# Patient Record
Sex: Female | Born: 1935
Health system: Southern US, Community
[De-identification: ages and names within clinical notes are randomized; demographics above are authoritative.]

## PROBLEM LIST (undated history)

## (undated) DIAGNOSIS — E785 Hyperlipidemia, unspecified: Secondary | ICD-10-CM

## (undated) DIAGNOSIS — K5790 Diverticulosis of intestine, part unspecified, without perforation or abscess without bleeding: Secondary | ICD-10-CM

## (undated) DIAGNOSIS — F419 Anxiety disorder, unspecified: Secondary | ICD-10-CM

## (undated) DIAGNOSIS — K222 Esophageal obstruction: Secondary | ICD-10-CM

## (undated) DIAGNOSIS — R112 Nausea with vomiting, unspecified: Secondary | ICD-10-CM

## (undated) DIAGNOSIS — F32A Depression, unspecified: Secondary | ICD-10-CM

## (undated) DIAGNOSIS — K7689 Other specified diseases of liver: Secondary | ICD-10-CM

## (undated) DIAGNOSIS — Z86010 Personal history of colonic polyps: Secondary | ICD-10-CM

## (undated) DIAGNOSIS — K279 Peptic ulcer, site unspecified, unspecified as acute or chronic, without hemorrhage or perforation: Secondary | ICD-10-CM

## (undated) DIAGNOSIS — T7840XA Allergy, unspecified, initial encounter: Secondary | ICD-10-CM

## (undated) DIAGNOSIS — R519 Headache, unspecified: Secondary | ICD-10-CM

## (undated) DIAGNOSIS — R51 Headache: Secondary | ICD-10-CM

## (undated) DIAGNOSIS — K589 Irritable bowel syndrome without diarrhea: Secondary | ICD-10-CM

## (undated) DIAGNOSIS — IMO0001 Reserved for inherently not codable concepts without codable children: Secondary | ICD-10-CM

## (undated) DIAGNOSIS — J449 Chronic obstructive pulmonary disease, unspecified: Secondary | ICD-10-CM

## (undated) DIAGNOSIS — E538 Deficiency of other specified B group vitamins: Secondary | ICD-10-CM

## (undated) DIAGNOSIS — Z9889 Other specified postprocedural states: Secondary | ICD-10-CM

## (undated) DIAGNOSIS — I350 Nonrheumatic aortic (valve) stenosis: Secondary | ICD-10-CM

## (undated) DIAGNOSIS — D649 Anemia, unspecified: Secondary | ICD-10-CM

## (undated) DIAGNOSIS — K449 Diaphragmatic hernia without obstruction or gangrene: Secondary | ICD-10-CM

## (undated) DIAGNOSIS — F329 Major depressive disorder, single episode, unspecified: Secondary | ICD-10-CM

## (undated) DIAGNOSIS — R7611 Nonspecific reaction to tuberculin skin test without active tuberculosis: Secondary | ICD-10-CM

## (undated) DIAGNOSIS — M1990 Unspecified osteoarthritis, unspecified site: Secondary | ICD-10-CM

## (undated) DIAGNOSIS — F1020 Alcohol dependence, uncomplicated: Secondary | ICD-10-CM

## (undated) HISTORY — DX: Hyperlipidemia, unspecified: E78.5

## (undated) HISTORY — DX: Nonrheumatic aortic (valve) stenosis: I35.0

## (undated) HISTORY — DX: Nonspecific reaction to tuberculin skin test without active tuberculosis: R76.11

## (undated) HISTORY — DX: Diverticulosis of intestine, part unspecified, without perforation or abscess without bleeding: K57.90

## (undated) HISTORY — PX: BREAST BIOPSY: SHX20

## (undated) HISTORY — PX: VAGOTOMY AND PYLOROPLASTY: SHX831

## (undated) HISTORY — PX: ABDOMINAL HYSTERECTOMY: SHX81

## (undated) HISTORY — PX: UPPER GI ENDOSCOPY: SHX6162

## (undated) HISTORY — PX: APPENDECTOMY: SHX54

## (undated) HISTORY — DX: Diaphragmatic hernia without obstruction or gangrene: K44.9

## (undated) HISTORY — PX: BARTHOLIN GLAND CYST EXCISION: SHX565

## (undated) HISTORY — DX: Anemia, unspecified: D64.9

## (undated) HISTORY — DX: Other specified diseases of liver: K76.89

## (undated) HISTORY — DX: Esophageal obstruction: K22.2

## (undated) HISTORY — PX: COLONOSCOPY W/ POLYPECTOMY: SHX1380

## (undated) HISTORY — PX: CATARACT EXTRACTION W/ INTRAOCULAR LENS  IMPLANT, BILATERAL: SHX1307

## (undated) HISTORY — DX: Deficiency of other specified B group vitamins: E53.8

## (undated) HISTORY — DX: Allergy, unspecified, initial encounter: T78.40XA

## (undated) HISTORY — DX: Personal history of colonic polyps: Z86.010

## (undated) HISTORY — PX: COLONOSCOPY: SHX174

## (undated) HISTORY — DX: Irritable bowel syndrome, unspecified: K58.9

## (undated) HISTORY — DX: Peptic ulcer, site unspecified, unspecified as acute or chronic, without hemorrhage or perforation: K27.9

---

## 1983-06-17 DIAGNOSIS — K449 Diaphragmatic hernia without obstruction or gangrene: Secondary | ICD-10-CM

## 1983-06-17 HISTORY — DX: Diaphragmatic hernia without obstruction or gangrene: K44.9

## 1998-01-02 ENCOUNTER — Other Ambulatory Visit: Admission: RE | Admit: 1998-01-02 | Discharge: 1998-01-02 | Payer: Self-pay | Admitting: Internal Medicine

## 1998-01-12 ENCOUNTER — Other Ambulatory Visit: Admission: RE | Admit: 1998-01-12 | Discharge: 1998-01-12 | Payer: Self-pay | Admitting: Internal Medicine

## 1999-02-28 ENCOUNTER — Other Ambulatory Visit: Admission: RE | Admit: 1999-02-28 | Discharge: 1999-02-28 | Payer: Self-pay | Admitting: Internal Medicine

## 2000-02-24 ENCOUNTER — Other Ambulatory Visit: Admission: RE | Admit: 2000-02-24 | Discharge: 2000-02-24 | Payer: Self-pay | Admitting: Internal Medicine

## 2000-09-24 ENCOUNTER — Encounter: Admission: RE | Admit: 2000-09-24 | Discharge: 2000-12-23 | Payer: Self-pay | Admitting: Internal Medicine

## 2001-03-08 ENCOUNTER — Other Ambulatory Visit: Admission: RE | Admit: 2001-03-08 | Discharge: 2001-03-08 | Payer: Self-pay | Admitting: Internal Medicine

## 2002-03-21 ENCOUNTER — Encounter: Admission: RE | Admit: 2002-03-21 | Discharge: 2002-03-21 | Payer: Self-pay | Admitting: Internal Medicine

## 2002-03-21 ENCOUNTER — Encounter: Payer: Self-pay | Admitting: Internal Medicine

## 2003-04-10 ENCOUNTER — Other Ambulatory Visit: Admission: RE | Admit: 2003-04-10 | Discharge: 2003-04-10 | Payer: Self-pay | Admitting: Internal Medicine

## 2003-07-19 ENCOUNTER — Ambulatory Visit (HOSPITAL_COMMUNITY): Admission: RE | Admit: 2003-07-19 | Discharge: 2003-07-19 | Payer: Self-pay | Admitting: Internal Medicine

## 2003-10-19 ENCOUNTER — Encounter: Admission: RE | Admit: 2003-10-19 | Discharge: 2003-10-19 | Payer: Self-pay | Admitting: Internal Medicine

## 2004-06-16 DIAGNOSIS — Z860101 Personal history of adenomatous and serrated colon polyps: Secondary | ICD-10-CM

## 2004-06-16 DIAGNOSIS — Z86010 Personal history of colonic polyps: Secondary | ICD-10-CM

## 2004-06-16 HISTORY — DX: Personal history of colonic polyps: Z86.010

## 2004-06-16 HISTORY — DX: Personal history of adenomatous and serrated colon polyps: Z86.0101

## 2004-08-06 ENCOUNTER — Ambulatory Visit (HOSPITAL_COMMUNITY): Admission: RE | Admit: 2004-08-06 | Discharge: 2004-08-06 | Payer: Self-pay | Admitting: Internal Medicine

## 2004-09-11 ENCOUNTER — Ambulatory Visit: Payer: Self-pay | Admitting: Internal Medicine

## 2004-09-20 ENCOUNTER — Ambulatory Visit: Payer: Self-pay | Admitting: Internal Medicine

## 2005-08-26 ENCOUNTER — Ambulatory Visit (HOSPITAL_COMMUNITY): Admission: RE | Admit: 2005-08-26 | Discharge: 2005-08-26 | Payer: Self-pay | Admitting: Internal Medicine

## 2005-11-03 ENCOUNTER — Ambulatory Visit: Payer: Self-pay | Admitting: Internal Medicine

## 2005-11-19 ENCOUNTER — Ambulatory Visit: Payer: Self-pay | Admitting: Internal Medicine

## 2006-06-12 ENCOUNTER — Other Ambulatory Visit: Admission: RE | Admit: 2006-06-12 | Discharge: 2006-06-12 | Payer: Self-pay | Admitting: Internal Medicine

## 2006-09-10 ENCOUNTER — Ambulatory Visit (HOSPITAL_COMMUNITY): Admission: RE | Admit: 2006-09-10 | Discharge: 2006-09-10 | Payer: Self-pay | Admitting: Internal Medicine

## 2006-09-22 ENCOUNTER — Encounter (INDEPENDENT_AMBULATORY_CARE_PROVIDER_SITE_OTHER): Payer: Self-pay | Admitting: Specialist

## 2006-09-22 ENCOUNTER — Encounter: Admission: RE | Admit: 2006-09-22 | Discharge: 2006-09-22 | Payer: Self-pay | Admitting: Internal Medicine

## 2006-09-22 IMAGING — MG MM BREAST STEREO BIOPSY*R*
3 series · 3 of 3 positions shown · non-contrast
Comparison: none

MAMMO BREAST SURGICAL SPECIMEN

STEREO BIOPSY *R* BREAST
VACUUM-ASSISTED DIRECTIONAL LARGE CORE NEEDLE BIOPSY OF THE RIGHT BREAST WITH STEREOTACTIC GUIDANCE
AND SPECIMEN RADIOGRAPH:
CLINICAL DATA: Microcalcifications at 6 o'clock posteriorly on the right.

[R CC (1 of 2)]
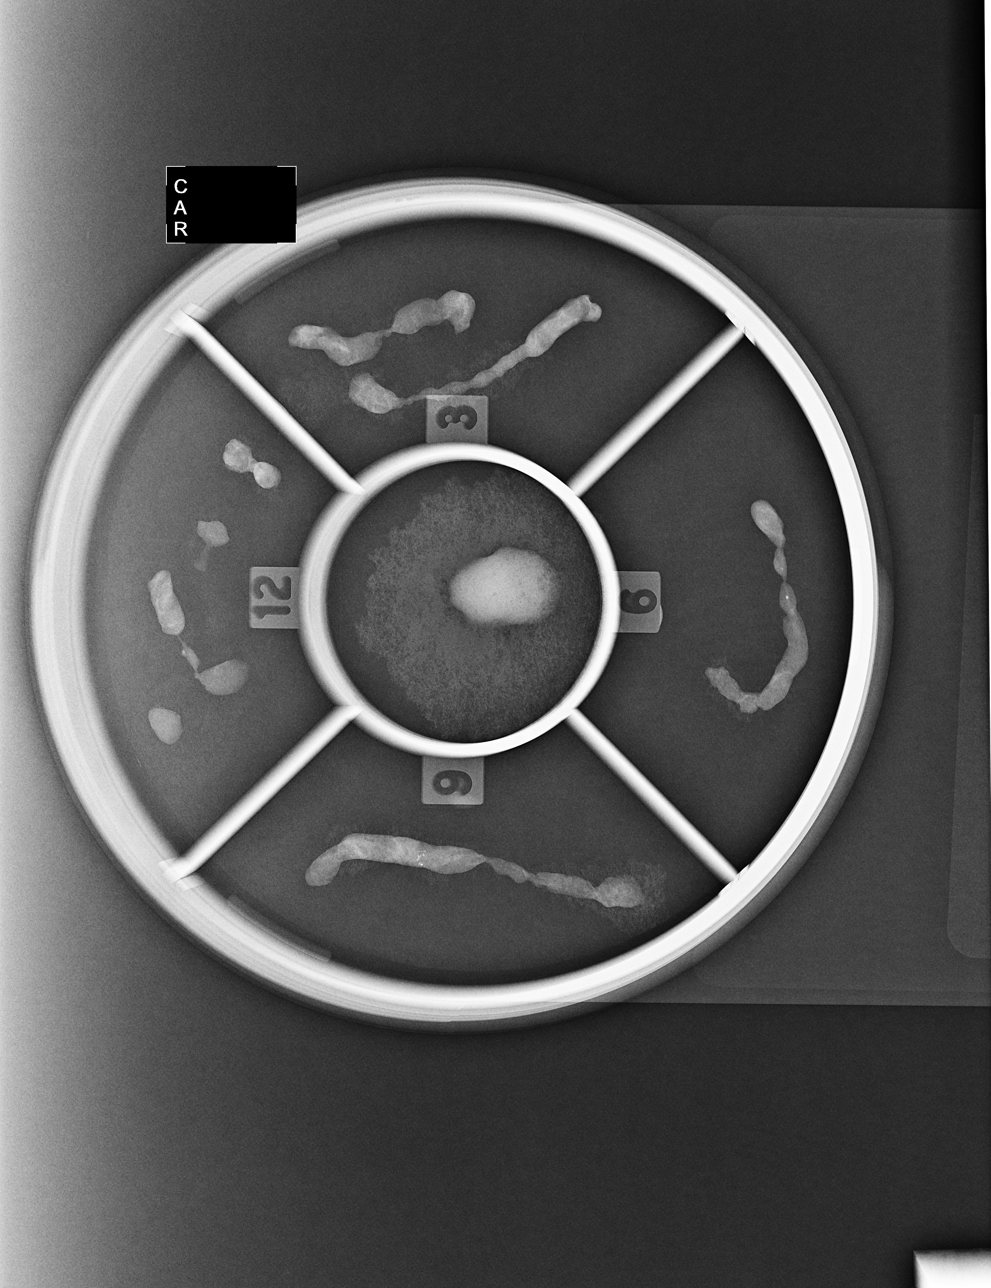

[R CC (2 of 2)]
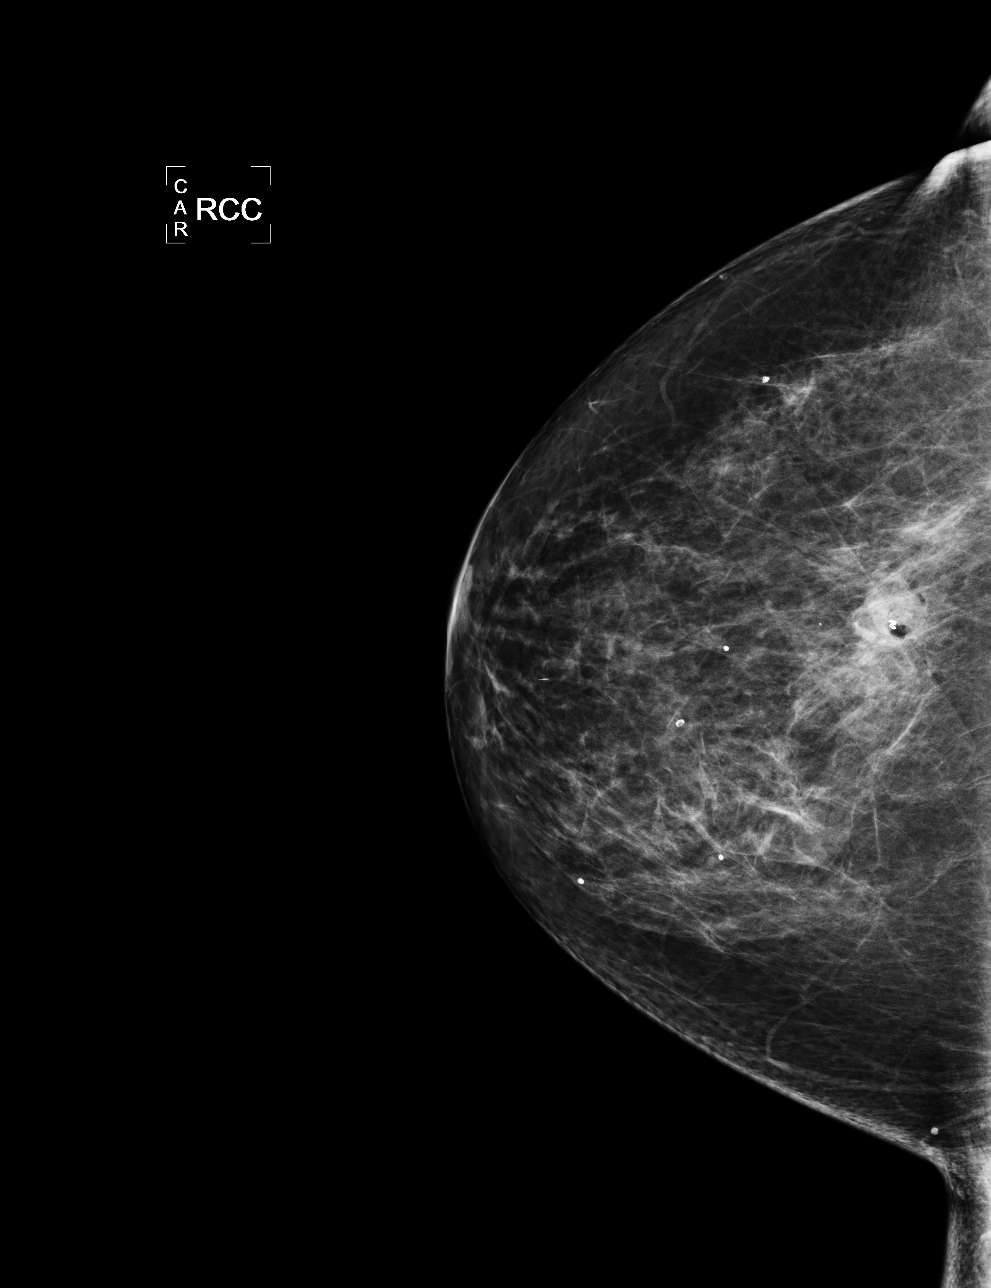

[R ML]
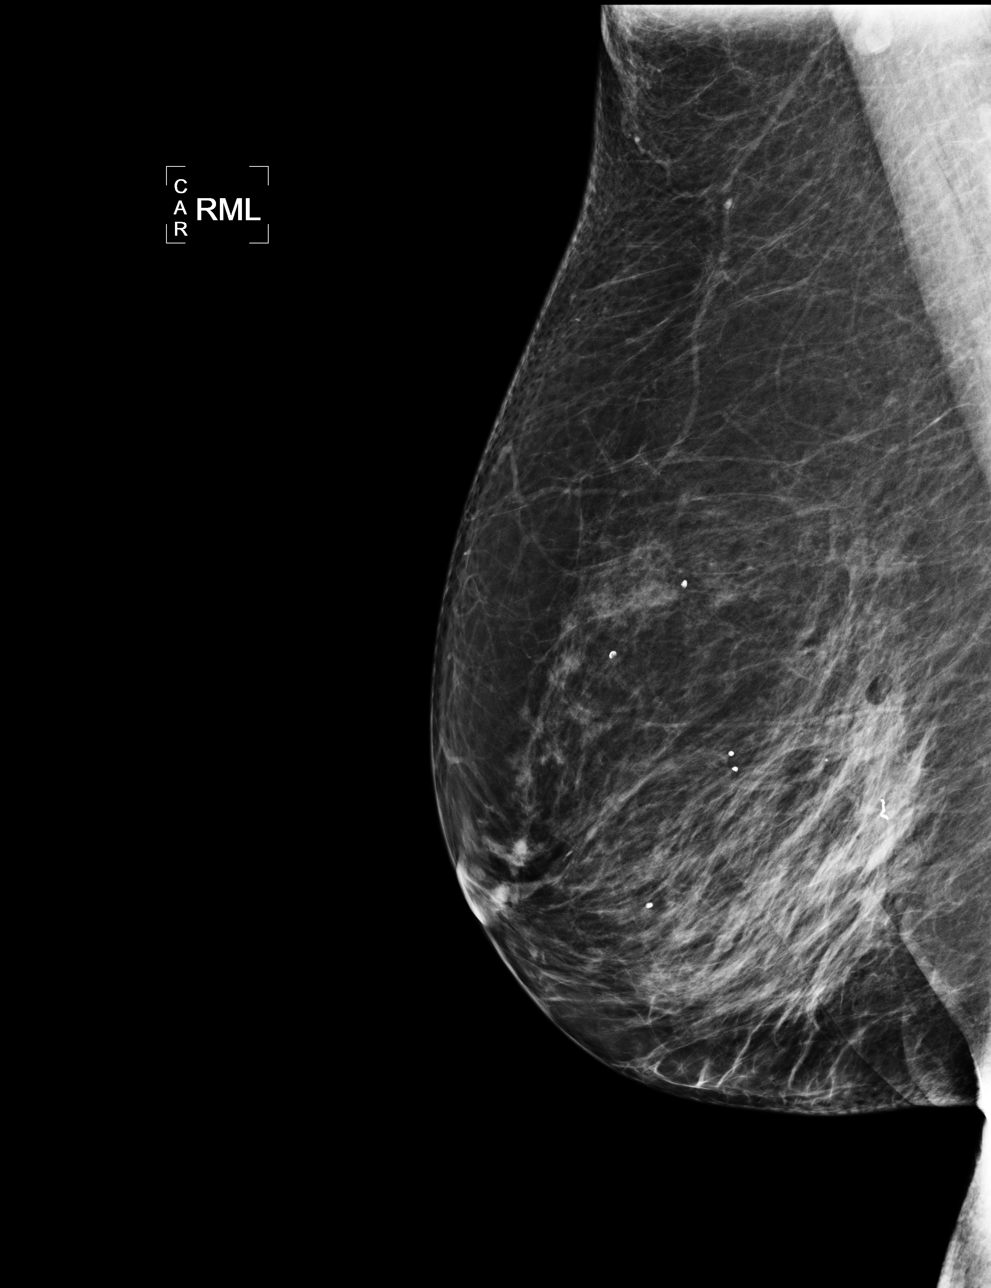

[3 of 3 positions shown; findings below may reference images not displayed]

The procedure for stereotactic core needle biopsy of the breast was discussed with the patient 
along with its risks, benefits, alternatives and possible outcomes.  The need for rebiopsy should 
results prove inconclusive or precancerous was discussed.  The patient gave written informed 
consent for the procedure.

The calcifications were localized with stereotactic images in the caudocranial projection.  Using 
sterile technique and 2% Xylocaine without epinephrine  and 1% Xylocaine with epinephrine for local
anesthesia, the 11-gauge Mammotome probe was positioned adjacent to the calcifications.  Six core 
specimens were obtained and radiographed.  There were calcifications within three of the cores.  A 
MammoMark clip was placed to mark the biopsy site.  Postprocedural CC and ML mammographic views 
demonstrated appropriate positioning of the clip and removal of some of the microcalcifications.

The patient tolerated the procedure well.  She was given my pager number in case there are 
questions or concerns this evening.
IMPRESSION: A cluster of calcifications in the 6 o'clock position of the right breast posteriorly was biopsied 
using stereotactic guidance and the 11-gauge Mammotome probe.  Pathologic results are pending.

PATHOLOGY RESULTS: HIGH RISK
High risk atypical ductal hyperplasia.
Benign columnar alteration w/prominent apical snouts and sclerosing papilloma.

Pathologic diagnosis reveals columnar alteration with prominent apical snouts and secretions 
(CAPSS) with focal cytologic atypia.  A sclerosing ductal papilloma with hyperplasia and 
microcalcifications is also noted.  Given the atypia, excisional biopsy is suggested.  Surgical 
consultation was arranged with Dr. RODAYNA for [DATE] at [DATE] a.m.  Results were discussed 
with the patient and her husband.  She has experienced some pain overnight but upon examination of 
her breast no sign of hematoma or infection is identified.

Surgical consultation of the right breast.
,

## 2006-10-20 ENCOUNTER — Encounter: Admission: RE | Admit: 2006-10-20 | Discharge: 2006-10-20 | Payer: Self-pay | Admitting: Surgery

## 2006-10-23 ENCOUNTER — Encounter: Admission: RE | Admit: 2006-10-23 | Discharge: 2006-10-23 | Payer: Self-pay | Admitting: Surgery

## 2006-10-23 ENCOUNTER — Ambulatory Visit (HOSPITAL_BASED_OUTPATIENT_CLINIC_OR_DEPARTMENT_OTHER): Admission: RE | Admit: 2006-10-23 | Discharge: 2006-10-23 | Payer: Self-pay | Admitting: Surgery

## 2006-10-23 ENCOUNTER — Encounter (INDEPENDENT_AMBULATORY_CARE_PROVIDER_SITE_OTHER): Payer: Self-pay | Admitting: Specialist

## 2007-06-23 ENCOUNTER — Encounter: Admission: RE | Admit: 2007-06-23 | Discharge: 2007-06-23 | Payer: Self-pay | Admitting: Internal Medicine

## 2007-10-06 ENCOUNTER — Encounter: Admission: RE | Admit: 2007-10-06 | Discharge: 2007-10-06 | Payer: Self-pay | Admitting: Internal Medicine

## 2008-03-27 ENCOUNTER — Ambulatory Visit: Payer: Self-pay | Admitting: Internal Medicine

## 2008-06-20 ENCOUNTER — Other Ambulatory Visit: Admission: RE | Admit: 2008-06-20 | Discharge: 2008-06-20 | Payer: Self-pay | Admitting: Internal Medicine

## 2008-06-20 ENCOUNTER — Ambulatory Visit: Payer: Self-pay | Admitting: Internal Medicine

## 2008-10-02 ENCOUNTER — Ambulatory Visit: Payer: Self-pay | Admitting: Internal Medicine

## 2008-10-04 ENCOUNTER — Encounter (INDEPENDENT_AMBULATORY_CARE_PROVIDER_SITE_OTHER): Payer: Self-pay | Admitting: *Deleted

## 2008-10-06 ENCOUNTER — Encounter: Admission: RE | Admit: 2008-10-06 | Discharge: 2008-10-06 | Payer: Self-pay | Admitting: Internal Medicine

## 2008-10-30 ENCOUNTER — Ambulatory Visit: Payer: Self-pay | Admitting: Internal Medicine

## 2008-11-17 ENCOUNTER — Ambulatory Visit: Payer: Self-pay | Admitting: Internal Medicine

## 2008-12-22 ENCOUNTER — Ambulatory Visit: Payer: Self-pay | Admitting: Internal Medicine

## 2009-05-07 ENCOUNTER — Ambulatory Visit: Payer: Self-pay | Admitting: Internal Medicine

## 2009-06-22 ENCOUNTER — Ambulatory Visit: Payer: Self-pay | Admitting: Internal Medicine

## 2009-10-09 ENCOUNTER — Encounter: Admission: RE | Admit: 2009-10-09 | Discharge: 2009-10-09 | Payer: Self-pay | Admitting: Internal Medicine

## 2009-11-19 ENCOUNTER — Ambulatory Visit: Payer: Self-pay | Admitting: Internal Medicine

## 2010-01-29 ENCOUNTER — Ambulatory Visit: Payer: Self-pay | Admitting: Internal Medicine

## 2010-01-30 ENCOUNTER — Encounter: Admission: RE | Admit: 2010-01-30 | Discharge: 2010-01-30 | Payer: Self-pay | Admitting: Internal Medicine

## 2010-01-30 DIAGNOSIS — K7689 Other specified diseases of liver: Secondary | ICD-10-CM

## 2010-01-30 HISTORY — DX: Other specified diseases of liver: K76.89

## 2010-03-13 ENCOUNTER — Encounter
Admission: RE | Admit: 2010-03-13 | Discharge: 2010-03-13 | Payer: Self-pay | Admitting: Physical Medicine and Rehabilitation

## 2010-06-25 ENCOUNTER — Ambulatory Visit
Admission: RE | Admit: 2010-06-25 | Discharge: 2010-06-25 | Payer: Self-pay | Source: Home / Self Care | Attending: Internal Medicine | Admitting: Internal Medicine

## 2010-07-01 ENCOUNTER — Ambulatory Visit
Admission: RE | Admit: 2010-07-01 | Discharge: 2010-07-01 | Payer: Self-pay | Source: Home / Self Care | Attending: Internal Medicine | Admitting: Internal Medicine

## 2010-07-08 ENCOUNTER — Ambulatory Visit
Admission: RE | Admit: 2010-07-08 | Discharge: 2010-07-08 | Payer: Self-pay | Source: Home / Self Care | Attending: Internal Medicine | Admitting: Internal Medicine

## 2010-07-15 ENCOUNTER — Ambulatory Visit
Admission: RE | Admit: 2010-07-15 | Discharge: 2010-07-15 | Payer: Self-pay | Source: Home / Self Care | Attending: Internal Medicine | Admitting: Internal Medicine

## 2010-07-22 ENCOUNTER — Ambulatory Visit (INDEPENDENT_AMBULATORY_CARE_PROVIDER_SITE_OTHER): Payer: Medicare Other | Admitting: Internal Medicine

## 2010-07-22 ENCOUNTER — Other Ambulatory Visit: Payer: Self-pay

## 2010-07-22 DIAGNOSIS — E538 Deficiency of other specified B group vitamins: Secondary | ICD-10-CM

## 2010-08-20 ENCOUNTER — Encounter (INDEPENDENT_AMBULATORY_CARE_PROVIDER_SITE_OTHER): Payer: Medicare Other | Admitting: Internal Medicine

## 2010-08-20 DIAGNOSIS — J449 Chronic obstructive pulmonary disease, unspecified: Secondary | ICD-10-CM

## 2010-08-20 DIAGNOSIS — J209 Acute bronchitis, unspecified: Secondary | ICD-10-CM

## 2010-09-18 ENCOUNTER — Other Ambulatory Visit: Payer: Self-pay | Admitting: Internal Medicine

## 2010-09-18 DIAGNOSIS — Z1231 Encounter for screening mammogram for malignant neoplasm of breast: Secondary | ICD-10-CM

## 2010-10-17 ENCOUNTER — Ambulatory Visit (HOSPITAL_COMMUNITY)
Admission: RE | Admit: 2010-10-17 | Discharge: 2010-10-17 | Disposition: A | Discharge status: Home / Self Care | Payer: Medicare Other | Source: Ambulatory Visit | Attending: Internal Medicine | Admitting: Internal Medicine

## 2010-10-17 DIAGNOSIS — Z1231 Encounter for screening mammogram for malignant neoplasm of breast: Secondary | ICD-10-CM | POA: Insufficient documentation

## 2010-10-17 LAB — HM MAMMOGRAPHY

## 2010-10-29 NOTE — Op Note (Signed)
NAMECLEOTILDE, SPADACCINI             ACCOUNT NO.:  [id number]   MEDICAL RECORD NO.:  [id number]          PATIENT TYPE:  AMB   LOCATION:  DSC                          FACILITY:  [facility]   PHYSICIAN:  Currie Paris, M.D.DATE OF BIRTH:  12-12-1928   DATE OF PROCEDURE:  10/23/2006  DATE OF DISCHARGE:                               OPERATIVE REPORT   OFFICE MEDICAL RECORD NUMBER:  [id number].   PREOPERATIVE DIAGNOSIS:  Right breast calcifications with atypical  ductal hyperplasia on biopsy.   POSTOPERATIVE DIAGNOSIS:  Right breast calcifications with atypical  ductal hyperplasia on biopsy.   OPERATION:  Needle guided excision of some right breast calcifications.   SURGEON:  Currie Paris, M.D.   ANESTHESIA:  MAC.   CLINICAL HISTORY:  Mrs. Kendra is a 78-year lady who recently was found  to have abnormality on mammography and a biopsy showed some ADH.  Excisional biopsy of the area was recommended to clarify the diagnosis  and be sure this was not early DCIS.   DESCRIPTION OF PROCEDURE:  The patient was seen in the holding area and  she had no further questions.  We both initialed the right breast as the  operative side.  Her guidewire had already been placed and I reviewed  the mammogram films.   The patient was then taken to the operating room and after satisfactory  IV sedation, the right breast was prepped and draped.  The time-out  occurred.   A combination of 1% Xylocaine mixed equally with 0.5% plain Marcaine was  used for local anesthesia.  The guidewire entered at just about the  inframammary fold and tracked superiorly.  It entered about the 5  o'clock or 5:30 position.   I estimated where the clip was going to be and made a radial incision  over the guidewire tract.  I did not extend it all the way down to the  guidewire entry site so I could keep the incision out of the  inframammary fold.   I then manipulated the guidewire into the incision.  Then using  cautery,  I took a wide cylinder of tissue going down almost to the chest wall  leaving just a little bit of the fatty tissue along the chest wall and  taking out the entire amount of tissue around the guidewire up to the  tip, which was really subareolar in  location.   I put some sutures on to orient the specimens and sent the tissue for  specimen mammography.   I made sure everything was dry and then closed with some 3-0 Vicryl  followed by 4-0 Monocryl subcuticular plus Dermabond.   Dr. Azucena Kuba reported that the clip was in the specimen mammogram.   This concluded the procedure.  She tolerated it nicely.  There were no  operative complications.  All counts were correct.      Currie Paris, M.D.  Electronically Signed     CJS/MEDQ  D:  10/23/2006  T:  10/23/2006  Job:  [id number]

## 2010-12-20 ENCOUNTER — Encounter: Payer: Self-pay | Admitting: Internal Medicine

## 2010-12-23 ENCOUNTER — Other Ambulatory Visit: Payer: Medicare Other | Admitting: Internal Medicine

## 2010-12-23 DIAGNOSIS — E785 Hyperlipidemia, unspecified: Secondary | ICD-10-CM

## 2010-12-23 LAB — HEPATIC FUNCTION PANEL
AST: 31 U/L (ref 0–37)
Albumin: 3.8 g/dL (ref 3.5–5.2)
Indirect Bilirubin: 0.3 mg/dL (ref 0.0–0.9)
Total Protein: 6.3 g/dL (ref 6.0–8.3)

## 2010-12-23 LAB — LIPID PANEL: Cholesterol: 152 mg/dL (ref 0–200)

## 2010-12-24 ENCOUNTER — Ambulatory Visit (INDEPENDENT_AMBULATORY_CARE_PROVIDER_SITE_OTHER): Payer: Medicare Other | Admitting: Internal Medicine

## 2010-12-24 ENCOUNTER — Encounter: Payer: Self-pay | Admitting: Internal Medicine

## 2010-12-24 VITALS — BP 122/70 | HR 72 | Temp 97.2°F | Ht 61.5 in | Wt 123.0 lb

## 2010-12-24 DIAGNOSIS — F419 Anxiety disorder, unspecified: Secondary | ICD-10-CM

## 2010-12-24 DIAGNOSIS — E785 Hyperlipidemia, unspecified: Secondary | ICD-10-CM

## 2010-12-24 DIAGNOSIS — K219 Gastro-esophageal reflux disease without esophagitis: Secondary | ICD-10-CM

## 2010-12-24 DIAGNOSIS — F32A Depression, unspecified: Secondary | ICD-10-CM

## 2010-12-24 DIAGNOSIS — F341 Dysthymic disorder: Secondary | ICD-10-CM

## 2010-12-24 DIAGNOSIS — M549 Dorsalgia, unspecified: Secondary | ICD-10-CM

## 2010-12-24 DIAGNOSIS — G8929 Other chronic pain: Secondary | ICD-10-CM

## 2010-12-24 NOTE — Progress Notes (Signed)
  Subjective:    Patient ID: Katherine [name], female    DOB: November 22, 1928, 76 y.o.   MRN: [id number]  HPI  pleasant white female long-standing recovering alcoholic in today for followup of hyperlipidemia, back pain, depression, GE reflux. Recent lipid panel liver functions reviewed with her on  Lipitor 10 mg daily which she tolerates well. These lab results are entirely within normal limits. She's been having recurrent back issues. She took an epidural steroid injection per Dr. Nickola Major. The results of that lasted quite as long. Says she gets anxious when her back is hurting. She is considering having some implantable device inserted in her back in [place]. Has tramadol to take for pain which she seldom takes. I think the recurrent back pain this made her a bit more depressed. Anxiety is also been an issue. She continues to be very active in [employer] and does a lot of sponsoring,etc. Former Veterinary surgeon at [employer].    Review of Systems     Objective:   Physical Exam neck supple without carotid bruits. No thyromegaly. Chest is clear to auscultation. Cardiac exam regular rate and rhythm normal S1/S2. Extremities without edema.        Assessment & Plan:  Hyperlipidemia-stable on Lipitor 10 mg daily. New prescription with when necessary 1 year refills written today  Anxiety/depression: Try Lexapro 10 mg daily generic #30 with refills. If not better in 4 weeks is to call back. Previously has tried Celexa, Wellbutrin, Cymbalta.  Chronic back pain: Encouraged to take tramadol if back is hurting. I think she will be sparing with the medication. Continue to see Dr. Nickola Major in [place] for conservative treatment with pain management consisting of epidural steroid injections, etc. Possible implantable device to be put in back in the future.  GE reflux-stable on PPI.  Schedule physical examination in January 2013

## 2010-12-24 NOTE — Patient Instructions (Signed)
Please discontinue pristiq and start Lexapro 10 mg daily. Call if anxiety/depression not better in 4 weeks. Schedule appointment for physical examination January 2013. Continue Lipitor 10 mg daily. New prescription written.

## 2010-12-26 NOTE — Procedures (Signed)
Katherine [name], Katherine [name]             ACCOUNT NO.:  [id number]  MEDICAL RECORD NO.:  [id number]          PATIENT TYPE:  OUT  LOCATION:  SLEEP CENTER                 [facility]  PHYSICIAN:  Sakiyah Shur D. Maple Hudson, MD, FCCP, FACPDATE OF BIRTH:  May 29, 1931  DATE OF STUDY:  12/20/2010                           NOCTURNAL POLYSOMNOGRAM  REFERRING PHYSICIAN:  Zelphia Cairo. Alto Denver, M.D.  INDICATION FOR STUDY:  Hypersomnia with sleep apnea.  EPWORTH SLEEPINESS SCORE:  3/24, BMI 33.7.  Weight 235 pounds, height 70 inches.  Neck 18 inches.  MEDICATIONS:  Home medications are charted and reviewed.  SLEEP ARCHITECTURE:  Total sleep time 307 minutes with sleep efficiency 83.2%.  Stage I was 11.9%, stage II 73.8%, stage III absent, REM 14.3% of total sleep time.  Sleep latency 21.5 minutes, REM latency 87.5 minutes, awake after sleep onset 40.5 minutes, arousal index 4.3. Bedtime medication:  None.  RESPIRATORY DATA:  Apnea/hypopnea index (AHI) 10.4 per hour.  A total of 53 events was scored including 4 obstructive apneas, 6 central apneas, 43 hypopneas.  Events were seen in all sleep positions especially while supine and in REM.  REM AHI 25.9 per hour.  There were insufficient early events to meet protocol requirements for initiation of CPAP titration by split protocol on this study night.  OXYGEN DATA:  Moderately loud snoring with oxygen desaturation to a nadir of 79% and a mean oxygen saturation through the study of 91.4% on room air.  CARDIAC DATA:  Normal sinus rhythm.  MOVEMENT-PARASOMNIA:  Frequent limb jerks but with little recorded effect on sleep pattern.  A total of 137 limb jerks were counted but none were associated with sleep disturbance or awakening.  Bathroom x1.  IMPRESSIONS-RECOMMENDATIONS: 1. Mild central and obstructive sleep apnea/hypopnea syndrome, AHI     10.4 per hour with nonpositional events, moderately loud snoring     and oxygen desaturation to a nadir of 79% with mean  of 91.4% on     room air. 2. There were insufficient early events to permit application of CPAP     titration by split protocol on this study night.  Since a     predominance of events were recorded as central apneas, tending to     have less response to     CPAP, it is not clear what impact CPAP therapy might have.     Consider return for a dedicated CPAP titration study if clinically     appropriate.     Jaxiel Kines D. Maple Hudson, MD, [facility], FACP Diplomate, Biomedical engineer of Sleep Medicine Electronically Signed    CDY/MEDQ  D:  12/21/2010 13:57:27  T:  12/21/2010 22:58:32  Job:  [id number]

## 2011-02-03 ENCOUNTER — Other Ambulatory Visit: Payer: Self-pay | Admitting: *Deleted

## 2011-02-03 MED ORDER — CITALOPRAM HYDROBROMIDE 20 MG PO TABS: 20.0000 mg | ORAL_TABLET | Freq: Every day | ORAL | Status: DC

## 2011-04-15 ENCOUNTER — Encounter: Payer: Self-pay | Admitting: Internal Medicine

## 2011-05-20 ENCOUNTER — Encounter: Payer: Self-pay | Admitting: Internal Medicine

## 2011-05-20 ENCOUNTER — Ambulatory Visit (INDEPENDENT_AMBULATORY_CARE_PROVIDER_SITE_OTHER): Payer: Medicare Other | Admitting: Internal Medicine

## 2011-05-20 VITALS — BP 128/58 | HR 76 | Temp 97.5°F | Wt 122.0 lb

## 2011-05-20 DIAGNOSIS — R5383 Other fatigue: Secondary | ICD-10-CM

## 2011-05-20 DIAGNOSIS — F329 Major depressive disorder, single episode, unspecified: Secondary | ICD-10-CM

## 2011-05-20 DIAGNOSIS — R5381 Other malaise: Secondary | ICD-10-CM

## 2011-05-20 DIAGNOSIS — E785 Hyperlipidemia, unspecified: Secondary | ICD-10-CM

## 2011-05-20 NOTE — Progress Notes (Signed)
  Subjective:    Patient ID: Katherine [name], female    DOB: 08/16/1928, 77 y.o.   MRN: [id number]  HPI patient has been on Celexa for depression. She has been on various antidepressants throughout the years. Most recently I prescribed Lexapro but she said it was much too expensive and we used Celexa instead. She is now complaining that her affect is flat and she really doesn't feel motivated to do much of anything. She think she is depressed. She is a recovering alcoholic for many years. 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 It seems to me that she is suffering more from a flat affect and from depression. She says her husband doesn't think she is really depressed. Just doesn't seem to be motivated. Says she has good intentions about getting things done about the house but just doesn't get around to doing those things. Just not interested in doing much and doesn't have much energy    Review of Systems     Objective:   Physical Exam chest clear, cardiac exam regular rate and rhythm        Assessment & Plan:  Longer cover/and with patient about these issues. She has a physical exam coming up in mid January. She wants to try Zoloft which worked well for her daughter. Change from Lexapro to Zoloft 100 mg daily and followup with her in mid-January. Explained to her that sometimes affect can become flat on SSRI after taking it for an extended period of time and sometimes we would add  something such as Wellbutrin to increase dopamine in the brain which would combat lack of motivation. Written prescription for Zoloft 100 mg generic #30 one by mouth daily but suggest she start with maybe a half tablet for a few days. Can go ahead and discontinue Celexa and start immediately on Zoloft. Followup in January. She does occasionally take tramadol for back pain and some Tylenol. Has had epidural steroid injections per physiatrist  in [place].

## 2011-05-20 NOTE — Patient Instructions (Signed)
Discontinue Celexa generic and start Zoloft 100 mg one half tablet daily for 4 or 5 days and then increase 1 tablet daily. Followup with physical exam in mid January.

## 2011-07-03 ENCOUNTER — Other Ambulatory Visit: Payer: Medicare Other | Admitting: Internal Medicine

## 2011-07-03 DIAGNOSIS — E559 Vitamin D deficiency, unspecified: Secondary | ICD-10-CM

## 2011-07-03 DIAGNOSIS — E785 Hyperlipidemia, unspecified: Secondary | ICD-10-CM

## 2011-07-03 DIAGNOSIS — J449 Chronic obstructive pulmonary disease, unspecified: Secondary | ICD-10-CM

## 2011-07-04 ENCOUNTER — Ambulatory Visit (INDEPENDENT_AMBULATORY_CARE_PROVIDER_SITE_OTHER): Payer: Medicare Other | Admitting: Internal Medicine

## 2011-07-04 ENCOUNTER — Encounter: Payer: Self-pay | Admitting: Internal Medicine

## 2011-07-04 VITALS — BP 140/68 | HR 80 | Temp 97.3°F | Ht 61.5 in | Wt 120.5 lb

## 2011-07-04 DIAGNOSIS — Z8711 Personal history of peptic ulcer disease: Secondary | ICD-10-CM

## 2011-07-04 DIAGNOSIS — F329 Major depressive disorder, single episode, unspecified: Secondary | ICD-10-CM

## 2011-07-04 DIAGNOSIS — E785 Hyperlipidemia, unspecified: Secondary | ICD-10-CM

## 2011-07-04 DIAGNOSIS — Z87898 Personal history of other specified conditions: Secondary | ICD-10-CM

## 2011-07-04 DIAGNOSIS — Z8639 Personal history of other endocrine, nutritional and metabolic disease: Secondary | ICD-10-CM

## 2011-07-04 LAB — COMPREHENSIVE METABOLIC PANEL
ALT: 9 U/L (ref 0–35)
AST: 19 U/L (ref 0–37)
Alkaline Phosphatase: 69 U/L (ref 39–117)
CO2: 26 mEq/L (ref 19–32)
Creat: 1.02 mg/dL (ref 0.50–1.10)
Sodium: 142 mEq/L (ref 135–145)
Total Bilirubin: 0.4 mg/dL (ref 0.3–1.2)
Total Protein: 6.4 g/dL (ref 6.0–8.3)

## 2011-07-04 LAB — LIPID PANEL
HDL: 60 mg/dL (ref >39)
Total CHOL/HDL Ratio: 2.4 Ratio
VLDL: 22 mg/dL (ref 0–40)

## 2011-07-04 LAB — VITAMIN D 25 HYDROXY (VIT D DEFICIENCY, FRACTURES): Vit D, 25-Hydroxy: 32 ng/mL (ref 30–89)

## 2011-07-04 LAB — CBC WITH DIFFERENTIAL/PLATELET
Basophils Absolute: 0 10*3/uL (ref 0.0–0.1)
Basophils Relative: 1 % (ref 0–1)
Eosinophils Absolute: 0.1 10*3/uL (ref 0.0–0.7)
Lymphs Abs: 1.8 10*3/uL (ref 0.7–4.0)
MCH: 28.4 pg (ref 26.0–34.0)
Neutrophils Relative %: 67 % (ref 43–77)
Platelets: 248 10*3/uL (ref 150–400)
RBC: 4.33 MIL/uL (ref 3.87–5.11)
RDW: 14.2 % (ref 11.5–15.5)

## 2011-08-17 ENCOUNTER — Encounter: Payer: Self-pay | Admitting: Internal Medicine

## 2011-08-17 NOTE — Patient Instructions (Signed)
Trial of Vibryd. return in 6 months for fasting lipid panel liver functions and office visit

## 2011-08-17 NOTE — Progress Notes (Signed)
Subjective:    Patient ID: Katherine [name], female    DOB: 01-08-1929, 78 y.o.   MRN: [id number]  HPI very pleasant 78 year old white female recovering alcoholic for many years. 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 History of peptic ulcer disease, hyperlipidemia, vitamin B 12 deficiency and depression. History of positive PPD. Intolerant of aspirin it causes GI symptoms and Advil causes nausea. Takes all [place] when necessary pain. Does have back pain from time to time.  Had appendectomy 1940, hysterectomy with right oophorectomy for endometriosis 1967, vagotomy and pyloroplasty 1975, benign left breast biopsy 1976, Bartholin's cyst surgery 1980 and 19. Gets annual influenza immunization. Colonoscopy done by Dr. Lina Sar 11/19/2005 showing diverticulosis.  She sees Dr. Freida Busman Eduard Clos in [place] for when necessary epidural steroid injections for right lower extremity radicular symptoms.  We have tried a number of antidepressants over the past few years. She has been on Wellbutrin, Cymbalta, Christy and Celexa. Despite this she continues to complain of extreme fatigue and depression.  Family history: Father died at age 54 of uremia. Mother died at age 33 of alcoholism and old age. One sister died at age 76 with alcoholism. Another sister died of cancer with history of alcoholism. 2 adult daughters in good health. Both parents had hypertension.  Patient has been married twice. Current husband is also recovering alcoholic but retired from [employer]. Patient has smoked for over 40 years. Completed 2 years of college.    Review of Systems  Constitutional: Positive for fatigue.  HENT: Negative.   Eyes: Negative.   Respiratory: Negative.   Cardiovascular: Negative.   Gastrointestinal:       History of peptic ulcer disease and irritable bowel symptoms  Genitourinary:       Some stress and urge incontinence  Psychiatric/Behavioral:   Depression       Objective:   Physical Exam  Vitals reviewed. Constitutional: She is oriented to person, place, and time. She appears well-developed and well-nourished.  HENT:  Head: Normocephalic and atraumatic.  Right Ear: External ear normal.  Left Ear: External ear normal.  Mouth/Throat: Oropharynx is clear and moist.  Eyes: Conjunctivae and EOM are normal. Pupils are equal, round, and reactive to light. Right eye exhibits no discharge. Left eye exhibits no discharge.  Neck: Neck supple. No JVD present. No thyromegaly present.  Cardiovascular: Normal rate, regular rhythm and normal heart sounds.   No murmur heard. Pulmonary/Chest: Effort normal and breath sounds normal. She has no wheezes. She has no rales.       Breasts normal female  Abdominal: She exhibits no distension and no mass. There is no tenderness. There is no rebound.  Genitourinary:       Deferred  Musculoskeletal: She exhibits no edema.  Lymphadenopathy:    She has no cervical adenopathy.  Neurological: She is alert and oriented to person, place, and time. She has normal reflexes. She displays normal reflexes. No cranial nerve deficit.  Skin: Skin is warm and dry. No rash noted.  Psychiatric:       Dysphoria          Assessment & Plan:  Depression  History of peptic ulcer disease  Hyperlipidemia  History of B12 deficiency status post vagotomy and pyloroplasty  History of irritable bowel symptoms  History of back pain and lumbar radiculopathy treated by Dr. Nickola Major with epidural steroid injections  Plan: Trial of Vibryd samples provided for depression. Return in 6 months for fasting lipid  panel liver functions and office visit or sooner if necessary for depression

## 2011-09-30 ENCOUNTER — Other Ambulatory Visit: Payer: Self-pay | Admitting: Internal Medicine

## 2011-09-30 DIAGNOSIS — Z1231 Encounter for screening mammogram for malignant neoplasm of breast: Secondary | ICD-10-CM

## 2011-10-17 ENCOUNTER — Other Ambulatory Visit: Payer: Self-pay | Admitting: Internal Medicine

## 2011-10-21 ENCOUNTER — Telehealth: Payer: Self-pay | Admitting: Internal Medicine

## 2011-10-22 NOTE — Telephone Encounter (Signed)
Pt husband came by and we provided samples for her of Viibryd.

## 2011-10-24 ENCOUNTER — Ambulatory Visit (HOSPITAL_COMMUNITY)
Admission: RE | Admit: 2011-10-24 | Discharge: 2011-10-24 | Disposition: A | Discharge status: Home / Self Care | Payer: Medicare Other | Source: Ambulatory Visit | Attending: Internal Medicine | Admitting: Internal Medicine

## 2011-10-24 DIAGNOSIS — Z1231 Encounter for screening mammogram for malignant neoplasm of breast: Secondary | ICD-10-CM | POA: Insufficient documentation

## 2011-11-11 ENCOUNTER — Other Ambulatory Visit: Payer: Self-pay

## 2011-11-11 MED ORDER — ALBUTEROL SULFATE HFA 108 (90 BASE) MCG/ACT IN AERS: 2.0000 | INHALATION_SPRAY | Freq: Four times a day (QID) | RESPIRATORY_TRACT | Status: DC | PRN

## 2012-01-01 ENCOUNTER — Other Ambulatory Visit: Payer: Medicare Other | Admitting: Internal Medicine

## 2012-01-01 DIAGNOSIS — Z79899 Other long term (current) drug therapy: Secondary | ICD-10-CM

## 2012-01-01 DIAGNOSIS — E785 Hyperlipidemia, unspecified: Secondary | ICD-10-CM

## 2012-01-01 LAB — LIPID PANEL
HDL: 60 mg/dL (ref >39)
LDL Cholesterol: 54 mg/dL (ref 0–99)
Total CHOL/HDL Ratio: 2.2 Ratio
VLDL: 16 mg/dL (ref 0–40)

## 2012-01-01 LAB — HEPATIC FUNCTION PANEL
Albumin: 3.6 g/dL (ref 3.5–5.2)
Alkaline Phosphatase: 72 U/L (ref 39–117)
Total Bilirubin: 0.3 mg/dL (ref 0.3–1.2)

## 2012-01-02 ENCOUNTER — Ambulatory Visit (INDEPENDENT_AMBULATORY_CARE_PROVIDER_SITE_OTHER): Payer: Medicare Other | Admitting: Internal Medicine

## 2012-01-02 ENCOUNTER — Encounter: Payer: Self-pay | Admitting: Internal Medicine

## 2012-01-02 VITALS — BP 136/78 | HR 76 | Temp 98.6°F | Wt 121.5 lb

## 2012-01-02 DIAGNOSIS — IMO0001 Reserved for inherently not codable concepts without codable children: Secondary | ICD-10-CM

## 2012-01-02 DIAGNOSIS — M7918 Myalgia, other site: Secondary | ICD-10-CM

## 2012-01-02 DIAGNOSIS — F329 Major depressive disorder, single episode, unspecified: Secondary | ICD-10-CM

## 2012-01-02 DIAGNOSIS — E785 Hyperlipidemia, unspecified: Secondary | ICD-10-CM

## 2012-01-02 DIAGNOSIS — K219 Gastro-esophageal reflux disease without esophagitis: Secondary | ICD-10-CM

## 2012-01-02 MED ORDER — CYCLOBENZAPRINE HCL 10 MG PO TABS: 10.0000 mg | ORAL_TABLET | Freq: Three times a day (TID) | ORAL | Status: DC | PRN

## 2012-01-02 MED ORDER — ATORVASTATIN CALCIUM 10 MG PO TABS: 10.0000 mg | ORAL_TABLET | Freq: Every day | ORAL | Status: DC

## 2012-01-02 NOTE — Patient Instructions (Addendum)
Continue same medications and return in 6 months for physical exam. 

## 2012-01-02 NOTE — Addendum Note (Signed)
Addended by: Judy Pimple on: 01/02/2012 05:09 PM   Modules accepted: Orders

## 2012-01-02 NOTE — Progress Notes (Signed)
  Subjective:    Patient ID: NEKEYA BRISKI, female    DOB: 1928/11/27, 78 y.o.   MRN: [id number]  HPI 78 year old white female in today for six-month recheck on hyperlipidemia, GE reflux, depression, musculoskeletal pain. Occasionally takes tramadol for neck and back pain. Sometimes takes Flexeril. Recent lipid panel and liver functions are apparently within normal limits and are discussed with her today. Feels well for the most part. Doesn't have as much energy as she use to have. Depression well controlled nail with Vybryd    Recently injured left chest wall when reaching for something beside a couch. No bruising. But still hurts after several days.    Review of Systems     Objective:   Physical Exam tender left lateral chest wall. Chest clear to auscultation. Cardiac exam regular rate and rhythm normal S1 and S2. Neck is supple without thyromegaly or carotid bruits. Extremities without edema. Abdomen no hepatosplenomegaly masses or tenderness.        Assessment & Plan:  Musculoskeletal pain left chest wall  History of musculoskeletal pain  GE reflux  Hyperlipidemia  Depression  Plan: Continue same medications. Have refilled Lipitor for one year and Flexeril has been refilled. Vibryd samples recently given to patient. Schedule physical examination in 6 months.

## 2012-02-02 ENCOUNTER — Other Ambulatory Visit: Payer: Self-pay | Admitting: Internal Medicine

## 2012-04-27 ENCOUNTER — Encounter: Payer: Self-pay | Admitting: Internal Medicine

## 2012-04-27 ENCOUNTER — Ambulatory Visit (INDEPENDENT_AMBULATORY_CARE_PROVIDER_SITE_OTHER): Payer: Medicare Other | Admitting: Internal Medicine

## 2012-04-27 VITALS — BP 110/62 | HR 76 | Temp 98.0°F | Wt 124.5 lb

## 2012-04-27 DIAGNOSIS — M545 Low back pain: Secondary | ICD-10-CM

## 2012-04-27 DIAGNOSIS — M546 Pain in thoracic spine: Secondary | ICD-10-CM

## 2012-04-27 NOTE — Patient Instructions (Addendum)
Take prednisone as directed for 6 days for back pain. Take tramadol for pain. Call if not better in one week or sooner if worse.

## 2012-04-27 NOTE — Progress Notes (Signed)
  Subjective:    Patient ID: Katherine [name], female    DOB: 1929/01/25, 78 y.o.   MRN: [id number]  HPI 78 year old white female with history of chronic low back pain. Recently set in the waiting room at the hospital for several hours and later did some housecleaning. This past weekend developed severe low back pain and also pain in her right parathoracic area. She last had an MRI of LS-spine in 2009 showing 3 mm anterior listhesis at L4-L5 with some degenerative disc disease. She also has had some injections per Dr. Nickola Major but not in a year or more. She says the last couple of injections did not work. Most of the time she is able to take tramadol for pain relief. This time tramadol was not helping with the pain. She has pain in both buttocks radiating down both legs. This has awakened her from sleep.  Had similar situation in 2009 which responded to prednisone and tramadol.  She is a recovering alcoholic and does not take narcotic pain medication    Review of Systems     Objective:   Physical Exam straight leg raising is negative at 90 bilaterally. Deep tendon reflexes 2+ and symmetrical in the knees. Muscle strength is 4-5 over 5 in all groups tested in the lower extremities. She has some tenderness over both left and right posterior superior iliac spine. Some mild parathoracic tenderness.        Assessment & Plan:  Low back pain with radiculopathy  Parathoracic back pain  Plan: Sterapred DS 10 mg 6 day dosepak take hysterectomy. Continue tramadol for pain. Call if not better in one week or sooner if worse.

## 2012-07-06 ENCOUNTER — Other Ambulatory Visit: Payer: Medicare Other | Admitting: Internal Medicine

## 2012-07-06 ENCOUNTER — Encounter: Payer: Self-pay | Admitting: Internal Medicine

## 2012-07-06 ENCOUNTER — Other Ambulatory Visit: Payer: Self-pay | Admitting: Internal Medicine

## 2012-07-06 ENCOUNTER — Ambulatory Visit (INDEPENDENT_AMBULATORY_CARE_PROVIDER_SITE_OTHER): Payer: Medicare Other | Admitting: Internal Medicine

## 2012-07-06 VITALS — BP 150/66 | HR 80 | Temp 97.6°F | Wt 125.5 lb

## 2012-07-06 DIAGNOSIS — M810 Age-related osteoporosis without current pathological fracture: Secondary | ICD-10-CM

## 2012-07-06 DIAGNOSIS — J441 Chronic obstructive pulmonary disease with (acute) exacerbation: Secondary | ICD-10-CM | POA: Insufficient documentation

## 2012-07-06 DIAGNOSIS — J449 Chronic obstructive pulmonary disease, unspecified: Secondary | ICD-10-CM | POA: Insufficient documentation

## 2012-07-06 DIAGNOSIS — E785 Hyperlipidemia, unspecified: Secondary | ICD-10-CM

## 2012-07-06 DIAGNOSIS — J209 Acute bronchitis, unspecified: Secondary | ICD-10-CM

## 2012-07-06 DIAGNOSIS — Z79899 Other long term (current) drug therapy: Secondary | ICD-10-CM

## 2012-07-06 LAB — CBC WITH DIFFERENTIAL/PLATELET
HCT: 28.7 % — ABNORMAL LOW (ref 36.0–46.0)
Hemoglobin: 9 g/dL — ABNORMAL LOW (ref 12.0–15.0)
Lymphocytes Relative: 16 % (ref 12–46)
Lymphs Abs: 1.1 10*3/uL (ref 0.7–4.0)
Monocytes Absolute: 0.6 10*3/uL (ref 0.1–1.0)
Monocytes Relative: 10 % (ref 3–12)
Neutro Abs: 4.6 10*3/uL (ref 1.7–7.7)
Neutrophils Relative %: 71 % (ref 43–77)
RBC: 3.72 MIL/uL — ABNORMAL LOW (ref 3.87–5.11)
WBC: 6.5 10*3/uL (ref 4.0–10.5)

## 2012-07-06 LAB — COMPREHENSIVE METABOLIC PANEL
Albumin: 3.7 g/dL (ref 3.5–5.2)
BUN: 15 mg/dL (ref 6–23)
Calcium: 9 mg/dL (ref 8.4–10.5)
Chloride: 107 mEq/L (ref 96–112)
Glucose, Bld: 99 mg/dL (ref 70–99)
Potassium: 4.1 mEq/L (ref 3.5–5.3)
Sodium: 143 mEq/L (ref 135–145)
Total Protein: 6 g/dL (ref 6.0–8.3)

## 2012-07-06 LAB — LIPID PANEL
HDL: 55 mg/dL (ref >39)
LDL Cholesterol: 59 mg/dL (ref 0–99)
Triglycerides: 78 mg/dL (ref <150)

## 2012-07-06 LAB — TSH: TSH: 1.552 u[IU]/mL (ref 0.350–4.500)

## 2012-07-06 NOTE — Progress Notes (Addendum)
  Subjective:    Patient ID: Katherine [name], female    DOB: June 10, 1929, 79 y.o.   MRN: [id number]  HPI Former smoker in  today with URI symptoms.Her husband had an acute URI and was sick before she was.. She was out of town over the weekend January 18 and 19th. Felt short of breath with minimal exertion. Had congested cough with some discolored sputum production. Has had malaise and fatigue. No fever or shaking chills. Did have influenza immunization. Has albuterol inhaler that she's been using some. Pulse oximetry is 98% on room air.    Review of Systems     Objective:   Physical Exam HEENT exam: TMs are clear, pharynx is clear. Neck is supple without significant adenopathy. Chest is clear to auscultation without rales or wheezing. Cough  is congested.        Assessment & Plan:  Acute bronchitis  COPD  Plan: Levaquin 500 milligrams daily for 10 days. Sterapred DS 10 mg 12 day dosepak. Tessalon Perles 100 mg ( #60) 2 by mouth 3 times a day when necessary cough. Use albuterol inhaler 2 sprays by mouth 4 times a day. Call if not better next week or sooner if worse.  CBC diff ; Cmet; TSH drawn  Add: Pt anemic--Hgb 9.0 grams denies melena - hx peptic ulcer disease refer to GI. Add Fe/TIBC

## 2012-07-06 NOTE — Patient Instructions (Addendum)
Take Levaquin 500 milligrams daily for 10 days. Take prednisone as directed for 12 days. Take Tessalon Perles as needed for cough. Call if not better in 7-10 days or sooner if worse

## 2012-07-07 LAB — IRON AND TIBC

## 2012-07-07 LAB — VITAMIN D 25 HYDROXY (VIT D DEFICIENCY, FRACTURES): Vit D, 25-Hydroxy: 63 ng/mL (ref 30–89)

## 2012-07-07 NOTE — Progress Notes (Signed)
Patient informed. 

## 2012-07-08 ENCOUNTER — Encounter: Payer: Medicare Other | Admitting: Internal Medicine

## 2012-07-09 ENCOUNTER — Telehealth: Payer: Self-pay

## 2012-07-09 DIAGNOSIS — D509 Iron deficiency anemia, unspecified: Secondary | ICD-10-CM

## 2012-07-09 NOTE — Telephone Encounter (Signed)
Unable to get patient an appointment with Dr. Juanda Chance until 09/03/2012. Will make Dr. Lenord Fellers aware of this

## 2012-07-12 NOTE — Progress Notes (Signed)
Informed last week. Hemoccult cards given, and FeSo4 325mg  will be started.

## 2012-07-23 ENCOUNTER — Encounter: Payer: Self-pay | Admitting: *Deleted

## 2012-07-27 ENCOUNTER — Encounter: Payer: Self-pay | Admitting: Internal Medicine

## 2012-07-27 ENCOUNTER — Ambulatory Visit (INDEPENDENT_AMBULATORY_CARE_PROVIDER_SITE_OTHER): Payer: Medicare Other | Admitting: Internal Medicine

## 2012-07-27 ENCOUNTER — Telehealth: Payer: Self-pay | Admitting: Internal Medicine

## 2012-07-27 VITALS — BP 120/78 | HR 76 | Temp 98.1°F | Ht 62.0 in | Wt 124.0 lb

## 2012-07-27 DIAGNOSIS — Z9889 Other specified postprocedural states: Secondary | ICD-10-CM

## 2012-07-27 DIAGNOSIS — Z8659 Personal history of other mental and behavioral disorders: Secondary | ICD-10-CM

## 2012-07-27 DIAGNOSIS — Z9289 Personal history of other medical treatment: Secondary | ICD-10-CM

## 2012-07-27 DIAGNOSIS — Z0000 Encounter for general adult medical examination without abnormal findings: Secondary | ICD-10-CM

## 2012-07-27 DIAGNOSIS — R5383 Other fatigue: Secondary | ICD-10-CM

## 2012-07-27 DIAGNOSIS — Z8619 Personal history of other infectious and parasitic diseases: Secondary | ICD-10-CM

## 2012-07-27 DIAGNOSIS — Z87891 Personal history of nicotine dependence: Secondary | ICD-10-CM

## 2012-07-27 DIAGNOSIS — F1021 Alcohol dependence, in remission: Secondary | ICD-10-CM

## 2012-07-27 DIAGNOSIS — IMO0001 Reserved for inherently not codable concepts without codable children: Secondary | ICD-10-CM

## 2012-07-27 DIAGNOSIS — D509 Iron deficiency anemia, unspecified: Secondary | ICD-10-CM

## 2012-07-27 DIAGNOSIS — Z8711 Personal history of peptic ulcer disease: Secondary | ICD-10-CM

## 2012-07-27 DIAGNOSIS — R5381 Other malaise: Secondary | ICD-10-CM

## 2012-07-27 DIAGNOSIS — F329 Major depressive disorder, single episode, unspecified: Secondary | ICD-10-CM

## 2012-07-27 DIAGNOSIS — Z8639 Personal history of other endocrine, nutritional and metabolic disease: Secondary | ICD-10-CM

## 2012-07-27 DIAGNOSIS — M7918 Myalgia, other site: Secondary | ICD-10-CM

## 2012-07-27 DIAGNOSIS — R7611 Nonspecific reaction to tuberculin skin test without active tuberculosis: Secondary | ICD-10-CM

## 2012-07-27 LAB — CBC WITH DIFFERENTIAL/PLATELET
Basophils Relative: 0 % (ref 0–1)
HCT: 31.8 % — ABNORMAL LOW (ref 36.0–46.0)
Hemoglobin: 9.9 g/dL — ABNORMAL LOW (ref 12.0–15.0)
Lymphs Abs: 1.6 10*3/uL (ref 0.7–4.0)
MCH: 25.1 pg — ABNORMAL LOW (ref 26.0–34.0)
MCHC: 31.1 g/dL (ref 30.0–36.0)
Monocytes Absolute: 0.4 10*3/uL (ref 0.1–1.0)
Monocytes Relative: 8 % (ref 3–12)
Neutro Abs: 3.4 10*3/uL (ref 1.7–7.7)
RBC: 3.94 MIL/uL (ref 3.87–5.11)

## 2012-07-27 LAB — POCT URINALYSIS DIPSTICK
Bilirubin, UA: NEGATIVE
Blood, UA: NEGATIVE
Ketones, UA: NEGATIVE
Protein, UA: NEGATIVE
pH, UA: 6

## 2012-07-27 MED ORDER — ATORVASTATIN CALCIUM 10 MG PO TABS: 10.0000 mg | ORAL_TABLET | Freq: Every day | ORAL | Status: DC

## 2012-07-27 MED ORDER — TRAMADOL HCL ER 100 MG PO TB24: 100.0000 mg | ORAL_TABLET | Freq: Every day | ORAL | Status: DC

## 2012-07-27 MED ORDER — CYCLOBENZAPRINE HCL 10 MG PO TABS: 10.0000 mg | ORAL_TABLET | Freq: Three times a day (TID) | ORAL | Status: DC | PRN

## 2012-07-27 MED ORDER — TRAZODONE HCL 50 MG PO TABS: 50.0000 mg | ORAL_TABLET | Freq: Every day | ORAL | Status: DC

## 2012-07-27 NOTE — Telephone Encounter (Signed)
Spoke with Bonita Quin at Dr. Beryle Quant office and scheduled patient on 08/04/12 at 2:30 PM.

## 2012-07-27 NOTE — Patient Instructions (Addendum)
Patient's appointment with Dr. Juanda Chance moved up to 08/04/2012 at 2:15pm. Patient informed. Otherwise return in 6 months

## 2012-07-30 ENCOUNTER — Telehealth: Payer: Self-pay | Admitting: Internal Medicine

## 2012-07-30 NOTE — Telephone Encounter (Signed)
Patient has positive H. pylori antibody. Left message with results on answering machine at patient's home. She will be on 3 drug regimen Biaxin 500 mg twice daily for 2 weeks, amoxicillin 1 g twice daily for 2 weeks, Protonix 40 mg twice daily for 2 weeks. Has appointment to see Dr. Juanda Chance February 19. Also will go ahead and complete anemia workup with B12 and folate level to be drawn next week with reticulocyte count.

## 2012-08-02 ENCOUNTER — Other Ambulatory Visit: Payer: Medicare Other | Admitting: Internal Medicine

## 2012-08-02 ENCOUNTER — Other Ambulatory Visit: Payer: Self-pay | Admitting: Internal Medicine

## 2012-08-03 ENCOUNTER — Other Ambulatory Visit: Payer: Self-pay

## 2012-08-03 LAB — RETICULOCYTES: ABS Retic: 48.6 10*3/uL (ref 19.0–186.0)

## 2012-08-03 LAB — FOLATE: Folate: 10 ng/mL

## 2012-08-03 LAB — VITAMIN B12: Vitamin B-12: 266 pg/mL (ref 211–911)

## 2012-08-03 MED ORDER — TRAMADOL HCL 50 MG PO TABS: 50.0000 mg | ORAL_TABLET | Freq: Three times a day (TID) | ORAL | Status: DC | PRN

## 2012-08-04 ENCOUNTER — Encounter: Payer: Self-pay | Admitting: Internal Medicine

## 2012-08-04 ENCOUNTER — Ambulatory Visit (INDEPENDENT_AMBULATORY_CARE_PROVIDER_SITE_OTHER): Payer: Medicare Other | Admitting: Internal Medicine

## 2012-08-04 VITALS — BP 128/62 | HR 88 | Ht 61.5 in | Wt 125.0 lb

## 2012-08-04 DIAGNOSIS — D509 Iron deficiency anemia, unspecified: Secondary | ICD-10-CM

## 2012-08-04 DIAGNOSIS — R195 Other fecal abnormalities: Secondary | ICD-10-CM

## 2012-08-04 MED ORDER — MOVIPREP 100 G PO SOLR: 1.0000 | Freq: Once | ORAL | Status: DC

## 2012-08-04 NOTE — Patient Instructions (Addendum)
You have been scheduled for an endoscopy and colonoscopy with propofol. Please follow the written instructions given to you at your visit today. Please pick up your prep at the pharmacy within the next 1-3 days. If you use inhalers (even only as needed) or a CPAP machine, please bring them with you on the day of your procedure.  CC: Dr Margaree Mackintosh

## 2012-08-04 NOTE — Progress Notes (Signed)
Katherine [name] 06-22-1928 MRN [id number]   History of Present Illness:  This is an 79 year old white female in very good health who is referred by Dr. Lenord Fellers because of new onset anemia. Her hemoglobin last week was 9.9, hematocrit 31.8 with an MCV of 80. Her B12 level was 266. Her iron saturation was less than 10%. She has had several colonoscopies in the past: in 2006 she had 2 adenomatous polyps, in 2007 there were no polyps. Her last colonoscopy in June 2010 did not show any polyps. She had an upper abdominal ultrasound in August 2011 which showed small hepatic cysts. She denies any dysphagia ,odynophagia, weight loss or night sweats. Her bowel habits have been normal except for recent diarrhea which she attributes to treatment for H. Pylori.  She denies any visible blood per rectum.   Past Medical History  Diagnosis Date  . Hyperlipidemia   . PUD (peptic ulcer disease)   . B12 deficiency   . IBS (irritable bowel syndrome)   . Positive PPD   . Asthma   . Allergy   . Anemia   . Hx of adenomatous colonic polyps 2006  . Hepatic cyst 01/30/10  . Diverticulosis   . Hiatal hernia 1985   Past Surgical History  Procedure Laterality Date  . Appendectomy    . Abdominal hysterectomy    . Vagotomy and pyloroplasty      in [place]  . Breast biopsy    . Bartholin gland cyst excision      x2  . Bladder repair      reports that she quit smoking about 2 years ago. She has never used smokeless tobacco. She reports that she does not drink alcohol or use illicit drugs. family history includes Irritable bowel syndrome in her father; Ovarian cancer in her sister; Stroke in her father; and Ulcers in her father.  There is no history of Colon cancer. Allergies  Allergen Reactions  . Aspirin     REACTION: Hx of ulcers  . Ibuprofen Nausea Only        Review of Systems: Negative for abdominal pain, weight loss, hematochezia,  The remainder of the 10 point ROS is negative except as  outlined in H&P   Physical Exam: General appearance  Well developed, in no distress. Eyes- non icteric. HEENT nontraumatic, normocephalic. Mouth no lesions, tongue papillated, no cheilosis. Neck supple without adenopathy, thyroid not enlarged, no carotid bruits, no JVD. Lungs Clear to auscultation bilaterally. Cor normal S1, normal S2, regular rhythm, no murmur,  quiet precordium. Abdomen: Soft nontender abdomen with normoactive bowel sounds. No distention. Liver edge at costal margin.  Rectal: Manson Passey strongly Hemoccult-positive stool. Extremities no pedal edema. Skin no lesions. Neurological alert and oriented x 3. Psychological normal mood and affect.  Assessment and Plan:  Problem #1 New microcytic anemia. Patient's hemoglobin is 9.9. Patient is strongly Hemoccult-positive. She has no specific GI symptoms to indicate a source of bleeding. Although she had a colonoscopy only 4 years ago, we will have to proceed with a full GI evaluation including upper endoscopy and possible small bowel capsule endoscopy if necessary. I have discussed it with her and she agrees with the plan.   08/04/2012 Lina Sar

## 2012-08-13 ENCOUNTER — Telehealth (INDEPENDENT_AMBULATORY_CARE_PROVIDER_SITE_OTHER): Payer: Medicare Other

## 2012-08-13 DIAGNOSIS — Z1211 Encounter for screening for malignant neoplasm of colon: Secondary | ICD-10-CM

## 2012-08-13 LAB — HEMOCCULT GUIAC POC 1CARD (OFFICE)

## 2012-08-13 NOTE — Telephone Encounter (Signed)
Hemoccults cards all negative / 6 of 6.

## 2012-08-25 ENCOUNTER — Encounter: Payer: Self-pay | Admitting: Internal Medicine

## 2012-08-25 ENCOUNTER — Ambulatory Visit (AMBULATORY_SURGERY_CENTER): Payer: Medicare Other | Admitting: Internal Medicine

## 2012-08-25 ENCOUNTER — Telehealth: Payer: Self-pay | Admitting: *Deleted

## 2012-08-25 ENCOUNTER — Other Ambulatory Visit: Payer: Self-pay | Admitting: *Deleted

## 2012-08-25 VITALS — BP 138/58 | HR 82 | Temp 98.0°F | Resp 27 | Ht 61.0 in | Wt 125.0 lb

## 2012-08-25 DIAGNOSIS — K625 Hemorrhage of anus and rectum: Secondary | ICD-10-CM

## 2012-08-25 DIAGNOSIS — Z860100 Personal history of colon polyps, unspecified: Secondary | ICD-10-CM

## 2012-08-25 DIAGNOSIS — K219 Gastro-esophageal reflux disease without esophagitis: Secondary | ICD-10-CM

## 2012-08-25 DIAGNOSIS — D509 Iron deficiency anemia, unspecified: Secondary | ICD-10-CM

## 2012-08-25 DIAGNOSIS — K222 Esophageal obstruction: Secondary | ICD-10-CM

## 2012-08-25 DIAGNOSIS — R195 Other fecal abnormalities: Secondary | ICD-10-CM

## 2012-08-25 DIAGNOSIS — D500 Iron deficiency anemia secondary to blood loss (chronic): Secondary | ICD-10-CM

## 2012-08-25 MED ORDER — SODIUM CHLORIDE 0.9 % IV SOLN: 500.0000 mL | INTRAVENOUS | Status: DC

## 2012-08-25 MED ORDER — FAMOTIDINE 40 MG PO TABS: 40.0000 mg | ORAL_TABLET | Freq: Every evening | ORAL | Status: DC | PRN

## 2012-08-25 NOTE — Telephone Encounter (Signed)
Per Dr. Juanda Chance, scheduled barium enema without air at [facility] radiology([place]) on 09/01/12 at 9:15/9:30 AM. Patient will need to pick up prep by Monday. Appointment date and instructions given to Erskine Squibb, RN in [facility] recovery to give to patient.

## 2012-08-25 NOTE — Progress Notes (Signed)
Patient did not experience any of the following events: a burn prior to discharge; a fall within the facility; wrong site/side/patient/procedure/implant event; or a hospital transfer or hospital admission upon discharge from the facility. (G8907) Patient did not have preoperative order for IV antibiotic SSI prophylaxis. (G8918)  

## 2012-08-25 NOTE — Op Note (Signed)
[facility] [address] [place], [address]   ENDOSCOPY PROCEDURE REPORT  PATIENT: Katherine, [name]  MR#: [id number] BIRTHDATE: Nov 01, 1928 , 84  yrs. old GENDER: Female ENDOSCOPIST: Hart Carwin, MD REFERRED BY:  Sharlet Salina, M.D. PROCEDURE DATE:  08/25/2012 PROCEDURE:  EGD, diagnostic and Savary dilation of esophagus ASA CLASS:     Class II INDICATIONS:  Hematochezia.   Occult blood positive.   Iron deficiency anemia. MEDICATIONS: MAC sedation, administered by CRNA and Propofol (Diprivan) 120 mg IV TOPICAL ANESTHETIC: none  DESCRIPTION OF PROCEDURE: After the risks benefits and alternatives of the procedure were thoroughly explained, informed consent was obtained.  The [facility] GIF-H180 [id number] endoscope was introduced through the mouth and advanced to the second portion of the duodenum. Without limitations.  The instrument was slowly withdrawn as the mucosa was fully examined.        ESOPHAGUS: A stricture was found at the gastroesophageal junction. 14 mm benign appearing stricture, stricture was dilated with Savary dilators, 14, 15, and 16 mm dilator was passed without difficulty. There was small amount of blood on the last dilator.  The stenosis was traversable with the endoscope.  STOMACH: A 4 cm hiatal hernia was noted.extending from 34-38 cm from the incisors there was no evidence of Cameron erosions  Retroflexed views revealed no abnormalities.     The scope was then withdrawn from the patient and the procedure completed.  COMPLICATIONS: There were no complications. ENDOSCOPIC IMPRESSION: 1.   Stricture was found at the gastroesophageal junction 2.   4 cm hiatal hernia - non reducible 3.  dilation of esophageal stricture with Savary dilators from 14-16 mm no evidence of GI bleeding  RECOMMENDATIONS: 1.  My office will schedule a colonoscopy 2.  Anti-reflux regimen to be follow  REPEAT EXAM: for No recall due to age.Marland Kitchen  eSigned:   Hart Carwin, MD 08/25/2012 2:37 PM   CC:  PATIENT NAME:  Katherine, [name] MR#: [id number]

## 2012-08-25 NOTE — Progress Notes (Signed)
Called to room to assist during endoscopic procedure.  Patient ID and intended procedure confirmed with present staff. Received instructions for my participation in the procedure from the performing physician.  

## 2012-08-25 NOTE — Patient Instructions (Addendum)
YOU HAD AN ENDOSCOPIC PROCEDURE TODAY AT THE [facility]: Refer to the procedure report that was given to you for any specific questions about what was found during the examination.  If the procedure report does not answer your questions, please call your gastroenterologist to clarify.  If you requested that your care partner not be given the details of your procedure findings, then the procedure report has been included in a sealed envelope for you to review at your convenience later.  YOU SHOULD EXPECT: Some feelings of bloating in the abdomen. Passage of more gas than usual.  Walking can help get rid of the air that was put into your GI tract during the procedure and reduce the bloating. If you had a lower endoscopy (such as a colonoscopy or flexible sigmoidoscopy) you may notice spotting of blood in your stool or on the toilet paper. If you underwent a bowel prep for your procedure, then you may not have a normal bowel movement for a few days.  DIET: Your first meal following the procedure should be a light meal and then it is ok to progress to your normal diet.  A half-sandwich or bowl of soup is an example of a good first meal.  Heavy or fried foods are harder to digest and may make you feel nauseous or bloated.  Likewise meals heavy in dairy and vegetables can cause extra gas to form and this can also increase the bloating.  Drink plenty of fluids but you should avoid alcoholic beverages for 24 hours.  ACTIVITY: Your care partner should take you home directly after the procedure.  You should plan to take it easy, moving slowly for the rest of the day.  You can resume normal activity the day after the procedure however you should NOT DRIVE or use heavy machinery for 24 hours (because of the sedation medicines used during the test).    SYMPTOMS TO REPORT IMMEDIATELY: A gastroenterologist can be reached at any hour.  During normal business hours, 8:30 AM to 5:00 PM Monday through Friday,  call [phone].  After hours and on weekends, please call the GI answering service at [phone] who will take a message and have the physician on call contact you.   Following lower endoscopy (colonoscopy or flexible sigmoidoscopy):  Excessive amounts of blood in the stool  Significant tenderness or worsening of abdominal pains  Swelling of the abdomen that is new, acute  Fever of 100F or higher  Following upper endoscopy (EGD)  Vomiting of blood or coffee ground material  New chest pain or pain under the shoulder blades  Painful or persistently difficult swallowing  New shortness of breath  Fever of 100F or higher  Black, tarry-looking stools  FOLLOW UP: If any biopsies were taken you will be contacted by phone or by letter within the next 1-3 weeks.  Call your gastroenterologist if you have not heard about the biopsies in 3 weeks.  Our staff will call the home number listed on your records the next business day following your procedure to check on you and address any questions or concerns that you may have at that time regarding the information given to you following your procedure. This is a courtesy call and so if there is no answer at the home number and we have not heard from you through the emergency physician on call, we will assume that you have returned to your regular daily activities without incident.  SIGNATURES/CONFIDENTIALITY: You and/or your care   partner have signed paperwork which will be entered into your electronic medical record.  These signatures attest to the fact that that the information above on your After Visit Summary has been reviewed and is understood.  Full responsibility of the confidentiality of this discharge information lies with you and/or your care-partner.    After dilation diet given with instructions, stricture information given.  Incomplete colonoscopy.  Barium enema March 19th at 09:15AM at [facility].  Please pick up prep  for this Procedure before Monday.    No liquid until 3:30PM.  Liquids for one hour.  Then follow with soft diet remainder of day.  Pepcid added to regimen per Dr. Juanda Chance, pt. States she is not using protonix.

## 2012-08-25 NOTE — Op Note (Signed)
[facility] [address] [place], [address]   COLONOSCOPY PROCEDURE REPORT  PATIENT: Katherine [name], Katherine [name]  MR#: [id number] BIRTHDATE: October 10, 1928 , 84  yrs. old GENDER: Female ENDOSCOPIST: Hart Carwin, MD REFERRED BY:  Sharlet Salina, M.D. PROCEDURE DATE:  08/25/2012 PROCEDURE:   Colonoscopy, incomplete ASA CLASS:   Class II INDICATIONS:hematochezia, heme-positive stool, Iron Deficiency Anemia, and Hgb9.9, colonoscopy 2006- 2 adenomatous polypd, colon 1914,7829- no polyps. MEDICATIONS: MAC sedation, administered by CRNA and Propofol (Diprivan) 180 mg IV  DESCRIPTION OF PROCEDURE:   After the risks and benefits and of the procedure were explained, informed consent was obtained.  A digital rectal exam revealed no abnormalities of the rectum.    The LB CF-Q180AL [id number] and LB PCF-H180AL [id number]  endoscope was introduced through the anus and advanced to the sigmoid colon . The quality of the prep was excellent, using MoviPrep .  The instrument was then slowly withdrawn as the colon was fully examined.    rectal sphincter tone was decreased. Rectosigmoid colon was torturous and there was a sharp turn at 20 cm at the pelvic rim which did not allow the pediatric endoscope to traverse further up. Lumen was narrow and there were multiple diverticula. I was unable to negotiate through this narrow and tortuous  segment of the sigmoid colon and the procedure was terminated. There was a small superficial tear in the sigmoid mucosa, pressure of the scope Retroflexion was not performed.     The scope was then withdrawn from the patient and the procedure completed.  COMPLICATIONS: There were no complications. ENDOSCOPIC IMPRESSION:  incomplete colonoscopy due to torturous sigmoid colon at the level of 20 cm. Unable to negotiate without risk of perforation  RECOMMENDATIONS:  barium enema, in a few days. Left this small tear in the sigmoid mucosa to heal for  diffusing barium Continue iron supplements  REPEAT EXAM: for No recall due to age..  cc:[[place] [employer]]  _______________________________ eSignedHart Carwin, MD 08/25/2012 2:47 PM

## 2012-08-25 NOTE — Progress Notes (Signed)
Stable to RR 

## 2012-08-26 ENCOUNTER — Telehealth: Payer: Self-pay | Admitting: *Deleted

## 2012-08-26 NOTE — Telephone Encounter (Signed)
  Follow up Call-  Call back number 08/25/2012  Permission to leave phone message Yes     Patient questions:  Do you have a fever, pain , or abdominal swelling? no Pain Score  0 *  Have you tolerated food without any problems? Yes   Have you been able to return to your normal activities? yes  Do you have any questions about your discharge instructions: Diet   no Medications  no Follow up visit  no  Do you have questions or concerns about your Care? no  Actions: * If pain score is 4 or above: No action needed, pain <4.

## 2012-09-01 ENCOUNTER — Ambulatory Visit (HOSPITAL_COMMUNITY)
Admission: RE | Admit: 2012-09-01 | Discharge: 2012-09-01 | Disposition: A | Discharge status: Home / Self Care | Payer: Medicare Other | Source: Ambulatory Visit | Attending: Internal Medicine | Admitting: Internal Medicine

## 2012-09-01 DIAGNOSIS — K625 Hemorrhage of anus and rectum: Secondary | ICD-10-CM | POA: Insufficient documentation

## 2012-09-01 DIAGNOSIS — K5730 Diverticulosis of large intestine without perforation or abscess without bleeding: Secondary | ICD-10-CM | POA: Insufficient documentation

## 2012-09-01 DIAGNOSIS — D500 Iron deficiency anemia secondary to blood loss (chronic): Secondary | ICD-10-CM

## 2012-09-03 ENCOUNTER — Ambulatory Visit: Payer: Medicare Other | Admitting: Internal Medicine

## 2012-10-04 ENCOUNTER — Telehealth: Payer: Self-pay | Admitting: Internal Medicine

## 2012-10-04 NOTE — Telephone Encounter (Signed)
From the company:  [employer] Richardo Priest).  He confirmed that [name] has already purchased the unit and they will be installing it pretty quickly for them.  He confirmed that they indeed just need a Rx that states simply "Stair Lift" and this will entitle them to the exemption.  It is to be faxed to [phone].  Thanks.

## 2012-10-04 NOTE — Telephone Encounter (Signed)
Who needs this and why- need reason of medical necesssity

## 2012-10-05 NOTE — Telephone Encounter (Signed)
Rx faxed to [employer]

## 2012-10-07 ENCOUNTER — Other Ambulatory Visit: Payer: Self-pay | Admitting: Internal Medicine

## 2012-10-07 DIAGNOSIS — Z1231 Encounter for screening mammogram for malignant neoplasm of breast: Secondary | ICD-10-CM

## 2012-10-25 ENCOUNTER — Ambulatory Visit (HOSPITAL_COMMUNITY)
Admission: RE | Admit: 2012-10-25 | Discharge: 2012-10-25 | Disposition: A | Discharge status: Home / Self Care | Payer: Medicare Other | Source: Ambulatory Visit | Attending: Internal Medicine | Admitting: Internal Medicine

## 2012-10-25 DIAGNOSIS — Z1231 Encounter for screening mammogram for malignant neoplasm of breast: Secondary | ICD-10-CM

## 2012-11-15 ENCOUNTER — Encounter: Payer: Self-pay | Admitting: Internal Medicine

## 2012-11-15 ENCOUNTER — Ambulatory Visit (INDEPENDENT_AMBULATORY_CARE_PROVIDER_SITE_OTHER): Payer: Medicare Other | Admitting: Internal Medicine

## 2012-11-15 VITALS — BP 150/84 | HR 84 | Temp 98.1°F | Wt 113.0 lb

## 2012-11-15 DIAGNOSIS — N390 Urinary tract infection, site not specified: Secondary | ICD-10-CM

## 2012-11-15 DIAGNOSIS — R109 Unspecified abdominal pain: Secondary | ICD-10-CM

## 2012-11-15 DIAGNOSIS — N10 Acute tubulo-interstitial nephritis: Secondary | ICD-10-CM

## 2012-11-15 DIAGNOSIS — D509 Iron deficiency anemia, unspecified: Secondary | ICD-10-CM | POA: Insufficient documentation

## 2012-11-15 DIAGNOSIS — R110 Nausea: Secondary | ICD-10-CM

## 2012-11-15 LAB — CBC WITH DIFFERENTIAL/PLATELET
Basophils Absolute: 0 10*3/uL (ref 0.0–0.1)
Eosinophils Relative: 0 % (ref 0–5)
HCT: 40.6 % (ref 36.0–46.0)
Lymphocytes Relative: 13 % (ref 12–46)
Lymphs Abs: 1.9 10*3/uL (ref 0.7–4.0)
MCV: 82 fL (ref 78.0–100.0)
Monocytes Absolute: 1.1 10*3/uL — ABNORMAL HIGH (ref 0.1–1.0)
Neutro Abs: 12.3 10*3/uL — ABNORMAL HIGH (ref 1.7–7.7)
Platelets: 303 10*3/uL (ref 150–400)
RBC: 4.95 MIL/uL (ref 3.87–5.11)
RDW: 15.5 % (ref 11.5–15.5)
WBC: 15.4 10*3/uL — ABNORMAL HIGH (ref 4.0–10.5)

## 2012-11-15 LAB — POCT URINALYSIS DIPSTICK
Bilirubin, UA: NEGATIVE
Ketones, UA: NEGATIVE
Spec Grav, UA: >=1.03
pH, UA: 6

## 2012-11-15 MED ORDER — PROMETHAZINE HCL 25 MG/ML IJ SOLN
25.0000 mg | Freq: Once | INTRAMUSCULAR | Status: AC
  Administered 2012-11-15: 25 mg via INTRAMUSCULAR

## 2012-11-15 MED ORDER — CEFTRIAXONE SODIUM 1 G IJ SOLR
1.0000 g | Freq: Once | INTRAMUSCULAR | Status: AC
  Administered 2012-11-15: 1 g via INTRAMUSCULAR

## 2012-11-15 NOTE — Patient Instructions (Addendum)
Take Cipro 500 mg twice daily for 10 days. Take Phenergan as needed for nausea. Return tomorrow for reevaluation.

## 2012-11-15 NOTE — Progress Notes (Signed)
  Subjective:    Patient ID: Katherine [name], female    DOB: 1929-05-10, 79 y.o.   MRN: [id number]  HPI Onset last week of UTI symptoms. Initially didn't think too much about it but symptoms got worse this past weekend. Became very nauseated on Saturday, May 31. Has had little by mouth intake because of nausea. Significant right lower quadrant abdominal pain and right flank pain. No prior history of kidney stone. History of iron deficiency anemia recently evaluated by gastroenterologist, Dr. Juanda Chance. Patient also had a barium enema.  No recent UTI on file.    Review of Systems     Objective:   Physical Exam Right CVA tenderness. Tender in right lower quadrant without rebound tenderness. Urinalysis is abnormal and consistent with infection. Culture is pending.        Assessment & Plan:  Pyelonephritis  Plan: CBC with differential. 1 g IM Rocephin. Phenergan 25 mg IM given in office.   Prescription for Cipro 500 mg by mouth twice daily for 10 days. Phenergan 25 mg tablets (#30) 1 by mouth every 4 hours when necessary nausea. Return tomorrow for reevaluation.

## 2012-11-16 ENCOUNTER — Encounter: Payer: Self-pay | Admitting: Internal Medicine

## 2012-11-16 ENCOUNTER — Ambulatory Visit (INDEPENDENT_AMBULATORY_CARE_PROVIDER_SITE_OTHER): Payer: Medicare Other | Admitting: Internal Medicine

## 2012-11-16 VITALS — BP 144/82 | HR 92 | Temp 97.9°F | Wt 112.0 lb

## 2012-11-16 DIAGNOSIS — N390 Urinary tract infection, site not specified: Secondary | ICD-10-CM

## 2012-11-16 MED ORDER — CEFTRIAXONE SODIUM 1 G IJ SOLR
1.0000 g | Freq: Once | INTRAMUSCULAR | Status: AC
  Administered 2012-11-16: 1 g via INTRAMUSCULAR

## 2012-11-16 NOTE — Patient Instructions (Signed)
Increase Phenergan 50 mg every 6 hours as needed for nausea. Try to eat crackers and toast or sleep. Return tomorrow for another injection of Rocephin. 1 g IM Rocephin given today.

## 2012-11-16 NOTE — Progress Notes (Signed)
  Subjective:    Patient ID: CHEYEANNE ROADCAP, female    DOB: Oct 27, 1928, 79 y.o.   MRN: [id number]  HPI Seen yesterday for acute pyelonephritis. White blood cell count 15,400 yesterday. She was given 1 g IM Rocephin and started on Cipro 500 mg twice daily. Was given Phenergan tablets for nausea. Still quite nauseated but drinking fluids. Not able to eat saltine crackers because of nausea. Less right CVA tenderness today.    Review of Systems     Objective:   Physical Exam decreased CVA and right lower quadrant tenderness        Assessment & Plan:  Pyelonephritis  Plan: Urine culture pending. Continue Cipro 500 mg twice daily to complete a ten-day course. Return tomorrow for another 1 g IM Rocephin injection. 1 g IM Rocephin given today. Increase Phenergan to 50 mg every 6 hours when necessary nausea. Anticipatory guidance given to patient. She will likely take 7 days to recover from this at a minimum. Still looks weak and washed out.  Samples of Pristiq antidepressant given to patient.

## 2012-11-17 ENCOUNTER — Ambulatory Visit (INDEPENDENT_AMBULATORY_CARE_PROVIDER_SITE_OTHER): Payer: Medicare Other | Admitting: Internal Medicine

## 2012-11-17 DIAGNOSIS — N390 Urinary tract infection, site not specified: Secondary | ICD-10-CM

## 2012-11-17 LAB — URINE CULTURE

## 2012-11-17 MED ORDER — CEFTRIAXONE SODIUM 1 G IJ SOLR
1.0000 g | Freq: Once | INTRAMUSCULAR | Status: AC
  Administered 2012-11-17: 1 g via INTRAMUSCULAR

## 2013-01-02 DIAGNOSIS — Z8639 Personal history of other endocrine, nutritional and metabolic disease: Secondary | ICD-10-CM | POA: Insufficient documentation

## 2013-01-02 DIAGNOSIS — Z8619 Personal history of other infectious and parasitic diseases: Secondary | ICD-10-CM | POA: Insufficient documentation

## 2013-01-02 DIAGNOSIS — Z9289 Personal history of other medical treatment: Secondary | ICD-10-CM | POA: Insufficient documentation

## 2013-01-02 DIAGNOSIS — Z8659 Personal history of other mental and behavioral disorders: Secondary | ICD-10-CM | POA: Insufficient documentation

## 2013-01-02 DIAGNOSIS — Z9889 Other specified postprocedural states: Secondary | ICD-10-CM | POA: Insufficient documentation

## 2013-01-02 DIAGNOSIS — F1021 Alcohol dependence, in remission: Secondary | ICD-10-CM | POA: Insufficient documentation

## 2013-01-02 NOTE — Progress Notes (Signed)
Subjective:    Patient ID: Katherine [name], female    DOB: [birth year], 79 y.o.   MRN: [id number]  HPI 79 year old White female with history of peptic ulcer disease, hyperlipidemia, depression, B12 deficiency today for health maintenance and evaluation of medical problems. She is intolerant of aspirin it causes GI symptoms and Advil causes nausea.  Past medical history: Hospitalized with severe duodenitis in 1995, left radial fracture secondary to a fall in 2006, history of vertigo November 2007, UTI July 2010.  Tetanus immunization 2004, gets annual influenza immunization, Pneumovax immunization 1998.  Appendectomy 1940 hysterectomy with right oophorectomy for endometriosis 1967, vagotomy and pyloroplasty 1975, benign left breast biopsy 1976, Bartholin's cyst excised twice in 1980 and 1982.  Family history: Father died at 37 3D to uremia and mother died at 70 due to age and alcoholism. One sister died at 77 with complications of alcoholism. Another sister died at 80 because of alcoholism and cancer.  Social history: Patient has 2 adult daughters in good health. Patient completed high school and 2 years of college. 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 She is recovering alcoholic herself. Smoked for over 40 years. She and her husband are very active in Alcoholics Anonymous. This is her second marriage. No children from second marriage.  History of epidural steroid injections for L5-S1 back pain. These have been done by Dr. Nickola Major  History of needle guided excision of right breast calcification 2008 by Dr. Jamey Ripa.  History of left long finger injury in 2005 when she caught her hand in the dryer door which resulted in a deep laceration. X-ray showed 2 small bone fragments. Was felt to have Raynaud's phenomenon treated conservatively. Was evaluated by Dr. Mina Marble.    Review of Systems  Constitutional: Positive for fatigue.  HENT: Negative.   Eyes: Negative.    Respiratory:       Some shortness of breath  Cardiovascular: Negative.   Gastrointestinal:       History of GE reflux and peptic ulcer disease  Endocrine: Negative.   Genitourinary: Negative.   Allergic/Immunologic: Negative.   Hematological: Negative.   Psychiatric/Behavioral: Negative.        History of depression       Objective:   Physical Exam  Vitals reviewed. Constitutional: She is oriented to person, place, and time. She appears well-developed and well-nourished. No distress.  HENT:  Head: Normocephalic and atraumatic.  Right Ear: External ear normal.  Left Ear: External ear normal.  Mouth/Throat: Oropharynx is clear and moist. No oropharyngeal exudate.  Eyes: Conjunctivae and EOM are normal. Pupils are equal, round, and reactive to light. Right eye exhibits no discharge. Left eye exhibits no discharge.  Neck: Neck supple. No JVD present.  Cardiovascular: Normal rate, regular rhythm, normal heart sounds and intact distal pulses.   No murmur heard. Pulmonary/Chest: Effort normal and breath sounds normal. No respiratory distress. She has no wheezes. She has no rales. She exhibits no tenderness.  Breasts normal female  Abdominal: Soft. Bowel sounds are normal. She exhibits no distension and no mass. There is no tenderness. There is no rebound and no guarding.  Genitourinary:  Deferred status post hysterectomy  Musculoskeletal: Normal range of motion. She exhibits no edema.  Lymphadenopathy:    She has no cervical adenopathy.  Neurological: She is alert and oriented to person, place, and time. She has normal reflexes. No cranial nerve deficit. Coordination normal.  Skin: Skin is warm and dry. No rash noted. She is  not diaphoretic.  Psychiatric: Her behavior is normal. Judgment and thought content normal.          Assessment & Plan:    Microcytic anemia-history of peptic ulcer disease. Rule out recurrence. Patient will need to see Dr. Lina Sar gastroenterologist  for GI evaluation  She will be given 3 Hemoccult cards. H. pylori antibody will be added to lab work. Addendum: H. pylori is positive. Will be treated for H. pylori. This well could explain her symptoms since she's been fatigued and somewhat short of breath.  History of smoking  Recovering alcoholic  Hyperlipidemia-treated with statin medication  History of B12 deficiency  History of depression treated with SSRI  Plan: GI evaluation as soon as possible. Otherwise return in 6 months for office visit, CBC, lipid panel liver functions in followup of hyperlipidemia and anemia   Subjective:   Patient presents for Medicare Annual/Subsequent preventive examination.   Review Past Medical/Family/Social: See Epic   Risk Factors  Current exercise habits: Housework Dietary issues discussed: Low-fat low-carb  Cardiac risk factors: History of smoking, hyperlipidemia  Depression Screen  (Note: if answer to either of the following is "Yes", a more complete depression screening is indicated)   Over the past two weeks, have you felt down, depressed or hopeless? No just fatigued Over the past two weeks, have you felt little interest or pleasure in doing things? No Have you lost interest or pleasure in daily life? No Do you often feel hopeless? No Do you cry easily over simple problems? No   Activities of Daily Living  In your present state of health, do you have any difficulty performing the following activities?:   Driving? No  Managing money? No  Feeding yourself? No  Getting from bed to chair? No  Climbing a flight of stairs? No  Preparing food and eating?: No  Bathing or showering? No  Getting dressed: No  Getting to the toilet? No  Using the toilet:No  Moving around from place to place: No  In the past year have you fallen or had a near fall?:No  Are you sexually active? No  Do you have more than one partner? No   Hearing Difficulties: No  Do you often ask people to speak  up or repeat themselves? No  Do you experience ringing or noises in your ears? No  Do you have difficulty understanding soft or whispered voices? No  Do you feel that you have a problem with memory? No Do you often misplace items? No    Home Safety:  Do you have a smoke alarm at your residence? Yes Do you have grab bars in the bathroom? No Do you have throw rugs in your house?   Cognitive Testing  Alert? Yes Normal Appearance?Yes  Oriented to person? Yes Place? Yes  Time? Yes  Recall of three objects? Yes  Can perform simple calculations? Yes  Displays appropriate judgment?Yes  Can read the correct time from a watch face?Yes   List the Names of Other Physician/Practitioners you currently use:  See referral list for the physicians patient is currently seeing.  Dr. Lina Sar GI   Review of Systems: See Epic   Objective:     General appearance: Appears younger than stated age Head: Normocephalic, without obvious abnormality, atraumatic  Eyes: conj clear, EOMi PEERLA  Ears: normal TM's and external ear canals both ears  Nose: Nares normal. Septum midline. Mucosa normal. No drainage or sinus tenderness.  Throat: lips, mucosa, and tongue normal; teeth  and gums normal  Neck: no adenopathy, no carotid bruit, no JVD, supple, symmetrical, trachea midline and thyroid not enlarged, symmetric, no tenderness/mass/nodules  No CVA tenderness.  Lungs: clear to auscultation bilaterally  Breasts: normal appearance, no masses or tenderness. Heart: regular rate and rhythm, S1, S2 normal, no murmur, click, rub or gallop  Abdomen: soft, non-tender; bowel sounds normal; no masses, no organomegaly  Musculoskeletal: ROM normal in all joints, no crepitus, no deformity, Normal muscle strengthen. Back  is symmetric, no curvature. Skin: Skin color, texture, turgor normal. No rashes or lesions  Lymph nodes: Cervical, supraclavicular, and axillary nodes normal.  Neurologic: CN 2 -12 Normal, Normal  symmetric reflexes. Normal coordination and gait  Psych: Alert & Oriented x 3, Mood appear stable.    Assessment:    Annual wellness medicare exam   Plan:    During the course of the visit the patient was educated and counseled about appropriate screening and preventive services including:   Annual mammogram Last colonoscopy was 2007     Patient Instructions (the written plan) was given to the patient.  Medicare Attestation  I have personally reviewed:  The patient's medical and social history  Their use of alcohol, tobacco or illicit drugs  Their current medications and supplements  The patient's functional ability including ADLs,fall risks, home safety risks, cognitive, and hearing and visual impairment  Diet and physical activities  Evidence for depression or mood disorders  The patient's weight, height, BMI, and visual acuity have been recorded in the chart. I have made referrals, counseling, and provided education to the patient based on review of the above and I have provided the patient with a written personalized care plan for preventive services.

## 2013-03-04 ENCOUNTER — Other Ambulatory Visit: Payer: Self-pay | Admitting: Internal Medicine

## 2013-03-05 ENCOUNTER — Other Ambulatory Visit: Payer: Self-pay | Admitting: Internal Medicine

## 2013-07-05 ENCOUNTER — Other Ambulatory Visit: Payer: Self-pay | Admitting: Internal Medicine

## 2013-08-01 ENCOUNTER — Other Ambulatory Visit: Payer: Medicare PPO | Admitting: Internal Medicine

## 2013-08-01 DIAGNOSIS — E785 Hyperlipidemia, unspecified: Secondary | ICD-10-CM

## 2013-08-01 DIAGNOSIS — D649 Anemia, unspecified: Secondary | ICD-10-CM

## 2013-08-01 DIAGNOSIS — E559 Vitamin D deficiency, unspecified: Secondary | ICD-10-CM

## 2013-08-01 DIAGNOSIS — Z1329 Encounter for screening for other suspected endocrine disorder: Secondary | ICD-10-CM

## 2013-08-01 DIAGNOSIS — Z79899 Other long term (current) drug therapy: Secondary | ICD-10-CM

## 2013-08-01 LAB — CBC WITH DIFFERENTIAL/PLATELET
BASOS ABS: 0 10*3/uL (ref 0.0–0.1)
BASOS PCT: 1 % (ref 0–1)
EOS ABS: 0.2 10*3/uL (ref 0.0–0.7)
EOS PCT: 3 % (ref 0–5)
HCT: 36.1 % (ref 36.0–46.0)
Hemoglobin: 11.8 g/dL — ABNORMAL LOW (ref 12.0–15.0)
LYMPHS PCT: 30 % (ref 12–46)
Lymphs Abs: 1.7 10*3/uL (ref 0.7–4.0)
MCH: 29.1 pg (ref 26.0–34.0)
MCHC: 32.7 g/dL (ref 30.0–36.0)
MCV: 89.1 fL (ref 78.0–100.0)
Monocytes Absolute: 0.4 10*3/uL (ref 0.1–1.0)
Monocytes Relative: 8 % (ref 3–12)
Neutro Abs: 3.3 10*3/uL (ref 1.7–7.7)
Neutrophils Relative %: 58 % (ref 43–77)
PLATELETS: 236 10*3/uL (ref 150–400)
RBC: 4.05 MIL/uL (ref 3.87–5.11)
RDW: 13.8 % (ref 11.5–15.5)
WBC: 5.6 10*3/uL (ref 4.0–10.5)

## 2013-08-02 ENCOUNTER — Encounter: Payer: Medicare Other | Admitting: Internal Medicine

## 2013-08-02 LAB — LIPID PANEL
CHOLESTEROL: 136 mg/dL (ref 0–200)
HDL: 63 mg/dL (ref >39)
LDL Cholesterol: 51 mg/dL (ref 0–99)
Total CHOL/HDL Ratio: 2.2 Ratio
Triglycerides: 108 mg/dL (ref <150)
VLDL: 22 mg/dL (ref 0–40)

## 2013-08-02 LAB — COMPREHENSIVE METABOLIC PANEL
ALBUMIN: 4 g/dL (ref 3.5–5.2)
ALT: 11 U/L (ref 0–35)
AST: 21 U/L (ref 0–37)
Alkaline Phosphatase: 61 U/L (ref 39–117)
BUN: 13 mg/dL (ref 6–23)
CO2: 27 mEq/L (ref 19–32)
Calcium: 9 mg/dL (ref 8.4–10.5)
Chloride: 106 mEq/L (ref 96–112)
Creat: 1.03 mg/dL (ref 0.50–1.10)
Glucose, Bld: 87 mg/dL (ref 70–99)
POTASSIUM: 3.4 meq/L — AB (ref 3.5–5.3)
Sodium: 140 mEq/L (ref 135–145)
Total Bilirubin: 0.5 mg/dL (ref 0.2–1.2)
Total Protein: 6.3 g/dL (ref 6.0–8.3)

## 2013-08-02 LAB — TSH: TSH: 2.11 u[IU]/mL (ref 0.350–4.500)

## 2013-08-02 LAB — VITAMIN D 25 HYDROXY (VIT D DEFICIENCY, FRACTURES): Vit D, 25-Hydroxy: 56 ng/mL (ref 30–89)

## 2013-08-15 ENCOUNTER — Encounter: Payer: Self-pay | Admitting: Internal Medicine

## 2013-08-15 ENCOUNTER — Ambulatory Visit (INDEPENDENT_AMBULATORY_CARE_PROVIDER_SITE_OTHER): Payer: Medicare PPO | Admitting: Internal Medicine

## 2013-08-15 VITALS — BP 146/76 | HR 92 | Temp 97.9°F | Ht 60.5 in | Wt 119.0 lb

## 2013-08-15 DIAGNOSIS — Z8709 Personal history of other diseases of the respiratory system: Secondary | ICD-10-CM

## 2013-08-15 DIAGNOSIS — N390 Urinary tract infection, site not specified: Secondary | ICD-10-CM

## 2013-08-15 DIAGNOSIS — R7611 Nonspecific reaction to tuberculin skin test without active tuberculosis: Secondary | ICD-10-CM

## 2013-08-15 DIAGNOSIS — Z8659 Personal history of other mental and behavioral disorders: Secondary | ICD-10-CM

## 2013-08-15 DIAGNOSIS — E785 Hyperlipidemia, unspecified: Secondary | ICD-10-CM

## 2013-08-15 DIAGNOSIS — Z8619 Personal history of other infectious and parasitic diseases: Secondary | ICD-10-CM

## 2013-08-15 DIAGNOSIS — Z90721 Acquired absence of ovaries, unilateral: Secondary | ICD-10-CM

## 2013-08-15 DIAGNOSIS — Z862 Personal history of diseases of the blood and blood-forming organs and certain disorders involving the immune mechanism: Secondary | ICD-10-CM

## 2013-08-15 DIAGNOSIS — IMO0001 Reserved for inherently not codable concepts without codable children: Secondary | ICD-10-CM

## 2013-08-15 DIAGNOSIS — Z87891 Personal history of nicotine dependence: Secondary | ICD-10-CM

## 2013-08-15 DIAGNOSIS — Z9889 Other specified postprocedural states: Secondary | ICD-10-CM

## 2013-08-15 DIAGNOSIS — Z9289 Personal history of other medical treatment: Secondary | ICD-10-CM

## 2013-08-15 DIAGNOSIS — Z0000 Encounter for general adult medical examination without abnormal findings: Secondary | ICD-10-CM

## 2013-08-15 DIAGNOSIS — Z8639 Personal history of other endocrine, nutritional and metabolic disease: Secondary | ICD-10-CM

## 2013-08-15 DIAGNOSIS — M7918 Myalgia, other site: Secondary | ICD-10-CM

## 2013-08-15 DIAGNOSIS — K219 Gastro-esophageal reflux disease without esophagitis: Secondary | ICD-10-CM

## 2013-08-15 DIAGNOSIS — Z90710 Acquired absence of both cervix and uterus: Secondary | ICD-10-CM

## 2013-08-15 LAB — POCT URINALYSIS DIPSTICK
BILIRUBIN UA: NEGATIVE
Blood, UA: NEGATIVE
GLUCOSE UA: NEGATIVE
Ketones, UA: NEGATIVE
NITRITE UA: POSITIVE
Protein, UA: NEGATIVE
Spec Grav, UA: 1.02
UROBILINOGEN UA: NEGATIVE
pH, UA: 5.5

## 2013-08-15 MED ORDER — TRAMADOL HCL 50 MG PO TABS: ORAL_TABLET | ORAL | Status: DC

## 2013-08-15 MED ORDER — TRAZODONE HCL 50 MG PO TABS: 50.0000 mg | ORAL_TABLET | Freq: Every day | ORAL | Status: DC

## 2013-08-15 NOTE — Patient Instructions (Signed)
Continue same meds and return in 6 months 

## 2013-08-15 NOTE — Progress Notes (Signed)
Subjective:    Patient ID: Katherine [name], female    DOB: 07/14/1928, 80 y.o.   MRN: [id number]  HPI  80 year old White Female recovering alcoholic, sober for many years for health maintenance and evaluation of medical issues. She has a history of peptic ulcer disease, hyperlipidemia, depression, B12 deficiency.  She is intolerant of aspirin as it causes GI symptoms. Advil causes nausea.  Past medical history: Hospitalized with severe duodenitis 1995, left radial fracture secondary to a fall in 2006, history of vertigo in November 2007. Urinary or, tract infection July 2010.  Appendectomy 1940, hysterectomy with right oophorectomy for endometriosis 1967. Vagotomy and pyloroplasty 1975. Benign left breast biopsy 1976. Bartholin's cyst excised twice in 1980 and 1982.  History of epidural steroid injections L5-S1 for back pain previously done by Dr. Nickola Majoralton Bethea. History of needle guided excision of right breast calcification in 2008 by Dr. Jamey RipaStreck.  Was found to be anemic and iron deficient in 2014. Underwent upper GI endoscopy showing a stricture but no evidence of bleeding. Had strongly Hemoccult positive stool. Colonoscopy was incomplete due to to tortuous colon. Anemia improved with iron therapy.  Family history: Father died at age 78 with uremia. Mother died at age 78 due to age and alcoholism. One sister died at age 78 of complications of alcoholism. Another sister died at age 78 because of alcoholism and cancer.  Social history: Patient has 2 adult daughters in good health. Patient completed high school and 2 years of college. 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 Patient smoked for over 40 years. She and her husband are very active in Alcoholics Anonymous. This is her second marriage. No children from second marriage. She has 3 stepchildren.    Review of Systems  Constitutional: Positive for fatigue.  Respiratory:       Some shortness of breath likely due to  COPD  Gastrointestinal:       GE reflux and peptic ulcer disease  Hematological:       History of iron deficiency anemia  Psychiatric/Behavioral:       History of depression       Objective:   Physical Exam  Vitals reviewed. Constitutional: She is oriented to person, place, and time. She appears well-developed and well-nourished. No distress.  HENT:  Head: Normocephalic and atraumatic.  Right Ear: External ear normal.  Left Ear: External ear normal.  Mouth/Throat: Oropharynx is clear and moist. No oropharyngeal exudate.  Eyes: Conjunctivae and EOM are normal. Pupils are equal, round, and reactive to light. Right eye exhibits no discharge. Left eye exhibits no discharge. No scleral icterus.  Neck: Neck supple. No JVD present. No thyromegaly present.  Cardiovascular: Normal rate, regular rhythm, normal heart sounds and intact distal pulses.   No murmur heard. Pulmonary/Chest: Effort normal and breath sounds normal. No respiratory distress. She has no wheezes. She has no rales.  Breasts normal female  Abdominal: Soft. Bowel sounds are normal. She exhibits no distension and no mass. There is no tenderness. There is no rebound and no guarding.  Genitourinary:  Last Pap smear 2010. Bimanual normal. Do not have to repeat Pap due to age. History of hysterectomy and right oophorectomy.  Musculoskeletal: Normal range of motion. She exhibits no edema.  Lymphadenopathy:    She has no cervical adenopathy.  Neurological: She is alert and oriented to person, place, and time. She has normal reflexes. No cranial nerve deficit.  Skin: Skin is warm and dry. No rash noted.  She is not diaphoretic.  Psychiatric: She has a normal mood and affect. Her behavior is normal. Judgment and thought content normal.          Assessment & Plan:  Hyperlipidemia-stable on statin therapy  Depression-stable on therapy with SSRI  History of GE reflux-status post dilatation of stricture 2014. Is on  PPI  History of iron deficiency anemia status post colonoscopy and endoscopy 2014 continue to monitor  History B12 deficiency  COPD-stable but likely cause of fatigue and shortness of breath  History of vagotomy and pyloroplasty  History of H. pylori infection  Recovering alcoholic in remission-sober for many years  History of smoking  History of hysterectomy and right in the oophorectomy  History of musculoskeletal pain treated with tramadol  Abnormal urinalysis. Culture taken. Addendum: Has Escherichia coli UTI treated with Cipro  Plan: Return in 6 months for office visit lipid panel, liver functions ,CBC. Continue same medications. Samples of antidepressant given    Subjective:   Patient presents for Medicare Annual/Subsequent preventive examination.   Review Past Medical/Family/Social: see above   Risk Factors  Current exercise habits: sedentary Dietary issues discussed: low fat low carb  Cardiac risk factors: hyperlipidemia  Depression Screen  (Note: if answer to either of the following is "Yes", a more complete depression screening is indicated)   Over the past two weeks, have you felt down, depressed or hopeless? No  Over the past two weeks, have you felt little interest or pleasure in doing things? No Have you lost interest or pleasure in daily life? No Do you often feel hopeless? No Do you cry easily over simple problems? No   Activities of Daily Living  In your present state of health, do you have any difficulty performing the following activities?:   Driving? No  Managing money? No  Feeding yourself? No  Getting from bed to chair? No  Climbing a flight of stairs? Yes- fatigue/copd Preparing food and eating?: sometimes-fatigue  Bathing or showering? No  Getting dressed: No  Getting to the toilet? No  Using the toilet:No  Moving around from place to place: No  In the past year have you fallen or had a near fall?:No  Are you sexually active? No   Do you have more than one partner? No   Hearing Difficulties: No  Do you often ask people to speak up or repeat themselves? No  Do you experience ringing or noises in your ears? No  Do you have difficulty understanding soft or whispered voices? No  Do you feel that you have a problem with memory? No Do you often misplace items? No    Home Safety:  Do you have a smoke alarm at your residence? Yes Do you have grab bars in the bathroom? yes Do you have throw rugs in your house? no   Cognitive Testing  Alert? Yes Normal Appearance?Yes  Oriented to person? Yes Place? Yes  Time? Yes  Recall of three objects? Yes  Can perform simple calculations? Yes  Displays appropriate judgment?Yes  Can read the correct time from a watch face?Yes   List the Names of Other Physician/Practitioners you currently use:  See referral list for the physicians patient is currently seeing.  Dr. Dennie Fetters   Review of Systems: see above   Objective:     General appearance: Appears stated age  Head: Normocephalic, without obvious abnormality, atraumatic  Eyes: conj clear, EOMi PEERLA  Ears: normal TM's and external ear canals both ears  Nose: Nares normal.  Septum midline. Mucosa normal. No drainage or sinus tenderness.  Throat: lips, mucosa, and tongue normal; teeth and gums normal  Neck: no adenopathy, no carotid bruit, no JVD, supple, symmetrical, trachea midline and thyroid not enlarged, symmetric, no tenderness/mass/nodules  No CVA tenderness.  Lungs: clear to auscultation bilaterally  Breasts: normal appearance, no masses or tenderness Heart: regular rate and rhythm, S1, S2 normal, no murmur, click, rub or gallop  Abdomen: soft, non-tender; bowel sounds normal; no masses, no organomegaly  Musculoskeletal: ROM normal in all joints, no crepitus, no deformity, Normal muscle strengthen. Back  is symmetric, no curvature. Skin: Skin color, texture, turgor normal. No rashes or lesions  Lymph nodes:  Cervical, supraclavicular, and axillary nodes normal.  Neurologic: CN 2 -12 Normal, Normal symmetric reflexes. Normal coordination and gait  Psych: Alert & Oriented x 3, Mood appear stable.    Assessment:    Annual wellness medicare exam   Plan:    During the course of the visit the patient was educated and counseled about appropriate screening and preventive services including:   Annual mammogram     Patient Instructions (the written plan) was given to the patient.  Medicare Attestation  I have personally reviewed:  The patient's medical and social history  Their use of alcohol, tobacco or illicit drugs  Their current medications and supplements  The patient's functional ability including ADLs,fall risks, home safety risks, cognitive, and hearing and visual impairment  Diet and physical activities  Evidence for depression or mood disorders  The patient's weight, height, BMI, and visual acuity have been recorded in the chart. I have made referrals, counseling, and provided education to the patient based on review of the above and I have provided the patient with a written personalized care plan for preventive services.

## 2013-08-17 LAB — URINE CULTURE

## 2013-08-18 ENCOUNTER — Telehealth: Payer: Self-pay | Admitting: Internal Medicine

## 2013-08-18 ENCOUNTER — Other Ambulatory Visit: Payer: Self-pay

## 2013-08-18 MED ORDER — CIPROFLOXACIN HCL 500 MG PO TABS: 500.0000 mg | ORAL_TABLET | Freq: Two times a day (BID) | ORAL | Status: DC

## 2013-08-18 NOTE — Telephone Encounter (Signed)
Patient informed of Urine culture/ shows UTI. Cipro 500mg  1 po bid sent to [facility]. Will return in 2 weeks for urine recheck only.

## 2013-08-18 NOTE — Telephone Encounter (Signed)
Patient has Escherichia coli UTI sensitive to Cipro.  Call in Cipro 500 mg twice daily for 10 days. Repeat urinalysis in 2 weeks without M.D. visit.

## 2013-08-19 ENCOUNTER — Other Ambulatory Visit: Payer: Self-pay

## 2013-08-19 MED ORDER — TRAZODONE HCL 50 MG PO TABS: 50.0000 mg | ORAL_TABLET | Freq: Every day | ORAL | Status: DC

## 2013-08-22 ENCOUNTER — Ambulatory Visit
Admission: RE | Admit: 2013-08-22 | Discharge: 2013-08-22 | Disposition: A | Discharge status: Home / Self Care | Payer: Medicare PPO | Source: Ambulatory Visit | Attending: Internal Medicine | Admitting: Internal Medicine

## 2013-08-22 DIAGNOSIS — Z0000 Encounter for general adult medical examination without abnormal findings: Secondary | ICD-10-CM

## 2013-08-24 ENCOUNTER — Encounter: Payer: Self-pay | Admitting: Internal Medicine

## 2013-08-30 ENCOUNTER — Other Ambulatory Visit (INDEPENDENT_AMBULATORY_CARE_PROVIDER_SITE_OTHER): Payer: Medicare PPO | Admitting: Internal Medicine

## 2013-08-30 VITALS — Temp 98.0°F

## 2013-08-30 DIAGNOSIS — N390 Urinary tract infection, site not specified: Secondary | ICD-10-CM

## 2013-08-30 DIAGNOSIS — B9689 Other specified bacterial agents as the cause of diseases classified elsewhere: Secondary | ICD-10-CM

## 2013-08-30 DIAGNOSIS — A499 Bacterial infection, unspecified: Secondary | ICD-10-CM

## 2013-08-30 LAB — POCT URINALYSIS DIPSTICK
Bilirubin, UA: NEGATIVE
Blood, UA: NEGATIVE
Glucose, UA: NEGATIVE
Ketones, UA: NEGATIVE
LEUKOCYTES UA: NEGATIVE
NITRITE UA: NEGATIVE
PH UA: 5.5
PROTEIN UA: NEGATIVE
Spec Grav, UA: >=1.03
Urobilinogen, UA: NEGATIVE

## 2013-08-30 NOTE — Progress Notes (Signed)
Patient comes in for recheck of UTI after finishing antibiotics. She is asymptomatic. No fever. Urine dip is within normal limits.

## 2013-10-12 ENCOUNTER — Other Ambulatory Visit: Payer: Self-pay | Admitting: Internal Medicine

## 2013-10-12 DIAGNOSIS — Z1231 Encounter for screening mammogram for malignant neoplasm of breast: Secondary | ICD-10-CM

## 2013-10-31 ENCOUNTER — Ambulatory Visit (HOSPITAL_COMMUNITY)
Admission: RE | Admit: 2013-10-31 | Discharge: 2013-10-31 | Disposition: A | Discharge status: Home / Self Care | Payer: Medicare PPO | Source: Ambulatory Visit | Attending: Internal Medicine | Admitting: Internal Medicine

## 2013-10-31 DIAGNOSIS — Z1231 Encounter for screening mammogram for malignant neoplasm of breast: Secondary | ICD-10-CM | POA: Insufficient documentation

## 2013-11-08 ENCOUNTER — Other Ambulatory Visit: Payer: Self-pay | Admitting: Internal Medicine

## 2013-11-08 NOTE — Telephone Encounter (Signed)
Refill x 3 months 

## 2014-02-21 ENCOUNTER — Other Ambulatory Visit: Payer: Medicare PPO | Admitting: Internal Medicine

## 2014-02-21 DIAGNOSIS — Z862 Personal history of diseases of the blood and blood-forming organs and certain disorders involving the immune mechanism: Secondary | ICD-10-CM

## 2014-02-21 DIAGNOSIS — E785 Hyperlipidemia, unspecified: Secondary | ICD-10-CM

## 2014-02-21 DIAGNOSIS — F1021 Alcohol dependence, in remission: Secondary | ICD-10-CM

## 2014-02-21 LAB — CBC WITH DIFFERENTIAL/PLATELET
Basophils Absolute: 0.1 10*3/uL (ref 0.0–0.1)
Basophils Relative: 1 % (ref 0–1)
EOS ABS: 0.4 10*3/uL (ref 0.0–0.7)
EOS PCT: 6 % — AB (ref 0–5)
HEMATOCRIT: 36.6 % (ref 36.0–46.0)
Hemoglobin: 12.7 g/dL (ref 12.0–15.0)
LYMPHS ABS: 2 10*3/uL (ref 0.7–4.0)
LYMPHS PCT: 32 % (ref 12–46)
MCH: 28.9 pg (ref 26.0–34.0)
MCHC: 34.7 g/dL (ref 30.0–36.0)
MCV: 83.2 fL (ref 78.0–100.0)
MONO ABS: 0.6 10*3/uL (ref 0.1–1.0)
Monocytes Relative: 10 % (ref 3–12)
Neutro Abs: 3.2 10*3/uL (ref 1.7–7.7)
Neutrophils Relative %: 51 % (ref 43–77)
Platelets: 251 10*3/uL (ref 150–400)
RBC: 4.4 MIL/uL (ref 3.87–5.11)
RDW: 13.8 % (ref 11.5–15.5)
WBC: 6.3 10*3/uL (ref 4.0–10.5)

## 2014-02-21 LAB — HEPATIC FUNCTION PANEL
ALT: 15 U/L (ref 0–35)
AST: 25 U/L (ref 0–37)
Albumin: 4 g/dL (ref 3.5–5.2)
Alkaline Phosphatase: 74 U/L (ref 39–117)
BILIRUBIN INDIRECT: 0.2 mg/dL (ref 0.2–1.2)
BILIRUBIN TOTAL: 0.3 mg/dL (ref 0.2–1.2)
Bilirubin, Direct: 0.1 mg/dL (ref 0.0–0.3)
Total Protein: 6.8 g/dL (ref 6.0–8.3)

## 2014-02-21 LAB — LIPID PANEL
CHOL/HDL RATIO: 2.2 ratio
Cholesterol: 152 mg/dL (ref 0–200)
HDL: 70 mg/dL (ref >39)
LDL Cholesterol: 64 mg/dL (ref 0–99)
Triglycerides: 91 mg/dL (ref <150)
VLDL: 18 mg/dL (ref 0–40)

## 2014-02-23 ENCOUNTER — Ambulatory Visit (INDEPENDENT_AMBULATORY_CARE_PROVIDER_SITE_OTHER): Payer: Medicare PPO | Admitting: Internal Medicine

## 2014-02-23 VITALS — BP 138/80 | HR 98 | Ht 60.5 in | Wt 119.0 lb

## 2014-02-23 DIAGNOSIS — K625 Hemorrhage of anus and rectum: Secondary | ICD-10-CM

## 2014-02-23 DIAGNOSIS — R1031 Right lower quadrant pain: Secondary | ICD-10-CM

## 2014-02-23 DIAGNOSIS — G8929 Other chronic pain: Secondary | ICD-10-CM

## 2014-02-23 LAB — CBC WITH DIFFERENTIAL/PLATELET
BASOS PCT: 1 % (ref 0–1)
Basophils Absolute: 0.1 10*3/uL (ref 0.0–0.1)
Eosinophils Absolute: 0.1 10*3/uL (ref 0.0–0.7)
Eosinophils Relative: 2 % (ref 0–5)
HCT: 37.7 % (ref 36.0–46.0)
Hemoglobin: 12.9 g/dL (ref 12.0–15.0)
Lymphocytes Relative: 31 % (ref 12–46)
Lymphs Abs: 2.3 10*3/uL (ref 0.7–4.0)
MCH: 28.9 pg (ref 26.0–34.0)
MCHC: 34.2 g/dL (ref 30.0–36.0)
MCV: 84.3 fL (ref 78.0–100.0)
Monocytes Absolute: 0.4 10*3/uL (ref 0.1–1.0)
Monocytes Relative: 6 % (ref 3–12)
NEUTROS PCT: 60 % (ref 43–77)
Neutro Abs: 4.4 10*3/uL (ref 1.7–7.7)
PLATELETS: 266 10*3/uL (ref 150–400)
RBC: 4.47 MIL/uL (ref 3.87–5.11)
RDW: 14.2 % (ref 11.5–15.5)
WBC: 7.3 10*3/uL (ref 4.0–10.5)

## 2014-02-23 LAB — BASIC METABOLIC PANEL
BUN: 14 mg/dL (ref 6–23)
CALCIUM: 9.2 mg/dL (ref 8.4–10.5)
CHLORIDE: 99 meq/L (ref 96–112)
CO2: 28 meq/L (ref 19–32)
CREATININE: 1.08 mg/dL (ref 0.50–1.10)
Glucose, Bld: 87 mg/dL (ref 70–99)
Potassium: 3.6 mEq/L (ref 3.5–5.3)
Sodium: 137 mEq/L (ref 135–145)

## 2014-02-24 ENCOUNTER — Ambulatory Visit (HOSPITAL_COMMUNITY)
Admission: RE | Admit: 2014-02-24 | Discharge: 2014-02-24 | Disposition: A | Discharge status: Home / Self Care | Payer: Medicare PPO | Source: Ambulatory Visit | Attending: Internal Medicine | Admitting: Internal Medicine

## 2014-02-24 ENCOUNTER — Telehealth: Payer: Self-pay

## 2014-02-24 ENCOUNTER — Encounter (HOSPITAL_COMMUNITY): Payer: Self-pay

## 2014-02-24 DIAGNOSIS — R109 Unspecified abdominal pain: Secondary | ICD-10-CM | POA: Insufficient documentation

## 2014-02-24 DIAGNOSIS — K921 Melena: Secondary | ICD-10-CM | POA: Insufficient documentation

## 2014-02-24 MED ORDER — IOHEXOL 300 MG/ML  SOLN
25.0000 mL | INTRAMUSCULAR | Status: AC
  Administered 2014-02-24: 25 mL via ORAL

## 2014-02-24 MED ORDER — IOHEXOL 300 MG/ML  SOLN
100.0000 mL | Freq: Once | INTRAMUSCULAR | Status: AC | PRN
  Administered 2014-02-24: 100 mL via INTRAVENOUS

## 2014-02-24 NOTE — Telephone Encounter (Signed)
MRI of the abdomin focus on the pancreas has been ordered and scheduled.  Appointment is at [facility] 03/02/2014 @ 845pm.  NPO 4 hours before test.  MRI has been approved through [facility].  Patient aware.

## 2014-02-24 NOTE — Telephone Encounter (Signed)
Imaging called to let us know CT was done.  Impression faxed to office.

## 2014-02-24 NOTE — Telephone Encounter (Signed)
Patient still complaining of dark tarry stools. She is not anemic. Stool here yesterday was guaiac negative. CT scan shows hepatic cyst. Possible mass in the pancreas likely cystic. MRI of the pancreas with pancreatic protocol ordered for Sept 17.

## 2014-02-27 ENCOUNTER — Telehealth: Payer: Self-pay

## 2014-02-27 NOTE — Telephone Encounter (Signed)
Called patient to see if she was still having black tarry stool.  Patient says she is not and is feeling better.  Per Dr Renold Genta if patient was not better a CBC was needed and a referral to Dr Johna Roles.  Dr Renold Genta aware.

## 2014-03-02 ENCOUNTER — Other Ambulatory Visit: Payer: Medicare PPO

## 2014-03-02 ENCOUNTER — Ambulatory Visit
Admission: RE | Admit: 2014-03-02 | Discharge: 2014-03-02 | Disposition: A | Discharge status: Home / Self Care | Payer: Medicare PPO | Source: Ambulatory Visit | Attending: Internal Medicine | Admitting: Internal Medicine

## 2014-03-02 DIAGNOSIS — G8929 Other chronic pain: Secondary | ICD-10-CM

## 2014-03-02 DIAGNOSIS — K625 Hemorrhage of anus and rectum: Secondary | ICD-10-CM

## 2014-03-02 DIAGNOSIS — R1031 Right lower quadrant pain: Principal | ICD-10-CM

## 2014-03-02 MED ORDER — GADOBENATE DIMEGLUMINE 529 MG/ML IV SOLN
11.0000 mL | Freq: Once | INTRAVENOUS | Status: AC | PRN
  Administered 2014-03-02: 11 mL via INTRAVENOUS

## 2014-03-03 ENCOUNTER — Telehealth: Payer: Self-pay

## 2014-03-03 NOTE — Telephone Encounter (Signed)
Message copied by Amado Coe on Fri Mar 03, 2014 11:31 AM ------      Message from: Elby Showers      Created: Fri Mar 03, 2014 11:23 AM       Appears to be a benign lesion. However I would like for Dr. Olevia Perches to see her and go over MRI. Please call pt Thanks. ------

## 2014-03-03 NOTE — Telephone Encounter (Signed)
Patient aware of MRI results, she will call Dr Olevia Perches to schedule appointment.

## 2014-03-09 ENCOUNTER — Encounter: Payer: Self-pay | Admitting: *Deleted

## 2014-04-05 ENCOUNTER — Other Ambulatory Visit: Payer: Self-pay | Admitting: Internal Medicine

## 2014-04-11 ENCOUNTER — Ambulatory Visit (INDEPENDENT_AMBULATORY_CARE_PROVIDER_SITE_OTHER): Payer: Medicare PPO | Admitting: Internal Medicine

## 2014-04-11 ENCOUNTER — Encounter: Payer: Self-pay | Admitting: Internal Medicine

## 2014-04-11 ENCOUNTER — Other Ambulatory Visit (INDEPENDENT_AMBULATORY_CARE_PROVIDER_SITE_OTHER): Payer: Medicare PPO

## 2014-04-11 VITALS — BP 112/60 | HR 68 | Ht 60.5 in | Wt 121.4 lb

## 2014-04-11 DIAGNOSIS — Z8639 Personal history of other endocrine, nutritional and metabolic disease: Secondary | ICD-10-CM

## 2014-04-11 DIAGNOSIS — D379 Neoplasm of uncertain behavior of digestive organ, unspecified: Secondary | ICD-10-CM

## 2014-04-11 DIAGNOSIS — R932 Abnormal findings on diagnostic imaging of liver and biliary tract: Secondary | ICD-10-CM

## 2014-04-11 LAB — HEPATIC FUNCTION PANEL
ALK PHOS: 67 U/L (ref 39–117)
ALT: 11 U/L (ref 0–35)
AST: 23 U/L (ref 0–37)
Albumin: 3.6 g/dL (ref 3.5–5.2)
BILIRUBIN TOTAL: 0.6 mg/dL (ref 0.2–1.2)
Bilirubin, Direct: 0.1 mg/dL (ref 0.0–0.3)
Total Protein: 7.5 g/dL (ref 6.0–8.3)

## 2014-04-11 LAB — LIPASE: Lipase: 35 U/L (ref 11.0–59.0)

## 2014-04-11 LAB — AMYLASE: Amylase: 56 U/L (ref 27–131)

## 2014-04-11 LAB — VITAMIN B12: Vitamin B-12: 337 pg/mL (ref 211–911)

## 2014-04-11 NOTE — Patient Instructions (Signed)
Your physician has requested that you go to the basement for the following lab work before leaving today: Amylase, Lipase, CA 19-9, LFT's, B12  You will be due for an ultrasound of your pancreas around 08/2014 for follow up of a pancreatic lesion. We will contact you to schedule when it gets closer to that time.  CC:Dr Cresenciano Lick [place]

## 2014-04-11 NOTE — Progress Notes (Signed)
Katherine [name] June 14, 1929 [id number]  Note: This dictation was prepared with Dragon digital system. Any transcriptional errors that result from this procedure are unintentional.   History of Present Illness:  This is an 80 year old white female who is here to discuss a CT scan  Imaging of pancreatic lesion. In 2015, she underwent a CT scan of the abdomen which showed several stable cystic lesions in the liver measuring 6-7 mm. There was also a low-density lesion at the head of the pancreas measuring 19 mm. Her common bile duct at her main pancreatic duct was normal. She subsequently had an MRI of the pancreas which confirmed 18 mm x 6 mm lesion at the head of the pancreas. The size was the same as compared to her MRI from January 2009 which was done for low back pain. We have seen the patient previously for colorectal screening. She had 2 adenomatous polyps in 2006, no polyps in 2007 or 2010 and her colonoscopy in March 2014 was incomplete due to a torturous narrow sigmoid colon. A subsequent barium enema showed moderately severe diverticulosis with thickened folds in the sigmoid colon. Her upper endoscopy in March 2014 showed a mild esophageal stricture which was dilated to 16 mm. She had a 4 cm hiatal hernia without Cameron erosions. She has a history of iron deficiency anemia which corrected with iron supplements. Her last hemoglobin was 12.9. She also had a low B12 level of 266 taking B12 supplements. She has irregular bowel habits, predominantly constipation with occasional diarrhea.    Past Medical History  Diagnosis Date  . Hyperlipidemia   . PUD (peptic ulcer disease)   . B12 deficiency   . IBS (irritable bowel syndrome)   . Positive PPD   . Asthma   . Allergy   . Anemia   . Hx of adenomatous colonic polyps 2006  . Hepatic cyst 01/30/10  . Diverticulosis   . Hiatal hernia 1985  . Esophageal stricture     Past Surgical History  Procedure Laterality Date  . Appendectomy    .  Abdominal hysterectomy    . Vagotomy and pyloroplasty      in [place], [place]  . Breast biopsy    . Bartholin gland cyst excision      x2  . Bladder repair      Allergies  Allergen Reactions  . Aspirin     REACTION: Hx of ulcers  . Ibuprofen Nausea Only    Family history and social history have been reviewed.  Review of Systems: Negative for abdominal pain positive for constipation and occasional diarrhea. Denies rectal bleeding, admits to occasional solid food dysphagia if she does not chew well  The remainder of the 10 point ROS is negative except as outlined in the H&P  Physical Exam: General Appearance Well developed, in no distress Eyes  Non icteric  HEENT  Non traumatic, normocephalic  Mouth No lesion, tongue papillated, no cheilosis Neck Supple without adenopathy, thyroid not enlarged, no carotid bruits, no JVD Lungs Clear to auscultation bilaterally COR Normal S1, normal S2, regular rhythm, no murmur, quiet precordium Abdomen mildly protuberant. Soft.tender across lower abdomen and left lower quadrant. Rectal not done Extremities  No pedal edema Skin No lesions Neurological Alert and oriented x 3 Psychological Normal mood and affect  Assessment and Plan:   Problem #34 80 year old white female with an 18-19 mm lesion in the head of the pancreas which has been radiographically stable compared to an MRI 6 years ago. The lesion does  not involve the main pancreatic duct. Her common bile duct is normal as well. This lesion should be observed rather than biopsied. I suggest a repeat upper abdominal ultrasound in 6 months which would be at the end of April 2016. Today, we will check an amylase, lipase and CA-19-9 as well as liver function tests.  Problem #2 History of iron and B12 deficiency. She has recently been very tired. We will check a B12 level today.  Problem #3 History of colon polyps. Her last colonoscopy was incomplete in 2014. There are no plans for recall  colonoscopy due to age.   Problem #4 Mild constipation with overflow diarrhea. I suggested milk of magnesia, 1-2 tablespoons 2-3 times a week. She discontinued Metamucil which makes the stools too bulky.     [name] 04/11/2014

## 2014-04-12 LAB — CANCER ANTIGEN 19-9: CA 19 9: 7.5 U/mL (ref <35.0)

## 2014-05-07 ENCOUNTER — Encounter: Payer: Self-pay | Admitting: Internal Medicine

## 2014-05-07 NOTE — Progress Notes (Signed)
   Subjective:    Patient ID: Katherine [name], female    DOB: [birth year], 80 y.o.   MRN: [id number]  HPI  Patient was here for six-month recheck and to review lipid panel but states that she had been having issues with GI bleeding.  She has no abdominal pain. She was seen by Dr. Olevia Perches in March 2014 with hematochezia, esophageal stricture, and iron deficiency anemia. She underwent endoscopy and colonoscopy. Stricture was dilated. She had a tortuous sigmoid [name] and colonoscopy could not be completed beyond 20 cm. Subsequently single contrast barium enema was ordered which was negative. Anemia improved with oral iron therapy.    Review of Systems     Objective:   Physical Exam Abdomen is soft nondistended with very mild tenderness. No rebound tenderness. Bowel sounds are active. Stool is guaiac negative.       Assessment & Plan:  GI bleeding by history  Abdominal pain-possible diverticulitis  History of deficiency anemia 2014  Hyperlipidemia-lipid panel liver functions are stable  Plan: CBC with differential. Proceed with CT of the abdomen and pelvis for further evaluation since she just had colonoscopy and endoscopy last year  35 minutes spent with patient

## 2014-05-18 ENCOUNTER — Other Ambulatory Visit: Payer: Self-pay | Admitting: Physical Medicine and Rehabilitation

## 2014-05-18 DIAGNOSIS — M5441 Lumbago with sciatica, right side: Secondary | ICD-10-CM

## 2014-05-24 ENCOUNTER — Encounter: Payer: Self-pay | Admitting: Internal Medicine

## 2014-05-24 NOTE — Progress Notes (Signed)
Patient received flu vaccine at [facility] some time in October 2015.

## 2014-05-29 ENCOUNTER — Other Ambulatory Visit: Payer: Medicare PPO

## 2014-06-02 ENCOUNTER — Ambulatory Visit
Admission: RE | Admit: 2014-06-02 | Discharge: 2014-06-02 | Disposition: A | Discharge status: Home / Self Care | Payer: Medicare PPO | Source: Ambulatory Visit | Attending: Physical Medicine and Rehabilitation | Admitting: Physical Medicine and Rehabilitation

## 2014-06-02 DIAGNOSIS — M5441 Lumbago with sciatica, right side: Secondary | ICD-10-CM

## 2014-06-15 ENCOUNTER — Other Ambulatory Visit: Payer: Self-pay | Admitting: Internal Medicine

## 2014-06-30 ENCOUNTER — Other Ambulatory Visit: Payer: Self-pay | Admitting: *Deleted

## 2014-06-30 NOTE — Telephone Encounter (Signed)
Patient to pick up samples of her medication

## 2014-06-30 NOTE — Telephone Encounter (Signed)
Patient called and would like refill on her viibyrd

## 2014-07-03 ENCOUNTER — Ambulatory Visit (INDEPENDENT_AMBULATORY_CARE_PROVIDER_SITE_OTHER): Payer: Medicare PPO | Admitting: Internal Medicine

## 2014-07-03 ENCOUNTER — Encounter: Payer: Self-pay | Admitting: Internal Medicine

## 2014-07-03 VITALS — BP 138/68 | HR 84 | Temp 98.0°F | Wt 123.0 lb

## 2014-07-03 DIAGNOSIS — M48061 Spinal stenosis, lumbar region without neurogenic claudication: Secondary | ICD-10-CM

## 2014-07-03 DIAGNOSIS — M5441 Lumbago with sciatica, right side: Secondary | ICD-10-CM

## 2014-07-03 DIAGNOSIS — M4806 Spinal stenosis, lumbar region: Secondary | ICD-10-CM

## 2014-07-03 DIAGNOSIS — F329 Major depressive disorder, single episode, unspecified: Secondary | ICD-10-CM

## 2014-07-03 DIAGNOSIS — F32A Depression, unspecified: Secondary | ICD-10-CM

## 2014-07-03 MED ORDER — ATORVASTATIN CALCIUM 10 MG PO TABS: 10.0000 mg | ORAL_TABLET | Freq: Every day | ORAL | Status: DC

## 2014-07-03 MED ORDER — TRAZODONE HCL 50 MG PO TABS: 100.0000 mg | ORAL_TABLET | Freq: Every day | ORAL | Status: DC

## 2014-07-03 NOTE — Patient Instructions (Signed)
Samples of Viibryd. Has refill on tramadol. Is to see neurosurgeon next week.

## 2014-07-03 NOTE — Progress Notes (Addendum)
   Subjective:    Patient ID: Katherine [name], female    DOB: 29-Apr-1929, 81 y.o.   MRN: [id number]  HPI  81 year-old Female in today to discuss back pain. His been having for some time low back pain with right radiculopathy. Pain is running down the right anterior thigh down right anterior leg. His gotten so bad she really cannot go shopping without having to sit down. Has been seeing Dr. Niel Hummer for years. Has had epidural steroids before. This time she is being referred to Dr. Saintclair Halsted and has an appointment on January 28.   She had recent MRI of the LS-spine ordered by Dr. Niel Hummer. Has new left L3 area of stenosis and same area stenosis right L5 area. This is where she is symptomatic. Says she is unwilling to have stimulator placed for pain control. Says she is willing to consider surgery. Takes Tramadol.       Review of Systems     Objective:   Physical Exam  Not examined. 15 minutes speaking with patient about these issues. Talked briefly about a prednisone trial but I think she wants to wait and see Dr. Saintclair Halsted. Wants 90 days of all medications. This was done today with the exception of Viibryd for which samples were given.      Assessment & Plan:  Spinal stenosis-has tramadol for pain control. Has Flexeril if needed  Hyperlipidemia-treated with statin medication. 90 days prescribed with refills.  Plan: Keep appointment with Dr. Saintclair Halsted for January 28  Over 50% of time spent counseling, reviewing MRI and making recommendations

## 2014-07-21 ENCOUNTER — Other Ambulatory Visit: Payer: Self-pay | Admitting: Neurosurgery

## 2014-07-27 ENCOUNTER — Encounter (HOSPITAL_COMMUNITY)
Admission: RE | Admit: 2014-07-27 | Discharge: 2014-07-27 | Disposition: A | Discharge status: Home / Self Care | Payer: Medicare PPO | Source: Ambulatory Visit | Attending: Neurosurgery | Admitting: Neurosurgery

## 2014-07-27 ENCOUNTER — Encounter (HOSPITAL_COMMUNITY): Payer: Self-pay

## 2014-07-27 DIAGNOSIS — Z01810 Encounter for preprocedural cardiovascular examination: Secondary | ICD-10-CM | POA: Diagnosis not present

## 2014-07-27 DIAGNOSIS — R9431 Abnormal electrocardiogram [ECG] [EKG]: Secondary | ICD-10-CM | POA: Insufficient documentation

## 2014-07-27 DIAGNOSIS — M4310 Spondylolisthesis, site unspecified: Secondary | ICD-10-CM | POA: Diagnosis not present

## 2014-07-27 DIAGNOSIS — J449 Chronic obstructive pulmonary disease, unspecified: Secondary | ICD-10-CM | POA: Insufficient documentation

## 2014-07-27 DIAGNOSIS — Z0183 Encounter for blood typing: Secondary | ICD-10-CM | POA: Insufficient documentation

## 2014-07-27 DIAGNOSIS — Z87891 Personal history of nicotine dependence: Secondary | ICD-10-CM | POA: Diagnosis not present

## 2014-07-27 DIAGNOSIS — E785 Hyperlipidemia, unspecified: Secondary | ICD-10-CM | POA: Insufficient documentation

## 2014-07-27 DIAGNOSIS — Z01812 Encounter for preprocedural laboratory examination: Secondary | ICD-10-CM | POA: Insufficient documentation

## 2014-07-27 HISTORY — DX: Reserved for inherently not codable concepts without codable children: IMO0001

## 2014-07-27 HISTORY — DX: Depression, unspecified: F32.A

## 2014-07-27 HISTORY — DX: Other specified postprocedural states: Z98.890

## 2014-07-27 HISTORY — DX: Major depressive disorder, single episode, unspecified: F32.9

## 2014-07-27 HISTORY — DX: Unspecified osteoarthritis, unspecified site: M19.90

## 2014-07-27 HISTORY — DX: Other specified postprocedural states: R11.2

## 2014-07-27 HISTORY — DX: Headache, unspecified: R51.9

## 2014-07-27 HISTORY — DX: Chronic obstructive pulmonary disease, unspecified: J44.9

## 2014-07-27 HISTORY — DX: Headache: R51

## 2014-07-27 LAB — BASIC METABOLIC PANEL
Anion gap: 9 (ref 5–15)
BUN: 17 mg/dL (ref 6–23)
CALCIUM: 9.3 mg/dL (ref 8.4–10.5)
CO2: 27 mmol/L (ref 19–32)
CREATININE: 1.09 mg/dL (ref 0.50–1.10)
Chloride: 104 mmol/L (ref 96–112)
GFR calc Af Amer: 52 mL/min — ABNORMAL LOW (ref >90)
GFR, EST NON AFRICAN AMERICAN: 45 mL/min — AB (ref >90)
GLUCOSE: 95 mg/dL (ref 70–99)
Potassium: 4.1 mmol/L (ref 3.5–5.1)
SODIUM: 140 mmol/L (ref 135–145)

## 2014-07-27 LAB — CBC
HCT: 36.8 % (ref 36.0–46.0)
Hemoglobin: 12 g/dL (ref 12.0–15.0)
MCH: 28.1 pg (ref 26.0–34.0)
MCHC: 32.6 g/dL (ref 30.0–36.0)
MCV: 86.2 fL (ref 78.0–100.0)
PLATELETS: 224 10*3/uL (ref 150–400)
RBC: 4.27 MIL/uL (ref 3.87–5.11)
RDW: 13.1 % (ref 11.5–15.5)
WBC: 5.8 10*3/uL (ref 4.0–10.5)

## 2014-07-27 LAB — TYPE AND SCREEN
ABO/RH(D): O POS
Antibody Screen: NEGATIVE

## 2014-07-27 LAB — ABO/RH: ABO/RH(D): O POS

## 2014-07-27 LAB — SURGICAL PCR SCREEN
MRSA, PCR: NEGATIVE
Staphylococcus aureus: NEGATIVE

## 2014-07-27 NOTE — Pre-Procedure Instructions (Signed)
Nakaila Freeze Sardo  07/27/2014   Your procedure is scheduled on:    Wednesday  08/02/14  Report to [facility] Admitting at 920 AM.  Call this number if you have problems the morning of surgery: [phone]   Remember:   Do not eat food or drink liquids after midnight.   Take these medicines the morning of surgery with A SIP OF WATER:  Albuterol+ proair , famotidine (pepcid), eye drops if needed, tramadol if needed (stop  Vitamin E)   Do not wear jewelry, make-up or nail polish.  Do not wear lotions, powders, or perfumes. You may wear deodorant.  Do not shave 48 hours prior to surgery. Men may shave face and neck.  Do not bring valuables to the hospital.  [facility] is not responsible                  for any belongings or valuables.               Contacts, dentures or bridgework may not be worn into surgery.  Leave suitcase in the car. After surgery it may be brought to your room.  For patients admitted to the hospital, discharge time is determined by your                treatment team.               Patients discharged the day of surgery will not be allowed to drive  home.  Name and phone number of your driver:   Special Instructions: see preparing for surgery instructions   Please read over the following fact sheets that you were given: Pain Booklet, Coughing and Deep Breathing, Blood Transfusion Information, MRSA Information and Surgical Site Infection Prevention

## 2014-07-28 NOTE — Progress Notes (Signed)
Anesthesia Chart Review:  Pt is 81 year old female scheduled for 1 level posterior lumbar fusion on 08/02/2014 with Dr. Saintclair Halsted.   PCP is Dr. Tedra Senegal.   PMH includes: hyperlipidemia, anemia, B12 deficiency, COPD. Former smoker. BMI 23.   Preoperative labs reviewed.    Chest x-ray 08/22/2013 reviewed.  1. Chronic changes suggestive of COPD redemonstrated, as above, without radiographic evidence of acute cardiopulmonary disease. 2. Extensive atherosclerosis.  EKG: Sinus rhythm with Premature atrial complexes. Cannot rule out Anterior infarct, age undetermined  If no changes, I anticipate pt can proceed with surgery as scheduled.   Willeen Cass, FNP-BC [facility]/Anesthesiology Phone: [phone] 07/28/2014 1:39 PM

## 2014-08-01 MED ORDER — DEXAMETHASONE SODIUM PHOSPHATE 10 MG/ML IJ SOLN
10.0000 mg | INTRAMUSCULAR | Status: AC
  Administered 2014-08-02: 8 mg via INTRAVENOUS
  Filled 2014-08-01: qty 1

## 2014-08-01 MED ORDER — CEFAZOLIN SODIUM-DEXTROSE 2-3 GM-% IV SOLR
2.0000 g | INTRAVENOUS | Status: AC
  Administered 2014-08-02: 2 g via INTRAVENOUS
  Filled 2014-08-01: qty 50

## 2014-08-02 ENCOUNTER — Encounter (HOSPITAL_COMMUNITY): Payer: Self-pay | Admitting: Anesthesiology

## 2014-08-02 ENCOUNTER — Inpatient Hospital Stay (HOSPITAL_COMMUNITY): Payer: Medicare PPO | Admitting: Emergency Medicine

## 2014-08-02 ENCOUNTER — Encounter (HOSPITAL_COMMUNITY)
Admission: RE | Disposition: A | Discharge status: Home / Self Care | Payer: Self-pay | Source: Ambulatory Visit | Attending: Neurosurgery

## 2014-08-02 ENCOUNTER — Inpatient Hospital Stay (HOSPITAL_COMMUNITY): Payer: Medicare PPO | Admitting: Anesthesiology

## 2014-08-02 ENCOUNTER — Inpatient Hospital Stay (HOSPITAL_COMMUNITY)
Admission: RE | Admit: 2014-08-02 | Discharge: 2014-08-04 | DRG: 460 | Disposition: A | Discharge status: Home / Self Care | Payer: Medicare PPO | Source: Ambulatory Visit | Attending: Neurosurgery | Admitting: Neurosurgery

## 2014-08-02 ENCOUNTER — Inpatient Hospital Stay (HOSPITAL_COMMUNITY): Payer: Medicare PPO

## 2014-08-02 DIAGNOSIS — J45909 Unspecified asthma, uncomplicated: Secondary | ICD-10-CM | POA: Diagnosis present

## 2014-08-02 DIAGNOSIS — Z86010 Personal history of colonic polyps: Secondary | ICD-10-CM | POA: Diagnosis not present

## 2014-08-02 DIAGNOSIS — K5790 Diverticulosis of intestine, part unspecified, without perforation or abscess without bleeding: Secondary | ICD-10-CM | POA: Diagnosis present

## 2014-08-02 DIAGNOSIS — K7689 Other specified diseases of liver: Secondary | ICD-10-CM | POA: Diagnosis present

## 2014-08-02 DIAGNOSIS — Z90710 Acquired absence of both cervix and uterus: Secondary | ICD-10-CM

## 2014-08-02 DIAGNOSIS — R7611 Nonspecific reaction to tuberculin skin test without active tuberculosis: Secondary | ICD-10-CM | POA: Diagnosis present

## 2014-08-02 DIAGNOSIS — Z8711 Personal history of peptic ulcer disease: Secondary | ICD-10-CM

## 2014-08-02 DIAGNOSIS — E538 Deficiency of other specified B group vitamins: Secondary | ICD-10-CM | POA: Diagnosis present

## 2014-08-02 DIAGNOSIS — M5416 Radiculopathy, lumbar region: Secondary | ICD-10-CM | POA: Diagnosis present

## 2014-08-02 DIAGNOSIS — M4806 Spinal stenosis, lumbar region: Secondary | ICD-10-CM | POA: Diagnosis not present

## 2014-08-02 DIAGNOSIS — K589 Irritable bowel syndrome without diarrhea: Secondary | ICD-10-CM | POA: Diagnosis not present

## 2014-08-02 DIAGNOSIS — M1990 Unspecified osteoarthritis, unspecified site: Secondary | ICD-10-CM | POA: Diagnosis present

## 2014-08-02 DIAGNOSIS — M4316 Spondylolisthesis, lumbar region: Secondary | ICD-10-CM | POA: Diagnosis not present

## 2014-08-02 DIAGNOSIS — Z79899 Other long term (current) drug therapy: Secondary | ICD-10-CM | POA: Diagnosis not present

## 2014-08-02 DIAGNOSIS — M4726 Other spondylosis with radiculopathy, lumbar region: Secondary | ICD-10-CM | POA: Diagnosis not present

## 2014-08-02 DIAGNOSIS — E785 Hyperlipidemia, unspecified: Secondary | ICD-10-CM | POA: Diagnosis not present

## 2014-08-02 DIAGNOSIS — R51 Headache: Secondary | ICD-10-CM | POA: Diagnosis present

## 2014-08-02 DIAGNOSIS — K222 Esophageal obstruction: Secondary | ICD-10-CM | POA: Diagnosis present

## 2014-08-02 DIAGNOSIS — Z419 Encounter for procedure for purposes other than remedying health state, unspecified: Secondary | ICD-10-CM

## 2014-08-02 DIAGNOSIS — J449 Chronic obstructive pulmonary disease, unspecified: Secondary | ICD-10-CM | POA: Diagnosis present

## 2014-08-02 DIAGNOSIS — K449 Diaphragmatic hernia without obstruction or gangrene: Secondary | ICD-10-CM | POA: Diagnosis present

## 2014-08-02 DIAGNOSIS — Z87891 Personal history of nicotine dependence: Secondary | ICD-10-CM | POA: Diagnosis not present

## 2014-08-02 DIAGNOSIS — Z888 Allergy status to other drugs, medicaments and biological substances status: Secondary | ICD-10-CM

## 2014-08-02 DIAGNOSIS — F329 Major depressive disorder, single episode, unspecified: Secondary | ICD-10-CM | POA: Diagnosis present

## 2014-08-02 DIAGNOSIS — D649 Anemia, unspecified: Secondary | ICD-10-CM | POA: Diagnosis present

## 2014-08-02 SURGERY — POSTERIOR LUMBAR FUSION 1 LEVEL
Anesthesia: General | Site: Back

## 2014-08-02 MED ORDER — CEFAZOLIN SODIUM 1-5 GM-% IV SOLN
1.0000 g | Freq: Three times a day (TID) | INTRAVENOUS | Status: AC
  Administered 2014-08-02 – 2014-08-03 (×2): 1 g via INTRAVENOUS
  Filled 2014-08-02 (×2): qty 50

## 2014-08-02 MED ORDER — MIDAZOLAM HCL 5 MG/5ML IJ SOLN
INTRAMUSCULAR | Status: DC | PRN
  Administered 2014-08-02: 1 mg via INTRAVENOUS

## 2014-08-02 MED ORDER — LACTATED RINGERS IV SOLN
INTRAVENOUS | Status: DC | PRN
  Administered 2014-08-02 (×2): via INTRAVENOUS

## 2014-08-02 MED ORDER — BUPIVACAINE HCL (PF) 0.25 % IJ SOLN
INTRAMUSCULAR | Status: DC | PRN
  Administered 2014-08-02: 8 mL

## 2014-08-02 MED ORDER — PHENYLEPHRINE HCL 10 MG/ML IJ SOLN
INTRAMUSCULAR | Status: DC | PRN
  Administered 2014-08-02 (×2): 120 ug via INTRAVENOUS
  Administered 2014-08-02: 80 ug via INTRAVENOUS
  Administered 2014-08-02 (×2): 120 ug via INTRAVENOUS

## 2014-08-02 MED ORDER — ROCURONIUM BROMIDE 100 MG/10ML IV SOLN
INTRAVENOUS | Status: DC | PRN
  Administered 2014-08-02: 40 mg via INTRAVENOUS

## 2014-08-02 MED ORDER — ROCURONIUM BROMIDE 50 MG/5ML IV SOLN
INTRAVENOUS | Status: AC
  Filled 2014-08-02: qty 1

## 2014-08-02 MED ORDER — HYDROMORPHONE HCL 1 MG/ML IJ SOLN
0.5000 mg | INTRAMUSCULAR | Status: DC | PRN
  Administered 2014-08-02 – 2014-08-03 (×2): 1 mg via INTRAVENOUS
  Filled 2014-08-02 (×3): qty 1

## 2014-08-02 MED ORDER — PROPOFOL 10 MG/ML IV BOLUS
INTRAVENOUS | Status: DC | PRN
  Administered 2014-08-02: 150 mg via INTRAVENOUS

## 2014-08-02 MED ORDER — DOCUSATE SODIUM 100 MG PO CAPS
100.0000 mg | ORAL_CAPSULE | Freq: Two times a day (BID) | ORAL | Status: DC
  Administered 2014-08-02 – 2014-08-04 (×4): 100 mg via ORAL
  Filled 2014-08-02 (×4): qty 1

## 2014-08-02 MED ORDER — PROPOFOL 10 MG/ML IV BOLUS
INTRAVENOUS | Status: AC
  Filled 2014-08-02: qty 20

## 2014-08-02 MED ORDER — ACETAMINOPHEN 650 MG RE SUPP: 650.0000 mg | RECTAL | Status: DC | PRN

## 2014-08-02 MED ORDER — ACETAMINOPHEN 325 MG PO TABS: 650.0000 mg | ORAL_TABLET | ORAL | Status: DC | PRN

## 2014-08-02 MED ORDER — PHENOL 1.4 % MT LIQD: 1.0000 | OROMUCOSAL | Status: DC | PRN

## 2014-08-02 MED ORDER — MIDAZOLAM HCL 2 MG/2ML IJ SOLN
INTRAMUSCULAR | Status: AC
  Filled 2014-08-02: qty 2

## 2014-08-02 MED ORDER — VITAMIN D 1000 UNITS PO TABS
1000.0000 [IU] | ORAL_TABLET | Freq: Every day | ORAL | Status: DC
  Administered 2014-08-03 – 2014-08-04 (×2): 1000 [IU] via ORAL
  Filled 2014-08-02 (×2): qty 1

## 2014-08-02 MED ORDER — LIDOCAINE HCL (CARDIAC) 20 MG/ML IV SOLN
INTRAVENOUS | Status: DC | PRN
  Administered 2014-08-02: 60 mg via INTRAVENOUS

## 2014-08-02 MED ORDER — ALUM & MAG HYDROXIDE-SIMETH 200-200-20 MG/5ML PO SUSP: 30.0000 mL | Freq: Four times a day (QID) | ORAL | Status: DC | PRN

## 2014-08-02 MED ORDER — THROMBIN 20000 UNITS EX SOLR
CUTANEOUS | Status: DC | PRN
  Administered 2014-08-02: 11:00:00 via TOPICAL

## 2014-08-02 MED ORDER — ONDANSETRON HCL 4 MG/2ML IJ SOLN
INTRAMUSCULAR | Status: AC
  Filled 2014-08-02: qty 2

## 2014-08-02 MED ORDER — TRAMADOL HCL 50 MG PO TABS
50.0000 mg | ORAL_TABLET | Freq: Four times a day (QID) | ORAL | Status: DC
  Administered 2014-08-02 – 2014-08-04 (×9): 50 mg via ORAL
  Filled 2014-08-02 (×9): qty 1

## 2014-08-02 MED ORDER — BACITRACIN 50000 UNITS IM SOLR
INTRAMUSCULAR | Status: DC | PRN
  Administered 2014-08-02: 11:00:00

## 2014-08-02 MED ORDER — ALBUTEROL SULFATE (2.5 MG/3ML) 0.083% IN NEBU: 2.5000 mg | INHALATION_SOLUTION | Freq: Four times a day (QID) | RESPIRATORY_TRACT | Status: DC | PRN

## 2014-08-02 MED ORDER — GLYCOPYRROLATE 0.2 MG/ML IJ SOLN
INTRAMUSCULAR | Status: DC | PRN
  Administered 2014-08-02: 0.4 mg via INTRAVENOUS

## 2014-08-02 MED ORDER — CYCLOBENZAPRINE HCL 10 MG PO TABS
10.0000 mg | ORAL_TABLET | Freq: Three times a day (TID) | ORAL | Status: DC | PRN
  Administered 2014-08-02 – 2014-08-04 (×3): 10 mg via ORAL
  Filled 2014-08-02 (×3): qty 1

## 2014-08-02 MED ORDER — HYDROMORPHONE HCL 1 MG/ML IJ SOLN
0.2500 mg | INTRAMUSCULAR | Status: DC | PRN
  Administered 2014-08-02 (×4): 0.5 mg via INTRAVENOUS

## 2014-08-02 MED ORDER — HYDROMORPHONE HCL 1 MG/ML IJ SOLN
INTRAMUSCULAR | Status: AC
  Administered 2014-08-03: 1 mg
  Filled 2014-08-02: qty 1

## 2014-08-02 MED ORDER — LIDOCAINE-EPINEPHRINE 1 %-1:100000 IJ SOLN
INTRAMUSCULAR | Status: DC | PRN
  Administered 2014-08-02: 8 mL

## 2014-08-02 MED ORDER — MENTHOL 3 MG MT LOZG: 1.0000 | LOZENGE | OROMUCOSAL | Status: DC | PRN

## 2014-08-02 MED ORDER — PROMETHAZINE HCL 25 MG/ML IJ SOLN: 6.2500 mg | INTRAMUSCULAR | Status: DC | PRN

## 2014-08-02 MED ORDER — ALBUTEROL SULFATE HFA 108 (90 BASE) MCG/ACT IN AERS: 2.0000 | INHALATION_SPRAY | RESPIRATORY_TRACT | Status: DC | PRN

## 2014-08-02 MED ORDER — ONDANSETRON HCL 4 MG/2ML IJ SOLN
INTRAMUSCULAR | Status: DC | PRN
Start: 2014-08-02 — End: 2014-08-02
  Administered 2014-08-02: 4 mg via INTRAVENOUS

## 2014-08-02 MED ORDER — SODIUM CHLORIDE 0.9 % IJ SOLN
3.0000 mL | Freq: Two times a day (BID) | INTRAMUSCULAR | Status: DC
  Administered 2014-08-02 – 2014-08-04 (×3): 3 mL via INTRAVENOUS

## 2014-08-02 MED ORDER — FENTANYL CITRATE 0.05 MG/ML IJ SOLN
INTRAMUSCULAR | Status: AC
  Filled 2014-08-02: qty 5

## 2014-08-02 MED ORDER — ATORVASTATIN CALCIUM 10 MG PO TABS
10.0000 mg | ORAL_TABLET | Freq: Every day | ORAL | Status: DC
  Administered 2014-08-03 – 2014-08-04 (×2): 10 mg via ORAL
  Filled 2014-08-02 (×2): qty 1

## 2014-08-02 MED ORDER — FAMOTIDINE 20 MG PO TABS: 40.0000 mg | ORAL_TABLET | Freq: Every evening | ORAL | Status: DC | PRN

## 2014-08-02 MED ORDER — TRAZODONE HCL 100 MG PO TABS
100.0000 mg | ORAL_TABLET | Freq: Every day | ORAL | Status: DC
  Administered 2014-08-02 – 2014-08-03 (×2): 100 mg via ORAL
  Filled 2014-08-02 (×2): qty 1

## 2014-08-02 MED ORDER — VILAZODONE HCL 40 MG PO TABS
40.0000 mg | ORAL_TABLET | Freq: Every day | ORAL | Status: DC
  Administered 2014-08-02 – 2014-08-04 (×3): 40 mg via ORAL
  Filled 2014-08-02 (×3): qty 1

## 2014-08-02 MED ORDER — NEOSTIGMINE METHYLSULFATE 10 MG/10ML IV SOLN
INTRAVENOUS | Status: DC | PRN
  Administered 2014-08-02: 3 mg via INTRAVENOUS

## 2014-08-02 MED ORDER — LIDOCAINE HCL (CARDIAC) 20 MG/ML IV SOLN
INTRAVENOUS | Status: AC
  Filled 2014-08-02: qty 5

## 2014-08-02 MED ORDER — PHENYLEPHRINE HCL 10 MG/ML IJ SOLN
10.0000 mg | INTRAVENOUS | Status: DC | PRN
  Administered 2014-08-02: 25 ug/min via INTRAVENOUS

## 2014-08-02 MED ORDER — ACETAMINOPHEN 10 MG/ML IV SOLN
INTRAVENOUS | Status: AC
  Administered 2014-08-02: 1000 mg via INTRAVENOUS
  Filled 2014-08-02: qty 100

## 2014-08-02 MED ORDER — 0.9 % SODIUM CHLORIDE (POUR BTL) OPTIME
TOPICAL | Status: DC | PRN
  Administered 2014-08-02: 1000 mL

## 2014-08-02 MED ORDER — SODIUM CHLORIDE 0.9 % IJ SOLN: 3.0000 mL | INTRAMUSCULAR | Status: DC | PRN

## 2014-08-02 MED ORDER — SODIUM CHLORIDE 0.9 % IV SOLN: 250.0000 mL | INTRAVENOUS | Status: DC

## 2014-08-02 MED ORDER — LACTATED RINGERS IV SOLN
INTRAVENOUS | Status: DC
  Administered 2014-08-02: 10:00:00 via INTRAVENOUS

## 2014-08-02 MED ORDER — ONDANSETRON HCL 4 MG/2ML IJ SOLN: 4.0000 mg | INTRAMUSCULAR | Status: DC | PRN

## 2014-08-02 MED ORDER — CYCLOBENZAPRINE HCL 10 MG PO TABS
ORAL_TABLET | ORAL | Status: AC
  Administered 2014-08-02: 10 mg via ORAL
  Filled 2014-08-02: qty 1

## 2014-08-02 MED ORDER — HYDROMORPHONE HCL 1 MG/ML IJ SOLN
INTRAMUSCULAR | Status: AC
  Administered 2014-08-02: 0.5 mg via INTRAVENOUS
  Filled 2014-08-02: qty 1

## 2014-08-02 MED ORDER — FENTANYL CITRATE 0.05 MG/ML IJ SOLN
INTRAMUSCULAR | Status: DC | PRN
  Administered 2014-08-02 (×5): 50 ug via INTRAVENOUS

## 2014-08-02 SURGICAL SUPPLY — 76 items
ALLOSTEM STRIP 20MMX50MM (Tissue) ×2 IMPLANT
APL SKNCLS STERI-STRIP NONHPOA (GAUZE/BANDAGES/DRESSINGS) ×1
BAG DECANTER FOR FLEXI CONT (MISCELLANEOUS) ×3 IMPLANT
BENZOIN TINCTURE PRP APPL 2/3 (GAUZE/BANDAGES/DRESSINGS) ×3 IMPLANT
BLADE CLIPPER SURG (BLADE) IMPLANT
BLADE SURG 11 STRL SS (BLADE) ×3 IMPLANT
BONE ALLOSTEM MORSELIZED 5CC (Bone Implant) ×2 IMPLANT
BRUSH SCRUB EZ PLAIN DRY (MISCELLANEOUS) ×3 IMPLANT
BUR MATCHSTICK NEURO 3.0 LAGG (BURR) ×5 IMPLANT
BUR PRECISION FLUTE 6.0 (BURR) ×3 IMPLANT
CAGE RISE 11-17-15 10X22 (Cage) ×2 IMPLANT
CAGE RISE 11-17-15 10X26 (Cage) ×2 IMPLANT
CANISTER SUCT 3000ML PPV (MISCELLANEOUS) ×3 IMPLANT
CAP LOCKING (Cap) ×8 IMPLANT
CAP LOCKING 5.5 CREO (Cap) IMPLANT
CLOSURE WOUND 1/2 X4 (GAUZE/BANDAGES/DRESSINGS) ×1
CONT SPEC 4OZ CLIKSEAL STRL BL (MISCELLANEOUS) ×6 IMPLANT
COVER BACK TABLE 60X90IN (DRAPES) ×3 IMPLANT
DECANTER SPIKE VIAL GLASS SM (MISCELLANEOUS) ×3 IMPLANT
DRAPE C-ARM 42X72 X-RAY (DRAPES) ×6 IMPLANT
DRAPE C-ARMOR (DRAPES) ×2 IMPLANT
DRAPE LAPAROTOMY 100X72X124 (DRAPES) ×3 IMPLANT
DRAPE POUCH INSTRU U-SHP 10X18 (DRAPES) ×3 IMPLANT
DRAPE PROXIMA HALF (DRAPES) IMPLANT
DRAPE SURG 17X23 STRL (DRAPES) ×3 IMPLANT
DRSG OPSITE 4X5.5 SM (GAUZE/BANDAGES/DRESSINGS) ×4 IMPLANT
DRSG OPSITE POSTOP 4X6 (GAUZE/BANDAGES/DRESSINGS) ×2 IMPLANT
DURAPREP 26ML APPLICATOR (WOUND CARE) ×3 IMPLANT
ELECT REM PT RETURN 9FT ADLT (ELECTROSURGICAL) ×3
ELECTRODE REM PT RTRN 9FT ADLT (ELECTROSURGICAL) ×1 IMPLANT
EVACUATOR 3/16  PVC DRAIN (DRAIN) ×2
EVACUATOR 3/16 PVC DRAIN (DRAIN) ×1 IMPLANT
GAUZE SPONGE 4X4 12PLY STRL (GAUZE/BANDAGES/DRESSINGS) ×3 IMPLANT
GAUZE SPONGE 4X4 16PLY XRAY LF (GAUZE/BANDAGES/DRESSINGS) IMPLANT
GLOVE BIO SURGEON STRL SZ8 (GLOVE) ×6 IMPLANT
GLOVE BIO SURGEON STRL SZ8.5 (GLOVE) ×2 IMPLANT
GLOVE ECLIPSE 6.5 STRL STRAW (GLOVE) ×2 IMPLANT
GLOVE ECLIPSE 7.5 STRL STRAW (GLOVE) IMPLANT
GLOVE EXAM NITRILE LRG STRL (GLOVE) IMPLANT
GLOVE EXAM NITRILE MD LF STRL (GLOVE) IMPLANT
GLOVE EXAM NITRILE XL STR (GLOVE) IMPLANT
GLOVE EXAM NITRILE XS STR PU (GLOVE) IMPLANT
GLOVE INDICATOR 7.0 STRL GRN (GLOVE) ×2 IMPLANT
GLOVE INDICATOR 7.5 STRL GRN (GLOVE) ×2 IMPLANT
GLOVE INDICATOR 8.5 STRL (GLOVE) ×6 IMPLANT
GLOVE OPTIFIT SS 8.0 STRL (GLOVE) ×4 IMPLANT
GOWN STRL REUS W/ TWL LRG LVL3 (GOWN DISPOSABLE) IMPLANT
GOWN STRL REUS W/ TWL XL LVL3 (GOWN DISPOSABLE) ×2 IMPLANT
GOWN STRL REUS W/TWL 2XL LVL3 (GOWN DISPOSABLE) IMPLANT
GOWN STRL REUS W/TWL LRG LVL3 (GOWN DISPOSABLE) ×9
GOWN STRL REUS W/TWL XL LVL3 (GOWN DISPOSABLE) ×12
KIT BASIN OR (CUSTOM PROCEDURE TRAY) ×3 IMPLANT
KIT ROOM TURNOVER OR (KITS) ×3 IMPLANT
LIQUID BAND (GAUZE/BANDAGES/DRESSINGS) ×3 IMPLANT
MILL MEDIUM DISP (BLADE) ×2 IMPLANT
NDL HYPO 25X1 1.5 SAFETY (NEEDLE) ×1 IMPLANT
NEEDLE HYPO 25X1 1.5 SAFETY (NEEDLE) ×3 IMPLANT
NS IRRIG 1000ML POUR BTL (IV SOLUTION) ×3 IMPLANT
PACK LAMINECTOMY NEURO (CUSTOM PROCEDURE TRAY) ×3 IMPLANT
PAD ARMBOARD 7.5X6 YLW CONV (MISCELLANEOUS) ×9 IMPLANT
ROD SPINAL 35MM (Rod) ×4 IMPLANT
SCREW CORT CREO 6.0-5.0X30MM (Screw) ×4 IMPLANT
SCREW MOD 6.0-5.0X35MM (Screw) ×4 IMPLANT
SPONGE LAP 4X18 X RAY DECT (DISPOSABLE) IMPLANT
SPONGE SURGIFOAM ABS GEL 100 (HEMOSTASIS) ×3 IMPLANT
STRIP CLOSURE SKIN 1/2X4 (GAUZE/BANDAGES/DRESSINGS) ×3 IMPLANT
SUT VIC AB 0 CT1 18XCR BRD8 (SUTURE) ×2 IMPLANT
SUT VIC AB 0 CT1 8-18 (SUTURE) ×6
SUT VIC AB 2-0 CT1 18 (SUTURE) ×3 IMPLANT
SUT VICRYL 4-0 PS2 18IN ABS (SUTURE) ×3 IMPLANT
SYR 20ML ECCENTRIC (SYRINGE) ×3 IMPLANT
TOWEL OR 17X24 6PK STRL BLUE (TOWEL DISPOSABLE) ×3 IMPLANT
TOWEL OR 17X26 10 PK STRL BLUE (TOWEL DISPOSABLE) ×3 IMPLANT
TRAY FOLEY CATH 14FRSI W/METER (CATHETERS) ×3 IMPLANT
TULIP CREP AMP 5.5MM (Orthopedic Implant) ×4 IMPLANT
WATER STERILE IRR 1000ML POUR (IV SOLUTION) ×3 IMPLANT

## 2014-08-02 NOTE — Transfer of Care (Signed)
Immediate Anesthesia Transfer of Care Note  Patient: Katherine [name]  Procedure(s) Performed: Procedure(s): POSTERIOR LUMBAR INTERBODY FUSION LUMBAR FOUR-FIVE (N/A)  Patient Location: PACU  Anesthesia Type:General  Level of Consciousness: awake, alert , oriented and patient cooperative  Airway & Oxygen Therapy: Patient Spontanous Breathing and Patient connected to nasal cannula oxygen  Post-op Assessment: Report given to RN, Post -op Vital signs reviewed and stable and Patient moving all extremities  Post vital signs: Reviewed and stable  Last Vitals:  Filed Vitals:   08/02/14 1427  BP: 120/51  Pulse: 83  Temp: 36.5 C  Resp: 13    Complications: No apparent anesthesia complications

## 2014-08-02 NOTE — Op Note (Signed)
Preoperative diagnosis: Grade 1 spondylolisthesis L4-5 with severe lumbar spinal stenosis and lateral recess stenosis with bilateral L4 and L5 radiculopathies right greater than left  Postoperative diagnosis: Same  Procedure: Decompressive lumbar laminectomy L4-5 in excess and requiring more work to would be needed with a standard interbody fusion with complete medial facetectomies and radical foraminotomies of the L4 and L5 nerve roots  #2 posterior lumbar interbody fusion L4-5 using the globus rise expandable cage system packed with locally harvested autograft mixed with allostem  #3 cortical screw fixation L4-5 using the Creo 5.5 modular cortical screw set with 60/50 and 35 mm screws at L4 and 60/50 by 30 mm screws at L5  Surgeon: Dominica Severin Mecca Barga  Asst.: Newman Pies  Anesthesia: Gen.  EBL: Minimal  History of present illness: Patient is a very pleasant 81 year old female is a progress worsening back and bilateral leg pain worse on the right radiating down at L4 and L5 nerve root pattern. Patient was refractory to all forms of conservative treatment imaging findings showed a dynamic grade 1 spondylolisthesis with severe spinal stenosis and by foraminal stenosis the L4 and L5 nerve roots. Due to patient's failed conservative treatment imaging findings progression of clinical syndrome I recommended decompression stabilization procedure at L4-5. I extensively reviewed the risks and benefits of the operation with the patient as well as perioperative course expectations of outcome and alternatives of surgery and she understands and agrees to proceed forward.  Operative procedure: Patient was induced under general anesthesia positioned prone on the Wilson frame her back was prepped and draped in routine sterile fashion preoperative x-ray localize the appropriate level so after infiltration of 10 mL lidocaine with epi a midline incision was made and Bovie light cautery was used to calcification  subperiosteal dissection carried on the lamina of L4 and L5 confirmed by interoperative x-ray bilaterally. Then using condition of AP and lateral fluoroscopy the L4 screws were placed using power holes drilled at the 5:00 and 7:00 position respectively at the L4 pedicle these holes were drilled and a superior lateral trajectory to proximally 30 mm the were probed With a 50 tap probed again and 60/50 by 35 mm screws were inserted L4. Then the inferior aspect of the lamina pars was drilled away proximal me 2 mm inferior to the screw placement and decompression was begun with removal of spinous processes at L4 complete central decompression complete medial facetectomies were performed radical foraminotomies performed the L4 and L5 nerve root there was marked stenosis and compression of the L4 nerve root for marked facet arthropathy and the slip. The 5 in her nerve root was also under severe amount of compression from arthropathy as well. After adequate and aggressive foraminotomies been achieved, the space were cleaned out bilaterally with pituitary rongeurs Epstein curettes a rotating cutters with an 11 distractor in place a 10 mm x 26 mm expandable peek cage was inserted on the right after being packed with local autograft mix. This cage was then expanded approximate 5 turns up to about 12 mm. Then the contralateral side was prepped and an endplates were then a similar fashion central disc was removed and a very large central herniation was removed. Then local autograft was packed centrally the contralateral cage was packed and then the allostem sponges were packed laterally to the cages. Then after all interbody work been done the L5 holes were drilled probed and tapped probed again and 60/50 by 30 monitor screws inserted L5 all screws excellent purchase . Postoperative AP and  lateral fluoroscopy confirmed good position of all the implants the head with a with a assembled the wounds and copious irrigated meticulous  in space was maintained the foraminal reinspected confirm patency Gelfoam was overlaid top of the dura then the rods were placed top tightness tightened down the rods were was then closed after placement of a large Hemovac drain in layers with after Vicryl and a running 4 subcuticular benzoin Steri-Strips applied patient recovered in stable condition. At the end the case all needle counts sponge counts were correct.

## 2014-08-02 NOTE — Progress Notes (Signed)
Utilization review completed.  

## 2014-08-02 NOTE — Anesthesia Postprocedure Evaluation (Signed)
  Anesthesia Post-op Note  Patient: Katherine [name]  Procedure(s) Performed: Procedure(s): POSTERIOR LUMBAR INTERBODY FUSION LUMBAR FOUR-FIVE (N/A)  Patient Location: PACU  Anesthesia Type:General  Level of Consciousness: awake  Airway and Oxygen Therapy: Patient Spontanous Breathing  Post-op Pain: mild  Post-op Assessment: Post-op Vital signs reviewed  Post-op Vital Signs: Reviewed  Last Vitals:  Filed Vitals:   08/02/14 1530  BP: 110/50  Pulse: 75  Temp:   Resp: 18    Complications: No apparent anesthesia complications

## 2014-08-02 NOTE — Anesthesia Preprocedure Evaluation (Addendum)
Anesthesia Evaluation  Patient identified by MRN, date of birth, ID band  Airway Mallampati: II  TM Distance: >3 FB Neck ROM: Full    Dental   Pulmonary shortness of breath, asthma , COPDformer smoker,  breath sounds clear to auscultation        Cardiovascular negative cardio ROS  Rhythm:Regular     Neuro/Psych    GI/Hepatic Neg liver ROS, hiatal hernia, PUD, GERD-  ,  Endo/Other    Renal/GU negative Renal ROS     Musculoskeletal   Abdominal   Peds  Hematology   Anesthesia Other Findings   Reproductive/Obstetrics                            Anesthesia Physical Anesthesia Plan  ASA: III  Anesthesia Plan: General   Post-op Pain Management:    Induction: Intravenous  Airway Management Planned: Oral ETT  Additional Equipment:   Intra-op Plan:   Post-operative Plan: Possible Post-op intubation/ventilation  Informed Consent: I have reviewed the patients History and Physical, chart, labs and discussed the procedure including the risks, benefits and alternatives for the proposed anesthesia with the patient or authorized representative who has indicated his/her understanding and acceptance.   Dental advisory given  Plan Discussed with: CRNA, Anesthesiologist and Surgeon  Anesthesia Plan Comments:         Anesthesia Quick Evaluation

## 2014-08-02 NOTE — H&P (Signed)
Katherine [name] is an 81 y.o. female.   Chief Complaint: Back and right leg pain HPI: Patient is a very pleasant 81 year old female is a progress worsening back and right leg pain rating down L4 and L5 disposition the right leg. This is been refractory to all forms of conservative treatment workup revealed an MRI scan which showed severe spinal stenosis both centrally and foraminally with lateral recess stenosis at L4-5. Dynamic x-rays showed instability and motion at L4-5. With a grade 1 spondylolisthesis. Due to patient's failure conservative treatment imaging findings and progression of clinical syndrome I recommended a decompression stabilization procedure at L4-5. I extensively went over the risks and benefits of the operation with the patient as well as perioperative course expectations of outcome and alternatives of surgery and she understands and agrees to proceed forward.  Past Medical History  Diagnosis Date  . Hyperlipidemia   . PUD (peptic ulcer disease)   . B12 deficiency   . IBS (irritable bowel syndrome)   . Positive PPD   . Asthma   . Allergy   . Anemia   . Hx of adenomatous colonic polyps 2006  . Hepatic cyst 01/30/10  . Diverticulosis   . Esophageal stricture   . PONV (postoperative nausea and vomiting)   . COPD (chronic obstructive pulmonary disease)   . Shortness of breath dyspnea     SOB WITH EXERTION   (NOT NEW)  . Depression   . Headache   . Hiatal hernia 1985  . Arthritis     Past Surgical History  Procedure Laterality Date  . Appendectomy    . Abdominal hysterectomy    . Vagotomy and pyloroplasty      in [place], [place]  . Breast biopsy    . Bartholin gland cyst excision      x2  . Bladder repair    . Cataract extraction w/ intraocular lens  implant, bilateral      Family History  Problem Relation Age of Onset  . Ulcers Father   . Stroke Father   . Irritable bowel syndrome Father   . Colon cancer Neg Hx   . Ovarian cancer Sister    Social  History:  reports that she quit smoking about 4 years ago. She has never used smokeless tobacco. She reports that she does not drink alcohol or use illicit drugs.  Allergies:  Allergies  Allergen Reactions  . Aspirin     REACTION: Hx of ulcers  . Ibuprofen Nausea Only    Medications Prior to Admission  Medication Sig Dispense Refill  . albuterol (PROVENTIL) (2.5 MG/3ML) 0.083% nebulizer solution Take 2.5 mg by nebulization every 6 (six) hours as needed.      Marland Kitchen atorvastatin (LIPITOR) 10 MG tablet Take 1 tablet (10 mg total) by mouth daily. 90 tablet 3  . B Complex Vitamins (B-COMPLEX/B-12 TR PO) Take 1 tablet by mouth daily.    . cholecalciferol (VITAMIN D) 1000 UNITS tablet Take 1,000 Units by mouth daily.    . cyclobenzaprine (FLEXERIL) 10 MG tablet Take 1 tablet (10 mg total) by mouth 3 (three) times daily as needed. 30 tablet 5  . famotidine (PEPCID) 40 MG tablet Take 1 tablet (40 mg total) by mouth at bedtime as needed for heartburn. 30 tablet 3  . IRON PO Take 1 tablet by mouth daily.    [name] (SYSTANE OP) Apply 1 drop to eye as needed (dry eyes).    Marland Kitchen PROAIR HFA 108 (90 BASE) MCG/ACT  inhaler INHALE 2 PUFFS BY MOUTH EVERY 4 HOURS AS NEEDED 8.5 each 3  . traMADol (ULTRAM) 50 MG tablet TAKE 1 TABLET BY MOUTH EVERY 8 HOURS AS NEEDED FOR PAIN 60 tablet 0  . traZODone (DESYREL) 50 MG tablet Take 2 tablets (100 mg total) by mouth at bedtime. 180 tablet 3  . Vilazodone HCl (VIIBRYD) 40 MG TABS Take 40 mg by mouth daily.     Marland Kitchen VITAMIN E PO Take 1 tablet by mouth daily.      No results found for this or any previous visit (from the past 48 hour(s)). No results found.  Review of Systems  Constitutional: Negative.   HENT: Negative.   Eyes: Negative.   Respiratory: Negative.   Cardiovascular: Negative.   Gastrointestinal: Negative.   Genitourinary: Negative.   Musculoskeletal: Positive for myalgias, back pain and joint pain.  Skin: Negative.   Neurological:  Positive for tingling and sensory change.  Psychiatric/Behavioral: Negative.     Blood pressure 132/47, pulse 112, temperature 97.5 F (36.4 C), temperature source Oral, resp. rate 20, height 5\' 1"  (1.549 m), weight 54.885 kg (121 lb), SpO2 98 %. Physical Exam  Constitutional: She is oriented to person, place, and time. She appears well-developed and well-nourished.  HENT:  Head: Normocephalic.  Eyes: Pupils are equal, round, and reactive to light.  Neck: Normal range of motion.  Respiratory: Effort normal.  GI: Soft.  Neurological: She is alert and oriented to person, place, and time. She has normal strength. GCS eye subscore is 4. GCS verbal subscore is 5. GCS motor subscore is 6.  Strength is 5 out of 5 in her iliopsoas, quads, and she's, gastric, and tibialis, and EHL.  Skin: Skin is warm and dry.     Assessment/Plan 81 year old female presents for decompression stabilization procedure at L4-5.  Katherine [name] P 08/02/2014, 10:44 AM

## 2014-08-03 NOTE — Progress Notes (Signed)
Pt foley removed. Removed 100cc from hemovac. Discussed and educated pt on back precautions.

## 2014-08-03 NOTE — Evaluation (Signed)
Physical Therapy Evaluation Patient Details Name: Katherine [name] MRN: [id number] DOB: 1929/01/08 Today's Date: 08/03/2014   History of Present Illness  Patient is a very pleasant 81 year old female is a progress worsening back and right leg pain rating down L4 and L5 disposition the right leg. Pt s/p L4-5 PLIF  Clinical Impression  Katherine [name] is moving very well for her first time up and ambulating. She is concerned with using stairmaster at home but cautioned against this until her 2 week follow up. Pt educated for all precautions, transfers, restrictions, brace wear, DME use and stairs. Will follow acutely to maximize independence and safety with all mobility adhering to precautions.      Follow Up Recommendations No PT follow up    Equipment Recommendations  Rolling walker with 5" wheels    Recommendations for Other Services       Precautions / Restrictions Precautions Precautions: Back Precaution Booklet Issued: Yes (comment) Required Braces or Orthoses: Spinal Brace Spinal Brace: Lumbar corset;Applied in sitting position      Mobility  Bed Mobility Overal bed mobility: Needs Assistance Bed Mobility: Rolling;Sidelying to Sit Rolling: Supervision Sidelying to sit: Supervision       General bed mobility comments: cues for sequence and precautions  Transfers Overall transfer level: Needs assistance   Transfers: Sit to/from Stand Sit to Stand: Supervision         General transfer comment: cues for hand placement and posture  Ambulation/Gait Ambulation/Gait assistance: Supervision Ambulation Distance (Feet): 800 Feet Assistive device: Rolling walker (2 wheeled) Gait Pattern/deviations: Step-through pattern;Decreased stride length   Gait velocity interpretation: at or above normal speed for age/gender General Gait Details: cues for use of RW, pt preferred at this time to use due to dizziness  Stairs Stairs: Yes Stairs assistance: Modified independent  (Device/Increase time) Stair Management: Two rails;Alternating pattern;Forwards Number of Stairs: 5    Wheelchair Mobility    Modified Rankin (Stroke Patients Only)       Balance Overall balance assessment: Modified Independent                                           Pertinent Vitals/Pain Pain Assessment: 0-10 Pain Score: 4  Pain Location: incisional Pain Descriptors / Indicators: Aching Pain Intervention(s): Premedicated before session;Repositioned    Home Living Family/patient expects to be discharged to:: Private residence Living Arrangements: Spouse/significant other Available Help at Discharge: Family;Available 24 hours/day Type of Home: House Home Access: Stairs to enter Entrance Stairs-Rails: Can reach both;Left;Right Entrance Stairs-Number of Steps: 7 but split landings so 3 at a time Home Layout: Two level Home Equipment: None      Prior Function Level of Independence: Independent               Hand Dominance        Extremity/Trunk Assessment   Upper Extremity Assessment: Overall WFL for tasks assessed           Lower Extremity Assessment: Overall WFL for tasks assessed      Cervical / Trunk Assessment: Normal  Communication   Communication: No difficulties  Cognition Arousal/Alertness: Awake/alert Behavior During Therapy: WFL for tasks assessed/performed Overall Cognitive Status: Within Functional Limits for tasks assessed                      General Comments      Exercises  Assessment/Plan    PT Assessment Patient needs continued PT services  PT Diagnosis Difficulty walking;Acute pain   PT Problem List Decreased activity tolerance;Decreased knowledge of use of DME;Decreased knowledge of precautions;Pain  PT Treatment Interventions Gait training;Functional mobility training;DME instruction;Therapeutic activities;Patient/family education   PT Goals (Current goals can be found in the Care  Plan section) Acute Rehab PT Goals Patient Stated Goal: return home and to [facility] PT Goal Formulation: With patient Time For Goal Achievement: 08/10/14 Potential to Achieve Goals: Good    Frequency Min 5X/week   Barriers to discharge        Co-evaluation               End of Session Equipment Utilized During Treatment: Back brace Activity Tolerance: Patient tolerated treatment well Patient left: in chair;with family/visitor present;with call bell/phone within reach           Time: 0900-0926 PT Time Calculation (min) (ACUTE ONLY): 26 min   Charges:   PT Evaluation $Initial PT Evaluation Tier I: 1 Procedure PT Treatments $Gait Training: 8-22 mins   PT G CodesMelford Aase 08/03/2014, 9:35 AM Elwyn Reach, [place]

## 2014-08-03 NOTE — Evaluation (Signed)
Occupational Therapy Evaluation and Discharge Summary Patient Details Name: Katherine [name] MRN: [id number] DOB: [birth year] Today's Date: 08/03/2014    History of Present Illness Patient is a very pleasant 81 year old female is a progress worsening back and right leg pain rating down L4 and L5 disposition the right leg. Pt s/p L4-5 PLIF   Clinical Impression   Pt admitted with the above diagnosis and overall is doing very will with her basic adls.  All adl education complete.  Pt has all necessary equipment from OT standpoint.  Waiting on walker per PT.  No further acute OT needs.    Follow Up Recommendations  No OT follow up;Supervision/Assistance - 24 hour    Equipment Recommendations  None recommended by OT    Recommendations for Other Services       Precautions / Restrictions Precautions Precautions: Back Precaution Booklet Issued: Yes (comment) Precaution Comments: reviewed all back precautions at length. Required Braces or Orthoses: Spinal Brace Spinal Brace: Lumbar corset;Applied in sitting position Restrictions Weight Bearing Restrictions: No      Mobility Bed Mobility Overal bed mobility: Needs Assistance Bed Mobility: Rolling;Sidelying to Sit Rolling: Supervision Sidelying to sit: Supervision       General bed mobility comments: pt in chair on arrival.  Transfers Overall transfer level: Needs assistance Equipment used: Rolling walker (2 wheeled) Transfers: Sit to/from Stand Sit to Stand: Supervision         General transfer comment: cues for hand placement and posture    Balance Overall balance assessment: No apparent balance deficits (not formally assessed)                                          ADL Overall ADL's : Needs assistance/impaired Eating/Feeding: Independent   Grooming: Oral care;Wash/dry face;Wash/dry hands;Supervision/safety;Standing   Upper Body Bathing: Set up;Sitting   Lower Body Bathing: Supervison/  safety;Sit to/from stand;Adhering to back precautions Lower Body Bathing Details (indicate cue type and reason): cues to cross legs to wash feet. Upper Body Dressing : Set up;Sitting   Lower Body Dressing: Supervision/safety;Cueing for back precautions;Sit to/from stand Lower Body Dressing Details (indicate cue type and reason): cues to cross legs to donn socks and shoes. Toilet Transfer: Min guard;Ambulation;Comfort height toilet;Grab bars Toilet Transfer Details (indicate cue type and reason): cues to avoid plopping back onto commode.   Toileting- Clothing Manipulation and Hygiene: Supervision/safety;Sit to/from stand;Cueing for back precautions   Tub/ Shower Transfer: Walk-in shower;Supervision/safety;Adhering to back precautions;Ambulation   Functional mobility during ADLs: Supervision/safety;Rolling walker General ADL Comments: Pt did well with all adls. Husband will be there to assist pt as well 24/7.     Vision     Perception     Praxis      Pertinent Vitals/Pain Pain Assessment: 0-10 Pain Score: 5  Pain Location: back Pain Descriptors / Indicators: Aching Pain Intervention(s): Monitored during session;Limited activity within patient's tolerance;Premedicated before session;Repositioned     Hand Dominance Right   Extremity/Trunk Assessment Upper Extremity Assessment Upper Extremity Assessment: Overall WFL for tasks assessed   Lower Extremity Assessment Lower Extremity Assessment: Defer to PT evaluation   Cervical / Trunk Assessment Cervical / Trunk Assessment: Normal   Communication Communication Communication: No difficulties   Cognition Arousal/Alertness: Awake/alert Behavior During Therapy: WFL for tasks assessed/performed Overall Cognitive Status: Within Functional Limits for tasks assessed       Memory: Decreased short-term memory (pt reports  mild STM deficits.)             General Comments       Exercises       Shoulder Instructions       Home Living Family/patient expects to be discharged to:: Private residence Living Arrangements: Spouse/significant other Available Help at Discharge: Family;Available 24 hours/day Type of Home: House Home Access: Stairs to enter [employer] of Steps: 7 but split landings so 3 at a time Entrance Stairs-Rails: Can reach both;Left;Right Home Layout: Two level Alternate Level Stairs-Number of Steps: stair lift that pt uses due to COPD   Bathroom Shower/Tub: Tub/shower unit;Walk-in shower Shower/tub characteristics: Architectural technologist: Handicapped height Bathroom Accessibility: Yes How Accessible: Accessible via walker Home Equipment: Shower seat - built in          Prior Functioning/Environment Level of Independence: Independent             OT Diagnosis: Generalized weakness;Acute pain   OT Problem List:     OT Treatment/Interventions:      OT Goals(Current goals can be found in the care plan section) Acute Rehab OT Goals Patient Stated Goal: return home and to [facility] OT Goal Formulation: All assessment and education complete, DC therapy  OT Frequency:     Barriers to D/C:            Co-evaluation              End of Session Equipment Utilized During Treatment: Rolling walker;Back brace Nurse Communication: Mobility status  Activity Tolerance: Patient tolerated treatment well Patient left: in chair;with call bell/phone within reach   Time: 0927-0947 OT Time Calculation (min): 20 min Charges:  OT General Charges $OT Visit: 1 Procedure OT Evaluation $Initial OT Evaluation Tier I: 1 Procedure G-Codes:    Glenford Peers Aug 16, 2014, 10:07 AM  [phone]

## 2014-08-03 NOTE — Progress Notes (Signed)
Subjective: Patient reports Doing well no leg pain just back pain no new numbness or tingling  Objective: Vital signs in last 24 hours: Temp:  [97.2 F (36.2 C)-97.8 F (36.6 C)] 97.7 F (36.5 C) (02/18 0605) Pulse Rate:  [61-112] 75 (02/18 0605) Resp:  [9-20] 14 (02/18 0605) BP: (89-132)/(26-69) 98/41 mmHg (02/18 0605) SpO2:  [90 %-100 %] 94 % (02/18 0605) Weight:  [54.885 kg (121 lb)] 54.885 kg (121 lb) (02/17 0938)  Intake/Output from previous day: 02/17 0701 - 02/18 0700 In: 2250 [P.O.:150; I.V.:2100] Out: 1595 [Urine:1170; Drains:150; Blood:275] Intake/Output this shift:    Strength 5 out of 5 wound clean dry and intact  Lab Results: No results for input(s): WBC, HGB, HCT, PLT in the last 72 hours. BMET No results for input(s): NA, K, CL, CO2, GLUCOSE, BUN, CREATININE, CALCIUM in the last 72 hours.  Studies/Results: Dg Lumbar Spine 2-3 Views  08/02/2014   CLINICAL DATA:  Lumbar fusion  EXAM: LUMBAR SPINE - 2-3 VIEW  COMPARISON:  07/11/2014  FINDINGS: There are pedicle screws at L4 and L5 with interbody fusion device. Good position and alignment without complicating features.  IMPRESSION: Pedicle screws and interbody fusion device in good position at O1-3 without complicating features.   Electronically Signed   By: Marijo Sanes M.D.   On: 08/02/2014 15:14   Dg C-arm 1-60 Min  08/02/2014   CLINICAL DATA:  C-arm fluoroscopy  EXAM: DG C-ARM 1- 60 MIN  COMPARISON:  Lumbar spine films of 07/11/2014  FINDINGS: C-arm spot films show placement of transpedicular screws at L4 and L5 level. Also an interbody fusion device was placed at L4-5 in good position.  IMPRESSION: C-arm fluoroscopy provided during posterior fusion of L4-5.   Electronically Signed   By: Ivar Drape M.D.   On: 08/02/2014 15:18    Assessment/Plan: Progressive mobilization today with physical and occupational therapy  LOS: 1 day     Katherine [name] P 08/03/2014, 7:42 AM

## 2014-08-04 ENCOUNTER — Encounter: Payer: Self-pay | Admitting: Internal Medicine

## 2014-08-04 ENCOUNTER — Telehealth: Payer: Self-pay | Admitting: Internal Medicine

## 2014-08-04 MED ORDER — TRAMADOL HCL 50 MG PO TABS: 50.0000 mg | ORAL_TABLET | Freq: Four times a day (QID) | ORAL | Status: DC

## 2014-08-04 MED ORDER — CYCLOBENZAPRINE HCL 10 MG PO TABS: 10.0000 mg | ORAL_TABLET | Freq: Three times a day (TID) | ORAL | Status: DC | PRN

## 2014-08-04 NOTE — Discharge Instructions (Signed)
No lifting bending and twisting, no driving, wrap bandage in saran wrap for showers only

## 2014-08-04 NOTE — Telephone Encounter (Signed)
Called yesterday to check in on patient status post lumbar surgery. Has some pain but doing well and is pleased with care at [facility] being discharged today.

## 2014-08-04 NOTE — Progress Notes (Signed)
[name] arrived to pt's bedside. Husband took walker and pt's personal belongings to car. Pt transported out of unit per wheelchair by nurse tech, Estill Bamberg. No acute distress noted. Pt to be transported home per private car by family member.   Katherine [name] I 08/04/2014 3:12 PM

## 2014-08-04 NOTE — Progress Notes (Signed)
Discharge instructions and prescriptions given to pt and pt's husband. Pt verbally acknowledged understanding. Pt to be transported home. Md to sign off DME [email or web address] for insurance purposes. Nurse called MD, Dr. Saintclair Halsted in surgery and left message. Will monitor   Angeline Slim I 08/04/2014 2:09 PM

## 2014-08-04 NOTE — Care Management Note (Addendum)
    Page 1 of 1   08/04/2014     2:28:42 PM CARE MANAGEMENT NOTE 08/04/2014  Patient:  Katherine [name], Katherine [name]   Account Number:  [id number]  Date Initiated:  08/04/2014  Documentation initiated by:  Lorne Skeens  Subjective/Objective Assessment:   Patient was admitted for a PLIF. Lives at home with spouse.     Action/Plan:   Will follow for discharge needs pending PT/OT evals and physician orders   Anticipated DC Date:  08/04/2014   Anticipated DC Plan:  HOME/SELF CARE         Choice offered to / List presented to:     DME arranged  Vassie Moselle      DME agency  [place].        Status of service:  Completed, signed off Medicare Important Message given?  NA - LOS <3 / Initial given by admissions (If response is "NO", the following Medicare IM given date fields will be blank) Date Medicare IM given:   Medicare IM given by:   Date Additional Medicare IM given:   Additional Medicare IM given by:    Discharge Disposition:  HOME/SELF CARE  Per UR Regulation:  Reviewed for med. necessity/level of care/duration of stay  If discussed at [place] of Stay Meetings, dates discussed:    Comments:  08/04/14 [place], MSN, CM- Patient is requesting a rolling walker for discharge home, but is not interested in waiting for a walker to be delivered from an outside company with a [employer].  Patient has chosen to pay out of pocket through [facility], so as not to delay discharge or discharge home and be without it until delivery.  [facility] was notified of need for equipment for discharge home today.

## 2014-08-04 NOTE — Progress Notes (Signed)
Patient ID: Katherine [name], female   DOB: 08-Nov-1928, 81 y.o.   MRN: [id number] Doing well no leg pain back pain seems to be coming under better control. Urinating well  Strength out of 5 wound clean dry and intact  Continue physical therapy today possible discharged later this afternoon.

## 2014-08-04 NOTE — Progress Notes (Signed)
Physical Therapy Treatment Patient Details Name: Katherine [name] MRN: [id number] DOB: [birth year] Today's Date: 08/04/2014    History of Present Illness Patient is a very pleasant 81 year old female is a progress worsening back and right leg pain rating down L4 and L5 disposition the right leg. Pt s/p L4-5 PLIF    PT Comments    Patient progressing well this session. Didn't walk as much due to fatigue but continues to move well. Patient safe to D/C from a mobility standpoint based on progression towards goals set on PT eval.    Follow Up Recommendations  No PT follow up     Equipment Recommendations  [name] with 5" wheels    Recommendations for Other Services       Precautions / Restrictions Precautions Precautions: Back Precaution Comments: Reviewed precautions Required Braces or Orthoses: Spinal Brace Spinal Brace: Lumbar corset;Applied in sitting position Restrictions Weight Bearing Restrictions: No    Mobility  Bed Mobility Overal bed mobility: Needs Assistance   Rolling: Supervision Sidelying to sit: Supervision          Transfers   Equipment used: [name] (2 wheeled)   Sit to Stand: Supervision         General transfer comment: cues for hand placement and safety with positioning  Ambulation/Gait Ambulation/Gait assistance: Min guard Ambulation Distance (Feet): 300 Feet Assistive device: [name] (2 wheeled) Gait Pattern/deviations: Step-through pattern;Decreased stride length   Gait velocity interpretation: at or above normal speed for age/gender General Gait Details: cues for use of RW, pt preferred at this time tofatigue. Attempted short distance without   Stairs   Stairs assistance: Supervision Stair Management: Two rails;Alternating pattern Number of Stairs: 5 General stair comments: Supervision for safety  Wheelchair Mobility    Modified Rankin (Stroke Patients Only)       Balance                                     Cognition Arousal/Alertness: Awake/alert Behavior During Therapy: WFL for tasks assessed/performed Overall Cognitive Status: Within Functional Limits for tasks assessed                      Exercises      General Comments        Pertinent Vitals/Pain Pain Score: 5  Pain Location: back Pain Descriptors / Indicators: Aching;Sore Pain Intervention(s): Monitored during session    Home Living                      Prior Function            PT Goals (current goals can now be found in the care plan section) Progress towards PT goals: Progressing toward goals    Frequency  Min 5X/week    PT Plan Other (comment)    Co-evaluation             End of Session Equipment Utilized During Treatment: Back brace Activity Tolerance: Patient tolerated treatment well Patient left: in bed;with call bell/phone within reach     Time: 1022-1040 PT Time Calculation (min) (ACUTE ONLY): 18 min  Charges:  $Gait Training: 8-22 mins                    G Codes:      Jacqualyn Posey 08/04/2014, 10:44 AM 08/04/2014 Jacqualyn Posey PTA [phone] pager [phone] office

## 2014-08-17 ENCOUNTER — Telehealth: Payer: Self-pay | Admitting: *Deleted

## 2014-08-17 NOTE — Telephone Encounter (Signed)
-----   Message from Larina Bras, [place] sent at 04/11/2014  1:58 PM EDT ----- Needs u/s pancreas only for f/u head of pancreas lesion around 08/2014. See office note from 04/11/14. Call and schedule

## 2014-08-17 NOTE — Telephone Encounter (Signed)
We will contact patient when it gets closer to April.

## 2014-08-23 NOTE — Discharge Summary (Signed)
Physician Discharge Summary  Patient ID: [name] MRN: [id number] DOB/AGE: 1928-12-19 81 y.o.  Admit date: 08/02/2014 Discharge date: 08/04/2014  Admission Diagnoses: L4-5 lumbar spinal stenosis and grade 1 spondylolisthesis  Discharge Diagnoses: Same Active Problems:   Spondylolisthesis at L4-L5 level   Discharged Condition: good  Hospital Course: Patient admitted hospital underwent decompression stabilization procedure postoperative patient did fairly well with recovered in the floor on the floor patient was ambulating and voiding spontaneously and stable for discharge home postop day 2. She'll be scheduled follow-up with neurosurgery and medical medicine.  Consults: Significant Diagnostic Studies: Treatments: L4-5 decompression and fusion Discharge Exam: Blood pressure 112/42, pulse 90, temperature 98.8 F (37.1 C), temperature source Oral, resp. rate 20, height 5\' 1"  (1.549 m), weight 54.885 kg (121 lb), SpO2 93 %. Strength 5 out of 5 wound clean dry and intact  Disposition: Home  Discharge Instructions    For home use only DME 4 wheeled rolling walker with seat    Complete by:  As directed             Medication List    TAKE these medications        albuterol (2.5 MG/3ML) 0.083% nebulizer solution  Commonly known as:  PROVENTIL  Take 2.5 mg by nebulization every 6 (six) hours as needed.     PROAIR HFA 108 (90 BASE) MCG/ACT inhaler  Generic drug:  albuterol  INHALE 2 PUFFS BY MOUTH EVERY 4 HOURS AS NEEDED     atorvastatin 10 MG tablet  Commonly known as:  LIPITOR  Take 1 tablet (10 mg total) by mouth daily.     B-COMPLEX/B-12 TR PO  Take 1 tablet by mouth daily.     cholecalciferol 1000 UNITS tablet  Commonly known as:  VITAMIN D  Take 1,000 Units by mouth daily.     cyclobenzaprine 10 MG tablet  Commonly known as:  FLEXERIL  Take 1 tablet (10 mg total) by mouth 3 (three) times daily as needed.     cyclobenzaprine 10 MG tablet  Commonly known  as:  FLEXERIL  Take 1 tablet (10 mg total) by mouth 3 (three) times daily as needed for muscle spasms.     famotidine 40 MG tablet  Commonly known as:  PEPCID  Take 1 tablet (40 mg total) by mouth at bedtime as needed for heartburn.     IRON PO  Take 1 tablet by mouth daily.     SYSTANE OP  Apply 1 drop to eye as needed (dry eyes).     traMADol 50 MG tablet  Commonly known as:  ULTRAM  TAKE 1 TABLET BY MOUTH EVERY 8 HOURS AS NEEDED FOR PAIN     traMADol 50 MG tablet  Commonly known as:  ULTRAM  Take 1 tablet (50 mg total) by mouth every 6 (six) hours.     traZODone 50 MG tablet  Commonly known as:  DESYREL  Take 2 tablets (100 mg total) by mouth at bedtime.     VIIBRYD 40 MG Tabs  Generic drug:  Vilazodone HCl  Take 40 mg by mouth daily.     VITAMIN E PO  Take 1 tablet by mouth daily.           Follow-up Information    Follow up with [facility], MD.   Specialty:  Neurosurgery   Contact information:   [address] [place] [address] [place] [address] [phone]       Signed: Elaina Hoops 08/23/2014, 8:25  AM

## 2014-09-05 ENCOUNTER — Other Ambulatory Visit: Payer: Self-pay | Admitting: Internal Medicine

## 2014-09-18 ENCOUNTER — Telehealth: Payer: Self-pay | Admitting: *Deleted

## 2014-09-18 DIAGNOSIS — K869 Disease of pancreas, unspecified: Secondary | ICD-10-CM

## 2014-09-18 NOTE — Telephone Encounter (Signed)
Patient has been scheduled for limited abdominal ultrasound (attn pancreas) on 09/20/14 @ 2 pm [facility] radiology. She is to be NPO 6-8 hours prior to test. I have left message for patient to call back.

## 2014-09-18 NOTE — Telephone Encounter (Signed)
-----   Message from Larina Bras, [place] sent at 08/17/2014  8:31 AM EST ----- See 3/3/ phone note   ----- Message -----    From: Larina Bras, CMA    Sent: 08/17/2014      To: Larina Bras, CMA  Needs u/s pancreas only for f/u head of pancreas lesion around 08/2014. See office note from 04/11/14. Call and schedule

## 2014-09-19 NOTE — Telephone Encounter (Signed)
Patient advised of information below. She verbalizes understanding. 

## 2014-09-20 ENCOUNTER — Ambulatory Visit (HOSPITAL_COMMUNITY)
Admission: RE | Admit: 2014-09-20 | Discharge: 2014-09-20 | Disposition: A | Discharge status: Home / Self Care | Payer: Medicare PPO | Source: Ambulatory Visit | Attending: Internal Medicine | Admitting: Internal Medicine

## 2014-09-20 ENCOUNTER — Other Ambulatory Visit: Payer: Self-pay | Admitting: *Deleted

## 2014-09-20 ENCOUNTER — Other Ambulatory Visit: Payer: Self-pay | Admitting: Internal Medicine

## 2014-09-20 DIAGNOSIS — K869 Disease of pancreas, unspecified: Secondary | ICD-10-CM

## 2014-09-20 DIAGNOSIS — N261 Atrophy of kidney (terminal): Secondary | ICD-10-CM | POA: Diagnosis not present

## 2014-10-06 DIAGNOSIS — H35033 Hypertensive retinopathy, bilateral: Secondary | ICD-10-CM | POA: Diagnosis not present

## 2014-10-06 DIAGNOSIS — H524 Presbyopia: Secondary | ICD-10-CM | POA: Diagnosis not present

## 2014-10-06 DIAGNOSIS — H40013 Open angle with borderline findings, low risk, bilateral: Secondary | ICD-10-CM | POA: Diagnosis not present

## 2014-10-06 DIAGNOSIS — H04123 Dry eye syndrome of bilateral lacrimal glands: Secondary | ICD-10-CM | POA: Diagnosis not present

## 2014-10-10 ENCOUNTER — Other Ambulatory Visit: Payer: Self-pay | Admitting: Internal Medicine

## 2014-10-10 DIAGNOSIS — Z1231 Encounter for screening mammogram for malignant neoplasm of breast: Secondary | ICD-10-CM

## 2014-11-02 ENCOUNTER — Encounter: Payer: Self-pay | Admitting: Internal Medicine

## 2014-11-06 ENCOUNTER — Ambulatory Visit (HOSPITAL_COMMUNITY)
Admission: RE | Admit: 2014-11-06 | Discharge: 2014-11-06 | Disposition: A | Discharge status: Home / Self Care | Payer: Medicare PPO | Source: Ambulatory Visit | Attending: Internal Medicine | Admitting: Internal Medicine

## 2014-11-06 DIAGNOSIS — Z1231 Encounter for screening mammogram for malignant neoplasm of breast: Secondary | ICD-10-CM | POA: Insufficient documentation

## 2015-01-02 DIAGNOSIS — M47817 Spondylosis without myelopathy or radiculopathy, lumbosacral region: Secondary | ICD-10-CM | POA: Diagnosis not present

## 2015-01-02 DIAGNOSIS — M461 Sacroiliitis, not elsewhere classified: Secondary | ICD-10-CM | POA: Diagnosis not present

## 2015-01-08 ENCOUNTER — Ambulatory Visit
Admission: RE | Admit: 2015-01-08 | Discharge: 2015-01-08 | Disposition: A | Discharge status: Home / Self Care | Payer: Medicare PPO | Source: Ambulatory Visit | Attending: Internal Medicine | Admitting: Internal Medicine

## 2015-01-08 ENCOUNTER — Encounter: Payer: Self-pay | Admitting: Internal Medicine

## 2015-01-08 ENCOUNTER — Other Ambulatory Visit: Payer: Self-pay | Admitting: Internal Medicine

## 2015-01-08 ENCOUNTER — Ambulatory Visit (INDEPENDENT_AMBULATORY_CARE_PROVIDER_SITE_OTHER): Payer: Medicare PPO | Admitting: Internal Medicine

## 2015-01-08 VITALS — BP 128/70 | HR 96 | Temp 97.7°F | Wt 115.0 lb

## 2015-01-08 DIAGNOSIS — M25551 Pain in right hip: Secondary | ICD-10-CM

## 2015-01-08 MED ORDER — VILAZODONE HCL 40 MG PO TABS
40.0000 mg | ORAL_TABLET | Freq: Every day | ORAL | Status: DC
Start: 2015-01-08 — End: 2015-03-09

## 2015-01-08 MED ORDER — TRAMADOL HCL 50 MG PO TABS: 50.0000 mg | ORAL_TABLET | Freq: Four times a day (QID) | ORAL | Status: DC

## 2015-01-08 NOTE — Progress Notes (Addendum)
   Subjective:    Patient ID: Katherine [name], female    DOB: [birth year], 81 y.o.   MRN: [id number]  HPI  Had neurosurgery with Dr. Saintclair Halsted in February and never really became pain free. Long-standing history of lumbar spinal stenosis for which she has seen Dr. Niel Hummer in the past. Dr. Saintclair Halsted performed decompression and fusion at L4-L5 for spinal stenosis with grade 1 anteriolisthesis. She also had a new area on MRI done by Dr. Niel Hummer in December 2015 at L3 of spinal stenosis. When I saw her in January she was having considerable issues with right radiculopathy. She takes tramadol for pain as she is recovering alcoholic for many years. She is reluctant to take narcotics. She will take Flexeril. She is getting depressed over this. Feels she cannot do what she needs to do. Hurts to stand up in her kitchen. Apparently did well for about 3 weeks after surgery but by March was having considerable pain and saw Dr. Saintclair Halsted on March 22 who indicated she had worsening back pain and pain down both legs with numbness and tingling in her feet.  Subsequently she was treated with a Medrol Dosepak and had another MRI by Dr. Saintclair Halsted and he indicated that her back showed normal alignment and hardware was in proper place. In April she had SI joint injections by Dr. Brien Few. Sedimentation rate and CRP were negative. Around July 19 she had bilateral L4 median branch and L5 dorsal ramus block to the L5-S1 facets bilaterally. This did not help either. Dr. Brien Few suggested she might have an hip issue since she's been having some pain in her right groin    Review of Systems     Objective:   Physical Exam She has pain with internal rotation of her right hip. That pain seems to be in her right groin. She also has some pain in her hamstring muscles when she extends her right leg. She has normal muscle strength in the right lower extremity. There is no significant pain to right external rotation of the right hip. We did an x-ray  of her right hip which surprisingly showed a normal hip joint.       Assessment & Plan:  Pain in right groin-? Osteoarthritis of right hip versus musculoskeletal pain  History of lumbar spinal stenosis status post L4-L5 decompression and fusion by Dr. Saintclair Halsted February 2016  Recovering alcoholic for many years. Reluctant to take much pain medication  History of depression-takes Viibryd  Plan: Get orthopedic consultation regarding possible hip pain. Depending on findings, she may need to return to Dr. Saintclair Halsted for reevaluation. If this is radicular pain, she may be a candidate for Neurontin or Lyrica.

## 2015-01-09 ENCOUNTER — Telehealth: Payer: Self-pay | Admitting: Internal Medicine

## 2015-01-09 NOTE — Telephone Encounter (Signed)
Spoke with patient; advised of appointment with Dr. Zollie Beckers, Wed 8/3 @ 3:45.   [address] [phone] Info faxed to their office @ [phone] Patient instructed to take her insurance card and list of meds with her to her appointment.  Patient's insurance card doesn't indicate that she requires a referral through [facility].  She has State retirement through [place].

## 2015-01-10 NOTE — Patient Instructions (Addendum)
Viibryd has been refilled today. Appointment to be made with orthopedist regarding hip pain.

## 2015-01-17 DIAGNOSIS — M545 Low back pain: Secondary | ICD-10-CM | POA: Diagnosis not present

## 2015-01-25 ENCOUNTER — Telehealth: Payer: Self-pay | Admitting: Internal Medicine

## 2015-01-25 NOTE — Telephone Encounter (Signed)
Patient called; states that Dr. Rush Farmer advised her hip is normal.  Made Dr. Renold Genta aware.    Dr. Renold Genta spoke with Dr. Kary Kos @ [facility] [phone]); Nicoloe at his office will work on scheduling a repeat CT scan and office visit in the next week for patient.  Patient was last seen in his office April, 2016.  His concern was regarding some loose hardware.  Someone should contact the patient from Dr. Windy Carina office within the next week with an appointment.    Spoke with patient to notify her of this information.  If patient doesn't hear from Dr. Windy Carina office in a week to 10 days, patient should contact Dr. Windy Carina office and ask why?  Patient verbalizes understanding of these instructions.

## 2015-01-29 ENCOUNTER — Encounter: Payer: Self-pay | Admitting: Internal Medicine

## 2015-01-29 ENCOUNTER — Telehealth: Payer: Self-pay | Admitting: *Deleted

## 2015-01-29 ENCOUNTER — Ambulatory Visit (INDEPENDENT_AMBULATORY_CARE_PROVIDER_SITE_OTHER): Payer: Medicare PPO | Admitting: Internal Medicine

## 2015-01-29 VITALS — BP 126/74 | HR 74 | Temp 98.0°F | Wt 116.0 lb

## 2015-01-29 DIAGNOSIS — M5441 Lumbago with sciatica, right side: Secondary | ICD-10-CM

## 2015-01-29 MED ORDER — HYDROCODONE-ACETAMINOPHEN 5-325 MG PO TABS: 1.0000 | ORAL_TABLET | Freq: Four times a day (QID) | ORAL | Status: DC | PRN

## 2015-01-29 MED ORDER — PREDNISONE 10 MG PO TABS: ORAL_TABLET | ORAL | Status: DC

## 2015-01-29 NOTE — Telephone Encounter (Signed)
Advise OV?

## 2015-01-29 NOTE — Progress Notes (Addendum)
   Subjective:    Patient ID: Katherine [name], female    DOB: [birth year], 81 y.o.   MRN: [id number]  HPI Continues to have considerable back pain. Went shopping for 40 minutes and sat down to iron for close for 40 minutes and had excruciating back pain. She generally does not take narcotic pain medication because of being a recovering alcoholic for many years. Tramadol was not working. She is miserable and in a lot of pain.  Recently I had her go see Dr. Katy Fitch to make sure she was not having hip issues because she had some hip pain as well. Her hip x-ray was negative and he felt she did not have a hip problem.  I have known this lady for many years and it's not like her to complain.  Review of Systems     Objective:   Physical Exam  Straight leg raising is negative at 90 bilaterally. She is ambulating very slowly due to pain      Assessment & Plan:  Back pain status post lumbar fusion L4-L5  Plan: Appointment with Dr. Saintclair Halsted tomorrow. I started her on Sterapred DS 10 mg 6 day dosepak and gave her hydrocodone/APAP 5/325 one by mouth every 8 hours when necessary pain.

## 2015-01-29 NOTE — Patient Instructions (Signed)
Take prednisone in tapering course as directed. Take hydrocodone/APAP every 8 hours as needed for pain. Appointment with Dr. Saintclair Halsted tomorrow.

## 2015-01-29 NOTE — Telephone Encounter (Signed)
Patient states her Tramadol is not helping her pain at all. She states you told her you could give her something else for pain until she can get into see Dr Saintclair Halsted. She wants to know if she needs to come in and see you today or can we give her a script for something else? Please advise.

## 2015-01-30 DIAGNOSIS — M5126 Other intervertebral disc displacement, lumbar region: Secondary | ICD-10-CM | POA: Diagnosis not present

## 2015-01-30 DIAGNOSIS — M5137 Other intervertebral disc degeneration, lumbosacral region: Secondary | ICD-10-CM | POA: Diagnosis not present

## 2015-01-30 DIAGNOSIS — M47817 Spondylosis without myelopathy or radiculopathy, lumbosacral region: Secondary | ICD-10-CM | POA: Diagnosis not present

## 2015-02-14 ENCOUNTER — Other Ambulatory Visit: Payer: Self-pay | Admitting: Neurosurgery

## 2015-02-14 DIAGNOSIS — M5126 Other intervertebral disc displacement, lumbar region: Secondary | ICD-10-CM

## 2015-02-16 ENCOUNTER — Telehealth: Payer: Self-pay | Admitting: Internal Medicine

## 2015-02-16 MED ORDER — HYDROCODONE-ACETAMINOPHEN 5-325 MG PO TABS: 1.0000 | ORAL_TABLET | Freq: Four times a day (QID) | ORAL | Status: DC | PRN

## 2015-02-16 NOTE — Telephone Encounter (Signed)
Patient requesting refill on Hydrocodone 5-325mg .  She only has 2 pills left.    Please call when ready to pick up.  Advised that we will be closed on Monday.    Thanks.

## 2015-02-16 NOTE — Telephone Encounter (Signed)
Spoke with patient she states Dr Saintclair Halsted scheduled her for MRI for Tuesday 02/20/15 and she also has appt with him next week for review of results. Informed patient after this refill on her Hydrocodone we would not be refilling it again she will have to follow up with Dr Saintclair Halsted for pain management. Patient verbalized understanding.

## 2015-02-16 NOTE — Telephone Encounter (Signed)
Refill once. What did Dr. Saintclair Halsted say?

## 2015-02-21 ENCOUNTER — Other Ambulatory Visit: Payer: Medicare PPO

## 2015-02-21 ENCOUNTER — Ambulatory Visit
Admission: RE | Admit: 2015-02-21 | Discharge: 2015-02-21 | Disposition: A | Discharge status: Home / Self Care | Payer: Medicare PPO | Source: Ambulatory Visit | Attending: Neurosurgery | Admitting: Neurosurgery

## 2015-02-21 ENCOUNTER — Other Ambulatory Visit: Payer: Self-pay | Admitting: Neurosurgery

## 2015-02-21 DIAGNOSIS — M5126 Other intervertebral disc displacement, lumbar region: Secondary | ICD-10-CM | POA: Diagnosis not present

## 2015-02-27 ENCOUNTER — Other Ambulatory Visit: Payer: Self-pay | Admitting: Neurosurgery

## 2015-02-27 DIAGNOSIS — M5137 Other intervertebral disc degeneration, lumbosacral region: Secondary | ICD-10-CM | POA: Diagnosis not present

## 2015-02-27 DIAGNOSIS — M5126 Other intervertebral disc displacement, lumbar region: Secondary | ICD-10-CM | POA: Diagnosis not present

## 2015-03-01 DIAGNOSIS — M5137 Other intervertebral disc degeneration, lumbosacral region: Secondary | ICD-10-CM | POA: Diagnosis not present

## 2015-03-01 DIAGNOSIS — M4806 Spinal stenosis, lumbar region: Secondary | ICD-10-CM | POA: Diagnosis not present

## 2015-03-05 DIAGNOSIS — N3090 Cystitis, unspecified without hematuria: Secondary | ICD-10-CM | POA: Diagnosis not present

## 2015-03-09 ENCOUNTER — Ambulatory Visit (INDEPENDENT_AMBULATORY_CARE_PROVIDER_SITE_OTHER): Payer: Medicare PPO | Admitting: Internal Medicine

## 2015-03-09 ENCOUNTER — Encounter: Payer: Self-pay | Admitting: Internal Medicine

## 2015-03-09 VITALS — BP 122/72 | HR 100 | Temp 97.4°F | Wt 116.0 lb

## 2015-03-09 DIAGNOSIS — R350 Frequency of micturition: Secondary | ICD-10-CM | POA: Diagnosis not present

## 2015-03-09 MED ORDER — CIPROFLOXACIN HCL 250 MG PO TABS: 250.0000 mg | ORAL_TABLET | Freq: Two times a day (BID) | ORAL | Status: DC

## 2015-03-09 MED ORDER — VILAZODONE HCL 40 MG PO TABS: 40.0000 mg | ORAL_TABLET | Freq: Every day | ORAL | Status: DC

## 2015-03-09 NOTE — Patient Instructions (Signed)
Cipro 250 mg twice daily for 7 days. Take Azo-Standard over-the-counter. Another culture is pending.

## 2015-03-09 NOTE — Progress Notes (Signed)
   Subjective:    Patient ID: Katherine [name], female    DOB: 12-30-1928, 81 y.o.   MRN: [id number]  HPI  Patient has had UTI symptoms with urgency, frequency, and dysuria for 2 weeks. She went to [facility] on September 19 and saw Dr. Ronnald Ramp. Dipstick urinalysis showed trace protein and small LE. Culture was sent revealing 9000 colonies per milliliter insignificant growth at  [facility]. She was placed on Bactrim for 3 days and symptoms have not improved.  Also, she is to have  Neurosurgery by Dr. Saintclair Halsted October 5 for persistent intractable back pain. She says he feels there may be a hardware problem.  Review of Systems     Objective:   Physical Exam  No CVA tenderness. Dipstick urinalysis shows small LE. Another culture was sent.      Assessment & Plan:  Urinary tract infection  Plan: Another culture was sent and is pending. Try Cipro 250 mg twice daily for 7 days. Take Azo-Standard over-the-counter. If symptoms persist, she will need to see urologist.

## 2015-03-11 LAB — URINE CULTURE: Colony Count: 5000

## 2015-03-13 ENCOUNTER — Encounter (HOSPITAL_COMMUNITY)
Admission: RE | Admit: 2015-03-13 | Discharge: 2015-03-13 | Disposition: A | Discharge status: Home / Self Care | Payer: Medicare PPO | Source: Ambulatory Visit | Attending: Neurosurgery | Admitting: Neurosurgery

## 2015-03-13 ENCOUNTER — Encounter (HOSPITAL_COMMUNITY): Payer: Self-pay

## 2015-03-13 DIAGNOSIS — Z0183 Encounter for blood typing: Secondary | ICD-10-CM | POA: Diagnosis not present

## 2015-03-13 DIAGNOSIS — Z01812 Encounter for preprocedural laboratory examination: Secondary | ICD-10-CM | POA: Insufficient documentation

## 2015-03-13 LAB — CBC
HEMATOCRIT: 37.3 % (ref 36.0–46.0)
HEMOGLOBIN: 11.6 g/dL — AB (ref 12.0–15.0)
MCH: 27.1 pg (ref 26.0–34.0)
MCHC: 31.1 g/dL (ref 30.0–36.0)
MCV: 87.1 fL (ref 78.0–100.0)
PLATELETS: 239 10*3/uL (ref 150–400)
RBC: 4.28 MIL/uL (ref 3.87–5.11)
RDW: 14.4 % (ref 11.5–15.5)
WBC: 6.3 10*3/uL (ref 4.0–10.5)

## 2015-03-13 LAB — SURGICAL PCR SCREEN
MRSA, PCR: NEGATIVE
Staphylococcus aureus: NEGATIVE

## 2015-03-13 LAB — BASIC METABOLIC PANEL
Anion gap: 10 (ref 5–15)
BUN: 16 mg/dL (ref 6–20)
CHLORIDE: 105 mmol/L (ref 101–111)
CO2: 25 mmol/L (ref 22–32)
CREATININE: 1.09 mg/dL — AB (ref 0.44–1.00)
Calcium: 9.4 mg/dL (ref 8.9–10.3)
GFR calc non Af Amer: 45 mL/min — ABNORMAL LOW (ref >60)
GFR, EST AFRICAN AMERICAN: 52 mL/min — AB (ref >60)
Glucose, Bld: 91 mg/dL (ref 65–99)
POTASSIUM: 3.6 mmol/L (ref 3.5–5.1)
SODIUM: 140 mmol/L (ref 135–145)

## 2015-03-13 LAB — TYPE AND SCREEN
ABO/RH(D): O POS
Antibody Screen: NEGATIVE

## 2015-03-13 NOTE — Pre-Procedure Instructions (Addendum)
    Adaline Trejos Prosch  03/13/2015       Your procedure is scheduled on Wednesday, September 5.  Report to [facility] Admitting at 10:45A.M.                Your surgery is scheduled for 12:45 P.M.   Call this number if you have problems the morning of surgery:[phone]                For any other questions, please call [phone], Monday - Friday 8 AM - 4 PM.   Remember:  Do not eat food or drink liquids after midnight Tuesday.October 4.  Take these medicines the morning of surgery with A SIP OF WATER :None               Take if needed:HYDROcodone-acetaminophen (NORCO) or traMADol (ULTRAM).                May use Albuterol Inhaler if needed and bring it to the hospital with you.                  Stop taking VItamins, do not take Aspirin or Aspirin Products or Herbal Medications.             Do not wear jewelry, make-up or nail polish.   Do not wear lotions, powders, or perfumes.      Do not shave 48 hours prior to surgery.     Do not bring valuables to the hospital.   [facility] is not responsible for any belongings or valuables.  Contacts, dentures or bridgework may not be worn into surgery.  Leave your suitcase in the car.  After surgery it may be brought to your room.  For patients admitted to the hospital, discharge time will be determined by your treatment team.  Special instructions: For any other questions, please call [phone], Monday - Friday 8 AM - 4 PM.  Please read over the following fact sheets that you were given. Pain Booklet, Coughing and Deep Breathing, Blood Transfusion Information and Surgical Site Infection Prevention

## 2015-03-20 MED ORDER — CEFAZOLIN SODIUM-DEXTROSE 2-3 GM-% IV SOLR
2.0000 g | INTRAVENOUS | Status: AC
  Administered 2015-03-21: 2 g via INTRAVENOUS
  Filled 2015-03-20: qty 50

## 2015-03-20 MED ORDER — DEXAMETHASONE SODIUM PHOSPHATE 10 MG/ML IJ SOLN
10.0000 mg | INTRAMUSCULAR | Status: AC
Start: 2015-03-21 — End: 2015-03-21
  Administered 2015-03-21: 10 mg via INTRAVENOUS
  Filled 2015-03-20: qty 1

## 2015-03-21 ENCOUNTER — Inpatient Hospital Stay (HOSPITAL_COMMUNITY): Payer: Medicare PPO | Admitting: Anesthesiology

## 2015-03-21 ENCOUNTER — Inpatient Hospital Stay (HOSPITAL_COMMUNITY)
Admission: AD | Admit: 2015-03-21 | Discharge: 2015-03-24 | DRG: 460 | Disposition: A | Discharge status: Home / Self Care | Payer: Medicare PPO | Source: Ambulatory Visit | Attending: Neurosurgery | Admitting: Neurosurgery

## 2015-03-21 ENCOUNTER — Encounter (HOSPITAL_COMMUNITY)
Admission: AD | Disposition: A | Discharge status: Home / Self Care | Payer: Medicare PPO | Source: Ambulatory Visit | Attending: Neurosurgery

## 2015-03-21 ENCOUNTER — Encounter (HOSPITAL_COMMUNITY): Payer: Self-pay | Admitting: Anesthesiology

## 2015-03-21 ENCOUNTER — Inpatient Hospital Stay (HOSPITAL_COMMUNITY): Payer: Medicare PPO

## 2015-03-21 DIAGNOSIS — M549 Dorsalgia, unspecified: Secondary | ICD-10-CM | POA: Diagnosis present

## 2015-03-21 DIAGNOSIS — M5116 Intervertebral disc disorders with radiculopathy, lumbar region: Secondary | ICD-10-CM | POA: Diagnosis not present

## 2015-03-21 DIAGNOSIS — R7611 Nonspecific reaction to tuberculin skin test without active tuberculosis: Secondary | ICD-10-CM | POA: Diagnosis present

## 2015-03-21 DIAGNOSIS — Z87891 Personal history of nicotine dependence: Secondary | ICD-10-CM | POA: Diagnosis not present

## 2015-03-21 DIAGNOSIS — E785 Hyperlipidemia, unspecified: Secondary | ICD-10-CM | POA: Diagnosis not present

## 2015-03-21 DIAGNOSIS — Z8711 Personal history of peptic ulcer disease: Secondary | ICD-10-CM | POA: Diagnosis not present

## 2015-03-21 DIAGNOSIS — Z79899 Other long term (current) drug therapy: Secondary | ICD-10-CM | POA: Diagnosis not present

## 2015-03-21 DIAGNOSIS — M4326 Fusion of spine, lumbar region: Secondary | ICD-10-CM | POA: Diagnosis not present

## 2015-03-21 DIAGNOSIS — Z886 Allergy status to analgesic agent status: Secondary | ICD-10-CM | POA: Diagnosis not present

## 2015-03-21 DIAGNOSIS — Z981 Arthrodesis status: Secondary | ICD-10-CM

## 2015-03-21 DIAGNOSIS — Z9109 Other allergy status, other than to drugs and biological substances: Secondary | ICD-10-CM | POA: Diagnosis not present

## 2015-03-21 DIAGNOSIS — M1990 Unspecified osteoarthritis, unspecified site: Secondary | ICD-10-CM | POA: Diagnosis not present

## 2015-03-21 DIAGNOSIS — J449 Chronic obstructive pulmonary disease, unspecified: Secondary | ICD-10-CM | POA: Diagnosis not present

## 2015-03-21 DIAGNOSIS — Z7982 Long term (current) use of aspirin: Secondary | ICD-10-CM | POA: Diagnosis not present

## 2015-03-21 DIAGNOSIS — Z8041 Family history of malignant neoplasm of ovary: Secondary | ICD-10-CM | POA: Diagnosis not present

## 2015-03-21 DIAGNOSIS — K589 Irritable bowel syndrome without diarrhea: Secondary | ICD-10-CM | POA: Diagnosis present

## 2015-03-21 DIAGNOSIS — Z419 Encounter for procedure for purposes other than remedying health state, unspecified: Secondary | ICD-10-CM

## 2015-03-21 DIAGNOSIS — M4806 Spinal stenosis, lumbar region: Secondary | ICD-10-CM | POA: Diagnosis not present

## 2015-03-21 DIAGNOSIS — M5136 Other intervertebral disc degeneration, lumbar region: Secondary | ICD-10-CM | POA: Diagnosis not present

## 2015-03-21 DIAGNOSIS — M5126 Other intervertebral disc displacement, lumbar region: Secondary | ICD-10-CM | POA: Diagnosis not present

## 2015-03-21 DIAGNOSIS — M4802 Spinal stenosis, cervical region: Secondary | ICD-10-CM | POA: Diagnosis not present

## 2015-03-21 DIAGNOSIS — M48061 Spinal stenosis, lumbar region without neurogenic claudication: Secondary | ICD-10-CM | POA: Diagnosis present

## 2015-03-21 DIAGNOSIS — E538 Deficiency of other specified B group vitamins: Secondary | ICD-10-CM | POA: Diagnosis not present

## 2015-03-21 SURGERY — POSTERIOR LUMBAR FUSION 2 WITH HARDWARE REMOVAL
Anesthesia: General | Site: Back

## 2015-03-21 MED ORDER — 0.9 % SODIUM CHLORIDE (POUR BTL) OPTIME
TOPICAL | Status: DC | PRN
  Administered 2015-03-21: 1000 mL

## 2015-03-21 MED ORDER — HYDROMORPHONE HCL 1 MG/ML IJ SOLN
0.5000 mg | INTRAMUSCULAR | Status: DC | PRN
  Administered 2015-03-21: 1 mg via INTRAVENOUS
  Filled 2015-03-21: qty 1

## 2015-03-21 MED ORDER — ALUM & MAG HYDROXIDE-SIMETH 200-200-20 MG/5ML PO SUSP: 30.0000 mL | Freq: Four times a day (QID) | ORAL | Status: DC | PRN

## 2015-03-21 MED ORDER — B-COMPLEX/B-12 TR PO TBCR: EXTENDED_RELEASE_TABLET | Freq: Every day | ORAL | Status: DC

## 2015-03-21 MED ORDER — GLYCOPYRROLATE 0.2 MG/ML IJ SOLN
INTRAMUSCULAR | Status: DC | PRN
  Administered 2015-03-21: 0.6 mg via INTRAVENOUS

## 2015-03-21 MED ORDER — BUPIVACAINE LIPOSOME 1.3 % IJ SUSP
20.0000 mL | INTRAMUSCULAR | Status: AC
  Filled 2015-03-21: qty 20

## 2015-03-21 MED ORDER — VANCOMYCIN HCL 1000 MG IV SOLR
INTRAVENOUS | Status: AC
  Filled 2015-03-21: qty 1000

## 2015-03-21 MED ORDER — OXYCODONE-ACETAMINOPHEN 5-325 MG PO TABS
1.0000 | ORAL_TABLET | ORAL | Status: DC | PRN
  Administered 2015-03-22 (×3): 2 via ORAL
  Administered 2015-03-23 – 2015-03-24 (×3): 1 via ORAL
  Filled 2015-03-21: qty 1
  Filled 2015-03-21: qty 2
  Filled 2015-03-21 (×2): qty 1
  Filled 2015-03-21 (×2): qty 2

## 2015-03-21 MED ORDER — VILAZODONE HCL 40 MG PO TABS
40.0000 mg | ORAL_TABLET | Freq: Every day | ORAL | Status: DC
  Administered 2015-03-21 – 2015-03-24 (×4): 40 mg via ORAL
  Filled 2015-03-21 (×4): qty 1

## 2015-03-21 MED ORDER — MENTHOL 3 MG MT LOZG
1.0000 | LOZENGE | OROMUCOSAL | Status: DC | PRN
  Administered 2015-03-23: 3 mg via ORAL
  Filled 2015-03-21: qty 9

## 2015-03-21 MED ORDER — ATORVASTATIN CALCIUM 10 MG PO TABS
10.0000 mg | ORAL_TABLET | Freq: Every day | ORAL | Status: DC
  Administered 2015-03-21 – 2015-03-24 (×4): 10 mg via ORAL
  Filled 2015-03-21 (×4): qty 1

## 2015-03-21 MED ORDER — TRAMADOL HCL 50 MG PO TABS: 50.0000 mg | ORAL_TABLET | Freq: Four times a day (QID) | ORAL | Status: DC | PRN

## 2015-03-21 MED ORDER — ROCURONIUM BROMIDE 100 MG/10ML IV SOLN
INTRAVENOUS | Status: DC | PRN
  Administered 2015-03-21: 50 mg via INTRAVENOUS

## 2015-03-21 MED ORDER — FENTANYL CITRATE (PF) 250 MCG/5ML IJ SOLN
INTRAMUSCULAR | Status: AC
  Filled 2015-03-21: qty 5

## 2015-03-21 MED ORDER — FENTANYL CITRATE (PF) 100 MCG/2ML IJ SOLN
INTRAMUSCULAR | Status: AC
  Filled 2015-03-21: qty 2

## 2015-03-21 MED ORDER — ONDANSETRON HCL 4 MG/2ML IJ SOLN: 4.0000 mg | Freq: Once | INTRAMUSCULAR | Status: DC | PRN

## 2015-03-21 MED ORDER — ACETAMINOPHEN 325 MG PO TABS: 650.0000 mg | ORAL_TABLET | ORAL | Status: DC | PRN

## 2015-03-21 MED ORDER — NEOSTIGMINE METHYLSULFATE 10 MG/10ML IV SOLN
INTRAVENOUS | Status: DC | PRN
  Administered 2015-03-21: 3 mg via INTRAVENOUS

## 2015-03-21 MED ORDER — B COMPLEX-C PO TABS
1.0000 | ORAL_TABLET | Freq: Every day | ORAL | Status: DC
  Administered 2015-03-21 – 2015-03-24 (×4): 1 via ORAL
  Filled 2015-03-21 (×4): qty 1

## 2015-03-21 MED ORDER — ONDANSETRON HCL 4 MG/2ML IJ SOLN
INTRAMUSCULAR | Status: DC | PRN
  Administered 2015-03-21: 4 mg via INTRAVENOUS

## 2015-03-21 MED ORDER — LIDOCAINE HCL (CARDIAC) 20 MG/ML IV SOLN
INTRAVENOUS | Status: DC | PRN
  Administered 2015-03-21: 50 mg via INTRAVENOUS

## 2015-03-21 MED ORDER — HYDROCODONE-ACETAMINOPHEN 5-325 MG PO TABS
1.0000 | ORAL_TABLET | Freq: Four times a day (QID) | ORAL | Status: DC | PRN
  Administered 2015-03-22: 1 via ORAL
  Filled 2015-03-21 (×2): qty 1

## 2015-03-21 MED ORDER — ALBUMIN HUMAN 5 % IV SOLN
INTRAVENOUS | Status: DC | PRN
  Administered 2015-03-21: 15:00:00 via INTRAVENOUS

## 2015-03-21 MED ORDER — HYPROMELLOSE (GONIOSCOPIC) 2.5 % OP SOLN: 2.0000 [drp] | OPHTHALMIC | Status: DC | PRN

## 2015-03-21 MED ORDER — VITAMIN D 1000 UNITS PO TABS
1000.0000 [IU] | ORAL_TABLET | Freq: Every day | ORAL | Status: DC
  Administered 2015-03-21 – 2015-03-24 (×4): 1000 [IU] via ORAL
  Filled 2015-03-21 (×4): qty 1

## 2015-03-21 MED ORDER — FENTANYL CITRATE (PF) 100 MCG/2ML IJ SOLN
25.0000 ug | INTRAMUSCULAR | Status: DC | PRN
  Administered 2015-03-21 (×2): 50 ug via INTRAVENOUS

## 2015-03-21 MED ORDER — ONDANSETRON HCL 4 MG/2ML IJ SOLN: 4.0000 mg | INTRAMUSCULAR | Status: DC | PRN

## 2015-03-21 MED ORDER — PHENYLEPHRINE HCL 10 MG/ML IJ SOLN
INTRAMUSCULAR | Status: DC | PRN
  Administered 2015-03-21 (×3): 80 ug via INTRAVENOUS

## 2015-03-21 MED ORDER — SODIUM CHLORIDE 0.9 % IV SOLN
250.0000 mL | INTRAVENOUS | Status: DC
  Administered 2015-03-21: 250 mL via INTRAVENOUS

## 2015-03-21 MED ORDER — FERROUS SULFATE 325 (65 FE) MG PO TABS
325.0000 mg | ORAL_TABLET | Freq: Every day | ORAL | Status: DC
  Administered 2015-03-22 – 2015-03-24 (×3): 325 mg via ORAL
  Filled 2015-03-21 (×3): qty 1

## 2015-03-21 MED ORDER — OXYCODONE HCL 5 MG/5ML PO SOLN: 5.0000 mg | Freq: Once | ORAL | Status: DC | PRN

## 2015-03-21 MED ORDER — EPHEDRINE SULFATE 50 MG/ML IJ SOLN
INTRAMUSCULAR | Status: DC | PRN
  Administered 2015-03-21: 10 mg via INTRAVENOUS

## 2015-03-21 MED ORDER — BACITRACIN 50000 UNITS IM SOLR
INTRAMUSCULAR | Status: DC | PRN
  Administered 2015-03-21: 14:00:00

## 2015-03-21 MED ORDER — FENTANYL CITRATE (PF) 100 MCG/2ML IJ SOLN
INTRAMUSCULAR | Status: DC | PRN
  Administered 2015-03-21: 50 ug via INTRAVENOUS

## 2015-03-21 MED ORDER — CYCLOBENZAPRINE HCL 10 MG PO TABS
10.0000 mg | ORAL_TABLET | Freq: Three times a day (TID) | ORAL | Status: DC | PRN
  Administered 2015-03-23: 10 mg via ORAL
  Filled 2015-03-21 (×2): qty 1

## 2015-03-21 MED ORDER — FAMOTIDINE 20 MG PO TABS: 40.0000 mg | ORAL_TABLET | Freq: Every evening | ORAL | Status: DC | PRN

## 2015-03-21 MED ORDER — OXYCODONE HCL 5 MG PO TABS: 5.0000 mg | ORAL_TABLET | Freq: Once | ORAL | Status: DC | PRN

## 2015-03-21 MED ORDER — THROMBIN 20000 UNITS EX SOLR
CUTANEOUS | Status: DC | PRN
  Administered 2015-03-21: 14:00:00 via TOPICAL

## 2015-03-21 MED ORDER — ASPIRIN EC 81 MG PO TBEC
81.0000 mg | DELAYED_RELEASE_TABLET | Freq: Every day | ORAL | Status: DC
  Administered 2015-03-22 – 2015-03-24 (×3): 81 mg via ORAL
  Filled 2015-03-21 (×4): qty 1

## 2015-03-21 MED ORDER — LIDOCAINE-EPINEPHRINE 1 %-1:100000 IJ SOLN
INTRAMUSCULAR | Status: DC | PRN
  Administered 2015-03-21: 9 mL

## 2015-03-21 MED ORDER — ACETAMINOPHEN 650 MG RE SUPP: 650.0000 mg | RECTAL | Status: DC | PRN

## 2015-03-21 MED ORDER — POLYETHYL GLYCOL-PROPYL GLYCOL 0.4-0.3 % OP GEL: Freq: Once | OPHTHALMIC | Status: DC

## 2015-03-21 MED ORDER — ASPIRIN 81 MG PO TABS: 81.0000 mg | ORAL_TABLET | Freq: Every day | ORAL | Status: DC

## 2015-03-21 MED ORDER — CEFAZOLIN SODIUM 1-5 GM-% IV SOLN
1.0000 g | Freq: Three times a day (TID) | INTRAVENOUS | Status: AC
  Administered 2015-03-21 – 2015-03-23 (×5): 1 g via INTRAVENOUS
  Filled 2015-03-21 (×6): qty 50

## 2015-03-21 MED ORDER — PROPOFOL 10 MG/ML IV BOLUS
INTRAVENOUS | Status: AC
  Filled 2015-03-21: qty 20

## 2015-03-21 MED ORDER — LACTATED RINGERS IV SOLN
INTRAVENOUS | Status: DC | PRN
  Administered 2015-03-21 (×2): via INTRAVENOUS

## 2015-03-21 MED ORDER — IRON 18 MG PO TBCR: EXTENDED_RELEASE_TABLET | Freq: Every day | ORAL | Status: DC

## 2015-03-21 MED ORDER — DOCUSATE SODIUM 100 MG PO CAPS
100.0000 mg | ORAL_CAPSULE | Freq: Two times a day (BID) | ORAL | Status: DC
  Administered 2015-03-21 – 2015-03-24 (×6): 100 mg via ORAL
  Filled 2015-03-21 (×6): qty 1

## 2015-03-21 MED ORDER — PHENOL 1.4 % MT LIQD
1.0000 | OROMUCOSAL | Status: DC | PRN
Start: 2015-03-21 — End: 2015-03-24

## 2015-03-21 MED ORDER — SODIUM CHLORIDE 0.9 % IJ SOLN
3.0000 mL | Freq: Two times a day (BID) | INTRAMUSCULAR | Status: DC
  Administered 2015-03-21 – 2015-03-24 (×5): 3 mL via INTRAVENOUS

## 2015-03-21 MED ORDER — SODIUM CHLORIDE 0.9 % IJ SOLN: 3.0000 mL | INTRAMUSCULAR | Status: DC | PRN

## 2015-03-21 MED ORDER — PROPOFOL 10 MG/ML IV BOLUS
INTRAVENOUS | Status: DC | PRN
  Administered 2015-03-21: 100 mg via INTRAVENOUS

## 2015-03-21 MED ORDER — ALBUTEROL SULFATE (2.5 MG/3ML) 0.083% IN NEBU: 2.5000 mg | INHALATION_SOLUTION | Freq: Four times a day (QID) | RESPIRATORY_TRACT | Status: DC | PRN

## 2015-03-21 MED ORDER — LACTATED RINGERS IV SOLN
INTRAVENOUS | Status: DC
  Administered 2015-03-21: 12:00:00 via INTRAVENOUS

## 2015-03-21 SURGICAL SUPPLY — 80 items
ALLOSTEM STRIP 20MMX50MM (Tissue) ×2 IMPLANT
APL SKNCLS STERI-STRIP NONHPOA (GAUZE/BANDAGES/DRESSINGS) ×1
BAG DECANTER FOR FLEXI CONT (MISCELLANEOUS) ×3 IMPLANT
BENZOIN TINCTURE PRP APPL 2/3 (GAUZE/BANDAGES/DRESSINGS) ×3 IMPLANT
BLADE CLIPPER SURG (BLADE) IMPLANT
BLADE SURG 11 STRL SS (BLADE) ×3 IMPLANT
BONE ALLOSTEM MORSELIZED 5CC (Bone Implant) ×4 IMPLANT
BRUSH SCRUB EZ PLAIN DRY (MISCELLANEOUS) ×3 IMPLANT
BUR MATCHSTICK NEURO 3.0 LAGG (BURR) ×3 IMPLANT
BUR PRECISION FLUTE 6.0 (BURR) ×3 IMPLANT
CANISTER SUCT 3000ML PPV (MISCELLANEOUS) ×3 IMPLANT
CAP LOCKING (Cap) ×16 IMPLANT
CAP LOCKING 5.5 CREO (Cap) IMPLANT
CLOSURE WOUND 1/2 X4 (GAUZE/BANDAGES/DRESSINGS) ×1
CONT SPEC 4OZ CLIKSEAL STRL BL (MISCELLANEOUS) ×5 IMPLANT
COVER BACK TABLE 24X17X13 BIG (DRAPES) IMPLANT
COVER BACK TABLE 60X90IN (DRAPES) ×3 IMPLANT
DECANTER SPIKE VIAL GLASS SM (MISCELLANEOUS) ×3 IMPLANT
DRAPE C-ARM 42X72 X-RAY (DRAPES) ×3 IMPLANT
DRAPE C-ARMOR (DRAPES) ×3 IMPLANT
DRAPE LAPAROTOMY 100X72X124 (DRAPES) ×5 IMPLANT
DRAPE POUCH INSTRU U-SHP 10X18 (DRAPES) ×3 IMPLANT
DRAPE PROXIMA HALF (DRAPES) IMPLANT
DRAPE SURG 17X23 STRL (DRAPES) ×3 IMPLANT
DRSG OPSITE 4X5.5 SM (GAUZE/BANDAGES/DRESSINGS) ×2 IMPLANT
DRSG OPSITE POSTOP 4X8 (GAUZE/BANDAGES/DRESSINGS) ×2 IMPLANT
DURAPREP 26ML APPLICATOR (WOUND CARE) ×3 IMPLANT
ELECT REM PT RETURN 9FT ADLT (ELECTROSURGICAL) ×3
ELECTRODE REM PT RTRN 9FT ADLT (ELECTROSURGICAL) ×1 IMPLANT
EVACUATOR 3/16  PVC DRAIN (DRAIN)
EVACUATOR 3/16 PVC DRAIN (DRAIN) ×1 IMPLANT
GAUZE SPONGE 4X4 12PLY STRL (GAUZE/BANDAGES/DRESSINGS) ×3 IMPLANT
GAUZE SPONGE 4X4 16PLY XRAY LF (GAUZE/BANDAGES/DRESSINGS) IMPLANT
GLOVE BIO SURGEON STRL SZ8 (GLOVE) ×6 IMPLANT
GLOVE ECLIPSE 7.0 STRL STRAW (GLOVE) ×6 IMPLANT
GLOVE ECLIPSE 7.5 STRL STRAW (GLOVE) ×8 IMPLANT
GLOVE EXAM NITRILE LRG STRL (GLOVE) IMPLANT
GLOVE EXAM NITRILE MD LF STRL (GLOVE) IMPLANT
GLOVE EXAM NITRILE XL STR (GLOVE) IMPLANT
GLOVE EXAM NITRILE XS STR PU (GLOVE) IMPLANT
GLOVE INDICATOR 6.5 STRL GRN (GLOVE) ×4 IMPLANT
GLOVE INDICATOR 7.5 STRL GRN (GLOVE) ×4 IMPLANT
GLOVE INDICATOR 8.0 STRL GRN (GLOVE) ×4 IMPLANT
GLOVE INDICATOR 8.5 STRL (GLOVE) ×6 IMPLANT
GOWN STRL REUS W/ TWL LRG LVL3 (GOWN DISPOSABLE) IMPLANT
GOWN STRL REUS W/ TWL XL LVL3 (GOWN DISPOSABLE) ×2 IMPLANT
GOWN STRL REUS W/TWL 2XL LVL3 (GOWN DISPOSABLE) ×2 IMPLANT
GOWN STRL REUS W/TWL LRG LVL3 (GOWN DISPOSABLE)
GOWN STRL REUS W/TWL XL LVL3 (GOWN DISPOSABLE) ×6
IMPL SPINE MCS 6.5 5.5X35 (Neuro Prosthesis/Implant) IMPLANT
IMPLANT SPINE MCS 6.5 5.5X35 (Neuro Prosthesis/Implant) ×6 IMPLANT
KIT BASIN OR (CUSTOM PROCEDURE TRAY) ×3 IMPLANT
KIT INFUSE X SMALL 1.4CC (Orthopedic Implant) ×2 IMPLANT
KIT ROOM TURNOVER OR (KITS) ×3 IMPLANT
LIQUID BAND (GAUZE/BANDAGES/DRESSINGS) ×3 IMPLANT
MILL MEDIUM DISP (BLADE) ×2 IMPLANT
MIX DBX 10CC 35% BONE (Bone Implant) ×2 IMPLANT
NDL HYPO 21X1.5 SAFETY (NEEDLE) ×1 IMPLANT
NDL HYPO 25X1 1.5 SAFETY (NEEDLE) ×1 IMPLANT
NEEDLE HYPO 21X1.5 SAFETY (NEEDLE) ×3 IMPLANT
NEEDLE HYPO 25X1 1.5 SAFETY (NEEDLE) ×3 IMPLANT
NS IRRIG 1000ML POUR BTL (IV SOLUTION) ×3 IMPLANT
PACK LAMINECTOMY NEURO (CUSTOM PROCEDURE TRAY) ×3 IMPLANT
PAD ARMBOARD 7.5X6 YLW CONV (MISCELLANEOUS) ×9 IMPLANT
ROD 75MM SPINAL (Rod) ×4 IMPLANT
SCREW MOD 6.0-5.0X35MM (Screw) ×4 IMPLANT
SPACER ALTERA 10X26 9-13MM (Spacer) ×2 IMPLANT
SPONGE LAP 4X18 X RAY DECT (DISPOSABLE) IMPLANT
SPONGE SURGIFOAM ABS GEL 100 (HEMOSTASIS) ×3 IMPLANT
STRIP CLOSURE SKIN 1/2X4 (GAUZE/BANDAGES/DRESSINGS) ×3 IMPLANT
SUT VIC AB 0 CT1 18XCR BRD8 (SUTURE) ×1 IMPLANT
SUT VIC AB 0 CT1 8-18 (SUTURE) ×6
SUT VIC AB 2-0 CT1 18 (SUTURE) ×3 IMPLANT
SUT VICRYL 4-0 PS2 18IN ABS (SUTURE) ×3 IMPLANT
SYR 20CC LL (SYRINGE) ×3 IMPLANT
TOWEL OR 17X24 6PK STRL BLUE (TOWEL DISPOSABLE) ×3 IMPLANT
TOWEL OR 17X26 10 PK STRL BLUE (TOWEL DISPOSABLE) ×3 IMPLANT
TRAY FOLEY W/METER SILVER 14FR (SET/KITS/TRAYS/PACK) ×3 IMPLANT
TULIP CREP AMP 5.5MM (Orthopedic Implant) ×8 IMPLANT
WATER STERILE IRR 1000ML POUR (IV SOLUTION) ×3 IMPLANT

## 2015-03-21 NOTE — Anesthesia Postprocedure Evaluation (Signed)
  Anesthesia Post-op Note  Patient: Katherine [name]  Procedure(s) Performed: Procedure(s): Transforaminal Lumbar Interbody Fusion  - Lumbar three-four, Posterior Lumbar Fusion Lumbar three-Sacral one, instrumentation Lumbar three-Sacral one, Exploration of Fusion and removal of hardware Lumbar four-five  (N/A)  Patient Location: PACU  Anesthesia Type: General   Level of Consciousness: awake, alert  and oriented  Airway and Oxygen Therapy: Patient Spontanous Breathing  Post-op Pain: mild  Post-op Assessment: Post-op Vital signs reviewed  Post-op Vital Signs: Reviewed  Last Vitals:  Filed Vitals:   03/21/15 1700  BP:   Pulse:   Temp: 36.7 C  Resp:     Complications: No apparent anesthesia complications

## 2015-03-21 NOTE — Transfer of Care (Signed)
Immediate Anesthesia Transfer of Care Note  Patient: Katherine [name]  Procedure(s) Performed: Procedure(s): Transforaminal Lumbar Interbody Fusion  - Lumbar three-four, Posterior Lumbar Fusion Lumbar three-Sacral one, instrumentation Lumbar three-Sacral one, Exploration of Fusion and removal of hardware Lumbar four-five  (N/A)  Patient Location: PACU  Anesthesia Type:General  Level of Consciousness: awake and alert   Airway & Oxygen Therapy: Patient Spontanous Breathing and Patient connected to nasal cannula oxygen  Post-op Assessment: Report given to RN and Post -op Vital signs reviewed and stable  Post vital signs: Reviewed and stable  Last Vitals:  Filed Vitals:   03/21/15 1700  BP:   Pulse:   Temp: 36.7 C  Resp:     Complications: No apparent anesthesia complications

## 2015-03-21 NOTE — H&P (Signed)
Katherine [name] is an 81 y.o. female.   Chief Complaint: Back pain HPI: 81 year old female is several months out from a decompressive laminectomy and fusion L4-5 who initially was doing fairly well but then start him progressive worsening back pain without radiation. This been refractory to conservative treatment, it was narcotic pain medication and injections. Workup with MRI CT scan and plain films of shown progressive instability and breakdown L3-4 as well as severe degenerative disc disease L5-S1. So due to her progression of pain following surgery at L4-5 and recommended a reexploration of her fusion with an interbody fusion at L3-4 and L5-S1. CT scan also showed possibility of loosening of her left L5 screw so we will remove this and either replace it and leave it out. I've extensively gone over the risks and benefits of the operation with her as well as perioperative course expectations of outcome and alternatives surgery and she understands and agrees to proceed forward.  Past Medical History  Diagnosis Date  . Hyperlipidemia   . PUD (peptic ulcer disease)   . B12 deficiency   . IBS (irritable bowel syndrome)   . Positive PPD   . Allergy   . Anemia   . Hx of adenomatous colonic polyps 2006  . Hepatic cyst 01/30/10  . Diverticulosis   . Esophageal stricture   . COPD (chronic obstructive pulmonary disease) ([place])   . Shortness of breath dyspnea     SOB WITH EXERTION   (NOT NEW)  . Depression   . Headache   . Arthritis   . PONV (postoperative nausea and vomiting)     no nausea or vomitting after surgery 07/2014  . Asthma     patient denies  . Hiatal hernia 1985    patient unaware    Past Surgical History  Procedure Laterality Date  . Appendectomy    . Abdominal hysterectomy    . Vagotomy and pyloroplasty      in [place], [place]  . Breast biopsy Bilateral     Milk glnd  . Bartholin gland cyst excision      x2  . Cataract extraction w/ intraocular lens  implant, bilateral  Bilateral   . Upper gi endoscopy    . Colonoscopy w/ polypectomy    . Colonoscopy      Family History  Problem Relation Age of Onset  . Ulcers Father   . Stroke Father   . Irritable bowel syndrome Father   . Colon cancer Neg Hx   . Ovarian cancer Sister    Social History:  reports that she quit smoking about 5 years ago. She has never used smokeless tobacco. She reports that she does not drink alcohol or use illicit drugs.  Allergies:  Allergies  Allergen Reactions  . Aspirin     REACTION: Hx of ulcers  . Ibuprofen Nausea Only    Medications Prior to Admission  Medication Sig Dispense Refill  . albuterol (PROVENTIL) (2.5 MG/3ML) 0.083% nebulizer solution Take 2.5 mg by nebulization every 6 (six) hours as needed.      Marland Kitchen aspirin 81 MG tablet Take 81 mg by mouth daily.    Marland Kitchen atorvastatin (LIPITOR) 10 MG tablet Take 1 tablet (10 mg total) by mouth daily. 90 tablet 3  . B Complex Vitamins (B-COMPLEX/B-12 TR PO) Take 1 tablet by mouth daily.    . cholecalciferol (VITAMIN D) 1000 UNITS tablet Take 1,000 Units by mouth daily.    . ciprofloxacin (CIPRO) 250 MG tablet Take 1 tablet (250  mg total) by mouth 2 (two) times daily. 14 tablet 0  . HYDROcodone-acetaminophen (NORCO) 5-325 MG per tablet Take 1 tablet by mouth every 6 (six) hours as needed for moderate pain. 40 tablet 0  . IRON PO Take 1 tablet by mouth daily.    [name] (SYSTANE OP) Apply 1 drop to eye as needed (dry eyes).    . traZODone (DESYREL) 50 MG tablet Take 2 tablets (100 mg total) by mouth at bedtime. (Patient taking differently: Take 50-100 mg by mouth at bedtime. ) 180 tablet 3  . Vilazodone HCl (VIIBRYD) 40 MG TABS Take 1 tablet (40 mg total) by mouth daily. 30 tablet 2  . VITAMIN E PO Take 1 tablet by mouth daily.    . cyclobenzaprine (FLEXERIL) 10 MG tablet Take 1 tablet (10 mg total) by mouth 3 (three) times daily as needed for muscle spasms. 60 tablet 0  . famotidine (PEPCID) 40 MG tablet  Take 1 tablet (40 mg total) by mouth at bedtime as needed for heartburn. 30 tablet 3  . traMADol (ULTRAM) 50 MG tablet Take 1 tablet (50 mg total) by mouth every 6 (six) hours. 80 tablet 1    No results found for this or any previous visit (from the past 48 hour(s)). No results found.  Review of Systems  Constitutional: Negative.   HENT: Negative.   Eyes: Negative.   Respiratory: Negative.   Cardiovascular: Negative.   Gastrointestinal: Negative.   Genitourinary: Negative.   Musculoskeletal: Positive for myalgias and back pain.  Skin: Negative.   Neurological: Negative.   Psychiatric/Behavioral: Negative.     Blood pressure 142/69, pulse 80, temperature 97 F (36.1 C), temperature source Oral, resp. rate 18, height 5\' 2"  (1.575 m), weight 52.192 kg (115 lb 1 oz), SpO2 100 %. Physical Exam  Constitutional: She is oriented to person, place, and time. She appears well-developed and well-nourished.  Eyes: Pupils are equal, round, and reactive to light.  Neck: Normal range of motion.  Respiratory: Effort normal.  GI: Soft.  Neurological: She is alert and oriented to person, place, and time. She has normal strength. GCS eye subscore is 4. GCS verbal subscore is 5. GCS motor subscore is 6.  Strength is 5 out of 5 in her iliopsoas, quads, and she's, gastrocs, and tibialis, EHL.  Skin: Skin is warm and dry.     Assessment/Plan A 81 year female presents for interbody fusions at L3-4 and L5-S1.  Katherine [name] P 03/21/2015, 12:44 PM

## 2015-03-21 NOTE — Anesthesia Preprocedure Evaluation (Addendum)
Anesthesia Evaluation  Patient identified by MRN, date of birth, ID band Patient awake    Reviewed: Allergy & Precautions, NPO status , Patient's Chart, lab work & pertinent test results  History of Anesthesia Complications (+) PONV and history of anesthetic complications  Airway Mallampati: II  TM Distance: >3 FB Neck ROM: Full    Dental   Pulmonary shortness of breath, asthma , COPD, former smoker,    breath sounds clear to auscultation       Cardiovascular negative cardio ROS   Rhythm:Regular     Neuro/Psych  Headaches, Depression    GI/Hepatic Neg liver ROS, hiatal hernia, PUD, GERD  ,  Endo/Other    Renal/GU negative Renal ROS     Musculoskeletal  (+) Arthritis ,   Abdominal   Peds  Hematology  (+) anemia ,   Anesthesia Other Findings Will consider 1 time dose of ketamine (0.5mg /kg) with induction to help with pain control, GA with oral ETT  Reproductive/Obstetrics                           Anesthesia Physical  Anesthesia Plan  ASA: III  Anesthesia Plan: General   Post-op Pain Management:    Induction: Intravenous  Airway Management Planned: Oral ETT  Additional Equipment:   Intra-op Plan:   Post-operative Plan: Possible Post-op intubation/ventilation  Informed Consent: I have reviewed the patients History and Physical, chart, labs and discussed the procedure including the risks, benefits and alternatives for the proposed anesthesia with the patient or authorized representative who has indicated his/her understanding and acceptance.   Dental advisory given  Plan Discussed with: CRNA, Anesthesiologist and Surgeon  Anesthesia Plan Comments:         Anesthesia Quick Evaluation

## 2015-03-21 NOTE — Progress Notes (Signed)
Patient arrived to 5C18 from PACU at [place]. Patient alert and oriented X4 . Vital signs taken and charted. Oriented to room with bed alarm on . Family at bedside.

## 2015-03-21 NOTE — Op Note (Signed)
Preoperative diagnosis: Lumbar spinal stenosis and degenerative disc disease lumbar instability L3-4, possible pseudoarthrosis L4-5, degenerative disease L5-S1.  Postoperative diagnosis: Lumbar spinal stenosis degenerative disc disease lumbar instability L3-4 degenerative disc disease L5-S1.  Procedure #1 decompressive lumbar laminectomy L3-4 and transforaminal lumbar interbody fusion L3-4 from the right using the globus Alterra expandable cage packed with locally harvested autograft mixed with  alostem morsels and BMP.  #2  exploration of fusion removal of hardware L4-5  #3 cortical screw fixation L3-S1 with placement of new screws at L3 bilaterally and S1 bilaterally  #4 posterior lateral arthrodesis L3-S1 using the locally harvested autograft mixed with BMP and allostem morsels and strips  Surgeon: Katherine [name]  Asst.: Marland Kitchen daily  Anesthesia: Gen.  EBL: Minimal  History of present illness: Patient is a very pleasant 81 year old female who had undergone L4-5 fusion but started developing progressive worsening back pain workup revealed progressive stenosis instability at L3-4 as well as severe joint disease and vacuum disc phenomena at L5-S1. There also was a question of loosening of her left L5 screw possible pseudoarthrosis at L4-5 with subsidence of her cages. Due to patient's progression of clinical syndrome imaging findings and failure conservative treatment I recommended decompression fusion L3 -4 fusion L5-S1 and exploration of fusion possible removal of hardware L4-5. I extensively went over the risks and benefits of the operation with the patient as well as perioperative course expectations of outcome and alternatives of surgery she understood and agreed to proceed forward.   operative procedure: Patient brought in the or was induced on general anesthesia positioned prone on the Wilson frame her back was prepped and draped in routine sterile fashion her old incision was opened up and  extended cephalad caudally subperiosteal dissections care lamina of L3 exposing the facet at L2-3 and then exposed the hardware at L4-5 exposing the L5-S1 facet complex and lamina. The fusion L4-5 was inspected it was felt actually be solid I disconnected the nuts remove the rods all the screws felt to have good purchase and the L4-5 segment moved as a unit with manipulation. So I left all screws in place. At this point using AP fluoroscopy I placed bilateral L3 cortical screws with pilot holes any of 5 and 7:00 position bilaterally all screws excellent purchase. Then I began the decompression and Allen decompress the right side remove the spinous process decompress part of the central canal and a March [place] laterally did a complete facetectomy radical foraminotomies the L4 and L5 nerve root there was marked stenosis the undersurface of the 4 root and the facet was diastased and incised unstable. I gained access lateral margins of the disc space. Then a coag the epidural veins clean the disc space out with paddle shavers pituitaries Epstein's and Scoville holes and then selected a 9 expandable 26 Mildred cage and inserted this with attempts to get as much across the midline as I could. The pelvis under fluoroscopy and then opened up the cage proximate 5 turns and prior to placement of the cage packed the local autograft mix and BMP both laterally and anteriorly. The replacement cage on posterior fluoroscopy confirmed good position of everything I was able get cage just across midline not only over to the other side however. I elected to leave it in place and it opened up the disc space opened up the neural foramen. Then packed this with Gelfoam under AP and lateral fluoroscopy I placed to S1 screws that were 6555 slightly bigger than the previous screws placed  then to 75 mm rods were contoured and placed all nuts were tightened and anchored in place recheck the foramen to confirm patency aggressive decortication  was carried out along facet complexes bilaterally down to S1 TO L3. I also overlaid bone on top of the residual lamina at L3 on the left. Then placed a drain sprayed vancomycin powder and the wound closed the muscle fascia with interrupted Vicryls injected X [place]rella Bank powder irrigated and closed the remainder the wound with interrupted Vicryls and running 4 subcuticular. Occasional needle counts sponge counts were correct.

## 2015-03-21 NOTE — Progress Notes (Signed)
Utilization review completed.  

## 2015-03-22 MED ORDER — SODIUM CHLORIDE 0.9 % IV BOLUS (SEPSIS)
500.0000 mL | Freq: Once | INTRAVENOUS | Status: AC
  Administered 2015-03-22: 500 mL via INTRAVENOUS

## 2015-03-22 MED FILL — Heparin Sodium (Porcine) Inj 1000 Unit/ML: INTRAMUSCULAR | Qty: 30 | Status: AC

## 2015-03-22 MED FILL — Sodium Chloride IV Soln 0.9%: INTRAVENOUS | Qty: 1000 | Status: AC

## 2015-03-22 NOTE — Progress Notes (Signed)
Subjective: Patient reports No leg pain severe back pain  Objective: Vital signs in last 24 hours: Temp:  [97 F (36.1 C)-98.5 F (36.9 C)] 98.1 F (36.7 C) (10/06 0615) Pulse Rate:  [75-91] 75 (10/06 0615) Resp:  [10-21] 20 (10/06 0615) BP: (101-142)/(40-69) 117/44 mmHg (10/06 0615) SpO2:  [93 %-100 %] 98 % (10/06 0615) Weight:  [52.192 kg (115 lb 1 oz)] 52.192 kg (115 lb 1 oz) (10/05 1209)  Intake/Output from previous day: 10/05 0701 - 10/06 0700 In: 1950 [I.V.:1950] Out: 920 [Urine:585; Drains:140; Blood:195] Intake/Output this shift:    Strength out of 5 wound clean dry and intact  Lab Results: No results for input(s): WBC, HGB, HCT, PLT in the last 72 hours. BMET No results for input(s): NA, K, CL, CO2, GLUCOSE, BUN, CREATININE, CALCIUM in the last 72 hours.  Studies/Results: Dg Lumbar Spine 2-3 Views  03/21/2015   CLINICAL DATA:  Operative imaging during lumbar spine posterior fusion surgery.  EXAM: DG C-ARM 61-120 MIN; LUMBAR SPINE - 2-3 VIEW  COMPARISON:  CT, 02/21/2015  FINDINGS: Operative images show placement of additional pedicle screws at L3 and S1. The new intervertebral cage maintains disc height at L3-L4. Intervertebral cages at L4-L5 are stable.  There is no acute fracture or evidence of an operative complication.  IMPRESSION: Intraoperative imaging during lumbar spine fusion surgery as described.   Electronically Signed   By: Lajean Manes M.D.   On: 03/21/2015 16:12   Dg C-arm 61-120 Min  03/21/2015   CLINICAL DATA:  Operative imaging during lumbar spine posterior fusion surgery.  EXAM: DG C-ARM 61-120 MIN; LUMBAR SPINE - 2-3 VIEW  COMPARISON:  CT, 02/21/2015  FINDINGS: Operative images show placement of additional pedicle screws at L3 and S1. The new intervertebral cage maintains disc height at L3-L4. Intervertebral cages at L4-L5 are stable.  There is no acute fracture or evidence of an operative complication.  IMPRESSION: Intraoperative imaging during lumbar  spine fusion surgery as described.   Electronically Signed   By: Lajean Manes M.D.   On: 03/21/2015 16:12    Assessment/Plan: Progressive mobilization today with physical and occupational therapy.  LOS: 1 day     Khalil Szczepanik P 03/22/2015, 7:36 AM

## 2015-03-22 NOTE — Evaluation (Signed)
Physical Therapy Evaluation Patient Details Name: Katherine [name] MRN: [id number] DOB: Dec 04, 1928 Today's Date: 03/22/2015   History of Present Illness  Transforaminal Lumbar Interbody Fusion - Lumbar three-four, Posterior Lumbar Fusion Lumbar three-Sacral one, instrumentation Lumbar three-Sacral one, Exploration of Fusion and removal of hardware Lumbar four-five   Clinical Impression  Patient is s/p above surgery presenting with functional limitations due to the deficits listed below (see PT Problem List). Required min assist for bed mobility but ambulates with a rolling walker at a min guard level for safety, using a rolling walker for light support. Able to navigate several steps today without physical assist and cues for safe technique.Patient will benefit from skilled PT to increase their independence and safety with mobility to allow discharge to the venue listed below.       Follow Up Recommendations Supervision for mobility/OOB    Equipment Recommendations  None recommended by PT    Recommendations for Other Services OT consult     Precautions / Restrictions Precautions Precautions: Back Precaution Booklet Issued: Yes (comment) Precaution Comments: reviewed back precautions Required Braces or Orthoses: Spinal Brace Spinal Brace: Lumbar corset;Applied in sitting position Restrictions Weight Bearing Restrictions: No      Mobility  Bed Mobility Overal bed mobility: Needs Assistance Bed Mobility: Rolling;Sidelying to Sit Rolling: Min guard Sidelying to sit: Min guard;Min assist       General bed mobility comments: educated on log roll technique using rail as needed. Min assist for truncal support to rise to seated position. VC for technique throughout  Transfers Overall transfer level: Needs assistance Equipment used: Rolling walker (2 wheeled) Transfers: Sit to/from Stand Sit to Stand: Min guard         General transfer comment: Min guard for safety. VC for  hand placement  Ambulation/Gait Ambulation/Gait assistance: Min guard Ambulation Distance (Feet): 125 Feet Assistive device: Rolling walker (2 wheeled) Gait Pattern/deviations: Step-through pattern;Decreased stride length;Trunk flexed Gait velocity: decreased   General Gait Details: Educated on safe DME use with a rolling walker. VC for upright posture and walker placement for proximity. Min guard for safety throughout but no buckling or overt loss of balance noted.  Stairs Stairs: Yes Stairs assistance: Min guard Stair Management: One rail Left;Two rails;Step to pattern;Sideways;Forwards Number of Stairs: 4 (x2) General stair comments: Practiced forward and sideways approach with min guard for safety. Some instability but able to self correct with moderate use of rail. No buckling however. Cues for sequencing and safety.  Wheelchair Mobility    Modified Rankin (Stroke Patients Only)       Balance Overall balance assessment: Needs assistance Sitting-balance support: No upper extremity supported;Feet supported Sitting balance-Leahy Scale: Fair     Standing balance support: No upper extremity supported Standing balance-Leahy Scale: Fair                               Pertinent Vitals/Pain Pain Assessment: 0-10 Pain Score:  ("pretty good") Pain Location: back Pain Descriptors / Indicators: Aching Pain Intervention(s): Monitored during session;Repositioned    Home Living Family/patient expects to be discharged to:: Private residence Living Arrangements: Spouse/significant other Available Help at Discharge: Family;Available 24 hours/day Type of Home: House Home Access: Stairs to enter Entrance Stairs-Rails: Can reach both;Left;Right Entrance Stairs-Number of Steps: 7 but split landings so 3 at a time Home Layout: Two level Home Equipment: Clinical cytogeneticist - 2 wheels      Prior Function Level of Independence: Independent  Hand  Dominance   Dominant Hand: Right    Extremity/Trunk Assessment   Upper Extremity Assessment: Defer to OT evaluation           Lower Extremity Assessment: Generalized weakness         Communication   Communication: No difficulties  Cognition Arousal/Alertness: Awake/alert Behavior During Therapy: WFL for tasks assessed/performed Overall Cognitive Status: Within Functional Limits for tasks assessed                      General Comments General comments (skin integrity, edema, etc.): Reviewed application of back brace    Exercises        Assessment/Plan    PT Assessment Patient needs continued PT services  PT Diagnosis Difficulty walking;Abnormality of gait;Generalized weakness;Acute pain   PT Problem List Decreased strength;Decreased activity tolerance;Decreased balance;Decreased mobility;Decreased knowledge of use of DME;Decreased knowledge of precautions  PT Treatment Interventions DME instruction;Gait training;Stair training;Functional mobility training;Therapeutic activities;Therapeutic exercise;Balance training;Neuromuscular re-education;Patient/family education   PT Goals (Current goals can be found in the Care Plan section) Acute Rehab PT Goals Patient Stated Goal: Get well PT Goal Formulation: With patient Time For Goal Achievement: 04/05/15 Potential to Achieve Goals: Good    Frequency Min 5X/week   Barriers to discharge        Co-evaluation               End of Session Equipment Utilized During Treatment: Back brace Activity Tolerance: Patient tolerated treatment well Patient left: in chair;with call bell/phone within reach;with nursing/sitter in room Nurse Communication: Mobility status (NT)         Time: 0539-7673 PT Time Calculation (min) (ACUTE ONLY): 22 min   Charges:   PT Evaluation $Initial PT Evaluation Tier I: 1 Procedure     PT G CodesEllouise Newer 03/22/2015, 12:05 PM  [name],  [place]

## 2015-03-22 NOTE — Progress Notes (Signed)
Pt BP has been running low, last BP checked was 90/40 manually. Pt is asymptomatic.  MD notified and no new orders given.  Will continue to closely monitor.   Fredrich Romans, RN

## 2015-03-22 NOTE — Progress Notes (Signed)
Occupational Therapy Evaluation Patient Details Name: Katherine [name] MRN: [id number] DOB: 1929/01/11 Today's Date: 03/22/2015    History of Present Illness Transforaminal Lumbar Interbody Fusion - Lumbar three-four, Posterior Lumbar Fusion Lumbar three-Sacral one, instrumentation Lumbar three-Sacral one, Exploration of Fusion and removal of hardware Lumbar four-five    Clinical Impression   PAtient presents to OT with decreased ADL independence. Will benefit from OT to maximize function post-op and to facilitate a safe discharge plan.    Follow Up Recommendations  No OT follow up;Supervision - Intermittent    Equipment Recommendations  None recommended by OT (has all necessary equipment)    Recommendations for Other Services       Precautions / Restrictions Precautions Precautions: Back Precaution Booklet Issued: Yes (comment) Precaution Comments: reviewed back precautions Required Braces or Orthoses: Spinal Brace Spinal Brace: Lumbar corset;Applied in sitting position Restrictions Weight Bearing Restrictions: No      Mobility   Transfers Overall transfer level: Needs assistance Equipment used: Rolling walker (2 wheeled) Transfers: Sit to/from Stand Sit to Stand: Supervision         General transfer comment: Min guard for safety. VC for hand placement    Balance                               ADL Overall ADL's : Needs assistance/impaired Eating/Feeding: Independent   Grooming: Wash/dry hands;Wash/dry face;Oral care;Supervision/safety;Standing   Upper Body Bathing: Set up;Sitting   Lower Body Bathing: Minimal assistance;Sit to/from stand   Upper Body Dressing : Set up;Sitting   Lower Body Dressing: Minimal assistance;Sit to/from stand   Toilet Transfer: Supervision/safety;Ambulation;Regular Toilet;RW   Toileting- Clothing Manipulation and Hygiene: Supervision/safety;Sit to/from stand       Functional mobility during ADLs:  Supervision/safety;Rolling walker General ADL Comments: Reviewed all back precautions with patient. She was able to recall all of them. Educated her on ADL techniques within back precautions for grooming activities and LB self-care. Patient is familiar with these techniques from prior back surgery. Patient tolerated session well.     Vision     Perception     Praxis      Pertinent Vitals/Pain Pain Assessment: 0-10 Pain Score: 6  Pain Location: back Pain Descriptors / Indicators: Aching Pain Intervention(s): Monitored during session;Limited activity within patient's tolerance     Hand Dominance Right   Extremity/Trunk Assessment Upper Extremity Assessment Upper Extremity Assessment: Overall WFL for tasks assessed   Lower Extremity Assessment Lower Extremity Assessment: Defer to PT evaluation       Communication Communication Communication: No difficulties   Cognition Arousal/Alertness: Awake/alert Behavior During Therapy: WFL for tasks assessed/performed Overall Cognitive Status: Within Functional Limits for tasks assessed                     General Comments       Exercises       Shoulder Instructions      Home Living Family/patient expects to be discharged to:: Private residence Living Arrangements: Spouse/significant other Available Help at Discharge: Family;Available 24 hours/day Type of Home: House Home Access: Stairs to enter [employer] of Steps: 7 but split landings so 3 at a time Entrance Stairs-Rails: Can reach both;Left;Right Home Layout: Two level Alternate Level Stairs-Number of Steps: stair lift that pt uses due to COPD   Bathroom Shower/Tub: Occupational psychologist: Handicapped height Bathroom Accessibility: Yes How Accessible: Accessible via walker Home Equipment: Clinical cytogeneticist -  2 wheels          Prior Functioning/Environment Level of Independence: Independent             OT Diagnosis: Acute  pain   OT Problem List: Pain;Decreased knowledge of precautions   OT Treatment/Interventions: Self-care/ADL training;DME and/or AE instruction;Therapeutic activities;Patient/family education    OT Goals(Current goals can be found in the care plan section) Acute Rehab OT Goals Patient Stated Goal: go home Saturday OT Goal Formulation: With patient Time For Goal Achievement: 04/05/15 Potential to Achieve Goals: Good  OT Frequency: Min 2X/week   Barriers to D/C:            Co-evaluation              End of Session Equipment Utilized During Treatment: Rolling walker;Back brace Nurse Communication: Mobility status  Activity Tolerance: Patient tolerated treatment well Patient left: in chair;with call bell/phone within reach;with family/visitor present   Time: 1142-1205 OT Time Calculation (min): 23 min Charges:  OT General Charges $OT Visit: 1 Procedure OT Evaluation $Initial OT Evaluation Tier I: 1 Procedure OT Treatments $Self Care/Home Management : 8-22 mins G-Codes:    Katherine [name] A Apr 04, 2015, 12:25 PM

## 2015-03-23 NOTE — Progress Notes (Signed)
Patient ID: Katherine [name], female   DOB: July 29, 1928, 81 y.o.   MRN: [id number] Doing well today no leg pain little more back soreness but because of her blood pressure being low she was not getting her pain medication.  Awake alert strength 5 out of 5  Continue mobilizes PT probable discharge tomorrow or Sunday.

## 2015-03-23 NOTE — Progress Notes (Signed)
OT Cancellation Note  Patient Details Name: Katherine [name] MRN: [id number] DOB: [birth year]   Cancelled Treatment:    Reason Eval/Treat Not Completed: Other (comment) met with pt. And spouse this am for skilled OT. Pt. Standing at sink with spouse performing grooming tasks.  Pt. States she had recent surgery so she had no questions or needs on how to complete ADLS including donning brace, toileting, and bathing.  Husband nodding "yes" and also agreeing that they are fine from an OT standpoint and had no questions or need for review of any of the stated goals.  Pt. States d/c home likely tom. And "ill walk with the other guys (PT) if they come by but i think im fine with everything else".   Will alert OTR/L for sign off from OT. Pt. Agees.   Janice Coffin, COTA/L 03/23/2015, 8:54 AM

## 2015-03-23 NOTE — Progress Notes (Signed)
Physical Therapy Treatment Patient Details Name: Katherine [name] MRN: [id number] DOB: 03/03/1929 Today's Date: 03/23/2015    History of Present Illness Transforaminal Lumbar Interbody Fusion - Lumbar three-four, Posterior Lumbar Fusion Lumbar three-Sacral one, instrumentation Lumbar three-Sacral one, Exploration of Fusion and removal of hardware Lumbar four-five     PT Comments    Patient is progressing well towards physical therapy goals, ambulating up to 225 feet with a rolling walker for support. Moderately guarded but stable with this device. Safely navigates stairs without physical assistance. Will follow and progress until d/c.   Follow Up Recommendations  Supervision for mobility/OOB     Equipment Recommendations  None recommended by PT    Recommendations for Other Services OT consult     Precautions / Restrictions Precautions Precautions: Back Precaution Booklet Issued: Yes (comment) Precaution Comments: reviewed back precautions Required Braces or Orthoses: Spinal Brace Spinal Brace: Lumbar corset;Applied in sitting position Restrictions Weight Bearing Restrictions: No    Mobility  Bed Mobility Overal bed mobility: Needs Assistance Bed Mobility: Rolling;Sidelying to Sit;Sit to Sidelying Rolling: Supervision Sidelying to sit: Supervision     Sit to sidelying: Supervision General bed mobility comments: supervision for safety cues for technique to maintain back precautions  Transfers Overall transfer level: Needs assistance Equipment used: Rolling walker (2 wheeled) Transfers: Sit to/from Stand Sit to Stand: Supervision         General transfer comment: Supervision for safety. VC for hand placement. Stable upon rising with RW for support.  Ambulation/Gait Ambulation/Gait assistance: Supervision Ambulation Distance (Feet): 225 Feet Assistive device: Rolling walker (2 wheeled) Gait Pattern/deviations: Step-through pattern;Decreased stride length;Trunk  flexed Gait velocity: decreased   General Gait Details: VC for upright posture and walker placement for proximity. No buckling or loss of balance noted during bout. Some difficulty maneuvering RW in narrow spaces, requiring cues   Stairs Stairs: Yes Stairs assistance: Supervision Stair Management: One rail Right;Step to pattern;Forwards Number of Stairs: 2 (x2) General stair comments: VC for sequencing, use of single rail on Rt which she reports mimics her home entrance. No loss of balance, states she feels confident with this task  Wheelchair Mobility    Modified Rankin (Stroke Patients Only)       Balance                                    Cognition Arousal/Alertness: Awake/alert Behavior During Therapy: WFL for tasks assessed/performed Overall Cognitive Status: Within Functional Limits for tasks assessed                      Exercises      General Comments General comments (skin integrity, edema, etc.): Lt IV site which was recently pulled out by nursing was bleeding when PT entered room. RN notified and changed/reinforced dressing. Able to doffe brace appropriately      Pertinent Vitals/Pain Pain Assessment: 0-10 Pain Score: 5  Pain Location: back Pain Descriptors / Indicators: Aching Pain Intervention(s): Monitored during session;Repositioned    Home Living                      Prior Function            PT Goals (current goals can now be found in the care plan section) Acute Rehab PT Goals Patient Stated Goal: Get well PT Goal Formulation: With patient Time For Goal Achievement: 04/05/15 Potential to Achieve Goals:  Good Progress towards PT goals: Progressing toward goals    Frequency  Min 5X/week    PT Plan Current plan remains appropriate    Co-evaluation             End of Session Equipment Utilized During Treatment: Back brace Activity Tolerance: Patient tolerated treatment well Patient left: with call  bell/phone within reach;in bed     Time: 1275-1700 PT Time Calculation (min) (ACUTE ONLY): 19 min  Charges:  $Gait Training: 8-22 mins                    G Codes:      [name] 2015-04-02, 2:29 PM  Camille Bal [place], [place]

## 2015-03-23 NOTE — Care Management Important Message (Signed)
Important Message  Patient Details  Name: Katherine [name] MRN: [id number] Date of Birth: 29-Dec-1928   Medicare Important Message Given:  Yes-second notification given    [name] 03/23/2015, 12:18 PM

## 2015-03-24 MED ORDER — OXYCODONE-ACETAMINOPHEN 5-325 MG PO TABS: 1.0000 | ORAL_TABLET | Freq: Four times a day (QID) | ORAL | Status: DC | PRN

## 2015-03-24 MED ORDER — CYCLOBENZAPRINE HCL 10 MG PO TABS: 10.0000 mg | ORAL_TABLET | Freq: Three times a day (TID) | ORAL | Status: DC | PRN

## 2015-03-24 NOTE — Discharge Summary (Signed)
Physician Discharge Summary  Patient ID: Katherine [name] MRN: [id number] DOB/AGE: December 24, 1928 81 y.o.  Admit date: 03/21/2015 Discharge date: 03/24/2015  Admission Diagnoses:spinal stenosis lumbar spine, L3/4,  Osteoarthritis with radiculopathy lumbar spine  Discharge Diagnoses:  Active Problems:   Spinal stenosis of lumbar region   Discharged Condition: good  Hospital Course: Katherine [name] was admitted and taken to the operating room for a lumbar fusion from L3-S1. Post op she has done well. She is ambulating, voiding, and tolerating a regular diet. Her wound dressing is clean, dry, and without signs of infection. She is moving all extremities well. Strength is full in lower extremities   Treatments: surgery: #1 decompressive lumbar laminectomy L3-4 and transforaminal lumbar interbody fusion L3-4 from the right using the globus [employer] expandable cage packed with locally harvested autograft mixed with  alostem morsels and BMP.  #2  exploration of fusion removal of hardware L4-5  #3 cortical screw fixation L3-S1 with placement of new screws at L3 bilaterally and S1 bilaterally  #4 posterior lateral arthrodesis L3-S1 using the locally harvested autograft mixed with BMP and allostem morsels and strips    Discharge Exam: Blood pressure 124/37, pulse 99, temperature 98.6 F (37 C), temperature source Oral, resp. rate 20, height 5\' 2"  (1.575 m), weight 52.192 kg (115 lb 1 oz), SpO2 90 %. General appearance: alert, cooperative, appears stated age and no distress Neurologic: Alert and oriented X 3, normal strength and tone. Normal symmetric reflexes. Normal coordination and gait  Disposition: 01-Home or Self Care Herniated Nucleus Pulposus    Medication List    TAKE these medications        albuterol (2.5 MG/3ML) 0.083% nebulizer solution  Commonly known as:  PROVENTIL  Take 2.5 mg by nebulization every 6 (six) hours as needed.     aspirin 81 MG tablet  Take 81 mg by mouth  daily.     atorvastatin 10 MG tablet  Commonly known as:  LIPITOR  Take 1 tablet (10 mg total) by mouth daily.     B-COMPLEX/B-12 TR PO  Take 1 tablet by mouth daily.     cholecalciferol 1000 UNITS tablet  Commonly known as:  VITAMIN D  Take 1,000 Units by mouth daily.     ciprofloxacin 250 MG tablet  Commonly known as:  CIPRO  Take 1 tablet (250 mg total) by mouth 2 (two) times daily.     cyclobenzaprine 10 MG tablet  Commonly known as:  FLEXERIL  Take 1 tablet (10 mg total) by mouth 3 (three) times daily as needed for muscle spasms.     cyclobenzaprine 10 MG tablet  Commonly known as:  FLEXERIL  Take 1 tablet (10 mg total) by mouth 3 (three) times daily as needed for muscle spasms.     famotidine 40 MG tablet  Commonly known as:  PEPCID  Take 1 tablet (40 mg total) by mouth at bedtime as needed for heartburn.     HYDROcodone-acetaminophen 5-325 MG tablet  Commonly known as:  NORCO  Take 1 tablet by mouth every 6 (six) hours as needed for moderate pain.     IRON PO  Take 1 tablet by mouth daily.     oxyCODONE-acetaminophen 5-325 MG tablet  Commonly known as:  ROXICET  Take 1 tablet by mouth every 6 (six) hours as needed for severe pain.     SYSTANE OP  Apply 1 drop to eye as needed (dry eyes).     traMADol 50 MG tablet  Commonly known  as:  ULTRAM  Take 1 tablet (50 mg total) by mouth every 6 (six) hours.     traZODone 50 MG tablet  Commonly known as:  DESYREL  Take 2 tablets (100 mg total) by mouth at bedtime.     Vilazodone HCl 40 MG Tabs  Commonly known as:  VIIBRYD  Take 1 tablet (40 mg total) by mouth daily.     VITAMIN E PO  Take 1 tablet by mouth daily.           Follow-up Information    Follow up with [facility], MD.   Specialty:  Neurosurgery   Why:  as scheduled   Contact information:   [address] [place] [address] [phone]       Signed: Winfield Cunas 03/24/2015, 10:18 AM

## 2015-03-24 NOTE — Plan of Care (Signed)
Problem: Consults Goal: Diagnosis - Spinal Surgery Outcome: Completed/Met Date Met:  03/24/15 Cervical Spine Fusion

## 2015-03-24 NOTE — Progress Notes (Signed)
Patient ID: Katherine [name], female   DOB: [birth year], 81 y.o.   MRN: [id number] BP 124/37 mmHg  Pulse 99  Temp(Src) 98.6 F (37 C) (Oral)  Resp 20  Ht 5\' 2"  (1.575 m)  Wt 52.192 kg (115 lb 1 oz)  BMI 21.04 kg/m2  SpO2 90% Alert and oriented x 4, will discharge today.

## 2015-03-24 NOTE — Discharge Planning (Signed)
Patient and husband recieved discharge instructions and prescriptions. Patient and husband have no questions or concerns at this time. Patient taken out via wheelchair by staff and taken home via husband. All belongings sent with patient.

## 2015-04-26 DIAGNOSIS — M5137 Other intervertebral disc degeneration, lumbosacral region: Secondary | ICD-10-CM | POA: Diagnosis not present

## 2015-04-26 DIAGNOSIS — M4806 Spinal stenosis, lumbar region: Secondary | ICD-10-CM | POA: Diagnosis not present

## 2015-05-04 ENCOUNTER — Encounter: Payer: Self-pay | Admitting: Internal Medicine

## 2015-05-04 ENCOUNTER — Ambulatory Visit (INDEPENDENT_AMBULATORY_CARE_PROVIDER_SITE_OTHER): Payer: Medicare PPO | Admitting: Internal Medicine

## 2015-05-04 VITALS — BP 100/60 | HR 140 | Temp 97.1°F | Resp 20 | Wt 106.0 lb

## 2015-05-04 DIAGNOSIS — E869 Volume depletion, unspecified: Secondary | ICD-10-CM

## 2015-05-04 DIAGNOSIS — A084 Viral intestinal infection, unspecified: Secondary | ICD-10-CM

## 2015-05-04 DIAGNOSIS — B009 Herpesviral infection, unspecified: Secondary | ICD-10-CM | POA: Diagnosis not present

## 2015-05-04 DIAGNOSIS — R000 Tachycardia, unspecified: Secondary | ICD-10-CM | POA: Diagnosis not present

## 2015-05-04 DIAGNOSIS — R112 Nausea with vomiting, unspecified: Secondary | ICD-10-CM

## 2015-05-04 LAB — POCT URINALYSIS DIPSTICK
Bilirubin, UA: NEGATIVE
Glucose, UA: NEGATIVE
LEUKOCYTES UA: NEGATIVE
NITRITE UA: NEGATIVE
PH UA: 6.5
Spec Grav, UA: 1.02
UROBILINOGEN UA: 0.2

## 2015-05-04 LAB — CBC WITH DIFFERENTIAL/PLATELET
Basophils Absolute: 0 10*3/uL (ref 0.0–0.1)
Basophils Relative: 0 % (ref 0–1)
Eosinophils Absolute: 0 10*3/uL (ref 0.0–0.7)
Eosinophils Relative: 0 % (ref 0–5)
HCT: 39 % (ref 36.0–46.0)
HEMOGLOBIN: 11.7 g/dL — AB (ref 12.0–15.0)
LYMPHS ABS: 2.5 10*3/uL (ref 0.7–4.0)
Lymphocytes Relative: 20 % (ref 12–46)
MCH: 24.8 pg — AB (ref 26.0–34.0)
MCHC: 30 g/dL (ref 30.0–36.0)
MCV: 82.6 fL (ref 78.0–100.0)
MONOS PCT: 10 % (ref 3–12)
MPV: 10.5 fL (ref 8.6–12.4)
Monocytes Absolute: 1.3 10*3/uL — ABNORMAL HIGH (ref 0.1–1.0)
NEUTROS ABS: 8.8 10*3/uL — AB (ref 1.7–7.7)
NEUTROS PCT: 70 % (ref 43–77)
Platelets: 526 10*3/uL — ABNORMAL HIGH (ref 150–400)
RBC: 4.72 MIL/uL (ref 3.87–5.11)
RDW: 14.5 % (ref 11.5–15.5)
WBC: 12.6 10*3/uL — ABNORMAL HIGH (ref 4.0–10.5)

## 2015-05-04 MED ORDER — VALACYCLOVIR HCL 500 MG PO TABS: ORAL_TABLET | ORAL | Status: DC

## 2015-05-04 MED ORDER — ONDANSETRON HCL 4 MG/2ML IJ SOLN
4.0000 mg | Freq: Once | INTRAMUSCULAR | Status: AC
  Administered 2015-05-04: 4 mg via INTRAMUSCULAR

## 2015-05-04 MED ORDER — ONDANSETRON HCL 4 MG PO TABS: 4.0000 mg | ORAL_TABLET | Freq: Three times a day (TID) | ORAL | Status: DC | PRN

## 2015-05-04 NOTE — Patient Instructions (Signed)
CBC with differential is pending. Zofran IM given. Prescription for Zofran tablets called in. Need to force clear liquids every 2 hours. If not better in 24 hours need to go to ER for IV fluids. Take Valtrex 500 mg twice daily for 5 days for rash on buttock.

## 2015-05-04 NOTE — Progress Notes (Signed)
   Subjective:    Patient ID: Katherine [name], female    DOB: 25-Feb-1929, 81 y.o.   MRN: [id number]  HPI Onset Tuesday night of vomiting and epigastric discomfort. No hematemesis. Felt a bit constipated but then had diarrhea. Has had persistent vomiting, diarrhea, vague epigastric discomfort. No fever or shaking chills. No blood in bowel movement. No melena. Feels dizzy with standing.   Review of Systems     Objective:   Physical Exam  Blood pressure standing is 90 over palpable. Pulse is 140 standing. Dipstick UA shows no evidence of infection. CBC shows white blood cell count 12,600. Skin warm and dry. TMs and pharynx are clear. Neck is supple without JVD thyromegaly or carotid bruits. Chest clear. Cardiac exam: tachycardia regular rate and rhythm abdomen no hepatosplenomegaly masses or significant tenderness. She has herpetic rash right buttock      Assessment & Plan:  Volume  depletion  Viral gastroenteritis  Sinus tachycardia secondary to volume depletion  Herpes simplex type II right buttock  Plan: Patient is to force fluids every 2 hours. Zofran 4 mg IM given in office and prescription for 4 mg tablets given to take every 8 hours when necessary nausea. If not better tomorrow she is to go to emergency department for IV fluids and evaluation. Take Valtrex 500 mg twice daily for 5 days.

## 2015-05-07 ENCOUNTER — Emergency Department (HOSPITAL_COMMUNITY)
Admission: EM | Admit: 2015-05-07 | Discharge: 2015-05-07 | Disposition: A | Discharge status: Home / Self Care | Payer: Medicare PPO | Attending: Emergency Medicine | Admitting: Emergency Medicine

## 2015-05-07 ENCOUNTER — Telehealth: Payer: Self-pay | Admitting: Internal Medicine

## 2015-05-07 ENCOUNTER — Encounter (HOSPITAL_COMMUNITY): Payer: Self-pay | Admitting: Emergency Medicine

## 2015-05-07 ENCOUNTER — Emergency Department (HOSPITAL_COMMUNITY): Payer: Medicare PPO

## 2015-05-07 DIAGNOSIS — D649 Anemia, unspecified: Secondary | ICD-10-CM | POA: Insufficient documentation

## 2015-05-07 DIAGNOSIS — M1990 Unspecified osteoarthritis, unspecified site: Secondary | ICD-10-CM | POA: Insufficient documentation

## 2015-05-07 DIAGNOSIS — R1032 Left lower quadrant pain: Secondary | ICD-10-CM | POA: Insufficient documentation

## 2015-05-07 DIAGNOSIS — F329 Major depressive disorder, single episode, unspecified: Secondary | ICD-10-CM | POA: Diagnosis not present

## 2015-05-07 DIAGNOSIS — J449 Chronic obstructive pulmonary disease, unspecified: Secondary | ICD-10-CM | POA: Insufficient documentation

## 2015-05-07 DIAGNOSIS — Z87891 Personal history of nicotine dependence: Secondary | ICD-10-CM | POA: Diagnosis not present

## 2015-05-07 DIAGNOSIS — Z9049 Acquired absence of other specified parts of digestive tract: Secondary | ICD-10-CM | POA: Diagnosis not present

## 2015-05-07 DIAGNOSIS — Z90710 Acquired absence of both cervix and uterus: Secondary | ICD-10-CM | POA: Diagnosis not present

## 2015-05-07 DIAGNOSIS — K5900 Constipation, unspecified: Secondary | ICD-10-CM | POA: Diagnosis not present

## 2015-05-07 DIAGNOSIS — E785 Hyperlipidemia, unspecified: Secondary | ICD-10-CM | POA: Diagnosis not present

## 2015-05-07 DIAGNOSIS — R112 Nausea with vomiting, unspecified: Secondary | ICD-10-CM | POA: Diagnosis not present

## 2015-05-07 DIAGNOSIS — Z8711 Personal history of peptic ulcer disease: Secondary | ICD-10-CM | POA: Diagnosis not present

## 2015-05-07 DIAGNOSIS — E538 Deficiency of other specified B group vitamins: Secondary | ICD-10-CM | POA: Diagnosis not present

## 2015-05-07 DIAGNOSIS — Z86018 Personal history of other benign neoplasm: Secondary | ICD-10-CM | POA: Diagnosis not present

## 2015-05-07 DIAGNOSIS — R1031 Right lower quadrant pain: Secondary | ICD-10-CM | POA: Diagnosis not present

## 2015-05-07 DIAGNOSIS — Z79899 Other long term (current) drug therapy: Secondary | ICD-10-CM | POA: Insufficient documentation

## 2015-05-07 DIAGNOSIS — R109 Unspecified abdominal pain: Secondary | ICD-10-CM | POA: Diagnosis not present

## 2015-05-07 LAB — I-STAT CHEM 8, ED
BUN: 32 mg/dL — ABNORMAL HIGH (ref 6–20)
CALCIUM ION: 1.2 mmol/L (ref 1.13–1.30)
CREATININE: 1.4 mg/dL — AB (ref 0.44–1.00)
Chloride: 99 mmol/L — ABNORMAL LOW (ref 101–111)
GLUCOSE: 118 mg/dL — AB (ref 65–99)
HCT: 38 % (ref 36.0–46.0)
Hemoglobin: 12.9 g/dL (ref 12.0–15.0)
POTASSIUM: 3.2 mmol/L — AB (ref 3.5–5.1)
Sodium: 138 mmol/L (ref 135–145)
TCO2: 25 mmol/L (ref 0–100)

## 2015-05-07 LAB — URINALYSIS, ROUTINE W REFLEX MICROSCOPIC
Bilirubin Urine: NEGATIVE
Glucose, UA: NEGATIVE mg/dL
HGB URINE DIPSTICK: NEGATIVE
Ketones, ur: NEGATIVE mg/dL
LEUKOCYTES UA: NEGATIVE
NITRITE: NEGATIVE
PROTEIN: NEGATIVE mg/dL
pH: 5 (ref 5.0–8.0)

## 2015-05-07 LAB — COMPREHENSIVE METABOLIC PANEL
ALK PHOS: 81 U/L (ref 38–126)
ALT: 12 U/L — AB (ref 14–54)
ANION GAP: 9 (ref 5–15)
AST: 21 U/L (ref 15–41)
Albumin: 3.5 g/dL (ref 3.5–5.0)
BUN: 31 mg/dL — ABNORMAL HIGH (ref 6–20)
CALCIUM: 9.3 mg/dL (ref 8.9–10.3)
CO2: 27 mmol/L (ref 22–32)
CREATININE: 1.47 mg/dL — AB (ref 0.44–1.00)
Chloride: 101 mmol/L (ref 101–111)
GFR, EST AFRICAN AMERICAN: 37 mL/min — AB (ref >60)
GFR, EST NON AFRICAN AMERICAN: 32 mL/min — AB (ref >60)
Glucose, Bld: 120 mg/dL — ABNORMAL HIGH (ref 65–99)
Potassium: 3.3 mmol/L — ABNORMAL LOW (ref 3.5–5.1)
SODIUM: 137 mmol/L (ref 135–145)
TOTAL PROTEIN: 6.8 g/dL (ref 6.5–8.1)
Total Bilirubin: 0.4 mg/dL (ref 0.3–1.2)

## 2015-05-07 LAB — CBC
HCT: 35.7 % — ABNORMAL LOW (ref 36.0–46.0)
HEMOGLOBIN: 10.9 g/dL — AB (ref 12.0–15.0)
MCH: 24.4 pg — ABNORMAL LOW (ref 26.0–34.0)
MCHC: 30.5 g/dL (ref 30.0–36.0)
MCV: 80 fL (ref 78.0–100.0)
PLATELETS: 408 10*3/uL — AB (ref 150–400)
RBC: 4.46 MIL/uL (ref 3.87–5.11)
RDW: 14.4 % (ref 11.5–15.5)
WBC: 10.2 10*3/uL (ref 4.0–10.5)

## 2015-05-07 LAB — LIPASE, BLOOD: LIPASE: 46 U/L (ref 11–51)

## 2015-05-07 LAB — I-STAT CG4 LACTIC ACID, ED
LACTIC ACID, VENOUS: 1.58 mmol/L (ref 0.5–2.0)
LACTIC ACID, VENOUS: 0.55 mmol/L (ref 0.5–2.0)

## 2015-05-07 MED ORDER — ONDANSETRON 4 MG PO TBDP: 4.0000 mg | ORAL_TABLET | Freq: Three times a day (TID) | ORAL | Status: DC | PRN

## 2015-05-07 MED ORDER — BARIUM SULFATE 2.1 % PO SUSP
ORAL | Status: AC
  Filled 2015-05-07: qty 1

## 2015-05-07 MED ORDER — MORPHINE SULFATE (PF) 4 MG/ML IV SOLN: 4.0000 mg | Freq: Once | INTRAVENOUS | Status: DC

## 2015-05-07 MED ORDER — SODIUM CHLORIDE 0.9 % IV BOLUS (SEPSIS)
1000.0000 mL | Freq: Once | INTRAVENOUS | Status: AC
  Administered 2015-05-07: 1000 mL via INTRAVENOUS

## 2015-05-07 MED ORDER — ONDANSETRON HCL 4 MG/2ML IJ SOLN
4.0000 mg | Freq: Once | INTRAMUSCULAR | Status: AC | PRN
  Administered 2015-05-07: 4 mg via INTRAVENOUS
  Filled 2015-05-07: qty 2

## 2015-05-07 MED ORDER — IOHEXOL 300 MG/ML  SOLN
75.0000 mL | Freq: Once | INTRAMUSCULAR | Status: AC | PRN
  Administered 2015-05-07: 75 mL via INTRAVENOUS

## 2015-05-07 MED ORDER — SODIUM CHLORIDE 0.9 % IV SOLN
1000.0000 mL | Freq: Once | INTRAVENOUS | Status: AC
Start: 2015-05-07 — End: 2015-05-07
  Administered 2015-05-07: 1000 mL via INTRAVENOUS

## 2015-05-07 MED ORDER — ONDANSETRON HCL 4 MG/2ML IJ SOLN
4.0000 mg | Freq: Once | INTRAMUSCULAR | Status: AC
  Administered 2015-05-07: 4 mg via INTRAVENOUS
  Filled 2015-05-07: qty 2

## 2015-05-07 NOTE — Discharge Instructions (Signed)

## 2015-05-07 NOTE — ED Notes (Signed)
PT given warm packs for hands feet. States nausea improved.

## 2015-05-07 NOTE — ED Notes (Signed)
Pt tolerated po coke and graham crackers without emesis or nausea.

## 2015-05-07 NOTE — ED Provider Notes (Signed)
CSN: [id number]     Arrival date & time 05/07/15  1021 History   First MD Initiated Contact with Patient 05/07/15 1116     Chief Complaint  Patient presents with  . Nausea  . Emesis     (Consider location/radiation/quality/duration/timing/severity/associated sxs/prior Treatment) Patient is a 81 y.o. female presenting with vomiting. The history is provided by the patient.  Emesis Severity:  Moderate Duration:  2 weeks Timing:  Constant Able to tolerate:  Liquids and solids Progression:  Worsening Chronicity:  New Recent urination:  Normal Relieved by:  Nothing Worsened by:  Nothing tried Ineffective treatments:  None tried Associated symptoms: abdominal pain (lower, crampy)   Associated symptoms: no arthralgias, no chills, no headaches and no myalgias     81 yo F with a chief complaint of nausea and vomiting. This been going on for couple weeks. Patient saw her PCP about 4 days ago and was started on some antinausea medicines. Patient has continued to have difficulty tolerating by mouth at home. States that she is than feel well and she tries to eat. Had some crampy lower abdominal pain at the onset of this. Currently not complaining of any abdominal pain. Denies fevers or chills. Denies cough congestion. Denies chest pain or shortness breath. Patient has had multiple abdominal surgeries in the past. Continues to have flatus.   Past Medical History  Diagnosis Date  . Hyperlipidemia   . PUD (peptic ulcer disease)   . B12 deficiency   . IBS (irritable bowel syndrome)   . Positive PPD   . Allergy   . Anemia   . Hx of adenomatous colonic polyps 2006  . Hepatic cyst 01/30/10  . Diverticulosis   . Esophageal stricture   . COPD (chronic obstructive pulmonary disease) ([place])   . Shortness of breath dyspnea     SOB WITH EXERTION   (NOT NEW)  . Depression   . Headache   . Arthritis   . PONV (postoperative nausea and vomiting)     no nausea or vomitting after surgery 07/2014  .  Asthma     patient denies  . Hiatal hernia 1985    patient unaware   Past Surgical History  Procedure Laterality Date  . Appendectomy    . Abdominal hysterectomy    . Vagotomy and pyloroplasty      in [place], [place]  . Breast biopsy Bilateral     [facility] glnd  . Bartholin gland cyst excision      x2  . Cataract extraction w/ intraocular lens  implant, bilateral Bilateral   . Upper gi endoscopy    . Colonoscopy w/ polypectomy    . Colonoscopy     Family History  Problem Relation Age of Onset  . Ulcers Father   . Stroke Father   . Irritable bowel syndrome Father   . Colon cancer Neg Hx   . Ovarian cancer Sister    Social History  Substance Use Topics  . Smoking status: Former Smoker -- 81 years    Quit date: 02/02/2010  . Smokeless tobacco: Never Used  . Alcohol Use: No     Comment: recovering alcoholic     37 YRS     OB History    No data available     Review of Systems  Constitutional: Negative for fever and chills.  HENT: Negative for congestion and rhinorrhea.   Eyes: Negative for redness and visual disturbance.  Respiratory: Negative for shortness of breath and wheezing.   Cardiovascular:  Negative for chest pain and palpitations.  Gastrointestinal: Positive for vomiting, abdominal pain (lower, crampy) and constipation (x2 weeks). Negative for nausea.  Genitourinary: Negative for dysuria and urgency.  Musculoskeletal: Negative for myalgias and arthralgias.  Skin: Negative for pallor and wound.  Neurological: Negative for dizziness and headaches.      Allergies  Aspirin and Ibuprofen  Home Medications   Prior to Admission medications   Medication Sig Start Date End Date Taking? Authorizing Provider  albuterol (PROVENTIL HFA;VENTOLIN HFA) 108 (90 BASE) MCG/ACT inhaler Inhale 1-2 puffs into the lungs every 6 (six) hours as needed for wheezing or shortness of breath.   Yes Historical Provider, MD  HYDROcodone-acetaminophen (NORCO) 5-325 MG per tablet Take 1  tablet by mouth every 6 (six) hours as needed for moderate pain. 02/16/15  Yes Elby Showers, MD  ondansetron (ZOFRAN) 4 MG tablet Take 1 tablet (4 mg total) by mouth every 8 (eight) hours as needed for nausea or vomiting. 05/04/15  Yes Elby Showers, MD  oxyCODONE-acetaminophen (ROXICET) 5-325 MG tablet Take 1 tablet by mouth every 6 (six) hours as needed for severe pain. 03/24/15  Yes Ashok Pall, MD  valACYclovir (VALTREX) 500 MG tablet 1 by mouth twice a day for 5 days for rash 05/04/15  Yes Elby Showers, MD  atorvastatin (LIPITOR) 10 MG tablet Take 1 tablet (10 mg total) by mouth daily. 07/03/14   Elby Showers, MD  B Complex Vitamins (B-COMPLEX/B-12 TR PO) Take 1 tablet by mouth daily.    Historical Provider, MD  cholecalciferol (VITAMIN D) 1000 UNITS tablet Take 1,000 Units by mouth daily.    Historical Provider, MD  cyclobenzaprine (FLEXERIL) 10 MG tablet Take 1 tablet (10 mg total) by mouth 3 (three) times daily as needed for muscle spasms. Patient not taking: Reported on 05/07/2015 08/04/14   Kary Kos, MD  cyclobenzaprine (FLEXERIL) 10 MG tablet Take 1 tablet (10 mg total) by mouth 3 (three) times daily as needed for muscle spasms. 03/24/15   Ashok Pall, MD  famotidine (PEPCID) 40 MG tablet Take 1 tablet (40 mg total) by mouth at bedtime as needed for heartburn. 08/25/12   Lafayette Dragon, MD  ondansetron (ZOFRAN ODT) 4 MG disintegrating tablet Take 1 tablet (4 mg total) by mouth every 8 (eight) hours as needed for nausea or vomiting. 05/07/15   Deno Etienne, DO  traMADol (ULTRAM) 50 MG tablet Take 1 tablet (50 mg total) by mouth every 6 (six) hours. 01/08/15   Elby Showers, MD  traZODone (DESYREL) 50 MG tablet Take 2 tablets (100 mg total) by mouth at bedtime. Patient taking differently: Take 50-100 mg by mouth at bedtime.  07/03/14   Elby Showers, MD  VITAMIN E PO Take 1 tablet by mouth daily.    Historical Provider, MD   BP 108/52 mmHg  Pulse 74  Temp(Src) 97.5 F (36.4 C) (Rectal)  Resp  29  SpO2 100% Physical Exam  Constitutional: She is oriented to person, place, and time. She appears well-developed and well-nourished. No distress.  HENT:  Head: Normocephalic and atraumatic.  Eyes: EOM are normal. Pupils are equal, round, and reactive to light.  Neck: Normal range of motion. Neck supple.  Cardiovascular: Normal rate and regular rhythm.  Exam reveals no gallop and no friction rub.   No murmur heard. Pulmonary/Chest: Effort normal. She has no wheezes. She has no rales.  Abdominal: Soft. She exhibits no distension. There is tenderness (bilateral lower quadrants). There is no rebound  and no guarding.  Musculoskeletal: She exhibits no edema or tenderness.  Neurological: She is alert and oriented to person, place, and time.  Skin: Skin is warm and dry. She is not diaphoretic.  Psychiatric: She has a normal mood and affect. Her behavior is normal.  Nursing note and vitals reviewed.   ED Course  Procedures (including critical care time) Labs Review Labs Reviewed  COMPREHENSIVE METABOLIC PANEL - Abnormal; Notable for the following:    Potassium 3.3 (*)    Glucose, Bld 120 (*)    BUN 31 (*)    Creatinine, Ser 1.47 (*)    ALT 12 (*)    GFR calc non Af Amer 32 (*)    GFR calc Af Amer 37 (*)    All other components within normal limits  CBC - Abnormal; Notable for the following:    Hemoglobin 10.9 (*)    HCT 35.7 (*)    MCH 24.4 (*)    Platelets 408 (*)    All other components within normal limits  URINALYSIS, ROUTINE W REFLEX MICROSCOPIC (NOT AT [facility]) - Abnormal; Notable for the following:    Specific Gravity, Urine >1.046 (*)    All other components within normal limits  I-STAT CHEM 8, ED - Abnormal; Notable for the following:    Potassium 3.2 (*)    Chloride 99 (*)    BUN 32 (*)    Creatinine, Ser 1.40 (*)    Glucose, Bld 118 (*)    All other components within normal limits  CULTURE, BLOOD (ROUTINE X 2)  URINE CULTURE  CULTURE, BLOOD (ROUTINE X 2)  LIPASE,  BLOOD  I-STAT CG4 LACTIC ACID, ED  I-STAT CG4 LACTIC ACID, ED    Imaging Review Ct Abdomen Pelvis W Contrast  05/07/2015  CLINICAL DATA:  Nausea, vomiting, diffuse abdominal pain last week EXAM: CT ABDOMEN AND PELVIS WITH CONTRAST TECHNIQUE: Multidetector CT imaging of the abdomen and pelvis was performed using the standard protocol following bolus administration of intravenous contrast. CONTRAST:  71mL OMNIPAQUE IOHEXOL 300 MG/ML  SOLN COMPARISON:  02/24/2014 FINDINGS: The lung bases are unremarkable. Post hiatal hernia repair changes noted GE junction region. Sagittal images of the spine shows posterior metallic fusion L3, [id number] and S1 level. Extensive atherosclerotic calcifications of abdominal aorta and iliac arteries. No aortic aneurysm. Enhanced liver shows no biliary ductal dilatation. Few scattered hepatic cysts are noted. The pancreas, spleen and adrenal glands are unremarkable. Kidneys are symmetrical in size and enhancement. No hydronephrosis or hydroureter. Delayed renal images shows bilateral renal symmetrical excretion. Bilateral visualized proximal ureter is unremarkable. There is no small bowel obstruction. Some liquid stool and gas noted in right colon. No pericecal inflammation. The terminal ileum is unremarkable. Moderate gas noted within cecum. The patient is status post appendectomy. Status post hysterectomy. There is no colitis or diverticulitis. The urinary bladder is unremarkable. No pelvic ascites or adenopathy No ascites or free air. No adenopathy. There is no inguinal adenopathy. IMPRESSION: 1. No acute inflammatory process within abdomen or pelvis. 2. No hydronephrosis or hydroureter. Scattered hepatic cysts are noted. 3. Atherosclerotic calcifications of abdominal aorta and iliac arteries. 4. Posterior metallic fusion 0000000 level. Postsurgical changes post hernia repair GE junction region. 5. Status post appendectomy.  Status post hysterectomy. 6. No small bowel  obstruction. Electronically Signed   By: Lahoma Crocker M.D.   On: 05/07/2015 13:51   I have personally reviewed and evaluated these images and lab results as part of my medical decision-making.  EKG Interpretation None      MDM   Final diagnoses:  Non-intractable vomiting with nausea, vomiting of unspecified type    81 yo F with a chief complaints of nausea. This been going on for couple weeks. Concern for possible bowel obstruction will obtain a CT scan with contrast. Initially with low blood pressure. Improved with 1 L of IV fluids. Mild aki.   Able to tolerate PO.  CT scan negative for acute pathology.  Patient feeling better, will d/c home.  PCP follow up in 48 hours.   I have discussed the diagnosis/risks/treatment options with the patient and believe the pt to be eligible for discharge home to follow-up with PCP. We also discussed returning to the ED immediately if new or worsening sx occur. We discussed the sx which are most concerning (e.g., sudden worsening pain, fever, inability to tolerate by mouth) that necessitate immediate return. Medications administered to the patient during their visit and any new prescriptions provided to the patient are listed below.  Medications given during this visit Medications  ondansetron (ZOFRAN) injection 4 mg (4 mg Intravenous Given 05/07/15 1150)  0.9 %  sodium chloride infusion (0 mLs Intravenous Stopped 05/07/15 1246)  sodium chloride 0.9 % bolus 1,000 mL (0 mLs Intravenous Stopped 05/07/15 1624)  iohexol (OMNIPAQUE) 300 MG/ML solution 75 mL (75 mLs Intravenous Contrast Given 05/07/15 1330)  ondansetron (ZOFRAN) injection 4 mg (4 mg Intravenous Given 05/07/15 1511)    Discharge Medication List as of 05/07/2015  3:01 PM    START taking these medications   Details  ondansetron (ZOFRAN ODT) 4 MG disintegrating tablet Take 1 tablet (4 mg total) by mouth every 8 (eight) hours as needed for nausea or vomiting., Starting 05/07/2015, Until  Discontinued, Print        The patient appears reasonably screen and/or stabilized for discharge and I doubt any other medical condition or other [facility] requiring further screening, evaluation, or treatment in the ED at this time prior to discharge.    Deno Etienne, DO 05/07/15 2016

## 2015-05-07 NOTE — Telephone Encounter (Signed)
Pt spouse called, pt not doing better, Dr. Renold Genta recommended [facility] ER to check pt.  [phone]

## 2015-05-07 NOTE — ED Notes (Signed)
Pt from home with c/o nausea with mild emesis x 1 week.  Pt reports seeing PCP who prescribed Zofran and told her to drink lots of fluids.  Pt reports the nausea has continued to prevent her from eating much or drinking.  States some "bowel pain" during initial onset but no abdominal pain.  PCP advised pt to come to ED today since it has continued.  Pt in NAD, A&O.

## 2015-05-07 NOTE — Telephone Encounter (Signed)
ED notes reviewed. Pt appears to have been volume depleted. CT scan of abdomen unremarkable. If continues to improve, we can see her next week.If symptoms recur, she must return to ED for further evaluation and not wait so long to do so.

## 2015-05-07 NOTE — Telephone Encounter (Signed)
Pt's spouse called wanting appointment for late Wednesday afternoon 05/09/15.  Explained office would be closed.  Katherine [name] would like Dr. Renold Genta to evaluate ED visit and let them know when pt should come in for follow up visit.  Best number to call is [phone]

## 2015-05-08 ENCOUNTER — Telehealth: Payer: Self-pay | Admitting: Emergency Medicine

## 2015-05-08 ENCOUNTER — Ambulatory Visit (INDEPENDENT_AMBULATORY_CARE_PROVIDER_SITE_OTHER): Payer: Medicare PPO | Admitting: Internal Medicine

## 2015-05-08 ENCOUNTER — Encounter: Payer: Self-pay | Admitting: Internal Medicine

## 2015-05-08 VITALS — BP 120/60 | Temp 97.0°F | Resp 20 | Wt 109.0 lb

## 2015-05-08 DIAGNOSIS — R7881 Bacteremia: Secondary | ICD-10-CM | POA: Diagnosis not present

## 2015-05-08 DIAGNOSIS — E860 Dehydration: Secondary | ICD-10-CM

## 2015-05-08 DIAGNOSIS — K529 Noninfective gastroenteritis and colitis, unspecified: Secondary | ICD-10-CM

## 2015-05-08 LAB — POCT URINALYSIS DIPSTICK
BILIRUBIN UA: NEGATIVE
Glucose, UA: NEGATIVE
KETONES UA: NEGATIVE
Leukocytes, UA: NEGATIVE
NITRITE UA: NEGATIVE
PH UA: 7
SPEC GRAV UA: 1.02
Urobilinogen, UA: 0.2

## 2015-05-08 LAB — URINE CULTURE: SPECIAL REQUESTS: NORMAL

## 2015-05-08 LAB — BASIC METABOLIC PANEL
BUN: 18 mg/dL (ref 7–25)
CALCIUM: 8.8 mg/dL (ref 8.6–10.4)
CO2: 24 mmol/L (ref 20–31)
CREATININE: 1.14 mg/dL — AB (ref 0.60–0.88)
Chloride: 108 mmol/L (ref 98–110)
Glucose, Bld: 78 mg/dL (ref 65–99)
Potassium: 4.1 mmol/L (ref 3.5–5.3)
Sodium: 142 mmol/L (ref 135–146)

## 2015-05-08 NOTE — Telephone Encounter (Signed)
Patient is being re-evaluated today due to need for repeat blood cultures

## 2015-05-09 LAB — CULTURE, BLOOD (ROUTINE X 2)

## 2015-05-10 ENCOUNTER — Telehealth (HOSPITAL_COMMUNITY): Payer: Self-pay

## 2015-05-10 NOTE — Telephone Encounter (Signed)
Post ED Visit - Positive Culture Follow-up  Culture report reviewed by antimicrobial stewardship pharmacist:  []  Elenor Quinones, Pharm.D. []  Heide Guile, Pharm.D., BCPS []  Parks Neptune, Pharm.D. []  Alycia Rossetti, Pharm.D., BCPS []  Sanders, Pharm.D., BCPS, AAHIVP []  Legrand Como, Pharm.D., BCPS, AAHIVP []  Milus Glazier, Pharm.D. []  Stephens November, Pharm.D. Alden Benjamin, Pharm.D.  Positive blood culture, Viridans Streptococcus 05/08/2015 Pt was advised to return to ED with symptoms worsening.   Dortha Kern 05/10/2015, 2:06 PM

## 2015-05-12 LAB — CULTURE, BLOOD (ROUTINE X 2): CULTURE: NO GROWTH

## 2015-06-05 DIAGNOSIS — H04123 Dry eye syndrome of bilateral lacrimal glands: Secondary | ICD-10-CM | POA: Diagnosis not present

## 2015-06-05 DIAGNOSIS — H16141 Punctate keratitis, right eye: Secondary | ICD-10-CM | POA: Diagnosis not present

## 2015-06-05 DIAGNOSIS — H40013 Open angle with borderline findings, low risk, bilateral: Secondary | ICD-10-CM | POA: Diagnosis not present

## 2015-06-13 ENCOUNTER — Encounter (HOSPITAL_COMMUNITY): Payer: Self-pay | Admitting: Emergency Medicine

## 2015-06-13 DIAGNOSIS — Z86010 Personal history of colonic polyps: Secondary | ICD-10-CM | POA: Insufficient documentation

## 2015-06-13 DIAGNOSIS — S9032XA Contusion of left foot, initial encounter: Secondary | ICD-10-CM | POA: Diagnosis not present

## 2015-06-13 DIAGNOSIS — Z8719 Personal history of other diseases of the digestive system: Secondary | ICD-10-CM | POA: Insufficient documentation

## 2015-06-13 DIAGNOSIS — Z8711 Personal history of peptic ulcer disease: Secondary | ICD-10-CM | POA: Insufficient documentation

## 2015-06-13 DIAGNOSIS — Z87891 Personal history of nicotine dependence: Secondary | ICD-10-CM | POA: Diagnosis not present

## 2015-06-13 DIAGNOSIS — F329 Major depressive disorder, single episode, unspecified: Secondary | ICD-10-CM | POA: Insufficient documentation

## 2015-06-13 DIAGNOSIS — W010XXA Fall on same level from slipping, tripping and stumbling without subsequent striking against object, initial encounter: Secondary | ICD-10-CM | POA: Insufficient documentation

## 2015-06-13 DIAGNOSIS — J449 Chronic obstructive pulmonary disease, unspecified: Secondary | ICD-10-CM | POA: Diagnosis not present

## 2015-06-13 DIAGNOSIS — Y92002 Bathroom of unspecified non-institutional (private) residence single-family (private) house as the place of occurrence of the external cause: Secondary | ICD-10-CM | POA: Diagnosis not present

## 2015-06-13 DIAGNOSIS — M1990 Unspecified osteoarthritis, unspecified site: Secondary | ICD-10-CM | POA: Diagnosis not present

## 2015-06-13 DIAGNOSIS — Y998 Other external cause status: Secondary | ICD-10-CM | POA: Insufficient documentation

## 2015-06-13 DIAGNOSIS — S62142A Displaced fracture of body of hamate [unciform] bone, left wrist, initial encounter for closed fracture: Secondary | ICD-10-CM | POA: Insufficient documentation

## 2015-06-13 DIAGNOSIS — S6992XA Unspecified injury of left wrist, hand and finger(s), initial encounter: Secondary | ICD-10-CM | POA: Diagnosis present

## 2015-06-13 DIAGNOSIS — D649 Anemia, unspecified: Secondary | ICD-10-CM | POA: Diagnosis not present

## 2015-06-13 DIAGNOSIS — S93602A Unspecified sprain of left foot, initial encounter: Secondary | ICD-10-CM | POA: Diagnosis not present

## 2015-06-13 DIAGNOSIS — Z79899 Other long term (current) drug therapy: Secondary | ICD-10-CM | POA: Diagnosis not present

## 2015-06-13 DIAGNOSIS — E785 Hyperlipidemia, unspecified: Secondary | ICD-10-CM | POA: Diagnosis not present

## 2015-06-13 DIAGNOSIS — Y9389 Activity, other specified: Secondary | ICD-10-CM | POA: Diagnosis not present

## 2015-06-13 DIAGNOSIS — S52622A Torus fracture of lower end of left ulna, initial encounter for closed fracture: Secondary | ICD-10-CM | POA: Diagnosis not present

## 2015-06-13 DIAGNOSIS — S52302A Unspecified fracture of shaft of left radius, initial encounter for closed fracture: Secondary | ICD-10-CM | POA: Diagnosis not present

## 2015-06-13 DIAGNOSIS — S52502A Unspecified fracture of the lower end of left radius, initial encounter for closed fracture: Secondary | ICD-10-CM | POA: Diagnosis not present

## 2015-06-13 DIAGNOSIS — S52612A Displaced fracture of left ulna styloid process, initial encounter for closed fracture: Secondary | ICD-10-CM | POA: Diagnosis not present

## 2015-06-13 NOTE — ED Notes (Signed)
Pt.sSlipped and fell in the bathroom at home this evening , denies LOC /ambulatory , presents with left wrist pain/swelling/deformity and left foot swelling /btuise. Splint applied to wrist at triage for comfort/immobilized.

## 2015-06-14 ENCOUNTER — Emergency Department (HOSPITAL_COMMUNITY)
Admission: EM | Admit: 2015-06-14 | Discharge: 2015-06-14 | Disposition: A | Discharge status: Home / Self Care | Payer: Medicare PPO | Attending: Emergency Medicine | Admitting: Emergency Medicine

## 2015-06-14 ENCOUNTER — Emergency Department (HOSPITAL_COMMUNITY): Payer: Medicare PPO

## 2015-06-14 DIAGNOSIS — S52622A Torus fracture of lower end of left ulna, initial encounter for closed fracture: Secondary | ICD-10-CM | POA: Diagnosis not present

## 2015-06-14 DIAGNOSIS — S52532A Colles' fracture of left radius, initial encounter for closed fracture: Secondary | ICD-10-CM | POA: Insufficient documentation

## 2015-06-14 DIAGNOSIS — S93602A Unspecified sprain of left foot, initial encounter: Secondary | ICD-10-CM

## 2015-06-14 DIAGNOSIS — S52302A Unspecified fracture of shaft of left radius, initial encounter for closed fracture: Secondary | ICD-10-CM | POA: Diagnosis not present

## 2015-06-14 DIAGNOSIS — S62102A Fracture of unspecified carpal bone, left wrist, initial encounter for closed fracture: Secondary | ICD-10-CM

## 2015-06-14 DIAGNOSIS — S9032XA Contusion of left foot, initial encounter: Secondary | ICD-10-CM | POA: Diagnosis not present

## 2015-06-14 MED ORDER — OXYCODONE-ACETAMINOPHEN 5-325 MG PO TABS
1.0000 | ORAL_TABLET | Freq: Once | ORAL | Status: AC
  Administered 2015-06-14: 1 via ORAL

## 2015-06-14 MED ORDER — OXYCODONE-ACETAMINOPHEN 5-325 MG PO TABS: 2.0000 | ORAL_TABLET | ORAL | Status: DC | PRN

## 2015-06-14 MED ORDER — OXYCODONE-ACETAMINOPHEN 5-325 MG PO TABS
ORAL_TABLET | ORAL | Status: AC
  Filled 2015-06-14: qty 1

## 2015-06-14 MED ORDER — OXYCODONE-ACETAMINOPHEN 5-325 MG PO TABS
1.0000 | ORAL_TABLET | Freq: Once | ORAL | Status: AC
  Administered 2015-06-14: 1 via ORAL
  Filled 2015-06-14: qty 1

## 2015-06-14 NOTE — Discharge Instructions (Signed)
Wear wrist splint as applied until followed up by orthopedics.  Percocet as prescribed as needed for pain.  Ice for 20 minutes every 2 hours while awake for the next 2 days.  Call Dr. Bertis Ruddy office to arrange a follow-up appointment. The contact information for his office has been provided in this discharge summary. Please call tomorrow to arrange this appointment.   Wrist Fracture A wrist fracture is a break or crack in one of the bones of your wrist. Your wrist is made up of eight small bones at the palm of your hand (carpal bones) and two long bones that make up your forearm (radius and ulna). CAUSES  A direct blow to the wrist.  Falling on an outstretched hand.  Trauma, such as a car accident or a fall. RISK FACTORS Risk factors for wrist fracture include:  Participating in contact and high-risk sports, such as skiing, biking, and ice skating.  Taking steroid medicines.  Smoking.  Being female.  Being Caucasian.  Drinking more than three alcoholic beverages per day.  Having low or lowered bone density (osteoporosis or osteopenia).  Age. Older adults have decreased bone density.  Women who have had menopause.  History of previous fractures. SIGNS AND SYMPTOMS Symptoms of wrist fractures include tenderness, bruising, and inflammation. Additionally, the wrist may hang in an odd position or appear deformed. DIAGNOSIS Diagnosis may include:  Physical exam.  X-ray. TREATMENT Treatment depends on many factors, including the nature and location of the fracture, your age, and your activity level. Treatment for wrist fracture can be nonsurgical or surgical. Nonsurgical Treatment A plaster cast or splint may be applied to your wrist if the bone is in a good position. If the fracture is not in good position, it may be necessary for your health care provider to realign it before applying a splint or cast. Usually, a cast or splint will be worn for several  weeks. Surgical Treatment Sometimes the position of the bone is so far out of place that surgery is required to apply a device to hold it together as it heals. Depending on the fracture, there are a number of options for holding the bone in place while it heals, such as a cast and metal pins. HOME CARE INSTRUCTIONS  Keep your injured wrist elevated and move your fingers as much as possible.  Do not put pressure on any part of your cast or splint. It may break.  Use a plastic bag to protect your cast or splint from water while bathing or showering. Do not lower your cast or splint into water.  Take medicines only as directed by your health care provider.  Keep your cast or splint clean and dry. If it becomes wet, damaged, or suddenly feels too tight, contact your health care provider right away.  Do not use any tobacco products including cigarettes, chewing tobacco, or electronic cigarettes. Tobacco can delay bone healing. If you need help quitting, ask your health care provider.  Keep all follow-up visits as directed by your health care provider. This is important.  Ask your health care provider if you should take supplements of calcium and vitamins C and D to promote bone healing. SEEK MEDICAL CARE IF:  Your cast or splint is damaged, breaks, or gets wet.  You have a fever.  You have chills.  You have continued severe pain or more swelling than you did before the cast was put on. SEEK IMMEDIATE MEDICAL CARE IF:  Your hand or fingernails on the  injured arm turn blue or gray, or feel cold or numb.  You have decreased feeling in the fingers of your injured arm. MAKE SURE YOU:  Understand these instructions.  Will watch your condition.  Will get help right away if you are not doing well or get worse.   This information is not intended to replace advice given to you by your health care provider. Make sure you discuss any questions you have with your health care provider.    Document Released: 03/12/2005 Document Revised: 02/21/2015 Document Reviewed: 06/20/2011 Elsevier Interactive Patient Education [employer].

## 2015-06-14 NOTE — Progress Notes (Signed)
Orthopedic Tech Progress Note Patient Details:  Katherine [name] [birth year] [id number] Applied fiberglass volar short arm splint to LUE.  Pulses, sensation, motion intact before and after splinting.  Capillary refill less than 2 seconds before and after splinting. Ortho Devices Type of Ortho Device: Short arm splint Ortho Device/Splint Location: LUE Ortho Device/Splint Interventions: Application   Darrol Poke 06/14/2015, 4:26 AM

## 2015-06-14 NOTE — ED Provider Notes (Signed)
CSN: [id number]     Arrival date & time 06/13/15  2334 History   First MD Initiated Contact with Patient 06/14/15 0310     Chief Complaint  Patient presents with  . Fall  . Wrist Pain     (Consider location/radiation/quality/duration/timing/severity/associated sxs/prior Treatment) HPI Comments: Patient is an 81 year old female with history of COPD, arthritis. She presents for evaluation of a fall. She tripped and fell injuring her left wrist and left foot.  Patient is a 81 y.o. female presenting with fall. The history is provided by the patient.  Fall This is a new problem. The current episode started 1 to 2 hours ago. The problem occurs constantly. The problem has not changed since onset.Exacerbated by: Movement and palpation. Nothing relieves the symptoms.    Past Medical History  Diagnosis Date  . Hyperlipidemia   . PUD (peptic ulcer disease)   . B12 deficiency   . IBS (irritable bowel syndrome)   . Positive PPD   . Allergy   . Anemia   . Hx of adenomatous colonic polyps 2006  . Hepatic cyst 01/30/10  . Diverticulosis   . Esophageal stricture   . COPD (chronic obstructive pulmonary disease) ([place])   . Shortness of breath dyspnea     SOB WITH EXERTION   (NOT NEW)  . Depression   . Headache   . Arthritis   . PONV (postoperative nausea and vomiting)     no nausea or vomitting after surgery 07/2014  . Asthma     patient denies  . Hiatal hernia 1985    patient unaware   Past Surgical History  Procedure Laterality Date  . Appendectomy    . Abdominal hysterectomy    . Vagotomy and pyloroplasty      in [place], [place]  . Breast biopsy Bilateral     Milk glnd  . Bartholin gland cyst excision      x2  . Cataract extraction w/ intraocular lens  implant, bilateral Bilateral   . Upper gi endoscopy    . Colonoscopy w/ polypectomy    . Colonoscopy     Family History  Problem Relation Age of Onset  . Ulcers Father   . Stroke Father   . Irritable bowel syndrome Father    . Colon cancer Neg Hx   . Ovarian cancer Sister    Social History  Substance Use Topics  . Smoking status: Former Smoker -- 16 years    Quit date: 02/02/2010  . Smokeless tobacco: Never Used  . Alcohol Use: No     Comment: recovering alcoholic     24 YRS     OB History    No data available     Review of Systems  All other systems reviewed and are negative.     Allergies  Aspirin and Ibuprofen  Home Medications   Prior to Admission medications   Medication Sig Start Date End Date Taking? Authorizing Provider  albuterol (PROVENTIL HFA;VENTOLIN HFA) 108 (90 BASE) MCG/ACT inhaler Inhale 1-2 puffs into the lungs every 6 (six) hours as needed for wheezing or shortness of breath.   Yes Historical Provider, MD  atorvastatin (LIPITOR) 10 MG tablet Take 1 tablet (10 mg total) by mouth daily. 07/03/14  Yes Elby Showers, MD  B Complex Vitamins (B-COMPLEX/B-12 TR PO) Take 1 tablet by mouth daily.   Yes Historical Provider, MD  cholecalciferol (VITAMIN D) 1000 UNITS tablet Take 1,000 Units by mouth daily.   Yes Historical Provider, MD  cyclobenzaprine (  FLEXERIL) 10 MG tablet Take 1 tablet (10 mg total) by mouth 3 (three) times daily as needed for muscle spasms. 03/24/15  Yes Ashok Pall, MD  famotidine (PEPCID) 40 MG tablet Take 1 tablet (40 mg total) by mouth at bedtime as needed for heartburn. 08/25/12  Yes Lafayette Dragon, MD  HYDROcodone-acetaminophen (NORCO) 5-325 MG per tablet Take 1 tablet by mouth every 6 (six) hours as needed for moderate pain. 02/16/15  Yes Elby Showers, MD  oxyCODONE-acetaminophen (ROXICET) 5-325 MG tablet Take 1 tablet by mouth every 6 (six) hours as needed for severe pain. 03/24/15  Yes Ashok Pall, MD  traMADol (ULTRAM) 50 MG tablet Take 1 tablet (50 mg total) by mouth every 6 (six) hours. 01/08/15  Yes Elby Showers, MD  traZODone (DESYREL) 50 MG tablet Take 2 tablets (100 mg total) by mouth at bedtime. Patient taking differently: Take 50-100 mg by mouth at  bedtime.  07/03/14  Yes Elby Showers, MD  VITAMIN E PO Take 1 tablet by mouth daily.   Yes Historical Provider, MD  cyclobenzaprine (FLEXERIL) 10 MG tablet Take 1 tablet (10 mg total) by mouth 3 (three) times daily as needed for muscle spasms. Patient not taking: Reported on 06/14/2015 08/04/14   Kary Kos, MD  ondansetron (ZOFRAN ODT) 4 MG disintegrating tablet Take 1 tablet (4 mg total) by mouth every 8 (eight) hours as needed for nausea or vomiting. Patient not taking: Reported on 06/14/2015 05/07/15   Deno Etienne, DO  ondansetron (ZOFRAN) 4 MG tablet Take 1 tablet (4 mg total) by mouth every 8 (eight) hours as needed for nausea or vomiting. Patient not taking: Reported on 06/14/2015 05/04/15   Elby Showers, MD  valACYclovir (VALTREX) 500 MG tablet 1 by mouth twice a day for 5 days for rash Patient not taking: Reported on 06/14/2015 05/04/15   Elby Showers, MD   BP 132/60 mmHg  Pulse 92  Temp(Src) 97.5 F (36.4 C) (Oral)  Resp 18  SpO2 100% Physical Exam  Constitutional: She is oriented to person, place, and time. She appears well-developed and well-nourished. No distress.  HENT:  Head: Normocephalic and atraumatic.  Neck: Normal range of motion. Neck supple.  Musculoskeletal:  He left wrist has swelling and mild deformity. Distal fingertips sensation, motor, and capillary refill are intact.  There is swelling and ecchymosis to the dorsum of the left foot.  Neurological: She is alert and oriented to person, place, and time.  Skin: Skin is warm and dry. She is not diaphoretic.  Nursing note and vitals reviewed.   ED Course  Procedures (including critical care time) Labs Review Labs Reviewed - No data to display  Imaging Review Dg Wrist Complete Left  06/14/2015  CLINICAL DATA:  Status post fall, with left wrist pain and swelling. Initial encounter. EXAM: LEFT WRIST - COMPLETE 3+ VIEW COMPARISON:  None. FINDINGS: There is a mildly impacted fracture of the left radial  metaphysis, with dorsal angulation. There is also a minimally displaced ulnar styloid fracture. No additional fractures are seen. The carpal rows appear grossly intact, and demonstrate normal alignment. Soft tissue swelling is noted about the wrist. IMPRESSION: Mildly impacted fracture of the left radial metaphysis, with dorsal angulation. Minimally displaced ulnar styloid fracture. Electronically Signed   By: Garald Balding M.D.   On: 06/14/2015 00:43   Dg Foot Complete Left  06/14/2015  CLINICAL DATA:  Status post fall, with left foot pain and dorsal and lateral bruising. Initial encounter. EXAM:  LEFT FOOT - COMPLETE 3+ VIEW COMPARISON:  None. FINDINGS: There is no evidence of fracture or dislocation. The joint spaces are preserved. There is no evidence of talar subluxation; the subtalar joint is unremarkable in appearance. Mild dorsal soft tissue swelling is noted at the forefoot. IMPRESSION: No evidence of fracture or dislocation. Electronically Signed   By: Garald Balding M.D.   On: 06/14/2015 00:42   I have personally reviewed and evaluated these images and lab results as part of my medical decision-making.   EKG Interpretation None      MDM   Final diagnoses:  None    X-rays reveal a left wrist fracture of the ulna and radius. X-rays of the foot are negative. She will be placed in a volar wrist splint and instructed to follow-up with hand surgery in the next 1-2 days.    Veryl Speak, MD 06/14/15 [phone]

## 2015-06-14 NOTE — ED Notes (Signed)
Ortho tech at the bedside, pt given a disposable shirt to wear home.

## 2015-06-15 DIAGNOSIS — S52572A Other intraarticular fracture of lower end of left radius, initial encounter for closed fracture: Secondary | ICD-10-CM | POA: Diagnosis not present

## 2015-06-15 DIAGNOSIS — Y999 Unspecified external cause status: Secondary | ICD-10-CM | POA: Diagnosis not present

## 2015-06-15 DIAGNOSIS — S52592A Other fractures of lower end of left radius, initial encounter for closed fracture: Secondary | ICD-10-CM | POA: Diagnosis not present

## 2015-06-15 DIAGNOSIS — Y929 Unspecified place or not applicable: Secondary | ICD-10-CM | POA: Diagnosis not present

## 2015-07-16 NOTE — Patient Instructions (Signed)
Continue to force fluids at home. Call if develops shaking chills or recurrent GI symptoms.

## 2015-07-16 NOTE — Progress Notes (Signed)
   Subjective:    Patient ID: Katherine [name], female    DOB: 11-Jun-1929, 82 y.o.   MRN: [id number]  HPI Patient went to the emergency room yesterday at my suggestion because of continued issues with gastroenteritis. Was given IV fluids. Blood cultures were taken. Apparently she was contacted today by emergency department staff indicating she had a positive blood culture. Said she had gram-positive cocci in blood culture. She is asymptomatic in terms of fever and shaking chills. Is feeling much better after IV fluids.    Review of Systems     Objective:   Physical Exam   Spent 15 minutes speaking with her about this blood culture which very well could be a contaminant.  Urine specific gravity improved from 1.046-1.020. Creatinine improved from 1.40 yesterday to 1.14.      Assessment & Plan:  Continue to monitor patient. Encourage her to force fluids for rehydration.  Addendum: 05/13/2015 blood culture from right arm grew viridans strep. Identified as gram-positive cocci in clusters initially. Blood culture from left arm had no growth after 5 days. Significance uncertain. She is asymptomatic in terms of sepsis. Not treated with antibiotics.

## 2015-07-19 ENCOUNTER — Other Ambulatory Visit: Payer: Self-pay | Admitting: Internal Medicine

## 2015-07-19 NOTE — Telephone Encounter (Signed)
Refill for 3 months. 

## 2015-07-19 NOTE — Telephone Encounter (Signed)
Phoned to pharmacy 

## 2015-07-22 ENCOUNTER — Other Ambulatory Visit: Payer: Self-pay | Admitting: Internal Medicine

## 2015-07-23 NOTE — Telephone Encounter (Signed)
Refill x 3 months 

## 2015-08-07 ENCOUNTER — Ambulatory Visit
Admission: RE | Admit: 2015-08-07 | Discharge: 2015-08-07 | Disposition: A | Discharge status: Home / Self Care | Payer: Medicare Other | Source: Ambulatory Visit | Attending: Neurosurgery | Admitting: Neurosurgery

## 2015-08-07 DIAGNOSIS — M5126 Other intervertebral disc displacement, lumbar region: Secondary | ICD-10-CM

## 2015-09-06 ENCOUNTER — Telehealth: Payer: Self-pay | Admitting: Internal Medicine

## 2015-09-06 NOTE — Telephone Encounter (Signed)
Patient's husband notified  

## 2015-09-06 NOTE — Telephone Encounter (Signed)
Take Zofran q 8 hours. NO WATER makes nausea worse. Ginger ale, sprite, coke. Soup.  Must stay hydrated or will wind up in ED like last time.

## 2015-09-06 NOTE — Telephone Encounter (Signed)
Tuesday she was vomiting and a little yesterday.  She's been nauseous.  She's been taking her Zofran that you gave her.  She's taking 4mg  every 8 hours.  It doesn't seem to be helping her.  She's still very nauseous.  She's drinking water and coke.  She hasn't tried to eat anything yet.  Husband wants to know if there's anything else for the nausea that you might suggest?  Or, do you have anything else you may recommend they do?  Not sure if this is a virus.  No diarrhea, no fever.    [name] has an appointment with Dr. Saintclair Halsted and then they will be back home.  Please advise and thanks.    [phone] (home)

## 2015-10-07 ENCOUNTER — Other Ambulatory Visit: Payer: Self-pay | Admitting: Internal Medicine

## 2015-10-10 ENCOUNTER — Other Ambulatory Visit: Payer: Self-pay

## 2015-10-10 DIAGNOSIS — Z1231 Encounter for screening mammogram for malignant neoplasm of breast: Secondary | ICD-10-CM

## 2015-10-23 ENCOUNTER — Other Ambulatory Visit: Payer: Self-pay | Admitting: Neurosurgery

## 2015-10-23 DIAGNOSIS — M5136 Other intervertebral disc degeneration, lumbar region: Secondary | ICD-10-CM

## 2015-10-26 ENCOUNTER — Ambulatory Visit
Admission: RE | Admit: 2015-10-26 | Discharge: 2015-10-26 | Disposition: A | Discharge status: Home / Self Care | Payer: Medicare Other | Source: Ambulatory Visit | Attending: Neurosurgery | Admitting: Neurosurgery

## 2015-10-26 DIAGNOSIS — M5136 Other intervertebral disc degeneration, lumbar region: Secondary | ICD-10-CM

## 2015-11-08 ENCOUNTER — Ambulatory Visit
Admission: RE | Admit: 2015-11-08 | Discharge: 2015-11-08 | Disposition: A | Discharge status: Home / Self Care | Payer: Medicare Other | Source: Ambulatory Visit

## 2015-11-08 DIAGNOSIS — Z1231 Encounter for screening mammogram for malignant neoplasm of breast: Secondary | ICD-10-CM

## 2015-11-27 ENCOUNTER — Other Ambulatory Visit: Payer: Self-pay | Admitting: Neurosurgery

## 2015-12-06 ENCOUNTER — Encounter (HOSPITAL_COMMUNITY)
Admission: RE | Admit: 2015-12-06 | Discharge: 2015-12-06 | Disposition: A | Discharge status: Home / Self Care | Payer: Medicare Other | Source: Ambulatory Visit | Attending: Neurosurgery | Admitting: Neurosurgery

## 2015-12-06 ENCOUNTER — Encounter (HOSPITAL_COMMUNITY): Payer: Self-pay

## 2015-12-06 ENCOUNTER — Other Ambulatory Visit (HOSPITAL_COMMUNITY): Payer: Self-pay | Admitting: *Deleted

## 2015-12-06 DIAGNOSIS — Z01818 Encounter for other preprocedural examination: Secondary | ICD-10-CM | POA: Diagnosis present

## 2015-12-06 DIAGNOSIS — M5137 Other intervertebral disc degeneration, lumbosacral region: Secondary | ICD-10-CM | POA: Insufficient documentation

## 2015-12-06 DIAGNOSIS — Z01812 Encounter for preprocedural laboratory examination: Secondary | ICD-10-CM | POA: Insufficient documentation

## 2015-12-06 DIAGNOSIS — R9431 Abnormal electrocardiogram [ECG] [EKG]: Secondary | ICD-10-CM | POA: Diagnosis not present

## 2015-12-06 HISTORY — DX: Alcohol dependence, uncomplicated: F10.20

## 2015-12-06 LAB — COMPREHENSIVE METABOLIC PANEL
ALBUMIN: 3.6 g/dL (ref 3.5–5.0)
ALT: 15 U/L (ref 14–54)
AST: 29 U/L (ref 15–41)
Alkaline Phosphatase: 80 U/L (ref 38–126)
Anion gap: 6 (ref 5–15)
BILIRUBIN TOTAL: 0.4 mg/dL (ref 0.3–1.2)
BUN: 17 mg/dL (ref 6–20)
CALCIUM: 9.5 mg/dL (ref 8.9–10.3)
CO2: 27 mmol/L (ref 22–32)
Chloride: 106 mmol/L (ref 101–111)
Creatinine, Ser: 1.03 mg/dL — ABNORMAL HIGH (ref 0.44–1.00)
GFR calc Af Amer: 56 mL/min — ABNORMAL LOW (ref >60)
GFR calc non Af Amer: 48 mL/min — ABNORMAL LOW (ref >60)
GLUCOSE: 108 mg/dL — AB (ref 65–99)
POTASSIUM: 3.6 mmol/L (ref 3.5–5.1)
Sodium: 139 mmol/L (ref 135–145)
TOTAL PROTEIN: 6.6 g/dL (ref 6.5–8.1)

## 2015-12-06 LAB — SURGICAL PCR SCREEN
MRSA, PCR: NEGATIVE
Staphylococcus aureus: NEGATIVE

## 2015-12-06 LAB — CBC
HCT: 35.4 % — ABNORMAL LOW (ref 36.0–46.0)
HEMOGLOBIN: 10.7 g/dL — AB (ref 12.0–15.0)
MCH: 25.1 pg — AB (ref 26.0–34.0)
MCHC: 30.2 g/dL (ref 30.0–36.0)
MCV: 83.1 fL (ref 78.0–100.0)
Platelets: 233 10*3/uL (ref 150–400)
RBC: 4.26 MIL/uL (ref 3.87–5.11)
RDW: 16.3 % — ABNORMAL HIGH (ref 11.5–15.5)
WBC: 6.6 10*3/uL (ref 4.0–10.5)

## 2015-12-06 NOTE — Progress Notes (Signed)
PCP Tedra Senegal MD  Denies any cardiac history or testing. EKG to be done today SOB only with exertion. No cough or wheezing.

## 2015-12-06 NOTE — Pre-Procedure Instructions (Signed)
    Mishon Getting Begay  12/06/2015      [id number] - [place], [place] - [address] [place] [address] Phone: [phone] Fax: [phone]    Your procedure is scheduled on Thursday June 29th  Report to [facility] Admitting at 1:30 pm  Call this number if you have problems the morning of surgery:  [phone]   Remember:  Do not eat food or drink liquids after midnight.  Take these medicines the morning of surgery with A SIP OF WATER albuterol inhaler (bring to hospital), pepcid (famotidine), oxycodone if needed Stop taking your vitamin E). Do not take any aspirin products or anti-inflammatories (ibuprofen, motrin, aleve, naproxen, advil)   Do not wear jewelry, make-up or nail polish.  Do not wear lotions, powders, or perfumes.  You may not wear deoderant.  Do not shave 48 hours prior to surgery.    Do not bring valuables to the hospital.  [facility] is not responsible for any belongings or valuables.  Contacts, dentures or bridgework may not be worn into surgery.  Leave your suitcase in the car.  After surgery it may be brought to your room.    Special instructions: Shower with CHG the night before and morning of surgery as instructed  Please read over the following fact sheets that you were given. Coughing and Deep Breathing, MRSA Information and Surgical Site Infection Prevention, shower instructions

## 2015-12-12 MED ORDER — CEFAZOLIN SODIUM-DEXTROSE 2-4 GM/100ML-% IV SOLN
2.0000 g | INTRAVENOUS | Status: AC
  Administered 2015-12-13: 2 g via INTRAVENOUS
  Filled 2015-12-12: qty 100

## 2015-12-13 ENCOUNTER — Inpatient Hospital Stay (HOSPITAL_COMMUNITY): Payer: Medicare Other | Admitting: Anesthesiology

## 2015-12-13 ENCOUNTER — Inpatient Hospital Stay (HOSPITAL_COMMUNITY)
Admission: RE | Admit: 2015-12-13 | Discharge: 2015-12-14 | DRG: 517 | Disposition: A | Discharge status: Home / Self Care | Payer: Medicare Other | Source: Ambulatory Visit | Attending: Neurosurgery | Admitting: Neurosurgery

## 2015-12-13 ENCOUNTER — Encounter (HOSPITAL_COMMUNITY): Payer: Self-pay | Admitting: Anesthesiology

## 2015-12-13 ENCOUNTER — Encounter (HOSPITAL_COMMUNITY)
Admission: RE | Disposition: A | Discharge status: Home / Self Care | Payer: Self-pay | Source: Ambulatory Visit | Attending: Neurosurgery

## 2015-12-13 DIAGNOSIS — Z87891 Personal history of nicotine dependence: Secondary | ICD-10-CM

## 2015-12-13 DIAGNOSIS — T8484XA Pain due to internal orthopedic prosthetic devices, implants and grafts, initial encounter: Principal | ICD-10-CM | POA: Diagnosis present

## 2015-12-13 DIAGNOSIS — D649 Anemia, unspecified: Secondary | ICD-10-CM | POA: Diagnosis present

## 2015-12-13 DIAGNOSIS — T84038A Mechanical loosening of other internal prosthetic joint, initial encounter: Secondary | ICD-10-CM | POA: Diagnosis present

## 2015-12-13 DIAGNOSIS — K219 Gastro-esophageal reflux disease without esophagitis: Secondary | ICD-10-CM | POA: Diagnosis present

## 2015-12-13 DIAGNOSIS — E785 Hyperlipidemia, unspecified: Secondary | ICD-10-CM | POA: Diagnosis present

## 2015-12-13 DIAGNOSIS — J449 Chronic obstructive pulmonary disease, unspecified: Secondary | ICD-10-CM | POA: Diagnosis present

## 2015-12-13 DIAGNOSIS — Y831 Surgical operation with implant of artificial internal device as the cause of abnormal reaction of the patient, or of later complication, without mention of misadventure at the time of the procedure: Secondary | ICD-10-CM | POA: Diagnosis present

## 2015-12-13 DIAGNOSIS — Z8711 Personal history of peptic ulcer disease: Secondary | ICD-10-CM | POA: Diagnosis not present

## 2015-12-13 DIAGNOSIS — Z981 Arthrodesis status: Secondary | ICD-10-CM

## 2015-12-13 DIAGNOSIS — M549 Dorsalgia, unspecified: Secondary | ICD-10-CM | POA: Diagnosis present

## 2015-12-13 HISTORY — PX: HARDWARE REMOVAL: SHX979

## 2015-12-13 SURGERY — REMOVAL, HARDWARE
Anesthesia: General | Site: Back

## 2015-12-13 MED ORDER — ACETAMINOPHEN 325 MG PO TABS: 650.0000 mg | ORAL_TABLET | ORAL | Status: DC | PRN

## 2015-12-13 MED ORDER — PROPOFOL 10 MG/ML IV BOLUS
INTRAVENOUS | Status: DC | PRN
  Administered 2015-12-13: 120 mg via INTRAVENOUS

## 2015-12-13 MED ORDER — ONDANSETRON HCL 4 MG/2ML IJ SOLN
INTRAMUSCULAR | Status: DC | PRN
  Administered 2015-12-13: 4 mg via INTRAVENOUS

## 2015-12-13 MED ORDER — VITAMIN D 1000 UNITS PO TABS: 1000.0000 [IU] | ORAL_TABLET | Freq: Every day | ORAL | Status: DC

## 2015-12-13 MED ORDER — SODIUM CHLORIDE 0.9 % IV SOLN: 250.0000 mL | INTRAVENOUS | Status: DC

## 2015-12-13 MED ORDER — SODIUM CHLORIDE 0.9% FLUSH: 3.0000 mL | INTRAVENOUS | Status: DC | PRN

## 2015-12-13 MED ORDER — TRAZODONE HCL 50 MG PO TABS
50.0000 mg | ORAL_TABLET | Freq: Every day | ORAL | Status: DC
  Administered 2015-12-13: 50 mg via ORAL
  Filled 2015-12-13 (×2): qty 1

## 2015-12-13 MED ORDER — SODIUM CHLORIDE 0.9 % IR SOLN
Status: DC | PRN
  Administered 2015-12-13: 500 mL

## 2015-12-13 MED ORDER — ONDANSETRON HCL 4 MG/2ML IJ SOLN
INTRAMUSCULAR | Status: AC
  Filled 2015-12-13: qty 2

## 2015-12-13 MED ORDER — FENTANYL CITRATE (PF) 250 MCG/5ML IJ SOLN
INTRAMUSCULAR | Status: AC
  Filled 2015-12-13: qty 5

## 2015-12-13 MED ORDER — DEXAMETHASONE SODIUM PHOSPHATE 10 MG/ML IJ SOLN: 10.0000 mg | INTRAMUSCULAR | Status: DC

## 2015-12-13 MED ORDER — BUPIVACAINE HCL (PF) 0.25 % IJ SOLN
INTRAMUSCULAR | Status: DC | PRN
  Administered 2015-12-13: 10 mL

## 2015-12-13 MED ORDER — ACETAMINOPHEN 650 MG RE SUPP: 650.0000 mg | RECTAL | Status: DC | PRN

## 2015-12-13 MED ORDER — THROMBIN 5000 UNITS EX SOLR
CUTANEOUS | Status: DC | PRN
  Administered 2015-12-13 (×2): 5000 [IU] via TOPICAL

## 2015-12-13 MED ORDER — HYDROMORPHONE HCL 1 MG/ML IJ SOLN: 0.2500 mg | INTRAMUSCULAR | Status: DC | PRN

## 2015-12-13 MED ORDER — FAMOTIDINE 20 MG PO TABS: 40.0000 mg | ORAL_TABLET | Freq: Every evening | ORAL | Status: DC | PRN

## 2015-12-13 MED ORDER — LIDOCAINE HCL (CARDIAC) 20 MG/ML IV SOLN
INTRAVENOUS | Status: DC | PRN
  Administered 2015-12-13: 60 mg via INTRAVENOUS

## 2015-12-13 MED ORDER — 0.9 % SODIUM CHLORIDE (POUR BTL) OPTIME
TOPICAL | Status: DC | PRN
  Administered 2015-12-13: 1000 mL

## 2015-12-13 MED ORDER — OXYCODONE HCL 5 MG PO TABS
5.0000 mg | ORAL_TABLET | Freq: Two times a day (BID) | ORAL | Status: DC | PRN
  Administered 2015-12-14: 5 mg via ORAL
  Filled 2015-12-13: qty 1

## 2015-12-13 MED ORDER — PROPOFOL 10 MG/ML IV BOLUS
INTRAVENOUS | Status: AC
  Filled 2015-12-13: qty 20

## 2015-12-13 MED ORDER — CEFAZOLIN SODIUM-DEXTROSE 2-4 GM/100ML-% IV SOLN
2.0000 g | Freq: Three times a day (TID) | INTRAVENOUS | Status: DC
  Administered 2015-12-13: 2 g via INTRAVENOUS
  Filled 2015-12-13: qty 100

## 2015-12-13 MED ORDER — DEXAMETHASONE SODIUM PHOSPHATE 10 MG/ML IJ SOLN
INTRAMUSCULAR | Status: AC
  Administered 2015-12-13: 10 mg via INTRAVENOUS
  Filled 2015-12-13: qty 1

## 2015-12-13 MED ORDER — HYDROMORPHONE HCL 1 MG/ML IJ SOLN: 0.5000 mg | INTRAMUSCULAR | Status: DC | PRN

## 2015-12-13 MED ORDER — HEMOSTATIC AGENTS (NO CHARGE) OPTIME
TOPICAL | Status: DC | PRN
  Administered 2015-12-13: 1 via TOPICAL

## 2015-12-13 MED ORDER — MENTHOL 3 MG MT LOZG: 1.0000 | LOZENGE | OROMUCOSAL | Status: DC | PRN

## 2015-12-13 MED ORDER — CYCLOBENZAPRINE HCL 10 MG PO TABS
10.0000 mg | ORAL_TABLET | Freq: Three times a day (TID) | ORAL | Status: DC | PRN
  Administered 2015-12-14: 10 mg via ORAL
  Filled 2015-12-13: qty 1

## 2015-12-13 MED ORDER — PHENOL 1.4 % MT LIQD: 1.0000 | OROMUCOSAL | Status: DC | PRN

## 2015-12-13 MED ORDER — ONDANSETRON HCL 4 MG/2ML IJ SOLN: 4.0000 mg | INTRAMUSCULAR | Status: DC | PRN

## 2015-12-13 MED ORDER — PHENYLEPHRINE HCL 10 MG/ML IJ SOLN
10.0000 mg | INTRAVENOUS | Status: DC | PRN
  Administered 2015-12-13: 50 ug/min via INTRAVENOUS

## 2015-12-13 MED ORDER — LIDOCAINE-EPINEPHRINE 1 %-1:100000 IJ SOLN
INTRAMUSCULAR | Status: DC | PRN
  Administered 2015-12-13 (×2): 10 mL

## 2015-12-13 MED ORDER — MEPERIDINE HCL 25 MG/ML IJ SOLN: 6.2500 mg | INTRAMUSCULAR | Status: DC | PRN

## 2015-12-13 MED ORDER — SUGAMMADEX SODIUM 200 MG/2ML IV SOLN
INTRAVENOUS | Status: AC
  Filled 2015-12-13: qty 2

## 2015-12-13 MED ORDER — SUGAMMADEX SODIUM 200 MG/2ML IV SOLN
INTRAVENOUS | Status: DC | PRN
  Administered 2015-12-13: 200 mg via INTRAVENOUS

## 2015-12-13 MED ORDER — PROMETHAZINE HCL 25 MG/ML IJ SOLN: 6.2500 mg | INTRAMUSCULAR | Status: DC | PRN

## 2015-12-13 MED ORDER — ROCURONIUM BROMIDE 100 MG/10ML IV SOLN
INTRAVENOUS | Status: DC | PRN
  Administered 2015-12-13: 40 mg via INTRAVENOUS

## 2015-12-13 MED ORDER — MIDAZOLAM HCL 2 MG/2ML IJ SOLN
INTRAMUSCULAR | Status: AC
  Filled 2015-12-13: qty 2

## 2015-12-13 MED ORDER — MIDAZOLAM HCL 5 MG/5ML IJ SOLN
INTRAMUSCULAR | Status: DC | PRN
  Administered 2015-12-13: 1 mg via INTRAVENOUS

## 2015-12-13 MED ORDER — FENTANYL CITRATE (PF) 100 MCG/2ML IJ SOLN
INTRAMUSCULAR | Status: DC | PRN
  Administered 2015-12-13 (×4): 50 ug via INTRAVENOUS

## 2015-12-13 MED ORDER — ATORVASTATIN CALCIUM 20 MG PO TABS
10.0000 mg | ORAL_TABLET | Freq: Every day | ORAL | Status: DC
  Administered 2015-12-13: 10 mg via ORAL
  Filled 2015-12-13: qty 1

## 2015-12-13 MED ORDER — LACTATED RINGERS IV SOLN
INTRAVENOUS | Status: DC
  Administered 2015-12-13 (×3): via INTRAVENOUS

## 2015-12-13 MED ORDER — VILAZODONE HCL 40 MG PO TABS
40.0000 mg | ORAL_TABLET | Freq: Every day | ORAL | Status: DC | PRN
  Filled 2015-12-13: qty 1

## 2015-12-13 MED ORDER — OXYCODONE-ACETAMINOPHEN 5-325 MG PO TABS
1.0000 | ORAL_TABLET | ORAL | Status: DC | PRN
  Administered 2015-12-13: 2 via ORAL
  Administered 2015-12-14: 1 via ORAL
  Filled 2015-12-13: qty 2
  Filled 2015-12-13: qty 1

## 2015-12-13 MED ORDER — SODIUM CHLORIDE 0.9% FLUSH: 3.0000 mL | Freq: Two times a day (BID) | INTRAVENOUS | Status: DC

## 2015-12-13 MED ORDER — ALBUTEROL SULFATE (2.5 MG/3ML) 0.083% IN NEBU: 2.5000 mg | INHALATION_SOLUTION | Freq: Four times a day (QID) | RESPIRATORY_TRACT | Status: DC | PRN

## 2015-12-13 MED ORDER — OLOPATADINE HCL 0.1 % OP SOLN
1.0000 [drp] | Freq: Two times a day (BID) | OPHTHALMIC | Status: DC
  Filled 2015-12-13: qty 5

## 2015-12-13 SURGICAL SUPPLY — 53 items
APL SKNCLS STERI-STRIP NONHPOA (GAUZE/BANDAGES/DRESSINGS) ×1
BAG DECANTER FOR FLEXI CONT (MISCELLANEOUS) ×3 IMPLANT
BENZOIN TINCTURE PRP APPL 2/3 (GAUZE/BANDAGES/DRESSINGS) ×3 IMPLANT
BLADE CLIPPER SURG (BLADE) IMPLANT
BRUSH SCRUB EZ PLAIN DRY (MISCELLANEOUS) ×3 IMPLANT
BUR MATCHSTICK NEURO 3.0 LAGG (BURR) ×3 IMPLANT
BUR PRECISION FLUTE 6.0 (BURR) IMPLANT
CANISTER SUCT 3000ML PPV (MISCELLANEOUS) IMPLANT
CLOSURE WOUND 1/2 X4 (GAUZE/BANDAGES/DRESSINGS) ×1
CONT SPEC 4OZ CLIKSEAL STRL BL (MISCELLANEOUS) ×2 IMPLANT
DECANTER SPIKE VIAL GLASS SM (MISCELLANEOUS) ×3 IMPLANT
DRAPE LAPAROTOMY 100X72X124 (DRAPES) ×3 IMPLANT
DRAPE POUCH INSTRU U-SHP 10X18 (DRAPES) ×3 IMPLANT
DRAPE PROXIMA HALF (DRAPES) IMPLANT
DRAPE SURG 17X23 STRL (DRAPES) ×3 IMPLANT
DRSG OPSITE POSTOP 4X6 (GAUZE/BANDAGES/DRESSINGS) ×2 IMPLANT
ELECT REM PT RETURN 9FT ADLT (ELECTROSURGICAL) ×3
ELECTRODE REM PT RTRN 9FT ADLT (ELECTROSURGICAL) ×1 IMPLANT
GAUZE SPONGE 4X4 12PLY STRL (GAUZE/BANDAGES/DRESSINGS) ×3 IMPLANT
GAUZE SPONGE 4X4 16PLY XRAY LF (GAUZE/BANDAGES/DRESSINGS) IMPLANT
GLOVE BIO SURGEON STRL SZ 6.5 (GLOVE) ×2 IMPLANT
GLOVE BIO SURGEON STRL SZ8 (GLOVE) ×3 IMPLANT
GLOVE BIO SURGEONS STRL SZ 6.5 (GLOVE) ×2
GLOVE BIOGEL PI IND STRL 7.0 (GLOVE) IMPLANT
GLOVE BIOGEL PI INDICATOR 7.0 (GLOVE) ×4
GLOVE EXAM NITRILE LRG STRL (GLOVE) IMPLANT
GLOVE EXAM NITRILE MD LF STRL (GLOVE) IMPLANT
GLOVE EXAM NITRILE XL STR (GLOVE) IMPLANT
GLOVE EXAM NITRILE XS STR PU (GLOVE) IMPLANT
GLOVE INDICATOR 8.5 STRL (GLOVE) ×3 IMPLANT
GOWN STRL REUS W/ TWL LRG LVL3 (GOWN DISPOSABLE) ×1 IMPLANT
GOWN STRL REUS W/ TWL XL LVL3 (GOWN DISPOSABLE) ×2 IMPLANT
GOWN STRL REUS W/TWL 2XL LVL3 (GOWN DISPOSABLE) IMPLANT
GOWN STRL REUS W/TWL LRG LVL3 (GOWN DISPOSABLE) ×3
GOWN STRL REUS W/TWL XL LVL3 (GOWN DISPOSABLE) ×6
KIT BASIN OR (CUSTOM PROCEDURE TRAY) ×3 IMPLANT
KIT ROOM TURNOVER OR (KITS) ×3 IMPLANT
LIQUID BAND (GAUZE/BANDAGES/DRESSINGS) ×3 IMPLANT
NDL SPNL 22GX3.5 QUINCKE BK (NEEDLE) ×1 IMPLANT
NEEDLE HYPO 22GX1.5 SAFETY (NEEDLE) ×3 IMPLANT
NEEDLE SPNL 22GX3.5 QUINCKE BK (NEEDLE) ×3 IMPLANT
NS IRRIG 1000ML POUR BTL (IV SOLUTION) ×3 IMPLANT
PACK LAMINECTOMY NEURO (CUSTOM PROCEDURE TRAY) ×3 IMPLANT
RASP 3.0MM (RASP) ×2 IMPLANT
SPONGE SURGIFOAM ABS GEL SZ50 (HEMOSTASIS) ×3 IMPLANT
STRIP CLOSURE SKIN 1/2X4 (GAUZE/BANDAGES/DRESSINGS) ×2 IMPLANT
SUT VIC AB 0 CT1 18XCR BRD8 (SUTURE) ×1 IMPLANT
SUT VIC AB 0 CT1 8-18 (SUTURE) ×3
SUT VIC AB 2-0 CT1 18 (SUTURE) ×3 IMPLANT
SUT VICRYL 4-0 PS2 18IN ABS (SUTURE) ×3 IMPLANT
TOWEL OR 17X24 6PK STRL BLUE (TOWEL DISPOSABLE) ×3 IMPLANT
TOWEL OR 17X26 10 PK STRL BLUE (TOWEL DISPOSABLE) ×3 IMPLANT
WATER STERILE IRR 1000ML POUR (IV SOLUTION) ×3 IMPLANT

## 2015-12-13 NOTE — Anesthesia Postprocedure Evaluation (Signed)
Anesthesia Post Note  Patient: Katherine [name]  Procedure(s) Performed: Procedure(s) (LRB): Removal of HARDWARE  Lumbar Five-Sacral One  (N/A)  Patient location during evaluation: PACU Anesthesia Type: General Level of consciousness: awake and alert Pain management: pain level controlled Vital Signs Assessment: post-procedure vital signs reviewed and stable Respiratory status: spontaneous breathing, nonlabored ventilation, respiratory function stable and patient connected to nasal cannula oxygen Cardiovascular status: blood pressure returned to baseline and stable Postop Assessment: no signs of nausea or vomiting Anesthetic complications: no    Last Vitals:  Filed Vitals:   12/13/15 1745 12/13/15 1747  BP:  128/49  Pulse: 77 80  Temp:    Resp: 18 15    Last Pain:  Filed Vitals:   12/13/15 1756  PainSc: 0-No pain                 [name] DAVID

## 2015-12-13 NOTE — Anesthesia Procedure Notes (Signed)
Procedure Name: Intubation Date/Time: 12/13/2015 4:23 PM Performed by: Rebekah Chesterfield L Pre-anesthesia Checklist: Patient identified, Emergency Drugs available, Suction available and Patient being monitored Patient Re-evaluated:Patient Re-evaluated prior to inductionOxygen Delivery Method: Circle System Utilized Preoxygenation: Pre-oxygenation with 100% oxygen Intubation Type: IV induction Ventilation: Mask ventilation without difficulty Laryngoscope Size: Mac and 3 Tube type: Oral Tube size: 7.0 mm Number of attempts: 1 Airway Equipment and Method: Stylet Placement Confirmation: ETT inserted through vocal cords under direct vision,  positive ETCO2 and breath sounds checked- equal and bilateral Secured at: 20 cm Tube secured with: Tape Dental Injury: Teeth and Oropharynx as per pre-operative assessment

## 2015-12-13 NOTE — Transfer of Care (Signed)
Immediate Anesthesia Transfer of Care Note  Patient: Katherine [name]  Procedure(s) Performed: Procedure(s): Removal of HARDWARE  Lumbar Five-Sacral One  (N/A)  Patient Location: PACU  Anesthesia Type:General  Level of Consciousness: awake, alert  and oriented  Airway & Oxygen Therapy: Patient Spontanous Breathing and Patient connected to nasal cannula oxygen  Post-op Assessment: Report given to RN, Post -op Vital signs reviewed and stable, Patient moving all extremities and Patient moving all extremities X 4  Post vital signs: Reviewed and stable  Last Vitals:  Filed Vitals:   12/13/15 1310  BP: 130/46  Pulse: 84  Temp: 36.6 C  Resp: 20    Last Pain:  Filed Vitals:   12/13/15 1314  PainSc: 4          Complications: No apparent anesthesia complications

## 2015-12-13 NOTE — Op Note (Signed)
Preoperative diagnosis: Painful hardware  Postoperative diagnosis: Same  Procedure: Exploration of fusion removal of hardware L4-S1  Surgeon: Dominica Severin Destinae Neubecker  Anesthesia: Gen.  EBL: Mental  History of present illness: Patient is very pleasant 82 year female has previously undergone L3 S1 fusion developed a pseudoarthrosis and revision posterior lateral fusion did apparently have what appears to be progressive fusion posterior laterally however does have a progress worsening loosening of her cortical screws at L5 and S1 and due to what appears to be a fusion that is progressing but loose hardware I recommended hardware removal only. I extensively went over the risks and benefits of the operation the patient as well as perioperative course expectations of outcome and alternatives of surgery and she understands and agrees to proceed forward.  Operative procedure: Patient brought in the or was induced on general anesthesia positioned prone the Wilson frame her back was prepped and draped in routine sterile fashion the inferior two thirds of her old incision was opened up and scar tissues dissected free exposing the cortical screws and hardware from L4-S1. Using utilizing a metalcutting drill bit I cut the rods between L4 and L5 removed the nuts and removed the screws at L5 and S1 and they were markedly loose. The fusion however did appear to be solid. I irrigated the wound copiously and then closed in layers with after Vicryl and a running 4 subcuticular in the skin Dermabond benzo and Steri-Strips and sterile dressing was applied patient recovered in stable condition. At the end of case all needle counts sponge counts were correct.

## 2015-12-13 NOTE — Anesthesia Preprocedure Evaluation (Addendum)
Anesthesia Evaluation  Patient identified by MRN, date of birth, ID band Patient awake    Reviewed: Allergy & Precautions, NPO status , Patient's Chart, lab work & pertinent test results  Airway Mallampati: II  TM Distance: >3 FB Neck ROM: Full    Dental no notable dental hx.    Pulmonary shortness of breath, asthma , COPD, former smoker,    Pulmonary exam normal breath sounds clear to auscultation       Cardiovascular negative cardio ROS Normal cardiovascular exam Rhythm:Regular Rate:Normal     Neuro/Psych  Headaches, PSYCHIATRIC DISORDERS Depression    GI/Hepatic Neg liver ROS, hiatal hernia, PUD, GERD  ,  Endo/Other  negative endocrine ROS  Renal/GU negative Renal ROS     Musculoskeletal  (+) Arthritis ,   Abdominal   Peds  Hematology  (+) anemia ,   Anesthesia Other Findings   Reproductive/Obstetrics negative OB ROS                            Anesthesia Physical Anesthesia Plan  ASA: III  Anesthesia Plan: General   Post-op Pain Management:    Induction: Intravenous  Airway Management Planned: Oral ETT  Additional Equipment:   Intra-op Plan:   Post-operative Plan: Extubation in OR  Informed Consent: I have reviewed the patients History and Physical, chart, labs and discussed the procedure including the risks, benefits and alternatives for the proposed anesthesia with the patient or authorized representative who has indicated his/her understanding and acceptance.   Dental advisory given  Plan Discussed with: CRNA  Anesthesia Plan Comments:         Anesthesia Quick Evaluation                                  Anesthesia Evaluation  Patient identified by MRN, date of birth, ID band Patient awake    Reviewed: Allergy & Precautions, NPO status , Patient's Chart, lab work & pertinent test results  History of Anesthesia Complications (+) PONV and history of  anesthetic complications  Airway Mallampati: II  TM Distance: >3 FB Neck ROM: Full    Dental   Pulmonary shortness of breath, asthma , COPD, former smoker,    breath sounds clear to auscultation       Cardiovascular negative cardio ROS   Rhythm:Regular     Neuro/Psych  Headaches, Depression    GI/Hepatic Neg liver ROS, hiatal hernia, PUD, GERD  ,  Endo/Other    Renal/GU negative Renal ROS     Musculoskeletal  (+) Arthritis ,   Abdominal   Peds  Hematology  (+) anemia ,   Anesthesia Other Findings Will consider 1 time dose of ketamine (0.5mg /kg) with induction to help with pain control, GA with oral ETT  Reproductive/Obstetrics                           Anesthesia Physical  Anesthesia Plan  ASA: III  Anesthesia Plan: General   Post-op Pain Management:    Induction: Intravenous  Airway Management Planned: Oral ETT  Additional Equipment:   Intra-op Plan:   Post-operative Plan: Possible Post-op intubation/ventilation  Informed Consent: I have reviewed the patients History and Physical, chart, labs and discussed the procedure including the risks, benefits and alternatives for the proposed anesthesia with the patient or authorized representative who has indicated his/her understanding and acceptance.  Dental advisory given  Plan Discussed with: CRNA, Anesthesiologist and Surgeon  Anesthesia Plan Comments:         Anesthesia Quick Evaluation

## 2015-12-13 NOTE — H&P (Signed)
Katherine [name] is an 82 y.o. female.   Chief Complaint: Back pain HPI: Patient is very pleasant 82 year female previous L3-S1 fusion and initially did very well developed a pseudoarthrosis and was revised for pseudarthrosis and still shows evidence of loosening of her S1 screws there appears to be progressive fusion having posterior laterally. However the pain has gotten progressive worse and is all mechanical I think she is toggling against those loose screws. Because of this of recommended exploration fusion with removal of hardware. I have extensively gone over the risks and benefits of the operation with the patient as well as perioperative course expectations of outcome and alternatives of surgery and she understands and agrees to proceed forward.  Past Medical History  Diagnosis Date  . Hyperlipidemia   . PUD (peptic ulcer disease)   . B12 deficiency   . IBS (irritable bowel syndrome)   . Positive PPD   . Allergy   . Anemia   . Hx of adenomatous colonic polyps 2006  . Hepatic cyst 01/30/10  . Diverticulosis   . Esophageal stricture   . COPD (chronic obstructive pulmonary disease) ([place])   . Arthritis   . PONV (postoperative nausea and vomiting)     no nausea or vomitting after surgery 07/2014  . Asthma     patient denies  . Hiatal hernia 1985    patient unaware  . Shortness of breath dyspnea     SOB WITH EXERTION   (NOT NEW)  . Depression     not recent  . Headache     cluster headaches occasionally  . Alcoholic ([place])     36 years sober    Past Surgical History  Procedure Laterality Date  . Appendectomy    . Abdominal hysterectomy    . Vagotomy and pyloroplasty      in [place], [place]  . Breast biopsy Bilateral     Milk glnd  . Bartholin gland cyst excision      x2  . Cataract extraction w/ intraocular lens  implant, bilateral Bilateral   . Upper gi endoscopy    . Colonoscopy w/ polypectomy    . Colonoscopy      Family History  Problem Relation Age of Onset   . Ulcers Father   . Stroke Father   . Irritable bowel syndrome Father   . Colon cancer Neg Hx   . Ovarian cancer Sister    Social History:  reports that she quit smoking about 5 years ago. She has never used smokeless tobacco. She reports that she does not drink alcohol or use illicit drugs.  Allergies:  Allergies  Allergen Reactions  . Aspirin Other (See Comments)    Hx of ulcers  . Ibuprofen Nausea Only    Medications Prior to Admission  Medication Sig Dispense Refill  . albuterol (PROVENTIL HFA;VENTOLIN HFA) 108 (90 BASE) MCG/ACT inhaler Inhale 1-2 puffs into the lungs every 6 (six) hours as needed for wheezing or shortness of breath.    Marland Kitchen atorvastatin (LIPITOR) 10 MG tablet TAKE 1 TABLET (10 MG TOTAL) BY MOUTH DAILY. 90 tablet 3  . B Complex Vitamins (B-COMPLEX/B-12 TR PO) Take 1 tablet by mouth daily.    . cholecalciferol (VITAMIN D) 1000 UNITS tablet Take 1,000 Units by mouth daily.    . famotidine (PEPCID) 40 MG tablet Take 1 tablet (40 mg total) by mouth at bedtime as needed for heartburn. 30 tablet 3  . oxyCODONE (OXY IR/ROXICODONE) 5 MG immediate release tablet Take  5 mg by mouth 2 (two) times daily as needed for moderate pain.   0  . PATADAY 0.2 % SOLN Place 1 drop into both eyes daily as needed (for dry, allergy eyes).   4  . traZODone (DESYREL) 50 MG tablet TAKE 2 TABLETS (100 MG TOTAL) BY MOUTH AT BEDTIME. 180 tablet 3  . Vilazodone HCl (VIIBRYD) 40 MG TABS Take 40 mg by mouth daily as needed (for depression).     Marland Kitchen VITAMIN E PO Take 1 tablet by mouth daily.      No results found for this or any previous visit (from the past 48 hour(s)). No results found.  Review of Systems  Constitutional: Negative.   HENT: Negative.   Eyes: Negative.   Respiratory: Negative.   Cardiovascular: Negative.   Gastrointestinal: Negative.   Genitourinary: Negative.   Musculoskeletal: Positive for myalgias and back pain.  Skin: Negative.   Neurological: Negative.    Psychiatric/Behavioral: Negative.     Blood pressure 130/46, pulse 84, temperature 97.8 F (36.6 C), temperature source Oral, resp. rate 20, height 5\' 1"  (1.549 m), weight 49.125 kg (108 lb 4.8 oz), SpO2 100 %. Physical Exam  Constitutional: She is oriented to person, place, and time. She appears well-developed and well-nourished.  HENT:  Head: Normocephalic.  Eyes: Pupils are equal, round, and reactive to light.  Neck: Normal range of motion.  Respiratory: Effort normal.  GI: Soft. Bowel sounds are normal.  Musculoskeletal: Normal range of motion.  Neurological: She is alert and oriented to person, place, and time. She has normal strength. GCS eye subscore is 4. GCS verbal subscore is 5. GCS motor subscore is 6.  Strength is 5/5 in her iliopsoas, quads, hands, gastroc anterior tibialis, EHL.     Assessment/Plan 31 year presents for hardware removal  Philipe Laswell P, MD 12/13/2015, 4:08 PM

## 2015-12-14 ENCOUNTER — Encounter (HOSPITAL_COMMUNITY): Payer: Self-pay | Admitting: Neurosurgery

## 2015-12-14 MED ORDER — CYCLOBENZAPRINE HCL 10 MG PO TABS: 10.0000 mg | ORAL_TABLET | Freq: Three times a day (TID) | ORAL | Status: DC | PRN

## 2015-12-14 MED ORDER — OXYCODONE-ACETAMINOPHEN 5-325 MG PO TABS: 1.0000 | ORAL_TABLET | ORAL | Status: DC | PRN

## 2015-12-14 NOTE — Discharge Instructions (Signed)
No lifting no bending no twisting no driving  Wound Care Keep incision covered and dry for one week.  If you shower prior to then, cover incision with plastic wrap.  You may remove outer bandage after one week and shower.  Do not put any creams, lotions, or ointments on incision. Leave steri-strips on neck.  They will fall off by themselves. Activity Walk each and every day, increasing distance each day. No lifting greater than 5 lbs.  Avoid bending, arching, or twisting. No driving for 2 weeks; may ride as a passenger locally. If provided with back brace, wear when out of bed.  It is not necessary to wear in bed. Diet Resume your normal diet.  Return to Work Will be discussed at you follow up appointment. Call Your Doctor If Any of These Occur Redness, drainage, or swelling at the wound.  Temperature greater than 101 degrees. Severe pain not relieved by pain medication. Incision starts to come apart. Follow Up Appt Call today for appointment in 1-2 weeks [id number]) or for problems.  If you have any hardware placed in your spine, you will need an x-ray before your appointment.

## 2015-12-14 NOTE — Discharge Summary (Signed)
  Physician Discharge Summary  Patient ID: Katherine [name] MRN: [id number] DOB/AGE: 1929/06/02 82 y.o.  Admit date: 12/13/2015 Discharge date: 12/14/2015  Admission Diagnoses:Painful hardware  Discharge Diagnoses: Painful hardware Active Problems:   Painful orthopaedic hardware   Discharged Condition: good  Hospital Course: Patient is been hospital underwent reexploration of fusion we will hardware L4-S1 postoperatively patient did very well recovered in the floor on the floor was angling and voiding spontaneously tolerating regular diet stable for discharge home.  Consults: Significant Diagnostic Studies: Treatments: Removal of hardware L4-S1 Discharge Exam: Blood pressure 140/50, pulse 63, temperature 97.9 F (36.6 C), temperature source Oral, resp. rate 18, height 5\' 1"  (1.549 m), weight 49.125 kg (108 lb 4.8 oz), SpO2 95 %. Strength out of 5 wound clean dry and intact  Disposition: Home     Medication List    TAKE these medications        albuterol 108 (90 Base) MCG/ACT inhaler  Commonly known as:  PROVENTIL HFA;VENTOLIN HFA  Inhale 1-2 puffs into the lungs every 6 (six) hours as needed for wheezing or shortness of breath.     atorvastatin 10 MG tablet  Commonly known as:  LIPITOR  TAKE 1 TABLET (10 MG TOTAL) BY MOUTH DAILY.     B-COMPLEX/B-12 TR PO  Take 1 tablet by mouth daily.     cholecalciferol 1000 units tablet  Commonly known as:  VITAMIN D  Take 1,000 Units by mouth daily.     cyclobenzaprine 10 MG tablet  Commonly known as:  FLEXERIL  Take 1 tablet (10 mg total) by mouth 3 (three) times daily as needed for muscle spasms.     famotidine 40 MG tablet  Commonly known as:  PEPCID  Take 1 tablet (40 mg total) by mouth at bedtime as needed for heartburn.     oxyCODONE 5 MG immediate release tablet  Commonly known as:  Oxy IR/ROXICODONE  Take 5 mg by mouth 2 (two) times daily as needed for moderate pain.     oxyCODONE-acetaminophen 5-325 MG tablet   Commonly known as:  PERCOCET/ROXICET  Take 1-2 tablets by mouth every 4 (four) hours as needed for moderate pain.     PATADAY 0.2 % Soln  Generic drug:  Olopatadine HCl  Place 1 drop into both eyes daily as needed (for dry, allergy eyes).     traZODone 50 MG tablet  Commonly known as:  DESYREL  TAKE 2 TABLETS (100 MG TOTAL) BY MOUTH AT BEDTIME.     VIIBRYD 40 MG Tabs  Generic drug:  Vilazodone HCl  Take 40 mg by mouth daily as needed (for depression).     VITAMIN E PO  Take 1 tablet by mouth daily.           Follow-up Information    Follow up with [facility], MD.   Specialty:  Neurosurgery   Contact information:   [address] [place] [address] [phone]       Signed: Elaina Hoops 12/14/2015, 11:33 AM

## 2015-12-14 NOTE — Progress Notes (Signed)
Pt doing well. Pt and husband given D/C instructions with Rx's, verbal understanding was provided. Pt's incision is clean and dry with no sign of infection. Pt's IV was removed prior to D/C. Pt D/C'd home via wheelchair @ 1205 per MD order. Pt is stable @ D/C and has no other needs at this time. Holli Humbles, RN

## 2015-12-14 NOTE — Progress Notes (Signed)
Patient ID: Katherine [name], female   DOB: 03-26-1929, 82 y.o.   MRN: [id number] Doing well  Strength 5 out of 5 wound clean dry and intact  Discharge home

## 2016-01-28 ENCOUNTER — Other Ambulatory Visit: Payer: Self-pay | Admitting: Internal Medicine

## 2016-01-28 NOTE — Telephone Encounter (Signed)
Please refill once 

## 2016-04-08 ENCOUNTER — Other Ambulatory Visit: Payer: Self-pay | Admitting: Internal Medicine

## 2016-04-08 ENCOUNTER — Ambulatory Visit: Payer: Medicare Other | Admitting: Internal Medicine

## 2016-04-08 ENCOUNTER — Other Ambulatory Visit: Payer: Medicare Other | Admitting: Internal Medicine

## 2016-04-08 DIAGNOSIS — E785 Hyperlipidemia, unspecified: Secondary | ICD-10-CM

## 2016-04-08 DIAGNOSIS — Z0000 Encounter for general adult medical examination without abnormal findings: Secondary | ICD-10-CM

## 2016-04-08 LAB — COMPREHENSIVE METABOLIC PANEL
ALBUMIN: 4 g/dL (ref 3.6–5.1)
ALT: 12 U/L (ref 6–29)
AST: 29 U/L (ref 10–35)
Alkaline Phosphatase: 78 U/L (ref 33–130)
BILIRUBIN TOTAL: 0.4 mg/dL (ref 0.2–1.2)
BUN: 16 mg/dL (ref 7–25)
CO2: 23 mmol/L (ref 20–31)
CREATININE: 1.13 mg/dL — AB (ref 0.60–0.88)
Calcium: 9.6 mg/dL (ref 8.6–10.4)
Chloride: 102 mmol/L (ref 98–110)
Glucose, Bld: 115 mg/dL — ABNORMAL HIGH (ref 65–99)
Potassium: 4 mmol/L (ref 3.5–5.3)
SODIUM: 137 mmol/L (ref 135–146)
TOTAL PROTEIN: 6.8 g/dL (ref 6.1–8.1)

## 2016-04-08 LAB — CBC WITH DIFFERENTIAL/PLATELET
Basophils Absolute: 59 cells/uL (ref 0–200)
Basophils Relative: 1 %
EOS PCT: 1 %
Eosinophils Absolute: 59 cells/uL (ref 15–500)
HCT: 38.6 % (ref 35.0–45.0)
HEMOGLOBIN: 12.8 g/dL (ref 11.7–15.5)
LYMPHS ABS: 767 {cells}/uL — AB (ref 850–3900)
Lymphocytes Relative: 13 %
MCH: 28.4 pg (ref 27.0–33.0)
MCHC: 33.2 g/dL (ref 32.0–36.0)
MCV: 85.8 fL (ref 80.0–100.0)
MPV: 10.1 fL (ref 7.5–12.5)
Monocytes Absolute: 767 cells/uL (ref 200–950)
Monocytes Relative: 13 %
NEUTROS ABS: 4248 {cells}/uL (ref 1500–7800)
Neutrophils Relative %: 72 %
Platelets: 236 10*3/uL (ref 140–400)
RBC: 4.5 MIL/uL (ref 3.80–5.10)
RDW: 14.2 % (ref 11.0–15.0)
WBC: 5.9 10*3/uL (ref 3.8–10.8)

## 2016-04-08 LAB — LIPID PANEL
CHOLESTEROL: 124 mg/dL — AB (ref 125–200)
HDL: 65 mg/dL (ref >=46)
LDL CALC: 42 mg/dL (ref <130)
TRIGLYCERIDES: 85 mg/dL (ref <150)
Total CHOL/HDL Ratio: 1.9 Ratio (ref <=5.0)
VLDL: 17 mg/dL (ref <30)

## 2016-04-08 LAB — TSH: TSH: 1.82 mIU/L

## 2016-04-09 LAB — VITAMIN D 25 HYDROXY (VIT D DEFICIENCY, FRACTURES): Vit D, 25-Hydroxy: 39 ng/mL (ref 30–100)

## 2016-04-10 ENCOUNTER — Encounter: Payer: Self-pay | Admitting: Internal Medicine

## 2016-04-10 ENCOUNTER — Ambulatory Visit (INDEPENDENT_AMBULATORY_CARE_PROVIDER_SITE_OTHER): Payer: Medicare Other | Admitting: Internal Medicine

## 2016-04-10 VITALS — BP 112/70 | HR 117 | Ht 60.5 in | Wt 105.0 lb

## 2016-04-10 DIAGNOSIS — Z8711 Personal history of peptic ulcer disease: Secondary | ICD-10-CM

## 2016-04-10 DIAGNOSIS — Z87891 Personal history of nicotine dependence: Secondary | ICD-10-CM

## 2016-04-10 DIAGNOSIS — M549 Dorsalgia, unspecified: Secondary | ICD-10-CM

## 2016-04-10 DIAGNOSIS — F3289 Other specified depressive episodes: Secondary | ICD-10-CM

## 2016-04-10 DIAGNOSIS — Z8709 Personal history of other diseases of the respiratory system: Secondary | ICD-10-CM

## 2016-04-10 DIAGNOSIS — K219 Gastro-esophageal reflux disease without esophagitis: Secondary | ICD-10-CM

## 2016-04-10 DIAGNOSIS — Z0000 Encounter for general adult medical examination without abnormal findings: Secondary | ICD-10-CM

## 2016-04-10 DIAGNOSIS — J069 Acute upper respiratory infection, unspecified: Secondary | ICD-10-CM | POA: Diagnosis not present

## 2016-04-10 DIAGNOSIS — R739 Hyperglycemia, unspecified: Secondary | ICD-10-CM

## 2016-04-10 DIAGNOSIS — G8929 Other chronic pain: Secondary | ICD-10-CM

## 2016-04-10 DIAGNOSIS — E782 Mixed hyperlipidemia: Secondary | ICD-10-CM

## 2016-04-10 DIAGNOSIS — Z8639 Personal history of other endocrine, nutritional and metabolic disease: Secondary | ICD-10-CM

## 2016-04-10 LAB — POC URINALSYSI DIPSTICK (AUTOMATED)
Bilirubin, UA: NEGATIVE
Blood, UA: NEGATIVE
Glucose, UA: NEGATIVE
KETONES UA: NEGATIVE
LEUKOCYTES UA: NEGATIVE
NITRITE UA: NEGATIVE
PH UA: 7.5
PROTEIN UA: NEGATIVE
Spec Grav, UA: >=1.03
UROBILINOGEN UA: NEGATIVE

## 2016-04-10 MED ORDER — DULOXETINE HCL 30 MG PO CPEP: 30.0000 mg | ORAL_CAPSULE | Freq: Every day | ORAL | 3 refills | Status: DC

## 2016-04-10 MED ORDER — CEFTRIAXONE SODIUM 1 G IJ SOLR
1.0000 g | Freq: Once | INTRAMUSCULAR | Status: AC
  Administered 2016-04-10: 1 g via INTRAMUSCULAR

## 2016-04-10 MED ORDER — GABAPENTIN 300 MG PO CAPS: 300.0000 mg | ORAL_CAPSULE | Freq: Two times a day (BID) | ORAL | 3 refills | Status: DC

## 2016-04-10 MED ORDER — AMOXICILLIN 250 MG PO CAPS: 250.0000 mg | ORAL_CAPSULE | Freq: Three times a day (TID) | ORAL | 0 refills | Status: DC

## 2016-04-10 NOTE — Progress Notes (Signed)
Subjective:    Patient ID: Katherine [name], female    DOB: 11/01/1928, 82 y.o.   MRN: [id number]  HPI 82 year old Female in today for Medicare wellness exam and evaluation of medical issues. Patient has felt poorly over the past few months. She had additional surgery to remove hardware in her back by Dr. Saintclair Halsted but cannot get good pain relief.  Her fasting glucose is elevated at 1:15. Her creatinine is 1.13 and stable. Liver functions are normal. Lipid panel is normal. She is on statin medication. TSH is normal. CBC is normal.  She stopped taking antidepressant medication a while back. I'm starting her on Cymbalta 30 mg daily. I'm also going to start her on low-dose gabapentin 300 mg twice daily for pain control. She has lost 14 pounds over the past year.  She has respiratory infection today. I'm giving her 1 g IM Rocephin and starting her on amoxicillin. She needs to follow-up with me next week.  She has a history of peptic ulcer disease, hyperlipidemia, depression, B-12 deficiency.  She is intolerant of aspirin as it causes GI symptoms. Advil causes nausea.  Past medical history reviewed per dictation March 2015. She had appendectomy 1940, hysterectomy with right oophorectomy for endometriosis 1967, vagotomy and pyloroplasty 1975, benign left breast biopsy 1976. Bartholin's cyst excised twice in 1980 1982.  History of epidural steroid injections L5-S1 for back pain previously done by Dr. Niel Hummer. History of needle guided excision of right breast calcification in 2008 by Dr. Margot Chimes  Was found to be anemic and iron deficient in 2014. Had upper GI endoscopy showing stricture but no evidence of bleeding. Has strongly Hemoccult positive stool. Colonoscopy was incomplete due to tortuous colon. Anemia improved with iron therapy.  Social history: She has 2 adult daughters in good health. She completed high school and 2 years of college. 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  She has smoked for over 40 years. She and her husband are very active in Alcoholics Anonymous. This is her second marriage. No children from second marriage. She has 3 stepchildren.  Family history: Father died at age 90 with uremia. Mother died at age 70 due to age and alcoholism. One sister died at age 80 with complications of alcoholism. Another sister died at age 46 because of alcoholism and cancer.    Review of Systems coughing congestion. Chronic back pain. Depression     Objective:   Physical Exam  Constitutional: She is oriented to person, place, and time. She appears well-developed and well-nourished. No distress.  HENT:  Head: Normocephalic and atraumatic.  Right Ear: External ear normal.  Left Ear: External ear normal.  Mouth/Throat: Oropharynx is clear and moist.  Eyes: Pupils are equal, round, and reactive to light. Right eye exhibits no discharge. Left eye exhibits no discharge. No scleral icterus.  Neck: No JVD present. No thyromegaly present.  Cardiovascular: Normal rate, regular rhythm and normal heart sounds.   No murmur heard. Pulmonary/Chest: She has no wheezes. She has no rales.  Abdominal: Soft. Bowel sounds are normal. She exhibits no distension and no mass. There is no tenderness. There is no rebound and no guarding.  Genitourinary:  Genitourinary Comments: History of hysterectomy and right oophorectomy. Pap smear not repeated due to age.  Musculoskeletal: She exhibits no edema.  Lymphadenopathy:    She has no cervical adenopathy.  Neurological: She is alert and oriented to person, place, and time. She has normal reflexes. No cranial nerve deficit. Coordination normal.  Skin: Skin is warm and dry. No rash noted. She is not diaphoretic.  Psychiatric: She has a normal mood and affect. Her behavior is normal. Judgment and thought content normal.  Vitals reviewed.         Assessment & Plan:  Chronic back pain  Depression  Acute URI  COPD  History of  smoking  Hyperlipidemia  GE reflux  History of iron deficiency anemia in 2014  History of B-12 deficiency  History of vagotomy and follow her plasty  History of H. pylori infection  Recovering alcoholic in remission for many years  Hysterectomy and right oophorectomy  Plan: Cymbalta and gabapentin ordered. Return October 30. Amoxicillin 250 mg 3 times daily for 10 days.  Subjective:   Patient presents for Medicare Annual/Subsequent preventive examination.  Review Past Medical/Family/Social:See above   Risk Factors  Current exercise habits: Sedentary Dietary issues discussed: Low fat low carbohydrate  Cardiac risk factors:History of smoking, hyperlipidemia  Depression Screen  (Note: if answer to either of the following is "Yes", a more complete depression screening is indicated)   Over the past two weeks, have you felt down, depressed or hopeless? No  Over the past two weeks, have you felt little interest or pleasure in doing things? No Have you lost interest or pleasure in daily life? No Do you often feel hopeless? No Do you cry easily over simple problems? No   Activities of Daily Living  In your present state of health, do you have any difficulty performing the following activities?:   Driving? No  Managing money? No  Feeding yourself? No  Getting from bed to chair? No  Climbing a flight of stairs? Yes due to pain Preparing food and eating?: Yes due to pain Bathing or showering? No  Getting dressed: No  Getting to the toilet? No  Using the toilet:No  Moving around from place to place: No  In the past year have you fallen or had a near fall?:Yes Are you sexually active? No  Do you have more than one partner? No   Hearing Difficulties: No  Do you often ask people to speak up or repeat themselves? No  Do you experience ringing or noises in your ears? No  Do you have difficulty understanding soft or whispered voices? No  Do you feel that you have a problem  with memory? Sometimes Do you often misplace items? No    Home Safety:  Do you have a smoke alarm at your residence? Yes Do you have grab bars in the bathroom?Yes Do you have throw rugs in your house? Yes   Cognitive Testing  Alert? Yes Normal Appearance?Yes  Oriented to person? Yes Place? Yes  Time? Yes  Recall of three objects? Yes  Can perform simple calculations? Yes  Displays appropriate judgment?Yes  Can read the correct time from a watch face?Yes   List the Names of Other Physician/Practitioners you currently use:  See referral list for the physicians patient is currently seeing.     Review of Systems: See above   Objective:     General appearance: Appears stated age and thin  Head: Normocephalic, without obvious abnormality, atraumatic  Eyes: conj clear, EOMi PEERLA  Ears: normal TM's and external ear canals both ears  Nose: Nares normal. Septum midline. Mucosa normal. No drainage or sinus tenderness.  Throat: lips, mucosa, and tongue normal; teeth and gums normal  Neck: no adenopathy, no carotid bruit, no JVD, supple, symmetrical, trachea midline and thyroid not enlarged, symmetric, no  tenderness/mass/nodules  No CVA tenderness.  Lungs: clear to auscultation bilaterally  Breasts: normal appearance, no masses or tenderness,  Heart: regular rate and rhythm, S1, S2 normal, no murmur, click, rub or gallop  Abdomen: soft, non-tender; bowel sounds normal; no masses, no organomegaly  Musculoskeletal: ROM normal in all joints, no crepitus, no deformity, Normal muscle strengthen. Back  is symmetric, no curvature. Skin: Skin color, texture, turgor normal. No rashes or lesions  Lymph nodes: Cervical, supraclavicular, and axillary nodes normal.  Neurologic: CN 2 -12 Normal, Normal symmetric reflexes. Normal coordination and gait  Psych: Alert & Oriented x 3, Mood Is depressed   Assessment:    Annual wellness medicare exam   Plan:    During the course of the visit  the patient was educated and counseled about appropriate screening and preventive services including:   Annual mammogram  Annual flu vaccine recommended     Patient Instructions (the written plan) was given to the patient.  Medicare Attestation  I have personally reviewed:  The patient's medical and social history  Their use of alcohol, tobacco or illicit drugs  Their current medications and supplements  The patient's functional ability including ADLs,fall risks, home safety risks, cognitive, and hearing and visual impairment  Diet and physical activities  Evidence for depression or mood disorders  The patient's weight, height, BMI, and visual acuity have been recorded in the chart. I have made referrals, counseling, and provided education to the patient based on review of the above and I have provided the patient with a written personalized care plan for preventive services.

## 2016-04-11 LAB — HEMOGLOBIN A1C
HEMOGLOBIN A1C: 5.1 % (ref <5.7)
Mean Plasma Glucose: 100 mg/dL

## 2016-04-13 NOTE — Patient Instructions (Signed)
Amoxicillin 250 mg 3 times daily for 10 days for respiratory infection. Return October 30. Gabapentin 300 mg twice daily. Cymbalta 30 mg daily for depression and pain control

## 2016-04-14 ENCOUNTER — Encounter: Payer: Self-pay | Admitting: Internal Medicine

## 2016-04-14 ENCOUNTER — Ambulatory Visit (INDEPENDENT_AMBULATORY_CARE_PROVIDER_SITE_OTHER): Payer: Medicare Other | Admitting: Internal Medicine

## 2016-04-14 VITALS — BP 110/60 | HR 83 | Temp 96.7°F | Ht 60.5 in | Wt 106.0 lb

## 2016-04-14 DIAGNOSIS — F3289 Other specified depressive episodes: Secondary | ICD-10-CM | POA: Diagnosis not present

## 2016-04-14 DIAGNOSIS — J069 Acute upper respiratory infection, unspecified: Secondary | ICD-10-CM | POA: Diagnosis not present

## 2016-04-14 DIAGNOSIS — M545 Low back pain: Secondary | ICD-10-CM | POA: Diagnosis not present

## 2016-04-14 DIAGNOSIS — G8929 Other chronic pain: Secondary | ICD-10-CM

## 2016-04-14 NOTE — Progress Notes (Signed)
A lot

## 2016-04-14 NOTE — Progress Notes (Signed)
   Subjective:    Patient ID: GLATHA THELANDER, female    DOB: 06-Jan-1929, 82 y.o.   MRN: [id number]  HPI She is back today at my request to follow-up on respiratory infection symptoms, malaise fatigue depression and chronic back pain. She was started on low-dose gabapentin and Cymbalta last week. She is feeling better. In addition she had a respiratory infection and was started on amoxicillin. She looks more rested today. She has appointment to see Dr. Saintclair Halsted tomorrow. Says that she needs her oxycodone refilled by him. I do think she's got a require chronic narcotic pain medication for pain control. She doesn't want to have anymore neurosurgery. Last surgery was in June 2017. She had hardware removal L4-S1. Despite having the hardware removed she remained in pain and got depressed.   Review of Systems see above     Objective:   Physical Exam  Neck is supple. Chest clear to auscultation without rales or wheezing. Cardiac exam regular rate and rhythm normal S1 and S2      Assessment & Plan:  Chronic back pain-recently started on gabapentin and Cymbalta. Continue oxycodone sparingly for pain. Follow-up in 3 weeks.  Acute URI-improved with amoxicillin  Elevated serum glucose-hemoglobin A1c 5.1%  Plan: Follow-up in 3 weeks. We may need to titrate gabapentin dose of bit higher depending on how she's feeling. Continue Cymbalta.

## 2016-04-14 NOTE — Patient Instructions (Addendum)
Finish course of amoxicillin. Continue Cymbalta and gabapentin. Continue oxycodone sparingly for pain. Follow-up in 3 weeks.

## 2016-04-17 ENCOUNTER — Other Ambulatory Visit: Payer: Self-pay | Admitting: Internal Medicine

## 2016-04-21 ENCOUNTER — Other Ambulatory Visit: Payer: Self-pay | Admitting: *Deleted

## 2016-04-21 MED ORDER — DULOXETINE HCL 30 MG PO CPEP: 30.0000 mg | ORAL_CAPSULE | Freq: Every day | ORAL | 0 refills | Status: DC

## 2016-05-12 ENCOUNTER — Encounter: Payer: Self-pay | Admitting: Internal Medicine

## 2016-05-12 ENCOUNTER — Ambulatory Visit (INDEPENDENT_AMBULATORY_CARE_PROVIDER_SITE_OTHER): Payer: Medicare Other | Admitting: Internal Medicine

## 2016-05-12 VITALS — BP 110/70 | HR 94 | Temp 98.0°F | Ht 61.0 in | Wt 109.0 lb

## 2016-05-12 DIAGNOSIS — Z23 Encounter for immunization: Secondary | ICD-10-CM | POA: Diagnosis not present

## 2016-05-12 DIAGNOSIS — M542 Cervicalgia: Secondary | ICD-10-CM

## 2016-05-12 DIAGNOSIS — R432 Parageusia: Secondary | ICD-10-CM | POA: Diagnosis not present

## 2016-05-12 DIAGNOSIS — G8929 Other chronic pain: Secondary | ICD-10-CM | POA: Diagnosis not present

## 2016-05-12 DIAGNOSIS — M545 Low back pain: Secondary | ICD-10-CM

## 2016-05-12 DIAGNOSIS — R630 Anorexia: Secondary | ICD-10-CM | POA: Diagnosis not present

## 2016-05-12 MED ORDER — GABAPENTIN 300 MG PO CAPS: ORAL_CAPSULE | ORAL | 0 refills | Status: DC

## 2016-05-12 MED ORDER — OXYCODONE-ACETAMINOPHEN 10-325 MG PO TABS: 1.0000 | ORAL_TABLET | Freq: Three times a day (TID) | ORAL | 0 refills | Status: DC | PRN

## 2016-05-12 MED ORDER — CYCLOBENZAPRINE HCL 10 MG PO TABS: 10.0000 mg | ORAL_TABLET | Freq: Three times a day (TID) | ORAL | 0 refills | Status: DC | PRN

## 2016-05-12 NOTE — Progress Notes (Signed)
   Subjective:    Patient ID: Katherine [name], female    DOB: [birth year], 82 y.o.   MRN: [id number]  HPI She is here today to follow-up on chronic back pain and pain management. She's been taking oxycodone APAP 5mg   1 tablet twice daily. It doesn't give her complete pain relief and wears off fairly quickly. Last visit she was started on gabapentin 300 mg twice daily. It doesn't seem to be helping all that much. Have instructed her to increase gabapentin slowly to 3 times daily and then may take 2 capsules 3 times daily.  She is a recovering alcoholic and is worried about getting addicted to pain medication.  She is on Cymbalta for depression and back pain.  She is taken trazodone for many years for insomnia.  When she awakened she is quite stiff and is in a lot of pain. It takes 40 minutes after taking oxycodone and began to get any pain relief. It wears off in less than 4 hours. Most of the time she is simply putting up with the pain. Patient doesn't have a lot of appetite. Says she's lost taste for many of her favorite foods. She's not sure why that is.  She drinks Boost. She's concerned about her weight.  Prevnar 13 given today.  Weight today is 109 pounds at last visit, she weighed 106 pounds.    Review of Systems as above     Objective:   Physical Exam Spent 20 minutes speaking with her about these issues.  She's not getting adequate pain control. The oxycodone is wearing off quickly and it takes 40 minutes to it into her system. We have opportunity to increase gabapentin. She's asking about muscle relaxant and I think that would be reasonable to try at bedtime.       Assessment & Plan:  Chronic back and neck pain status post 3 neurosurgery procedures  Plan: Increase gabapentin to 1 capsule 3 times daily increasing slowly to 2 capsules 3 times daily. Flexeril 10 mg at bedtime. Continue trazodone at bedtime. Continue Cymbalta. Increase oxycodone a Pap from 5-10 mg and  increase frequency to 3 times daily. Prevnar 13 given today. Follow-up in 4-6 weeks.  We spoke about chronic pain management but she's not interested in seeing pain management physician at this point in time. Last surgery by Dr. Saintclair Halsted in June involved removing hardware L5-S1. Had left wrist fracture December 2016. Had lumbar fusion L3-S1 October 2016 with decompressive lumbar laminectomy L3-L4 and transforaminal lumbar interbody fusion L3-L4 from the right using globus Alterra expandable cage packed with locally harvested autograft mixed with Alastair morsels and BPM. Cortical screw fixation L3-S1 with placement of new screws at L3 bilaterally and S1 bilaterally. Exploration of fusion and removal of hardware L4-L5. Posterior lateral arthrodesis L3-S1 using locally harvested autograft mix with BMP, Alastair morsels and strips. Prior to that had had surgery February 2016 regarding posterior lumbar laminectomy L L4-L5 with grade 1 spondylolisthesis. She had decompressive lumbar laminectomy L4-L5 with posterior lumbar interbody fusion using globus rise expandable cage system packed with locally harvested autograft mixed with Allostem. Had severe lumbar spinal stenosis and lateral recess stenosis with bilateral L4 and L5 radiculopathies right greater than left.

## 2016-05-12 NOTE — Patient Instructions (Signed)
Increase oxycodone a Pap from 5-10 mg and increased frequency from every 12 hours to every 8 hours. Flexeril 10 mg at bedtime. Increase gabapentin from 300 mg twice daily up to a maximum of 2 capsules 3 times a day and follow-up in 4 weeks. Continue trazodone. Continue Cymbalta. Prevnar 13 given today.

## 2016-06-10 ENCOUNTER — Ambulatory Visit (INDEPENDENT_AMBULATORY_CARE_PROVIDER_SITE_OTHER): Payer: Medicare Other | Admitting: Internal Medicine

## 2016-06-10 ENCOUNTER — Encounter: Payer: Self-pay | Admitting: Internal Medicine

## 2016-06-10 VITALS — BP 110/80 | HR 72 | Wt 109.0 lb

## 2016-06-10 DIAGNOSIS — K219 Gastro-esophageal reflux disease without esophagitis: Secondary | ICD-10-CM | POA: Diagnosis not present

## 2016-06-10 DIAGNOSIS — M545 Low back pain, unspecified: Secondary | ICD-10-CM

## 2016-06-10 DIAGNOSIS — F3289 Other specified depressive episodes: Secondary | ICD-10-CM | POA: Diagnosis not present

## 2016-06-10 DIAGNOSIS — E782 Mixed hyperlipidemia: Secondary | ICD-10-CM

## 2016-06-10 DIAGNOSIS — M542 Cervicalgia: Secondary | ICD-10-CM

## 2016-06-10 DIAGNOSIS — G8929 Other chronic pain: Secondary | ICD-10-CM

## 2016-06-10 MED ORDER — OXYCODONE-ACETAMINOPHEN 10-325 MG PO TABS: 1.0000 | ORAL_TABLET | Freq: Four times a day (QID) | ORAL | 0 refills | Status: DC | PRN

## 2016-06-10 NOTE — Progress Notes (Signed)
   Subjective:    Patient ID: Katherine [name], female    DOB: 1929/05/25, 82 y.o.   MRN: [id number]  HPI Follow-up on chronic back pain requiring multi-medication therapy including oxycodone a Pap. She's only been taking 2 of these a day. She is worried about addiction. She is a recovering alcoholic and has been sober for many years. Of also placed her on gabapentin. When she takes gabapentin with oxycodone she feels "drunk ". These 2 do not work together for her. She feels that she probably should take her oxycodone a Pap 3 times a day or so but sometimes doesn't get to do that and I think is worried about getting addicted. She had her hardware removed from her back after several back surgeries by Dr. Saintclair Halsted. Continues to have chronic recurrent back pain. She went to see family members at the beach this past weekend and rode the car for. Of time her back was quite sore when she got home. She has appointment to see Dr. Saintclair Halsted soon. With regard to depression symptoms seem to improve with better pain control.  There is no reason to suspect she is abusing narcotic pain medication. She is taking medication sparingly and as needed.  She is also taking Cymbalta which is a helpful SSRI with chronic back pain.  Review of Systems     Objective:   Physical Exam Not examined. Spent 25 minutes speaking with her about pain control, issues with opioid medication and adverse side effects with gabapentin.       Assessment & Plan:  Chronic back pain status post multiple surgeries  Recovering alcoholic  Hyperlipidemia-on statin therapy  History of depression  Plan: Return in 90 days. She does have Flexeril on hand in addition to gabapentin and oxycodone 10/325 mg. She also takes Desiree L for history of depression and insomnia. She takes Lipitor for hyperlipidemia.

## 2016-06-10 NOTE — Patient Instructions (Addendum)
Follow-up with Dr. Saintclair Halsted. Continue oxycodone codone/APAP. Try gabapentin sparingly and not at the same time as oxycodone on APAP. May increase oxycodone APAP to 3 times daily. Return in 90 days.

## 2016-07-14 ENCOUNTER — Other Ambulatory Visit: Payer: Self-pay | Admitting: Ophthalmology

## 2016-07-29 ENCOUNTER — Encounter: Payer: Self-pay | Admitting: Internal Medicine

## 2016-07-29 ENCOUNTER — Ambulatory Visit (INDEPENDENT_AMBULATORY_CARE_PROVIDER_SITE_OTHER): Payer: Medicare Other | Admitting: Internal Medicine

## 2016-07-29 VITALS — BP 108/70 | HR 94 | Temp 97.5°F | Wt 108.0 lb

## 2016-07-29 DIAGNOSIS — G8929 Other chronic pain: Secondary | ICD-10-CM

## 2016-07-29 DIAGNOSIS — M545 Low back pain, unspecified: Secondary | ICD-10-CM

## 2016-07-29 DIAGNOSIS — F4320 Adjustment disorder, unspecified: Secondary | ICD-10-CM | POA: Diagnosis not present

## 2016-07-29 DIAGNOSIS — G4709 Other insomnia: Secondary | ICD-10-CM

## 2016-07-29 DIAGNOSIS — F3289 Other specified depressive episodes: Secondary | ICD-10-CM

## 2016-07-29 DIAGNOSIS — F4321 Adjustment disorder with depressed mood: Secondary | ICD-10-CM

## 2016-07-29 MED ORDER — TRAZODONE HCL 50 MG PO TABS: ORAL_TABLET | ORAL | 1 refills | Status: DC

## 2016-07-29 MED ORDER — OXYCODONE-ACETAMINOPHEN 10-325 MG PO TABS: 1.0000 | ORAL_TABLET | Freq: Four times a day (QID) | ORAL | 0 refills | Status: DC | PRN

## 2016-07-29 MED ORDER — VILAZODONE HCL 40 MG PO TABS: 40.0000 mg | ORAL_TABLET | Freq: Every day | ORAL | 3 refills | Status: DC

## 2016-07-29 NOTE — Progress Notes (Signed)
   Subjective:    Patient ID: Katherine [name], female    DOB: 1928-10-22, 83 y.o.   MRN: [id number]  HPI   83 year old Female for follow up on pain management.Patient having issues with sleep. Says trazodone is not helping.   Sister died with complications of lung cancer recently.  Patient is the only sibling left and realizes she's a bit alone. Says this is been depressing.  Patient continues to be active in Alcoholics Anonymous and 4 sisters after go to meetings even if she doesn't feel up to it.   Pain relief is about a 5 today but a 3 on many days. Oxycodone has resulted in  considerable improvement in pain control. Not able to do as much housework and for not as long a time time as she used to due to back pain.  Review of Systems see above  No cough cold or flu symptoms.     Objective:   Physical Exam Spent 20 minutes speaking with her about these issues.       Assessment & Plan:  Chronic back pain-refill oxycodone/APAP. Continue Neurontin and when necessary Flexeril  Depression-patient does not feel she was doing well with Cymbalta. I gave it to her because of her history of back pain and thought this might help her back pain in addition to depression. She previously took [place] with success. We are going to restart vibrated 40 mg daily. Samples provided.  Insomnia-may take 150 mg of trazodone at bedtime  Long-standing recovering alcoholic active in AA  Return in 6-8 weeks to follow-up on depression and insomnia or when necessary. She is aware she has to have ninety-day assessments to continue receiving written prescriptions for oxycodone/apap.

## 2016-08-13 NOTE — Patient Instructions (Signed)
Increase trazodone to 150 mg at bedtime. Continue oxycodone APAP as prescribed. Continue Neurontin and Flexeril. Change Cymbalta to Viibryd. Follow-up in 6-8 weeks.

## 2016-08-14 ENCOUNTER — Telehealth: Payer: Self-pay | Admitting: Internal Medicine

## 2016-08-14 NOTE — Telephone Encounter (Signed)
Will need visit in 90 days to get Oxycodone

## 2016-08-14 NOTE — Telephone Encounter (Signed)
Called to follow up with patient on new medication that she was put on 2 weeks ago for sleep aide.  Increased her Trazodone to 150mg  at bedtime.  Changed her Cymbalta to Viibryd.  Patient states that she is still not sleeping any better.  She said she really appreciates your efforts, but maybe it's just a part of getting older that less sleep is required, but she requires more, but just can't sleep more.    Your note indicated to RTC in 6-8 weeks.  Patient did not make an appointment for this visit.  Are you ok with my calling her back to provide a date for this visit?     Also spoke with her about the [facility] DMHDDSAS Query Report.  It shows that patient received #10 [id number] Hydrocodone-APAP from Dr. Isidoro Donning in [place], [place] on 07/14/16.  Inquired to patient about this.  She advised that she was referred to this doctor by her eye doctor to perform a corrective procedure on her lower eyelid.  He prescribed a small amount for pain.  Patient states that she didn't even use them and still has them.  Advised we have to ask about it because it showed up on our Query and we need the information for Audit purposes.

## 2016-09-30 ENCOUNTER — Other Ambulatory Visit: Payer: Self-pay

## 2016-09-30 MED ORDER — OXYCODONE-ACETAMINOPHEN 10-325 MG PO TABS: 1.0000 | ORAL_TABLET | Freq: Four times a day (QID) | ORAL | 0 refills | Status: DC | PRN

## 2016-10-20 ENCOUNTER — Other Ambulatory Visit: Payer: Self-pay | Admitting: Internal Medicine

## 2016-10-21 ENCOUNTER — Ambulatory Visit (INDEPENDENT_AMBULATORY_CARE_PROVIDER_SITE_OTHER): Payer: Medicare Other | Admitting: Internal Medicine

## 2016-10-21 VITALS — BP 120/70 | HR 91 | Temp 97.7°F | Wt 105.0 lb

## 2016-10-21 DIAGNOSIS — F3289 Other specified depressive episodes: Secondary | ICD-10-CM

## 2016-10-21 DIAGNOSIS — F1190 Opioid use, unspecified, uncomplicated: Secondary | ICD-10-CM | POA: Diagnosis not present

## 2016-10-21 DIAGNOSIS — G4709 Other insomnia: Secondary | ICD-10-CM

## 2016-10-21 DIAGNOSIS — G8929 Other chronic pain: Secondary | ICD-10-CM

## 2016-10-21 DIAGNOSIS — M542 Cervicalgia: Secondary | ICD-10-CM | POA: Diagnosis not present

## 2016-10-21 DIAGNOSIS — M545 Low back pain, unspecified: Secondary | ICD-10-CM

## 2016-10-21 DIAGNOSIS — R350 Frequency of micturition: Secondary | ICD-10-CM

## 2016-10-21 MED ORDER — OXYCODONE-ACETAMINOPHEN 10-325 MG PO TABS: 1.0000 | ORAL_TABLET | Freq: Four times a day (QID) | ORAL | 0 refills | Status: DC | PRN

## 2016-11-12 ENCOUNTER — Encounter: Payer: Self-pay | Admitting: Internal Medicine

## 2016-11-12 NOTE — Progress Notes (Signed)
   Subjective:    Patient ID: Katherine [name], female    DOB: 08/29/1928, 83 y.o.   MRN: [id number]  HPI 83 year old Female status post L5-S1 hardware removal by Dr. Saintclair Halsted June 2017 with chronic back pain requiring narcotic pain medication in today for pain management visit.  She is also having urinary frequency. Unable to urinate today. Given cup and may bring specimen back tomorrow.  In February she weighed 108 pounds and now weighs 105 pounds.  She takes trazodone at bedtime and has taken this for many years for sleep and depression. Current dose is 100 mg nightly. May increase to 150 mg if tolerated. Doesn't feel that she's been sleeping quite as well recently.  Gabapentin makes her drowsy. May take last dose of gabapentin with her trazodone which may help her sleep.  Oxycodone has helped her pain considerably. She's not able to do as much housework as she has done in years past but her husband helps. If she does a fair amount of activity such as housework or walking she gets more pain.  Her sister died earlier this year with complications of lung cancer. Patient is the only sibling left in the family.  Continues to be active in Alcoholics Anonymous.  She is now back on Viibryd. Cymbalta did not seem to help back pain or depression.  Dose of Viibryd is 40 mg daily.  Currently on Percocet 10/325 every 6 hours as needed. When it begins to wear off she begins to experience more pain. Gabapentin is prescribed 3 times daily.    Review of Systems see above-complaining of urinary frequency. Think she may have a UTI but good operative insurance specimen today     Objective:   Physical Exam  Spent 25 minutes speaking with her about these issues.      Assessment & Plan:  Chronic back pain  Alcoholic in recovery for many years  Depression-improved with Viibryd  History of insomnia  ? UTI  Plan: Patient advised to take sterile cup home and bring urine specimen back to office for  check. See changes made above to current chronic pain management including increasing trazodone 150 mg at night and taking gabapentin last dose at bedtime. Follow-up in 3 months.

## 2016-11-12 NOTE — Patient Instructions (Signed)
May take last is of gabapentin with trazodone. Trazodone may be increased to 150 mg at bedtime. Continue Percocet 10/325 4 times daily. Follow-up in 3 months or sooner if necessary. Please submit urine for specimen check

## 2016-12-08 ENCOUNTER — Other Ambulatory Visit: Payer: Self-pay

## 2016-12-08 MED ORDER — TRAZODONE HCL 50 MG PO TABS: 150.0000 mg | ORAL_TABLET | Freq: Every day | ORAL | 3 refills | Status: DC

## 2016-12-09 ENCOUNTER — Other Ambulatory Visit: Payer: Self-pay | Admitting: Internal Medicine

## 2016-12-09 DIAGNOSIS — Z1231 Encounter for screening mammogram for malignant neoplasm of breast: Secondary | ICD-10-CM

## 2016-12-19 ENCOUNTER — Ambulatory Visit: Payer: Medicare Other

## 2016-12-22 ENCOUNTER — Ambulatory Visit
Admission: RE | Admit: 2016-12-22 | Discharge: 2016-12-22 | Disposition: A | Discharge status: Home / Self Care | Payer: Medicare Other | Source: Ambulatory Visit | Attending: Internal Medicine | Admitting: Internal Medicine

## 2016-12-22 DIAGNOSIS — Z1231 Encounter for screening mammogram for malignant neoplasm of breast: Secondary | ICD-10-CM

## 2017-01-16 ENCOUNTER — Telehealth: Payer: Self-pay | Admitting: Internal Medicine

## 2017-01-16 NOTE — Telephone Encounter (Signed)
She will need urine specimen  for microscopic and U/a to lab next week.

## 2017-01-16 NOTE — Telephone Encounter (Signed)
Lauralyn Primes, NP with [facility] calling.  States she saw the patient.  She is calling to report an abnormal finding.  Found blood spilled protein in her urine.  States that she has family history of kidney disease in her family.  Advised that patient has a follow up appointment with you on Tuesday, 8/7 @ 2:45.    She's coming for a 3 month med check.  Do you want to get a urine on her?

## 2017-01-20 ENCOUNTER — Ambulatory Visit (INDEPENDENT_AMBULATORY_CARE_PROVIDER_SITE_OTHER): Payer: Medicare Other | Admitting: Internal Medicine

## 2017-01-20 ENCOUNTER — Encounter: Payer: Self-pay | Admitting: Internal Medicine

## 2017-01-20 VITALS — BP 118/80 | HR 81 | Temp 97.7°F | Wt 104.0 lb

## 2017-01-20 DIAGNOSIS — G8929 Other chronic pain: Secondary | ICD-10-CM

## 2017-01-20 DIAGNOSIS — R809 Proteinuria, unspecified: Secondary | ICD-10-CM

## 2017-01-20 DIAGNOSIS — R634 Abnormal weight loss: Secondary | ICD-10-CM

## 2017-01-20 LAB — POCT URINALYSIS DIPSTICK
Bilirubin, UA: NEGATIVE
Glucose, UA: NEGATIVE
KETONES UA: NEGATIVE
Leukocytes, UA: NEGATIVE
Nitrite, UA: NEGATIVE
RBC UA: NEGATIVE
SPEC GRAV UA: 1.025 (ref 1.010–1.025)
UROBILINOGEN UA: 0.2 U/dL
pH, UA: 6 (ref 5.0–8.0)

## 2017-01-20 MED ORDER — ALPRAZOLAM 0.25 MG PO TABS: 0.2500 mg | ORAL_TABLET | Freq: Two times a day (BID) | ORAL | 0 refills | Status: DC | PRN

## 2017-01-20 MED ORDER — OXYCODONE-ACETAMINOPHEN 10-325 MG PO TABS: 1.0000 | ORAL_TABLET | Freq: Four times a day (QID) | ORAL | 0 refills | Status: DC | PRN

## 2017-01-21 LAB — BASIC METABOLIC PANEL
BUN: 24 mg/dL (ref 7–25)
CALCIUM: 9.2 mg/dL (ref 8.6–10.4)
CO2: 25 mmol/L (ref 20–32)
CREATININE: 1.12 mg/dL — AB (ref 0.60–0.88)
Chloride: 106 mmol/L (ref 98–110)
Glucose, Bld: 121 mg/dL — ABNORMAL HIGH (ref 65–99)
Potassium: 3.3 mmol/L — ABNORMAL LOW (ref 3.5–5.3)
Sodium: 142 mmol/L (ref 135–146)

## 2017-01-21 LAB — URINALYSIS, MICROSCOPIC ONLY
BACTERIA UA: NONE SEEN [HPF]
Casts: NONE SEEN [LPF]
YEAST: NONE SEEN [HPF]

## 2017-01-21 LAB — PREALBUMIN: Prealbumin: 24 mg/dL (ref 17–34)

## 2017-02-08 NOTE — Progress Notes (Addendum)
   Subjective:    Patient ID: Katherine [name], female    DOB: 03/06/1929, 83 y.o.   MRN: [id number]  HPI 83 year old Female in today to discuss chronic pain management. She is an alcoholic in recovery and has not had any alcohol consumption in many years. She is very active in Alcoholics Anonymous. 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  She is status post L5-S1 hardware removal by Dr. Saintclair Halsted in June 2017. After that continued to have chronic severe back pain requiring narcotic pain medication.  We've been following her weight for some time. In May she weighed 105 pounds and in February weighed 108 pounds. She has Ensure as a supplement but doesn't have much appetite. Now weighs 104 pounds. Her pre-albumin is normal at 24.  Oxycodone has helped her pain considerably but she never is completely pain-free. She is not able to do as much housework as she is done in years past. Her husband helps her.  Nurse from her insurance company did a home visit and told her she had proteinuria. We did a urine specimen and noted small protein. We sent urine to hospital for microscopic examination and UA. No protein was noted in that specimen. No protein was noted in dipstick UA in November 2017. Patient is frightened about chronic kidney disease due to family history. We did a basic metabolic panel. Her potassium was slightly low at 3.3 and previously was 4.0 10 months ago. Creatinine is 1.12 and 10 months ago was 1.13. She was reassured that we will continue to monitor. I'm not concerned about proteinuria.  Her TSH was normal in October 2017  Control substances database reviewed. Taking medication probably not as often as she should buy refill information.    Review of Systems see above-Complaining of anxiety. Small quantity of Xanax prescribed     Objective:   Physical Exam 25 minutes speaking with her about pain control and these other issues. Her neck is supple there is no  thyromegaly. Chest clear. Cardiac exam regular rate and rhythm extremities without edema       Assessment & Plan:  1 pound weight loss since last visit. Continue to monitor. Ensure samples have been provided.  Chronic pain management-continue same medications. She feels this is tolerable.  Pre-albumin is normal-continue counseling regarding significant calorie intake. Not sure why she doesn't have much of an appetite. She doesn't want to take Megace.  Plan: Continue same pain medications and return in 3 months.

## 2017-02-08 NOTE — Patient Instructions (Signed)
Continued pain medications at previously prescribed in addition to other medications and return in 3 months. Try to eat as much as possible. Continue Ensure for supplement. Pre-albumin is normal.

## 2017-02-19 ENCOUNTER — Other Ambulatory Visit: Payer: Medicare Other | Admitting: Internal Medicine

## 2017-02-19 ENCOUNTER — Telehealth: Payer: Self-pay

## 2017-02-19 VITALS — Wt 104.0 lb

## 2017-02-19 DIAGNOSIS — R634 Abnormal weight loss: Secondary | ICD-10-CM

## 2017-02-19 LAB — POTASSIUM: Potassium: 3.6 mmol/L (ref 3.5–5.3)

## 2017-02-19 LAB — EXTRA LAV TOP TUBE

## 2017-02-19 NOTE — Telephone Encounter (Signed)
Pt came in this morning and had her blood drawn and I checked her weight, she was 104lb. Please advise if anymore follow up is needed

## 2017-02-19 NOTE — Telephone Encounter (Signed)
Continue to monitor weight. Ensure samples given today.

## 2017-02-24 ENCOUNTER — Encounter: Payer: Self-pay | Admitting: Internal Medicine

## 2017-02-24 ENCOUNTER — Ambulatory Visit (INDEPENDENT_AMBULATORY_CARE_PROVIDER_SITE_OTHER): Payer: Medicare Other | Admitting: Internal Medicine

## 2017-02-24 VITALS — BP 120/60 | HR 95 | Temp 97.5°F | Wt 105.0 lb

## 2017-02-24 DIAGNOSIS — J449 Chronic obstructive pulmonary disease, unspecified: Secondary | ICD-10-CM | POA: Diagnosis not present

## 2017-02-24 DIAGNOSIS — F1021 Alcohol dependence, in remission: Secondary | ICD-10-CM | POA: Diagnosis not present

## 2017-02-24 DIAGNOSIS — G8929 Other chronic pain: Secondary | ICD-10-CM | POA: Diagnosis not present

## 2017-02-24 DIAGNOSIS — F1190 Opioid use, unspecified, uncomplicated: Secondary | ICD-10-CM

## 2017-02-24 DIAGNOSIS — Z9889 Other specified postprocedural states: Secondary | ICD-10-CM | POA: Diagnosis not present

## 2017-02-24 DIAGNOSIS — Z862 Personal history of diseases of the blood and blood-forming organs and certain disorders involving the immune mechanism: Secondary | ICD-10-CM

## 2017-02-24 DIAGNOSIS — Z8639 Personal history of other endocrine, nutritional and metabolic disease: Secondary | ICD-10-CM | POA: Diagnosis not present

## 2017-02-24 DIAGNOSIS — M545 Low back pain: Secondary | ICD-10-CM

## 2017-03-08 NOTE — Progress Notes (Signed)
   Subjective:    Patient ID: Katherine [name], female    DOB: [birth year], 83 y.o.   MRN: [id number]  HPI 83 year old Female in today to discuss pain management issues and weight loss. She was here for chronic pain management visit in August. Chronic back pain despite having hardware removed at L5-S1 by Dr. Saintclair Halsted in June 2017. Required narcotic pain medication. She drinks ensures a supple month. Still doesn't have a lot of appetite. However pre-albumin was normal in August.  Oxycodone has helped but she's never completely pain-free and is reluctant to take this several times daily.  Says she seems less anxious and is taking Xanax sparingly. Weight is 105 pounds and was 104 pounds August 7.   Review of Systems reported to Clark she was not taking Xanax but she tells me she has taken it very sparingly. She has been on gabapentin for chronic pain. Taking 150 mg of desert really at bedtime. She is on fiber at 40 mg daily for pain and depression. She may take oxycodone a Pap 10/325 every 6 hours for pain control.     Objective:   Physical Exam  Not examined. Spent 15 minutes speaking with patient about these issues and reassuring her that it's okay to take narcotic pain medication and that it is being monitored. Also suggested to her we may want to consider chronic pain management evaluation if pain is not well controlled.      Assessment & Plan:  History of low normal B-12 in 2014 with level being 266. She is status post pyloroplasty. She takes B-12 supplement. History of he look of Bactrim pylori antibody positive and was treated in 2014. History of viral deficiency in 2014. She had significant anemia with hemoglobin of 9 g with an MCV of 77.2  We will check CBC, C met, iron and iron-binding capacity and B-12 level in the near future. We'll also check TSH.

## 2017-03-08 NOTE — Patient Instructions (Signed)
Please return for lab work in the near future. Continue in sure supplement. Continue chronic pain medication but take more frequently than twice a day.

## 2017-03-10 ENCOUNTER — Other Ambulatory Visit (INDEPENDENT_AMBULATORY_CARE_PROVIDER_SITE_OTHER): Payer: Medicare Other | Admitting: Internal Medicine

## 2017-03-10 ENCOUNTER — Other Ambulatory Visit: Payer: Self-pay | Admitting: Emergency Medicine

## 2017-03-10 DIAGNOSIS — R5383 Other fatigue: Secondary | ICD-10-CM

## 2017-03-10 DIAGNOSIS — R739 Hyperglycemia, unspecified: Secondary | ICD-10-CM

## 2017-03-11 LAB — COMPREHENSIVE METABOLIC PANEL
AG RATIO: 1.4 (calc) (ref 1.0–2.5)
ALKALINE PHOSPHATASE (APISO): 68 U/L (ref 33–130)
ALT: 7 U/L (ref 6–29)
AST: 18 U/L (ref 10–35)
Albumin: 3.6 g/dL (ref 3.6–5.1)
BILIRUBIN TOTAL: 0.3 mg/dL (ref 0.2–1.2)
BUN/Creatinine Ratio: 22 (calc) (ref 6–22)
BUN: 22 mg/dL (ref 7–25)
CALCIUM: 9.2 mg/dL (ref 8.6–10.4)
CHLORIDE: 105 mmol/L (ref 98–110)
CO2: 27 mmol/L (ref 20–32)
Creat: 0.98 mg/dL — ABNORMAL HIGH (ref 0.60–0.88)
GLOBULIN: 2.6 g/dL (ref 1.9–3.7)
Glucose, Bld: 81 mg/dL (ref 65–99)
Potassium: 4.5 mmol/L (ref 3.5–5.3)
Sodium: 141 mmol/L (ref 135–146)
Total Protein: 6.2 g/dL (ref 6.1–8.1)

## 2017-03-11 LAB — IRON, TOTAL/TOTAL IRON BINDING CAP
%SAT: 16 % (ref 11–50)
Iron: 57 ug/dL (ref 45–160)
TIBC: 349 ug/dL (ref 250–450)

## 2017-03-11 LAB — HEMOGLOBIN A1C
EAG (MMOL/L): 5.7 (calc)
HEMOGLOBIN A1C: 5.2 %{Hb} (ref <5.7)
MEAN PLASMA GLUCOSE: 103 (calc)

## 2017-03-11 LAB — CBC
HCT: 35.8 % (ref 35.0–45.0)
HEMOGLOBIN: 11.7 g/dL (ref 11.7–15.5)
MCH: 28.8 pg (ref 27.0–33.0)
MCHC: 32.7 g/dL (ref 32.0–36.0)
MCV: 88.2 fL (ref 80.0–100.0)
MPV: 10.4 fL (ref 7.5–12.5)
Platelets: 238 10*3/uL (ref 140–400)
RBC: 4.06 10*6/uL (ref 3.80–5.10)
RDW: 12.3 % (ref 11.0–15.0)
WBC: 5.4 10*3/uL (ref 3.8–10.8)

## 2017-03-11 LAB — FERRITIN: Ferritin: 22 ng/mL (ref 20–288)

## 2017-03-11 LAB — VITAMIN B12: Vitamin B-12: 1473 pg/mL — ABNORMAL HIGH (ref 200–1100)

## 2017-03-12 ENCOUNTER — Other Ambulatory Visit: Payer: Medicare Other | Admitting: Internal Medicine

## 2017-03-23 ENCOUNTER — Telehealth: Payer: Self-pay | Admitting: Internal Medicine

## 2017-03-23 MED ORDER — ALPRAZOLAM 0.25 MG PO TABS: 0.2500 mg | ORAL_TABLET | Freq: Two times a day (BID) | ORAL | 5 refills | Status: DC | PRN

## 2017-03-23 MED ORDER — OXYCODONE-ACETAMINOPHEN 10-325 MG PO TABS: 1.0000 | ORAL_TABLET | Freq: Four times a day (QID) | ORAL | 0 refills | Status: DC | PRN

## 2017-03-23 NOTE — Telephone Encounter (Signed)
Calling to request refill on her Xanax 0.25mg  and Oxycodone 10-325mg .  Husband would like to pick up Rx tomorrow.    Best # for contact:  [phone]  Thank you.

## 2017-03-23 NOTE — Telephone Encounter (Signed)
Pts husband is aware.  °

## 2017-03-23 NOTE — Telephone Encounter (Signed)
Refill Xanax x 6 months. Refill Oxycodone once

## 2017-04-29 ENCOUNTER — Telehealth: Payer: Self-pay

## 2017-04-29 NOTE — Telephone Encounter (Signed)
Per [facility] called pt to make yearly CPE, had to LVM for pt to return call to schedule 

## 2017-05-01 ENCOUNTER — Other Ambulatory Visit: Payer: Self-pay | Admitting: Internal Medicine

## 2017-05-11 ENCOUNTER — Telehealth: Payer: Self-pay | Admitting: Internal Medicine

## 2017-05-11 MED ORDER — OXYCODONE-ACETAMINOPHEN 10-325 MG PO TABS: 1.0000 | ORAL_TABLET | Freq: Four times a day (QID) | ORAL | 0 refills | Status: DC | PRN

## 2017-05-11 NOTE — Telephone Encounter (Signed)
Printed rx  

## 2017-05-11 NOTE — Telephone Encounter (Signed)
Patient calling to request her refill on her Oxycodone.  States that [name] would like to come and pick it up about Noon today.  And, he will be making her CPE for the end of the year.    Thank you.

## 2017-05-11 NOTE — Telephone Encounter (Signed)
Refill Oxycodone APAP 10/325  #120 one po q 6 hours prn back pain with no refill

## 2017-06-02 ENCOUNTER — Other Ambulatory Visit: Payer: Self-pay | Admitting: Internal Medicine

## 2017-06-22 ENCOUNTER — Other Ambulatory Visit: Payer: Self-pay

## 2017-06-22 NOTE — Telephone Encounter (Signed)
Patient called to request refills on the following medications. Last appointment 02/24/17, CPE scheduled on 07/14/17.

## 2017-06-22 NOTE — Telephone Encounter (Signed)
She needs pain management visit to refill Oxycodone before CPE later this month.

## 2017-06-24 ENCOUNTER — Other Ambulatory Visit: Payer: Self-pay | Admitting: Internal Medicine

## 2017-06-24 DIAGNOSIS — Z1329 Encounter for screening for other suspected endocrine disorder: Secondary | ICD-10-CM

## 2017-06-24 DIAGNOSIS — Z1321 Encounter for screening for nutritional disorder: Secondary | ICD-10-CM

## 2017-06-24 DIAGNOSIS — E538 Deficiency of other specified B group vitamins: Secondary | ICD-10-CM

## 2017-06-24 DIAGNOSIS — Z0000 Encounter for general adult medical examination without abnormal findings: Secondary | ICD-10-CM

## 2017-06-24 DIAGNOSIS — E785 Hyperlipidemia, unspecified: Secondary | ICD-10-CM

## 2017-06-24 DIAGNOSIS — R7302 Impaired glucose tolerance (oral): Secondary | ICD-10-CM

## 2017-06-25 ENCOUNTER — Ambulatory Visit (INDEPENDENT_AMBULATORY_CARE_PROVIDER_SITE_OTHER): Payer: Medicare Other | Admitting: Internal Medicine

## 2017-06-25 VITALS — BP 122/64 | HR 78 | Temp 98.3°F | Ht 61.0 in | Wt 103.2 lb

## 2017-06-25 DIAGNOSIS — R634 Abnormal weight loss: Secondary | ICD-10-CM | POA: Diagnosis not present

## 2017-06-25 DIAGNOSIS — M545 Low back pain: Secondary | ICD-10-CM | POA: Diagnosis not present

## 2017-06-25 DIAGNOSIS — G4709 Other insomnia: Secondary | ICD-10-CM | POA: Diagnosis not present

## 2017-06-25 DIAGNOSIS — G8929 Other chronic pain: Secondary | ICD-10-CM

## 2017-06-25 DIAGNOSIS — F3289 Other specified depressive episodes: Secondary | ICD-10-CM | POA: Diagnosis not present

## 2017-06-25 MED ORDER — OXYCODONE-ACETAMINOPHEN 10-325 MG PO TABS: 1.0000 | ORAL_TABLET | Freq: Four times a day (QID) | ORAL | 0 refills | Status: DC | PRN

## 2017-06-25 MED ORDER — ALBUTEROL SULFATE HFA 108 (90 BASE) MCG/ACT IN AERS: INHALATION_SPRAY | RESPIRATORY_TRACT | 99 refills | Status: DC

## 2017-06-25 NOTE — Progress Notes (Signed)
   Subjective:    Patient ID: Katherine [name], female    DOB: 1929/03/02, 84 y.o.   MRN: [id number]  HPI For chronic pain management follow up.  She has a history of chronic low back pain requiring narcotic medication for relief.  She had extensive back surgery with posterior lumbar interbody fusion at L4-L5 in February 2016.  In October 2016 she had transforaminal lumbar interbody fusion L3-L4 and posterior lumbar fusion L3 through S1.  In July 2017 she had removal of hardware at L5-S1.  Despite multiple surgeries she really did not get complete relief of her back pain.  At times it has been depressing.  She could not do what she would like to do in terms of housekeeping and traveling.  She is maintained on narcotics which she does not abuse and takes as directed.  Has been given small quantity of Xanax to take as needed for anxiety which does not occur very often.  She has Viibryd for depression.  She takes Desyrel 150 mg at bedtime and is done this for many years.  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  She feels actually guilty about taking pain medication.  Currently on Percocet 10/325 every 6 hours as needed.  She was tried on gabapentin  for a while as well but is currently not taking that.  We have been watching her weight.  She does drink Ensure some as meal supplements.  Husband does most of the cooking.  She quit smoking in 2011.    Review of Systems see above     Objective:   Physical Exam Skin warm and dry.  Nodes none.  Neck is supple.  Weight is 103 pounds.  Her height is 5 feet 1 inches.  She is actually underweight.  Chest is clear.  Cardiac exam regular rate and rhythm.  Extremities without edema.  She is comfortable here in the office today without exquisite pain.       Assessment & Plan:  Chronic back pain treated with Percocet  History of  depression-treated with SSRI  Underweight-spoke with her about eating more  Long-standing history of alcoholic in recovery active in AA  History of smoking-quit in 2011  History of peptic ulcer disease  Plan: Refill oxycodone APAP 10/325 (Percocet) 1 p.o. every 6 hours as needed pain #120 with no refill

## 2017-07-09 ENCOUNTER — Other Ambulatory Visit: Payer: Medicare Other | Admitting: Internal Medicine

## 2017-07-09 DIAGNOSIS — Z1321 Encounter for screening for nutritional disorder: Secondary | ICD-10-CM

## 2017-07-09 DIAGNOSIS — E538 Deficiency of other specified B group vitamins: Secondary | ICD-10-CM

## 2017-07-09 DIAGNOSIS — R7302 Impaired glucose tolerance (oral): Secondary | ICD-10-CM

## 2017-07-09 DIAGNOSIS — Z1329 Encounter for screening for other suspected endocrine disorder: Secondary | ICD-10-CM

## 2017-07-09 DIAGNOSIS — Z0000 Encounter for general adult medical examination without abnormal findings: Secondary | ICD-10-CM

## 2017-07-09 DIAGNOSIS — E785 Hyperlipidemia, unspecified: Secondary | ICD-10-CM

## 2017-07-10 LAB — LIPID PANEL
CHOL/HDL RATIO: 2.3 (calc) (ref <5.0)
CHOLESTEROL: 138 mg/dL (ref <200)
HDL: 60 mg/dL (ref >50)
LDL CHOLESTEROL (CALC): 59 mg/dL
NON-HDL CHOLESTEROL (CALC): 78 mg/dL (ref <130)
TRIGLYCERIDES: 106 mg/dL (ref <150)

## 2017-07-10 LAB — CBC WITH DIFFERENTIAL/PLATELET
BASOS ABS: 39 {cells}/uL (ref 0–200)
BASOS PCT: 0.7 %
EOS ABS: 110 {cells}/uL (ref 15–500)
Eosinophils Relative: 2 %
HCT: 36.2 % (ref 35.0–45.0)
Hemoglobin: 12 g/dL (ref 11.7–15.5)
Lymphs Abs: 1381 cells/uL (ref 850–3900)
MCH: 28.4 pg (ref 27.0–33.0)
MCHC: 33.1 g/dL (ref 32.0–36.0)
MCV: 85.6 fL (ref 80.0–100.0)
MONOS PCT: 8.5 %
MPV: 10.3 fL (ref 7.5–12.5)
NEUTROS ABS: 3504 {cells}/uL (ref 1500–7800)
Neutrophils Relative %: 63.7 %
PLATELETS: 227 10*3/uL (ref 140–400)
RBC: 4.23 10*6/uL (ref 3.80–5.10)
RDW: 13.1 % (ref 11.0–15.0)
TOTAL LYMPHOCYTE: 25.1 %
WBC mixed population: 468 cells/uL (ref 200–950)
WBC: 5.5 10*3/uL (ref 3.8–10.8)

## 2017-07-10 LAB — COMPLETE METABOLIC PANEL WITH GFR
AG RATIO: 1.4 (calc) (ref 1.0–2.5)
ALKALINE PHOSPHATASE (APISO): 63 U/L (ref 33–130)
ALT: 14 U/L (ref 6–29)
AST: 27 U/L (ref 10–35)
Albumin: 3.7 g/dL (ref 3.6–5.1)
BILIRUBIN TOTAL: 0.3 mg/dL (ref 0.2–1.2)
BUN/Creatinine Ratio: 19 (calc) (ref 6–22)
BUN: 24 mg/dL (ref 7–25)
CALCIUM: 9.2 mg/dL (ref 8.6–10.4)
CHLORIDE: 108 mmol/L (ref 98–110)
CO2: 27 mmol/L (ref 20–32)
Creat: 1.29 mg/dL — ABNORMAL HIGH (ref 0.60–0.88)
GFR, EST NON AFRICAN AMERICAN: 37 mL/min/{1.73_m2} — AB (ref >=60)
GFR, Est African American: 43 mL/min/{1.73_m2} — ABNORMAL LOW (ref >=60)
Globulin: 2.6 g/dL (calc) (ref 1.9–3.7)
Glucose, Bld: 96 mg/dL (ref 65–99)
POTASSIUM: 4.1 mmol/L (ref 3.5–5.3)
Sodium: 143 mmol/L (ref 135–146)
Total Protein: 6.3 g/dL (ref 6.1–8.1)

## 2017-07-10 LAB — HEMOGLOBIN A1C
EAG (MMOL/L): 5.7 (calc)
Hgb A1c MFr Bld: 5.2 % of total Hgb (ref <5.7)
MEAN PLASMA GLUCOSE: 103 (calc)

## 2017-07-10 LAB — VITAMIN D 25 HYDROXY (VIT D DEFICIENCY, FRACTURES): Vit D, 25-Hydroxy: 32 ng/mL (ref 30–100)

## 2017-07-10 LAB — TSH: TSH: 1.27 mIU/L (ref 0.40–4.50)

## 2017-07-10 LAB — VITAMIN B12: Vitamin B-12: 1740 pg/mL — ABNORMAL HIGH (ref 200–1100)

## 2017-07-14 ENCOUNTER — Ambulatory Visit (INDEPENDENT_AMBULATORY_CARE_PROVIDER_SITE_OTHER): Payer: Medicare Other | Admitting: Internal Medicine

## 2017-07-14 ENCOUNTER — Encounter: Payer: Self-pay | Admitting: Internal Medicine

## 2017-07-14 VITALS — BP 110/60 | HR 71 | Ht 60.0 in | Wt 106.0 lb

## 2017-07-14 DIAGNOSIS — M545 Low back pain, unspecified: Secondary | ICD-10-CM

## 2017-07-14 DIAGNOSIS — E538 Deficiency of other specified B group vitamins: Secondary | ICD-10-CM

## 2017-07-14 DIAGNOSIS — F3289 Other specified depressive episodes: Secondary | ICD-10-CM

## 2017-07-14 DIAGNOSIS — E782 Mixed hyperlipidemia: Secondary | ICD-10-CM | POA: Diagnosis not present

## 2017-07-14 DIAGNOSIS — G8929 Other chronic pain: Secondary | ICD-10-CM | POA: Diagnosis not present

## 2017-07-14 DIAGNOSIS — Z87891 Personal history of nicotine dependence: Secondary | ICD-10-CM | POA: Diagnosis not present

## 2017-07-14 DIAGNOSIS — F1021 Alcohol dependence, in remission: Secondary | ICD-10-CM | POA: Diagnosis not present

## 2017-07-14 DIAGNOSIS — Z0000 Encounter for general adult medical examination without abnormal findings: Secondary | ICD-10-CM

## 2017-07-14 DIAGNOSIS — Z8711 Personal history of peptic ulcer disease: Secondary | ICD-10-CM | POA: Diagnosis not present

## 2017-07-14 DIAGNOSIS — Z8639 Personal history of other endocrine, nutritional and metabolic disease: Secondary | ICD-10-CM

## 2017-07-14 DIAGNOSIS — Z8619 Personal history of other infectious and parasitic diseases: Secondary | ICD-10-CM

## 2017-07-14 DIAGNOSIS — Z90721 Acquired absence of ovaries, unilateral: Secondary | ICD-10-CM

## 2017-07-14 DIAGNOSIS — J449 Chronic obstructive pulmonary disease, unspecified: Secondary | ICD-10-CM

## 2017-07-14 DIAGNOSIS — Z862 Personal history of diseases of the blood and blood-forming organs and certain disorders involving the immune mechanism: Secondary | ICD-10-CM | POA: Diagnosis not present

## 2017-07-14 DIAGNOSIS — Z9889 Other specified postprocedural states: Secondary | ICD-10-CM

## 2017-07-14 DIAGNOSIS — R011 Cardiac murmur, unspecified: Secondary | ICD-10-CM | POA: Diagnosis not present

## 2017-07-14 DIAGNOSIS — K219 Gastro-esophageal reflux disease without esophagitis: Secondary | ICD-10-CM

## 2017-07-14 DIAGNOSIS — Z90710 Acquired absence of both cervix and uterus: Secondary | ICD-10-CM

## 2017-07-14 LAB — POCT URINALYSIS DIPSTICK
Appearance: NORMAL
Bilirubin, UA: NEGATIVE
Glucose, UA: NEGATIVE
KETONES UA: NEGATIVE
Leukocytes, UA: NEGATIVE
NITRITE UA: NEGATIVE
ODOR: NORMAL
PH UA: 6.5 (ref 5.0–8.0)
Protein, UA: NEGATIVE
RBC UA: NEGATIVE
SPEC GRAV UA: 1.015 (ref 1.010–1.025)
UROBILINOGEN UA: 0.2 U/dL

## 2017-07-14 NOTE — Progress Notes (Signed)
Subjective:    Patient ID: Katherine [name], female    DOB: 1928-10-25, 84 y.o.   MRN: [id number]   84 year old White Female in today for health maintenance exam and evaluation of medical issues as well as Medicare wellness exam.Has gained 2 pounds since January 10th. Is making herself eat more.  She is here today for health maintenance exam and evaluation of medical issues.  She has chronic pain and is opioid dependent.  This is due to chronic back issues status post back surgeries.  She is an alcoholic in recovery for many years.  She does not abuse the pain medication.  It helps her function.  Otherwise she is in a lot of chronic severe back pain which interferes with her activities.  She does not do much housework anymore or standing for long time to cook.  Her husband helps with this.  Her creatinine fluctuates with her state of hydration.  She has a history of peptic ulcer disease, hyperlipidemia, depression, B12 deficiency.  She is intolerant of aspirin as it causes GI symptoms.  Advil causes nausea.  Past medical history: She had an appendectomy 1940, hysterectomy with right oophorectomy for endometriosis 1967, vagotomy and pyloroplasty 1975, benign left breast biopsy 1976.  Bartholin's cyst excised twice in 1980 and 1982.  History of epidural steroid injections L5-S1 for back pain previously done by Dr. Niel Hummer.  History of needle guided excision right breast calcification in 2008 by Dr. Margot Chimes.  Was found to be anemic and iron deficient in 2014.  Had upper GI endoscopy showing stricture with no evidence of bleeding.  At that time had strongly Hemoccult positive stool.  Colonoscopy was incomplete due to tortuous colon.  Anemia improved with iron therapy.  Social history: 2 adult daughters in good health.  She completed high school and 2 years of college.  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  Has smoked for over 40 years.  She and her husband are very active  in Alcoholics Anonymous.  This is her second marriage.  No children from second marriage.  She has 3 stepchildren.  Family history: Father died at age 45 with uremia.  Mother died at age 76.  One sister died at age 49 with complications of alcoholism.  Another sister died at age 41 because of alcoholism and cancer.  In February 2016, she had posterior lumbar interbody fusion at L4-L5.  In October 2016 she had transforaminal lumbar interbody fusion L3-L4 and posterior lumbar fusion L3-S1.  In June 2017 she had removal of hardware L5-S1.  Review of Systems chronic back pain     Objective:   Physical Exam  Constitutional: She is oriented to person, place, and time. She appears well-developed and well-nourished. No distress.  HENT:  Head: Normocephalic and atraumatic.  Right Ear: External ear normal.  Left Ear: External ear normal.  Mouth/Throat: Oropharynx is clear and moist.  Eyes: Conjunctivae and EOM are normal. Pupils are equal, round, and reactive to light. Right eye exhibits no discharge. Left eye exhibits no discharge. No scleral icterus.  Neck: Neck supple. No JVD present. No thyromegaly present.  Transmitted murmur bilaterally in carotids  Cardiovascular: Normal rate, regular rhythm and intact distal pulses.  Murmur heard. Pulmonary/Chest: Effort normal and breath sounds normal. She has no wheezes. She has no rales.  Abdominal: Soft. Bowel sounds are normal. She exhibits no distension and no mass. There is no tenderness. There is no rebound and no guarding.  Genitourinary:  Genitourinary  Comments: Deferred deferred  Musculoskeletal: She exhibits no edema.  Lymphadenopathy:    She has no cervical adenopathy.  Neurological: She is alert and oriented to person, place, and time. She has normal reflexes. No cranial nerve deficit. Coordination normal.  Skin: Skin is warm and dry. No rash noted. She is not diaphoretic.  Psychiatric: She has a normal mood and affect. Her behavior is  normal. Judgment and thought content normal.  Vitals reviewed.         Assessment & Plan:  History of 2 back surgeries and hardware removal now with chronic back pain treated with narcotic pain medication with fair relief  Systolic murmur-to have 2D echocardiogram in the near future.  She is asymptomatic.  Mild elevation of serum creatinine varies with date of hydration.  Was 1.29 fasting recently in 4 months ago 0.98 when well-hydrated  Remote history of alcoholism  History of peptic ulcer disease  Hyperlipidemia  B12 deficiency--  Status post vagotomy and pyloroplasty  Depression  Long-standing history of smoking  COPD  GE reflux  History of iron deficiency  Plan: Immunizations are up-to-date.  She will return on a every 3 month basis for pain management.  Subjective:   Patient presents for Medicare Annual/Subsequent preventive examination.  Review Past Medical/Family/Social:   Risk Factors  Current exercise habits: Mostly sedentary Dietary issues discussed: needs to keep weight stable  Cardiac risk factors: History of smoking, hyperlipidemia  Depression Screen  (Note: if answer to either of the following is "Yes", a more complete depression screening is indicated)   Over the past two weeks, have you felt down, depressed or hopeless? No  Over the past two weeks, have you felt little interest or pleasure in doing things? No Have you lost interest or pleasure in daily life? No Do you often feel hopeless? No Do you cry easily over simple problems? No   Activities of Daily Living  In your present state of health, do you have any difficulty performing the following activities?:   Driving?  I do not drive anymore now that I take narcotic pain medication Managing money? No  Feeding yourself? No  Getting from bed to chair? No  Climbing a flight of stairs?  - bothers my back preparing and get a bit short of breath food and eating?:  Yes  prolonged  long-standing bothers my back Bathing or showering? No  Getting dressed: No  Getting to the toilet? No  Using the toilet:No  Moving around from place to place: No  In the past year have you fallen or had a near fall?:No  Are you sexually active? No  Do you have more than one partner? No   Hearing Difficulties: No  Do you often ask people to speak up or repeat themselves? No  Do you experience ringing or noises in your ears? No  Do you have difficulty understanding soft or whispered voices?  yes Do you feel that you have a problem with memory?  Sometimes Do you often misplace items? No    Home Safety:  Do you have a smoke alarm at your residence? Yes Do you have grab bars in the bathroom?  Yes Do you have throw rugs in your house?  No   Cognitive Testing  Alert? Yes Normal Appearance?Yes  Oriented to person? Yes Place? Yes  Time? Yes  Recall of three objects? Yes  Can perform simple calculations? Yes  Displays appropriate judgment?Yes  Can read the correct time from a watch face?Yes  List the Names of Other Physician/Practitioners you currently use:  See referral list for the physicians patient is currently seeing.     Review of Systems: See above   Objective:     General appearance: Appears younger than stated age  Head: Normocephalic, without obvious abnormality, atraumatic  Eyes: conj clear, EOMi PEERLA  Ears: normal TM's and external ear canals both ears  Nose: Nares normal. Septum midline. Mucosa normal. No drainage or sinus tenderness.  Throat: lips, mucosa, and tongue normal; teeth and gums normal  Neck: no adenopathy, no carotid bruit, no JVD, supple, symmetrical, trachea midline and thyroid not enlarged, symmetric, no tenderness/mass/nodules  No CVA tenderness.  Lungs: clear to auscultation bilaterally  Breasts: normal appearance, no masses or tenderness, top of the pacemaker on left upper chest. Incision well-healed. It is tender.  Heart: regular rate  and rhythm, S1, S2 normal, no murmur, click, rub or gallop  Abdomen: soft, non-tender; bowel sounds normal; no masses, no organomegaly  Musculoskeletal: ROM normal in all joints, no crepitus, no deformity, Normal muscle strengthen. Back  is symmetric, no curvature. Skin: Skin color, texture, turgor normal. No rashes or lesions  Lymph nodes: Cervical, supraclavicular, and axillary nodes normal.  Neurologic: CN 2 -12 Normal, Normal symmetric reflexes. Normal coordination and gait  Psych: Alert & Oriented x 3, Mood appear stable.    Assessment:    Annual wellness medicare exam   Plan:    During the course of the visit the patient was educated and counseled about appropriate screening and preventive services including:   Annual flu vaccine     Patient Instructions (the written plan) was given to the patient.  Medicare Attestation  I have personally reviewed:  The patient's medical and social history  Their use of alcohol, tobacco or illicit drugs  Their current medications and supplements  The patient's functional ability including ADLs,fall risks, home safety risks, cognitive, and hearing and visual impairment  Diet and physical activities  Evidence for depression or mood disorders  The patient's weight, height, BMI, and visual acuity have been recorded in the chart. I have made referrals, counseling, and provided education to the patient based on review of the above and I have provided the patient with a written personalized care plan for preventive services.

## 2017-07-14 NOTE — Patient Instructions (Signed)
Continue same medications as previously prescribed.  It was a pleasure to see you today.  Follow-up in 3 months for pain management.  Have 2D echocardiogram to evaluate systolic murmur.

## 2017-07-14 NOTE — Patient Instructions (Addendum)
Percocet refilled.  Encourage patient to eat a bit more and drink Ensure at least once daily.  She has an upcoming physical exam which is been scheduled for some time but needed for pain medication before the date of that physical exam.  Return in 3 months

## 2017-07-16 ENCOUNTER — Ambulatory Visit (HOSPITAL_COMMUNITY)
Admission: RE | Admit: 2017-07-16 | Discharge: 2017-07-16 | Disposition: A | Discharge status: Home / Self Care | Payer: Medicare Other | Source: Ambulatory Visit | Attending: Internal Medicine | Admitting: Internal Medicine

## 2017-07-16 ENCOUNTER — Encounter: Payer: Self-pay | Admitting: Internal Medicine

## 2017-07-16 DIAGNOSIS — I350 Nonrheumatic aortic (valve) stenosis: Secondary | ICD-10-CM | POA: Insufficient documentation

## 2017-07-16 DIAGNOSIS — J449 Chronic obstructive pulmonary disease, unspecified: Secondary | ICD-10-CM | POA: Insufficient documentation

## 2017-07-16 DIAGNOSIS — R011 Cardiac murmur, unspecified: Secondary | ICD-10-CM

## 2017-07-16 DIAGNOSIS — E785 Hyperlipidemia, unspecified: Secondary | ICD-10-CM | POA: Diagnosis not present

## 2017-07-16 DIAGNOSIS — I083 Combined rheumatic disorders of mitral, aortic and tricuspid valves: Secondary | ICD-10-CM | POA: Diagnosis not present

## 2017-07-17 ENCOUNTER — Other Ambulatory Visit: Payer: Self-pay

## 2017-07-17 MED ORDER — TRAZODONE HCL 50 MG PO TABS: 150.0000 mg | ORAL_TABLET | Freq: Every day | ORAL | 3 refills | Status: DC

## 2017-08-17 ENCOUNTER — Telehealth: Payer: Self-pay | Admitting: Internal Medicine

## 2017-08-17 MED ORDER — OXYCODONE-ACETAMINOPHEN 10-325 MG PO TABS: 1.0000 | ORAL_TABLET | Freq: Four times a day (QID) | ORAL | 0 refills | Status: DC | PRN

## 2017-08-17 NOTE — Telephone Encounter (Signed)
Calling for a refill on her Oxycodone 10-325mg .  Would love to be able to get it today if possible.  Last filled on 06/25/17.  Not due for 3 month pain management appointment until April.    Her husband will be picking this up for her when he gets off of work, so we should call him when this is ready for pick up.    Thank you.

## 2017-08-17 NOTE — Telephone Encounter (Signed)
Refill once and call to pick up today.

## 2017-08-17 NOTE — Telephone Encounter (Signed)
Printed and signed by Dr. Renold Genta, pt was notified rx is ready for pick up.

## 2017-09-01 ENCOUNTER — Encounter: Payer: Self-pay | Admitting: Internal Medicine

## 2017-09-01 ENCOUNTER — Ambulatory Visit (INDEPENDENT_AMBULATORY_CARE_PROVIDER_SITE_OTHER): Payer: Medicare Other | Admitting: Internal Medicine

## 2017-09-01 ENCOUNTER — Ambulatory Visit
Admission: RE | Admit: 2017-09-01 | Discharge: 2017-09-01 | Disposition: A | Discharge status: Home / Self Care | Payer: Medicare Other | Source: Ambulatory Visit | Attending: Internal Medicine | Admitting: Internal Medicine

## 2017-09-01 ENCOUNTER — Other Ambulatory Visit: Payer: Medicare Other

## 2017-09-01 VITALS — BP 110/62 | HR 75 | Temp 98.1°F | Ht 60.0 in | Wt 102.0 lb

## 2017-09-01 DIAGNOSIS — J449 Chronic obstructive pulmonary disease, unspecified: Secondary | ICD-10-CM

## 2017-09-01 DIAGNOSIS — M545 Low back pain, unspecified: Secondary | ICD-10-CM

## 2017-09-01 DIAGNOSIS — G8929 Other chronic pain: Secondary | ICD-10-CM

## 2017-09-01 MED ORDER — HYDROMORPHONE HCL 2 MG PO TABS: 2.0000 mg | ORAL_TABLET | Freq: Three times a day (TID) | ORAL | 0 refills | Status: DC | PRN

## 2017-09-01 NOTE — Patient Instructions (Signed)
Dilaudid 2 mg p.o. every 8 hours for pain.  Continue trazodone.  Continue Viibryd.  Discontinue oxycodone APAP.  Lab work drawn and pending.  X-rays pending.  Referral to pain management.

## 2017-09-01 NOTE — Progress Notes (Signed)
   Subjective:    Patient ID: Katherine [name], female    DOB: Oct 21, 1928, 84 y.o.   MRN: [id number]  HPI Patient has a history of COPD having been a smoker in the remote past.  She has chronic back pain and is status post lumbar fusion L3-S1 by Dr. Saintclair Halsted in October 2016.  Then had further surgery to remove hardware and back June 2017.  She is now on chronic pain medication and is compliant with that.  Today she is complaining of severe back pain not relieved with oxycodone APAP in addition to trazodone which she is taken for years, Viibryd for depression at one point we tried her on gabapentin but she is not taking that at the present time.  She did not think it helped.  Sometimes takes Xanax very sparingly when she gets a bit anxious.  She is very careful not to abuse her medication.  She had an issue with alcohol abuse many years ago and is active in Alcoholics Anonymous.  We tried Cymbalta but that did not seem to help.  Went back to [employer].  Not able to do much housework due to back pain.  Today we drew labs including sed rate, ANA, rheumatoid factor and CCP.  We also drew an SPEP.  She will have a chest x-ray and LS spine films.  She is complaining of some shortness of breath but her pulse oximetry today is 97%.  I think the shortness of breath may be coming from anxiety with the back pain.  Today she is only had one oxycodone APAP tablet and it is now nearly 1 PM.    Review of Systems see above     Objective:   Physical Exam Chest clear to auscultation.  Spent 20 minutes speaking with her about these issues.  Cardiac exam regular rate and rhythm.  Pulse is 75 and regular.  Her weight is 102 pounds.       Assessment & Plan:  I thought about Duragesic patch.  I think it is best if she have a pain management consultation.  She will have x-rays and lab studies are pending.  I have given her Dilaudid 2 mg p.o. every 8 hours until we can get pain management consult.  Discontinue  oxygen codon APAP.

## 2017-09-03 ENCOUNTER — Other Ambulatory Visit: Payer: Self-pay | Admitting: Internal Medicine

## 2017-09-03 DIAGNOSIS — R7689 Other specified abnormal immunological findings in serum: Secondary | ICD-10-CM

## 2017-09-03 DIAGNOSIS — M7918 Myalgia, other site: Secondary | ICD-10-CM

## 2017-09-03 DIAGNOSIS — G8929 Other chronic pain: Secondary | ICD-10-CM

## 2017-09-03 DIAGNOSIS — R768 Other specified abnormal immunological findings in serum: Secondary | ICD-10-CM

## 2017-09-03 LAB — CBC WITH DIFFERENTIAL/PLATELET
BASOS ABS: 40 {cells}/uL (ref 0–200)
Basophils Relative: 0.7 %
EOS ABS: 91 {cells}/uL (ref 15–500)
Eosinophils Relative: 1.6 %
HCT: 35.7 % (ref 35.0–45.0)
HEMOGLOBIN: 11.9 g/dL (ref 11.7–15.5)
Lymphs Abs: 1864 cells/uL (ref 850–3900)
MCH: 29.2 pg (ref 27.0–33.0)
MCHC: 33.3 g/dL (ref 32.0–36.0)
MCV: 87.5 fL (ref 80.0–100.0)
MONOS PCT: 7.6 %
MPV: 10.6 fL (ref 7.5–12.5)
NEUTROS ABS: 3272 {cells}/uL (ref 1500–7800)
Neutrophils Relative %: 57.4 %
Platelets: 203 10*3/uL (ref 140–400)
RBC: 4.08 10*6/uL (ref 3.80–5.10)
RDW: 12.4 % (ref 11.0–15.0)
Total Lymphocyte: 32.7 %
WBC mixed population: 433 cells/uL (ref 200–950)
WBC: 5.7 10*3/uL (ref 3.8–10.8)

## 2017-09-03 LAB — PROTEIN ELECTROPHORESIS, SERUM
ALBUMIN ELP: 3.8 g/dL (ref 3.8–4.8)
ALPHA 1: 0.2 g/dL (ref 0.2–0.3)
Alpha 2: 0.7 g/dL (ref 0.5–0.9)
BETA 2: 0.2 g/dL (ref 0.2–0.5)
Beta Globulin: 0.4 g/dL (ref 0.4–0.6)
Gamma Globulin: 1 g/dL (ref 0.8–1.7)
TOTAL PROTEIN: 6.4 g/dL (ref 6.1–8.1)

## 2017-09-03 LAB — ANA: Anti Nuclear Antibody(ANA): POSITIVE — AB

## 2017-09-03 LAB — RHEUMATOID FACTOR

## 2017-09-03 LAB — ANTI-NUCLEAR AB-TITER (ANA TITER)

## 2017-09-03 LAB — CYCLIC CITRUL PEPTIDE ANTIBODY, IGG: Cyclic Citrullin Peptide Ab: <16 UNITS

## 2017-09-03 LAB — CK, TOTAL(REFL): Total CK: 55 U/L (ref 29–143)

## 2017-09-03 LAB — SEDIMENTATION RATE: SED RATE: 9 mm/h (ref 0–30)

## 2017-09-22 ENCOUNTER — Telehealth: Payer: Self-pay

## 2017-09-22 NOTE — Telephone Encounter (Signed)
Ryan from [facility] called to advise that he contacted patient to schedule appointment.  Spoke w/husband and he took a message and said he would have patient call back to schedule with Thurmond Butts.  Ryan advised me that they will hold the referral open for 3 months (approx. 12/22/17).  Made Dr. Renold Genta aware.

## 2017-09-23 ENCOUNTER — Telehealth: Payer: Self-pay

## 2017-09-23 MED ORDER — PROMETHAZINE HCL 12.5 MG PO TABS: 12.5000 mg | ORAL_TABLET | Freq: Three times a day (TID) | ORAL | 0 refills | Status: DC | PRN

## 2017-09-23 NOTE — Telephone Encounter (Signed)
Husband calling; states that he went to pharmacy to pick up Rx.  Pharmacy stated that they faxed over request to refill Promethazine (which apparently is an old Rx) for nausea.  I haven't seen anything come over on this.  He went to pick it up and they said they heard anything from [place].  Husband wants to know if you will refill this for patient?    Thank you.    Pharmacy:  [facility]  Phone:  [phone]

## 2017-09-23 NOTE — Telephone Encounter (Signed)
Refilled promethazine husband and wife are aware of appt with [facility] scheduled on 12/10/17 at 10:15am.

## 2017-09-23 NOTE — Telephone Encounter (Signed)
Can you refill this? First I have heard of this request. Please make sure she knows when pain management appt is

## 2017-09-28 ENCOUNTER — Other Ambulatory Visit: Payer: Self-pay

## 2017-09-28 ENCOUNTER — Telehealth: Payer: Self-pay | Admitting: Internal Medicine

## 2017-09-28 MED ORDER — HYDROMORPHONE HCL 2 MG PO TABS: 2.0000 mg | ORAL_TABLET | Freq: Three times a day (TID) | ORAL | 0 refills | Status: DC | PRN

## 2017-09-28 NOTE — Telephone Encounter (Signed)
Katherine [name], we generally require 24 hours notice on chronic pain meds.

## 2017-09-28 NOTE — Telephone Encounter (Signed)
Katherine [name] Self [phone]  [id number] [name], [place] [phone] (Phone) [phone] (Fax      HYDROmorphone (DILAUDID) 2 MG tablet  [name] called to say she needs a refill , she wanted her husband to pick up at 11:30 on his way home from work. I let her know we would call when it is ready and she would need to pick up and do urine test.

## 2017-09-29 ENCOUNTER — Telehealth: Payer: Self-pay | Admitting: Internal Medicine

## 2017-09-29 ENCOUNTER — Other Ambulatory Visit: Payer: Self-pay

## 2017-09-29 ENCOUNTER — Encounter: Payer: Self-pay | Admitting: Internal Medicine

## 2017-09-29 MED ORDER — OXYCODONE-ACETAMINOPHEN 10-325 MG PO TABS: 1.0000 | ORAL_TABLET | Freq: Four times a day (QID) | ORAL | 0 refills | Status: DC | PRN

## 2017-09-29 NOTE — Telephone Encounter (Signed)
Pt says she wanted Oxycodone APAP instead of Dilaudid.She had Dilaudid filled before realizing this. She will take Dilaudid back to pharmacy and we will give her Rx for Oxycodone APAP. She has an appt at [facility] June 27th.

## 2017-10-20 ENCOUNTER — Other Ambulatory Visit: Payer: Self-pay | Admitting: Internal Medicine

## 2017-11-16 ENCOUNTER — Other Ambulatory Visit: Payer: Self-pay | Admitting: Internal Medicine

## 2017-11-16 ENCOUNTER — Ambulatory Visit (INDEPENDENT_AMBULATORY_CARE_PROVIDER_SITE_OTHER): Payer: Medicare Other | Admitting: Internal Medicine

## 2017-11-16 ENCOUNTER — Encounter: Payer: Self-pay | Admitting: Internal Medicine

## 2017-11-16 VITALS — BP 140/70 | HR 68 | Temp 98.1°F | Ht 60.0 in | Wt 104.0 lb

## 2017-11-16 DIAGNOSIS — F329 Major depressive disorder, single episode, unspecified: Secondary | ICD-10-CM

## 2017-11-16 DIAGNOSIS — F32A Depression, unspecified: Secondary | ICD-10-CM

## 2017-11-16 DIAGNOSIS — F1021 Alcohol dependence, in remission: Secondary | ICD-10-CM | POA: Diagnosis not present

## 2017-11-16 DIAGNOSIS — M7918 Myalgia, other site: Secondary | ICD-10-CM | POA: Diagnosis not present

## 2017-11-16 DIAGNOSIS — F419 Anxiety disorder, unspecified: Secondary | ICD-10-CM | POA: Diagnosis not present

## 2017-11-16 DIAGNOSIS — G8929 Other chronic pain: Secondary | ICD-10-CM

## 2017-11-16 MED ORDER — OXYCODONE-ACETAMINOPHEN 10-325 MG PO TABS: 1.0000 | ORAL_TABLET | Freq: Four times a day (QID) | ORAL | 0 refills | Status: DC | PRN

## 2017-11-16 MED ORDER — ALPRAZOLAM 0.25 MG PO TABS: 0.2500 mg | ORAL_TABLET | Freq: Two times a day (BID) | ORAL | 1 refills | Status: DC | PRN

## 2017-11-16 MED ORDER — PROMETHAZINE HCL 12.5 MG PO TABS: 12.5000 mg | ORAL_TABLET | Freq: Three times a day (TID) | ORAL | 2 refills | Status: DC | PRN

## 2017-11-19 LAB — PAIN MGMT, PROFILE 8 W/CONF, U
6 ACETYLMORPHINE: NEGATIVE ng/mL (ref <10)
ALPHAHYDROXYALPRAZOLAM: NEGATIVE ng/mL (ref <25)
ALPHAHYDROXYMIDAZOLAM: NEGATIVE ng/mL (ref <50)
ALPHAHYDROXYTRIAZOLAM: NEGATIVE ng/mL (ref <50)
AMPHETAMINES: NEGATIVE ng/mL (ref <500)
Alcohol Metabolites: NEGATIVE ng/mL (ref <500)
Aminoclonazepam: NEGATIVE ng/mL (ref <25)
BUPRENORPHINE, URINE: NEGATIVE ng/mL (ref <5)
Benzodiazepines: NEGATIVE ng/mL (ref <100)
CODEINE: NEGATIVE ng/mL (ref <50)
CREATININE: 242.3 mg/dL
Cocaine Metabolite: NEGATIVE ng/mL (ref <150)
HYDROCODONE: NEGATIVE ng/mL (ref <50)
HYDROXYETHYLFLURAZEPAM: NEGATIVE ng/mL (ref <50)
Hydromorphone: NEGATIVE ng/mL (ref <50)
Lorazepam: NEGATIVE ng/mL (ref <50)
MDMA: NEGATIVE ng/mL (ref <500)
Marijuana Metabolite: NEGATIVE ng/mL (ref <20)
Morphine: NEGATIVE ng/mL (ref <50)
NORDIAZEPAM: NEGATIVE ng/mL (ref <50)
NORHYDROCODONE: NEGATIVE ng/mL (ref <50)
NOROXYCODONE: 13479 ng/mL — AB (ref <50)
OXAZEPAM: NEGATIVE ng/mL (ref <50)
OXIDANT: NEGATIVE ug/mL (ref <200)
OXYMORPHONE: 7386 ng/mL — AB (ref <50)
Opiates: NEGATIVE ng/mL (ref <100)
Oxycodone: 7046 ng/mL — ABNORMAL HIGH (ref <50)
Oxycodone: POSITIVE ng/mL — AB (ref <100)
PH: 5.56 (ref 4.5–9.0)
Temazepam: NEGATIVE ng/mL (ref <50)

## 2017-12-06 NOTE — Progress Notes (Signed)
   Subjective:    Patient ID: Katherine [name], female    DOB: Dec 16, 1928, 84 y.o.   MRN: [id number]  HPI 84 year old Female with history of chronic back pain status post lumbar surgery L4-L5 2016, as well as transforaminal lumbar interbody fusion L3-L4, posterior lumbar fusion L3-S1 with subsequent removal of hardware L5-S1 June 2017 5 neurosurgeon.  She currently is not getting good relief with treatment we have prescribed.  She has an appointment to be seen at [facility] in the near future.  She needs medications refilled until her appointment they are.  She is a long-standing alcoholic in recovery and does not abuse her medications.  She is unable to do housework.  Her husband helps her.  Sometimes takes Xanax when she gets anxious.  She has a history of depression.  We tried her on Cymbalta thinking it would help back pain but it did not help.  We went back to Viibryd.  She has had rheumatology test which proved to be negative as well as SPEP.  She had LS spine films and chest x-ray recently.  I tried her on Dilaudid but it seemed to be too strong for her.   Review of Systems     Objective:   Physical Exam  I spent 15 minutes speaking with her about these issues.      Assessment & Plan:  Chronic back pain-have refilled oxycodone APAP 10/325 #120 to take 1 tablet p.o. every 6 hours for pain.  Keep appointment with pain management in the near future.  Anxiety depression-treated with Xanax, trazodone, Viibryd.  Plan: Urine drug test obtained.  Continue Viibryd.  She has been old as a real for many years as a recovering alcoholic.  We will continue that medication.  Take Xanax twice daily as needed for anxiety.  Her physical exam was done in late January 2019.

## 2017-12-06 NOTE — Patient Instructions (Signed)
Oxycodone APAP refilled at patient request.  Take Xanax sparingly for anxiety.  Continue trazodone and Viibryd.  Keep appointment with pain management.  Follow-up in 3 months or as needed.  Physical exam due after July 14, 2018

## 2017-12-14 ENCOUNTER — Ambulatory Visit
Admission: RE | Admit: 2017-12-14 | Discharge: 2017-12-14 | Disposition: A | Discharge status: Home / Self Care | Payer: Self-pay | Source: Ambulatory Visit | Attending: Anesthesiology | Admitting: Anesthesiology

## 2017-12-14 ENCOUNTER — Other Ambulatory Visit: Payer: Self-pay | Admitting: Anesthesiology

## 2017-12-14 DIAGNOSIS — M4316 Spondylolisthesis, lumbar region: Secondary | ICD-10-CM

## 2018-01-28 ENCOUNTER — Other Ambulatory Visit: Payer: Self-pay | Admitting: Internal Medicine

## 2018-01-28 DIAGNOSIS — Z1231 Encounter for screening mammogram for malignant neoplasm of breast: Secondary | ICD-10-CM

## 2018-02-25 ENCOUNTER — Ambulatory Visit: Payer: Medicare Other

## 2018-03-15 ENCOUNTER — Telehealth: Payer: Self-pay

## 2018-03-15 DIAGNOSIS — R0600 Dyspnea, unspecified: Secondary | ICD-10-CM

## 2018-03-15 NOTE — Telephone Encounter (Signed)
OK but get CXR in advance of visit

## 2018-03-15 NOTE — Telephone Encounter (Addendum)
Left message scheduled patient for 03/19/18 at 2:45pm, left message to go to GI for chest xray at least an hour before appt here. Patient to call back to confirm appt.

## 2018-03-15 NOTE — Telephone Encounter (Signed)
Patient's husband called states patient would like to be seen she has "trouble breathing" that's been going on for weeks now, she is requesting an appt for Thursday or Friday afternoon okay to schedule?

## 2018-03-18 ENCOUNTER — Ambulatory Visit: Payer: Medicare Other | Admitting: Internal Medicine

## 2018-03-19 ENCOUNTER — Ambulatory Visit
Admission: RE | Admit: 2018-03-19 | Discharge: 2018-03-19 | Disposition: A | Discharge status: Home / Self Care | Payer: Medicare Other | Source: Ambulatory Visit | Attending: Internal Medicine | Admitting: Internal Medicine

## 2018-03-19 ENCOUNTER — Ambulatory Visit (INDEPENDENT_AMBULATORY_CARE_PROVIDER_SITE_OTHER): Payer: Medicare Other | Admitting: Internal Medicine

## 2018-03-19 ENCOUNTER — Encounter: Payer: Self-pay | Admitting: Internal Medicine

## 2018-03-19 VITALS — BP 150/60 | Ht 60.0 in | Wt 102.0 lb

## 2018-03-19 DIAGNOSIS — R0600 Dyspnea, unspecified: Secondary | ICD-10-CM

## 2018-03-19 DIAGNOSIS — Z9889 Other specified postprocedural states: Secondary | ICD-10-CM

## 2018-03-19 DIAGNOSIS — M545 Low back pain: Secondary | ICD-10-CM | POA: Diagnosis not present

## 2018-03-19 DIAGNOSIS — G8929 Other chronic pain: Secondary | ICD-10-CM

## 2018-03-19 DIAGNOSIS — Z8711 Personal history of peptic ulcer disease: Secondary | ICD-10-CM | POA: Diagnosis not present

## 2018-03-19 DIAGNOSIS — J449 Chronic obstructive pulmonary disease, unspecified: Secondary | ICD-10-CM

## 2018-03-19 DIAGNOSIS — E441 Mild protein-calorie malnutrition: Secondary | ICD-10-CM | POA: Diagnosis not present

## 2018-03-19 DIAGNOSIS — Z681 Body mass index (BMI) 19 or less, adult: Secondary | ICD-10-CM

## 2018-03-19 MED ORDER — ALBUTEROL SULFATE HFA 108 (90 BASE) MCG/ACT IN AERS: INHALATION_SPRAY | RESPIRATORY_TRACT | 3 refills | Status: DC

## 2018-03-19 MED ORDER — BUDESONIDE-FORMOTEROL FUMARATE 160-4.5 MCG/ACT IN AERO: 2.0000 | INHALATION_SPRAY | Freq: Two times a day (BID) | RESPIRATORY_TRACT | 3 refills | Status: DC

## 2018-03-24 NOTE — Progress Notes (Signed)
   Subjective:    Patient ID: Katherine [name], female    DOB: Sep 12, 1928, 85 y.o.   MRN: [id number]  HPI She is hear today complaining of dyspnea. History of COPD. Also complaining of lack of appetite and no energy. CXR ordered shows COPD and post surgical changes in lumbar spine.In 2016, she weighed 116 pounds.After back surgery in November 2016, weight dropped to 106 pounds.Had surgical hardware removed by neurosurgeon from back June 2017 after that her weight was 105 pounds in October 2017.In Feb 2018, weight was 108 pounds. She is seeing chronic pain management physician for chronic back pain. Has NOT smoked in many years. Cannot do much housework due to back pain. Husband helps. Is in almost constant pain unless taking narcotic pain med which wears off after about 4 hours.LS spine films in July show fusion at L3-L4 with grade 1 anterior slip at L4. Has interbody spacer L4-L5 ith 52mm anterior slip at L4. In March, weigh decreased to 102 pounds.Weighs 102 pounds today. May be a combination of chronic pain and worsening of COPD. She is being referred to Pulmonologist.Had Rheumatology studies in March which showed positive ANA titer of 1:160 homogeneous pattern. CCP was negative and sed rate was 9.    Review of Systems see above     Objective:   Physical Exam Neck supple without JVD thyromegaly or bruits. Chest clear. She is a petite lady but looks frail. BMI is 19.92  Cor RRR Extremities without edema       Assessment & Plan:  Lack of appetite  Chronic low back pain  Remote history of smoking  COPD-complained of dyspnea but has normal pulse ox resting on room air.  Her blood pressure is elevated today.  Pulse oximetry falls to 93% with walking down the hall.  Plan: Her BMI is 19.92.  We are starting her on Megace 1 teaspoon 4 times a day with plans to recheck her weight in 4 weeks.  Referral to pulmonologist regarding COPD and dyspnea

## 2018-03-24 NOTE — Patient Instructions (Addendum)
To see pulmonologist re: shortness of breath. Trial of Megace one tsp 4 times a day to stimulate appetite

## 2018-03-25 ENCOUNTER — Ambulatory Visit: Payer: Medicare Other

## 2018-04-20 ENCOUNTER — Ambulatory Visit (INDEPENDENT_AMBULATORY_CARE_PROVIDER_SITE_OTHER): Payer: Medicare Other | Admitting: Internal Medicine

## 2018-04-20 ENCOUNTER — Telehealth: Payer: Self-pay | Admitting: Internal Medicine

## 2018-04-20 VITALS — BP 130/78 | HR 87 | Temp 98.0°F | Ht 60.0 in | Wt 104.0 lb

## 2018-04-20 DIAGNOSIS — R634 Abnormal weight loss: Secondary | ICD-10-CM | POA: Diagnosis not present

## 2018-04-20 DIAGNOSIS — M545 Low back pain, unspecified: Secondary | ICD-10-CM

## 2018-04-20 DIAGNOSIS — G8929 Other chronic pain: Secondary | ICD-10-CM

## 2018-04-20 DIAGNOSIS — R0600 Dyspnea, unspecified: Secondary | ICD-10-CM | POA: Diagnosis not present

## 2018-04-20 DIAGNOSIS — J449 Chronic obstructive pulmonary disease, unspecified: Secondary | ICD-10-CM

## 2018-04-20 MED ORDER — ATORVASTATIN CALCIUM 10 MG PO TABS: ORAL_TABLET | ORAL | 3 refills | Status: DC

## 2018-04-20 MED ORDER — PREDNISONE 10 MG PO TABS: 10.0000 mg | ORAL_TABLET | Freq: Every day | ORAL | 0 refills | Status: DC

## 2018-04-20 NOTE — Telephone Encounter (Signed)
Prescription was ordered early October with 3 refills. One cannister designed to last 30 days 2 sprays 4 times a day. May have to pay out of pocket if using too frequently. They may need to call and see if appt with Dr. Lamonte Sakai can be moved up. We are placing her on Prednisone 10 mg daily to see if it helps SOB

## 2018-04-20 NOTE — Telephone Encounter (Signed)
Patient came back in and said that she forgot to mention to you that she is going to need a [facility] [address]  And, she cannot refill it until 11/16.  And, she said there's no way that she can make it until 11/16.  Wants to know what you would suggest that she do until then?    [facility] [address]  Thank you.

## 2018-04-21 ENCOUNTER — Ambulatory Visit
Admission: RE | Admit: 2018-04-21 | Discharge: 2018-04-21 | Disposition: A | Discharge status: Home / Self Care | Payer: Medicare Other | Source: Ambulatory Visit | Attending: Internal Medicine | Admitting: Internal Medicine

## 2018-04-21 DIAGNOSIS — Z1231 Encounter for screening mammogram for malignant neoplasm of breast: Secondary | ICD-10-CM

## 2018-04-21 NOTE — Telephone Encounter (Signed)
Spoke with patient about the frequency of taking her Albuterol.  She said that she knows she's not supposed to take it that often, but she HAS to on some days.  Explained that insurance will not allow [place] to refill that medication sooner.  Told her that she could possibly pay out of pocket until her insurance would allow for the refill.  She said that she is doing the breathing treatments and they are helping tremendously.  And, she started the Prednisone this morning.    I also told her to please feel free to contact Dr. Agustina Caroli office and see if they have had any cancellations whereby she may get in to see Dr. Lamonte Sakai prior to December appointment.    Patient verbalized understanding of our conversation.

## 2018-05-15 ENCOUNTER — Encounter: Payer: Self-pay | Admitting: Internal Medicine

## 2018-05-15 NOTE — Progress Notes (Signed)
   Subjective:    Patient ID: Katherine [name], female    DOB: [birth year], 84 y.o.   MRN: [id number]  HPI 83 year old Female in today for weight check.  In October was concerned about her loss of weight.  At that visit weight 102 pounds.  Previously weight 116 pounds in 2016 before her back surgery.  In February 2018 weight was 108 pounds.  She does see chronic pain management physician for chronic back pain.    Review of Systems not able to do a lot of physical activity due to back pain.  Husband helps around the house.  Complaining of shortness of breath.  Pulse oximetry is 93% and drops even further with exercise.  Pulmonary referral has been made.  Has appointment early December.  I have placed her on prednisone 10 mg daily until she can see pulmonologist number 30 tablets with no refill for COPD.  She has Symbicort and Pro-air inhalers.  She sees chronic pain management physician at [facility].  Has chronic low back pain after her surgery that never improved despite removal of hardware.  She and her husband are active participants in Alcoholics Anonymous and are well-known in the community as excellent speakers and mentors.  She has been in recovery for many many years.  She lost a sister to alcoholism.  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     Objective:   Physical Exam Weight today has improved to 104 pounds which is encouraging.  BMI is 20.31 Skin is warm and dry.  Nodes none.  Chest clear to auscultation without rales or wheezing.  Cardiac exam regular rate and rhythm.  Extremities without edema.      Assessment & Plan:  My impression is that her COPD is rather severe and this is contributing to her weight loss.  She will take prednisone 10 mg daily.  I had previously prescribed Megace for her to help with her appetite.  The prednisone will certainly help her appetite.  She has an appointment with pulmonary early December.  I  will follow-up with her weight in about 3 months.

## 2018-05-15 NOTE — Patient Instructions (Signed)
Prednisone 10 mg daily until you can see pulmonologist.  Continue pro-air and Symbicort inhalers.

## 2018-05-19 ENCOUNTER — Ambulatory Visit: Payer: Medicare Other | Admitting: Emergency Medicine

## 2018-05-19 ENCOUNTER — Telehealth: Payer: Self-pay | Admitting: Emergency Medicine

## 2018-05-19 ENCOUNTER — Encounter: Payer: Self-pay | Admitting: Emergency Medicine

## 2018-05-19 DIAGNOSIS — J449 Chronic obstructive pulmonary disease, unspecified: Secondary | ICD-10-CM

## 2018-05-19 MED ORDER — UMECLIDINIUM-VILANTEROL 62.5-25 MCG/INH IN AEPB: 1.0000 | INHALATION_SPRAY | Freq: Every day | RESPIRATORY_TRACT | 2 refills | Status: DC

## 2018-05-19 NOTE — Telephone Encounter (Signed)
OK thank you 

## 2018-05-19 NOTE — Addendum Note (Signed)
Addended by: Della Goo C on: 05/19/2018 03:05 PM   Modules accepted: Orders

## 2018-05-19 NOTE — Telephone Encounter (Signed)
ATC patient at given- no answer and unable to leave message stating we rec'd message about his medications. Will forward message to RB and Ria Comment as FYI.

## 2018-05-19 NOTE — Assessment & Plan Note (Signed)
Suspect that she does have significant and progressive COPD based on her chest x-ray, clinical findings, response to bronchodilator.  She needs full pulmonary function testing to determine her degree of obstruction.  I think that we can expand her bronchodilator regimen to include LAMA.  She is currently on prednisone and hopefully we will be able to manage her with bronchodilators adequately and allow her to come off of this.  She needs walking oximetry to look for occult desaturation.  Please continue your prednisone as you are taking it for now.  Please temporarily stop Symbicort We will try Anoro 1 inhalation once daily Keep albuterol available to use 2 puffs up to every 4 hours if needed for shortness of breath, chest tightness, wheezing.  Call to let us know which nebulized medication you have at home.  We will perform walking oximetry on room air  We will perform full pulmonary function testing at your next office visit Follow with Dr Lamonte Sakai in 1 month with full PFT on the same day

## 2018-05-19 NOTE — Progress Notes (Signed)
Subjective:    Patient ID: Katherine [name], female    DOB: [birth year], 84 y.o.   MRN: [id number]  HPI 84 year old former smoker (64 - 100 pack years) with a history of irritable bowel, GERD with PUD and hiatal hernia, arthritis with chronic low back pain, positive PPD (not treated), recovered alcoholic.  She carries a history of COPD that was made over 20 yrs ago. She has been on BD's for about 5 yrs.  She is currently on Symbicort (added 3-4 mo ago), has albuterol that she now uses approximately 3-4x a day. She has a nebulizer, ? Which med is going in it.  She is also on prednisone 10 mg daily for COPD and also for malnutrition which is felt to be associated to her obstructive lung disease. She has experienced progressive exertional SOB, now when she walks across the room. No real cough, no wheeze. She has had some increased hoarseness over the last month. She occassionally feels some chest pressure.  She has chronic rhinitis, no GERD sx.    Review of Systems  Constitutional: Positive for activity change, fatigue and unexpected weight change.  HENT: Positive for postnasal drip and voice change.   Eyes: Negative.   Respiratory: Positive for shortness of breath.   Cardiovascular: Negative.   Gastrointestinal: Negative.   Endocrine: Negative.   Genitourinary: Negative.   Musculoskeletal: Positive for back pain and joint swelling.  Skin: Negative.   Allergic/Immunologic: Negative.   Neurological: Positive for weakness.  Hematological: Negative.   Psychiatric/Behavioral: Negative.     Past Medical History:  Diagnosis Date  . Alcoholic ([place])    36 years sober  . Allergy   . Anemia   . Arthritis   . Asthma    patient denies  . B12 deficiency   . COPD (chronic obstructive pulmonary disease) ([place])   . Depression    not recent  . Diverticulosis   . Esophageal stricture   . Headache    cluster headaches occasionally  . Hepatic cyst 01/30/10  . Hiatal hernia 1985   patient  unaware  . Hx of adenomatous colonic polyps 2006  . Hyperlipidemia   . IBS (irritable bowel syndrome)   . PONV (postoperative nausea and vomiting)    no nausea or vomitting after surgery 07/2014  . Positive PPD   . PUD (peptic ulcer disease)   . Shortness of breath dyspnea    SOB WITH EXERTION   (NOT NEW)     Family History  Problem Relation Age of Onset  . Ulcers Father   . Stroke Father   . Irritable bowel syndrome Father   . Ovarian cancer Sister   . Colon cancer Neg Hx      Social History   Socioeconomic History  . Marital status: Married    Spouse name: Not on file  . Number of children: 2  . Years of education: Not on file  . Highest education level: Not on file  Occupational History    Employer: RETIRED  Social Needs  . Financial resource strain: Not on file  . Food insecurity:    Worry: Not on file    Inability: Not on file  . Transportation needs:    Medical: Not on file    Non-medical: Not on file  Tobacco Use  . Smoking status: Former Smoker    Years: 50.00    Last attempt to quit: 02/02/2010    Years since quitting: 8.2  . Smokeless tobacco: Never Used  Substance  and Sexual Activity  . Alcohol use: No    Comment: recovering alcoholic     35 YRS    . Drug use: No  . Sexual activity: Not on file  Lifestyle  . Physical activity:    Days per week: Not on file    Minutes per session: Not on file  . Stress: Not on file  Relationships  . Social connections:    Talks on phone: Not on file    Gets together: Not on file    Attends religious service: Not on file    Active member of club or organization: Not on file    Attends meetings of clubs or organizations: Not on file    Relationship status: Not on file  . Intimate partner violence:    Fear of current or ex partner: Not on file    Emotionally abused: Not on file    Physically abused: Not on file    Forced sexual activity: Not on file  Other Topics Concern  . Not on file  Social History  Narrative   Daily caffeine    she has worked at [employer] Has lived in [place], [place], [place]    Allergies  Allergen Reactions  . Aspirin Other (See Comments)    Hx of ulcers  . Ibuprofen Nausea Only     Outpatient Medications Prior to Visit  Medication Sig Dispense Refill  . albuterol (PROAIR HFA) 108 (90 Base) MCG/ACT inhaler 2 sprays po 4 times a day as needed 8.5 g 3  . ALPRAZolam (XANAX) 0.25 MG tablet Take 1 tablet (0.25 mg total) by mouth 2 (two) times daily as needed for anxiety. 30 tablet 1  . atorvastatin (LIPITOR) 10 MG tablet TAKE 1 TABLET (10 MG TOTAL) BY MOUTH DAILY. 90 tablet 3  . B Complex Vitamins (B-COMPLEX/B-12 TR PO) Take 1 tablet by mouth daily.    . budesonide-formoterol (SYMBICORT) 160-4.5 MCG/ACT inhaler Inhale 2 puffs into the lungs 2 (two) times daily. 1 Inhaler 3  . cholecalciferol (VITAMIN D) 1000 UNITS tablet Take 1,000 Units by mouth daily.    Marland Kitchen PATADAY 0.2 % SOLN Place 1 drop into both eyes daily as needed (for dry, allergy eyes).   4  . predniSONE (DELTASONE) 10 MG tablet Take 1 tablet (10 mg total) by mouth daily with breakfast. 30 tablet 0  . promethazine (PHENERGAN) 12.5 MG tablet Take 1 tablet (12.5 mg total) by mouth every 8 (eight) hours as needed for nausea or vomiting. 20 tablet 2  . traZODone (DESYREL) 50 MG tablet TAKE 3 TABLETS BY MOUTH AT BEDTIME. 270 tablet 1  . VIIBRYD 40 MG TABS TAKE 1 TABLET (40 MG TOTAL) BY MOUTH DAILY. 30 tablet 5  . VITAMIN E PO Take 1 tablet by mouth daily.    Marland Kitchen oxyCODONE-acetaminophen (PERCOCET) 10-325 MG tablet Take 1 tablet by mouth every 6 (six) hours as needed for pain. 120 tablet 0   No facility-administered medications prior to visit.         Objective:   Physical Exam Vitals:   05/19/18 1419  BP: 118/70  Pulse: 89  SpO2: 98%   Gen: Pleasant, thin elderly woman, in no distress,  normal affect  ENT: No lesions,  mouth clear,  oropharynx clear, no postnasal drip  Neck: No JVD,  no stridor  Lungs: No use of accessory muscles, distant, no crackles or wheezing on normal respiration, no wheeze on forced expiration  Cardiovascular: RRR, holosystolic murmur  Musculoskeletal:  No deformities, no cyanosis or clubbing  Neuro: alert, awake, non focal  Skin: Warm, no lesions or rash      Assessment & Plan:  COPD (chronic obstructive pulmonary disease) Suspect that she does have significant and progressive COPD based on her chest x-ray, clinical findings, response to bronchodilator.  She needs full pulmonary function testing to determine her degree of obstruction.  I think that we can expand her bronchodilator regimen to include LAMA.  She is currently on prednisone and hopefully we will be able to manage her with bronchodilators adequately and allow her to come off of this.  She needs walking oximetry to look for occult desaturation.  Please continue your prednisone as you are taking it for now.  Please temporarily stop Symbicort We will try Anoro 1 inhalation once daily Keep albuterol available to use 2 puffs up to every 4 hours if needed for shortness of breath, chest tightness, wheezing.  Call to let us know which nebulized medication you have at home.  We will perform walking oximetry on room air  We will perform full pulmonary function testing at your next office visit Follow with Dr Lamonte Sakai in 1 month with full PFT on the same day  Baltazar Apo, MD, PhD 05/19/2018, 2:55 PM [facility] [phone] or if no answer [phone]

## 2018-05-19 NOTE — Addendum Note (Signed)
Addended by: Della Goo C on: 05/19/2018 03:04 PM   Modules accepted: Orders

## 2018-05-19 NOTE — Patient Instructions (Addendum)
Please continue your prednisone as you are taking it for now.  Please temporarily stop Symbicort We will try Anoro 1 inhalation once daily Keep albuterol available to use 2 puffs up to every 4 hours if needed for shortness of breath, chest tightness, wheezing.  Call to let us know which nebulized medication you have at home.  We will perform walking oximetry on room air  We will perform full pulmonary function testing at your next office visit Follow with Dr Lamonte Sakai in 1 month with full PFT on the same day

## 2018-06-17 ENCOUNTER — Other Ambulatory Visit: Payer: Self-pay | Admitting: Internal Medicine

## 2018-06-22 ENCOUNTER — Ambulatory Visit (INDEPENDENT_AMBULATORY_CARE_PROVIDER_SITE_OTHER): Payer: Medicare Other | Admitting: Emergency Medicine

## 2018-06-22 ENCOUNTER — Encounter: Payer: Self-pay | Admitting: Emergency Medicine

## 2018-06-22 ENCOUNTER — Ambulatory Visit: Payer: Medicare Other | Admitting: Emergency Medicine

## 2018-06-22 DIAGNOSIS — J449 Chronic obstructive pulmonary disease, unspecified: Secondary | ICD-10-CM

## 2018-06-22 LAB — PULMONARY FUNCTION TEST
DL/VA % PRED: 40 %
DL/VA: 1.78 ml/min/mmHg/L
DLCO unc % pred: 25 %
DLCO unc: 5.14 ml/min/mmHg
FEF 25-75 Post: 0.44 L/sec
FEF 25-75 Pre: 0.41 L/sec
FEF2575-%Change-Post: 7 %
FEF2575-%Pred-Post: 49 %
FEF2575-%Pred-Pre: 46 %
FEV1-%Change-Post: 3 %
FEV1-%PRED-PRE: 66 %
FEV1-%Pred-Post: 68 %
FEV1-Post: 0.97 L
FEV1-Pre: 0.94 L
FEV1FVC-%Change-Post: 2 %
FEV1FVC-%PRED-PRE: 75 %
FEV6-%Change-Post: 0 %
FEV6-%PRED-POST: 94 %
FEV6-%Pred-Pre: 94 %
FEV6-POST: 1.71 L
FEV6-Pre: 1.7 L
FEV6FVC-%CHANGE-POST: 0 %
FEV6FVC-%Pred-Post: 106 %
FEV6FVC-%Pred-Pre: 106 %
FVC-%Change-Post: 0 %
FVC-%Pred-Post: 89 %
FVC-%Pred-Pre: 89 %
FVC-PRE: 1.72 L
FVC-Post: 1.74 L
PRE FEV6/FVC RATIO: 99 %
Post FEV1/FVC ratio: 56 %
Post FEV6/FVC ratio: 99 %
Pre FEV1/FVC ratio: 54 %
RV % pred: 131 %
RV: 3.12 L
TLC % pred: 106 %
TLC: 4.91 L

## 2018-06-22 MED ORDER — TIOTROPIUM BROMIDE-OLODATEROL 2.5-2.5 MCG/ACT IN AERS: 2.0000 | INHALATION_SPRAY | Freq: Every day | RESPIRATORY_TRACT | 0 refills | Status: DC

## 2018-06-22 NOTE — Assessment & Plan Note (Signed)
Pulmonary function testing consistent with moderately severe obstruction.  She reports that she got a significant benefit from the Anoro, has decreased her albuterol use significantly.  The 1 problematic side effect is she is having more hoarseness.  We will try changing the Anoro to Stiolto to see if she tolerates it better, get the same benefit.  She will then call us to let us know which 1 were going to continue long-term.  We will order for her through her pharmacy.  She wants a nebulizer machine so she can continue to have DuoNeb available to use as needed  Stop Anoro temporarily. We will try Stiolto 2 puffs once daily to see if this helps your breathing and also improves your hoarseness.   Please call us to let us know which of the 2 medications, Anoro or Stiolto, we are going to stay with.  We will then call your maintenance inhaler to your pharmacy. Keep your albuterol available to use 2 puffs up to every 4 hours if needed for shortness of breath Keep your DuoNeb available to use up to every 6 hours if you needed for shortness of breath.  We will order a nebulizer machine for you. Flu shot up-to-date Follow with Dr Lamonte Sakai in 4 months or sooner if you have any problems.

## 2018-06-22 NOTE — Patient Instructions (Signed)
Stop Anoro temporarily. We will try Stiolto 2 puffs once daily to see if this helps your breathing and also improves your hoarseness.   Please call us to let us know which of the 2 medications, Anoro or Stiolto, we are going to stay with.  We will then call your maintenance inhaler to your pharmacy. Keep your albuterol available to use 2 puffs up to every 4 hours if needed for shortness of breath Keep your DuoNeb available to use up to every 6 hours if you needed for shortness of breath.  We will order a nebulizer machine for you. Flu shot up-to-date Follow with Dr Lamonte Sakai in 4 months or sooner if you have any problems.

## 2018-06-22 NOTE — Progress Notes (Signed)
Subjective:    Patient ID: Katherine [name], female    DOB: 10/24/1928, 85 y.o.   MRN: [id number]  HPI 85 year old former smoker (43 - 100 pack years) with a history of irritable bowel, GERD with PUD and hiatal hernia, arthritis with chronic low back pain, positive PPD (not treated), recovered alcoholic.  She carries a history of COPD that was made over 20 yrs ago. She has been on BD's for about 5 yrs.  She is currently on Symbicort (added 3-4 mo ago), has albuterol that she now uses approximately 3-4x a day. She has a nebulizer, ? Which med is going in it.  She is also on prednisone 10 mg daily for COPD and also for malnutrition which is felt to be associated to her obstructive lung disease. She has experienced progressive exertional SOB, now when she walks across the room. No real cough, no wheeze. She has had some increased hoarseness over the last month. She occassionally feels some chest pressure.  She has chronic rhinitis, no GERD sx.   ROV 06/22/18 --follow-up visit for presumed COPD, also has a history of positive PPD (no treatment).  She has been on prednisone 10 mg daily.  We started a trial of Anoro at her last visit to see if she would benefit.  She notes significant benefit, much less albuterol use. She is having some hoarseness. Has DuoNeb available, uses a few times a week. Pulmonary function testing was done today which I have reviewed, as below. Did not desaturate last time w ambulation. Flu shot up to date.    06/22/18:  FEV1 0.94 (66) FVC 1.72 (89) Ratio 54% RV 131% TLC 106% DLCO/VA 40 % pred   Review of Systems  Constitutional: Positive for activity change, fatigue and unexpected weight change.  HENT: Positive for postnasal drip and voice change.   Eyes: Negative.   Respiratory: Positive for shortness of breath.   Cardiovascular: Negative.   Gastrointestinal: Negative.   Endocrine: Negative.   Genitourinary: Negative.   Musculoskeletal: Positive for back pain and  joint swelling.  Skin: Negative.   Allergic/Immunologic: Negative.   Neurological: Positive for weakness.  Hematological: Negative.   Psychiatric/Behavioral: Negative.     Past Medical History:  Diagnosis Date  . Alcoholic ([place])    36 years sober  . Allergy   . Anemia   . Arthritis   . Asthma    patient denies  . B12 deficiency   . COPD (chronic obstructive pulmonary disease) ([place])   . Depression    not recent  . Diverticulosis   . Esophageal stricture   . Headache    cluster headaches occasionally  . Hepatic cyst 01/30/10  . Hiatal hernia 1985   patient unaware  . Hx of adenomatous colonic polyps 2006  . Hyperlipidemia   . IBS (irritable bowel syndrome)   . PONV (postoperative nausea and vomiting)    no nausea or vomitting after surgery 07/2014  . Positive PPD   . PUD (peptic ulcer disease)   . Shortness of breath dyspnea    SOB WITH EXERTION   (NOT NEW)     Family History  Problem Relation Age of Onset  . Ulcers Father   . Stroke Father   . Irritable bowel syndrome Father   . Ovarian cancer Sister   . Colon cancer Neg Hx      Social History   Socioeconomic History  . Marital status: Married    Spouse name: Not on file  . Number  of children: 2  . Years of education: Not on file  . Highest education level: Not on file  Occupational History    Employer: RETIRED  Social Needs  . Financial resource strain: Not on file  . Food insecurity:    Worry: Not on file    Inability: Not on file  . Transportation needs:    Medical: Not on file    Non-medical: Not on file  Tobacco Use  . Smoking status: Former Smoker    Years: 60.00    Types: Cigarettes    Last attempt to quit: 02/02/2010    Years since quitting: 8.3  . Smokeless tobacco: Never Used  Substance and Sexual Activity  . Alcohol use: No    Comment: recovering alcoholic     35 YRS    . Drug use: No  . Sexual activity: Not on file  Lifestyle  . Physical activity:    Days per week: Not on file     Minutes per session: Not on file  . Stress: Not on file  Relationships  . Social connections:    Talks on phone: Not on file    Gets together: Not on file    Attends religious service: Not on file    Active member of club or organization: Not on file    Attends meetings of clubs or organizations: Not on file    Relationship status: Not on file  . Intimate partner violence:    Fear of current or ex partner: Not on file    Emotionally abused: Not on file    Physically abused: Not on file    Forced sexual activity: Not on file  Other Topics Concern  . Not on file  Social History Narrative   Daily caffeine    she has worked at [employer] lived in [place], [place], [place]    Allergies  Allergen Reactions  . Aspirin Other (See Comments)    Hx of ulcers  . Ibuprofen Nausea Only     Outpatient Medications Prior to Visit  Medication Sig Dispense Refill  . albuterol (PROAIR HFA) 108 (90 Base) MCG/ACT inhaler 2 sprays po 4 times a day as needed 8.5 g 3  . ALPRAZolam (XANAX) 0.25 MG tablet Take 1 tablet (0.25 mg total) by mouth 2 (two) times daily as needed for anxiety. 30 tablet 1  . atorvastatin (LIPITOR) 10 MG tablet TAKE 1 TABLET (10 MG TOTAL) BY MOUTH DAILY. 90 tablet 3  . B Complex Vitamins (B-COMPLEX/B-12 TR PO) Take 1 tablet by mouth daily.    . budesonide-formoterol (SYMBICORT) 160-4.5 MCG/ACT inhaler Inhale 2 puffs into the lungs 2 (two) times daily. 1 Inhaler 3  . cholecalciferol (VITAMIN D) 1000 UNITS tablet Take 1,000 Units by mouth daily.    Marland Kitchen PATADAY 0.2 % SOLN Place 1 drop into both eyes daily as needed (for dry, allergy eyes).   4  . predniSONE (DELTASONE) 10 MG tablet Take 1 tablet (10 mg total) by mouth daily with breakfast. 30 tablet 0  . promethazine (PHENERGAN) 12.5 MG tablet Take 1 tablet (12.5 mg total) by mouth every 8 (eight) hours as needed for nausea or vomiting. 20 tablet 2  . traZODone (DESYREL) 50 MG tablet TAKE 3 TABLETS BY MOUTH AT  BEDTIME. 270 tablet 1  . umeclidinium-vilanterol (ANORO ELLIPTA) 62.5-25 MCG/INH AEPB Inhale 1 puff into the lungs daily. 1 each 2  . VIIBRYD 40 MG TABS TAKE 1 TABLET (40 MG TOTAL) BY MOUTH  DAILY. 30 tablet 5  . VITAMIN E PO Take 1 tablet by mouth daily.     No facility-administered medications prior to visit.         Objective:   Physical Exam Vitals:   06/22/18 1140  BP: 98/70  Pulse: 94  SpO2: 98%  Weight: 100 lb (45.4 kg)  Height: 5\' 1"  (1.549 m)   Gen: Pleasant, thin elderly woman, in no distress,  normal affect  ENT: No lesions,  mouth clear,  oropharynx clear, no postnasal drip  Neck: No JVD, no stridor  Lungs: No use of accessory muscles, distant, no crackles or wheezing on normal respiration, no wheeze on forced expiration  Cardiovascular: RRR, holosystolic murmur  Musculoskeletal: No deformities, no cyanosis or clubbing  Neuro: alert, awake, non focal  Skin: Warm, no lesions or rash      Assessment & Plan:  COPD (chronic obstructive pulmonary disease) Pulmonary function testing consistent with moderately severe obstruction.  She reports that she got a significant benefit from the Anoro, has decreased her albuterol use significantly.  The 1 problematic side effect is she is having more hoarseness.  We will try changing the Anoro to Stiolto to see if she tolerates it better, get the same benefit.  She will then call us to let us know which 1 were going to continue long-term.  We will order for her through her pharmacy.  She wants a nebulizer machine so she can continue to have DuoNeb available to use as needed  Stop Anoro temporarily. We will try Stiolto 2 puffs once daily to see if this helps your breathing and also improves your hoarseness.   Please call us to let us know which of the 2 medications, Anoro or Stiolto, we are going to stay with.  We will then call your maintenance inhaler to your pharmacy. Keep your albuterol available to use 2 puffs up to every 4  hours if needed for shortness of breath Keep your DuoNeb available to use up to every 6 hours if you needed for shortness of breath.  We will order a nebulizer machine for you. Flu shot up-to-date Follow with Dr Lamonte Sakai in 4 months or sooner if you have any problems.  Baltazar Apo, MD, PhD 06/22/2018, 12:40 PM [facility] [phone] or if no answer [phone]

## 2018-06-22 NOTE — Addendum Note (Signed)
Addended by: Desmond Dike C on: 06/22/2018 02:04 PM   Modules accepted: Orders

## 2018-06-22 NOTE — Progress Notes (Signed)
Patient seen in the office today and instructed on use of Stiolto Respimat.  Patient expressed understanding and demonstrated technique.  

## 2018-06-22 NOTE — Progress Notes (Signed)
Patient completed full PFT today. 

## 2018-07-22 ENCOUNTER — Other Ambulatory Visit: Payer: Medicare Other | Admitting: Internal Medicine

## 2018-07-22 DIAGNOSIS — J440 Chronic obstructive pulmonary disease with acute lower respiratory infection: Secondary | ICD-10-CM

## 2018-07-22 DIAGNOSIS — J209 Acute bronchitis, unspecified: Secondary | ICD-10-CM

## 2018-07-22 DIAGNOSIS — F3289 Other specified depressive episodes: Secondary | ICD-10-CM

## 2018-07-22 DIAGNOSIS — E782 Mixed hyperlipidemia: Secondary | ICD-10-CM

## 2018-07-22 DIAGNOSIS — M545 Low back pain: Secondary | ICD-10-CM

## 2018-07-22 DIAGNOSIS — M48061 Spinal stenosis, lumbar region without neurogenic claudication: Secondary | ICD-10-CM

## 2018-07-22 DIAGNOSIS — K219 Gastro-esophageal reflux disease without esophagitis: Secondary | ICD-10-CM

## 2018-07-22 DIAGNOSIS — I350 Nonrheumatic aortic (valve) stenosis: Secondary | ICD-10-CM

## 2018-07-22 DIAGNOSIS — R011 Cardiac murmur, unspecified: Secondary | ICD-10-CM

## 2018-07-22 DIAGNOSIS — G8929 Other chronic pain: Secondary | ICD-10-CM

## 2018-07-22 DIAGNOSIS — Z0000 Encounter for general adult medical examination without abnormal findings: Secondary | ICD-10-CM

## 2018-07-22 DIAGNOSIS — M4316 Spondylolisthesis, lumbar region: Secondary | ICD-10-CM

## 2018-07-22 LAB — COMPLETE METABOLIC PANEL WITH GFR
AG Ratio: 1.5 (calc) (ref 1.0–2.5)
ALT: 19 U/L (ref 6–29)
AST: 31 U/L (ref 10–35)
Albumin: 3.5 g/dL — ABNORMAL LOW (ref 3.6–5.1)
Alkaline phosphatase (APISO): 78 U/L (ref 37–153)
BUN/Creatinine Ratio: 17 (calc) (ref 6–22)
BUN: 18 mg/dL (ref 7–25)
CHLORIDE: 106 mmol/L (ref 98–110)
CO2: 30 mmol/L (ref 20–32)
Calcium: 9.2 mg/dL (ref 8.6–10.4)
Creat: 1.05 mg/dL — ABNORMAL HIGH (ref 0.60–0.88)
GFR, EST AFRICAN AMERICAN: 55 mL/min/{1.73_m2} — AB (ref >=60)
GFR, Est Non African American: 47 mL/min/{1.73_m2} — ABNORMAL LOW (ref >=60)
Globulin: 2.4 g/dL (calc) (ref 1.9–3.7)
Glucose, Bld: 83 mg/dL (ref 65–99)
Potassium: 4 mmol/L (ref 3.5–5.3)
Sodium: 142 mmol/L (ref 135–146)
Total Bilirubin: 0.3 mg/dL (ref 0.2–1.2)
Total Protein: 5.9 g/dL — ABNORMAL LOW (ref 6.1–8.1)

## 2018-07-22 LAB — LIPID PANEL
Cholesterol: 137 mg/dL (ref <200)
HDL: 53 mg/dL (ref >=50)
LDL Cholesterol (Calc): 65 mg/dL (calc)
Non-HDL Cholesterol (Calc): 84 mg/dL (calc) (ref <130)
Total CHOL/HDL Ratio: 2.6 (calc) (ref <5.0)
Triglycerides: 102 mg/dL (ref <150)

## 2018-07-22 LAB — CBC WITH DIFFERENTIAL/PLATELET
Absolute Monocytes: 607 cells/uL (ref 200–950)
BASOS PCT: 0.7 %
Basophils Absolute: 48 cells/uL (ref 0–200)
Eosinophils Absolute: 221 cells/uL (ref 15–500)
Eosinophils Relative: 3.2 %
HCT: 36 % (ref 35.0–45.0)
Hemoglobin: 11.7 g/dL (ref 11.7–15.5)
Lymphs Abs: 1994 cells/uL (ref 850–3900)
MCH: 28.4 pg (ref 27.0–33.0)
MCHC: 32.5 g/dL (ref 32.0–36.0)
MCV: 87.4 fL (ref 80.0–100.0)
MPV: 11 fL (ref 7.5–12.5)
Monocytes Relative: 8.8 %
Neutro Abs: 4030 cells/uL (ref 1500–7800)
Neutrophils Relative %: 58.4 %
Platelets: 225 10*3/uL (ref 140–400)
RBC: 4.12 10*6/uL (ref 3.80–5.10)
RDW: 14.3 % (ref 11.0–15.0)
TOTAL LYMPHOCYTE: 28.9 %
WBC: 6.9 10*3/uL (ref 3.8–10.8)

## 2018-07-22 LAB — TSH: TSH: 3.77 mIU/L (ref 0.40–4.50)

## 2018-08-03 ENCOUNTER — Encounter: Payer: Self-pay | Admitting: Internal Medicine

## 2018-08-03 ENCOUNTER — Ambulatory Visit (INDEPENDENT_AMBULATORY_CARE_PROVIDER_SITE_OTHER): Payer: Medicare Other | Admitting: Internal Medicine

## 2018-08-03 VITALS — BP 110/72 | HR 77 | Ht 61.0 in | Wt 102.0 lb

## 2018-08-03 DIAGNOSIS — Z0000 Encounter for general adult medical examination without abnormal findings: Secondary | ICD-10-CM

## 2018-08-03 DIAGNOSIS — Z8711 Personal history of peptic ulcer disease: Secondary | ICD-10-CM

## 2018-08-03 DIAGNOSIS — F419 Anxiety disorder, unspecified: Secondary | ICD-10-CM

## 2018-08-03 DIAGNOSIS — Z87891 Personal history of nicotine dependence: Secondary | ICD-10-CM

## 2018-08-03 DIAGNOSIS — R634 Abnormal weight loss: Secondary | ICD-10-CM

## 2018-08-03 DIAGNOSIS — M545 Low back pain, unspecified: Secondary | ICD-10-CM

## 2018-08-03 DIAGNOSIS — Z9889 Other specified postprocedural states: Secondary | ICD-10-CM | POA: Diagnosis not present

## 2018-08-03 DIAGNOSIS — F329 Major depressive disorder, single episode, unspecified: Secondary | ICD-10-CM

## 2018-08-03 DIAGNOSIS — K219 Gastro-esophageal reflux disease without esophagitis: Secondary | ICD-10-CM

## 2018-08-03 DIAGNOSIS — G8929 Other chronic pain: Secondary | ICD-10-CM

## 2018-08-03 DIAGNOSIS — F1021 Alcohol dependence, in remission: Secondary | ICD-10-CM

## 2018-08-03 DIAGNOSIS — J449 Chronic obstructive pulmonary disease, unspecified: Secondary | ICD-10-CM

## 2018-08-03 DIAGNOSIS — I350 Nonrheumatic aortic (valve) stenosis: Secondary | ICD-10-CM

## 2018-08-03 LAB — POCT URINALYSIS DIPSTICK
Appearance: NEGATIVE
BILIRUBIN UA: NEGATIVE
Blood, UA: NEGATIVE
Glucose, UA: NEGATIVE
Ketones, UA: NEGATIVE
Leukocytes, UA: NEGATIVE
Nitrite, UA: NEGATIVE
Odor: NEGATIVE
Protein, UA: NEGATIVE
Spec Grav, UA: 1.01 (ref 1.010–1.025)
Urobilinogen, UA: 0.2 E.U./dL
pH, UA: 6 (ref 5.0–8.0)

## 2018-08-03 MED ORDER — PROMETHAZINE HCL 12.5 MG PO TABS: 12.5000 mg | ORAL_TABLET | Freq: Three times a day (TID) | ORAL | 3 refills | Status: DC | PRN

## 2018-08-03 MED ORDER — ALPRAZOLAM 0.25 MG PO TABS: 0.2500 mg | ORAL_TABLET | Freq: Two times a day (BID) | ORAL | 1 refills | Status: DC | PRN

## 2018-08-03 MED ORDER — MEGESTROL ACETATE 40 MG PO TABS: 40.0000 mg | ORAL_TABLET | Freq: Every day | ORAL | 5 refills | Status: DC

## 2018-08-14 ENCOUNTER — Encounter: Payer: Self-pay | Admitting: Internal Medicine

## 2018-08-14 NOTE — Progress Notes (Signed)
Subjective:    Patient ID: Katherine [name], female    DOB: 30-Mar-1929, 85 y.o.   MRN: [id number]  HPI 85 year old Female in today for Medicare wellness exam, health maintenance exam and evaluation of medical issues including chronic back pain and weight loss.  I am going to start her on Megace.  Weight is 102 pounds.  In June she weighed 104 pounds.  In September 2018 she weighed 105 pounds.  In 2017 she weighed 109 pounds.  It is hard for her to prepare food due to low back pain.  Husband helps and is supportive.  She is on chronic pain management for [facility].  She has constant back pain.  At next visit she will be checked for multiple myeloma with SPEP.  Total protein and albumin slightly low.  TSH normal.  Liver functions are normal.  Cholesterol is normal.  CBC is normal.  She is an alcoholic in recovery for many years.  She does not abuse chronic pain medication.  She does not do much housework anymore.  Her creatinine fluctuates with her state of hydration but is stable.  History of peptic ulcer disease, hyperlipidemia, depression of B12 deficiency.  She is intolerant of aspirin as it causes GI symptoms.  Advil causes nausea.  Past medical history: Appendectomy 1940, hysterectomy with right oophorectomy for endometriosis 1967, vagotomy and pyloroplasty 1975, benign left breast biopsy 1976.  Bartholin cyst excised twice in 1980 and 1982.  History of epidural steroid injections L5-S1 for back pain previously done by Dr. Niel Hummer.  History of needle guided excision right breast calcification in 2008 by Dr. Jennette Bill.  Was found to be anemic and iron deficient in 2014.  Upper GI endoscopy showed stricture with no bleeding.  At that time she had strongly Hemoccult positive stool.  Colonoscopy was incomplete due to tortuous colon.  Anemia improved with iron therapy.  Social history: 2 adult daughters in good health.  She completed high school and 2 years of college.   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  Has smoked for over 40 years.  She and her husband are very active in Alcoholics Anonymous.  This is her second marriage.  No children from second marriage.  She has 3 stepchildren.  Family history: Father died at age 52 with uremia.  Mother died at age 20.  One sister died at age 61 with complications of alcoholism.  Another sister died at age 22 because of alcoholism and cancer.  In February 2016 she had posterior lumbar interbody fusion at L4-L5.  In October 2016 she had transforaminal lumbar interbody fusion L3-L4 and posterior lumbar fusion L3-S1.  In June 2017 she had removal of hardware L5-S1.    Review of Systems chronic back pain and decreased appetite     Objective:   Physical Exam Vitals signs reviewed.  Constitutional:      Appearance: Normal appearance. She is not diaphoretic.     Comments: Thin and frail  HENT:     Head: Normocephalic and atraumatic.     Right Ear: Tympanic membrane normal.     Left Ear: Tympanic membrane normal.     Nose: Nose normal.     Mouth/Throat:     Mouth: Mucous membranes are moist.     Pharynx: Oropharynx is clear.  Eyes:     General: No scleral icterus.    Conjunctiva/sclera: Conjunctivae normal.     Pupils: Pupils are equal, round, and reactive to light.  Neck:  Musculoskeletal: Neck supple.     Comments: Transmitted murmur bilaterally in carotids Cardiovascular:     Rate and Rhythm: Normal rate and regular rhythm.     Pulses: Normal pulses.     Heart sounds: No murmur.     Comments: Murmur present Pulmonary:     Effort: Pulmonary effort is normal.     Breath sounds: Normal breath sounds.  Abdominal:     General: Bowel sounds are normal.     Palpations: Abdomen is soft. There is no mass.     Tenderness: There is no abdominal tenderness. There is no guarding.  Genitourinary:    Comments: Deferred Musculoskeletal:     Right lower leg: No edema.     Left lower leg: No  edema.  Lymphadenopathy:     Cervical: No cervical adenopathy.  Skin:    General: Skin is warm and dry.  Neurological:     General: No focal deficit present.     Mental Status: She is alert and oriented to person, place, and time.     Cranial Nerves: No cranial nerve deficit.     Coordination: Coordination normal.  Psychiatric:        Mood and Affect: Mood normal.        Behavior: Behavior normal.        Thought Content: Thought content normal.        Judgment: Judgment normal.           Assessment & Plan:  Chronic back pain status post 2 back surgeries.  Seeing at pain management.  Has chronic constant back pain requiring narcotic pain medication  Systolic murmur-echo January 2019 shows moderate aortic stenosis with mild regurgitation.  Continue to monitor.  Remote history of alcoholism  History of peptic ulcer disease  Lack of appetite and weight loss-start Megace  Longstanding history of smoking  History of COPD  GE reflux  History of iron deficiency  Status post vagotomy and pyloroplasty  B12 level in 2019 was normal.  Mild elevation of serum creatinine-stable  History of lumbar disc disease and status post removal of hardware and back  History of depression  Plan: Follow-up in March after trial of Megace.  Consider cardiology referral regarding aortic stenosis.  We will get SPEP at next visit since she has chronic pain.  Subjective:   Patient presents for Medicare Annual/Subsequent preventive examination.  Review Past Medical/Family/Social: See above   Risk Factors  Current exercise habits: Unable to exercise due to back pain Dietary issues discussed: Megace will be started to improve appetite and weight gain  Cardiac risk factors: History of smoking  Depression Screen  (Note: if answer to either of the following is "Yes", a more complete depression screening is indicated)   Over the past two weeks, have you felt down, depressed or hopeless? No   Over the past two weeks, have you felt little interest or pleasure in doing things? No Have you lost interest or pleasure in daily life? No Do you often feel hopeless? No Do you cry easily over simple problems? No   Activities of Daily Living  In your present state of health, do you have any difficulty performing the following activities?:   Driving? No  Managing money? No  Feeding yourself? No  Getting from bed to chair? No  Climbing a flight of stairs? No  Preparing food and eating?: No  Bathing or showering? No  Getting dressed: No  Getting to the toilet? No  Using the toilet:No  Moving around from place to place: No  In the past year have you fallen or had a near fall?:No  Are you sexually active? No  Do you have more than one partner? No   Hearing Difficulties: No  Do you often ask people to speak up or repeat themselves? No  Do you experience ringing or noises in your ears? No  Do you have difficulty understanding soft or whispered voices? No  Do you feel that you have a problem with memory?  Yes Do you often misplace items?  Yes   Home Safety:  Do you have a smoke alarm at your residence? Yes Do you have grab bars in the bathroom?  Yes  do you have throw rugs in your house?  Yes   Cognitive Testing  Alert? Yes Normal Appearance?Yes  Oriented to person? Yes Place? Yes  Time? Yes  Recall of three objects? Yes  Can perform simple calculations? Yes  Displays appropriate judgment?Yes  Can read the correct time from a watch face?Yes   List the Names of Other Physician/Practitioners you currently use:  See referral list for the physicians patient is currently seeing.  [facility]   Review of Systems: See above   Objective:     General appearance: Appears younger than stated age but frail Head: Normocephalic, without obvious abnormality, atraumatic  Eyes: conj clear, EOMi PEERLA  Ears: normal TM's and external ear canals both ears  Nose:  Nares normal. Septum midline. Mucosa normal. No drainage or sinus tenderness.  Throat: lips, mucosa, and tongue normal; teeth and gums normal  Neck: no adenopathy, no carotid bruit, no JVD, supple, symmetrical, trachea midline and thyroid not enlarged, symmetric, no tenderness/mass/nodules  No CVA tenderness.  Lungs: clear to auscultation bilaterally  Breasts: normal appearance, no masses or tenderness Heart: regular rate and rhythm, S1, S2 normal 2/6 systolic ejection murmur transmitted into carotids Abdomen: soft, non-tender; bowel sounds normal; no masses, no organomegaly  Musculoskeletal: ROM normal in all joints, no crepitus, no deformity, Normal muscle strengthen. Back  is symmetric, no curvature. Skin: Skin color, texture, turgor normal. No rashes or lesions  Lymph nodes: Cervical, supraclavicular, and axillary nodes normal.  Neurologic: CN 2 -12 Normal, Normal symmetric reflexes. Normal coordination and gait  Psych: Alert & Oriented x 3, Mood appear stable.    Assessment:    Annual wellness medicare exam   Plan:    During the course of the visit the patient was educated and counseled about appropriate screening and preventive services including:   Annual flu vaccine     Patient Instructions (the written plan) was given to the patient.  Medicare Attestation  I have personally reviewed:  The patient's medical and social history  Their use of alcohol, tobacco or illicit drugs  Their current medications and supplements  The patient's functional ability including ADLs,fall risks, home safety risks, cognitive, and hearing and visual impairment  Diet and physical activities  Evidence for depression or mood disorders  The patient's weight, height, BMI, and visual acuity have been recorded in the chart. I have made referrals, counseling, and provided education to the patient based on review of the above and I have provided the patient with a written personalized care plan for  preventive services.

## 2018-08-14 NOTE — Patient Instructions (Addendum)
Trial of Megace and follow-up in March.  Get SPEP prior to that visit.  May want to consider cardiology evaluation for moderate aortic stenosis

## 2018-08-16 ENCOUNTER — Other Ambulatory Visit: Payer: Self-pay

## 2018-08-16 MED ORDER — VILAZODONE HCL 40 MG PO TABS: ORAL_TABLET | ORAL | 5 refills | Status: DC

## 2018-08-16 MED ORDER — ALBUTEROL SULFATE HFA 108 (90 BASE) MCG/ACT IN AERS: INHALATION_SPRAY | RESPIRATORY_TRACT | 3 refills | Status: DC

## 2018-08-16 NOTE — Telephone Encounter (Signed)
Patient called to request refills on VIIBRYD and albuterol. She changed pharmacies.

## 2018-08-27 ENCOUNTER — Other Ambulatory Visit: Payer: Self-pay | Admitting: Internal Medicine

## 2018-09-03 ENCOUNTER — Other Ambulatory Visit: Payer: Self-pay

## 2018-09-03 MED ORDER — TIOTROPIUM BROMIDE-OLODATEROL 2.5-2.5 MCG/ACT IN AERS: 2.0000 | INHALATION_SPRAY | Freq: Every day | RESPIRATORY_TRACT | 11 refills | Status: DC

## 2018-09-03 MED ORDER — TIOTROPIUM BROMIDE-OLODATEROL 2.5-2.5 MCG/ACT IN AERS: 2.0000 | INHALATION_SPRAY | Freq: Every day | RESPIRATORY_TRACT | 99 refills | Status: DC

## 2018-09-03 NOTE — Telephone Encounter (Signed)
Patient's husband is calling to request a refill.  [facility] [address].

## 2018-09-09 ENCOUNTER — Other Ambulatory Visit: Payer: Medicare Other | Admitting: Internal Medicine

## 2018-09-14 ENCOUNTER — Ambulatory Visit: Payer: Medicare Other | Admitting: Internal Medicine

## 2018-09-16 ENCOUNTER — Other Ambulatory Visit: Payer: Self-pay | Admitting: Emergency Medicine

## 2018-09-23 ENCOUNTER — Telehealth: Payer: Self-pay | Admitting: Internal Medicine

## 2018-09-23 NOTE — Telephone Encounter (Signed)
Spoke with [name] to have [place] call us back to schedule virtual visit for 6 week recheck with weight check.

## 2018-09-29 NOTE — Telephone Encounter (Signed)
Appointment scheduled.

## 2018-10-01 ENCOUNTER — Encounter: Payer: Self-pay | Admitting: Internal Medicine

## 2018-10-01 ENCOUNTER — Ambulatory Visit (INDEPENDENT_AMBULATORY_CARE_PROVIDER_SITE_OTHER): Payer: Medicare Other | Admitting: Internal Medicine

## 2018-10-01 VITALS — Wt 105.0 lb

## 2018-10-01 DIAGNOSIS — G8929 Other chronic pain: Secondary | ICD-10-CM | POA: Diagnosis not present

## 2018-10-01 DIAGNOSIS — M545 Low back pain, unspecified: Secondary | ICD-10-CM

## 2018-10-01 DIAGNOSIS — J449 Chronic obstructive pulmonary disease, unspecified: Secondary | ICD-10-CM

## 2018-10-01 DIAGNOSIS — R634 Abnormal weight loss: Secondary | ICD-10-CM | POA: Diagnosis not present

## 2018-10-01 MED ORDER — METHYLPREDNISOLONE 4 MG PO TBPK: ORAL_TABLET | ORAL | 0 refills | Status: DC

## 2018-10-13 NOTE — Patient Instructions (Signed)
I am pleased he had gained weight.  Please follow-up here in 3 months and continue to weigh yourself frequently.  Continue Megace.

## 2018-10-13 NOTE — Progress Notes (Signed)
   Subjective:    Patient ID: Katherine [name], female    DOB: [birth year], 85 y.o.   MRN: [id number]  HPI 85 year old Female seen today by interactive audio and video telecommunications due to the Coronavirus pandemic.  Patient consents to visit in this format today.  She is identified by 2 identifiers as Katherine [name]. [place], a longstanding patient in this practice.  We have been concerned about patient's weight over the past few weeks.  She has chronic back pain.  In mid February she weighed 102 pounds.  In November she weighed 104 pounds.  She has a history of COPD and recently saw Dr. Lamonte Sakai for treatment of COPD.  Placed her on Anoro and asked her to stop Symbicort.  She is to keep albuterol on hand for shortness of breath.  She had  pulmonary function testing showing moderately severe obstruction.  Anoro decreased repeated use of her albuterol inhaler significantly.  Home nebulizer machine was ordered.  She will use duo nebs in the nebulizer.  She was started on Megace in February.  She had total protein of 5.9 and albumin of 3.5 at that time.  TSH was normal.    Review of Systems See above    Objective:   Physical Exam  Patient says that she weighed 105 pounds this morning on her scales without clothes.  I suspect she has gained a little bit of weight which is encouraging.  I think weight loss was a combination of struggling with COPD symptoms and back pain.      Assessment & Plan:  Weight loss apparently improved with Megace.  Will continue Megace and follow-up in 3 months.  COPD-significantly improved with Anoro.  I would like to follow-up again with her in 3 months here in the office.  She will continue to weigh herself regularly and let me know if she begins to lose weight again.

## 2018-10-20 ENCOUNTER — Ambulatory Visit: Payer: Medicare Other | Admitting: Emergency Medicine

## 2018-12-27 ENCOUNTER — Other Ambulatory Visit: Payer: Self-pay

## 2018-12-27 ENCOUNTER — Ambulatory Visit: Payer: Medicare Other | Admitting: Emergency Medicine

## 2018-12-27 ENCOUNTER — Encounter: Payer: Self-pay | Admitting: Emergency Medicine

## 2018-12-27 DIAGNOSIS — J449 Chronic obstructive pulmonary disease, unspecified: Secondary | ICD-10-CM | POA: Diagnosis not present

## 2018-12-27 MED ORDER — ANORO ELLIPTA 62.5-25 MCG/INH IN AEPB: INHALATION_SPRAY | RESPIRATORY_TRACT | 4 refills | Status: DC

## 2018-12-27 MED ORDER — ALBUTEROL SULFATE HFA 108 (90 BASE) MCG/ACT IN AERS: INHALATION_SPRAY | RESPIRATORY_TRACT | 11 refills | Status: DC

## 2018-12-27 NOTE — Patient Instructions (Addendum)
Please continue Anoro 1 inhalation once daily. Keep your albuterol available to use 2 puffs if needed for shortness of breath, chest tightness, wheezing. Get the flu shot this fall. Follow with Dr Lamonte Sakai in 6 months or sooner if you have any problems

## 2018-12-27 NOTE — Progress Notes (Signed)
Subjective:    Patient ID: Katherine [name], female    DOB: 11-Mar-1929, 85 y.o.   MRN: [id number]  HPI 85 year old former smoker (55 - 100 pack years) with a history of irritable bowel, GERD with PUD and hiatal hernia, arthritis with chronic low back pain, positive PPD (not treated), recovered alcoholic.  She carries a history of COPD that was made over 20 yrs ago. She has been on BD's for about 5 yrs.  She is currently on Symbicort (added 3-4 mo ago), has albuterol that she now uses approximately 3-4x a day. She has a nebulizer, ? Which med is going in it.  She is also on prednisone 10 mg daily for COPD and also for malnutrition which is felt to be associated to her obstructive lung disease. She has experienced progressive exertional SOB, now when she walks across the room. No real cough, no wheeze. She has had some increased hoarseness over the last month. She occassionally feels some chest pressure.  She has chronic rhinitis, no GERD sx.   ROV 06/22/18 --follow-up visit for presumed COPD, also has a history of positive PPD (no treatment).  She has been on prednisone 10 mg daily.  We started a trial of Anoro at her last visit to see if she would benefit.  She notes significant benefit, much less albuterol use. She is having some hoarseness. Has DuoNeb available, uses a few times a week. Pulmonary function testing was done today which I have reviewed, as below. Did not desaturate last time w ambulation. Flu shot up to date.   ROV 12/27/2018 --85 year old woman who follows up today for moderate severe obstruction, COPD.  Also history of a positive PPD.  At her last visit I tried changing Anoro to [employer] to see if this would help with some hoarseness and upper airway irritability.  She reports that in the end she decided to to stay with Anoro. Rarely has throat issues. Minimal cough. Her activity is limited by back pain.  She is using albuterol less frequently, probably averages about once a day. She  is off prednisone.    06/22/18:  FEV1 0.94 (66) FVC 1.72 (89) Ratio 54% RV 131% TLC 106% DLCO/VA 40 % pred   Review of Systems  Constitutional: Positive for activity change, fatigue and unexpected weight change.  HENT: Positive for postnasal drip and voice change.   Eyes: Negative.   Respiratory: Positive for shortness of breath.   Cardiovascular: Negative.   Gastrointestinal: Negative.   Endocrine: Negative.   Genitourinary: Negative.   Musculoskeletal: Positive for back pain and joint swelling.  Skin: Negative.   Allergic/Immunologic: Negative.   Neurological: Positive for weakness.  Hematological: Negative.   Psychiatric/Behavioral: Negative.     Past Medical History:  Diagnosis Date  . Alcoholic ([place])    36 years sober  . Allergy   . Anemia   . Arthritis   . Asthma    patient denies  . B12 deficiency   . COPD (chronic obstructive pulmonary disease) ([place])   . Depression    not recent  . Diverticulosis   . Esophageal stricture   . Headache    cluster headaches occasionally  . Hepatic cyst 01/30/10  . Hiatal hernia 1985   patient unaware  . Hx of adenomatous colonic polyps 2006  . Hyperlipidemia   . IBS (irritable bowel syndrome)   . PONV (postoperative nausea and vomiting)    no nausea or vomitting after surgery 07/2014  . Positive PPD   .  PUD (peptic ulcer disease)   . Shortness of breath dyspnea    SOB WITH EXERTION   (NOT NEW)     Family History  Problem Relation Age of Onset  . Ulcers Father   . Stroke Father   . Irritable bowel syndrome Father   . Ovarian cancer Sister   . Colon cancer Neg Hx      Social History   Socioeconomic History  . Marital status: Married    Spouse name: Not on file  . Number of children: 2  . Years of education: Not on file  . Highest education level: Not on file  Occupational History    Employer: RETIRED  Social Needs  . Financial resource strain: Not on file  . Food insecurity    Worry: Not on file     Inability: Not on file  . Transportation needs    Medical: Not on file    Non-medical: Not on file  Tobacco Use  . Smoking status: Former Smoker    Years: 60.00    Types: Cigarettes    Quit date: 02/02/2010    Years since quitting: 8.9  . Smokeless tobacco: Never Used  Substance and Sexual Activity  . Alcohol use: No    Comment: recovering alcoholic     35 YRS    . Drug use: No  . Sexual activity: Not on file  Lifestyle  . Physical activity    Days per week: Not on file    Minutes per session: Not on file  . Stress: Not on file  Relationships  . Social Herbalist on phone: Not on file    Gets together: Not on file    Attends religious service: Not on file    Active member of club or organization: Not on file    Attends meetings of clubs or organizations: Not on file    Relationship status: Not on file  . Intimate partner violence    Fear of current or ex partner: Not on file    Emotionally abused: Not on file    Physically abused: Not on file    Forced sexual activity: Not on file  Other Topics Concern  . Not on file  Social History Narrative   Daily caffeine    she has worked at [employer] lived in [place], [place], [place]    Allergies  Allergen Reactions  . Aspirin Other (See Comments)    Hx of ulcers  . Ibuprofen Nausea Only     Outpatient Medications Prior to Visit  Medication Sig Dispense Refill  . ALPRAZolam (XANAX) 0.25 MG tablet Take 1 tablet (0.25 mg total) by mouth 2 (two) times daily as needed for anxiety. 30 tablet 1  . atorvastatin (LIPITOR) 10 MG tablet TAKE 1 TABLET (10 MG TOTAL) BY MOUTH DAILY. 90 tablet 3  . B Complex Vitamins (B-COMPLEX/B-12 TR PO) Take 1 tablet by mouth daily.    . cholecalciferol (VITAMIN D) 1000 UNITS tablet Take 1,000 Units by mouth daily.    Marland Kitchen PATADAY 0.2 % SOLN Place 1 drop into both eyes daily as needed (for dry, allergy eyes).   4  . promethazine (PHENERGAN) 12.5 MG tablet Take 1 tablet  (12.5 mg total) by mouth every 8 (eight) hours as needed for nausea or vomiting. 30 tablet 3  . traMADol (ULTRAM) 50 MG tablet Take 2 tablets by mouth as needed.    . traZODone (DESYREL) 50 MG tablet TAKE 3  TABLETS BY MOUTH AT BEDTIME. 270 tablet 1  . Vilazodone HCl (VIIBRYD) 40 MG TABS TAKE 1 TABLET (40 MG TOTAL) BY MOUTH DAILY. 30 tablet 5  . VITAMIN E PO Take 1 tablet by mouth daily.    Marland Kitchen albuterol (PROVENTIL HFA;VENTOLIN HFA) 108 (90 Base) MCG/ACT inhaler INHALE 2 PUFFS INTO THE LUNGS 4 TIMES A DAY AS NEEDED 8.5 Inhaler 3  . ANORO ELLIPTA 62.5-25 MCG/INH AEPB TAKE 1 PUFF BY MOUTH EVERY DAY 60 each 2  . methylPREDNISolone (MEDROL DOSEPAK) 4 MG TBPK tablet TAKE 6 TABLETS ON DAY 1 AS DIRECTED ON PACKAGE AND DECREASE BY 1 TAB EACH DAY FOR A TOTAL OF 6 DAYS.        6,5,4,3,2,1 21 tablet 0  . Tiotropium Bromide-Olodaterol (STIOLTO RESPIMAT) 2.5-2.5 MCG/ACT AERS Inhale 2 puffs into the lungs daily. 1 Inhaler 11   No facility-administered medications prior to visit.         Objective:   Physical Exam Vitals:   12/27/18 1555  BP: 110/68  Pulse: (!) 107  SpO2: 96%  Weight: 105 lb (47.6 kg)  Height: 5\' 1"  (1.549 m)   Gen: Pleasant, thin elderly woman, in no distress,  normal affect  ENT: No lesions,  mouth clear,  oropharynx clear, no postnasal drip  Neck: No JVD, no stridor  Lungs: No use of accessory muscles, distant, no crackles or wheezing on normal respiration, no wheeze on forced expiration  Cardiovascular: RRR, holosystolic murmur  Musculoskeletal: No deformities, no cyanosis or clubbing  Neuro: alert, awake, non focal  Skin: Warm, no lesions or rash      Assessment & Plan:  COPD (chronic obstructive pulmonary disease) Please continue Anoro 1 inhalation once daily. Keep your albuterol available to use 2 puffs if needed for shortness of breath, chest tightness, wheezing. Get the flu shot this fall. Follow with Dr Lamonte Sakai in 6 months or sooner if you have any problems   Baltazar Apo, MD, PhD 12/27/2018, 4:12 PM [facility] [phone] or if no answer [phone]

## 2018-12-27 NOTE — Assessment & Plan Note (Signed)
Please continue Anoro 1 inhalation once daily. Keep your albuterol available to use 2 puffs if needed for shortness of breath, chest tightness, wheezing. Get the flu shot this fall. Follow with Dr Lamonte Sakai in 6 months or sooner if you have any problems

## 2019-01-07 ENCOUNTER — Ambulatory Visit: Payer: Medicare Other | Admitting: Emergency Medicine

## 2019-02-07 ENCOUNTER — Other Ambulatory Visit: Payer: Self-pay

## 2019-02-07 ENCOUNTER — Emergency Department (HOSPITAL_COMMUNITY)
Admission: EM | Admit: 2019-02-07 | Discharge: 2019-02-07 | Disposition: A | Discharge status: Home / Self Care | Payer: Medicare Other | Attending: Emergency Medicine | Admitting: Emergency Medicine

## 2019-02-07 ENCOUNTER — Encounter (HOSPITAL_COMMUNITY): Payer: Self-pay | Admitting: Emergency Medicine

## 2019-02-07 ENCOUNTER — Emergency Department (HOSPITAL_COMMUNITY): Payer: Medicare Other

## 2019-02-07 ENCOUNTER — Telehealth: Payer: Self-pay | Admitting: Internal Medicine

## 2019-02-07 DIAGNOSIS — Z79899 Other long term (current) drug therapy: Secondary | ICD-10-CM | POA: Diagnosis not present

## 2019-02-07 DIAGNOSIS — Z20828 Contact with and (suspected) exposure to other viral communicable diseases: Secondary | ICD-10-CM | POA: Diagnosis not present

## 2019-02-07 DIAGNOSIS — J441 Chronic obstructive pulmonary disease with (acute) exacerbation: Secondary | ICD-10-CM | POA: Diagnosis not present

## 2019-02-07 DIAGNOSIS — Z87891 Personal history of nicotine dependence: Secondary | ICD-10-CM | POA: Insufficient documentation

## 2019-02-07 DIAGNOSIS — R05 Cough: Secondary | ICD-10-CM | POA: Diagnosis present

## 2019-02-07 LAB — CBC WITH DIFFERENTIAL/PLATELET
Abs Immature Granulocytes: 0.02 10*3/uL (ref 0.00–0.07)
Basophils Absolute: 0.1 10*3/uL (ref 0.0–0.1)
Basophils Relative: 1 %
Eosinophils Absolute: 0.4 10*3/uL (ref 0.0–0.5)
Eosinophils Relative: 5 %
HCT: 37.3 % (ref 36.0–46.0)
Hemoglobin: 11.5 g/dL — ABNORMAL LOW (ref 12.0–15.0)
Immature Granulocytes: 0 %
Lymphocytes Relative: 20 %
Lymphs Abs: 1.5 10*3/uL (ref 0.7–4.0)
MCH: 28.3 pg (ref 26.0–34.0)
MCHC: 30.8 g/dL (ref 30.0–36.0)
MCV: 91.6 fL (ref 80.0–100.0)
Monocytes Absolute: 0.5 10*3/uL (ref 0.1–1.0)
Monocytes Relative: 7 %
Neutro Abs: 4.9 10*3/uL (ref 1.7–7.7)
Neutrophils Relative %: 67 %
Platelets: 240 10*3/uL (ref 150–400)
RBC: 4.07 MIL/uL (ref 3.87–5.11)
RDW: 13.6 % (ref 11.5–15.5)
WBC: 7.4 10*3/uL (ref 4.0–10.5)
nRBC: 0 % (ref 0.0–0.2)

## 2019-02-07 LAB — BASIC METABOLIC PANEL WITH GFR
Anion gap: 9 (ref 5–15)
BUN: 24 mg/dL — ABNORMAL HIGH (ref 8–23)
CO2: 22 mmol/L (ref 22–32)
Calcium: 8.9 mg/dL (ref 8.9–10.3)
Chloride: 111 mmol/L (ref 98–111)
Creatinine, Ser: 1 mg/dL (ref 0.44–1.00)
GFR calc Af Amer: 58 mL/min — ABNORMAL LOW
GFR calc non Af Amer: 50 mL/min — ABNORMAL LOW
Glucose, Bld: 109 mg/dL — ABNORMAL HIGH (ref 70–99)
Potassium: 3.4 mmol/L — ABNORMAL LOW (ref 3.5–5.1)
Sodium: 142 mmol/L (ref 135–145)

## 2019-02-07 LAB — SARS CORONAVIRUS 2 (TAT 6-24 HRS): SARS Coronavirus 2: NEGATIVE

## 2019-02-07 LAB — D-DIMER, QUANTITATIVE: D-Dimer, Quant: 0.66 ug/mL-FEU — ABNORMAL HIGH (ref 0.00–0.50)

## 2019-02-07 MED ORDER — PREDNISONE 20 MG PO TABS: 40.0000 mg | ORAL_TABLET | Freq: Every day | ORAL | 0 refills | Status: DC

## 2019-02-07 MED ORDER — DEXAMETHASONE SODIUM PHOSPHATE 10 MG/ML IJ SOLN
10.0000 mg | Freq: Once | INTRAMUSCULAR | Status: AC
  Administered 2019-02-07: 19:00:00 10 mg via INTRAVENOUS
  Filled 2019-02-07: qty 1

## 2019-02-07 MED ORDER — SODIUM CHLORIDE (PF) 0.9 % IJ SOLN
INTRAMUSCULAR | Status: AC
  Filled 2019-02-07: qty 50

## 2019-02-07 MED ORDER — IOHEXOL 350 MG/ML SOLN
100.0000 mL | Freq: Once | INTRAVENOUS | Status: AC | PRN
  Administered 2019-02-07: 100 mL via INTRAVENOUS

## 2019-02-07 NOTE — ED Provider Notes (Signed)
[facility] DEPT Provider Note   CSN: [id number] Arrival date & time: 02/07/19  1200     History   Chief Complaint Chief Complaint  Patient presents with  . Shortness of Breath    HPI Katherine [name] is a 85 y.o. female with past medical history significant for COPD presents emergency department today with chief complaint of cough and shortness of breath x1 week.  Patient reports associated nasal congestion green mucus.  She states her cough is productive however she has not noticed what color her sputum is.  She has associated chest pain after coughing episodes. She states her shortness of breath has progressively worsened.  When she walks from one room to the other in the house she has to stop to catch her breath, this is unusual for her.  She has been using her albuterol inhaler without relief.  She did state using her nebulizer seems to help but it is short-term symptom improvement.  She did call the primary care's office today was sent to the ED for further evaluation.  Patient denies fever, chills, sinus pressure, ear pain, abdominal pain, nausea, vomiting, urinary symptoms, diarrhea. She denies any sick contact or known exposure to anyone positive for COVID-19.  Pt does not wear home O2. She is not anticoagulated. History provided by patient with additional history obtained from chart review.     Past Medical History:  Diagnosis Date  . Alcoholic ([place])    36 years sober  . Allergy   . Anemia   . Arthritis   . Asthma    patient denies  . B12 deficiency   . COPD (chronic obstructive pulmonary disease) ([place])   . Depression    not recent  . Diverticulosis   . Esophageal stricture   . Headache    cluster headaches occasionally  . Hepatic cyst 01/30/10  . Hiatal hernia 1985   patient unaware  . Hx of adenomatous colonic polyps 2006  . Hyperlipidemia   . IBS (irritable bowel syndrome)   . PONV (postoperative nausea and vomiting)    no  nausea or vomitting after surgery 07/2014  . Positive PPD   . PUD (peptic ulcer disease)   . Shortness of breath dyspnea    SOB WITH EXERTION   (NOT NEW)    Patient Active Problem List   Diagnosis Date Noted  . Aortic stenosis 07/16/2017  . Painful orthopaedic hardware ([place]) 12/13/2015  . Closed Colles' fracture of left radius 06/14/2015  . Spinal stenosis of lumbar region 03/21/2015  . Spondylolisthesis at L4-L5 level 08/02/2014  . History of non anemic vitamin B12 deficiency 01/02/2013  . History 01/02/2013  . History of pyloroplasty 01/02/2013  . History of Helicobacter pylori infection 01/02/2013  . History of depression 01/02/2013  . Recovering alcoholic in remission ([place]) 01/02/2013  . History of positive PPD 01/02/2013  . Anemia, iron deficiency 11/15/2012  . COPD (chronic obstructive pulmonary disease) ([place]) 07/06/2012  . Hyperlipidemia 12/24/2010  . GE reflux 12/24/2010  . Chronic back pain 12/24/2010    Past Surgical History:  Procedure Laterality Date  . ABDOMINAL HYSTERECTOMY    . APPENDECTOMY    . BARTHOLIN GLAND CYST EXCISION     x2  . BREAST BIOPSY Bilateral    Milk glnd  . CATARACT EXTRACTION W/ INTRAOCULAR LENS  IMPLANT, BILATERAL Bilateral   . COLONOSCOPY    . COLONOSCOPY W/ POLYPECTOMY    . HARDWARE REMOVAL N/A 12/13/2015   Procedure: Removal of HARDWARE  Lumbar Five-Sacral One ;  Surgeon: Kary Kos, MD;  Location: [facility];  Service: Neurosurgery;  Laterality: N/A;  . UPPER GI ENDOSCOPY    . VAGOTOMY AND PYLOROPLASTY     in [place], [place]     OB History   No obstetric history on file.      Home Medications    Prior to Admission medications   Medication Sig Start Date End Date Taking? Authorizing Provider  albuterol (VENTOLIN HFA) 108 (90 Base) MCG/ACT inhaler INHALE 2 PUFFS INTO THE LUNGS 4 TIMES A DAY AS NEEDED 12/27/18   Byrum, Rose Fillers, MD  ALPRAZolam Duanne Moron) 0.25 MG tablet Take 1 tablet (0.25 mg total) by mouth 2 (two) times daily as  needed for anxiety. 08/03/18   Elby Showers, MD  atorvastatin (LIPITOR) 10 MG tablet TAKE 1 TABLET (10 MG TOTAL) BY MOUTH DAILY. 04/20/18   Elby Showers, MD  B Complex Vitamins (B-COMPLEX/B-12 TR PO) Take 1 tablet by mouth daily.    [provider]  cholecalciferol (VITAMIN D) 1000 UNITS tablet Take 1,000 Units by mouth daily.    [provider]  PATADAY 0.2 % SOLN Place 1 drop into both eyes daily as needed (for dry, allergy eyes).  10/10/15   [provider]  promethazine (PHENERGAN) 12.5 MG tablet Take 1 tablet (12.5 mg total) by mouth every 8 (eight) hours as needed for nausea or vomiting. 08/03/18   Elby Showers, MD  traMADol (ULTRAM) 50 MG tablet Take 2 tablets by mouth as needed. 07/23/18   [provider]  traZODone (DESYREL) 50 MG tablet TAKE 3 TABLETS BY MOUTH AT BEDTIME. 06/17/18   Elby Showers, MD  umeclidinium-vilanterol [facility] ELLIPTA) 62.5-25 MCG/INH AEPB TAKE 1 PUFF BY MOUTH EVERY DAY 12/27/18   Collene Gobble, MD  Vilazodone HCl (VIIBRYD) 40 MG TABS TAKE 1 TABLET (40 MG TOTAL) BY MOUTH DAILY. 08/16/18   Elby Showers, MD  VITAMIN E PO Take 1 tablet by mouth daily.    [provider]    Family History Family History  Problem Relation Age of Onset  . Ulcers Father   . Stroke Father   . Irritable bowel syndrome Father   . Ovarian cancer Sister   . Colon cancer Neg Hx     Social History Social History   Tobacco Use  . Smoking status: Former Smoker    Years: 60.00    Types: Cigarettes    Quit date: 02/02/2010    Years since quitting: 9.0  . Smokeless tobacco: Never Used  Substance Use Topics  . Alcohol use: No    Comment: recovering alcoholic     35 YRS    . Drug use: No     Allergies   Aspirin and Ibuprofen   Review of Systems Review of Systems  Constitutional: Negative for chills and fever.  HENT: Negative for congestion, ear discharge, ear pain, sinus pressure, sinus pain and sore throat.   Eyes: Negative for  pain and redness.  Respiratory: Positive for cough and shortness of breath.   Cardiovascular: Negative for chest pain.  Gastrointestinal: Negative for abdominal pain, constipation, diarrhea, nausea and vomiting.  Genitourinary: Negative for dysuria and hematuria.  Musculoskeletal: Negative for back pain and neck pain.  Skin: Negative for wound.  Neurological: Negative for weakness, numbness and headaches.     Physical Exam Updated Vital Signs BP 136/68 (BP Location: Left Arm)   Pulse (!) 111   Temp 97.7 F (36.5 C) (  Oral)   Resp (!) 22   SpO2 94%   Physical Exam Vitals signs and nursing note reviewed.  Constitutional:      General: She is not in acute distress.    Appearance: She is not ill-appearing.  HENT:     Head: Normocephalic and atraumatic.     Right Ear: Tympanic membrane and external ear normal.     Left Ear: Tympanic membrane and external ear normal.     Nose: Nose normal.     Mouth/Throat:     Mouth: Mucous membranes are moist.     Pharynx: Oropharynx is clear.  Eyes:     General: No scleral icterus.       Right eye: No discharge.        Left eye: No discharge.     Extraocular Movements: Extraocular movements intact.     Conjunctiva/sclera: Conjunctivae normal.     Pupils: Pupils are equal, round, and reactive to light.  Neck:     Musculoskeletal: Normal range of motion.     Vascular: No JVD.  Cardiovascular:     Rate and Rhythm: Normal rate and regular rhythm.     Pulses: Normal pulses.          Radial pulses are 2+ on the right side and 2+ on the left side.     Heart sounds: Murmur present.  Pulmonary:     Comments: Lungs clear to auscultation in all fields. Symmetric chest rise. No wheezing, rales, or rhonchi. She is speaking in full sentences. No accessory muscle use. Abdominal:     Comments: Abdomen is soft, non-distended, and non-tender in all quadrants. No rigidity, no guarding. No peritoneal signs.  Musculoskeletal: Normal range of motion.      Right lower leg: No edema.     Left lower leg: No edema.     Comments: Homans sign absent bilaterally, no lower extremity edema, no palpable cords, compartments are soft  Skin:    General: Skin is warm and dry.     Capillary Refill: Capillary refill takes less than 2 seconds.  Neurological:     Mental Status: She is oriented to person, place, and time.     GCS: GCS eye subscore is 4. GCS verbal subscore is 5. GCS motor subscore is 6.     Comments: Fluent speech, no facial droop.  Psychiatric:        Behavior: Behavior normal.      ED Treatments / Results  Labs (all labs ordered are listed, but only abnormal results are displayed) Labs Reviewed  SARS CORONAVIRUS 2  CBC WITH DIFFERENTIAL/PLATELET  BASIC METABOLIC PANEL  D-DIMER, QUANTITATIVE (NOT AT [facility])    EKG None  Radiology Dg Chest 2 View  Result Date: 02/07/2019 CLINICAL DATA:  85 year old presenting with a 1 week history of cough and shortness of breath unrelieved with her inhaled medications or nebulizer treatments at home. Current history of COPD. Former smoker. EXAM: CHEST - 2 VIEW COMPARISON:  03/19/2018 and earlier. FINDINGS: Cardiac silhouette normal in size, unchanged. Thoracic aorta atherosclerotic, unchanged. Hilar and mediastinal contours otherwise unremarkable. Severe emphysematous changes throughout both lungs, particularly the UPPER lobes. Mild central peribronchial thickening, unchanged. Lungs otherwise clear. No localized airspace consolidation. No pleural effusions. No pneumothorax. Normal pulmonary vascularity. Degenerative changes throughout the thoracic spine, remote compression fracture of the UPPER endplate of what I believe is T8, and prior upper lumbar fusion. IMPRESSION: COPD/emphysema. No acute cardiopulmonary disease. Emphysema (ICD10-J43.9). Aortic Atherosclerosis (ICD10-I70.0). Electronically Signed  By: Evangeline Dakin M.D.   On: 02/07/2019 12:56    Procedures Procedures (including critical care  time)  Medications Ordered in ED Medications - No data to display   Initial Impression / Assessment and Plan / ED Course  I have reviewed the triage vital signs and the nursing notes.  Pertinent labs & imaging results that were available during my care of the patient were reviewed by me and considered in my medical decision making (see chart for details).  85 yo female with history of COPD presents with shortness of breath and cough x 1 week. In triage she was noted to be tachycardic to 111, during my exam her hear rate was consistently in the 80s.  Lungs are clear to auscultation in all fields, no hypoxia, not tachypneic. She is speaking in full sentences. Murmur hear on exam, possible aortic stenosis contributing to shortness of breath. Pt reports she is aware of murmur, does not remember how long it has been there.Discussed she will need pcp follow up for possible echo if discharged home. No obvious risk factors for PE, will check d dimer.  EKG shows sinus tachycardia, no ischemic changes. Labs are significant for elevated d dimer at 0.66. No leukocytosis, hemoglobin appears close to baseline. No severe electrolyte derangements. Covid test pending. Chest xray viewed by me without infiltrate, appears similar compared to prior chest xrays. Given elevated dimer will CTA chest for possible PE. Pt has been seen by ED attending Dr. Wilson Singer who agrees with treatment plan.  Patient care transferred to Dr. Vanita Panda at the end of my shift. Patient presentation, ED course, and plan of care discussed with review of all pertinent labs and imaging. Please see his note for further details regarding further ED course and disposition. Anticipate discharge home if CTA is negative for PE.  This note was prepared using Dragon voice recognition software and may include unintentional dictation errors due to the inherent limitations of voice recognition software.     Final Clinical Impressions(s) / ED Diagnoses    Final diagnoses:  None    ED Discharge Orders    None       Flint Melter 02/07/19 1542    Virgel Manifold, MD 02/11/19 1144

## 2019-02-07 NOTE — ED Notes (Addendum)
Patient and husband updated on delay awaiting CT scan. This RN personally asked CT tech where patient is on the list- was told patient had one scan in front of her. Patient and husband updated on this information. Patient husband becoming frustrated and pacing, stating he wants to "just go home." This RN explained that if the patient left, it would be considered AMA. This RN offered to let patient's husband go home to take care of their pets, then return. Patient's husband refused and states he "doesn't want to leave her here." This RN reiterates that the patient will hopefully have her CT scan soon, and apologizes for the delay. Patient and husband verbalize understanding. VSS. Pt denies other needs at this time.

## 2019-02-07 NOTE — ED Notes (Signed)
Discharge instructions reviewed with patient. Patient verbalizes understanding. VSS.   

## 2019-02-07 NOTE — Discharge Instructions (Addendum)
You have been seen today for shortness of breath. Please read and follow all provided instructions. Return to the emergency room for worsening condition or new concerning symptoms.    You were tested for covid-19 today, the result is negative.   A heart murmur was heard on your exam today. Please follow up with your primary care doctor for further evaulation.  1. Medications:  Continue usual home medications and steroids for four additional days  2. Treatment: rest, drink plenty of fluids  3. Follow Up: Please follow up with your primary doctor in 2-5 days for discussion of your diagnoses and further evaluation after today's visit; Call today to arrange your follow up.    ?

## 2019-02-07 NOTE — ED Notes (Signed)
MD Vanita Panda at bedside, updating patient on results.

## 2019-02-07 NOTE — ED Notes (Signed)
This RN updated patient and husband on the newly ordered CT scan. This RN endorses the MD will be by to update her on her blood results after she gets her CT scan, so he can give an appropriate diagnosis. Patient and husband verbalize understanding. Warm blanket given, per request. VSS. Patient and husband deny further needs.

## 2019-02-07 NOTE — Telephone Encounter (Signed)
Katherine [name] [phone]  Katherine [name] called to say she is having cold symptoms, COPD breathing is labored at times, bouts of nausea, extreme fatigue, depleted of all energy, deep cough, runny nose, chest congestion, no fever, no COVID exposure, the only time she goes out is to grocery store and drug store.

## 2019-02-07 NOTE — ED Notes (Addendum)
Patient updated on delay and provided warm blanket. This RN reiterated that the patient is awaiting her CT scan, and further explained that the radiology department has been very busy and will get to her scan as soon as possible. Patient and husband verbalize understanding. Patient denies other needs at this time. VSS. Patient husband provided with a cup of coffee, per request.

## 2019-02-07 NOTE — Telephone Encounter (Signed)
I suggest going to Katherine [name] ED for evaluation. They can test her for Covid, get CXR and labs all at once

## 2019-02-07 NOTE — ED Notes (Signed)
Patient and patient husband "pissed" due to their wait. Patient reports, "I have been waiting for this CT since 2pm. And I dont have any complaints about my service- everyone has been excellent since I got here. But the system is flawed and Im gonna raise a fuss about it! I understand some people have emergencies, but I suspect they put people in line for a CT before me, and I am furious! I am no less than any sick person here!" This RN explained the patient definitely is not being skipped, and that she certainly is not "less than" any other patient. This RN explained that the patient was waiting for a CT scan, and everyone's CT scans take a different amount of time. This RN further explained I have been by hourly to check on patient and update on delay. This RN validated patient feelings and paged MD Vanita Panda to stop by and speak with patient. Patient husband requesting patient be allowed to go home to follow up with CT. This RN states the patient and husband may ask MD about this. This RN checked with CT to see how much longer and found CT tech on her way to pick up patient. Patient and husband introduced to CT tech, and patient states "How much longer will this take?" CT tech states the CT should take approximately 15 minutes to complete, then we wait for the results. This RN explained we dont know how long the results will take, but that we could get the EDMD to update the patient as soon as possible. Then, Dr. Vanita Panda walked by the open door and introduced himself as the oncoming MD. He stated he will be back to update the patient after she returns from CT.

## 2019-02-07 NOTE — ED Notes (Signed)
Patient returned from radiology. Patient reports needing to use the restroom. Patient ambulated to restroom without assistance. Patient complaining of increased shortness of breath up return to the room. Patient VSS. Patient updated on next steps, including waiting for radiology report and MD updated on lab work. Patient and husband verbalize understanding. Denies further needs at this time.

## 2019-02-07 NOTE — ED Provider Notes (Signed)
6:33 PM Patient is awake, alert, in no distress. With continued difficulty breathing, but with reassuring results, no evidence for ACS, PE, pneumonia, now following results of remaining studies including CT scan, patient is appropriate for discharge. Discussed possibilities for her dyspnea, including likelihood of COPD exacerbation given her history of smoking.  Line patient will start a short course of steroids, will follow-up with primary care.   Carmin Muskrat, MD 02/07/19 [phone]

## 2019-02-07 NOTE — Telephone Encounter (Signed)
Called pateitn back and let her know what Dr Renold Genta said, she verbalized understanding and will go later this afternoon when she has someone to drive her, I also stated that if she continued having breathing issues or gets worse she might want to cal 911 instead of waiting.

## 2019-02-07 NOTE — ED Triage Notes (Signed)
Pt reports cough and Sob for over week that isnt relieved with her inhaler or neb treatments at home. Her PCP advised to go to ED for evaluation. C/o SOB even at rest. Pt c/o body aches also

## 2019-02-07 NOTE — ED Notes (Signed)
Patient provided with dinner tray. Patient complaining of continued headache. MD made aware.

## 2019-02-07 NOTE — Telephone Encounter (Signed)
Mr Bello called to let you know that patient is at [facility].

## 2019-02-09 NOTE — Telephone Encounter (Signed)
She is on prednisone and can come next Tuesday for follow up

## 2019-02-09 NOTE — Telephone Encounter (Signed)
Appointment scheduled.

## 2019-02-09 NOTE — Telephone Encounter (Signed)
Spoke with patient she is doing okay she said started working inside on "some plants" and she got short of breath but went to sit down and right she is resting. She said she was told to come in this week to see you.

## 2019-02-15 ENCOUNTER — Other Ambulatory Visit: Payer: Self-pay

## 2019-02-15 ENCOUNTER — Ambulatory Visit: Payer: Medicare Other | Admitting: Internal Medicine

## 2019-02-15 ENCOUNTER — Encounter: Payer: Self-pay | Admitting: Internal Medicine

## 2019-02-15 VITALS — BP 120/60 | HR 68 | Temp 98.2°F | Ht 61.0 in | Wt 106.0 lb

## 2019-02-15 DIAGNOSIS — J449 Chronic obstructive pulmonary disease, unspecified: Secondary | ICD-10-CM | POA: Diagnosis not present

## 2019-02-15 MED ORDER — DOXYCYCLINE HYCLATE 100 MG PO TABS: 100.0000 mg | ORAL_TABLET | Freq: Two times a day (BID) | ORAL | 0 refills | Status: DC

## 2019-02-15 MED ORDER — BELSOMRA 5 MG PO TABS: 5.0000 mg | ORAL_TABLET | Freq: Every day | ORAL | 1 refills | Status: DC

## 2019-02-17 ENCOUNTER — Other Ambulatory Visit: Payer: Self-pay | Admitting: Internal Medicine

## 2019-02-17 MED ORDER — IPRATROPIUM BROMIDE 0.02 % IN SOLN: 0.5000 mg | Freq: Four times a day (QID) | RESPIRATORY_TRACT | 12 refills | Status: DC

## 2019-02-17 NOTE — Telephone Encounter (Signed)
Refill this for a year 

## 2019-02-17 NOTE — Telephone Encounter (Signed)
[name] Micheletti [phone]  [name] called to say that Katherine [name] needs a refill on the medication that goes in her nebulizer.

## 2019-03-12 NOTE — Progress Notes (Signed)
   Subjective:    Patient ID: Katherine [name], female    DOB: 1928/10/20, 85 y.o.   MRN: [id number]  HPI 85 year old Female recently seen in the emergency department for cough and congestion.  History of COPD.  Here today for follow-up.  Feeling much better.  She had CT angios of chest August 24 showing no evidence of pulmonary embolism but severe centrilobular and paraseptal emphysematous changes throughout the lungs mostly in the upper lobes.  Had features of chronic bronchitis.  6 mm nodule in the superior segment of right lower lobe and follow-up CT recommended in 6 to 12 months.  This was done because of elevated d-dimer of 0.66.  Her white blood cell count was normal in the emergency department.  Her potassium was slightly low at 3.4.  Coronavirus test was negative.  Was noted to be tachycardic in triage at 111.  However pulse improved during her time in the emergency department to the 80s.  Was noted to have a murmur.  She did have an echocardiogram January 2019 showing moderate aortic stenosis with mild aortic regurgitation.  She had pulmonary function test by Dr. Lamonte Sakai in January 2020 showing no significant response to bronchodilator therapy and moderate severe airways disease.  He has her on Anoro 1 spray daily she has albuterol as a rescue inhaler.    Review of Systems     Objective:   Physical Exam Vital signs reviewed.  Skin warm and dry.  Nodes none.  Neck is supple.  She has a holosystolic murmur.  Chest is clear to auscultation without rales or wheezing.  No lower extremity edema.       Assessment & Plan:  History of moderately severe COPD treated by pulmonology  History of moderate aortic stenosis-just being followed at her age  ED follow-up for COPD exacerbation-currently stable and doing better  Due for Medicare wellness visit and health maintenance exam February 2021.  Recommend annual flu vaccine.

## 2019-03-12 NOTE — Patient Instructions (Signed)
Continue Anoro inhaler daily and albuterol inhaler as needed.  Call if symptoms recur.  Physical exam and Medicare wellness visit due February 2021.

## 2019-03-22 ENCOUNTER — Other Ambulatory Visit: Payer: Self-pay | Admitting: Internal Medicine

## 2019-03-22 DIAGNOSIS — Z1231 Encounter for screening mammogram for malignant neoplasm of breast: Secondary | ICD-10-CM

## 2019-03-27 ENCOUNTER — Other Ambulatory Visit: Payer: Self-pay | Admitting: Internal Medicine

## 2019-04-25 ENCOUNTER — Other Ambulatory Visit: Payer: Self-pay | Admitting: Internal Medicine

## 2019-04-25 MED ORDER — ALPRAZOLAM 0.25 MG PO TABS: 0.2500 mg | ORAL_TABLET | Freq: Two times a day (BID) | ORAL | 5 refills | Status: DC | PRN

## 2019-04-25 NOTE — Telephone Encounter (Signed)
Lashante Gibbon [id number]  [id number] - [name], [place] [phone] (Phone) [phone] (Fax)   ALPRAZolam Duanne Moron) 0.25 MG tablet  [name] called to see if he could get a refill for [name] on the above listed medication.

## 2019-05-05 ENCOUNTER — Ambulatory Visit
Admission: RE | Admit: 2019-05-05 | Discharge: 2019-05-05 | Disposition: A | Discharge status: Home / Self Care | Payer: Medicare Other | Source: Ambulatory Visit | Attending: Internal Medicine | Admitting: Internal Medicine

## 2019-05-05 ENCOUNTER — Other Ambulatory Visit: Payer: Self-pay

## 2019-05-05 DIAGNOSIS — Z1231 Encounter for screening mammogram for malignant neoplasm of breast: Secondary | ICD-10-CM

## 2019-05-23 ENCOUNTER — Ambulatory Visit (INDEPENDENT_AMBULATORY_CARE_PROVIDER_SITE_OTHER): Payer: Medicare Other | Admitting: Adult Health

## 2019-05-23 ENCOUNTER — Telehealth: Payer: Self-pay | Admitting: Internal Medicine

## 2019-05-23 ENCOUNTER — Encounter: Payer: Self-pay | Admitting: Adult Health

## 2019-05-23 ENCOUNTER — Telehealth: Payer: Self-pay | Admitting: Emergency Medicine

## 2019-05-23 DIAGNOSIS — J449 Chronic obstructive pulmonary disease, unspecified: Secondary | ICD-10-CM

## 2019-05-23 MED ORDER — ALBUTEROL SULFATE (2.5 MG/3ML) 0.083% IN NEBU: INHALATION_SOLUTION | RESPIRATORY_TRACT | 5 refills | Status: DC

## 2019-05-23 MED ORDER — PREDNISONE 10 MG PO TABS: 20.0000 mg | ORAL_TABLET | Freq: Every day | ORAL | 0 refills | Status: DC

## 2019-05-23 NOTE — Telephone Encounter (Signed)
Scheduled appointment

## 2019-05-23 NOTE — Telephone Encounter (Signed)
Katherine [name] Fronczak [phone]  Katherine [name] called to say that [name] is needing to come in to be seen, she is expericing real bad back pain.

## 2019-05-23 NOTE — Progress Notes (Signed)
Virtual Visit via Telephone Note  I connected with Katherine [name] on 05/23/19 at  4:30 PM EST by telephone and verified that I am speaking with the correct person using two identifiers.  Location: Patient: Home Provider: Office   I discussed the limitations, risks, security and privacy concerns of performing an evaluation and management service by telephone and the availability of in person appointments. I also discussed with the patient that there may be a patient responsible charge related to this service. The patient expressed understanding and agreed to proceed.   History of Present Illness: 85 year old female former smoker followed for COPD Medical history significant for positive PPD  Today's televisit is an acute office visit for COPD.  She complains that breathing is getting progressive worse. Over last year gets more winded with minimal activity . Having trouble doing light chores and self care. Has to rest often. No significant cough or wheezing . Remains on ANORO daily . Uses Ipratropium Nebs As needed  . No fever, chest pain, orthopnea or edema. No weight loss or hemoptysis . No known sick contacts, no loss of taste or smell.  Has some dyspnea at rest , worse with minimal activity .  CT chest August 2020 neg for PE , severe Emphysema.      Observations/Objective:  05/23/2019 speaks in full sentences but sound dyspneic with walking.  No audible wheezing.   FEV1 0.94 (66) FVC 1.72 (89) Ratio 54% RV 131% TLC 106% DLCO/VA 40 % pred  Assessment and Plan: Suspected progressive COPD with emphysema.  We will give a short course of empiric steroids low-dose.  Set up overnight oximetry to test for nocturnal hypoxemia.  Have close follow-up in 1 week and will check oxygen on return.  May try albuterol and ipratropium nebulizer twice a day for 5 days then as needed.  Continue on Anoro 1 puff daily.  Patient is advised if symptoms do not improve or worsen she will need to  seek emergency care.  Plan  Patient Instructions  Begin Prednisone 20mg  daily for 5 days .  Set up for Overnight oximetry test .  May buy pulse oximetry to check oxygen level , goal is to be >88-90% .  Begin Albuterol and Ipratropium neb Twice daily  For 5 days  Then As needed  For wheezing/shortness of breath .  Continue on Anoro 1 puff daily .  Activity as tolerated.  Please contact office for sooner follow up if symptoms do not improve or worsen or seek emergency care  Follow up in 1 week in office with Dr. Lamonte Sakai  Or Parrett NP and As needed        Follow Up Instructions:   Follow-up in 1 week and as needed  Please contact office for sooner follow up if symptoms do not improve or worsen or seek emergency care   I discussed the assessment and treatment plan with the patient. The patient was provided an opportunity to ask questions and all were answered. The patient agreed with the plan and demonstrated an understanding of the instructions.   The patient was advised to call back or seek an in-person evaluation if the symptoms worsen or if the condition fails to improve as anticipated.  I provided 26  minutes of non-face-to-face time during this encounter.   Rexene Edison, NP

## 2019-05-23 NOTE — Patient Instructions (Addendum)
Begin Prednisone 20mg  daily for 5 days .  Set up for Overnight oximetry test .  May buy pulse oximetry to check oxygen level , goal is to be >88-90% .  Begin Albuterol and Ipratropium neb Twice daily  For 5 days  Then As needed  For wheezing/shortness of breath .  Continue on Anoro 1 puff daily .  Activity as tolerated.  Please contact office for sooner follow up if symptoms do not improve or worsen or seek emergency care  Follow up in 1 week in office with Dr. Lamonte Sakai  Or Parrett NP and As needed

## 2019-05-23 NOTE — Telephone Encounter (Signed)
See tomorrow

## 2019-05-23 NOTE — Telephone Encounter (Signed)
Spoke with the pt's spouse  He states pt having increased SOB over the past several days  She is using her albuterol inhaler and nebs with little relief  Telephone visit with TP today at 4:30  They did not want to do a video visit

## 2019-05-24 ENCOUNTER — Encounter: Payer: Self-pay | Admitting: Internal Medicine

## 2019-05-24 ENCOUNTER — Ambulatory Visit (INDEPENDENT_AMBULATORY_CARE_PROVIDER_SITE_OTHER): Payer: Medicare Other | Admitting: Internal Medicine

## 2019-05-24 ENCOUNTER — Ambulatory Visit
Admission: RE | Admit: 2019-05-24 | Discharge: 2019-05-24 | Disposition: A | Discharge status: Home / Self Care | Payer: Medicare Other | Source: Ambulatory Visit | Attending: Internal Medicine | Admitting: Internal Medicine

## 2019-05-24 ENCOUNTER — Other Ambulatory Visit: Payer: Self-pay

## 2019-05-24 VITALS — BP 160/80 | HR 105 | Temp 97.9°F | Ht 61.0 in | Wt 109.0 lb

## 2019-05-24 DIAGNOSIS — J449 Chronic obstructive pulmonary disease, unspecified: Secondary | ICD-10-CM

## 2019-05-24 DIAGNOSIS — M533 Sacrococcygeal disorders, not elsewhere classified: Secondary | ICD-10-CM

## 2019-05-24 DIAGNOSIS — M545 Low back pain, unspecified: Secondary | ICD-10-CM

## 2019-05-24 DIAGNOSIS — R52 Pain, unspecified: Secondary | ICD-10-CM | POA: Diagnosis not present

## 2019-05-24 DIAGNOSIS — F329 Major depressive disorder, single episode, unspecified: Secondary | ICD-10-CM

## 2019-05-24 DIAGNOSIS — G8929 Other chronic pain: Secondary | ICD-10-CM

## 2019-05-24 DIAGNOSIS — F419 Anxiety disorder, unspecified: Secondary | ICD-10-CM

## 2019-05-24 NOTE — Progress Notes (Signed)
   Subjective:    Patient ID: Katherine [name], female    DOB: 1928-12-13, 85 y.o.   MRN: [id number]  HPI Has gained 4 pounds since since July.  She continues to have considerable low back pain.  She sees pain management physician at [facility].  Not able to do housework.  Not able to do activities that she used to.  This is depressing to her.  She has COPD and since seen pulmonologist to change her medication regimen she is doing much better with that.  She was seen in the emergency department in August with an exacerbation of COPD.  In February 2016 she had posterior lumbar interbody fusion at L4-L5.  In October 2016 she had transforaminal lumbar interbody fusion L3-L4 and posterior lumbar fusion L3-S1.  In 2017 she had removal of hardware at L5-S1.    Review of Systems     Objective:   Physical Exam Weight is 109 pounds.  BMI 20.60.  Pulse is 105.  Pulse oximetry 92% on room air. Straight leg raising negative at 90 degrees bilaterally and muscle strength is normal in the lower extremities.  She is complaining of pain around her sacrum and coccyx.      Assessment & Plan:  Chronic low back pain requiring chronic narcotic medication.  She would like for me to take over prescribing her pain medication.  I think it is best that she stay with pain management physicians who are experts in treating pain.  I will ask her to visit with them and let them know of her concerns.  COPD improved with Trelegy  Depression due to back pain-continue with Vistaril which she has taken for many years 150 mg at bedtime.  She also has been on Viibryd  Anxiety treated with low-dose Xanax twice daily  Plan: She will have x-ray of sacrum and coccyx.  Addendum: There was no evidence of fractures but she had diffuse osteopenia.  She was noted to have lower posterior fusion hardware around L3-L4.  She has anterolisthesis L4 on L5.  Has severe disc height loss at L5-S1.  She may be a candidate  for physical therapy and/or?  Epidural injection.  She should check with pain management physician or perhaps Dr. Saintclair Halsted, her neurosurgeon.

## 2019-05-26 ENCOUNTER — Encounter: Payer: Self-pay | Admitting: Adult Health

## 2019-05-26 LAB — SEDIMENTATION RATE: Sed Rate: 28 mm/h (ref 0–30)

## 2019-05-26 LAB — COMPLETE METABOLIC PANEL WITH GFR
AG Ratio: 1.6 (calc) (ref 1.0–2.5)
ALT: 10 U/L (ref 6–29)
AST: 21 U/L (ref 10–35)
Albumin: 4.1 g/dL (ref 3.6–5.1)
Alkaline phosphatase (APISO): 56 U/L (ref 37–153)
BUN/Creatinine Ratio: 19 (calc) (ref 6–22)
BUN: 19 mg/dL (ref 7–25)
CO2: 21 mmol/L (ref 20–32)
Calcium: 9.6 mg/dL (ref 8.6–10.4)
Chloride: 107 mmol/L (ref 98–110)
Creat: 1.01 mg/dL — ABNORMAL HIGH (ref 0.60–0.88)
GFR, Est African American: 58 mL/min/{1.73_m2} — ABNORMAL LOW (ref >=60)
GFR, Est Non African American: 50 mL/min/{1.73_m2} — ABNORMAL LOW (ref >=60)
Globulin: 2.6 g/dL (calc) (ref 1.9–3.7)
Glucose, Bld: 171 mg/dL — ABNORMAL HIGH (ref 65–99)
Potassium: 4.1 mmol/L (ref 3.5–5.3)
Sodium: 143 mmol/L (ref 135–146)
Total Bilirubin: 0.3 mg/dL (ref 0.2–1.2)
Total Protein: 6.7 g/dL (ref 6.1–8.1)

## 2019-05-26 LAB — PROTEIN ELECTROPHORESIS, SERUM
Albumin ELP: 3.8 g/dL (ref 3.8–4.8)
Alpha 1: 0.3 g/dL (ref 0.2–0.3)
Alpha 2: 0.8 g/dL (ref 0.5–0.9)
Beta 2: 0.3 g/dL (ref 0.2–0.5)
Beta Globulin: 0.5 g/dL (ref 0.4–0.6)
Gamma Globulin: 0.9 g/dL (ref 0.8–1.7)
Total Protein: 6.6 g/dL (ref 6.1–8.1)

## 2019-05-26 LAB — CBC WITH DIFFERENTIAL/PLATELET
Absolute Monocytes: 83 cells/uL — ABNORMAL LOW (ref 200–950)
Basophils Absolute: 18 cells/uL (ref 0–200)
Basophils Relative: 0.3 %
Eosinophils Absolute: 12 cells/uL — ABNORMAL LOW (ref 15–500)
Eosinophils Relative: 0.2 %
HCT: 34.2 % — ABNORMAL LOW (ref 35.0–45.0)
Hemoglobin: 11.1 g/dL — ABNORMAL LOW (ref 11.7–15.5)
Lymphs Abs: 437 cells/uL — ABNORMAL LOW (ref 850–3900)
MCH: 28.2 pg (ref 27.0–33.0)
MCHC: 32.5 g/dL (ref 32.0–36.0)
MCV: 86.8 fL (ref 80.0–100.0)
MPV: 10.4 fL (ref 7.5–12.5)
Monocytes Relative: 1.4 %
Neutro Abs: 5351 cells/uL (ref 1500–7800)
Neutrophils Relative %: 90.7 %
Platelets: 267 10*3/uL (ref 140–400)
RBC: 3.94 10*6/uL (ref 3.80–5.10)
RDW: 14.2 % (ref 11.0–15.0)
Total Lymphocyte: 7.4 %
WBC: 5.9 10*3/uL (ref 3.8–10.8)

## 2019-05-26 LAB — TSH: TSH: 1.26 mIU/L (ref 0.40–4.50)

## 2019-05-28 ENCOUNTER — Other Ambulatory Visit: Payer: Self-pay | Admitting: Internal Medicine

## 2019-05-30 ENCOUNTER — Ambulatory Visit: Payer: Medicare Other | Admitting: Adult Health

## 2019-05-30 ENCOUNTER — Other Ambulatory Visit: Payer: Self-pay

## 2019-05-30 ENCOUNTER — Encounter: Payer: Self-pay | Admitting: Adult Health

## 2019-05-30 DIAGNOSIS — J449 Chronic obstructive pulmonary disease, unspecified: Secondary | ICD-10-CM | POA: Diagnosis not present

## 2019-05-30 MED ORDER — TRELEGY ELLIPTA 100-62.5-25 MCG/INH IN AEPB: 1.0000 | INHALATION_SPRAY | Freq: Every day | RESPIRATORY_TRACT | 0 refills | Status: DC

## 2019-05-30 MED ORDER — ALBUTEROL SULFATE HFA 108 (90 BASE) MCG/ACT IN AERS: INHALATION_SPRAY | RESPIRATORY_TRACT | 11 refills | Status: DC

## 2019-05-30 MED ORDER — TRELEGY ELLIPTA 100-62.5-25 MCG/INH IN AEPB: 1.0000 | INHALATION_SPRAY | Freq: Every day | RESPIRATORY_TRACT | 5 refills | Status: DC

## 2019-05-30 NOTE — Addendum Note (Signed)
Addended by: Tery Sanfilippo R on: 05/30/2019 04:08 PM   Modules accepted: Orders

## 2019-05-30 NOTE — Patient Instructions (Signed)
Stop ANORO and Symbicort . Please check your meds at home and stop these.  Begin TRELEGY 1 puff daily , rinse after use.  Activity as tolerated.  I will call with Oxygen results .  Follow up with Dr. Lamonte Sakai  Or Yohanna Tow NP in 2 months and As needed

## 2019-05-30 NOTE — Assessment & Plan Note (Signed)
Recent COPD flare.  Patient has underlying COPD with emphysema.  Suspect a lot of her symptoms are progressive COPD along with deconditioning.  She has no significant exertional desaturations.  An overnight oximetry test is pending and will follow on results.  Have asked her to increase her activity as tolerated.  She did have significant improvement with prednisone.  We will change Anoro to Trelegy to maximize triple therapy regimen.  May use nebulizer as needed  Plan  Patient Instructions  Stop ANORO and Symbicort . Please check your meds at home and stop these.  Begin TRELEGY 1 puff daily , rinse after use.  Activity as tolerated.  I will call with Oxygen results .  Follow up with Dr. Lamonte Sakai  Or Ronnett Pullin NP in 2 months and As needed

## 2019-05-30 NOTE — Progress Notes (Signed)
@Patient  ID: Katherine [name], female    DOB: [birth year], 85 y.o.   MRN: [id number]  Chief Complaint  Patient presents with  . Follow-up    COPD     Referring provider: Elby Showers, MD  HPI: 85 year old female former smoker followed for COPD Medical history significant for positive PPD  TEST/EVENTS :  FEV1 0.94 (66) FVC 1.72 (89) Ratio 54% RV 131% TLC 106% DLCO/VA 40 % pred  05/30/2019 Follow up : COPD  Patient returns for a 1 week follow up . She was seen via telemedicine visit last week for COPD flare with increased shortness of breath with activity says she has lower activity and exercise endurance.  She is on Anoro daily.  Last visit she was given prednisone 40 mg daily for 5 days.  Instructed to use her nebulizer as needed.  Patient says since last visit she is starting to feel better.  Feels like the prednisone is a miracle drug.  She says she does light activity but wears out of breath easily.  She denies any chest pain increased cough congestion or calf pain.  She has no hemoptysis or fever. Walk test in the office showed O2 saturations 89 to 96% on room air. An overnight oximetry test has been set up and results are pending. CT chest August 2020 showed severe emphysema, chronic bronchitis and a 6 mm nodule right lower lobe.   No Known Allergies  Immunization History  Administered Date(s) Administered  . Influenza Split 02/20/2018  . Influenza Whole 01/29/2010  . Influenza, High Dose Seasonal PF 04/13/2014, 02/12/2016, 03/10/2017, 03/19/2018, 04/11/2019  . Influenza-Unspecified 03/16/2014  . Pneumococcal Conjugate-13 05/12/2016  . Pneumococcal Polysaccharide-23 08/08/1996  . Tdap 04/10/2003    Past Medical History:  Diagnosis Date  . Alcoholic ([place])    36 years sober  . Allergy   . Anemia   . Arthritis   . Asthma    patient denies  . B12 deficiency   . COPD (chronic obstructive pulmonary disease) ([name])   . Depression    not recent  .  Diverticulosis   . Esophageal stricture   . Headache    cluster headaches occasionally  . Hepatic cyst 01/30/10  . Hiatal hernia 1985   patient unaware  . Hx of adenomatous colonic polyps 2006  . Hyperlipidemia   . IBS (irritable bowel syndrome)   . PONV (postoperative nausea and vomiting)    no nausea or vomitting after surgery 07/2014  . Positive PPD   . PUD (peptic ulcer disease)   . Shortness of breath dyspnea    SOB WITH EXERTION   (NOT NEW)    Tobacco History: Social History   Tobacco Use  Smoking Status Former Smoker  . Years: 60.00  . Types: Cigarettes  . Quit date: 02/02/2010  . Years since quitting: 9.3  Smokeless Tobacco Never Used   Counseling given: Not Answered   Outpatient Medications Prior to Visit  Medication Sig Dispense Refill  . acetaminophen (TYLENOL) 325 MG tablet Take 650 mg by mouth every 6 (six) hours as needed for moderate pain.    Marland Kitchen albuterol (PROVENTIL) (2.5 MG/3ML) 0.083% nebulizer solution 1 vial in neb every 4-6 hours as needed  Dx: J44.9 150 mL 5  . albuterol (VENTOLIN HFA) 108 (90 Base) MCG/ACT inhaler INHALE 2 PUFFS INTO THE LUNGS 4 TIMES A DAY AS NEEDED (Patient taking differently: Inhale 2 puffs into the lungs 4 (four) times daily as needed for wheezing or shortness of breath. )  18 g 11  . ALPRAZolam (XANAX) 0.25 MG tablet Take 1 tablet (0.25 mg total) by mouth 2 (two) times daily as needed for anxiety. 30 tablet 5  . atorvastatin (LIPITOR) 10 MG tablet TAKE 1 TABLET (10 MG TOTAL) BY MOUTH DAILY. (Patient taking differently: Take 10 mg by mouth daily. ) 90 tablet 3  . B Complex Vitamins (B-COMPLEX/B-12 TR PO) Take 1 tablet by mouth daily.    . cholecalciferol (VITAMIN D) 1000 UNITS tablet Take 1,000 Units by mouth daily.    Marland Kitchen ipratropium (ATROVENT) 0.02 % nebulizer solution Take 2.5 mLs (0.5 mg total) by nebulization 4 (four) times daily. 75 mL 12  . PATADAY 0.2 % SOLN Place 1 drop into both eyes daily as needed (for dry, allergy eyes).   4    . promethazine (PHENERGAN) 12.5 MG tablet Take 1 tablet (12.5 mg total) by mouth every 8 (eight) hours as needed for nausea or vomiting. 30 tablet 3  . Suvorexant (BELSOMRA) 5 MG TABS Take 5 mg by mouth at bedtime. 30 tablet 1  . SYMBICORT 160-4.5 MCG/ACT inhaler TAKE 2 PUFFS BY MOUTH TWICE A DAY 30.6 Inhaler 1  . traZODone (DESYREL) 50 MG tablet TAKE 3 TABLETS BY MOUTH AT BEDTIME 270 tablet 2  . umeclidinium-vilanterol (ANORO ELLIPTA) 62.5-25 MCG/INH AEPB TAKE 1 PUFF BY MOUTH EVERY DAY (Patient taking differently: Inhale 1 puff into the lungs daily. ) 180 each 4  . Vilazodone HCl (VIIBRYD) 40 MG TABS TAKE 1 TABLET (40 MG TOTAL) BY MOUTH DAILY. (Patient taking differently: Take 40 mg by mouth daily. TAKE 1 TABLET (40 MG TOTAL) BY MOUTH DAILY.) 30 tablet 5  . VITAMIN E PO Take 1 tablet by mouth daily.    . predniSONE (DELTASONE) 10 MG tablet Take 2 tablets (20 mg total) by mouth daily with breakfast. (Patient not taking: Reported on 05/30/2019) 10 tablet 0   No facility-administered medications prior to visit.     Review of Systems:   Constitutional:   No  weight loss, night sweats,  Fevers, chills +, fatigue, or  lassitude.  HEENT:   No headaches,  Difficulty swallowing,  Tooth/dental problems, or  Sore throat,                No sneezing, itching, ear ache, nasal congestion, post nasal drip,   CV:  No chest pain,  Orthopnea, PND, swelling in lower extremities, anasarca, dizziness, palpitations, syncope.   GI  No heartburn, indigestion, abdominal pain, nausea, vomiting, diarrhea, change in bowel habits, loss of appetite, bloody stools.   Resp:  No chest wall deformity  Skin: no rash or lesions.  GU: no dysuria, change in color of urine, no urgency or frequency.  No flank pain, no hematuria   MS:  No joint pain or swelling.  No decreased range of motion.  No back pain.    Physical Exam  BP 138/64 (BP Location: Left Arm, Cuff Size: Normal)   Pulse 90   Temp (!) 97 F (36.1 C)  (Temporal)   Ht 5' 1.5" (1.562 m)   Wt 111 lb 12.8 oz (50.7 kg)   SpO2 96% Comment: RA  BMI 20.78 kg/m   GEN: A/Ox3; pleasant , NAD, elderly   HEENT:  [place]/AT, NOSE-clear, THROAT-clear, no lesions, no postnasal drip or exudate noted.   NECK:  Supple w/ fair ROM; no JVD; normal carotid impulses w/o bruits; no thyromegaly or nodules palpated; no lymphadenopathy.    RESP diminished breath sounds at bases with no  wheezing  no accessory muscle use, no dullness to percussion  CARD:  RRR, grade 2/6 systolic murmur no peripheral edema, pulses intact, no cyanosis or clubbing.  GI:   Soft & nt; nml bowel sounds; no organomegaly or masses detected.   Musco: Warm bil, no deformities or joint swelling noted.   Neuro: alert, no focal deficits noted.    Skin: Warm, no lesions or rashes    Lab Results:  CBC    Component Value Date/Time   WBC 5.9 05/24/2019 1537   RBC 3.94 05/24/2019 1537   HGB 11.1 (L) 05/24/2019 1537   HCT 34.2 (L) 05/24/2019 1537   PLT 267 05/24/2019 1537   MCV 86.8 05/24/2019 1537   MCH 28.2 05/24/2019 1537   MCHC 32.5 05/24/2019 1537   RDW 14.2 05/24/2019 1537   LYMPHSABS 437 (L) 05/24/2019 1537   MONOABS 0.5 02/07/2019 1327   EOSABS 12 (L) 05/24/2019 1537   BASOSABS 18 05/24/2019 1537    BMET    Component Value Date/Time   NA 143 05/24/2019 1537   K 4.1 05/24/2019 1537   CL 107 05/24/2019 1537   CO2 21 05/24/2019 1537   GLUCOSE 171 (H) 05/24/2019 1537   BUN 19 05/24/2019 1537   CREATININE 1.01 (H) 05/24/2019 1537   CALCIUM 9.6 05/24/2019 1537   GFRNONAA 50 (L) 05/24/2019 1537   GFRAA 58 (L) 05/24/2019 1537    BNP No results found for: BNP  ProBNP No results found for: PROBNP  Imaging:     PFT Results Latest Ref Rng & Units 06/22/2018  FVC-Pre L 1.72  FVC-Predicted Pre % 89  FVC-Post L 1.74  FVC-Predicted Post % 89  Pre FEV1/FVC % % 54  Post FEV1/FCV % % 56  FEV1-Pre L 0.94  FEV1-Predicted Pre % 66  FEV1-Post L 0.97  DLCO UNC% % 25    DLCO COR %Predicted % 40  TLC L 4.91  TLC % Predicted % 106  RV % Predicted % 131    No results found for: NITRICOXIDE      Assessment & Plan:   COPD (chronic obstructive pulmonary disease) Recent COPD flare.  Patient has underlying COPD with emphysema.  Suspect a lot of her symptoms are progressive COPD along with deconditioning.  She has no significant exertional desaturations.  An overnight oximetry test is pending and will follow on results.  Have asked her to increase her activity as tolerated.  She did have significant improvement with prednisone.  We will change Anoro to Trelegy to maximize triple therapy regimen.  May use nebulizer as needed  Plan  Patient Instructions  Stop ANORO and Symbicort . Please check your meds at home and stop these.  Begin TRELEGY 1 puff daily , rinse after use.  Activity as tolerated.  I will call with Oxygen results .  Follow up with Dr. Lamonte Sakai  Or Arash Karstens NP in 2 months and As needed           Rexene Edison, NP 05/30/2019

## 2019-06-03 ENCOUNTER — Telehealth: Payer: Self-pay | Admitting: Adult Health

## 2019-06-03 NOTE — Telephone Encounter (Signed)
Patient seen by Tammy NP on 12.7.2020 and ONO on room air was ordered to check for nocturnal desaturations.  12.10.2020 ONO results received and reviewed by Tammy NP: no significant desats at bedtime; no O2 indicated.  Called spoke with patient and discussed ONO results as stated by [facility] NP above.  Patient voiced her understanding and denied any further questions or concerns.    ONO sent for scan. Nothing further needed at this time; will sign off.

## 2019-06-13 ENCOUNTER — Telehealth: Payer: Self-pay

## 2019-06-13 NOTE — Telephone Encounter (Signed)
Patient is requesting a visit wants to discuss not going to the pain clinic anymore they were prescribing oxycodone but she doesn't take it anymore instead she wants you to prescribe tramadol it is working better for her.

## 2019-06-13 NOTE — Telephone Encounter (Signed)
Set up appointment late next week to discuss. The issue is I went to a lot of effort to get her in pain management so I think it is best she continue there. If tramadol stops working, they can be of help. If she stops going, they will not take her back. I stepped out of the box a bit by giving her Tramadol and they may not be pleased that I did that.

## 2019-06-13 NOTE — Telephone Encounter (Signed)
Called and spoke with patient, she is going to keep pain management appointment and discuss with them, she will call back if she still needs an appointment.

## 2019-06-21 DIAGNOSIS — M961 Postlaminectomy syndrome, not elsewhere classified: Secondary | ICD-10-CM | POA: Diagnosis not present

## 2019-06-21 DIAGNOSIS — M47818 Spondylosis without myelopathy or radiculopathy, sacral and sacrococcygeal region: Secondary | ICD-10-CM | POA: Diagnosis not present

## 2019-06-21 DIAGNOSIS — M4316 Spondylolisthesis, lumbar region: Secondary | ICD-10-CM | POA: Diagnosis not present

## 2019-06-21 DIAGNOSIS — M47816 Spondylosis without myelopathy or radiculopathy, lumbar region: Secondary | ICD-10-CM | POA: Diagnosis not present

## 2019-06-23 ENCOUNTER — Telehealth: Payer: Self-pay

## 2019-06-23 ENCOUNTER — Emergency Department (HOSPITAL_BASED_OUTPATIENT_CLINIC_OR_DEPARTMENT_OTHER)
Admission: EM | Admit: 2019-06-23 | Discharge: 2019-06-23 | Disposition: A | Discharge status: Home / Self Care | Payer: Medicare PPO | Attending: Emergency Medicine | Admitting: Emergency Medicine

## 2019-06-23 ENCOUNTER — Encounter (HOSPITAL_BASED_OUTPATIENT_CLINIC_OR_DEPARTMENT_OTHER): Payer: Self-pay | Admitting: Emergency Medicine

## 2019-06-23 ENCOUNTER — Emergency Department (HOSPITAL_BASED_OUTPATIENT_CLINIC_OR_DEPARTMENT_OTHER): Payer: Medicare PPO

## 2019-06-23 ENCOUNTER — Telehealth: Payer: Self-pay | Admitting: Internal Medicine

## 2019-06-23 ENCOUNTER — Other Ambulatory Visit: Payer: Self-pay

## 2019-06-23 DIAGNOSIS — S32010A Wedge compression fracture of first lumbar vertebra, initial encounter for closed fracture: Secondary | ICD-10-CM | POA: Insufficient documentation

## 2019-06-23 DIAGNOSIS — S299XXA Unspecified injury of thorax, initial encounter: Secondary | ICD-10-CM | POA: Diagnosis not present

## 2019-06-23 DIAGNOSIS — S3991XA Unspecified injury of abdomen, initial encounter: Secondary | ICD-10-CM | POA: Diagnosis not present

## 2019-06-23 DIAGNOSIS — Y939 Activity, unspecified: Secondary | ICD-10-CM | POA: Insufficient documentation

## 2019-06-23 DIAGNOSIS — R109 Unspecified abdominal pain: Secondary | ICD-10-CM | POA: Diagnosis not present

## 2019-06-23 DIAGNOSIS — Z79899 Other long term (current) drug therapy: Secondary | ICD-10-CM | POA: Diagnosis not present

## 2019-06-23 DIAGNOSIS — J449 Chronic obstructive pulmonary disease, unspecified: Secondary | ICD-10-CM | POA: Diagnosis not present

## 2019-06-23 DIAGNOSIS — Y92003 Bedroom of unspecified non-institutional (private) residence as the place of occurrence of the external cause: Secondary | ICD-10-CM | POA: Insufficient documentation

## 2019-06-23 DIAGNOSIS — S3992XA Unspecified injury of lower back, initial encounter: Secondary | ICD-10-CM | POA: Diagnosis present

## 2019-06-23 DIAGNOSIS — W228XXA Striking against or struck by other objects, initial encounter: Secondary | ICD-10-CM | POA: Diagnosis not present

## 2019-06-23 DIAGNOSIS — S20221A Contusion of right back wall of thorax, initial encounter: Secondary | ICD-10-CM | POA: Insufficient documentation

## 2019-06-23 DIAGNOSIS — Y999 Unspecified external cause status: Secondary | ICD-10-CM | POA: Diagnosis not present

## 2019-06-23 DIAGNOSIS — Z87891 Personal history of nicotine dependence: Secondary | ICD-10-CM | POA: Diagnosis not present

## 2019-06-23 DIAGNOSIS — S199XXA Unspecified injury of neck, initial encounter: Secondary | ICD-10-CM | POA: Diagnosis not present

## 2019-06-23 DIAGNOSIS — R079 Chest pain, unspecified: Secondary | ICD-10-CM | POA: Diagnosis not present

## 2019-06-23 DIAGNOSIS — W06XXXA Fall from bed, initial encounter: Secondary | ICD-10-CM | POA: Diagnosis not present

## 2019-06-23 DIAGNOSIS — S0990XA Unspecified injury of head, initial encounter: Secondary | ICD-10-CM | POA: Diagnosis not present

## 2019-06-23 LAB — URINALYSIS, ROUTINE W REFLEX MICROSCOPIC
Bilirubin Urine: NEGATIVE
Glucose, UA: NEGATIVE mg/dL
Hgb urine dipstick: NEGATIVE
Ketones, ur: NEGATIVE mg/dL
Leukocytes,Ua: NEGATIVE
Nitrite: NEGATIVE
Protein, ur: NEGATIVE mg/dL
Specific Gravity, Urine: 1.01 (ref 1.005–1.030)
pH: 5.5 (ref 5.0–8.0)

## 2019-06-23 LAB — CBC WITH DIFFERENTIAL/PLATELET
Abs Immature Granulocytes: 0.04 10*3/uL (ref 0.00–0.07)
Basophils Absolute: 0 10*3/uL (ref 0.0–0.1)
Basophils Relative: 1 %
Eosinophils Absolute: 0 10*3/uL (ref 0.0–0.5)
Eosinophils Relative: 0 %
HCT: 33.9 % — ABNORMAL LOW (ref 36.0–46.0)
Hemoglobin: 10.5 g/dL — ABNORMAL LOW (ref 12.0–15.0)
Immature Granulocytes: 1 %
Lymphocytes Relative: 7 %
Lymphs Abs: 0.6 10*3/uL — ABNORMAL LOW (ref 0.7–4.0)
MCH: 27.3 pg (ref 26.0–34.0)
MCHC: 31 g/dL (ref 30.0–36.0)
MCV: 88.3 fL (ref 80.0–100.0)
Monocytes Absolute: 0.6 10*3/uL (ref 0.1–1.0)
Monocytes Relative: 7 %
Neutro Abs: 6.9 10*3/uL (ref 1.7–7.7)
Neutrophils Relative %: 84 %
Platelets: 228 10*3/uL (ref 150–400)
RBC: 3.84 MIL/uL — ABNORMAL LOW (ref 3.87–5.11)
RDW: 13.8 % (ref 11.5–15.5)
WBC: 8.1 10*3/uL (ref 4.0–10.5)
nRBC: 0 % (ref 0.0–0.2)

## 2019-06-23 LAB — COMPREHENSIVE METABOLIC PANEL
ALT: 15 U/L (ref 0–44)
AST: 26 U/L (ref 15–41)
Albumin: 3.6 g/dL (ref 3.5–5.0)
Alkaline Phosphatase: 57 U/L (ref 38–126)
Anion gap: 8 (ref 5–15)
BUN: 28 mg/dL — ABNORMAL HIGH (ref 8–23)
CO2: 24 mmol/L (ref 22–32)
Calcium: 8.9 mg/dL (ref 8.9–10.3)
Chloride: 106 mmol/L (ref 98–111)
Creatinine, Ser: 1.08 mg/dL — ABNORMAL HIGH (ref 0.44–1.00)
GFR calc Af Amer: 53 mL/min — ABNORMAL LOW (ref >60)
GFR calc non Af Amer: 46 mL/min — ABNORMAL LOW (ref >60)
Glucose, Bld: 122 mg/dL — ABNORMAL HIGH (ref 70–99)
Potassium: 3.6 mmol/L (ref 3.5–5.1)
Sodium: 138 mmol/L (ref 135–145)
Total Bilirubin: 0.4 mg/dL (ref 0.3–1.2)
Total Protein: 6.4 g/dL — ABNORMAL LOW (ref 6.5–8.1)

## 2019-06-23 LAB — TROPONIN I (HIGH SENSITIVITY)
Troponin I (High Sensitivity): 4 ng/L (ref <18)
Troponin I (High Sensitivity): 5 ng/L (ref <18)

## 2019-06-23 MED ORDER — HYDROCODONE-ACETAMINOPHEN 5-325 MG PO TABS: 1.0000 | ORAL_TABLET | Freq: Four times a day (QID) | ORAL | 0 refills | Status: DC | PRN

## 2019-06-23 MED ORDER — IOHEXOL 300 MG/ML  SOLN
100.0000 mL | Freq: Once | INTRAMUSCULAR | Status: AC | PRN
  Administered 2019-06-23: 100 mL via INTRAVENOUS

## 2019-06-23 MED ORDER — ALBUTEROL SULFATE HFA 108 (90 BASE) MCG/ACT IN AERS
2.0000 | INHALATION_SPRAY | Freq: Once | RESPIRATORY_TRACT | Status: AC
  Administered 2019-06-23: 2 via RESPIRATORY_TRACT
  Filled 2019-06-23: qty 6.7

## 2019-06-23 MED ORDER — FENTANYL CITRATE (PF) 100 MCG/2ML IJ SOLN
50.0000 ug | Freq: Once | INTRAMUSCULAR | Status: AC
  Administered 2019-06-23: 50 ug via INTRAVENOUS
  Filled 2019-06-23: qty 2

## 2019-06-23 MED ORDER — HYDROCODONE-ACETAMINOPHEN 5-325 MG PO TABS
1.0000 | ORAL_TABLET | Freq: Once | ORAL | Status: AC
  Administered 2019-06-23: 1 via ORAL
  Filled 2019-06-23: qty 1

## 2019-06-23 NOTE — ED Triage Notes (Signed)
Fell last night while trying to get into bed. She hit her back on the side table. C/o back pain. Denies LOC. No blood thinners.

## 2019-06-23 NOTE — Telephone Encounter (Signed)
Best to take her to ED to get [facility] and checked out. Either Katherine [name] or [facility].

## 2019-06-23 NOTE — ED Notes (Signed)
Assisted to bedside commode, pt tolerated well, sat 92% room air, no increase in respiratory rate, no shortness of breath with exertion.

## 2019-06-23 NOTE — ED Notes (Signed)
PT asked for urine sample but stated she could not go at this time. PT told to call out when she can provide one.

## 2019-06-23 NOTE — Progress Notes (Signed)
Orthopedic Tech Progress Note Patient Details:  Katherine [name] 24-Mar-1929 [id number] Called in order to [facility] for a TLSO brace for patient Patient ID: Barkley Boards Sahni, female   DOB: Feb 18, 1929, 86 y.o.   MRN: [id number]   Janit Pagan 06/23/2019, 3:53 PM

## 2019-06-23 NOTE — ED Provider Notes (Signed)
Patient got TLSO placed and her pain is well controlled.  She had a brief time with some mild hypoxia which likely was related to her splinting.  She does not report any shortness of breath.  She currently has consistent oxygen saturations in the upper 90s on room air with no increased work of breathing or tachypnea.  No reports of shortness of breath.  Her imaging studies show an L1 mild compression fracture but no other identified injuries.  She was discharged home in good condition.  She was given a short course of Vicodin.  She was previously getting oxycodone from Dr. Hardin Negus with the last prescription being in November.  She says she currently is not getting opioid prescriptions from him and I do not see any filled since November.  I will give her prescription for 15 tablets but I did advise her to call their office in the morning and notify them of the prescription.  She was encouraged to follow-up with Dr. Saintclair Halsted who is her spine doctor.  Return precautions were given.   Malvin Johns, MD 06/23/19 [phone]

## 2019-06-23 NOTE — Telephone Encounter (Signed)
Spoke with [name], he is at the [place] with [name], they let him know she has a broken bone in her back, still waiting to find out more. [name] also said that he is going to need [place] when she comes home. Dr Renold Genta recommended [place]

## 2019-06-23 NOTE — ED Notes (Signed)
Pt being fitted for TLSO brace.  Husband updated on condition.

## 2019-06-23 NOTE — ED Provider Notes (Signed)
[facility] EMERGENCY DEPARTMENT Provider Note   CSN: [id number] Arrival date & time: 06/23/19  1108     History Chief Complaint  Patient presents with  . Fall    Katherine [name] is a 86 y.o. female.  The history is provided by the patient and medical records. No language interpreter was used.  Fall   Katherine [name] is a 86 y.o. female who presents to the Emergency Department complaining of fall.  She had a fall getting into bed last night had struck the bedside table.  She is unsure if she hit her head.  She has significant pain to her low back, right shoulder/chest pain, right hip pain.  She has associated sob.  She has some lower abdominal pain (described as a radiation from her back).   Denies HA, neck pain, fever, cough.  Pain is significantly worse with movement.  No known covid19 exposures.  Lives at home with her husband.    Past Medical History:  Diagnosis Date  . Alcoholic ([place])    36 years sober  . Allergy   . Anemia   . Arthritis   . Asthma    patient denies  . B12 deficiency   . COPD (chronic obstructive pulmonary disease) ([place])   . Depression    not recent  . Diverticulosis   . Esophageal stricture   . Headache    cluster headaches occasionally  . Hepatic cyst 01/30/10  . Hiatal hernia 1985   patient unaware  . Hx of adenomatous colonic polyps 2006  . Hyperlipidemia   . IBS (irritable bowel syndrome)   . PONV (postoperative nausea and vomiting)    no nausea or vomitting after surgery 07/2014  . Positive PPD   . PUD (peptic ulcer disease)   . Shortness of breath dyspnea    SOB WITH EXERTION   (NOT NEW)    Patient Active Problem List   Diagnosis Date Noted  . Aortic stenosis 07/16/2017  . Painful orthopaedic hardware ([place]) 12/13/2015  . Closed Colles' fracture of left radius 06/14/2015  . Spinal stenosis of lumbar region 03/21/2015  . Spondylolisthesis at L4-L5 level 08/02/2014  . History of non anemic vitamin B12  deficiency 01/02/2013  . History 01/02/2013  . History of pyloroplasty 01/02/2013  . History of Helicobacter pylori infection 01/02/2013  . History of depression 01/02/2013  . Recovering alcoholic in remission ([place]) 01/02/2013  . History of positive PPD 01/02/2013  . Anemia, iron deficiency 11/15/2012  . COPD (chronic obstructive pulmonary disease) ([place]) 07/06/2012  . Hyperlipidemia 12/24/2010  . GE reflux 12/24/2010  . Chronic back pain 12/24/2010    Past Surgical History:  Procedure Laterality Date  . ABDOMINAL HYSTERECTOMY    . APPENDECTOMY    . BARTHOLIN GLAND CYST EXCISION     x2  . BREAST BIOPSY Bilateral    Milk glnd  . CATARACT EXTRACTION W/ INTRAOCULAR LENS  IMPLANT, BILATERAL Bilateral   . COLONOSCOPY    . COLONOSCOPY W/ POLYPECTOMY    . HARDWARE REMOVAL N/A 12/13/2015   Procedure: Removal of HARDWARE  Lumbar Five-Sacral One ;  Surgeon: Kary Kos, MD;  Location: [place] NEURO ORS;  Service: Neurosurgery;  Laterality: N/A;  . UPPER GI ENDOSCOPY    . VAGOTOMY AND PYLOROPLASTY     in [place],      OB History   No obstetric history on file.     Family History  Problem Relation Age of Onset  . Ulcers Father   .  Stroke Father   . Irritable bowel syndrome Father   . Ovarian cancer Sister   . Colon cancer Neg Hx     Social History   Tobacco Use  . Smoking status: Former Smoker    Years: 60.00    Types: Cigarettes    Quit date: 02/02/2010    Years since quitting: 9.3  . Smokeless tobacco: Never Used  Substance Use Topics  . Alcohol use: No    Comment: recovering alcoholic     35 YRS    . Drug use: No    Home Medications Prior to Admission medications   Medication Sig Start Date End Date Taking? Authorizing Provider  acetaminophen (TYLENOL) 325 MG tablet Take 650 mg by mouth every 6 (six) hours as needed for moderate pain.    [provider]  albuterol (PROVENTIL) (2.5 MG/3ML) 0.083% nebulizer solution 1 vial in neb every 4-6 hours as needed   Dx: J44.9 05/23/19   Parrett, Tammy S, NP  albuterol (VENTOLIN HFA) 108 (90 Base) MCG/ACT inhaler INHALE 2 PUFFS INTO THE LUNGS 4 TIMES A DAY AS NEEDED 05/30/19   Parrett, Fonnie Mu, NP  ALPRAZolam (XANAX) 0.25 MG tablet Take 1 tablet (0.25 mg total) by mouth 2 (two) times daily as needed for anxiety. 04/25/19   Elby Showers, MD  atorvastatin (LIPITOR) 10 MG tablet TAKE 1 TABLET (10 MG TOTAL) BY MOUTH DAILY. Patient taking differently: Take 10 mg by mouth daily.  04/20/18   Elby Showers, MD  B Complex Vitamins (B-COMPLEX/B-12 TR PO) Take 1 tablet by mouth daily.    [provider]  cholecalciferol (VITAMIN D) 1000 UNITS tablet Take 1,000 Units by mouth daily.    [provider]  Fluticasone-Umeclidin-Vilant (TRELEGY ELLIPTA) 100-62.5-25 MCG/INH AEPB Inhale 1 puff into the lungs daily. 05/30/19   Parrett, Fonnie Mu, NP  Fluticasone-Umeclidin-Vilant (TRELEGY ELLIPTA) 100-62.5-25 MCG/INH AEPB Inhale 1 puff into the lungs daily. 05/30/19   Parrett, Tammy S, NP  ipratropium (ATROVENT) 0.02 % nebulizer solution Take 2.5 mLs (0.5 mg total) by nebulization 4 (four) times daily. 02/17/19   Elby Showers, MD  PATADAY 0.2 % SOLN Place 1 drop into both eyes daily as needed (for dry, allergy eyes).  10/10/15   [provider]  promethazine (PHENERGAN) 12.5 MG tablet Take 1 tablet (12.5 mg total) by mouth every 8 (eight) hours as needed for nausea or vomiting. 08/03/18   Baxley, Cresenciano Lick, MD  Suvorexant (BELSOMRA) 5 MG TABS Take 5 mg by mouth at bedtime. 02/15/19   Elby Showers, MD  SYMBICORT 160-4.5 MCG/ACT inhaler TAKE 2 PUFFS BY MOUTH TWICE A DAY 03/28/19   Elby Showers, MD  traZODone (DESYREL) 50 MG tablet TAKE 3 TABLETS BY MOUTH AT BEDTIME 05/30/19   Elby Showers, MD  umeclidinium-vilanterol (ANORO ELLIPTA) 62.5-25 MCG/INH AEPB TAKE 1 PUFF BY MOUTH EVERY DAY Patient taking differently: Inhale 1 puff into the lungs daily.  12/27/18   Collene Gobble, MD  Vilazodone HCl (VIIBRYD) 40 MG  TABS TAKE 1 TABLET (40 MG TOTAL) BY MOUTH DAILY. Patient taking differently: Take 40 mg by mouth daily. TAKE 1 TABLET (40 MG TOTAL) BY MOUTH DAILY. 08/16/18   Elby Showers, MD  VITAMIN E PO Take 1 tablet by mouth daily.    [provider]    Allergies    Patient has no known allergies.  Review of Systems   Review of Systems  All other systems reviewed and are negative.  Physical Exam Updated Vital Signs BP 125/84 (BP Location: Right Arm)   Pulse 81   Temp 98.2 F (36.8 C) (Oral)   Resp (!) 28   Ht 5\' 1"  (1.549 m)   Wt 47.6 kg   SpO2 90%   BMI 19.84 kg/m   Physical Exam Vitals and nursing note reviewed.  Constitutional:      Appearance: She is well-developed.  HENT:     Head: Normocephalic and atraumatic.  Cardiovascular:     Rate and Rhythm: Normal rate and regular rhythm.     Heart sounds: No murmur.  Pulmonary:     Effort: Pulmonary effort is normal. No respiratory distress.     Comments: TTP with overlying ecchymosis to right posterior and axillary chest.  Decreased air movement in right lung fields.  Chest:     Chest wall: Tenderness present.  Abdominal:     Palpations: Abdomen is soft.     Tenderness: There is no abdominal tenderness. There is no guarding or rebound.  Musculoskeletal:        General: No tenderness.     Comments: 2+ DP pulses bilaterally.  No C/T spine tenderness to palpation.  Moderate mid and lower lumbar tenderness to palpation.    Skin:    General: Skin is warm and dry.  Neurological:     Mental Status: She is alert and oriented to person, place, and time.     Comments: 5/5 strength in BUE.  5/5 strength in RLE.  5/5 strength in LLE.    Psychiatric:        Behavior: Behavior normal.     ED Results / Procedures / Treatments   Labs (all labs ordered are listed, but only abnormal results are displayed) Labs Reviewed  COMPREHENSIVE METABOLIC PANEL - Abnormal; Notable for the following components:      Result Value   Glucose,  Bld 122 (*)    BUN 28 (*)    Creatinine, Ser 1.08 (*)    Total Protein 6.4 (*)    GFR calc non Af Amer 46 (*)    GFR calc Af Amer 53 (*)    All other components within normal limits  CBC WITH DIFFERENTIAL/PLATELET - Abnormal; Notable for the following components:   RBC 3.84 (*)    Hemoglobin 10.5 (*)    HCT 33.9 (*)    Lymphs Abs 0.6 (*)    All other components within normal limits  URINALYSIS, ROUTINE W REFLEX MICROSCOPIC  TROPONIN I (HIGH SENSITIVITY)  TROPONIN I (HIGH SENSITIVITY)    EKG EKG Interpretation  Date/Time:  Thursday June 23 2019 12:39:40 EST Ventricular Rate:  86 PR Interval:    QRS Duration: 109 QT Interval:  405 QTC Calculation: 485 R Axis:   67 Text Interpretation: Sinus rhythm Borderline low voltage, extremity leads Borderline ST depression, lateral leads Baseline wander in lead(s)I, II, III, aVL, aVF,  V6 Confirmed by Quintella Reichert [phone]) on 06/23/2019 12:41:54 PM   Radiology CT Head Wo Contrast  Result Date: 06/23/2019 CLINICAL DATA:  Fall, head trauma EXAM: CT HEAD WITHOUT CONTRAST TECHNIQUE: Contiguous axial images were obtained from the base of the skull through the vertex without intravenous contrast. COMPARISON:  MRI 06/23/2007 FINDINGS: Brain: No evidence of acute infarction, hemorrhage, hydrocephalus, extra-axial collection or mass lesion/mass effect. Scattered low-density changes within the periventricular and subcortical white matter compatible with chronic microvascular ischemic change. Mild-moderate diffuse cerebral volume loss. Vascular: Mild atherosclerotic calcifications involving the large vessels of the skull base. No unexpected  hyperdense vessel. Skull: Normal. Negative for fracture or focal lesion. Sinuses/Orbits: Mild mucosal thickening within the left maxillary sinus. Remaining paranasal sinuses and mastoid air cells are clear. Orbital structures are within normal limits. Other: None. IMPRESSION: 1. No acute intracranial abnormality. 2.  Chronic microvascular ischemic changes and cerebral volume loss. Electronically Signed   By: Davina Poke D.O.   On: 06/23/2019 14:25   CT Chest W Contrast  Result Date: 06/23/2019 CLINICAL DATA:  Abdominal trauma and back pain secondary to a fall this morning at home. EXAM: CT CHEST, ABDOMEN, AND PELVIS WITH CONTRAST TECHNIQUE: Multidetector CT imaging of the chest, abdomen and pelvis was performed following the standard protocol during bolus administration of intravenous contrast. CONTRAST:  137mL OMNIPAQUE IOHEXOL 300 MG/ML  SOLN COMPARISON:  CT scan of the abdomen and pelvis dated 05/07/2015 and CT scan of the chest dated 02/07/2019 FINDINGS: CT CHEST FINDINGS Cardiovascular: Extensive aortic atherosclerosis. Coronary artery calcifications. Heart size is normal. No pericardial effusion. Mediastinum/Nodes: Multiple small thyroid nodules, unchanged. No hilar or mediastinal adenopathy. Trachea and esophagus appear normal. Multiple surgical clips at the gastroesophageal junction, likely from previous vagotomy. Lungs/Pleura: Extensive emphysema in the upper lobes. Stable 6 mm nodule in the right lower lobe laterally. Slight scarring at the lung bases posteriorly, stable. No effusions. Musculoskeletal: No chest wall mass or suspicious bone lesions identified. Old mild compression fracture of the superior endplate of T8. Old Schmorl's node in the superior endplate of [id number]. CT ABDOMEN PELVIS FINDINGS Hepatobiliary: Multiple small benign-appearing liver cysts. Liver parenchyma is otherwise normal. Biliary tree is normal. Pancreas: Unremarkable. No pancreatic ductal dilatation or surrounding inflammatory changes. Spleen: Normal in size without focal abnormality. Adrenals/Urinary Tract: Adrenal glands and kidneys demonstrate no significant abnormality. There are several tiny cysts in the left kidney. No hydronephrosis. Bladder appears normal. Stomach/Bowel: There are rare diverticula in the distal colon. Bowel  otherwise appears normal. Appendectomy. Vascular/Lymphatic: Extensive aortic atherosclerosis. No adenopathy. Reproductive: Status post hysterectomy. No adnexal masses. Other: There is a tiny amount of free fluid in the pelvic cul-de-sac. No abdominal wall hernias. Musculoskeletal: There is a new mild compression fracture of the superior endplate of L1 which was not present on the prior chest CT dated 02/07/2019. Chronic degenerative changes in the lumbar spine with previous fusions from L3-L5. IMPRESSION: 1. New mild compression fracture of the superior endplate of L1 since [id number]. 2. No other acute abnormalities of the chest, abdomen, or pelvis. 3. Aortic Atherosclerosis (ICD10-I70.0) and Emphysema (ICD10-J43.9). Electronically Signed   By: Lorriane Shire M.D.   On: 06/23/2019 14:39   CT Cervical Spine Wo Contrast  Result Date: 06/23/2019 CLINICAL DATA:  Fall EXAM: CT CERVICAL SPINE WITHOUT CONTRAST TECHNIQUE: Multidetector CT imaging of the cervical spine was performed without intravenous contrast. Multiplanar CT image reconstructions were also generated. COMPARISON:  None. FINDINGS: Alignment: There is degenerative anterolisthesis at C2-C3 and C3-C4 and retrolisthesis at C4-C5 and C5-C6. Skull base and vertebrae: No acute fracture. Vertebral body heights are maintained. Soft tissues and spinal canal: No prevertebral fluid or swelling. No visible canal hematoma. Disc levels: Multilevel degenerative changes are present including disc space narrowing, endplate osteophytes, and facet and uncovertebral hypertrophy. There is no high-grade osseous encroachment on the spinal canal. Multilevel neural foraminal stenosis is present. Upper chest: Advanced emphysema. Bilateral thyroid nodules measuring up to 1.1 cm; follow-up is not recommended per current guidelines. Other: None. IMPRESSION: No acute cervical spine fracture. Electronically Signed   By: Macy Mis M.D.   On: 06/23/2019 14:31  CT Abdomen Pelvis W  Contrast  Result Date: 06/23/2019 CLINICAL DATA:  Abdominal trauma and back pain secondary to a fall this morning at home. EXAM: CT CHEST, ABDOMEN, AND PELVIS WITH CONTRAST TECHNIQUE: Multidetector CT imaging of the chest, abdomen and pelvis was performed following the standard protocol during bolus administration of intravenous contrast. CONTRAST:  165mL OMNIPAQUE IOHEXOL 300 MG/ML  SOLN COMPARISON:  CT scan of the abdomen and pelvis dated 05/07/2015 and CT scan of the chest dated 02/07/2019 FINDINGS: CT CHEST FINDINGS Cardiovascular: Extensive aortic atherosclerosis. Coronary artery calcifications. Heart size is normal. No pericardial effusion. Mediastinum/Nodes: Multiple small thyroid nodules, unchanged. No hilar or mediastinal adenopathy. Trachea and esophagus appear normal. Multiple surgical clips at the gastroesophageal junction, likely from previous vagotomy. Lungs/Pleura: Extensive emphysema in the upper lobes. Stable 6 mm nodule in the right lower lobe laterally. Slight scarring at the lung bases posteriorly, stable. No effusions. Musculoskeletal: No chest wall mass or suspicious bone lesions identified. Old mild compression fracture of the superior endplate of T8. Old Schmorl's node in the superior endplate of QA348G. CT ABDOMEN PELVIS FINDINGS Hepatobiliary: Multiple small benign-appearing liver cysts. Liver parenchyma is otherwise normal. Biliary tree is normal. Pancreas: Unremarkable. No pancreatic ductal dilatation or surrounding inflammatory changes. Spleen: Normal in size without focal abnormality. Adrenals/Urinary Tract: Adrenal glands and kidneys demonstrate no significant abnormality. There are several tiny cysts in the left kidney. No hydronephrosis. Bladder appears normal. Stomach/Bowel: There are rare diverticula in the distal colon. Bowel otherwise appears normal. Appendectomy. Vascular/Lymphatic: Extensive aortic atherosclerosis. No adenopathy. Reproductive: Status post hysterectomy. No adnexal  masses. Other: There is a tiny amount of free fluid in the pelvic cul-de-sac. No abdominal wall hernias. Musculoskeletal: There is a new mild compression fracture of the superior endplate of L1 which was not present on the prior chest CT dated 02/07/2019. Chronic degenerative changes in the lumbar spine with previous fusions from L3-L5. IMPRESSION: 1. New mild compression fracture of the superior endplate of L1 since [id number]. 2. No other acute abnormalities of the chest, abdomen, or pelvis. 3. Aortic Atherosclerosis (ICD10-I70.0) and Emphysema (ICD10-J43.9). Electronically Signed   By: Lorriane Shire M.D.   On: 06/23/2019 14:39    Procedures Procedures (including critical care time)  Medications Ordered in ED Medications  HYDROcodone-acetaminophen (NORCO/VICODIN) 5-325 MG per tablet 1 tablet (1 tablet Oral Given 06/23/19 1311)  iohexol (OMNIPAQUE) 300 MG/ML solution 100 mL (100 mLs Intravenous Contrast Given 06/23/19 1345)  fentaNYL (SUBLIMAZE) injection 50 mcg (50 mcg Intravenous Given 06/23/19 1504)    ED Course  I have reviewed the triage vital signs and the nursing notes.  Pertinent labs & imaging results that were available during my care of the patient were reviewed by me and considered in my medical decision making (see chart for details).    MDM Rules/Calculators/A&P                     Patient here for evaluation of injuries following a mechanical fall that occurred last night at home. She does have a contusion to her right posterior thorax. She initially had some left lower extremity weakness that she reported was due to a pulling in the back of her leg, on repeat strength testing she has symmetric strength in all four extremities. She had minimal improvement in her pain with Norco. Will provide dose of fentanyl. Patient was hypoxic on ED presentation, felt that this was likely due to splinting. On repeat assessment after heart the Norco her oxygen saturations  had improved. CT scan  demonstrates L1 compression fracture. Will get TLSO for pain control. Patient care transferred pending urinalysis, ambulation trial. Anticipate discharge home if patient can ambulate safely and comfortably at home, hypoxia is resolved with pain control.  Final Clinical Impression(s) / ED Diagnoses Final diagnoses:  None    Rx / DC Orders ED Discharge Orders    None       Quintella Reichert, MD 06/23/19 [phone]

## 2019-06-23 NOTE — Telephone Encounter (Signed)
Spoke with [name] and let him know that Dr Renold Genta wants him to take her to [place] or [place] to be checked out. He verbalized understanding

## 2019-06-23 NOTE — Telephone Encounter (Signed)
Patient fell out of the bed last night around 2am she fell on her tailbone and she has trouble walking and standing up she is afraid she broke something. Katherine [name] would like to know what to do.  [phone]

## 2019-06-23 NOTE — Telephone Encounter (Signed)
She had a fall at home today. Upon our advice was taken to [place] and had numerous CT scans. Has compression fracture L1 superior endplate. Husband called to say he would need some home help assistance. Says she is being fitted for back brace

## 2019-06-27 ENCOUNTER — Telehealth: Payer: Self-pay | Admitting: Internal Medicine

## 2019-06-27 MED ORDER — HYDROCODONE-ACETAMINOPHEN 5-325 MG PO TABS: 1.0000 | ORAL_TABLET | Freq: Four times a day (QID) | ORAL | 0 refills | Status: DC | PRN

## 2019-06-27 NOTE — Telephone Encounter (Signed)
Katherine [name] called to say that Katherine [name] is completely out of her pain medicine, she needs some to get her by until she sees Dr Saintclair Halsted on Tuesday at 9:45.  [facility] [address]

## 2019-06-27 NOTE — Telephone Encounter (Signed)
Patient has always used pain med responsibly. Has very p[ainful compression fracture. Have refilled Hydrocodone APAP 5/325 #15 as requested. Has appt to see Dr. Saintclair Halsted tomorrow.

## 2019-06-28 DIAGNOSIS — S32010A Wedge compression fracture of first lumbar vertebra, initial encounter for closed fracture: Secondary | ICD-10-CM | POA: Diagnosis not present

## 2019-06-28 DIAGNOSIS — Z681 Body mass index (BMI) 19 or less, adult: Secondary | ICD-10-CM | POA: Diagnosis not present

## 2019-06-28 DIAGNOSIS — R030 Elevated blood-pressure reading, without diagnosis of hypertension: Secondary | ICD-10-CM | POA: Diagnosis not present

## 2019-06-29 DIAGNOSIS — J449 Chronic obstructive pulmonary disease, unspecified: Secondary | ICD-10-CM | POA: Diagnosis not present

## 2019-06-30 NOTE — Telephone Encounter (Signed)
We can recommend [facility] or [facility]. Do they have a preference? We will need to call agency and make referral today

## 2019-06-30 NOTE — Telephone Encounter (Signed)
Katherine [name] called to say Katherine [name] had seen Dr Saintclair Halsted and she is scheduled for MRI on Tuesday and sees Dr Saintclair Halsted on Thursday of next week. In the meantime Katherine [name] is unable to get out of bed, go to bathroom , dress or bath without assistance. He is unable to keep doing all of this. Wants to know if she could go to assisted living or if they could get home health that insurance would pay for.

## 2019-07-01 NOTE — Telephone Encounter (Signed)
Called and spoke with [facility], they do not have anyone that can go out for everyday living assistance. Spoke with Anthron at [facility] and he is going to call and speak with Mr. Mainwaring. They do have someone that can come out, however insurance does not pay for everyday living assistance, but they do have payment options. They also do not need orders for everyday living assistance. I called and spoke with [name] and let him know that someone from [facility] is going to call him to discuss what assistance [name] needs and their options.

## 2019-07-05 DIAGNOSIS — S32010A Wedge compression fracture of first lumbar vertebra, initial encounter for closed fracture: Secondary | ICD-10-CM | POA: Diagnosis not present

## 2019-07-05 DIAGNOSIS — M545 Low back pain: Secondary | ICD-10-CM | POA: Diagnosis not present

## 2019-07-07 DIAGNOSIS — S32010A Wedge compression fracture of first lumbar vertebra, initial encounter for closed fracture: Secondary | ICD-10-CM | POA: Diagnosis not present

## 2019-07-08 ENCOUNTER — Other Ambulatory Visit (HOSPITAL_COMMUNITY): Payer: Self-pay | Admitting: Interventional Radiology

## 2019-07-08 DIAGNOSIS — S32010A Wedge compression fracture of first lumbar vertebra, initial encounter for closed fracture: Secondary | ICD-10-CM

## 2019-07-09 NOTE — Patient Instructions (Signed)
Recommend sacral x-ray.  May benefit from physical therapy.  Discussed your concerns about your medication with chronic pain management physicians.  Perhaps some type of epidural injection could be tried.

## 2019-07-12 ENCOUNTER — Other Ambulatory Visit: Payer: Self-pay | Admitting: Radiology

## 2019-07-13 ENCOUNTER — Ambulatory Visit (HOSPITAL_COMMUNITY)
Admission: RE | Admit: 2019-07-13 | Discharge: 2019-07-13 | Disposition: A | Discharge status: Home / Self Care | Payer: Medicare PPO | Source: Ambulatory Visit | Attending: Interventional Radiology | Admitting: Interventional Radiology

## 2019-07-13 ENCOUNTER — Other Ambulatory Visit: Payer: Self-pay

## 2019-07-13 DIAGNOSIS — K589 Irritable bowel syndrome without diarrhea: Secondary | ICD-10-CM | POA: Diagnosis not present

## 2019-07-13 DIAGNOSIS — W19XXXA Unspecified fall, initial encounter: Secondary | ICD-10-CM | POA: Insufficient documentation

## 2019-07-13 DIAGNOSIS — J449 Chronic obstructive pulmonary disease, unspecified: Secondary | ICD-10-CM | POA: Diagnosis not present

## 2019-07-13 DIAGNOSIS — Z79899 Other long term (current) drug therapy: Secondary | ICD-10-CM | POA: Diagnosis not present

## 2019-07-13 DIAGNOSIS — Z7951 Long term (current) use of inhaled steroids: Secondary | ICD-10-CM | POA: Diagnosis not present

## 2019-07-13 DIAGNOSIS — M1990 Unspecified osteoarthritis, unspecified site: Secondary | ICD-10-CM | POA: Insufficient documentation

## 2019-07-13 DIAGNOSIS — S32010A Wedge compression fracture of first lumbar vertebra, initial encounter for closed fracture: Secondary | ICD-10-CM | POA: Diagnosis not present

## 2019-07-13 DIAGNOSIS — Z87891 Personal history of nicotine dependence: Secondary | ICD-10-CM | POA: Diagnosis not present

## 2019-07-13 DIAGNOSIS — E785 Hyperlipidemia, unspecified: Secondary | ICD-10-CM | POA: Diagnosis not present

## 2019-07-13 DIAGNOSIS — E538 Deficiency of other specified B group vitamins: Secondary | ICD-10-CM | POA: Diagnosis not present

## 2019-07-13 DIAGNOSIS — M4856XA Collapsed vertebra, not elsewhere classified, lumbar region, initial encounter for fracture: Secondary | ICD-10-CM | POA: Diagnosis not present

## 2019-07-13 DIAGNOSIS — D649 Anemia, unspecified: Secondary | ICD-10-CM | POA: Insufficient documentation

## 2019-07-13 DIAGNOSIS — K279 Peptic ulcer, site unspecified, unspecified as acute or chronic, without hemorrhage or perforation: Secondary | ICD-10-CM | POA: Diagnosis not present

## 2019-07-13 HISTORY — PX: IR KYPHO LUMBAR INC FX REDUCE BONE BX UNI/BIL CANNULATION INC/IMAGING: IMG5519

## 2019-07-13 LAB — CBC
HCT: 36.4 % (ref 36.0–46.0)
Hemoglobin: 11.2 g/dL — ABNORMAL LOW (ref 12.0–15.0)
MCH: 26.8 pg (ref 26.0–34.0)
MCHC: 30.8 g/dL (ref 30.0–36.0)
MCV: 87.1 fL (ref 80.0–100.0)
Platelets: 248 10*3/uL (ref 150–400)
RBC: 4.18 MIL/uL (ref 3.87–5.11)
RDW: 13.9 % (ref 11.5–15.5)
WBC: 6.7 10*3/uL (ref 4.0–10.5)
nRBC: 0 % (ref 0.0–0.2)

## 2019-07-13 MED ORDER — SODIUM CHLORIDE 0.9 % IV SOLN: INTRAVENOUS | Status: DC

## 2019-07-13 MED ORDER — LIDOCAINE HCL 1 % IJ SOLN
INTRAMUSCULAR | Status: AC | PRN
  Administered 2019-07-13: 5 mL

## 2019-07-13 MED ORDER — CEFAZOLIN SODIUM-DEXTROSE 2-4 GM/100ML-% IV SOLN
INTRAVENOUS | Status: AC
  Administered 2019-07-13: 2 g via INTRAVENOUS
  Filled 2019-07-13: qty 100

## 2019-07-13 MED ORDER — MIDAZOLAM HCL 2 MG/2ML IJ SOLN
INTRAMUSCULAR | Status: AC | PRN
  Administered 2019-07-13: 1 mg via INTRAVENOUS
  Administered 2019-07-13: 0.5 mg via INTRAVENOUS

## 2019-07-13 MED ORDER — OXYCODONE HCL 5 MG PO TABS
5.0000 mg | ORAL_TABLET | Freq: Once | ORAL | Status: AC
  Filled 2019-07-13: qty 1

## 2019-07-13 MED ORDER — OXYCODONE HCL 5 MG PO TABS
ORAL_TABLET | ORAL | Status: AC
  Administered 2019-07-13: 13:00:00 5 mg via ORAL
  Filled 2019-07-13: qty 1

## 2019-07-13 MED ORDER — MIDAZOLAM HCL 2 MG/2ML IJ SOLN
INTRAMUSCULAR | Status: AC
  Filled 2019-07-13: qty 2

## 2019-07-13 MED ORDER — CEFAZOLIN SODIUM-DEXTROSE 2-4 GM/100ML-% IV SOLN: 2.0000 g | Freq: Once | INTRAVENOUS | Status: AC

## 2019-07-13 MED ORDER — FENTANYL CITRATE (PF) 100 MCG/2ML IJ SOLN
INTRAMUSCULAR | Status: AC
  Filled 2019-07-13: qty 2

## 2019-07-13 MED ORDER — LIDOCAINE HCL (PF) 1 % IJ SOLN
INTRAMUSCULAR | Status: AC
  Filled 2019-07-13: qty 30

## 2019-07-13 MED ORDER — IOHEXOL 300 MG/ML  SOLN
50.0000 mL | Freq: Once | INTRAMUSCULAR | Status: AC | PRN
  Administered 2019-07-13: 10 mL

## 2019-07-13 MED ORDER — FENTANYL CITRATE (PF) 100 MCG/2ML IJ SOLN
INTRAMUSCULAR | Status: AC | PRN
  Administered 2019-07-13: 25 ug via INTRAVENOUS
  Administered 2019-07-13: 50 ug via INTRAVENOUS

## 2019-07-13 NOTE — Progress Notes (Signed)
Discharge instructions reviewed with patient and family. Verbalized understanding. 

## 2019-07-13 NOTE — Discharge Instructions (Addendum)
Percutaneous Vertebroplasty, Care After This sheet gives you information about how to care for yourself after your procedure. Your doctor may also give you more specific instructions. If you have problems or questions, contact your doctor. Follow these instructions at home: Surgical cut (incision) care  Follow instructions from your doctor about how to take care of your cut from surgery. Make sure you: ? Wash your hands with soap and water before you change your bandage (dressing). If you cannot use soap and water, use hand sanitizer. ? Change your bandage as told by your doctor. ? Leave stitches (sutures), skin glue, or skin tape (adhesive) strips in place. They may need to stay in place for 2 weeks or longer. If tape strips get loose and curl up, you may trim the loose edges. Do not remove tape strips completely unless your doctor says it is okay.  Check your surgical cut area every day for signs of infection. Check for: ? Redness, swelling, or pain. ? Fluid or blood. ? Warmth. ? Pus or a bad smell.  Keep the bandage dry as told by your doctor. Do not shower or bathe until your doctor says it is okay. Managing pain, stiffness, and swelling   If directed, apply ice to the affected area: ? Put ice in a plastic bag. ? Place a towel between your skin and bag. ? Leave the ice on for 20 minutes, 2-3 times a day.  Rest for 24 hrs after the procedure or as told by your doctor. Activity  Slowly return to normal activities as told by your doctor.  Ask what type of stretching and strengthening exercises you should do.  Do not bend or lift anything greater than 10 lb (4.5 kg). Follow your doctor's instructions about bending and lifting. General instructions  Take over-the-counter and prescription medicines only as told by your doctor.  Do not drive for 24 hours if you were given a medicine to help you relax (sedative).  To prevent or treat constipation while you are taking prescription  pain medicine, your doctor may recommend that you: ? Drink enough fluid to keep your pee (urine) clear or pale yellow. ? Take over-the-counter or prescription medicines. ? Eat foods that are high in fiber, such as:  Fresh fruits.  Fresh vegetables.  Whole grains.  Beans. ? Limit foods that are high in fat and processed sugars, such as fried and sweet foods. ? Keep all follow-up visits as told by your doctor. This is important. Contact a doctor if:  You have redness, swelling, or pain around your cut.  You have fluid or blood coming from your cut.  Your cut feels warm to the touch.  You have pus or bad smell coming from your cut.  You have a fever.  You are sick to your stomach (nauseous) or throw up (vomit) for more than 24 hours.  Your back pain does not get better. Get help right away if:  You have very bad back pain that comes on all of a sudden.  You cannot control when you pee or poop (bowel movement).  You lose feeling (become numb) or have tingling in your legs or feet, or they become weak.  You have new tingling, numbness, or weakness in your legs or feet.  You have sudden weakness in your legs.  You have pain that shoots down your legs.  You have chest pain.  You have trouble breathing.  You are short of breath.  You feel dizzy or you pass   out (faint).  Your vision changes or you cannot talk as you normally do. Summary  Rest for 24 hrs after the procedure and return slowly to normal activities as told by your doctor.  Do not drive for 24 hours if you were given a medicine to help you relax (sedative).  Take over-the-counter and prescription medicines only as told by your doctor. This information is not intended to replace advice given to you by your health care provider. Make sure you discuss any questions you have with your health care provider. Document Revised: 05/15/2017 Document Reviewed: 09/02/2016 [employer] Patient Education  2020 [employer] Balloon Kyphoplasty, Care After This sheet gives you information about how to care for yourself after your procedure. Your health care provider may also give you more specific instructions. If you have problems or questions, contact your health care provider. What can I expect after the procedure? After your procedure, it is common to have back pain. Follow these instructions at home: Medicines  Take over-the-counter and prescription medicines only as told by your health care provider.  Ask your health care provider if the medicine prescribed to you: ? Requires you to avoid driving or using heavy machinery. ? Can cause constipation. You may need to take steps to prevent or treat constipation, such as:  Drink enough fluid to keep your urine pale yellow.  Take over-the-counter or prescription medicines.  Eat foods that are high in fiber, such as beans, whole grains, and fresh fruits and vegetables.  Limit foods that are high in fat and processed sugars, such as fried or sweet foods. Puncture site care   Follow instructions from your health care provider about how to take care of your puncture site. Make sure you: ? Wash your hands with soap and water before and after you change your bandage (dressing). If soap and water are not available, use hand sanitizer. ? Change your dressing as told by your health care provider. ? Leave skin glue or adhesive strips in place. These skin closures may need to be in place for 2 weeks or longer. If adhesive strip edges start to loosen and curl up, you may trim the loose edges. Do not remove adhesive strips completely unless your health care provider tells you to do that.  Check your puncture site every day for signs of infection. Watch for: ? Redness, swelling, or pain. ? Fluid or blood. ? Warmth. ? Pus or a bad smell.  Keep your dressing dry until your health care provider says that it can be removed. Managing pain, stiffness, and  swelling   If directed, put ice on the painful area. ? Put ice in a plastic bag. ? Place a towel between your skin and the bag. ? Leave the ice on for 20 minutes, 2-3 times a day. Activity  Rest your back and avoid intense physical activity for as long as told by your health care provider.  Avoid bending, lifting, or twisting your back for as long as told by your health care provider.  Return to your normal activities as told by your health care provider. Ask your health care provider what activities are safe for you.  Do not lift anything that is heavier than 5 lb (2.2 kg). You may need to avoid heavy lifting for several weeks. General instructions  Do not use any products that contain nicotine or tobacco, such as cigarettes, e-cigarettes, and chewing tobacco. These can delay bone healing. If you need help quitting, ask your health care   provider.  Do not drive for 24 hours if you were given a sedative during your procedure.  Keep all follow-up visits as told by your health care provider. This is important. Contact a health care provider if:  You have a fever or chills.  You have redness, swelling, or pain at the site of your puncture.  You have fluid, blood, or pus coming from the puncture site.  You have pain that gets worse or does not get better with medicine.  You develop numbness or weakness in any part of your body. Get help right away if:  You have chest pain.  You have difficulty breathing.  You have weakness, numbness, or tingling in your legs.  You cannot control your bladder or bowel movements.  You suddenly become weak or numb on one side of your body.  You become very confused.  You have trouble speaking or understanding, or both. Summary  Follow instructions from your health care provider about how to take care of your puncture site.  Take over-the-counter and prescription medicines only as told by your health care provider.  Rest your back and  avoid intense physical activity for as long as told by your health care provider.  Contact a health care provider if you have pain that gets worse or does not get better with medicine.  Keep all follow-up visits as told by your health care provider. This is important. This information is not intended to replace advice given to you by your health care provider. Make sure you discuss any questions you have with your health care provider. Document Revised: 05/10/2018 Document Reviewed: 05/10/2018 [employer] Patient Education  2020 [employer] Moderate Conscious Sedation, Adult Sedation is the use of medicines to promote relaxation and relieve discomfort and anxiety. Moderate conscious sedation is a type of sedation. Under moderate conscious sedation, you are less alert than normal, but you are still able to respond to instructions, touch, or both. Moderate conscious sedation is used during short medical and dental procedures. It is milder than deep sedation, which is a type of sedation under which you cannot be easily woken up. It is also milder than general anesthesia, which is the use of medicines to make you unconscious. Moderate conscious sedation allows you to return to your regular activities sooner. Tell a health care provider about:  Any allergies you have.  All medicines you are taking, including vitamins, herbs, eye drops, creams, and over-the-counter medicines.  Use of steroids (by mouth or creams).  Any problems you or family members have had with sedatives and anesthetic medicines.  Any blood disorders you have.  Any surgeries you have had.  Any medical conditions you have, such as sleep apnea.  Whether you are pregnant or may be pregnant.  Any use of cigarettes, alcohol, marijuana, or street drugs. What are the risks? Generally, this is a safe procedure. However, problems may occur, including:  Getting too much medicine (oversedation).  Nausea.  Allergic reaction to  medicines.  Trouble breathing. If this happens, a breathing tube may be used to help with breathing. It will be removed when you are awake and breathing on your own.  Heart trouble.  Lung trouble. What happens before the procedure? Staying hydrated Follow instructions from your health care provider about hydration, which may include:  Up to 2 hours before the procedure - you may continue to drink clear liquids, such as water, clear fruit juice, black coffee, and plain tea. Eating and drinking restrictions Follow instructions from   your health care provider about eating and drinking, which may include:  8 hours before the procedure - stop eating heavy meals or foods such as meat, fried foods, or fatty foods.  6 hours before the procedure - stop eating light meals or foods, such as toast or cereal.  6 hours before the procedure - stop drinking milk or drinks that contain milk.  2 hours before the procedure - stop drinking clear liquids. Medicine Ask your health care provider about:  Changing or stopping your regular medicines. This is especially important if you are taking diabetes medicines or blood thinners.  Taking medicines such as aspirin and ibuprofen. These medicines can thin your blood. Do not take these medicines before your procedure if your health care provider instructs you not to.  Tests and exams  You will have a physical exam.  You may have blood tests done to show: ? How well your kidneys and liver are working. ? How well your blood can clot. General instructions  Plan to have someone take you home from the hospital or clinic.  If you will be going home right after the procedure, plan to have someone with you for 24 hours. What happens during the procedure?  An IV tube will be inserted into one of your veins.  Medicine to help you relax (sedative) will be given through the IV tube.  The medical or dental procedure will be performed. What happens after the  procedure?  Your blood pressure, heart rate, breathing rate, and blood oxygen level will be monitored often until the medicines you were given have worn off.  Do not drive for 24 hours. This information is not intended to replace advice given to you by your health care provider. Make sure you discuss any questions you have with your health care provider. Document Revised: 05/15/2017 Document Reviewed: 09/22/2015 [employer] Patient Education  2020 [employer]  

## 2019-07-13 NOTE — H&P (Signed)
Chief Complaint: L1 Fracture  Supervising Physician: Arne Cleveland  Patient Status: [facility] - Out-pt  History of Present Illness: Katherine [name] is a 86 y.o. female who fell back on 06/23/19.  CT scan showed 1. New mild compression fracture of the superior endplate of L1.  She has been taking Hydrocodone/APAP 5/325 with minimal relief.  Images were reviewed by Dr. Vernard Gambles and she is a good candidate for vertebral augmentation.  She is NPO. No nausea/vomiting. No Fever/chills. ROS negative.   Past Medical History:  Diagnosis Date  . Alcoholic ([place])    36 years sober  . Allergy   . Anemia   . Arthritis   . Asthma    patient denies  . B12 deficiency   . COPD (chronic obstructive pulmonary disease) ([place])   . Depression    not recent  . Diverticulosis   . Esophageal stricture   . Headache    cluster headaches occasionally  . Hepatic cyst 01/30/10  . Hiatal hernia 1985   patient unaware  . Hx of adenomatous colonic polyps 2006  . Hyperlipidemia   . IBS (irritable bowel syndrome)   . PONV (postoperative nausea and vomiting)    no nausea or vomitting after surgery 07/2014  . Positive PPD   . PUD (peptic ulcer disease)   . Shortness of breath dyspnea    SOB WITH EXERTION   (NOT NEW)    Past Surgical History:  Procedure Laterality Date  . ABDOMINAL HYSTERECTOMY    . APPENDECTOMY    . BARTHOLIN GLAND CYST EXCISION     x2  . BREAST BIOPSY Bilateral    Milk glnd  . CATARACT EXTRACTION W/ INTRAOCULAR LENS  IMPLANT, BILATERAL Bilateral   . COLONOSCOPY    . COLONOSCOPY W/ POLYPECTOMY    . HARDWARE REMOVAL N/A 12/13/2015   Procedure: Removal of HARDWARE  Lumbar Five-Sacral One ;  Surgeon: Kary Kos, MD;  Location: [place] NEURO ORS;  Service: Neurosurgery;  Laterality: N/A;  . UPPER GI ENDOSCOPY    . VAGOTOMY AND PYLOROPLASTY     in [place], [place]    Allergies: Patient has no known allergies.  Medications: Prior to Admission medications   Medication Sig  Start Date End Date Taking? Authorizing Provider  albuterol (PROVENTIL) (2.5 MG/3ML) 0.083% nebulizer solution 1 vial in neb every 4-6 hours as needed  Dx: J44.9 05/23/19  Yes Parrett, Tammy S, NP  albuterol (VENTOLIN HFA) 108 (90 Base) MCG/ACT inhaler INHALE 2 PUFFS INTO THE LUNGS 4 TIMES A DAY AS NEEDED 05/30/19  Yes Parrett, Tammy S, NP  ALPRAZolam (XANAX) 0.25 MG tablet Take 1 tablet (0.25 mg total) by mouth 2 (two) times daily as needed for anxiety. 04/25/19  Yes Baxley, Cresenciano Lick, MD  atorvastatin (LIPITOR) 10 MG tablet TAKE 1 TABLET (10 MG TOTAL) BY MOUTH DAILY. Patient taking differently: Take 10 mg by mouth daily.  04/20/18  Yes Baxley, Cresenciano Lick, MD  B Complex Vitamins (B-COMPLEX/B-12 TR PO) Take 1 tablet by mouth daily.   Yes [provider]  cholecalciferol (VITAMIN D) 1000 UNITS tablet Take 1,000 Units by mouth daily.   Yes [provider]  Fluticasone-Umeclidin-Vilant (TRELEGY ELLIPTA) 100-62.5-25 MCG/INH AEPB Inhale 1 puff into the lungs daily. 05/30/19  Yes Parrett, Tammy S, NP  Fluticasone-Umeclidin-Vilant (TRELEGY ELLIPTA) 100-62.5-25 MCG/INH AEPB Inhale 1 puff into the lungs daily. 05/30/19  Yes Parrett, Tammy S, NP  HYDROcodone-acetaminophen (NORCO/VICODIN) 5-325 MG tablet Take 1-2 tablets by mouth every 6 (six) hours as  needed. 06/27/19  Yes Baxley, Cresenciano Lick, MD  ipratropium (ATROVENT) 0.02 % nebulizer solution Take 2.5 mLs (0.5 mg total) by nebulization 4 (four) times daily. 02/17/19  Yes Baxley, Cresenciano Lick, MD  PATADAY 0.2 % SOLN Place 1 drop into both eyes daily as needed (for dry, allergy eyes).  10/10/15  Yes [provider]  promethazine (PHENERGAN) 12.5 MG tablet Take 1 tablet (12.5 mg total) by mouth every 8 (eight) hours as needed for nausea or vomiting. 08/03/18  Yes Baxley, Cresenciano Lick, MD  Suvorexant (BELSOMRA) 5 MG TABS Take 5 mg by mouth at bedtime. 02/15/19  Yes Elby Showers, MD  traZODone (DESYREL) 50 MG tablet TAKE 3 TABLETS BY MOUTH AT BEDTIME 05/30/19  Yes  Baxley, Cresenciano Lick, MD  umeclidinium-vilanterol (ANORO ELLIPTA) 62.5-25 MCG/INH AEPB TAKE 1 PUFF BY MOUTH EVERY DAY Patient taking differently: Inhale 1 puff into the lungs daily.  12/27/18  Yes Collene Gobble, MD  Vilazodone HCl (VIIBRYD) 40 MG TABS TAKE 1 TABLET (40 MG TOTAL) BY MOUTH DAILY. Patient taking differently: Take 40 mg by mouth daily. TAKE 1 TABLET (40 MG TOTAL) BY MOUTH DAILY. 08/16/18  Yes Baxley, Cresenciano Lick, MD  VITAMIN E PO Take 1 tablet by mouth daily.   Yes [provider]  SYMBICORT 160-4.5 MCG/ACT inhaler TAKE 2 PUFFS BY MOUTH TWICE A DAY 03/28/19   Elby Showers, MD     Family History  Problem Relation Age of Onset  . Ulcers Father   . Stroke Father   . Irritable bowel syndrome Father   . Ovarian cancer Sister   . Colon cancer Neg Hx     Social History   Socioeconomic History  . Marital status: Married    Spouse name: Not on file  . Number of children: 2  . Years of education: Not on file  . Highest education level: Not on file  Occupational History    Employer: RETIRED  Tobacco Use  . Smoking status: Former Smoker    Years: 60.00    Types: Cigarettes    Quit date: 02/02/2010    Years since quitting: 9.4  . Smokeless tobacco: Never Used  Substance and Sexual Activity  . Alcohol use: No    Comment: recovering alcoholic     35 YRS    . Drug use: No  . Sexual activity: Not on file  Other Topics Concern  . Not on file  Social History Narrative   Daily caffeine    Social Determinants of Health   Financial Resource Strain:   . Difficulty of Paying Living Expenses: Not on file  Food Insecurity:   . Worried About Charity fundraiser in the Last Year: Not on file  . Ran Out of Food in the Last Year: Not on file  Transportation Needs:   . Lack of Transportation (Medical): Not on file  . Lack of Transportation (Non-Medical): Not on file  Physical Activity:   . Days of Exercise per Week: Not on file  . Minutes of Exercise per Session: Not on file    Stress:   . Feeling of Stress : Not on file  Social Connections:   . Frequency of Communication with Friends and Family: Not on file  . Frequency of Social Gatherings with Friends and Family: Not on file  . Attends Religious Services: Not on file  . Active Member of Clubs or Organizations: Not on file  . Attends Archivist Meetings: Not on file  .  Marital Status: Not on file     Review of Systems: A 12 point ROS discussed and pertinent positives are indicated in the HPI above.  All other systems are negative.  Review of Systems  Vital Signs: BP 120/66   Pulse 88   Temp 97.6 F (36.4 C) (Oral)   Resp 16   Ht 5\' 1"  (1.549 m)   Wt 47.6 kg   SpO2 98%   BMI 19.84 kg/m   Physical Exam Vitals reviewed.  Constitutional:      Appearance: Normal appearance.  HENT:     Head: Normocephalic and atraumatic.  Eyes:     Extraocular Movements: Extraocular movements intact.  Cardiovascular:     Rate and Rhythm: Normal rate and regular rhythm.  Pulmonary:     Effort: Pulmonary effort is normal. No respiratory distress.     Breath sounds: Normal breath sounds.  Abdominal:     General: There is no distension.     Palpations: Abdomen is soft.     Tenderness: There is no abdominal tenderness.  Musculoskeletal:        General: Normal range of motion.       Back:     Comments: Pain and tenderness at L1  Skin:    General: Skin is warm and dry.  Neurological:     General: No focal deficit present.     Mental Status: She is alert and oriented to person, place, and time.  Psychiatric:        Mood and Affect: Mood normal.        Behavior: Behavior normal.        Thought Content: Thought content normal.        Judgment: Judgment normal.     Imaging: CT Head Wo Contrast  Result Date: 06/23/2019 CLINICAL DATA:  Fall, head trauma EXAM: CT HEAD WITHOUT CONTRAST TECHNIQUE: Contiguous axial images were obtained from the base of the skull through the vertex without intravenous  contrast. COMPARISON:  MRI 06/23/2007 FINDINGS: Brain: No evidence of acute infarction, hemorrhage, hydrocephalus, extra-axial collection or mass lesion/mass effect. Scattered low-density changes within the periventricular and subcortical white matter compatible with chronic microvascular ischemic change. Mild-moderate diffuse cerebral volume loss. Vascular: Mild atherosclerotic calcifications involving the large vessels of the skull base. No unexpected hyperdense vessel. Skull: Normal. Negative for fracture or focal lesion. Sinuses/Orbits: Mild mucosal thickening within the left maxillary sinus. Remaining paranasal sinuses and mastoid air cells are clear. Orbital structures are within normal limits. Other: None. IMPRESSION: 1. No acute intracranial abnormality. 2. Chronic microvascular ischemic changes and cerebral volume loss. Electronically Signed   By: Davina Poke D.O.   On: 06/23/2019 14:25   CT Chest W Contrast  Result Date: 06/23/2019 CLINICAL DATA:  Abdominal trauma and back pain secondary to a fall this morning at home. EXAM: CT CHEST, ABDOMEN, AND PELVIS WITH CONTRAST TECHNIQUE: Multidetector CT imaging of the chest, abdomen and pelvis was performed following the standard protocol during bolus administration of intravenous contrast. CONTRAST:  185mL OMNIPAQUE IOHEXOL 300 MG/ML  SOLN COMPARISON:  CT scan of the abdomen and pelvis dated 05/07/2015 and CT scan of the chest dated 02/07/2019 FINDINGS: CT CHEST FINDINGS Cardiovascular: Extensive aortic atherosclerosis. Coronary artery calcifications. Heart size is normal. No pericardial effusion. Mediastinum/Nodes: Multiple small thyroid nodules, unchanged. No hilar or mediastinal adenopathy. Trachea and esophagus appear normal. Multiple surgical clips at the gastroesophageal junction, likely from previous vagotomy. Lungs/Pleura: Extensive emphysema in the upper lobes. Stable 6 mm nodule in the  right lower lobe laterally. Slight scarring at the lung  bases posteriorly, stable. No effusions. Musculoskeletal: No chest wall mass or suspicious bone lesions identified. Old mild compression fracture of the superior endplate of T8. Old Schmorl's node in the superior endplate of QA348G. CT ABDOMEN PELVIS FINDINGS Hepatobiliary: Multiple small benign-appearing liver cysts. Liver parenchyma is otherwise normal. Biliary tree is normal. Pancreas: Unremarkable. No pancreatic ductal dilatation or surrounding inflammatory changes. Spleen: Normal in size without focal abnormality. Adrenals/Urinary Tract: Adrenal glands and kidneys demonstrate no significant abnormality. There are several tiny cysts in the left kidney. No hydronephrosis. Bladder appears normal. Stomach/Bowel: There are rare diverticula in the distal colon. Bowel otherwise appears normal. Appendectomy. Vascular/Lymphatic: Extensive aortic atherosclerosis. No adenopathy. Reproductive: Status post hysterectomy. No adnexal masses. Other: There is a tiny amount of free fluid in the pelvic cul-de-sac. No abdominal wall hernias. Musculoskeletal: There is a new mild compression fracture of the superior endplate of L1 which was not present on the prior chest CT dated 02/07/2019. Chronic degenerative changes in the lumbar spine with previous fusions from L3-L5. IMPRESSION: 1. New mild compression fracture of the superior endplate of L1 since [id number]. 2. No other acute abnormalities of the chest, abdomen, or pelvis. 3. Aortic Atherosclerosis (ICD10-I70.0) and Emphysema (ICD10-J43.9). Electronically Signed   By: Lorriane Shire M.D.   On: 06/23/2019 14:39   CT Cervical Spine Wo Contrast  Result Date: 06/23/2019 CLINICAL DATA:  Fall EXAM: CT CERVICAL SPINE WITHOUT CONTRAST TECHNIQUE: Multidetector CT imaging of the cervical spine was performed without intravenous contrast. Multiplanar CT image reconstructions were also generated. COMPARISON:  None. FINDINGS: Alignment: There is degenerative anterolisthesis at C2-C3 and  C3-C4 and retrolisthesis at C4-C5 and C5-C6. Skull base and vertebrae: No acute fracture. Vertebral body heights are maintained. Soft tissues and spinal canal: No prevertebral fluid or swelling. No visible canal hematoma. Disc levels: Multilevel degenerative changes are present including disc space narrowing, endplate osteophytes, and facet and uncovertebral hypertrophy. There is no high-grade osseous encroachment on the spinal canal. Multilevel neural foraminal stenosis is present. Upper chest: Advanced emphysema. Bilateral thyroid nodules measuring up to 1.1 cm; follow-up is not recommended per current guidelines. Other: None. IMPRESSION: No acute cervical spine fracture. Electronically Signed   By: Macy Mis M.D.   On: 06/23/2019 14:31   CT Abdomen Pelvis W Contrast  Result Date: 06/23/2019 CLINICAL DATA:  Abdominal trauma and back pain secondary to a fall this morning at home. EXAM: CT CHEST, ABDOMEN, AND PELVIS WITH CONTRAST TECHNIQUE: Multidetector CT imaging of the chest, abdomen and pelvis was performed following the standard protocol during bolus administration of intravenous contrast. CONTRAST:  153mL OMNIPAQUE IOHEXOL 300 MG/ML  SOLN COMPARISON:  CT scan of the abdomen and pelvis dated 05/07/2015 and CT scan of the chest dated 02/07/2019 FINDINGS: CT CHEST FINDINGS Cardiovascular: Extensive aortic atherosclerosis. Coronary artery calcifications. Heart size is normal. No pericardial effusion. Mediastinum/Nodes: Multiple small thyroid nodules, unchanged. No hilar or mediastinal adenopathy. Trachea and esophagus appear normal. Multiple surgical clips at the gastroesophageal junction, likely from previous vagotomy. Lungs/Pleura: Extensive emphysema in the upper lobes. Stable 6 mm nodule in the right lower lobe laterally. Slight scarring at the lung bases posteriorly, stable. No effusions. Musculoskeletal: No chest wall mass or suspicious bone lesions identified. Old mild compression fracture of the  superior endplate of T8. Old Schmorl's node in the superior endplate of QA348G. CT ABDOMEN PELVIS FINDINGS Hepatobiliary: Multiple small benign-appearing liver cysts. Liver parenchyma is otherwise normal. Biliary tree is normal. Pancreas:  Unremarkable. No pancreatic ductal dilatation or surrounding inflammatory changes. Spleen: Normal in size without focal abnormality. Adrenals/Urinary Tract: Adrenal glands and kidneys demonstrate no significant abnormality. There are several tiny cysts in the left kidney. No hydronephrosis. Bladder appears normal. Stomach/Bowel: There are rare diverticula in the distal colon. Bowel otherwise appears normal. Appendectomy. Vascular/Lymphatic: Extensive aortic atherosclerosis. No adenopathy. Reproductive: Status post hysterectomy. No adnexal masses. Other: There is a tiny amount of free fluid in the pelvic cul-de-sac. No abdominal wall hernias. Musculoskeletal: There is a new mild compression fracture of the superior endplate of L1 which was not present on the prior chest CT dated 02/07/2019. Chronic degenerative changes in the lumbar spine with previous fusions from L3-L5. IMPRESSION: 1. New mild compression fracture of the superior endplate of L1 since X33443. 2. No other acute abnormalities of the chest, abdomen, or pelvis. 3. Aortic Atherosclerosis (ICD10-I70.0) and Emphysema (ICD10-J43.9). Electronically Signed   By: Lorriane Shire M.D.   On: 06/23/2019 14:39    Labs:  CBC: Recent Labs    07/22/18 1124 02/07/19 1327 05/24/19 1537 06/23/19 1248  WBC 6.9 7.4 5.9 8.1  HGB 11.7 11.5* 11.1* 10.5*  HCT 36.0 37.3 34.2* 33.9*  PLT 225 240 267 228    COAGS: No results for input(s): INR, APTT in the last 8760 hours.  BMP: Recent Labs    07/22/18 1124 02/07/19 1327 05/24/19 1537 06/23/19 1248  NA 142 142 143 138  K 4.0 3.4* 4.1 3.6  CL 106 111 107 106  CO2 30 22 21 24   GLUCOSE 83 109* 171* 122*  BUN 18 24* 19 28*  CALCIUM 9.2 8.9 9.6 8.9  CREATININE 1.05*  1.00 1.01* 1.08*  GFRNONAA 47* 50* 50* 46*  GFRAA 55* 58* 58* 53*    LIVER FUNCTION TESTS: Recent Labs    07/22/18 1124 05/24/19 1537 06/23/19 1248  BILITOT 0.3 0.3 0.4  AST 31 21 26   ALT 19 10 15   ALKPHOS  --   --  57  PROT 5.9* 6.6  6.7 6.4*  ALBUMIN  --   --  3.6    TUMOR MARKERS: No results for input(s): AFPTM, CEA, CA199, CHROMGRNA in the last 8760 hours.  Assessment and Plan:  New mild compression fracture of the superior endplate of L1 after a fall.  Will proceed with image guided kyphoplasty/vertebral augmentation today by Dr. Vernard Gambles.  Risks and benefits of kyphoplasty were discussed with the patient including, but not limited to education regarding the natural healing process of compression fractures without intervention, bleeding, infection, cement migration which may cause spinal cord damage, paralysis, pulmonary embolism or even death.  This interventional procedure involves the use of X-rays and because of the nature of the planned procedure, it is possible that we will have prolonged use of X-ray fluoroscopy.  Potential radiation risks to you include (but are not limited to) the following: - A slightly elevated risk for cancer  several years later in life. This risk is typically less than 0.5% percent. This risk is low in comparison to the normal incidence of human cancer, which is 33% for women and 50% for men according to the [employer]. - Radiation induced injury can include skin redness, resembling a rash, tissue breakdown / ulcers and hair loss (which can be temporary or permanent).   The likelihood of either of these occurring depends on the difficulty of the procedure and whether you are sensitive to radiation due to previous procedures, disease, or genetic conditions.   IF  your procedure requires a prolonged use of radiation, you will be notified and given written instructions for further action.  It is your responsibility to monitor the  irradiated area for the 2 weeks following the procedure and to notify your physician if you are concerned that you have suffered a radiation induced injury.    All of the patient's questions were answered, patient is agreeable to proceed.  Consent signed and in chart.  Thank you for this interesting consult.  I greatly enjoyed meeting Shatonya Streeper Stolar and look forward to participating in their care.  A copy of this report was sent to the requesting provider on this date.  Electronically Signed: Murrell Redden, PA-C   07/13/2019, 8:43 AM      I spent a total of  30 Minutes in face to face in clinical consultation, greater than 50% of which was counseling/coordinating care for kyphoplasty

## 2019-07-13 NOTE — Procedures (Signed)
  Procedure: Lumbar L1 kyphoplasty   EBL:   minimal Complications:  none immediate  See full dictation in [facility].  Dillard Cannon MD Main # [phone] Pager  [phone]

## 2019-07-14 ENCOUNTER — Telehealth: Payer: Self-pay | Admitting: Internal Medicine

## 2019-07-14 NOTE — Telephone Encounter (Signed)
[name] [phone]  [name] called to say that Katherine [name] had her procedure yesterday and she is doing better. Even her walking is better, still some pain but not as much. He also said that they changed to [facility] first of the year and they are not going to cover her nausea medicine. So he is going to check with pharmacy and insurance company to see what they will cover.

## 2019-07-21 ENCOUNTER — Other Ambulatory Visit: Payer: Self-pay | Admitting: Internal Medicine

## 2019-07-21 DIAGNOSIS — S32010A Wedge compression fracture of first lumbar vertebra, initial encounter for closed fracture: Secondary | ICD-10-CM | POA: Diagnosis not present

## 2019-08-01 ENCOUNTER — Ambulatory Visit: Payer: Medicare Other | Admitting: Adult Health

## 2019-08-02 DIAGNOSIS — S32010A Wedge compression fracture of first lumbar vertebra, initial encounter for closed fracture: Secondary | ICD-10-CM | POA: Diagnosis not present

## 2019-08-04 ENCOUNTER — Other Ambulatory Visit: Payer: Medicare Other | Admitting: Internal Medicine

## 2019-08-05 ENCOUNTER — Encounter: Payer: Medicare Other | Admitting: Internal Medicine

## 2019-08-08 ENCOUNTER — Encounter: Payer: Self-pay | Admitting: Internal Medicine

## 2019-08-08 ENCOUNTER — Ambulatory Visit: Payer: Medicare PPO | Admitting: Internal Medicine

## 2019-08-08 ENCOUNTER — Ambulatory Visit: Payer: Medicare PPO | Admitting: Adult Health

## 2019-08-08 ENCOUNTER — Other Ambulatory Visit: Payer: Self-pay

## 2019-08-08 VITALS — BP 100/60 | HR 96 | Temp 98.0°F | Ht 61.5 in | Wt 86.0 lb

## 2019-08-08 DIAGNOSIS — R627 Adult failure to thrive: Secondary | ICD-10-CM

## 2019-08-08 DIAGNOSIS — J449 Chronic obstructive pulmonary disease, unspecified: Secondary | ICD-10-CM | POA: Diagnosis not present

## 2019-08-08 DIAGNOSIS — R634 Abnormal weight loss: Secondary | ICD-10-CM

## 2019-08-08 DIAGNOSIS — S32010A Wedge compression fracture of first lumbar vertebra, initial encounter for closed fracture: Secondary | ICD-10-CM | POA: Diagnosis not present

## 2019-08-08 DIAGNOSIS — R630 Anorexia: Secondary | ICD-10-CM

## 2019-08-08 MED ORDER — MEGESTROL ACETATE 40 MG PO TABS: ORAL_TABLET | ORAL | Status: DC

## 2019-08-08 MED ORDER — CYANOCOBALAMIN 1000 MCG/ML IJ SOLN
1000.0000 ug | Freq: Once | INTRAMUSCULAR | Status: AC
  Administered 2019-08-08: 1000 ug via INTRAMUSCULAR

## 2019-08-08 NOTE — Patient Instructions (Signed)
Take Phenergan for nausea.  Try to drink Ensure in between meals.  Increase fluid intake.  Start Megace for appetite stimulant.  Follow-up in 2 weeks.  Labs drawn and pending.

## 2019-08-08 NOTE — Progress Notes (Signed)
   Subjective:    Patient ID: Katherine [name], female    DOB: 1929/05/28, 86 y.o.   MRN: [id number]  HPI 86 year old Female suffered fall at home January 7th.  Suffered compression fracture of L1 with fall.  Is being seen by Dr. Saintclair Halsted.  Husband called January 28 to say patient had kyphoplasty at L1 by radiologist for pain relief.  By our records, she weighed 109 pounds on December 8.  At that time it was noted she had gained 4 pounds since July.  Today weighs 86 pounds.    Review of Systems see above     Objective:   Physical Exam Pulse oximetry is 94% on room air.  Blood pressure 100/60 temperature 98 degrees and pulse was 96 and regular.  She is conversant and pleasant sitting in wheelchair.  Says she is having a fair amount of pain.  Says her breathing is pretty good.  Continues to be followed by pulmonary for COPD.  Says she does not have any appetite and when she gets ready to eat feels nauseated frequently.  She does have Phenergan on hand and may take 1 or 2 of the 12.5 mg tablets recently prescribed.  Gave her coupons for Ensure.  Talked about eating several small meals.  Talked about what food preferences she might have but right now does not have any particular taste for anything.  Her chest is clear to auscultation.  Cardiac exam regular rate and rhythm 2/6 systolic ejection murmur.  No lower extremity edema.     Assessment & Plan:  Weight loss-by records 23 pounds since December 8.  Anorexia-does not have appetite and gets nauseated easily.  Has Phenergan to take for nausea.  Start on Megace 40 mg/cc for appetite stimulant.  Take 1 teaspoon 4 times a day.  Recommend Ensure as a supplement as well..  Follow-up March 9. Hemoglobin is stable at 11.9 g.  Likely has volume depletion with creatinine being 1.22 and previously 1.08 January 7.  Potassium is slightly low at 3.1.  Liver functions are normal.  TSH and free albumin are pending.  COPD followed by pulmonary  Status post  L1 fracture with kyphoplasty treatment  Likely has volume depletion with  elevated serum creatinine 1.22.  Encourage better fluid intake.

## 2019-08-08 NOTE — Addendum Note (Signed)
Addended by: Elby Showers on: 08/08/2019 10:34 PM   Modules accepted: Level of Service

## 2019-08-09 LAB — COMPLETE METABOLIC PANEL WITH GFR
AG Ratio: 1.4 (calc) (ref 1.0–2.5)
ALT: 7 U/L (ref 6–29)
AST: 18 U/L (ref 10–35)
Albumin: 3.7 g/dL (ref 3.6–5.1)
Alkaline phosphatase (APISO): 79 U/L (ref 37–153)
BUN/Creatinine Ratio: 13 (calc) (ref 6–22)
BUN: 16 mg/dL (ref 7–25)
CO2: 24 mmol/L (ref 20–32)
Calcium: 9.6 mg/dL (ref 8.6–10.4)
Chloride: 104 mmol/L (ref 98–110)
Creat: 1.22 mg/dL — ABNORMAL HIGH (ref 0.60–0.88)
GFR, Est African American: 45 mL/min/{1.73_m2} — ABNORMAL LOW (ref >=60)
GFR, Est Non African American: 39 mL/min/{1.73_m2} — ABNORMAL LOW (ref >=60)
Globulin: 2.7 g/dL (calc) (ref 1.9–3.7)
Glucose, Bld: 111 mg/dL — ABNORMAL HIGH (ref 65–99)
Potassium: 3.1 mmol/L — ABNORMAL LOW (ref 3.5–5.3)
Sodium: 141 mmol/L (ref 135–146)
Total Bilirubin: 0.4 mg/dL (ref 0.2–1.2)
Total Protein: 6.4 g/dL (ref 6.1–8.1)

## 2019-08-09 LAB — CBC WITH DIFFERENTIAL/PLATELET
Absolute Monocytes: 476 cells/uL (ref 200–950)
Basophils Absolute: 49 cells/uL (ref 0–200)
Basophils Relative: 0.6 %
Eosinophils Absolute: 213 cells/uL (ref 15–500)
Eosinophils Relative: 2.6 %
HCT: 38.2 % (ref 35.0–45.0)
Hemoglobin: 11.9 g/dL (ref 11.7–15.5)
Lymphs Abs: 1107 cells/uL (ref 850–3900)
MCH: 26.4 pg — ABNORMAL LOW (ref 27.0–33.0)
MCHC: 31.2 g/dL — ABNORMAL LOW (ref 32.0–36.0)
MCV: 84.7 fL (ref 80.0–100.0)
MPV: 11.7 fL (ref 7.5–12.5)
Monocytes Relative: 5.8 %
Neutro Abs: 6355 cells/uL (ref 1500–7800)
Neutrophils Relative %: 77.5 %
Platelets: 230 10*3/uL (ref 140–400)
RBC: 4.51 10*6/uL (ref 3.80–5.10)
RDW: 14.1 % (ref 11.0–15.0)
Total Lymphocyte: 13.5 %
WBC: 8.2 10*3/uL (ref 3.8–10.8)

## 2019-08-09 LAB — PREALBUMIN: Prealbumin: 15 mg/dL — ABNORMAL LOW (ref 17–34)

## 2019-08-09 LAB — TSH: TSH: 1.84 mIU/L (ref 0.40–4.50)

## 2019-08-11 ENCOUNTER — Other Ambulatory Visit: Payer: Self-pay

## 2019-08-11 ENCOUNTER — Ambulatory Visit: Payer: Medicare PPO | Admitting: Adult Health

## 2019-08-11 ENCOUNTER — Encounter: Payer: Self-pay | Admitting: Adult Health

## 2019-08-11 DIAGNOSIS — G8929 Other chronic pain: Secondary | ICD-10-CM

## 2019-08-11 DIAGNOSIS — J449 Chronic obstructive pulmonary disease, unspecified: Secondary | ICD-10-CM | POA: Diagnosis not present

## 2019-08-11 DIAGNOSIS — M545 Low back pain, unspecified: Secondary | ICD-10-CM

## 2019-08-11 DIAGNOSIS — R911 Solitary pulmonary nodule: Secondary | ICD-10-CM | POA: Diagnosis not present

## 2019-08-11 NOTE — Progress Notes (Signed)
@Patient  ID: Katherine [name], female    DOB: February 07, 1929, 86 y.o.   MRN: [id number]  Chief Complaint  Patient presents with  . Follow-up    COPD    Referring provider: Elby Showers, MD  HPI: 86 year old female former smoker followed for COPD and pulmonary nodule Medical history significant for positive PPD  TEST/EVENTS :  FEV1 0.94 (66) FVC 1.72 (89) Ratio 54% RV 131% TLC 106% DLCO/VA 40 % pred  CT chest August 2020 - for PE, severe emphysema, 6 mm right lower lobe lung nodule CT chest January 2021 no mediastinal or hilar adenopathy, extensive emphysema.  Stable 6 mm right lower lobe lung nodule bibasilar scarring stable, compression fracture L1  Overnight oximetry May 26, 2019 no significant desaturations nocturnally  08/11/2019 Follow up : COPD and pulmonary nodule Patient presents for a 16-month follow-up. She was recently changed from Anoro and Symbicort.  She was changed to Trelegy.  She says that it is working well for her.  She has no flare of cough or wheezing.  Overall says she has been doing well.  Walk test in the office last visit showed no exertional desaturations.  Patient was set up for an overnight oximetry done December 2020 that showed no nocturnal desaturations. Received Covid vaccine earlier this week.  She has chronic back pain . Takes pain meds. Advised on caution with narcotic use.  Husband Is care giver. They do not have any help at home. He has to do every thing.    No Known Allergies  Immunization History  Administered Date(s) Administered  . Influenza Split 02/20/2018  . Influenza Whole 01/29/2010  . Influenza, High Dose Seasonal PF 04/13/2014, 02/12/2016, 03/10/2017, 03/19/2018, 04/11/2019  . Influenza-Unspecified 03/16/2014  . PFIZER SARS-COV-2 Vaccination 08/08/2019  . Pneumococcal Conjugate-13 05/12/2016  . Pneumococcal Polysaccharide-23 08/08/1996  . Tdap 04/10/2003    Past Medical History:  Diagnosis Date  .  Alcoholic ([place])    36 years sober  . Allergy   . Anemia   . Arthritis   . Asthma    patient denies  . B12 deficiency   . COPD (chronic obstructive pulmonary disease) ([place])   . Depression    not recent  . Diverticulosis   . Esophageal stricture   . Headache    cluster headaches occasionally  . Hepatic cyst 01/30/10  . Hiatal hernia 1985   patient unaware  . Hx of adenomatous colonic polyps 2006  . Hyperlipidemia   . IBS (irritable bowel syndrome)   . PONV (postoperative nausea and vomiting)    no nausea or vomitting after surgery 07/2014  . Positive PPD   . PUD (peptic ulcer disease)   . Shortness of breath dyspnea    SOB WITH EXERTION   (NOT NEW)    Tobacco History: Social History   Tobacco Use  Smoking Status Former Smoker  . Years: 60.00  . Types: Cigarettes  . Quit date: 02/02/2010  . Years since quitting: 9.5  Smokeless Tobacco Never Used   Counseling given: Not Answered   Outpatient Medications Prior to Visit  Medication Sig Dispense Refill  . albuterol (PROVENTIL) (2.5 MG/3ML) 0.083% nebulizer solution 1 vial in neb every 4-6 hours as needed  Dx: J44.9 150 mL 5  . albuterol (VENTOLIN HFA) 108 (90 Base) MCG/ACT inhaler INHALE 2 PUFFS INTO THE LUNGS 4 TIMES A DAY AS NEEDED 18 g 11  . ALPRAZolam (XANAX) 0.25 MG tablet Take 1 tablet (0.25 mg total) by mouth 2 (two) times  daily as needed for anxiety. 30 tablet 5  . atorvastatin (LIPITOR) 10 MG tablet TAKE 1 TABLET (10 MG TOTAL) BY MOUTH DAILY. (Patient taking differently: Take 10 mg by mouth daily. ) 90 tablet 3  . B Complex Vitamins (B-COMPLEX/B-12 TR PO) Take 1 tablet by mouth daily.    . cholecalciferol (VITAMIN D) 1000 UNITS tablet Take 1,000 Units by mouth daily.    . Fluticasone-Umeclidin-Vilant (TRELEGY ELLIPTA) 100-62.5-25 MCG/INH AEPB Inhale 1 puff into the lungs daily. 30 each 5  . HYDROcodone-acetaminophen (NORCO/VICODIN) 5-325 MG tablet Take 1-2 tablets by mouth every 6 (six) hours as needed. 15 tablet 0   . ipratropium (ATROVENT) 0.02 % nebulizer solution Take 2.5 mLs (0.5 mg total) by nebulization 4 (four) times daily. 75 mL 12  . megestrol (MEGACE) 40 MG tablet 1 tbs four times daily for appetite stimulation.    Marland Kitchen PATADAY 0.2 % SOLN Place 1 drop into both eyes daily as needed (for dry, allergy eyes).   4  . promethazine (PHENERGAN) 12.5 MG tablet TAKE 1 TABLET (12.5 MG TOTAL) BY MOUTH EVERY 8 (EIGHT) HOURS AS NEEDED FOR NAUSEA OR VOMITING. 90 tablet 0  . Suvorexant (BELSOMRA) 5 MG TABS Take 5 mg by mouth at bedtime. 30 tablet 1  . traZODone (DESYREL) 50 MG tablet TAKE 3 TABLETS BY MOUTH AT BEDTIME 270 tablet 2  . VITAMIN E PO Take 1 tablet by mouth daily.    . Fluticasone-Umeclidin-Vilant (TRELEGY ELLIPTA) 100-62.5-25 MCG/INH AEPB Inhale 1 puff into the lungs daily. 14 each 0  . SYMBICORT 160-4.5 MCG/ACT inhaler TAKE 2 PUFFS BY MOUTH TWICE A DAY (Patient not taking: Reported on 08/11/2019) 30.6 Inhaler 1   No facility-administered medications prior to visit.     Review of Systems:   Constitutional:   No , night sweats,  Fevers, chills,  +fatigue, or  lassitude.  Low appetite  HEENT:   No headaches,  Difficulty swallowing,  Tooth/dental problems, or  Sore throat,                No sneezing, itching, ear ache, nasal congestion, post nasal drip,   CV:  No chest pain,  Orthopnea, PND, swelling in lower extremities, anasarca, dizziness, palpitations, syncope.   GI  No heartburn, indigestion, abdominal pain, nausea, vomiting, diarrhea, change in bowel habits, loss of appetite, bloody stools.   Resp:  .  No chest wall deformity  Skin: no rash or lesions.  GU: no dysuria, change in color of urine, no urgency or frequency.  No flank pain, no hematuria   MS: Chronic back pain    Physical Exam  BP 116/70 (BP Location: Right Arm, Cuff Size: Normal)   Pulse 84   Temp (!) 97.1 F (36.2 C) (Temporal)   Ht 5' 1.5" (1.562 m)   Wt 86 lb (39 kg)   SpO2 97% Comment: RA  BMI 15.99 kg/m    GEN: A/Ox3; pleasant , NAD, frail elderly in wheelchair   HEENT:  [place]/AT, , NOSE-clear, THROAT-clear, no lesions, no postnasal drip or exudate noted.   NECK:  Supple w/ fair ROM; no JVD; normal carotid impulses w/o bruits; no thyromegaly or nodules palpated; no lymphadenopathy.    RESP decreased breath sounds in the bases no accessory muscle use, no dullness to percussion  CARD:  RRR, no m/r/g, no peripheral edema, pulses intact, no cyanosis or clubbing.  GI:   Soft & nt; nml bowel sounds; no organomegaly or masses detected.   Musco: Warm bil, no deformities  or joint swelling noted.   Neuro: alert, no focal deficits noted.    Skin: Warm, no lesions or rashes    Lab Results:   BNP No results found for: BNP  ProBNP  PFT Results Latest Ref Rng & Units 06/22/2018  FVC-Pre L 1.72  FVC-Predicted Pre % 89  FVC-Post L 1.74  FVC-Predicted Post % 89  Pre FEV1/FVC % % 54  Post FEV1/FCV % % 56  FEV1-Pre L 0.94  FEV1-Predicted Pre % 66  FEV1-Post L 0.97  DLCO UNC% % 25  DLCO COR %Predicted % 40  TLC L 4.91  TLC % Predicted % 106  RV % Predicted % 131    No results found for: NITRICOXIDE      Assessment & Plan:   COPD (chronic obstructive pulmonary disease) Stable on current regimen.  Decrease symptom burden on Trelegy  Plan  Patient Instructions  Continue on Trelegy 1 puff daily, rinse after use  Activity as tolerated  CT chest in 1 year to follow lung nodule in right lower lung .  Referral to home health for evaluation of home assistance .  Follow-up in 4 months with Dr. Lamonte Sakai and as needed      Lung nodule Right lower lobe lung nodule 6 mm stable on CT chest August 2020 and January 2021.  Follow-up CT chest in 1 year without contrast  Chronic back pain Chronic back pain, physical deconditioning multiple comorbidities.  Discussion with patient and husband regarding caution with narcotic use.  Also needs assistance at home.  Referral to home health  made     Rexene Edison, NP 08/11/2019

## 2019-08-11 NOTE — Assessment & Plan Note (Signed)
Chronic back pain, physical deconditioning multiple comorbidities.  Discussion with patient and husband regarding caution with narcotic use.  Also needs assistance at home.  Referral to home health made

## 2019-08-11 NOTE — Patient Instructions (Addendum)
Continue on Trelegy 1 puff daily, rinse after use  Activity as tolerated  CT chest in 1 year to follow lung nodule in right lower lung .  Referral to home health for evaluation of home assistance .  Follow-up in 4 months with Dr. Lamonte Sakai and as needed

## 2019-08-11 NOTE — Addendum Note (Signed)
Addended by: Parke Poisson E on: 08/11/2019 03:07 PM   Modules accepted: Orders

## 2019-08-11 NOTE — Assessment & Plan Note (Signed)
Stable on current regimen.  Decrease symptom burden on Trelegy  Plan  Patient Instructions  Continue on Trelegy 1 puff daily, rinse after use  Activity as tolerated  CT chest in 1 year to follow lung nodule in right lower lung .  Referral to home health for evaluation of home assistance .  Follow-up in 4 months with Dr. Lamonte [name] and as needed

## 2019-08-11 NOTE — Assessment & Plan Note (Signed)
Right lower lobe lung nodule 6 mm stable on CT chest August 2020 and January 2021.  Follow-up CT chest in 1 year without contrast

## 2019-08-19 DIAGNOSIS — G8929 Other chronic pain: Secondary | ICD-10-CM | POA: Diagnosis not present

## 2019-08-19 DIAGNOSIS — M1990 Unspecified osteoarthritis, unspecified site: Secondary | ICD-10-CM | POA: Diagnosis not present

## 2019-08-19 DIAGNOSIS — D649 Anemia, unspecified: Secondary | ICD-10-CM | POA: Diagnosis not present

## 2019-08-19 DIAGNOSIS — S32010D Wedge compression fracture of first lumbar vertebra, subsequent encounter for fracture with routine healing: Secondary | ICD-10-CM | POA: Diagnosis not present

## 2019-08-19 DIAGNOSIS — K589 Irritable bowel syndrome without diarrhea: Secondary | ICD-10-CM | POA: Diagnosis not present

## 2019-08-19 DIAGNOSIS — S31010D Laceration without foreign body of lower back and pelvis without penetration into retroperitoneum, subsequent encounter: Secondary | ICD-10-CM | POA: Diagnosis not present

## 2019-08-19 DIAGNOSIS — I959 Hypotension, unspecified: Secondary | ICD-10-CM | POA: Diagnosis not present

## 2019-08-19 DIAGNOSIS — M545 Low back pain: Secondary | ICD-10-CM | POA: Diagnosis not present

## 2019-08-19 DIAGNOSIS — R911 Solitary pulmonary nodule: Secondary | ICD-10-CM | POA: Diagnosis not present

## 2019-08-23 ENCOUNTER — Encounter: Payer: Self-pay | Admitting: Internal Medicine

## 2019-08-23 ENCOUNTER — Other Ambulatory Visit: Payer: Self-pay

## 2019-08-23 ENCOUNTER — Ambulatory Visit: Payer: Medicare PPO | Admitting: Internal Medicine

## 2019-08-23 VITALS — BP 120/70 | HR 111 | Temp 98.0°F | Ht 62.0 in | Wt 99.0 lb

## 2019-08-23 DIAGNOSIS — S32010A Wedge compression fracture of first lumbar vertebra, initial encounter for closed fracture: Secondary | ICD-10-CM

## 2019-08-23 DIAGNOSIS — R7989 Other specified abnormal findings of blood chemistry: Secondary | ICD-10-CM

## 2019-08-23 DIAGNOSIS — R634 Abnormal weight loss: Secondary | ICD-10-CM

## 2019-08-23 NOTE — Patient Instructions (Signed)
Continue Megace an additional 2 weeks. RTC in 3 weeks at which time we will check B-met and prealbumin.

## 2019-08-23 NOTE — Progress Notes (Signed)
   Subjective:    Patient ID: Katherine [name], female    DOB: [birth year], 86 y.o.   MRN: [id number]  HPI 86 year old Female for follow up on weight loss and pain from compression fracture L1 after fall at home. Is being followed by Dr. Saintclair Halsted for compression fracture. Husband brought her today. They have had help from patient's granddaughter.  She weighed 86 pounds on Feb 22 and was started on Megace. 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   Says appetite has improved. Pain has improved some but this compression fracture has been an ordeal for her with pain and inability to do things about the house.    Review of Systems see above     Objective:   Physical Exam Weight is 99 pounds up from 86 pounds on Feb 22 and we will continue Megace an additional 2 weeks.She looks much more comfortable from a pain standpoint and more energetic.  Chest is clear to auscultation.  Cardiac exam regular rate and rhythm.  2/6 systolic ejection murmur unchanged.  Extremities without edema. Mentation and affect are within normal limits.      Assessment & Plan:  L1 compression fracture with severe pain-slowly improving  Weight loss-secondary to pain from L1 compression fracture.  Weight loss improved with Megace.  Continue an additional 2 weeks.  Prealbumin was 15 on February 22  COPD-no dyspnea today  Chronic back pain -status post 2 back surgeries.  Has been seen at [facility]  History of murmur consistent with moderate aortic stenosis with mild regurgitation-being monitored  Status post vagotomy and pyloroplasty  Plan: Return in 3 weeks at which time the maximum prealbumin will be checked

## 2019-08-26 ENCOUNTER — Emergency Department (HOSPITAL_COMMUNITY): Payer: Medicare PPO

## 2019-08-26 ENCOUNTER — Other Ambulatory Visit: Payer: Self-pay

## 2019-08-26 ENCOUNTER — Telehealth: Payer: Self-pay

## 2019-08-26 ENCOUNTER — Emergency Department (HOSPITAL_COMMUNITY)
Admission: EM | Admit: 2019-08-26 | Discharge: 2019-08-27 | Disposition: A | Discharge status: Home / Self Care | Payer: Medicare PPO | Attending: Emergency Medicine | Admitting: Emergency Medicine

## 2019-08-26 ENCOUNTER — Encounter (HOSPITAL_COMMUNITY): Payer: Self-pay

## 2019-08-26 DIAGNOSIS — Z79899 Other long term (current) drug therapy: Secondary | ICD-10-CM | POA: Diagnosis not present

## 2019-08-26 DIAGNOSIS — F0390 Unspecified dementia without behavioral disturbance: Secondary | ICD-10-CM | POA: Diagnosis not present

## 2019-08-26 DIAGNOSIS — Z87891 Personal history of nicotine dependence: Secondary | ICD-10-CM | POA: Diagnosis not present

## 2019-08-26 DIAGNOSIS — R4020 Unspecified coma: Secondary | ICD-10-CM | POA: Diagnosis not present

## 2019-08-26 DIAGNOSIS — Z20822 Contact with and (suspected) exposure to covid-19: Secondary | ICD-10-CM | POA: Diagnosis not present

## 2019-08-26 DIAGNOSIS — J439 Emphysema, unspecified: Secondary | ICD-10-CM | POA: Diagnosis not present

## 2019-08-26 DIAGNOSIS — J449 Chronic obstructive pulmonary disease, unspecified: Secondary | ICD-10-CM | POA: Diagnosis not present

## 2019-08-26 DIAGNOSIS — R55 Syncope and collapse: Secondary | ICD-10-CM | POA: Diagnosis not present

## 2019-08-26 DIAGNOSIS — R4182 Altered mental status, unspecified: Secondary | ICD-10-CM | POA: Diagnosis present

## 2019-08-26 DIAGNOSIS — R531 Weakness: Secondary | ICD-10-CM | POA: Diagnosis not present

## 2019-08-26 DIAGNOSIS — R410 Disorientation, unspecified: Secondary | ICD-10-CM | POA: Diagnosis not present

## 2019-08-26 LAB — COMPREHENSIVE METABOLIC PANEL
ALT: 15 U/L (ref 0–44)
AST: 22 U/L (ref 15–41)
Albumin: 3.4 g/dL — ABNORMAL LOW (ref 3.5–5.0)
Alkaline Phosphatase: 65 U/L (ref 38–126)
Anion gap: 9 (ref 5–15)
BUN: 15 mg/dL (ref 8–23)
CO2: 22 mmol/L (ref 22–32)
Calcium: 9.1 mg/dL (ref 8.9–10.3)
Chloride: 112 mmol/L — ABNORMAL HIGH (ref 98–111)
Creatinine, Ser: 1.1 mg/dL — ABNORMAL HIGH (ref 0.44–1.00)
GFR calc Af Amer: 52 mL/min — ABNORMAL LOW (ref >60)
GFR calc non Af Amer: 44 mL/min — ABNORMAL LOW (ref >60)
Glucose, Bld: 95 mg/dL (ref 70–99)
Potassium: 3.6 mmol/L (ref 3.5–5.1)
Sodium: 143 mmol/L (ref 135–145)
Total Bilirubin: 0.6 mg/dL (ref 0.3–1.2)
Total Protein: 6.4 g/dL — ABNORMAL LOW (ref 6.5–8.1)

## 2019-08-26 LAB — CBC WITH DIFFERENTIAL/PLATELET
Abs Immature Granulocytes: 0.05 10*3/uL (ref 0.00–0.07)
Basophils Absolute: 0.1 10*3/uL (ref 0.0–0.1)
Basophils Relative: 1 %
Eosinophils Absolute: 0.1 10*3/uL (ref 0.0–0.5)
Eosinophils Relative: 1 %
HCT: 40 % (ref 36.0–46.0)
Hemoglobin: 12.4 g/dL (ref 12.0–15.0)
Immature Granulocytes: 1 %
Lymphocytes Relative: 19 %
Lymphs Abs: 1.9 10*3/uL (ref 0.7–4.0)
MCH: 27.1 pg (ref 26.0–34.0)
MCHC: 31 g/dL (ref 30.0–36.0)
MCV: 87.5 fL (ref 80.0–100.0)
Monocytes Absolute: 0.6 10*3/uL (ref 0.1–1.0)
Monocytes Relative: 7 %
Neutro Abs: 7.1 10*3/uL (ref 1.7–7.7)
Neutrophils Relative %: 71 %
Platelets: 260 10*3/uL (ref 150–400)
RBC: 4.57 MIL/uL (ref 3.87–5.11)
RDW: 15 % (ref 11.5–15.5)
WBC: 9.8 10*3/uL (ref 4.0–10.5)
nRBC: 0 % (ref 0.0–0.2)

## 2019-08-26 LAB — URINALYSIS, ROUTINE W REFLEX MICROSCOPIC
Bilirubin Urine: NEGATIVE
Glucose, UA: NEGATIVE mg/dL
Hgb urine dipstick: NEGATIVE
Ketones, ur: NEGATIVE mg/dL
Leukocytes,Ua: NEGATIVE
Nitrite: NEGATIVE
Protein, ur: NEGATIVE mg/dL
Specific Gravity, Urine: 1.009 (ref 1.005–1.030)
pH: 6 (ref 5.0–8.0)

## 2019-08-26 LAB — POC SARS CORONAVIRUS 2 AG -  ED: SARS Coronavirus 2 Ag: NEGATIVE

## 2019-08-26 LAB — SARS CORONAVIRUS 2 (TAT 6-24 HRS): SARS Coronavirus 2: NEGATIVE

## 2019-08-26 LAB — TROPONIN I (HIGH SENSITIVITY)
Troponin I (High Sensitivity): 7 ng/L (ref <18)
Troponin I (High Sensitivity): 8 ng/L (ref <18)

## 2019-08-26 MED ORDER — SODIUM CHLORIDE 0.9 % IV BOLUS
1000.0000 mL | Freq: Once | INTRAVENOUS | Status: AC
  Administered 2019-08-26: 1000 mL via INTRAVENOUS

## 2019-08-26 MED ORDER — ALBUTEROL SULFATE (2.5 MG/3ML) 0.083% IN NEBU: 2.5000 mg | INHALATION_SOLUTION | RESPIRATORY_TRACT | Status: DC | PRN

## 2019-08-26 MED ORDER — ALBUTEROL SULFATE HFA 108 (90 BASE) MCG/ACT IN AERS: 2.0000 | INHALATION_SPRAY | Freq: Four times a day (QID) | RESPIRATORY_TRACT | Status: DC | PRN

## 2019-08-26 MED ORDER — PROMETHAZINE HCL 25 MG PO TABS: 12.5000 mg | ORAL_TABLET | Freq: Three times a day (TID) | ORAL | Status: DC | PRN

## 2019-08-26 MED ORDER — TRAMADOL HCL 50 MG PO TABS
50.0000 mg | ORAL_TABLET | Freq: Four times a day (QID) | ORAL | Status: DC | PRN
  Administered 2019-08-26: 50 mg via ORAL
  Filled 2019-08-26: qty 1

## 2019-08-26 MED ORDER — TRAZODONE HCL 50 MG PO TABS
150.0000 mg | ORAL_TABLET | Freq: Every day | ORAL | Status: DC
  Administered 2019-08-26: 150 mg via ORAL
  Filled 2019-08-26: qty 1

## 2019-08-26 MED ORDER — MEGESTROL ACETATE 400 MG/10ML PO SUSP
400.0000 mg | Freq: Two times a day (BID) | ORAL | Status: DC
  Administered 2019-08-26 – 2019-08-27 (×2): 400 mg via ORAL
  Filled 2019-08-26 (×3): qty 10

## 2019-08-26 MED ORDER — FLUTICASONE-UMECLIDIN-VILANT 100-62.5-25 MCG/INH IN AEPB: 1.0000 | INHALATION_SPRAY | Freq: Every day | RESPIRATORY_TRACT | Status: DC

## 2019-08-26 MED ORDER — BUDESONIDE 0.25 MG/2ML IN SUSP
0.2500 mg | Freq: Two times a day (BID) | RESPIRATORY_TRACT | Status: DC
  Filled 2019-08-26: qty 2

## 2019-08-26 MED ORDER — ALPRAZOLAM 0.25 MG PO TABS: 0.2500 mg | ORAL_TABLET | Freq: Two times a day (BID) | ORAL | Status: DC | PRN

## 2019-08-26 MED ORDER — UMECLIDINIUM-VILANTEROL 62.5-25 MCG/INH IN AEPB
1.0000 | INHALATION_SPRAY | Freq: Every day | RESPIRATORY_TRACT | Status: DC
  Filled 2019-08-26: qty 14

## 2019-08-26 MED ORDER — SODIUM CHLORIDE 0.9 % IV BOLUS: 1000.0000 mL | Freq: Once | INTRAVENOUS | Status: DC

## 2019-08-26 NOTE — Progress Notes (Signed)
CSW confirmed COVID test and PT recommendation was initiated and received.  CSW sent referrals out to all [place] area SNF's including [facility]'s and [name] (pt's husband's preferences).  Pt's husband is requesting a private room for the pt.  CSW will continue to follow for D/C needs.  Alphonse Guild. Kanda Deluna, LCSW, LCAS, CSI Transitions of Care Clinical Social Worker Care Coordination Department Ph: [phone]

## 2019-08-26 NOTE — ED Triage Notes (Signed)
Pt presents with family with c/o AMS. Per husband, pt has been way less responsive than normal, not able to answer his questions and this is a baseline for pt. No hx of dementia per family. Pt's husband also reports that her HR was low today when he checked it at home. Pt moans in triage and when this RN asks questions, she simply stares and moans.

## 2019-08-26 NOTE — Evaluation (Signed)
Physical Therapy Evaluation Patient Details Name: Katherine [name] MRN: [id number] DOB: Nov 13, 1928 Today's Date: 08/26/2019   History of Present Illness  86 y.o. female hx of COPD, PUD, advanced dementia, and lumbar surgery in Jan 2021 and presented to ED with altered mental status, decreased appetite.  Clinical Impression  Pt seen in ED. Pt currently with functional limitations due to the deficits listed below (see PT Problem List). Pt will benefit from skilled PT to increase their independence and safety with mobility to allow discharge to the venue listed below.  Pt requiring mod assist for bed mobility at this time and too weak to attempt standing.  Spouse present and reports pt has required increased assist lately, and he is no longer able to safely assist her at home.  Spouse agreeable with SNF for rehab.  Pt sleeping on arrival however did easily awaken and interact with therapist.  Recommend SNF upon d/c as pt requiring increased assist and presents as high fall risk.     Follow Up Recommendations SNF    Equipment Recommendations  None recommended by PT    Recommendations for Other Services       Precautions / Restrictions Precautions Precautions: Fall      Mobility  Bed Mobility Overal bed mobility: Needs Assistance Bed Mobility: Sidelying to Sit;Sit to Sidelying   Sidelying to sit: Mod assist     Sit to sidelying: Mod assist General bed mobility comments: pt lying on side on arrival; assist required for trunk upright and LEs onto bed, cues for technique  Transfers                 General transfer comment: pt felt unable to stand, too weak  Ambulation/Gait                Stairs            Wheelchair Mobility    Modified Rankin (Stroke Patients Only)       Balance Overall balance assessment: Needs assistance;History of Falls Sitting-balance support: Bilateral upper extremity supported;Feet unsupported Sitting balance-Leahy Scale:  Zero Sitting balance - Comments: pt unable to maintain upright trunk posture in static sitting, posterior lean observed Postural control: Posterior lean                                   Pertinent Vitals/Pain Pain Assessment: Faces Faces Pain Scale: Hurts little more Pain Location: back Pain Descriptors / Indicators: Discomfort;Grimacing Pain Intervention(s): Monitored during session;Repositioned(spouse reports chronic back pain)    Home Living Family/patient expects to be discharged to:: Private residence Living Arrangements: Spouse/significant other Available Help at Discharge: Family Type of Home: House Home Access: Level entry     Home Layout: Two level Home Equipment: Environmental consultant - 2 wheels;Wheelchair - manual      Prior Function Level of Independence: Needs assistance   Gait / Transfers Assistance Needed: ambulating short distances with assist and RW; spouse reports recent decline in mobility and increased need for assist           Hand Dominance        Extremity/Trunk Assessment   Upper Extremity Assessment Upper Extremity Assessment: Generalized weakness    Lower Extremity Assessment Lower Extremity Assessment: Generalized weakness(spots of ecchymosis on LEs however pt unable to recall how they occurred)       Communication   Communication: HOH  Cognition Arousal/Alertness: Awake/alert Behavior During Therapy: Flat affect Overall Cognitive  Status: History of cognitive impairments - at baseline                                 General Comments: hx dementia, oriented to person and year; pt conversing with PT however would stare at ceiling occasionally, attempting to answer basic questions      General Comments      Exercises     Assessment/Plan    PT Assessment Patient needs continued PT services  PT Problem List Decreased strength;Decreased mobility;Decreased activity tolerance;Decreased balance;Decreased knowledge of  use of DME;Decreased cognition       PT Treatment Interventions DME instruction;Therapeutic activities;Stair training;Functional mobility training;Gait training;Therapeutic exercise;Balance training;Patient/family education    PT Goals (Current goals can be found in the Care Plan section)  Acute Rehab PT Goals PT Goal Formulation: With patient Time For Goal Achievement: 09/09/19 Potential to Achieve Goals: Good    Frequency Min 2X/week   Barriers to discharge        Co-evaluation               AM-PAC PT "6 Clicks" Mobility  Outcome Measure Help needed turning from your back to your side while in a flat bed without using bedrails?: A Little Help needed moving from lying on your back to sitting on the side of a flat bed without using bedrails?: A Lot Help needed moving to and from a bed to a chair (including a wheelchair)?: A Lot Help needed standing up from a chair using your arms (e.g., wheelchair or bedside chair)?: A Lot Help needed to walk in hospital room?: A Lot Help needed climbing 3-5 steps with a railing? : Total 6 Click Score: 12    End of Session   Activity Tolerance: Patient tolerated treatment well Patient left: in bed;with call bell/phone within reach;with family/visitor present Nurse Communication: Mobility status PT Visit Diagnosis: Other abnormalities of gait and mobility (R26.89)    Time: [id number] PT Time Calculation (min) (ACUTE ONLY): 15 min   Charges:   PT Evaluation $PT Eval Low Complexity: 1 Low     Kati PT, DPT Acute Rehabilitation Services Office: [phone]   Trena Platt 08/26/2019, 4:08 PM

## 2019-08-26 NOTE — ED Provider Notes (Signed)
[facility] DEPT Provider Note   CSN: [id number] Arrival date & time: 08/26/19  1004     History Chief Complaint  Patient presents with  . Altered Mental Status    Katherine [name] is a 86 y.o. female hx of COPD, PUD, advanced dementia here presenting with altered mental status, decreased appetite.  Patient has been having decreased appetite for the last several weeks.  Patient recently saw her doctor and was prescribed medicine to increase her appetite.  Patient lives at home with her husband.  Per her husband, she seemed very confused today when she woke up.  She also was sitting on the commode and felt very weak and fell over.  Denies any head injury or loss of consciousness.  Patient is also speaking less normal.  Patient unable to answer any questions.  The history is provided by a relative.  Level V caveat- dementia      Past Medical History:  Diagnosis Date  . Alcoholic ([place])    36 years sober  . Allergy   . Anemia   . Arthritis   . Asthma    patient denies  . B12 deficiency   . COPD (chronic obstructive pulmonary disease) ([place])   . Depression    not recent  . Diverticulosis   . Esophageal stricture   . Headache    cluster headaches occasionally  . Hepatic cyst 01/30/10  . Hiatal hernia 1985   patient unaware  . Hx of adenomatous colonic polyps 2006  . Hyperlipidemia   . IBS (irritable bowel syndrome)   . PONV (postoperative nausea and vomiting)    no nausea or vomitting after surgery 07/2014  . Positive PPD   . PUD (peptic ulcer disease)   . Shortness of breath dyspnea    SOB WITH EXERTION   (NOT NEW)    Patient Active Problem List   Diagnosis Date Noted  . Lung nodule 08/11/2019  . Aortic stenosis 07/16/2017  . Painful orthopaedic hardware ([place]) 12/13/2015  . Closed Colles' fracture of left radius 06/14/2015  . Spinal stenosis of lumbar region 03/21/2015  . Spondylolisthesis at L4-L5 level 08/02/2014  . History of  non anemic vitamin B12 deficiency 01/02/2013  . History 01/02/2013  . History of pyloroplasty 01/02/2013  . History of Helicobacter pylori infection 01/02/2013  . History of depression 01/02/2013  . Recovering alcoholic in remission ([place]) 01/02/2013  . History of positive PPD 01/02/2013  . Anemia, iron deficiency 11/15/2012  . COPD (chronic obstructive pulmonary disease) ([place]) 07/06/2012  . Hyperlipidemia 12/24/2010  . GE reflux 12/24/2010  . Chronic back pain 12/24/2010    Past Surgical History:  Procedure Laterality Date  . ABDOMINAL HYSTERECTOMY    . APPENDECTOMY    . BARTHOLIN GLAND CYST EXCISION     x2  . BREAST BIOPSY Bilateral    Milk glnd  . CATARACT EXTRACTION W/ INTRAOCULAR LENS  IMPLANT, BILATERAL Bilateral   . COLONOSCOPY    . COLONOSCOPY W/ POLYPECTOMY    . HARDWARE REMOVAL N/A 12/13/2015   Procedure: Removal of HARDWARE  Lumbar Five-Sacral One ;  Surgeon: Kary Kos, MD;  Location: [facility];  Service: Neurosurgery;  Laterality: N/A;  . IR KYPHO LUMBAR INC FX REDUCE BONE BX UNI/BIL CANNULATION INC/IMAGING  07/13/2019  . UPPER GI ENDOSCOPY    . VAGOTOMY AND PYLOROPLASTY     in [place]     OB History   No obstetric history on file.     Family  History  Problem Relation Age of Onset  . Ulcers Father   . Stroke Father   . Irritable bowel syndrome Father   . Ovarian cancer Sister   . Colon cancer Neg Hx     Social History   Tobacco Use  . Smoking status: Former Smoker    Years: 60.00    Types: Cigarettes    Quit date: 02/02/2010    Years since quitting: 9.5  . Smokeless tobacco: Never Used  Substance Use Topics  . Alcohol use: No    Comment: recovering alcoholic     35 YRS    . Drug use: No    Home Medications Prior to Admission medications   Medication Sig Start Date End Date Taking? Authorizing Provider  albuterol (PROVENTIL) (2.5 MG/3ML) 0.083% nebulizer solution 1 vial in neb every 4-6 hours as needed  Dx: J44.9 Patient taking  differently: Take 2.5 mg by nebulization every 4 (four) hours as needed for wheezing or shortness of breath. 1 vial in neb every 4-6 hours as needed  Dx: J44.9 05/23/19  Yes Parrett, Tammy S, NP  albuterol (VENTOLIN HFA) 108 (90 Base) MCG/ACT inhaler INHALE 2 PUFFS INTO THE LUNGS 4 TIMES A DAY AS NEEDED Patient taking differently: Inhale 2 puffs into the lungs 4 (four) times daily as needed for wheezing or shortness of breath. INHALE 2 PUFFS INTO THE LUNGS 4 TIMES A DAY AS NEEDED 05/30/19  Yes Parrett, Tammy S, NP  ALPRAZolam (XANAX) 0.25 MG tablet Take 1 tablet (0.25 mg total) by mouth 2 (two) times daily as needed for anxiety. 04/25/19  Yes Baxley, Cresenciano Lick, MD  Fluticasone-Umeclidin-Vilant (TRELEGY ELLIPTA) 100-62.5-25 MCG/INH AEPB Inhale 1 puff into the lungs daily. 05/30/19  Yes Parrett, Tammy S, NP  megestrol (MEGACE) 40 MG/ML suspension Take 400 mg by mouth 2 (two) times daily. 08/24/19  Yes [provider]  promethazine (PHENERGAN) 12.5 MG tablet TAKE 1 TABLET (12.5 MG TOTAL) BY MOUTH EVERY 8 (EIGHT) HOURS AS NEEDED FOR NAUSEA OR VOMITING. 07/21/19  Yes Baxley, Cresenciano Lick, MD  traMADol (ULTRAM) 50 MG tablet Take 50 mg by mouth every 6 (six) hours as needed for moderate pain.  07/18/19  Yes [provider]  traZODone (DESYREL) 50 MG tablet TAKE 3 TABLETS BY MOUTH AT BEDTIME Patient taking differently: Take 150 mg by mouth at bedtime as needed for sleep.  05/30/19  Yes Baxley, Cresenciano Lick, MD  atorvastatin (LIPITOR) 10 MG tablet TAKE 1 TABLET (10 MG TOTAL) BY MOUTH DAILY. Patient not taking: Reported on 08/23/2019 04/20/18   Elby Showers, MD  HYDROcodone-acetaminophen (NORCO/VICODIN) 5-325 MG tablet Take 1-2 tablets by mouth every 6 (six) hours as needed. Patient not taking: Reported on 08/26/2019 06/27/19   Elby Showers, MD  ipratropium (ATROVENT) 0.02 % nebulizer solution Take 2.5 mLs (0.5 mg total) by nebulization 4 (four) times daily. Patient not taking: Reported on 08/26/2019 02/17/19   Elby Showers, MD  megestrol (MEGACE) 40 MG tablet 1 tbs four times daily for appetite stimulation. Patient not taking: Reported on 08/26/2019 08/08/19   Elby Showers, MD    Allergies    Patient has no known allergies.  Review of Systems   Review of Systems  Neurological: Positive for weakness.  All other systems reviewed and are negative.   Physical Exam Updated Vital Signs BP (!) 138/40   Pulse 73   Temp 97.7 F (36.5 C) (Oral)   Resp (!) 24   SpO2 100%   Physical  Exam Vitals and nursing note reviewed.  Constitutional:      Comments: Confused   HENT:     Head: Normocephalic.     Nose: Nose normal.     Mouth/Throat:     Mouth: Mucous membranes are moist.  Eyes:     Extraocular Movements: Extraocular movements intact.     Pupils: Pupils are equal, round, and reactive to light.  Cardiovascular:     Rate and Rhythm: Normal rate and regular rhythm.     Pulses: Normal pulses.     Heart sounds: Normal heart sounds.  Pulmonary:     Effort: Pulmonary effort is normal.     Breath sounds: Normal breath sounds.  Abdominal:     General: Abdomen is flat.     Palpations: Abdomen is soft.  Musculoskeletal:        General: Normal range of motion.     Cervical back: Normal range of motion.     Comments: No obvious extremity trauma or bruising   Skin:    General: Skin is warm.     Capillary Refill: Capillary refill takes less than 2 seconds.  Neurological:     Comments: Confused, unable to answer questions. Moving all extremities, strength 4/5 throughout   Psychiatric:     Comments: Unable      ED Results / Procedures / Treatments   Labs (all labs ordered are listed, but only abnormal results are displayed) Labs Reviewed  COMPREHENSIVE METABOLIC PANEL - Abnormal; Notable for the following components:      Result Value   Chloride 112 (*)    Creatinine, Ser 1.10 (*)    Total Protein 6.4 (*)    Albumin 3.4 (*)    GFR calc non Af Amer 44 (*)    GFR calc Af Amer 52 (*)     All other components within normal limits  URINE CULTURE  SARS CORONAVIRUS 2 (TAT 6-24 HRS)  CBC WITH DIFFERENTIAL/PLATELET  URINALYSIS, ROUTINE W REFLEX MICROSCOPIC  POC SARS CORONAVIRUS 2 AG -  ED  TROPONIN I (HIGH SENSITIVITY)  TROPONIN I (HIGH SENSITIVITY)    EKG None  Radiology CT Head Wo Contrast  Result Date: 08/26/2019 CLINICAL DATA:  Patient unresponsive this morning. EXAM: CT HEAD WITHOUT CONTRAST TECHNIQUE: Contiguous axial images were obtained from the base of the skull through the vertex without intravenous contrast. COMPARISON:  Head CT 06/23/2019. FINDINGS: Brain: No evidence of acute infarction, hemorrhage, hydrocephalus, extra-axial collection or mass lesion/mass effect. Atrophy and chronic microvascular ischemic change again seen. Vascular: Atherosclerosis.  No hyperdense vessel. Skull: Intact.  No focal lesion. Sinuses/Orbits: Mild mucosal thickening left maxillary sinus has increased since the prior examination. The walls of the left maxillary sinus are thickened and sclerotic consistent with chronic change. The patient is status post maxillary antrostomy. Other: None. IMPRESSION: No acute abnormality. Atrophy and chronic microvascular ischemic change. Atherosclerosis. Electronically Signed   By: Inge Rise M.D.   On: 08/26/2019 12:31   MR BRAIN WO CONTRAST  Result Date: 08/26/2019 CLINICAL DATA:  Sudden onset of confusion and encephalopathy today. EXAM: MRI HEAD WITHOUT CONTRAST TECHNIQUE: Multiplanar, multiecho pulse sequences of the brain and surrounding structures were obtained without intravenous contrast. COMPARISON:  Head CT earlier same day. FINDINGS: Brain: Diffusion imaging does not show any acute or subacute infarction or other cause of restricted diffusion. Generalized age related atrophy. Mild chronic small-vessel change of the pons. No focal cerebellar insult. Cerebral hemispheres show mild chronic small-vessel change of the hemispheric white matter  considering age. No cortical or large vessel territory infarction. No mass lesion, hemorrhage, hydrocephalus or extra-axial collection. Vascular: Major vessels at the base of the brain show flow. Skull and upper cervical spine: Negative Sinuses/Orbits: Mucosal inflammatory changes of the left maxillary sinus. Other sinuses are clear. Orbits negative. Previous lens implants. Other: None IMPRESSION: No acute intracranial finding. Age related atrophy. Mild chronic small-vessel change of the hemispheric white matter and pons considering age. Electronically Signed   By: Nelson Chimes M.D.   On: 08/26/2019 13:58   DG Chest Port 1 View  Result Date: 08/26/2019 CLINICAL DATA:  Altered mental status EXAM: PORTABLE CHEST 1 VIEW COMPARISON:  February 07, 2019 chest radiograph; CT chest June 23, 2019 FINDINGS: There is underlying emphysematous change, better delineated prior CT. There is no edema or airspace opacity. Heart size is upper normal. Diminished vascularity is consistent with the underlying emphysematous change, stable in appearance. There is aortic atherosclerosis. No adenopathy. No bone lesions. Surgical clips are noted at the gastroesophageal junction. IMPRESSION: Underlying emphysematous change. No edema or airspace opacity. Heart upper normal in size. Aortic atherosclerosis. Aortic Atherosclerosis (ICD10-I70.0) and Emphysema (ICD10-J43.9). Electronically Signed   By: Lowella Grip III M.D.   On: 08/26/2019 11:11    Procedures Procedures (including critical care time)  Medications Ordered in ED Medications  ALPRAZolam (XANAX) tablet 0.25 mg (has no administration in time range)  promethazine (PHENERGAN) tablet 12.5 mg (has no administration in time range)  traMADol (ULTRAM) tablet 50 mg (50 mg Oral Given 08/26/19 1553)  traZODone (DESYREL) tablet 150 mg (has no administration in time range)  sodium chloride 0.9 % bolus 1,000 mL (0 mLs Intravenous Stopped 08/26/19 1312)    ED Course  I have  reviewed the triage vital signs and the nursing notes.  Pertinent labs & imaging results that were available during my care of the patient were reviewed by me and considered in my medical decision making (see chart for details).    MDM Rules/Calculators/A&P                      Rekeisha Dockett Mauch is a 86 y.o. female here presenting with altered mental status.  Patient very confused and unable to answer any questions.  Patient is from home and lives with her husband.  History is per husband.  Patient has slowed decreased mental status over the last several weeks and poor appetite now.  Patient looks clinically dehydrated.  Will check electrolytes and urinalysis and chest x-ray and CT head.    11 AM Patient's labs were unremarkable.  I talked to case management regarding placement.  They recommend physical therapy evaluation for possible rehab.  4 pm Physical therapy saw patient and patient can only sit up and unable to walk.  Case management is aware and will start the process for inpatient rehab.  Patient's MRI brain and CT head and urinalysis are unremarkable.  At this point, patient does not meet criteria for medical admission.   Final Clinical Impression(s) / ED Diagnoses Final diagnoses:  None    Rx / DC Orders ED Discharge Orders    None       Drenda Freeze, MD 08/26/19 1642

## 2019-08-26 NOTE — Progress Notes (Signed)
2nd shift ED CSW received a handoff from the 1st shift [facility] ED CSW.   Per the [place] pt's husband had preferences for SNF rehab for the pt.  CSW immediately sent initial referrals to:  [name]:  Left HIPPA-compliant VM requesting a call back  [facility]:  Per [facility], there are no available beds due to renovations.  [facility]:  Left HIPPA-compliant VM with Janie in admissions.  CSW will continue to follow for D/C needs.  Katherine [name]. Katherine Gratz, LCSW, LCAS, CSI Transitions of Care Clinical Social Worker Care Coordination Department Ph: [phone]     ]

## 2019-08-26 NOTE — Progress Notes (Signed)
[place] Must PASSR# [id number]  [place], LCSW Transitions of [facility] ED [phone]

## 2019-08-26 NOTE — Telephone Encounter (Signed)
I am glad she was taken to ED. Sounds like some acute event.

## 2019-08-26 NOTE — Telephone Encounter (Signed)
Patient's husband called patient woke up this morning and she doesn't know where or who she is, he said she would just sit and stare around. Her temperature was 97.9, her 02 was 95% then he called me back and her O2 was 69 and her heart rate was 30 I told him to take her to [facility] he agreed their granddaughter was at their house helping him take care of Katherine [name].

## 2019-08-26 NOTE — TOC Initial Note (Signed)
Transition of Care [facility]) - Initial/Assessment Note    Patient Details  Name: Katherine [name] MRN: [id number] Date of Birth: July 24, 1928  [facility]) CM/SW Contact:    Erenest Rasher, RN Phone Number: [phone] 08/26/2019, 2:29 PM  Clinical Narrative:   Referral for SNF placement               TOC CM spoke to husband, Daijah Yankee [id number]. States pt was up walking with RW and recently declined. States he wants SNF rehab at [facility] with private room. Explained waiting PT evaluation. Husband gave permission to create [facility] and fax referral to SNF in [place]. He prefers a SNF rehab close to him. Explained TOC CM/CSW will reach out to [facility] as his first choice and provide him information on other facilities that accepts referral. States he also familiar with [name] in [place]. Pt has a [place] that may assist him with getting her placed after her rehab stay to ALF/Memory Care. He was looking at [facility]. Plans to research as apart of his long term plan.   Expected Discharge Plan: Skilled Nursing Facility Barriers to Discharge: Continued Medical Work up   Patient Goals and CMS Choice Patient states their goals for this hospitalization and ongoing recovery are:: wife will need some rehab CMS Medicare.gov Compare Post Acute Care list provided to:: Patient Represenative (must comment)([name] Ong-husband) Choice offered to / list presented to : Spouse  Expected Discharge Plan and Services Expected Discharge Plan: [place] In-house Referral: Clinical Social Work Discharge Planning Services: CM Consult Post Acute Care Choice: [place] Living arrangements for the past 2 months: [place] Arranged: RN, PT, OT [name] Agency: [facility] (formerly [employer]) Date [place]: 08/26/19 Time [facility]: 86 Representative spoke with at [place]: Burlene Arnt  Prior Living Arrangements/Services Living arrangements for the past 2 months: [place] with:: Spouse Patient language and need for interpreter reviewed:: Yes Do you feel safe going back to the place where you live?: No   unable to walk  Need for Family Participation in Patient Care: Yes (Comment) Care giver support system in place?: Yes (comment) Current home services: DME, Home PT, Home RN(rolling walker, wheelchair, stair lift, [facility]) Criminal Activity/Legal Involvement Pertinent to Current Situation/Hospitalization: No - Comment as needed  Activities of Daily Living      Permission Sought/Granted Permission sought to share information with : Case Manager, Customer service manager, PCP, Family Supports Permission granted to share information with : Yes, Verbal Permission Granted  Share Information with NAME: [name] Bossman  Permission granted to share info w AGENCY: [facility], SNF  Permission granted to share info w Relationship: husband  Permission granted to share info w Contact Information: [phone]  Emotional Assessment           Psych Involvement: No (comment)  Admission diagnosis:  Heart rate down, o2 Low, AMS Patient Active Problem List   Diagnosis Date Noted  . Lung nodule 08/11/2019  . Aortic stenosis 07/16/2017  . Painful orthopaedic hardware ([place]) 12/13/2015  . Closed Colles' fracture of left radius 06/14/2015  . Spinal stenosis of lumbar region 03/21/2015  . Spondylolisthesis at L4-L5 level 08/02/2014  . History of non  anemic vitamin B12 deficiency 01/02/2013  . History 01/02/2013  . History of pyloroplasty 01/02/2013  . History of Helicobacter pylori infection 01/02/2013  . History of depression 01/02/2013  . Recovering alcoholic in remission ([place]) 01/02/2013  . History of positive PPD 01/02/2013  . Anemia, iron deficiency 11/15/2012  . COPD  (chronic obstructive pulmonary disease) ([place]) 07/06/2012  . Hyperlipidemia 12/24/2010  . GE reflux 12/24/2010  . Chronic back pain 12/24/2010   PCP:  Elby Showers, MD Pharmacy:   [id number] [name], [place] [address] Phone: [phone] Fax: [phone]  [facility] [name] [place] [address] Phone: [phone] Fax: [phone]     Social Determinants of Health (SDOH) Interventions    Readmission Risk Interventions No flowsheet data found.

## 2019-08-26 NOTE — ED Notes (Signed)
Pt resting in bed at this time. Husband left at this time. Pts call bell within reach

## 2019-08-26 NOTE — NC FL2 (Signed)
[place] LEVEL OF CARE SCREENING TOOL     IDENTIFICATION  Patient Name: Katherine [name] Birthdate: 10/27/1928 Sex: female Admission Date (Current Location): 08/26/2019  [facility] and [place] Number:  Herbalist and Address:  [facility],  [place] [address] [place]      Provider Number: [id number]  Attending Physician Name and Address:  Valarie Merino, MD  Relative Name and Phone Number:       Current Level of Care: SNF Recommended Level of Care: [place] Prior Approval Number:    Date Approved/Denied:   PASRR Number: [id number]  Discharge Plan: SNF    Current Diagnoses: Patient Active Problem List   Diagnosis Date Noted  . Lung nodule 08/11/2019  . Aortic stenosis 07/16/2017  . Painful orthopaedic hardware ([place]) 12/13/2015  . Closed Colles' fracture of left radius 06/14/2015  . Spinal stenosis of lumbar region 03/21/2015  . Spondylolisthesis at L4-L5 level 08/02/2014  . History of non anemic vitamin B12 deficiency 01/02/2013  . History 01/02/2013  . History of pyloroplasty 01/02/2013  . History of Helicobacter pylori infection 01/02/2013  . History of depression 01/02/2013  . Recovering alcoholic in remission ([place]) 01/02/2013  . History of positive PPD 01/02/2013  . Anemia, iron deficiency 11/15/2012  . COPD (chronic obstructive pulmonary disease) ([place]) 07/06/2012  . Hyperlipidemia 12/24/2010  . GE reflux 12/24/2010  . Chronic back pain 12/24/2010    Orientation RESPIRATION BLADDER Height & Weight     Self, Place(Fluctuating orientation)  Normal Incontinent Weight:   Height:     BEHAVIORAL SYMPTOMS/MOOD NEUROLOGICAL BOWEL NUTRITION STATUS      Incontinent Diet(Heart Healthy)  AMBULATORY STATUS COMMUNICATION OF NEEDS Skin   Extensive Assist Verbally Normal                       Personal Care Assistance Level of Assistance  Bathing, Dressing Bathing Assistance: Limited  assistance   Dressing Assistance: Limited assistance     Functional Limitations Info             SPECIAL CARE FACTORS FREQUENCY  PT (By licensed PT), OT (By licensed OT)     PT Frequency: 5 OT Frequency: 5            Contractures Contractures Info: Not present    Additional Factors Info  Code Status, Allergies Code Status Info: FULL CODE Allergies Info: No Known Allergies           Current Medications (08/26/2019):  This is the current hospital active medication list Current Facility-Administered Medications  Medication Dose Route Frequency Provider Last Rate Last Admin  . albuterol (PROVENTIL) (2.5 MG/3ML) 0.083% nebulizer solution 2.5 mg  2.5 mg Nebulization Q4H PRN Valarie Merino, MD      . albuterol (VENTOLIN HFA) 108 (90 Base) MCG/ACT inhaler 2 puff  2 puff Inhalation QID PRN Valarie Merino, MD      . ALPRAZolam Duanne Moron) tablet 0.25 mg  0.25 mg Oral BID PRN Drenda Freeze, MD      . Fluticasone-Umeclidin-Vilant 100-62.5-25 MCG/INH AEPB 1 puff  1 puff Inhalation Daily Valarie Merino, MD      . megestrol (MEGACE) 40 MG/ML suspension 400 mg  400 mg Oral BID Valarie Merino, MD      . promethazine (PHENERGAN) tablet 12.5 mg  12.5 mg Oral Q8H PRN Drenda Freeze, MD      . traMADol Veatrice Bourbon) tablet 50 mg  50  mg Oral Q6H PRN Drenda Freeze, MD   50 mg at 08/26/19 1553  . traZODone (DESYREL) tablet 150 mg  150 mg Oral QHS Drenda Freeze, MD       Current Outpatient Medications  Medication Sig Dispense Refill  . albuterol (PROVENTIL) (2.5 MG/3ML) 0.083% nebulizer solution 1 vial in neb every 4-6 hours as needed  Dx: J44.9 (Patient taking differently: Take 2.5 mg by nebulization every 4 (four) hours as needed for wheezing or shortness of breath. 1 vial in neb every 4-6 hours as needed  Dx: J44.9) 150 mL 5  . albuterol (VENTOLIN HFA) 108 (90 Base) MCG/ACT inhaler INHALE 2 PUFFS INTO THE LUNGS 4 TIMES A DAY AS NEEDED (Patient taking differently: Inhale 2  puffs into the lungs 4 (four) times daily as needed for wheezing or shortness of breath. INHALE 2 PUFFS INTO THE LUNGS 4 TIMES A DAY AS NEEDED) 18 g 11  . ALPRAZolam (XANAX) 0.25 MG tablet Take 1 tablet (0.25 mg total) by mouth 2 (two) times daily as needed for anxiety. 30 tablet 5  . Fluticasone-Umeclidin-Vilant (TRELEGY ELLIPTA) 100-62.5-25 MCG/INH AEPB Inhale 1 puff into the lungs daily. 30 each 5  . megestrol (MEGACE) 40 MG/ML suspension Take 400 mg by mouth 2 (two) times daily.    . promethazine (PHENERGAN) 12.5 MG tablet TAKE 1 TABLET (12.5 MG TOTAL) BY MOUTH EVERY 8 (EIGHT) HOURS AS NEEDED FOR NAUSEA OR VOMITING. 90 tablet 0  . traMADol (ULTRAM) 50 MG tablet Take 50 mg by mouth every 6 (six) hours as needed for moderate pain.     . traZODone (DESYREL) 50 MG tablet TAKE 3 TABLETS BY MOUTH AT BEDTIME (Patient taking differently: Take 150 mg by mouth at bedtime as needed for sleep. ) 270 tablet 2  . atorvastatin (LIPITOR) 10 MG tablet TAKE 1 TABLET (10 MG TOTAL) BY MOUTH DAILY. (Patient not taking: Reported on 08/23/2019) 90 tablet 3  . HYDROcodone-acetaminophen (NORCO/VICODIN) 5-325 MG tablet Take 1-2 tablets by mouth every 6 (six) hours as needed. (Patient not taking: Reported on 08/26/2019) 15 tablet 0  . ipratropium (ATROVENT) 0.02 % nebulizer solution Take 2.5 mLs (0.5 mg total) by nebulization 4 (four) times daily. (Patient not taking: Reported on 08/26/2019) 75 mL 12  . megestrol (MEGACE) 40 MG tablet 1 tbs four times daily for appetite stimulation. (Patient not taking: Reported on 08/26/2019)       Discharge Medications: Please see discharge summary for a list of discharge medications.  Relevant Imaging Results:  Relevant Lab Results:   Additional Information [id number]  Alphonse Guild Abdulaziz Toman, LCSW

## 2019-08-27 DIAGNOSIS — R531 Weakness: Secondary | ICD-10-CM | POA: Diagnosis not present

## 2019-08-27 DIAGNOSIS — R278 Other lack of coordination: Secondary | ICD-10-CM | POA: Diagnosis not present

## 2019-08-27 DIAGNOSIS — R1313 Dysphagia, pharyngeal phase: Secondary | ICD-10-CM | POA: Diagnosis not present

## 2019-08-27 DIAGNOSIS — Z20828 Contact with and (suspected) exposure to other viral communicable diseases: Secondary | ICD-10-CM | POA: Diagnosis not present

## 2019-08-27 DIAGNOSIS — I350 Nonrheumatic aortic (valve) stenosis: Secondary | ICD-10-CM | POA: Diagnosis not present

## 2019-08-27 DIAGNOSIS — S31010D Laceration without foreign body of lower back and pelvis without penetration into retroperitoneum, subsequent encounter: Secondary | ICD-10-CM | POA: Diagnosis not present

## 2019-08-27 DIAGNOSIS — D509 Iron deficiency anemia, unspecified: Secondary | ICD-10-CM | POA: Diagnosis not present

## 2019-08-27 DIAGNOSIS — E785 Hyperlipidemia, unspecified: Secondary | ICD-10-CM | POA: Diagnosis not present

## 2019-08-27 DIAGNOSIS — Z7401 Bed confinement status: Secondary | ICD-10-CM | POA: Diagnosis not present

## 2019-08-27 DIAGNOSIS — R2689 Other abnormalities of gait and mobility: Secondary | ICD-10-CM | POA: Diagnosis not present

## 2019-08-27 DIAGNOSIS — G9340 Encephalopathy, unspecified: Secondary | ICD-10-CM | POA: Diagnosis not present

## 2019-08-27 DIAGNOSIS — Z9181 History of falling: Secondary | ICD-10-CM | POA: Diagnosis not present

## 2019-08-27 DIAGNOSIS — E46 Unspecified protein-calorie malnutrition: Secondary | ICD-10-CM | POA: Diagnosis not present

## 2019-08-27 DIAGNOSIS — G309 Alzheimer's disease, unspecified: Secondary | ICD-10-CM | POA: Diagnosis not present

## 2019-08-27 DIAGNOSIS — S32010D Wedge compression fracture of first lumbar vertebra, subsequent encounter for fracture with routine healing: Secondary | ICD-10-CM | POA: Diagnosis not present

## 2019-08-27 DIAGNOSIS — M2550 Pain in unspecified joint: Secondary | ICD-10-CM | POA: Diagnosis not present

## 2019-08-27 DIAGNOSIS — F0390 Unspecified dementia without behavioral disturbance: Secondary | ICD-10-CM | POA: Diagnosis not present

## 2019-08-27 DIAGNOSIS — M48061 Spinal stenosis, lumbar region without neurogenic claudication: Secondary | ICD-10-CM | POA: Diagnosis not present

## 2019-08-27 DIAGNOSIS — J449 Chronic obstructive pulmonary disease, unspecified: Secondary | ICD-10-CM | POA: Diagnosis not present

## 2019-08-27 LAB — URINE CULTURE: Culture: NO GROWTH

## 2019-08-27 NOTE — ED Notes (Signed)
Patient report given to [facility] at [facility].

## 2019-08-27 NOTE — ED Provider Notes (Signed)
Today's Vitals   08/27/19 0830 08/27/19 0845 08/27/19 0900 08/27/19 0915  BP: 108/88 122/60 125/74 100/81  Pulse: 93 83 79 (!) 102  Resp: (!) 39 (!) 28 (!) 22 (!) 23  Temp:      TempSrc:      SpO2: 98% 100% 100% 100%   There is no height or weight on file to calculate BMI.   Patient is awake and communicative.  Sitting with her husband.  No complaints.  Is awaiting placement.   Malvin Johns, MD 08/27/19 1044

## 2019-08-27 NOTE — ED Notes (Addendum)
Patient placed on bedpan. Patient has call bell in hand and patient will call out when she is finished.

## 2019-08-27 NOTE — ED Notes (Signed)
Patient given breakfast tray.

## 2019-08-27 NOTE — Progress Notes (Addendum)
3pm: CSW provided Katherine [name] at [facility] ED with handoff for this patient including the discharge plan. Patient can be discharged to [facility] immediately following the receipt of the insurance authorization.   CSW updated patient's husband [name] of information, he expressed gratitude for involvement.  2:30pm: CSW spoke with Lattie Haw at [facility] to set up the "Patient Profile" for the patient to receive auth for SNF placement. [place] has not approved patient for SNF as of now - CSW was advised to call back in an hour for an update.  1:30pm: CSW spoke with Beverely Low at [facility] to inquire about the status of the insurance authorization. Beverely Low requested additional clinical information be faxed for review, CSW sent information.  10:30am: CSW received call from [name] at [facility] who stated this patient can receive her 2nd dose of the COVID the first week of April at the facility.  CSW updated patient's husband [name] of information and he is agreeable to the discharge plan.  9:30am: CSW spoke with patient's husband [name] at [phone] to present him with bed offers. [name] has chosen for the patient to go to [facility].   CSW notified Janie at [facility] of the decision.  8:40am: CSW spoke with April in admissions at [facility] who agreed to review this patient's referral.  Patient has four bed offers at this time - [facility], [facility], [facility], and [facility].  Madilyn Fireman, MSW, LCSW-A Transitions of Care  Clinical Social Worker  [facility] Emergency Departments  Medical ICU [phone]

## 2019-08-27 NOTE — Progress Notes (Signed)
Patient will discharge to: [facility] Discharge date: 08/27/2019 Family notified: spouse Transport by: Corey Harold  Per MD patient is appropriate for discharge and will discharge to [facility] in room 3217. RN, patient, patient's family, and facility have been notified of discharge. Assessment, FL-2, PASRR, and discharge summary sent to facillity. RN was provided with the following number for report: [phone]. PTAR will be used to transfer patient to the facility. CSW will continue to follow for any discharge supports.

## 2019-08-30 ENCOUNTER — Encounter: Payer: Self-pay | Admitting: Internal Medicine

## 2019-08-30 ENCOUNTER — Telehealth: Payer: Self-pay | Admitting: Internal Medicine

## 2019-08-30 NOTE — Telephone Encounter (Signed)
Spoke with her husband today. He says she is not allowed visitors except through the window due to Covid-19 restrictions. He is concerned as he has really not received any progress report. She does not have cell phone there so I cannot call her. Told him [facility] has their own physician and staff will take orders from that physician only. I suggested he call the social worker to see if his and patient's concerns are being addressed.  She told him TV did not work and that she was not getting pain med as requested.  He will let me know what he learns from social worker but feels that she is a fall risk and that he cannot take care of her at the present time.

## 2019-08-30 NOTE — Telephone Encounter (Signed)
Patients spouse left a letter by front door that patient had been admitted to [facility] for rehab.

## 2019-09-02 DIAGNOSIS — R531 Weakness: Secondary | ICD-10-CM | POA: Diagnosis not present

## 2019-09-02 DIAGNOSIS — E46 Unspecified protein-calorie malnutrition: Secondary | ICD-10-CM | POA: Diagnosis not present

## 2019-09-02 DIAGNOSIS — J449 Chronic obstructive pulmonary disease, unspecified: Secondary | ICD-10-CM | POA: Diagnosis not present

## 2019-09-02 DIAGNOSIS — G309 Alzheimer's disease, unspecified: Secondary | ICD-10-CM | POA: Diagnosis not present

## 2019-09-05 ENCOUNTER — Telehealth: Payer: Self-pay | Admitting: Internal Medicine

## 2019-09-05 NOTE — Telephone Encounter (Signed)
Canceled appointment for next week, patient is at [facility]. LVM with [name] that we have canceled this appointment and if we need to cancel appointments in April to call and let us know.

## 2019-09-06 ENCOUNTER — Telehealth: Payer: Self-pay | Admitting: Internal Medicine

## 2019-09-06 NOTE — Telephone Encounter (Signed)
Spoke with Altha Harm to let her know once they have the plan for patients discharge from rehab and we receive discharge summary from the rehab, that we need to set up an appointment with her and patient to discuss need for FMLA. And to also get her on patients DPR, because at this time we can not speak to her about her grandmother. Christine verbalized understanding, so I will put this paperwork on hold at this time.

## 2019-09-06 NOTE — Telephone Encounter (Signed)
Received FMLA paperwork from [employer] for patients granddaughter. Patient is still in facility at this time. We need to talk with patient and granddaughter about what type of care patient is going to need.

## 2019-09-06 NOTE — Telephone Encounter (Signed)
LVM for Gracelyn Nurse (granddaughter) to call office to discuss FMLA paperwork.

## 2019-09-13 ENCOUNTER — Ambulatory Visit: Payer: Medicare PPO | Admitting: Internal Medicine

## 2019-09-14 ENCOUNTER — Telehealth: Payer: Self-pay | Admitting: Internal Medicine

## 2019-09-14 DIAGNOSIS — Z9181 History of falling: Secondary | ICD-10-CM | POA: Diagnosis not present

## 2019-09-14 DIAGNOSIS — S32010D Wedge compression fracture of first lumbar vertebra, subsequent encounter for fracture with routine healing: Secondary | ICD-10-CM | POA: Diagnosis not present

## 2019-09-14 DIAGNOSIS — S31010D Laceration without foreign body of lower back and pelvis without penetration into retroperitoneum, subsequent encounter: Secondary | ICD-10-CM | POA: Diagnosis not present

## 2019-09-14 NOTE — Telephone Encounter (Signed)
Faxed signed home health orders to [facility] for 08/19/2019 to 10/17/2019

## 2019-09-14 NOTE — Telephone Encounter (Signed)
Received home Health Care orders from [facility] for 08-19-19 to 10-17-19 to verify and sign.

## 2019-09-17 DIAGNOSIS — R1313 Dysphagia, pharyngeal phase: Secondary | ICD-10-CM | POA: Diagnosis not present

## 2019-09-17 DIAGNOSIS — I350 Nonrheumatic aortic (valve) stenosis: Secondary | ICD-10-CM | POA: Diagnosis not present

## 2019-09-17 DIAGNOSIS — R278 Other lack of coordination: Secondary | ICD-10-CM | POA: Diagnosis not present

## 2019-09-17 DIAGNOSIS — R2689 Other abnormalities of gait and mobility: Secondary | ICD-10-CM | POA: Diagnosis not present

## 2019-09-17 DIAGNOSIS — G9340 Encephalopathy, unspecified: Secondary | ICD-10-CM | POA: Diagnosis not present

## 2019-09-21 ENCOUNTER — Telehealth: Payer: Self-pay

## 2019-09-21 NOTE — Telephone Encounter (Signed)
Transition Care Management Follow-up Telephone Call  Date of discharge and from where: [facility]   How have you been since you were released from the hospital? She is a little better, but still weak, walks with a walker.   Any questions or concerns? Yes   Items Reviewed:  Did the pt receive and understand the discharge instructions provided? Yes   Medications obtained and verified? Yes   Any new allergies since your discharge? No   Dietary orders reviewed? Yes  Do you have support at home? Yes   Functional Questionnaire: (I = Independent and D = Dependent) ADLs: D   Bathing/Dressing- D  Meal Prep- D  Eating- D  Maintaining continence- I   Transferring/Ambulation- I   Managing Meds- D  Follow up appointments reviewed:   PCP Hospital f/u appt confirmed? Yes  Scheduled to see 09/26/2019.  [facility] f/u appt confirmed? No    Are transportation arrangements needed? Yes   If their condition worsens, is the pt aware to call PCP or go to the Emergency Dept.? Yes  Was the patient provided with contact information for the PCP's office or ED? Yes  Was to pt encouraged to call back with questions or concerns? Yes

## 2019-09-21 NOTE — Telephone Encounter (Signed)
Left message to call me back.

## 2019-09-24 DIAGNOSIS — R911 Solitary pulmonary nodule: Secondary | ICD-10-CM | POA: Diagnosis not present

## 2019-09-24 DIAGNOSIS — I350 Nonrheumatic aortic (valve) stenosis: Secondary | ICD-10-CM | POA: Diagnosis not present

## 2019-09-24 DIAGNOSIS — D649 Anemia, unspecified: Secondary | ICD-10-CM | POA: Diagnosis not present

## 2019-09-24 DIAGNOSIS — F1010 Alcohol abuse, uncomplicated: Secondary | ICD-10-CM | POA: Diagnosis not present

## 2019-09-24 DIAGNOSIS — J439 Emphysema, unspecified: Secondary | ICD-10-CM | POA: Diagnosis not present

## 2019-09-24 DIAGNOSIS — F419 Anxiety disorder, unspecified: Secondary | ICD-10-CM | POA: Diagnosis not present

## 2019-09-24 DIAGNOSIS — F0391 Unspecified dementia with behavioral disturbance: Secondary | ICD-10-CM | POA: Diagnosis not present

## 2019-09-24 DIAGNOSIS — M1990 Unspecified osteoarthritis, unspecified site: Secondary | ICD-10-CM | POA: Diagnosis not present

## 2019-09-24 DIAGNOSIS — E785 Hyperlipidemia, unspecified: Secondary | ICD-10-CM | POA: Diagnosis not present

## 2019-09-26 ENCOUNTER — Encounter: Payer: Self-pay | Admitting: Internal Medicine

## 2019-09-26 ENCOUNTER — Ambulatory Visit: Payer: Medicare PPO | Admitting: Internal Medicine

## 2019-09-26 ENCOUNTER — Other Ambulatory Visit: Payer: Self-pay

## 2019-09-26 VITALS — BP 120/80 | HR 96 | Temp 98.0°F | Ht 62.0 in | Wt 95.0 lb

## 2019-09-26 DIAGNOSIS — G8929 Other chronic pain: Secondary | ICD-10-CM

## 2019-09-26 DIAGNOSIS — J449 Chronic obstructive pulmonary disease, unspecified: Secondary | ICD-10-CM | POA: Diagnosis not present

## 2019-09-26 DIAGNOSIS — R531 Weakness: Secondary | ICD-10-CM

## 2019-09-26 DIAGNOSIS — K5903 Drug induced constipation: Secondary | ICD-10-CM | POA: Diagnosis not present

## 2019-09-26 DIAGNOSIS — R627 Adult failure to thrive: Secondary | ICD-10-CM

## 2019-09-26 DIAGNOSIS — I700 Atherosclerosis of aorta: Secondary | ICD-10-CM | POA: Diagnosis not present

## 2019-09-26 DIAGNOSIS — M7918 Myalgia, other site: Secondary | ICD-10-CM | POA: Diagnosis not present

## 2019-09-26 DIAGNOSIS — R634 Abnormal weight loss: Secondary | ICD-10-CM

## 2019-09-26 DIAGNOSIS — I350 Nonrheumatic aortic (valve) stenosis: Secondary | ICD-10-CM

## 2019-09-26 DIAGNOSIS — R7989 Other specified abnormal findings of blood chemistry: Secondary | ICD-10-CM | POA: Diagnosis not present

## 2019-09-26 MED ORDER — ALPRAZOLAM 0.25 MG PO TABS: 0.2500 mg | ORAL_TABLET | Freq: Two times a day (BID) | ORAL | 5 refills | Status: DC | PRN

## 2019-09-26 NOTE — Progress Notes (Signed)
   Subjective:    Patient ID: Katherine [name], female    DOB: Sep 15, 1928, 86 y.o.   MRN: [id number]  HPI  86 year old Female recently discharged from skilled care facility back to her home here for follow up. Was  seen in ED March 12 for altered mental status. Megace had previously been prescribed Feb. 22 at [facility] here for appetite stimulus and weight loss but apparently  caused nausea. Had acute confusional episode March 12 with weakness and fell off commode.Was taken to ED. Physician thought she was volume depleted but labs were unremarkable. Chest CT showed COPD and aortic atherosclerosis.MRI of brain showed no acute stroke or hemorrhage.  Was subsequently sent to [facility] nursing center for rehabilitation same day as ED evaluation directly from ED and was not admitted.  This was not a good experience for her.  She says the food was not good and she was not happy there.  She is now at home with [facility] home [facility] coming into the home and also has assistance with her husband and granddaughter.  She has a history of COPD followed by pulmonary.  In February, we noted she had lost 23 pounds since December 8.  In February she weighed 86 pounds.  Now weighs 95 pounds.  In January 2021 she suffered a fall at home and sustained an L1 mild compression fracture that has been slow to improve and created a lot of impairment for her due to pain.  Dr. Saintclair Halsted is her neurosurgeon.   Review of Systems main complaint is occasional nausea.  Appetite only failure.  Breathing is okay.  No chest pain.     Objective:   Physical Exam Weight today is 95 pounds.  BMI 17.48 Neck is supple.  No carotid bruits.  Cardiac exam regular rate and rhythm 3/6 systolic ejection murmur.  She is alert.  Speaks slowly.  Recognizes me.  No lower extremity edema.  Looks a bit pale.  Is lethargic.      Assessment & Plan:  Generalized weakness and deconditioning-prealbumin was 15 in February and will be repeated in 4 weeks.  TSH  in February was normal.  CBC in emergency department March 12 was normal.  Serum proteins were low in March.  Total protein was 6.4 and albumin was 3.4.  History of moderate aortic stenosis and mild aortic regurgitation.  COPD-treated with inhalers  Chronic back pain treated with tramadol  Weight loss- continue to monitor does not tolerate Megace because of nausea  Intermittent nausea- Treat sparingly with Phenergan but need to be careful because it can cause sedation  Constipation likely due to pain medications-MiraLAX recommended  Plan: Continue home health care.  Try to encourage increased caloric intake.  Follow-up in 4 weeks.

## 2019-09-26 NOTE — Patient Instructions (Addendum)
Pt will continue with home health services including PT and will return here  May 11.  CPE rescheduled for June.May take antinausea medication as needed.  No longer on Megace and patient says it caused nausea.

## 2019-09-28 DIAGNOSIS — J439 Emphysema, unspecified: Secondary | ICD-10-CM | POA: Diagnosis not present

## 2019-09-28 DIAGNOSIS — F419 Anxiety disorder, unspecified: Secondary | ICD-10-CM | POA: Diagnosis not present

## 2019-09-28 DIAGNOSIS — I350 Nonrheumatic aortic (valve) stenosis: Secondary | ICD-10-CM | POA: Diagnosis not present

## 2019-09-28 DIAGNOSIS — M1990 Unspecified osteoarthritis, unspecified site: Secondary | ICD-10-CM | POA: Diagnosis not present

## 2019-09-28 DIAGNOSIS — R911 Solitary pulmonary nodule: Secondary | ICD-10-CM | POA: Diagnosis not present

## 2019-09-28 DIAGNOSIS — D649 Anemia, unspecified: Secondary | ICD-10-CM | POA: Diagnosis not present

## 2019-09-28 DIAGNOSIS — F1010 Alcohol abuse, uncomplicated: Secondary | ICD-10-CM | POA: Diagnosis not present

## 2019-09-28 DIAGNOSIS — F0391 Unspecified dementia with behavioral disturbance: Secondary | ICD-10-CM | POA: Diagnosis not present

## 2019-09-28 DIAGNOSIS — E785 Hyperlipidemia, unspecified: Secondary | ICD-10-CM | POA: Diagnosis not present

## 2019-09-29 ENCOUNTER — Other Ambulatory Visit: Payer: Self-pay | Admitting: Internal Medicine

## 2019-10-01 DIAGNOSIS — R911 Solitary pulmonary nodule: Secondary | ICD-10-CM | POA: Diagnosis not present

## 2019-10-01 DIAGNOSIS — I350 Nonrheumatic aortic (valve) stenosis: Secondary | ICD-10-CM | POA: Diagnosis not present

## 2019-10-01 DIAGNOSIS — E785 Hyperlipidemia, unspecified: Secondary | ICD-10-CM | POA: Diagnosis not present

## 2019-10-01 DIAGNOSIS — F419 Anxiety disorder, unspecified: Secondary | ICD-10-CM | POA: Diagnosis not present

## 2019-10-01 DIAGNOSIS — F1010 Alcohol abuse, uncomplicated: Secondary | ICD-10-CM | POA: Diagnosis not present

## 2019-10-01 DIAGNOSIS — F0391 Unspecified dementia with behavioral disturbance: Secondary | ICD-10-CM | POA: Diagnosis not present

## 2019-10-01 DIAGNOSIS — D649 Anemia, unspecified: Secondary | ICD-10-CM | POA: Diagnosis not present

## 2019-10-01 DIAGNOSIS — M1990 Unspecified osteoarthritis, unspecified site: Secondary | ICD-10-CM | POA: Diagnosis not present

## 2019-10-01 DIAGNOSIS — J439 Emphysema, unspecified: Secondary | ICD-10-CM | POA: Diagnosis not present

## 2019-10-03 ENCOUNTER — Encounter: Payer: Self-pay | Admitting: Internal Medicine

## 2019-10-06 ENCOUNTER — Other Ambulatory Visit: Payer: Self-pay

## 2019-10-06 ENCOUNTER — Ambulatory Visit
Admission: RE | Admit: 2019-10-06 | Discharge: 2019-10-06 | Disposition: A | Discharge status: Home / Self Care | Payer: Medicare PPO | Source: Ambulatory Visit | Attending: Internal Medicine | Admitting: Internal Medicine

## 2019-10-06 ENCOUNTER — Telehealth: Payer: Self-pay

## 2019-10-06 DIAGNOSIS — M545 Low back pain: Secondary | ICD-10-CM | POA: Diagnosis not present

## 2019-10-06 DIAGNOSIS — W19XXXA Unspecified fall, initial encounter: Secondary | ICD-10-CM

## 2019-10-06 DIAGNOSIS — F419 Anxiety disorder, unspecified: Secondary | ICD-10-CM | POA: Diagnosis not present

## 2019-10-06 DIAGNOSIS — I350 Nonrheumatic aortic (valve) stenosis: Secondary | ICD-10-CM | POA: Diagnosis not present

## 2019-10-06 DIAGNOSIS — M533 Sacrococcygeal disorders, not elsewhere classified: Secondary | ICD-10-CM

## 2019-10-06 DIAGNOSIS — M1990 Unspecified osteoarthritis, unspecified site: Secondary | ICD-10-CM | POA: Diagnosis not present

## 2019-10-06 DIAGNOSIS — R911 Solitary pulmonary nodule: Secondary | ICD-10-CM | POA: Diagnosis not present

## 2019-10-06 DIAGNOSIS — D649 Anemia, unspecified: Secondary | ICD-10-CM | POA: Diagnosis not present

## 2019-10-06 DIAGNOSIS — F1010 Alcohol abuse, uncomplicated: Secondary | ICD-10-CM | POA: Diagnosis not present

## 2019-10-06 DIAGNOSIS — J439 Emphysema, unspecified: Secondary | ICD-10-CM | POA: Diagnosis not present

## 2019-10-06 DIAGNOSIS — E785 Hyperlipidemia, unspecified: Secondary | ICD-10-CM | POA: Diagnosis not present

## 2019-10-06 DIAGNOSIS — F0391 Unspecified dementia with behavioral disturbance: Secondary | ICD-10-CM | POA: Diagnosis not present

## 2019-10-06 NOTE — Telephone Encounter (Signed)
Patient's nurse called to let us know that patient told her that she hit her head on the bath tub when she fell and there no bruises or signs of concussion.

## 2019-10-06 NOTE — Telephone Encounter (Signed)
Left vm letting him know that we placed an order and he can walk in anytime at GI.

## 2019-10-06 NOTE — Telephone Encounter (Signed)
Please order LS spine films ar [facility]

## 2019-10-06 NOTE — Telephone Encounter (Signed)
Is she getting her LS spine Xray today? If she has no neuro symptoms  such as prolonged headache or neuro deficits, tell nurse just to watch her or have husband watch her for 12 hours after the fall.

## 2019-10-06 NOTE — Telephone Encounter (Signed)
Patient's husband was here this morning for blood work states patient fell yesterday morning on her tailbone patient is complaining of pain but he said that is almost everyday and he would like to know if you can order an xray just to be sure she didn't fracture anything he said he is trying to avoid sitting in the hospital for hours.

## 2019-10-06 NOTE — Telephone Encounter (Signed)
Katherine [name] was notified. No neuro symptoms.

## 2019-10-07 DIAGNOSIS — E785 Hyperlipidemia, unspecified: Secondary | ICD-10-CM | POA: Diagnosis not present

## 2019-10-07 DIAGNOSIS — F0391 Unspecified dementia with behavioral disturbance: Secondary | ICD-10-CM | POA: Diagnosis not present

## 2019-10-07 DIAGNOSIS — I350 Nonrheumatic aortic (valve) stenosis: Secondary | ICD-10-CM | POA: Diagnosis not present

## 2019-10-07 DIAGNOSIS — M1990 Unspecified osteoarthritis, unspecified site: Secondary | ICD-10-CM | POA: Diagnosis not present

## 2019-10-07 DIAGNOSIS — R911 Solitary pulmonary nodule: Secondary | ICD-10-CM | POA: Diagnosis not present

## 2019-10-07 DIAGNOSIS — D649 Anemia, unspecified: Secondary | ICD-10-CM | POA: Diagnosis not present

## 2019-10-07 DIAGNOSIS — F419 Anxiety disorder, unspecified: Secondary | ICD-10-CM | POA: Diagnosis not present

## 2019-10-07 DIAGNOSIS — J439 Emphysema, unspecified: Secondary | ICD-10-CM | POA: Diagnosis not present

## 2019-10-07 DIAGNOSIS — F1010 Alcohol abuse, uncomplicated: Secondary | ICD-10-CM | POA: Diagnosis not present

## 2019-10-11 ENCOUNTER — Telehealth: Payer: Self-pay | Admitting: Internal Medicine

## 2019-10-11 DIAGNOSIS — F0390 Unspecified dementia without behavioral disturbance: Secondary | ICD-10-CM | POA: Diagnosis not present

## 2019-10-11 DIAGNOSIS — R627 Adult failure to thrive: Secondary | ICD-10-CM | POA: Diagnosis not present

## 2019-10-11 DIAGNOSIS — J449 Chronic obstructive pulmonary disease, unspecified: Secondary | ICD-10-CM | POA: Diagnosis not present

## 2019-10-11 DIAGNOSIS — R634 Abnormal weight loss: Secondary | ICD-10-CM | POA: Diagnosis not present

## 2019-10-11 DIAGNOSIS — R531 Weakness: Secondary | ICD-10-CM | POA: Diagnosis not present

## 2019-10-11 DIAGNOSIS — Z9181 History of falling: Secondary | ICD-10-CM | POA: Diagnosis not present

## 2019-10-11 NOTE — Telephone Encounter (Signed)
Faxed signed [place] orders to [facility] for 09/24/2019 till [id number] to certify care. Phone number [phone] or fax [phone]. [id number] Order ID [id number]

## 2019-10-13 ENCOUNTER — Telehealth: Payer: Self-pay | Admitting: Internal Medicine

## 2019-10-13 NOTE — Telephone Encounter (Signed)
Received notification from [facility] that patient refused visit on 10/12/2019, because she was not feeling well and did not want to be disturbed.

## 2019-10-17 DIAGNOSIS — G9340 Encephalopathy, unspecified: Secondary | ICD-10-CM | POA: Diagnosis not present

## 2019-10-17 DIAGNOSIS — R2689 Other abnormalities of gait and mobility: Secondary | ICD-10-CM | POA: Diagnosis not present

## 2019-10-17 DIAGNOSIS — R1313 Dysphagia, pharyngeal phase: Secondary | ICD-10-CM | POA: Diagnosis not present

## 2019-10-17 DIAGNOSIS — I350 Nonrheumatic aortic (valve) stenosis: Secondary | ICD-10-CM | POA: Diagnosis not present

## 2019-10-17 DIAGNOSIS — R278 Other lack of coordination: Secondary | ICD-10-CM | POA: Diagnosis not present

## 2019-10-18 DIAGNOSIS — E785 Hyperlipidemia, unspecified: Secondary | ICD-10-CM | POA: Diagnosis not present

## 2019-10-18 DIAGNOSIS — F0391 Unspecified dementia with behavioral disturbance: Secondary | ICD-10-CM | POA: Diagnosis not present

## 2019-10-18 DIAGNOSIS — J439 Emphysema, unspecified: Secondary | ICD-10-CM | POA: Diagnosis not present

## 2019-10-18 DIAGNOSIS — F419 Anxiety disorder, unspecified: Secondary | ICD-10-CM | POA: Diagnosis not present

## 2019-10-18 DIAGNOSIS — M1990 Unspecified osteoarthritis, unspecified site: Secondary | ICD-10-CM | POA: Diagnosis not present

## 2019-10-18 DIAGNOSIS — I350 Nonrheumatic aortic (valve) stenosis: Secondary | ICD-10-CM | POA: Diagnosis not present

## 2019-10-18 DIAGNOSIS — D649 Anemia, unspecified: Secondary | ICD-10-CM | POA: Diagnosis not present

## 2019-10-18 DIAGNOSIS — F1010 Alcohol abuse, uncomplicated: Secondary | ICD-10-CM | POA: Diagnosis not present

## 2019-10-18 DIAGNOSIS — R911 Solitary pulmonary nodule: Secondary | ICD-10-CM | POA: Diagnosis not present

## 2019-10-20 ENCOUNTER — Other Ambulatory Visit: Payer: Medicare PPO | Admitting: Internal Medicine

## 2019-10-20 NOTE — Telephone Encounter (Signed)
Faxed signed orders to cancel any further appointments from [facility]

## 2019-10-21 DIAGNOSIS — J439 Emphysema, unspecified: Secondary | ICD-10-CM | POA: Diagnosis not present

## 2019-10-21 DIAGNOSIS — E785 Hyperlipidemia, unspecified: Secondary | ICD-10-CM | POA: Diagnosis not present

## 2019-10-21 DIAGNOSIS — M1990 Unspecified osteoarthritis, unspecified site: Secondary | ICD-10-CM | POA: Diagnosis not present

## 2019-10-21 DIAGNOSIS — R911 Solitary pulmonary nodule: Secondary | ICD-10-CM | POA: Diagnosis not present

## 2019-10-21 DIAGNOSIS — F419 Anxiety disorder, unspecified: Secondary | ICD-10-CM | POA: Diagnosis not present

## 2019-10-21 DIAGNOSIS — F0391 Unspecified dementia with behavioral disturbance: Secondary | ICD-10-CM | POA: Diagnosis not present

## 2019-10-21 DIAGNOSIS — I350 Nonrheumatic aortic (valve) stenosis: Secondary | ICD-10-CM | POA: Diagnosis not present

## 2019-10-21 DIAGNOSIS — F1010 Alcohol abuse, uncomplicated: Secondary | ICD-10-CM | POA: Diagnosis not present

## 2019-10-21 DIAGNOSIS — D649 Anemia, unspecified: Secondary | ICD-10-CM | POA: Diagnosis not present

## 2019-10-24 DIAGNOSIS — M1990 Unspecified osteoarthritis, unspecified site: Secondary | ICD-10-CM | POA: Diagnosis not present

## 2019-10-24 DIAGNOSIS — F419 Anxiety disorder, unspecified: Secondary | ICD-10-CM | POA: Diagnosis not present

## 2019-10-24 DIAGNOSIS — I350 Nonrheumatic aortic (valve) stenosis: Secondary | ICD-10-CM | POA: Diagnosis not present

## 2019-10-24 DIAGNOSIS — E785 Hyperlipidemia, unspecified: Secondary | ICD-10-CM | POA: Diagnosis not present

## 2019-10-24 DIAGNOSIS — D649 Anemia, unspecified: Secondary | ICD-10-CM | POA: Diagnosis not present

## 2019-10-24 DIAGNOSIS — J439 Emphysema, unspecified: Secondary | ICD-10-CM | POA: Diagnosis not present

## 2019-10-24 DIAGNOSIS — R911 Solitary pulmonary nodule: Secondary | ICD-10-CM | POA: Diagnosis not present

## 2019-10-24 DIAGNOSIS — F1010 Alcohol abuse, uncomplicated: Secondary | ICD-10-CM | POA: Diagnosis not present

## 2019-10-24 DIAGNOSIS — F0391 Unspecified dementia with behavioral disturbance: Secondary | ICD-10-CM | POA: Diagnosis not present

## 2019-10-25 ENCOUNTER — Other Ambulatory Visit: Payer: Self-pay

## 2019-10-25 ENCOUNTER — Encounter: Payer: Self-pay | Admitting: Internal Medicine

## 2019-10-25 ENCOUNTER — Ambulatory Visit: Payer: Medicare PPO | Admitting: Internal Medicine

## 2019-10-25 VITALS — BP 110/60 | HR 122 | Temp 97.7°F | Ht <= 58 in | Wt 89.0 lb

## 2019-10-25 DIAGNOSIS — I350 Nonrheumatic aortic (valve) stenosis: Secondary | ICD-10-CM

## 2019-10-25 DIAGNOSIS — J449 Chronic obstructive pulmonary disease, unspecified: Secondary | ICD-10-CM | POA: Diagnosis not present

## 2019-10-25 DIAGNOSIS — R634 Abnormal weight loss: Secondary | ICD-10-CM

## 2019-10-25 DIAGNOSIS — R627 Adult failure to thrive: Secondary | ICD-10-CM | POA: Diagnosis not present

## 2019-10-25 DIAGNOSIS — R531 Weakness: Secondary | ICD-10-CM

## 2019-10-25 NOTE — Progress Notes (Signed)
   Subjective:    Patient ID: Katherine [name], female    DOB: 1928/11/13, 86 y.o.   MRN: [id number]  HPI 86 year old Female here to follow up on weight loss and deconditioning. Not much appetite. Husband is very supportive and says she is trying to eat sandwiches with meat. Granddaughter is helping out also. After discussion with patient and her husband, she seems receptive to the idea of skilled nursing for PT and gait strengthening. Hx of L1 compression fracture and weight loss.  Hx of COPD. No longer smokes. Treated by Pulmonary with Trelegy.  We tried Megace for appetitie stimuant but she said it caused nausea. Have suggested Ensure. Takes Xanax sparing for anxiety. Usues Ventolin inhaler as well. Has taken Desyrel 150 mg hs for years.  History of moderate aortic stenosis.  Last echo was in 2019.  Not severe.  Review of Systems see above. Needs assistance with ambulation due to generalized weakness and lumbar back pain from L1 compression fracture.     Objective:   Physical Exam Weight is 89 pounds which is concerning Prealbumin in Feb was 15. In March weight was 99 pounds. Needs something for pain that will not aggravate constipation or cause mausea. We are going to try Tramadol.  Looks frail but pleasant and cooperative.  Her chest is clear to auscultation.  Vital signs are stable.  Cardiac exam 2/6 systolic ejection murmur.  No lower extremity edema.  She is alert.     Assessment & Plan:  Right T status post L1 compression fracture  Weight loss  Deconditioning  COPD-stable  Moderate aortic stenosis-not severe  Anxiety depression-treated with Xanax and as a real  Plan: Husband will check into skilled nursing facilities with physical therapy for rehab.

## 2019-10-25 NOTE — Patient Instructions (Signed)
Please take Tramadol with food to avoid nausea. Please consider skilled rehab facility for PT and monitoring caloric intake. RTC 4 weeks.

## 2019-10-28 DIAGNOSIS — Z029 Encounter for administrative examinations, unspecified: Secondary | ICD-10-CM

## 2019-10-29 DIAGNOSIS — F1010 Alcohol abuse, uncomplicated: Secondary | ICD-10-CM | POA: Diagnosis not present

## 2019-10-29 DIAGNOSIS — I350 Nonrheumatic aortic (valve) stenosis: Secondary | ICD-10-CM | POA: Diagnosis not present

## 2019-10-29 DIAGNOSIS — E785 Hyperlipidemia, unspecified: Secondary | ICD-10-CM | POA: Diagnosis not present

## 2019-10-29 DIAGNOSIS — R911 Solitary pulmonary nodule: Secondary | ICD-10-CM | POA: Diagnosis not present

## 2019-10-29 DIAGNOSIS — D649 Anemia, unspecified: Secondary | ICD-10-CM | POA: Diagnosis not present

## 2019-10-29 DIAGNOSIS — J439 Emphysema, unspecified: Secondary | ICD-10-CM | POA: Diagnosis not present

## 2019-10-29 DIAGNOSIS — F0391 Unspecified dementia with behavioral disturbance: Secondary | ICD-10-CM | POA: Diagnosis not present

## 2019-10-29 DIAGNOSIS — F419 Anxiety disorder, unspecified: Secondary | ICD-10-CM | POA: Diagnosis not present

## 2019-10-29 DIAGNOSIS — M1990 Unspecified osteoarthritis, unspecified site: Secondary | ICD-10-CM | POA: Diagnosis not present

## 2019-11-01 DIAGNOSIS — M1990 Unspecified osteoarthritis, unspecified site: Secondary | ICD-10-CM | POA: Diagnosis not present

## 2019-11-01 DIAGNOSIS — D649 Anemia, unspecified: Secondary | ICD-10-CM | POA: Diagnosis not present

## 2019-11-01 DIAGNOSIS — R911 Solitary pulmonary nodule: Secondary | ICD-10-CM | POA: Diagnosis not present

## 2019-11-01 DIAGNOSIS — I350 Nonrheumatic aortic (valve) stenosis: Secondary | ICD-10-CM | POA: Diagnosis not present

## 2019-11-01 DIAGNOSIS — F0391 Unspecified dementia with behavioral disturbance: Secondary | ICD-10-CM | POA: Diagnosis not present

## 2019-11-01 DIAGNOSIS — F1010 Alcohol abuse, uncomplicated: Secondary | ICD-10-CM | POA: Diagnosis not present

## 2019-11-01 DIAGNOSIS — E785 Hyperlipidemia, unspecified: Secondary | ICD-10-CM | POA: Diagnosis not present

## 2019-11-01 DIAGNOSIS — J439 Emphysema, unspecified: Secondary | ICD-10-CM | POA: Diagnosis not present

## 2019-11-01 DIAGNOSIS — F419 Anxiety disorder, unspecified: Secondary | ICD-10-CM | POA: Diagnosis not present

## 2019-11-04 DIAGNOSIS — M1990 Unspecified osteoarthritis, unspecified site: Secondary | ICD-10-CM | POA: Diagnosis not present

## 2019-11-04 DIAGNOSIS — I350 Nonrheumatic aortic (valve) stenosis: Secondary | ICD-10-CM | POA: Diagnosis not present

## 2019-11-04 DIAGNOSIS — J439 Emphysema, unspecified: Secondary | ICD-10-CM | POA: Diagnosis not present

## 2019-11-04 DIAGNOSIS — R911 Solitary pulmonary nodule: Secondary | ICD-10-CM | POA: Diagnosis not present

## 2019-11-04 DIAGNOSIS — F1010 Alcohol abuse, uncomplicated: Secondary | ICD-10-CM | POA: Diagnosis not present

## 2019-11-04 DIAGNOSIS — E785 Hyperlipidemia, unspecified: Secondary | ICD-10-CM | POA: Diagnosis not present

## 2019-11-04 DIAGNOSIS — D649 Anemia, unspecified: Secondary | ICD-10-CM | POA: Diagnosis not present

## 2019-11-04 DIAGNOSIS — F419 Anxiety disorder, unspecified: Secondary | ICD-10-CM | POA: Diagnosis not present

## 2019-11-04 DIAGNOSIS — F0391 Unspecified dementia with behavioral disturbance: Secondary | ICD-10-CM | POA: Diagnosis not present

## 2019-11-07 ENCOUNTER — Telehealth: Payer: Self-pay | Admitting: Internal Medicine

## 2019-11-07 DIAGNOSIS — J439 Emphysema, unspecified: Secondary | ICD-10-CM | POA: Diagnosis not present

## 2019-11-07 DIAGNOSIS — D649 Anemia, unspecified: Secondary | ICD-10-CM | POA: Diagnosis not present

## 2019-11-07 DIAGNOSIS — E785 Hyperlipidemia, unspecified: Secondary | ICD-10-CM | POA: Diagnosis not present

## 2019-11-07 DIAGNOSIS — F0391 Unspecified dementia with behavioral disturbance: Secondary | ICD-10-CM | POA: Diagnosis not present

## 2019-11-07 DIAGNOSIS — I350 Nonrheumatic aortic (valve) stenosis: Secondary | ICD-10-CM | POA: Diagnosis not present

## 2019-11-07 DIAGNOSIS — R911 Solitary pulmonary nodule: Secondary | ICD-10-CM | POA: Diagnosis not present

## 2019-11-07 DIAGNOSIS — M1990 Unspecified osteoarthritis, unspecified site: Secondary | ICD-10-CM | POA: Diagnosis not present

## 2019-11-07 DIAGNOSIS — F1010 Alcohol abuse, uncomplicated: Secondary | ICD-10-CM | POA: Diagnosis not present

## 2019-11-07 DIAGNOSIS — F419 Anxiety disorder, unspecified: Secondary | ICD-10-CM | POA: Diagnosis not present

## 2019-11-07 NOTE — Telephone Encounter (Signed)
Left detailed message for husband to take her to the hospital per Dr. Renold Genta.

## 2019-11-07 NOTE — Telephone Encounter (Signed)
Mark [id number] called from [facility] pt is very dehydrated increase confusion fatigue , dizziness, husband reported she has not eaten anything solid and has had one boost min liquid intake only urinating twice a day and its dark. Skin turger it stays. Husband wanted Elta Guadeloupe to call and if its ok if pt comes in for IV if possible they prefer not go to hosp. Or call husband at [phone] Please advise and Thank you!

## 2019-11-07 NOTE — Telephone Encounter (Signed)
I have called Katherine [name] and left message. Please call [name] husband and have her go to hospital. We do not give IVs here. Is he getting her a place at [facility]?

## 2019-11-08 DIAGNOSIS — R630 Anorexia: Secondary | ICD-10-CM | POA: Diagnosis not present

## 2019-11-08 DIAGNOSIS — E860 Dehydration: Secondary | ICD-10-CM | POA: Diagnosis not present

## 2019-11-08 DIAGNOSIS — Z9229 Personal history of other drug therapy: Secondary | ICD-10-CM | POA: Diagnosis not present

## 2019-11-09 DIAGNOSIS — D649 Anemia, unspecified: Secondary | ICD-10-CM | POA: Diagnosis not present

## 2019-11-09 DIAGNOSIS — J439 Emphysema, unspecified: Secondary | ICD-10-CM | POA: Diagnosis not present

## 2019-11-09 DIAGNOSIS — E785 Hyperlipidemia, unspecified: Secondary | ICD-10-CM | POA: Diagnosis not present

## 2019-11-09 DIAGNOSIS — I350 Nonrheumatic aortic (valve) stenosis: Secondary | ICD-10-CM | POA: Diagnosis not present

## 2019-11-09 DIAGNOSIS — M1990 Unspecified osteoarthritis, unspecified site: Secondary | ICD-10-CM | POA: Diagnosis not present

## 2019-11-09 DIAGNOSIS — F1010 Alcohol abuse, uncomplicated: Secondary | ICD-10-CM | POA: Diagnosis not present

## 2019-11-09 DIAGNOSIS — F419 Anxiety disorder, unspecified: Secondary | ICD-10-CM | POA: Diagnosis not present

## 2019-11-09 DIAGNOSIS — R911 Solitary pulmonary nodule: Secondary | ICD-10-CM | POA: Diagnosis not present

## 2019-11-09 DIAGNOSIS — F0391 Unspecified dementia with behavioral disturbance: Secondary | ICD-10-CM | POA: Diagnosis not present

## 2019-11-11 DIAGNOSIS — E785 Hyperlipidemia, unspecified: Secondary | ICD-10-CM | POA: Diagnosis not present

## 2019-11-11 DIAGNOSIS — D649 Anemia, unspecified: Secondary | ICD-10-CM | POA: Diagnosis not present

## 2019-11-11 DIAGNOSIS — R911 Solitary pulmonary nodule: Secondary | ICD-10-CM | POA: Diagnosis not present

## 2019-11-11 DIAGNOSIS — M1990 Unspecified osteoarthritis, unspecified site: Secondary | ICD-10-CM | POA: Diagnosis not present

## 2019-11-11 DIAGNOSIS — I350 Nonrheumatic aortic (valve) stenosis: Secondary | ICD-10-CM | POA: Diagnosis not present

## 2019-11-11 DIAGNOSIS — J439 Emphysema, unspecified: Secondary | ICD-10-CM | POA: Diagnosis not present

## 2019-11-11 DIAGNOSIS — F0391 Unspecified dementia with behavioral disturbance: Secondary | ICD-10-CM | POA: Diagnosis not present

## 2019-11-11 DIAGNOSIS — F1010 Alcohol abuse, uncomplicated: Secondary | ICD-10-CM | POA: Diagnosis not present

## 2019-11-11 DIAGNOSIS — F419 Anxiety disorder, unspecified: Secondary | ICD-10-CM | POA: Diagnosis not present

## 2019-11-15 ENCOUNTER — Other Ambulatory Visit: Payer: Self-pay

## 2019-11-15 ENCOUNTER — Encounter: Payer: Self-pay | Admitting: Pulmonary Disease

## 2019-11-15 ENCOUNTER — Ambulatory Visit (INDEPENDENT_AMBULATORY_CARE_PROVIDER_SITE_OTHER): Payer: Medicare PPO | Admitting: Pulmonary Disease

## 2019-11-15 ENCOUNTER — Telehealth: Payer: Self-pay | Admitting: Adult Health

## 2019-11-15 DIAGNOSIS — J449 Chronic obstructive pulmonary disease, unspecified: Secondary | ICD-10-CM | POA: Diagnosis not present

## 2019-11-15 DIAGNOSIS — R911 Solitary pulmonary nodule: Secondary | ICD-10-CM | POA: Diagnosis not present

## 2019-11-15 NOTE — Assessment & Plan Note (Signed)
Plan: Continue Trelegy Ellipta Close follow-up with our office with evaluation on 11/17/2019 with chest x-ray in person

## 2019-11-15 NOTE — Telephone Encounter (Signed)
ATC back x 1 line rings multiple times and someone answered but did not speak  Called back again and still no answer so LMTCB on VM x  1

## 2019-11-15 NOTE — Progress Notes (Signed)
Virtual Visit via Telephone Note  I connected with Katherine [name] on 11/15/19 at  2:00 PM EDT by telephone and verified that I am speaking with the correct person using two identifiers.  Location: Patient: Home Provider: [facility] - [id number], [place], [place], [place] [address]   I discussed the limitations, risks, security and privacy concerns of performing an evaluation and management service by telephone and the availability of in person appointments. I also discussed with the patient that there may be a patient responsible charge related to this service. The patient expressed understanding and agreed to proceed.  Patient consented to consult via telephone: Yes People present and their role in pt care: Pt     History of Present Illness:  86 year old female former smoker followed in our office for COPD  Past medical history: GERD, depression, back pain Smoking history: Former smoker Maintenance: Geophysicist/field seismologist  Patient of Dr. Lamonte Sakai  Chief complaint:    86 year old female former smoker followed in our office for COPD.  She is followed by Dr. Lamonte Sakai.  She is maintained on Trelegy Ellipta 100.  She last completed follow-up with our office in February/2021.  With TP NP at that time it was recommended the patient remain on Trelegy Ellipta 100.  Increase activity as tolerated.  Do a follow-up CT chest in 1 year to follow lung nodule and right lower lobe.  Referred to home health for home assistance.  Completing televisit with spouse and pt  Ongoing back pain.  They contacted our office out of concern of patient having worsened dyspnea over the last 2 to 3 weeks.  Patient never went to [facility] rehab to try to help with ongoing strength.  With no improvement.  Patient's been using her Trelegy Ellipta but using her rescue inhaler 4-5 times a day.  Due to increased shortness of breath.  No acute wheezing.  No fevers.   Observations/Objective:  Social  History   Tobacco Use  Smoking Status Former Smoker   Years: 60.00   Types: Cigarettes   Quit date: 02/02/2010   Years since quitting: 9.7  Smokeless Tobacco Never Used   Immunization History  Administered Date(s) Administered   Influenza Split 02/20/2018   Influenza Whole 01/29/2010   Influenza, High Dose Seasonal PF 04/13/2014, 02/12/2016, 03/10/2017, 03/19/2018, 04/11/2019   Influenza-Unspecified 03/16/2014   PFIZER SARS-COV-2 Vaccination 08/08/2019   Pneumococcal Conjugate-13 05/12/2016   Pneumococcal Polysaccharide-23 08/08/1996   Tdap 04/10/2003      Assessment and Plan:  Discussion: Worsened dyspnea on exertion.  Ongoing chronic back pain and physical deconditioning.  No audible wheezing or worsening cough.  We will proceed forward with close in-person evaluation to further evaluate dyspnea.  Patient also completed chest x-ray.  Lung nodule Plan: We will plan on follow-up CT chest in January/2022  COPD (chronic obstructive pulmonary disease) Plan: Continue Trelegy Ellipta Close follow-up with our office with evaluation on 11/17/2019 with chest x-ray in person   Follow Up Instructions:  Return in about 2 days (around 11/17/2019), or if symptoms worsen or fail to improve, for Follow up with Wyn Quaker FNP-C, With Chest Xray.   I discussed the assessment and treatment plan with the patient. The patient was provided an opportunity to ask questions and all were answered. The patient agreed with the plan and demonstrated an understanding of the instructions.   The patient was advised to call back or seek an in-person evaluation if the symptoms worsen or if the condition fails  to improve as anticipated.  I provided 23 minutes of non-face-to-face time during this encounter.   [name], NP

## 2019-11-15 NOTE — Telephone Encounter (Signed)
Sounds like she needs a visit  Could she do a televisit if she is not able to come in  , could see if [facility] NP has ov today or set up with APP tomorrow   Please contact office for sooner follow up if symptoms do not improve or worsen or seek emergency care

## 2019-11-15 NOTE — Telephone Encounter (Signed)
Tried calling the pt's spouse and there was no answer. LMTCB x 1

## 2019-11-15 NOTE — Assessment & Plan Note (Signed)
Plan: We will plan on follow-up CT chest in January/2022

## 2019-11-15 NOTE — Telephone Encounter (Signed)
Pt's spouse called back.  States that pt broke her back X3 months ago, is now back home X4 weeks, is undergoing PT in home through [facility] X2 times weekly.  Pt's spouse states that she is having increased shortness of breath with exertion, is requiring increased use of Albuterol inhaler- 10-12 puffs/daily.  Also has an atrovent neb, states that she is using this once daily.  Also using trelegy qam.   Pt denies cough, fever, mucus production.  Spouse check O2 sats multiple times daily, is always in the mid 90's%.    Spouse is concerned that pt is slow to recover after back sx, concerned that she is requiring so much albuterol.  Requesting TP's recs on how to manage albuterol inhaler/neb, and if she has any additional recs.    Will also need a refill on albuterol inhaler.    Pharmacy: [facility] [address]   TP please advise.  Thanks!

## 2019-11-15 NOTE — Patient Instructions (Addendum)
You were seen today by [name], NP  for:   1. Chronic obstructive pulmonary disease, unspecified COPD type ([place])  Continue Trelegy Ellipta 100  We will see you for close follow-up on 11/17/2019 at 4:30 pm with a chest x-ray  2. Lung nodule  We will repeat a CT of your chest in January/2022  Follow Up:    Return in about 2 days (around 11/17/2019), or if symptoms worsen or fail to improve, for Follow up with Wyn Quaker FNP-C, With Chest Xray.   Please do your part to reduce the spread of COVID-19:      Reduce your risk of any infection  and COVID19 by using the similar precautions used for avoiding the common cold or flu:  Marland Kitchen Wash your hands often with soap and warm water for at least 20 seconds.  If soap and water are not readily available, use an alcohol-based hand sanitizer with at least 60% alcohol.  . If coughing or sneezing, cover your mouth and nose by coughing or sneezing into the elbow areas of your shirt or coat, into a tissue or into your sleeve (not your hands). Langley Gauss A MASK when in public  . Avoid shaking hands with others and consider head nods or verbal greetings only. . Avoid touching your eyes, nose, or mouth with unwashed hands.  . Avoid close contact with people who are sick. . Avoid places or events with large numbers of people in one location, like concerts or sporting events. . If you have some symptoms but not all symptoms, continue to monitor at home and seek medical attention if your symptoms worsen. . If you are having a medical emergency, call 911.   [place] / e-Visit: [email or web address]         [facility]: [facility]: [id number]                   [facility]: [id number]     It is flu season:   >>> Best ways to protect herself from the flu: Receive the yearly flu vaccine, practice  good hand hygiene washing with soap and also using hand sanitizer when available, eat a nutritious meals, get adequate rest, hydrate appropriately   Please contact the office if your symptoms worsen or you have concerns that you are not improving.   Thank you for choosing [facility] for your healthcare, and for allowing [place] to partner with you on your healthcare journey. I am thankful to be able to provide care to you today.   Wyn Quaker FNP-C

## 2019-11-16 DIAGNOSIS — M1990 Unspecified osteoarthritis, unspecified site: Secondary | ICD-10-CM | POA: Diagnosis not present

## 2019-11-16 DIAGNOSIS — J439 Emphysema, unspecified: Secondary | ICD-10-CM | POA: Diagnosis not present

## 2019-11-16 DIAGNOSIS — I350 Nonrheumatic aortic (valve) stenosis: Secondary | ICD-10-CM | POA: Diagnosis not present

## 2019-11-16 DIAGNOSIS — F1010 Alcohol abuse, uncomplicated: Secondary | ICD-10-CM | POA: Diagnosis not present

## 2019-11-16 DIAGNOSIS — F0391 Unspecified dementia with behavioral disturbance: Secondary | ICD-10-CM | POA: Diagnosis not present

## 2019-11-16 DIAGNOSIS — E785 Hyperlipidemia, unspecified: Secondary | ICD-10-CM | POA: Diagnosis not present

## 2019-11-16 DIAGNOSIS — D649 Anemia, unspecified: Secondary | ICD-10-CM | POA: Diagnosis not present

## 2019-11-16 DIAGNOSIS — F419 Anxiety disorder, unspecified: Secondary | ICD-10-CM | POA: Diagnosis not present

## 2019-11-16 DIAGNOSIS — R911 Solitary pulmonary nodule: Secondary | ICD-10-CM | POA: Diagnosis not present

## 2019-11-16 NOTE — Telephone Encounter (Signed)
Pt was seen yesterday by Aaron Edelman. Will close encounter.

## 2019-11-17 ENCOUNTER — Ambulatory Visit (INDEPENDENT_AMBULATORY_CARE_PROVIDER_SITE_OTHER): Payer: Medicare PPO

## 2019-11-17 ENCOUNTER — Other Ambulatory Visit: Payer: Self-pay

## 2019-11-17 ENCOUNTER — Encounter: Payer: Self-pay | Admitting: Pulmonary Disease

## 2019-11-17 ENCOUNTER — Ambulatory Visit: Payer: Medicare PPO | Admitting: Pulmonary Disease

## 2019-11-17 VITALS — BP 112/68 | HR 97 | Temp 97.7°F | Ht 61.5 in | Wt 89.2 lb

## 2019-11-17 DIAGNOSIS — J449 Chronic obstructive pulmonary disease, unspecified: Secondary | ICD-10-CM | POA: Diagnosis not present

## 2019-11-17 DIAGNOSIS — R2689 Other abnormalities of gait and mobility: Secondary | ICD-10-CM | POA: Diagnosis not present

## 2019-11-17 DIAGNOSIS — I350 Nonrheumatic aortic (valve) stenosis: Secondary | ICD-10-CM | POA: Diagnosis not present

## 2019-11-17 DIAGNOSIS — R1313 Dysphagia, pharyngeal phase: Secondary | ICD-10-CM | POA: Diagnosis not present

## 2019-11-17 DIAGNOSIS — R911 Solitary pulmonary nodule: Secondary | ICD-10-CM | POA: Diagnosis not present

## 2019-11-17 DIAGNOSIS — G9340 Encephalopathy, unspecified: Secondary | ICD-10-CM | POA: Diagnosis not present

## 2019-11-17 DIAGNOSIS — R278 Other lack of coordination: Secondary | ICD-10-CM | POA: Diagnosis not present

## 2019-11-17 MED ORDER — PREDNISONE 10 MG PO TABS: ORAL_TABLET | ORAL | 0 refills | Status: DC

## 2019-11-17 MED ORDER — DOXYCYCLINE HYCLATE 100 MG PO TABS: 100.0000 mg | ORAL_TABLET | Freq: Two times a day (BID) | ORAL | 0 refills | Status: DC

## 2019-11-17 NOTE — Progress Notes (Signed)
@Patient  ID: Katherine [name], female    DOB: 10-Nov-1928, 86 y.o.   MRN: [id number]  Chief Complaint  Patient presents with   Follow-up    COPD, shortness of breath    Referring provider: Elby Showers, MD  HPI:  86 year old female former smoker followed in our office for COPD  Past medical history: GERD, depression, back pain Smoking history: Former smoker Maintenance: Geophysicist/field seismologist  Patient of Dr. Lamonte Sakai  11/17/2019  - Visit   86 year old female former smoker followed in our office for COPD. Patient completing 2-day follow-up from a virtual visit where patient was reporting increased shortness of breath and dyspnea on exertion. Chest x-ray performed today stable without effusion. Patient reports significant saba inhaler use. Patient reports adherence to Trelegy Ellipta. She is also been coughing up green purulent mucus. She reports that at her baseline she typically coughs up white to green mucus. She does feel that she is coughing up more than usual.  Questionaires / Pulmonary Flowsheets:   ACT:  No flowsheet data found.  MMRC: No flowsheet data found.  Epworth:  No flowsheet data found.  Tests:   11/17/2019-chest x-ray-no active cardiopulmonary disease, background of emphysema and chronic bronchitic changes  06/22/2018-pulmonary function test-FVC 1.72 (89% predicted), postbronchodilator ratio 56, post bronchodilator FEV1 0.97 (68% duty), no bronchodilator response, DLCO 5.14 (25% duty)  FENO:  No results found for: NITRICOXIDE  PFT: PFT Results Latest Ref Rng & Units 06/22/2018  FVC-Pre L 1.72  FVC-Predicted Pre % 89  FVC-Post L 1.74  FVC-Predicted Post % 89  Pre FEV1/FVC % % 54  Post FEV1/FCV % % 56  FEV1-Pre L 0.94  FEV1-Predicted Pre % 66  FEV1-Post L 0.97  DLCO UNC% % 25  DLCO COR %Predicted % 40  TLC L 4.91  TLC % Predicted % 106  RV % Predicted % 131    WALK:  SIX MIN WALK 05/19/2018  Supplimental Oxygen during Test? (L/min) No     Imaging: DG Chest 2 View  Result Date: 11/17/2019 CLINICAL DATA:  86 year old female with COPD. EXAM: CHEST - 2 VIEW COMPARISON:  Chest radiograph dated 08/26/2018 FINDINGS: Background of emphysema and chronic bronchitic changes. No focal consolidation, pleural effusion, or pneumothorax. The cardiac silhouette is within normal limits. Atherosclerotic calcification of the aorta. Osteopenia with multilevel degenerative changes and old appearing compression fractures. Sclerotic changes of the lower thoracic vertebra, likely related to prior vertebroplasty. Partially visualized posterior fusion hardware. No acute osseous pathology. IMPRESSION: 1. No active cardiopulmonary disease. 2. Aortic Atherosclerosis (ICD10-I70.0) and Emphysema (ICD10-J43.9). Electronically Signed   By: Anner Crete M.D.   On: 11/17/2019 16:26    Lab Results:  CBC    Component Value Date/Time   WBC 9.8 08/26/2019 1108   RBC 4.57 08/26/2019 1108   HGB 12.4 08/26/2019 1108   HCT 40.0 08/26/2019 1108   PLT 260 08/26/2019 1108   MCV 87.5 08/26/2019 1108   MCH 27.1 08/26/2019 1108   MCHC 31.0 08/26/2019 1108   RDW 15.0 08/26/2019 1108   LYMPHSABS 1.9 08/26/2019 1108   MONOABS 0.6 08/26/2019 1108   EOSABS 0.1 08/26/2019 1108   BASOSABS 0.1 08/26/2019 1108    BMET    Component Value Date/Time   NA 143 08/26/2019 1108   K 3.6 08/26/2019 1108   CL 112 (H) 08/26/2019 1108   CO2 22 08/26/2019 1108   GLUCOSE 95 08/26/2019 1108   BUN 15 08/26/2019 1108   CREATININE 1.10 (H) 08/26/2019 1108   CREATININE  1.22 (H) 08/08/2019 1218   CALCIUM 9.1 08/26/2019 1108   GFRNONAA 44 (L) 08/26/2019 1108   GFRNONAA 39 (L) 08/08/2019 1218   GFRAA 52 (L) 08/26/2019 1108   GFRAA 45 (L) 08/08/2019 1218    BNP No results found for: BNP  ProBNP No results found for: PROBNP  Specialty Problems      Pulmonary Problems   COPD (chronic obstructive pulmonary disease) (HCC)   Lung nodule      No Known  Allergies  Immunization History  Administered Date(s) Administered   Influenza Split 02/20/2018   Influenza Whole 01/29/2010   Influenza, High Dose Seasonal PF 04/13/2014, 02/12/2016, 03/10/2017, 03/19/2018, 04/11/2019   Influenza-Unspecified 03/16/2014   PFIZER SARS-COV-2 Vaccination 08/08/2019, 08/29/2019   Pneumococcal Conjugate-13 05/12/2016   Pneumococcal Polysaccharide-23 08/08/1996   Tdap 04/10/2003   Zoster Recombinat (Shingrix) 02/22/2019    Past Medical History:  Diagnosis Date   Alcoholic ([place])    36 years sober   Allergy    Anemia    Arthritis    Asthma    patient denies   B12 deficiency    COPD (chronic obstructive pulmonary disease) ([place])    Depression    not recent   Diverticulosis    Esophageal stricture    Headache    cluster headaches occasionally   Hepatic cyst 01/30/10   Hiatal hernia 1985   patient unaware   Hx of adenomatous colonic polyps 2006   Hyperlipidemia    IBS (irritable bowel syndrome)    PONV (postoperative nausea and vomiting)    no nausea or vomitting after surgery 07/2014   Positive PPD    PUD (peptic ulcer disease)    Shortness of breath dyspnea    SOB WITH EXERTION   (NOT NEW)    Tobacco History: Social History   Tobacco Use  Smoking Status Former Smoker   Years: 60.00   Types: Cigarettes   Quit date: 02/02/2010   Years since quitting: 9.7  Smokeless Tobacco Never Used   Counseling given: Yes   Continue to not smoke  Outpatient Encounter Medications as of 11/17/2019  Medication Sig   albuterol (PROVENTIL) (2.5 MG/3ML) 0.083% nebulizer solution 1 vial in neb every 4-6 hours as needed  Dx: J44.9 (Patient taking differently: Take 2.5 mg by nebulization every 4 (four) hours as needed for wheezing or shortness of breath. 1 vial in neb every 4-6 hours as needed  Dx: J44.9)   albuterol (VENTOLIN HFA) 108 (90 Base) MCG/ACT inhaler INHALE 2 PUFFS INTO THE LUNGS 4 TIMES A DAY AS NEEDED (Patient  taking differently: Inhale 2 puffs into the lungs 4 (four) times daily as needed for wheezing or shortness of breath. INHALE 2 PUFFS INTO THE LUNGS 4 TIMES A DAY AS NEEDED)   ALPRAZolam (XANAX) 0.25 MG tablet Take 1 tablet (0.25 mg total) by mouth 2 (two) times daily as needed for anxiety.   Fluticasone-Umeclidin-Vilant (TRELEGY ELLIPTA) 100-62.5-25 MCG/INH AEPB Inhale 1 puff into the lungs daily.   ipratropium (ATROVENT) 0.02 % nebulizer solution Take 2.5 mLs (0.5 mg total) by nebulization 4 (four) times daily.   megestrol (MEGACE) 40 MG tablet 1 tbs four times daily for appetite stimulation.   traMADol (ULTRAM) 50 MG tablet Take 50 mg by mouth every 6 (six) hours as needed for moderate pain.    traZODone (DESYREL) 50 MG tablet TAKE 3 TABLETS BY MOUTH AT BEDTIME (Patient taking differently: Take 150 mg by mouth at bedtime as needed for sleep. )   doxycycline (VIBRA-TABS)  100 MG tablet Take 1 tablet (100 mg total) by mouth 2 (two) times daily.   predniSONE (DELTASONE) 10 MG tablet 4 tabs for 2 days, then 3 tabs for 2 days, 2 tabs for 2 days, then 1 tab for 2 days, then stop   No facility-administered encounter medications on file as of 11/17/2019.     Review of Systems  Review of Systems  Constitutional: Positive for fatigue. Negative for activity change and fever.  HENT: Negative for sinus pressure, sinus pain and sore throat.   Respiratory: Positive for shortness of breath. Negative for cough and wheezing.   Cardiovascular: Negative for chest pain and palpitations.  Gastrointestinal: Negative for diarrhea, nausea and vomiting.  Musculoskeletal: Negative for arthralgias.  Neurological: Negative for dizziness.  Psychiatric/Behavioral: Negative for sleep disturbance. The patient is nervous/anxious.      Physical Exam  BP 112/68 (BP Location: Right Arm, Cuff Size: Normal)    Pulse 97    Temp 97.7 F (36.5 C) (Oral)    Ht 5' 1.5" (1.562 m)    Wt 89 lb 3.2 oz (40.5 kg)    SpO2 94%     BMI 16.58 kg/m   Wt Readings from Last 5 Encounters:  11/17/19 89 lb 3.2 oz (40.5 kg)  10/25/19 89 lb 0.1 oz (40.4 kg)  09/26/19 95 lb (43.1 kg)  08/23/19 99 lb (44.9 kg)  08/11/19 86 lb (39 kg)    BMI Readings from Last 5 Encounters:  11/17/19 16.58 kg/m  10/25/19 18.60 kg/m  09/26/19 17.38 kg/m  08/23/19 18.11 kg/m  08/11/19 15.99 kg/m     Physical Exam Vitals and nursing note reviewed.  Constitutional:      General: She is not in acute distress.    Appearance: Normal appearance.     Comments: Frail thin elderly female  HENT:     Head: Normocephalic and atraumatic.     Right Ear: Tympanic membrane, ear canal and external ear normal. There is no impacted cerumen.     Left Ear: Tympanic membrane, ear canal and external ear normal. There is no impacted cerumen.     Nose: Nose normal. No congestion or rhinorrhea.     Mouth/Throat:     Mouth: Mucous membranes are moist.     Pharynx: Oropharynx is clear.  Eyes:     Pupils: Pupils are equal, round, and reactive to light.  Cardiovascular:     Rate and Rhythm: Normal rate and regular rhythm.     Pulses: Normal pulses.     Heart sounds: Normal heart sounds. No murmur.  Pulmonary:     Effort: Pulmonary effort is normal. No respiratory distress.     Breath sounds: No decreased air movement. Wheezing (Slight expiratory wheeze) present. No decreased breath sounds or rales.  Musculoskeletal:     Cervical back: Normal range of motion.  Skin:    General: Skin is warm and dry.     Capillary Refill: Capillary refill takes less than 2 seconds.  Neurological:     General: No focal deficit present.     Mental Status: She is alert and oriented to person, place, and time. Mental status is at baseline.  Psychiatric:        Mood and Affect: Mood normal.        Behavior: Behavior normal.        Thought Content: Thought content normal.        Judgment: Judgment normal.       Assessment & Plan:  Lung nodule Plan: We will  repeat CT chest in January/2022  COPD (chronic obstructive pulmonary disease) Plan: Prednisone taper today Doxycycline today Chest x-ray today clear We will bring patient back in close follow-up to ensure symptomatic improvement Continue Trelegy Ellipta Use albuterol rescue inhaler or albuterol nebulizer every 4-6 hours as needed for shortness of breath or wheezing    Return in about 2 weeks (around 12/01/2019), or if symptoms worsen or fail to improve, for Follow up with Wyn Quaker FNP-C, Follow up with Tammy Parrett  ANP-BC, Follow up with Dr. Lamonte Sakai.   [name], NP 11/17/2019   This appointment required 35 minutes of patient care (this includes precharting, chart review, review of results, face-to-face care, etc.).

## 2019-11-17 NOTE — Assessment & Plan Note (Signed)
Plan: We will repeat CT chest in January/2022 

## 2019-11-17 NOTE — Assessment & Plan Note (Signed)
Plan: Prednisone taper today Doxycycline today Chest x-ray today clear We will bring patient back in close follow-up to ensure symptomatic improvement Continue Trelegy Ellipta Use albuterol rescue inhaler or albuterol nebulizer every 4-6 hours as needed for shortness of breath or wheezing

## 2019-11-17 NOTE — Patient Instructions (Addendum)
You were seen today by [name], NP  for:   1. Chronic obstructive pulmonary disease, unspecified COPD type ([place])  - predniSONE (DELTASONE) 10 MG tablet; 4 tabs for 2 days, then 3 tabs for 2 days, 2 tabs for 2 days, then 1 tab for 2 days, then stop  Dispense: 20 tablet; Refill: 0 - doxycycline (VIBRA-TABS) 100 MG tablet; Take 1 tablet (100 mg total) by mouth 2 (two) times daily.  Dispense: 14 tablet; Refill: 0  Doxycycline >>> 1 100 mg tablet every 12 hours for 7 days >>>take with food  >>>wear sunscreen   Prednisone 10mg  tablet  >>>4 tabs for 2 days, then 3 tabs for 2 days, 2 tabs for 2 days, then 1 tab for 2 days, then stop >>>take with food  >>>take in the morning   Trelegy Ellipta  >>> 1 puff daily in the morning >>>rinse mouth out after use  >>> This inhaler contains 3 medications that help manage her respiratory status, contact our office if you cannot afford this medication or unable to remain on this medication  Only use your albuterol as a rescue medication to be used if you can't catch your breath by resting or doing a relaxed purse lip breathing pattern.  - The less you use it, the better it will work when you need it. - Ok to use up to 2 puffs  every 4 hours if you must but call for immediate appointment if use goes up over your usual need - Don't leave home without it !!  (think of it like the spare tire for your car)    2. Lung nodule  Will repeat CT Chest in Jan 2022   We recommend today:   Meds ordered this encounter  Medications   predniSONE (DELTASONE) 10 MG tablet    Sig: 4 tabs for 2 days, then 3 tabs for 2 days, 2 tabs for 2 days, then 1 tab for 2 days, then stop    Dispense:  20 tablet    Refill:  0   doxycycline (VIBRA-TABS) 100 MG tablet    Sig: Take 1 tablet (100 mg total) by mouth 2 (two) times daily.    Dispense:  14 tablet    Refill:  0    Follow Up:    Return in about 2 weeks (around 12/01/2019), or if symptoms worsen or fail to  improve, for Follow up with Wyn Quaker FNP-C, Follow up with Tammy Parrett  ANP-BC, Follow up with Dr. Lamonte Sakai.   Please do your part to reduce the spread of COVID-19:      Reduce your risk of any infection  and COVID19 by using the similar precautions used for avoiding the common cold or flu:   Wash your hands often with soap and warm water for at least 20 seconds.  If soap and water are not readily available, use an alcohol-based hand sanitizer with at least 60% alcohol.   If coughing or sneezing, cover your mouth and nose by coughing or sneezing into the elbow areas of your shirt or coat, into a tissue or into your sleeve (not your hands).  WEAR A MASK when in public   Avoid shaking hands with others and consider head nods or verbal greetings only.  Avoid touching your eyes, nose, or mouth with unwashed hands.   Avoid close contact with people who are sick.  Avoid places or events with large numbers of people in one location, like concerts or sporting events.  If you have some symptoms but not all symptoms, continue to monitor at home and seek medical attention if your symptoms worsen.  If you are having a medical emergency, call 911.   [place] / e-Visit: [email or web address]         [facility]: [facility]: [id number]                   [facility]: [id number]     It is flu season:   >>> Best ways to protect herself from the flu: Receive the yearly flu vaccine, practice good hand hygiene washing with soap and also using hand sanitizer when available, eat a nutritious meals, get adequate rest, hydrate appropriately   Please contact the office if your symptoms worsen or you have concerns that you are not improving.   Thank you for choosing [facility] for your healthcare, and for allowing [place] to partner  with you on your healthcare journey. I am thankful to be able to provide care to you today.   Wyn Quaker FNP-C

## 2019-11-22 ENCOUNTER — Telehealth: Payer: Self-pay | Admitting: Internal Medicine

## 2019-11-22 DIAGNOSIS — E785 Hyperlipidemia, unspecified: Secondary | ICD-10-CM | POA: Diagnosis not present

## 2019-11-22 DIAGNOSIS — D649 Anemia, unspecified: Secondary | ICD-10-CM | POA: Diagnosis not present

## 2019-11-22 DIAGNOSIS — R911 Solitary pulmonary nodule: Secondary | ICD-10-CM | POA: Diagnosis not present

## 2019-11-22 DIAGNOSIS — F1010 Alcohol abuse, uncomplicated: Secondary | ICD-10-CM | POA: Diagnosis not present

## 2019-11-22 DIAGNOSIS — J439 Emphysema, unspecified: Secondary | ICD-10-CM | POA: Diagnosis not present

## 2019-11-22 DIAGNOSIS — F419 Anxiety disorder, unspecified: Secondary | ICD-10-CM | POA: Diagnosis not present

## 2019-11-22 DIAGNOSIS — M1990 Unspecified osteoarthritis, unspecified site: Secondary | ICD-10-CM | POA: Diagnosis not present

## 2019-11-22 DIAGNOSIS — I350 Nonrheumatic aortic (valve) stenosis: Secondary | ICD-10-CM | POA: Diagnosis not present

## 2019-11-22 DIAGNOSIS — F0391 Unspecified dementia with behavioral disturbance: Secondary | ICD-10-CM | POA: Diagnosis not present

## 2019-11-22 NOTE — Telephone Encounter (Signed)
Faxed [place] Orders to [facility] for Cardiopulmonary assessments weekly and report any changes to MD 09/24/19 to 11/22/19  11/13/19  -- 1 every wk 2wk2

## 2019-11-23 DIAGNOSIS — I350 Nonrheumatic aortic (valve) stenosis: Secondary | ICD-10-CM | POA: Diagnosis not present

## 2019-11-23 DIAGNOSIS — J439 Emphysema, unspecified: Secondary | ICD-10-CM | POA: Diagnosis not present

## 2019-11-23 DIAGNOSIS — E785 Hyperlipidemia, unspecified: Secondary | ICD-10-CM | POA: Diagnosis not present

## 2019-11-23 DIAGNOSIS — M1990 Unspecified osteoarthritis, unspecified site: Secondary | ICD-10-CM | POA: Diagnosis not present

## 2019-11-23 DIAGNOSIS — R911 Solitary pulmonary nodule: Secondary | ICD-10-CM | POA: Diagnosis not present

## 2019-11-23 DIAGNOSIS — F419 Anxiety disorder, unspecified: Secondary | ICD-10-CM | POA: Diagnosis not present

## 2019-11-23 DIAGNOSIS — F0391 Unspecified dementia with behavioral disturbance: Secondary | ICD-10-CM | POA: Diagnosis not present

## 2019-11-23 DIAGNOSIS — D649 Anemia, unspecified: Secondary | ICD-10-CM | POA: Diagnosis not present

## 2019-11-23 DIAGNOSIS — F1010 Alcohol abuse, uncomplicated: Secondary | ICD-10-CM | POA: Diagnosis not present

## 2019-11-24 ENCOUNTER — Telehealth: Payer: Self-pay | Admitting: Pulmonary Disease

## 2019-11-24 ENCOUNTER — Other Ambulatory Visit: Payer: Self-pay

## 2019-11-24 ENCOUNTER — Telehealth: Payer: Self-pay | Admitting: Internal Medicine

## 2019-11-24 ENCOUNTER — Other Ambulatory Visit: Payer: Medicare PPO | Admitting: Internal Medicine

## 2019-11-24 DIAGNOSIS — R634 Abnormal weight loss: Secondary | ICD-10-CM | POA: Diagnosis not present

## 2019-11-24 DIAGNOSIS — J449 Chronic obstructive pulmonary disease, unspecified: Secondary | ICD-10-CM | POA: Diagnosis not present

## 2019-11-24 DIAGNOSIS — R627 Adult failure to thrive: Secondary | ICD-10-CM | POA: Diagnosis not present

## 2019-11-24 DIAGNOSIS — I350 Nonrheumatic aortic (valve) stenosis: Secondary | ICD-10-CM | POA: Diagnosis not present

## 2019-11-24 DIAGNOSIS — R7989 Other specified abnormal findings of blood chemistry: Secondary | ICD-10-CM | POA: Diagnosis not present

## 2019-11-24 DIAGNOSIS — Z87891 Personal history of nicotine dependence: Secondary | ICD-10-CM

## 2019-11-24 DIAGNOSIS — Z8711 Personal history of peptic ulcer disease: Secondary | ICD-10-CM

## 2019-11-24 DIAGNOSIS — F419 Anxiety disorder, unspecified: Secondary | ICD-10-CM | POA: Diagnosis not present

## 2019-11-24 DIAGNOSIS — G8929 Other chronic pain: Secondary | ICD-10-CM | POA: Diagnosis not present

## 2019-11-24 DIAGNOSIS — Z0000 Encounter for general adult medical examination without abnormal findings: Secondary | ICD-10-CM

## 2019-11-24 DIAGNOSIS — K219 Gastro-esophageal reflux disease without esophagitis: Secondary | ICD-10-CM

## 2019-11-24 DIAGNOSIS — M545 Low back pain, unspecified: Secondary | ICD-10-CM

## 2019-11-24 DIAGNOSIS — I700 Atherosclerosis of aorta: Secondary | ICD-10-CM | POA: Diagnosis not present

## 2019-11-24 DIAGNOSIS — F329 Major depressive disorder, single episode, unspecified: Secondary | ICD-10-CM | POA: Diagnosis not present

## 2019-11-24 DIAGNOSIS — R531 Weakness: Secondary | ICD-10-CM | POA: Diagnosis not present

## 2019-11-24 NOTE — Telephone Encounter (Signed)
[place] orders to [facility] at [facility] [phone], office number [phone]. 11/23/2019 to 01/21/2020

## 2019-11-24 NOTE — Telephone Encounter (Signed)
error 

## 2019-11-25 ENCOUNTER — Other Ambulatory Visit: Payer: Self-pay

## 2019-11-25 DIAGNOSIS — R634 Abnormal weight loss: Secondary | ICD-10-CM

## 2019-11-26 LAB — CBC WITH DIFFERENTIAL/PLATELET
Absolute Monocytes: 551 cells/uL (ref 200–950)
Basophils Absolute: 8 cells/uL (ref 0–200)
Basophils Relative: 0.1 %
Eosinophils Absolute: 8 cells/uL — ABNORMAL LOW (ref 15–500)
Eosinophils Relative: 0.1 %
HCT: 41.3 % (ref 35.0–45.0)
Hemoglobin: 13 g/dL (ref 11.7–15.5)
Lymphs Abs: 2462 cells/uL (ref 850–3900)
MCH: 27 pg (ref 27.0–33.0)
MCHC: 31.5 g/dL — ABNORMAL LOW (ref 32.0–36.0)
MCV: 85.9 fL (ref 80.0–100.0)
MPV: 10.4 fL (ref 7.5–12.5)
Monocytes Relative: 6.8 %
Neutro Abs: 5071 cells/uL (ref 1500–7800)
Neutrophils Relative %: 62.6 %
Platelets: 365 10*3/uL (ref 140–400)
RBC: 4.81 10*6/uL (ref 3.80–5.10)
RDW: 13 % (ref 11.0–15.0)
Total Lymphocyte: 30.4 %
WBC: 8.1 10*3/uL (ref 3.8–10.8)

## 2019-11-26 LAB — COMPLETE METABOLIC PANEL WITH GFR
AG Ratio: 1.4 (calc) (ref 1.0–2.5)
ALT: 18 U/L (ref 6–29)
AST: 24 U/L (ref 10–35)
Albumin: 3.7 g/dL (ref 3.6–5.1)
Alkaline phosphatase (APISO): 80 U/L (ref 37–153)
BUN/Creatinine Ratio: 23 (calc) — ABNORMAL HIGH (ref 6–22)
BUN: 25 mg/dL (ref 7–25)
CO2: 27 mmol/L (ref 20–32)
Calcium: 9.8 mg/dL (ref 8.6–10.4)
Chloride: 104 mmol/L (ref 98–110)
Creat: 1.1 mg/dL — ABNORMAL HIGH (ref 0.60–0.88)
GFR, Est African American: 52 mL/min/{1.73_m2} — ABNORMAL LOW (ref >=60)
GFR, Est Non African American: 44 mL/min/{1.73_m2} — ABNORMAL LOW (ref >=60)
Globulin: 2.7 g/dL (calc) (ref 1.9–3.7)
Glucose, Bld: 90 mg/dL (ref 65–99)
Potassium: 3.9 mmol/L (ref 3.5–5.3)
Sodium: 141 mmol/L (ref 135–146)
Total Bilirubin: 0.4 mg/dL (ref 0.2–1.2)
Total Protein: 6.4 g/dL (ref 6.1–8.1)

## 2019-11-26 LAB — TEST AUTHORIZATION

## 2019-11-26 LAB — PREALBUMIN: Prealbumin: 20 mg/dL (ref 17–34)

## 2019-11-26 LAB — LIPID PANEL
Cholesterol: 210 mg/dL — ABNORMAL HIGH (ref <200)
HDL: 88 mg/dL (ref >=50)
LDL Cholesterol (Calc): 103 mg/dL (calc) — ABNORMAL HIGH
Non-HDL Cholesterol (Calc): 122 mg/dL (calc) (ref <130)
Total CHOL/HDL Ratio: 2.4 (calc) (ref <5.0)
Triglycerides: 97 mg/dL (ref <150)

## 2019-11-26 LAB — TSH: TSH: 2.97 mIU/L (ref 0.40–4.50)

## 2019-11-28 ENCOUNTER — Telehealth: Payer: Self-pay | Admitting: Internal Medicine

## 2019-11-28 NOTE — Telephone Encounter (Signed)
[place] orders to [facility]  PT to Evaluate  [place] to [place] PT in ADLS safety, TFS and HEP 1W4  Fax [phone] Phone [phone]

## 2019-11-29 ENCOUNTER — Encounter: Payer: Self-pay | Admitting: Internal Medicine

## 2019-11-29 ENCOUNTER — Ambulatory Visit: Payer: Medicare PPO | Admitting: Internal Medicine

## 2019-11-29 ENCOUNTER — Other Ambulatory Visit: Payer: Self-pay

## 2019-11-29 VITALS — BP 100/60 | HR 97 | Ht 61.5 in | Wt 88.0 lb

## 2019-11-29 DIAGNOSIS — F329 Major depressive disorder, single episode, unspecified: Secondary | ICD-10-CM

## 2019-11-29 DIAGNOSIS — M545 Low back pain, unspecified: Secondary | ICD-10-CM

## 2019-11-29 DIAGNOSIS — Z0000 Encounter for general adult medical examination without abnormal findings: Secondary | ICD-10-CM

## 2019-11-29 DIAGNOSIS — R531 Weakness: Secondary | ICD-10-CM | POA: Diagnosis not present

## 2019-11-29 DIAGNOSIS — S32010A Wedge compression fracture of first lumbar vertebra, initial encounter for closed fracture: Secondary | ICD-10-CM

## 2019-11-29 DIAGNOSIS — F32A Depression, unspecified: Secondary | ICD-10-CM

## 2019-11-29 DIAGNOSIS — F419 Anxiety disorder, unspecified: Secondary | ICD-10-CM | POA: Diagnosis not present

## 2019-11-29 DIAGNOSIS — R54 Age-related physical debility: Secondary | ICD-10-CM | POA: Diagnosis not present

## 2019-11-29 DIAGNOSIS — I700 Atherosclerosis of aorta: Secondary | ICD-10-CM

## 2019-11-29 DIAGNOSIS — Z9889 Other specified postprocedural states: Secondary | ICD-10-CM

## 2019-11-29 DIAGNOSIS — R634 Abnormal weight loss: Secondary | ICD-10-CM | POA: Diagnosis not present

## 2019-11-29 DIAGNOSIS — G8929 Other chronic pain: Secondary | ICD-10-CM

## 2019-11-29 DIAGNOSIS — M549 Dorsalgia, unspecified: Secondary | ICD-10-CM | POA: Diagnosis not present

## 2019-11-29 DIAGNOSIS — Z8711 Personal history of peptic ulcer disease: Secondary | ICD-10-CM

## 2019-11-29 DIAGNOSIS — I350 Nonrheumatic aortic (valve) stenosis: Secondary | ICD-10-CM

## 2019-11-29 DIAGNOSIS — J449 Chronic obstructive pulmonary disease, unspecified: Secondary | ICD-10-CM

## 2019-11-29 DIAGNOSIS — Z87891 Personal history of nicotine dependence: Secondary | ICD-10-CM

## 2019-11-29 NOTE — Progress Notes (Signed)
Subjective:    Patient ID: Katherine [name], female    DOB: [birth year], 86 y.o.   MRN: [id number]  HPI 86 year old Female for Medicare wellness, health maintenance exam and evaluation of medical issues.  In February 2020, her weight was down to 102 pounds and Megace was prescribed.  In April 2020, her weight increased by 3 pounds.  She was having lots of back pain.  She has a history of COPD and is followed by pulmonary.  In August 2020, she was in the emergency department with a COPD exacerbation.  CT of the chest showed no evidence of PE.  There was a 6 mm nodule in the superior segment of the right lower lobe and emphysematous changes throughout the lungs mostly in the upper lobes.  In December 2020 she had gained 4 pounds from July.  She was having considerable back pain despite being treated at [facility] pain management.  She was depressed because she could not do the activities that she used to do especially her housework.  In February 2016 she had a posterior lumbar interbody fusion at L4-L5.  In October 2016 she had transforaminal lumbar interbody fusion L3-L4 and posterior lumbar fusion L3-S1.  In 2017 she had removal of hardware at L5-S1 but has had continuous back pain ever since.  She had x-rays of her back December 2020 showing lower posterior fusion hardware around L3-L4.  She had anterior listhesis L4 on L5 and had severe disc height loss at L5-S1.  Unfortunately she suffered a compression fracture due to a fall at her home in January 2021.  She had a fall getting into bed striking the bedside table.  She was diagnosed in the emergency department with an L1 compression fracture after multiple x-ray studies were done including CT of abdomen and pelvis, CT of the C-spine, and CT of the chest as well as CT of the head.  In February 2021 she was seen here for follow-up, husband indicated she had had a kyphoplasty at L1 by radiologist in late January for pain relief after seeing her  neurosurgeon, Dr. Saintclair Halsted.  Her weight was down to 86 pounds and had been 109 pounds May 24, 2019.  She was started on Megace in February for appetite stimulant.  Unfortunately this caused nausea for her and she has been unable to take it.  She has had some generalized nausea off Megace as well.  She has medication for nausea at home.  In March 2021 she was seen in the emergency department with altered mental status and decreased appetite.  She apparently was sitting on the commode felt weak and fell over.  She underwent CT of the brain with contrast and chest x-ray.  No hemorrhage or stroke was noted.  No rib fractures.  She had aortic atherosclerosis.  She had emphysematous change on chest x-ray.  Husband felt he cannot take care of her and she was admitted to [facility].  She disliked the care there.  She does like the food there.  She subsequently came home in April 2021.  Her weight at that time was 95 pounds.  She was seen again in May 2021.  Weight was 89 pounds.  Discussion was held regarding skilled nursing care for PT and gait strengthening.  We completed a form for admission to [facility].  However patient did not want to go to this facility.  She has remained at home and continues to receive home physical therapy.  She now  weighs 88 pounds.  Her strength has improved since last time I saw her.  She seems more alert.  Says she is able to do more around her home.  Still having a lot of chronic pain.  Sometimes has nausea.  Prealbumin is now 20.  She has a history of peptic ulcer disease, hyperlipidemia, depression, and COPD.    She is followed by pulmonologist.  She is on Trelegy for COPD.  Apparently overuses albuterol inhaler at times.  She does have nebulizer available and has albuterol nebulizer solution.  She had an exacerbation of COPD and was seen by pulmonology June 3 treated with prednisone and doxycycline.  She is intolerant of aspirin as it causes GI symptoms.   Advil causes nausea.  Megace causes nausea.  History of needle guided excision of right breast calcification in 2008 by Dr. Margot Chimes.  Appendectomy 1940.  Hysterectomy with right oophorectomy for endometriosis 1967, vagotomy and pyloroplasty 1975, benign left breast biopsy 1976.  Bartholin cyst excised twice 1980 1982.  History of epidural steroid injections L5-S1 for back pain previously done by Dr. Niel Hummer.  Many years ago, she suffered with alcoholism but has been in recovery for many years.  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  She does not abuse prescription pain medication.  She has remote history of peptic ulcer disease.  History of hyperlipidemia and depression.  In 2014 she was found to be anemic and iron deficient.  Upper GI endoscopy showed stricture with no bleeding.  At that time she had strongly positive Hemoccult test.  Colonoscopy was incomplete due to tortuous colon.  Anemia improved with iron therapy.  Social history: 2 adult daughters in good health.  She completed high school and 2 years of college.  Smoked for over 40 years.  She and her husband have been very active in Alcoholics Anonymous.  This is her second marriage.  No children from second marriage.  She has 3 stepchildren.  Family history: Father died at age 50 with uremia.  Mother died at age 84.  26 sister died at age 55 with complications of alcoholism.  Another sister died at age 68 because of alcoholism and cancer.    Review of Systems she continues to have chronic back pain and dysphoria but her spirits today seem better than they have been several months.  Her husband is able to go out sometimes and get a break and she feels okay being alone.  Her granddaughter is supportive.  Has occasional bouts of nausea, has lethargy and fatigue and mild depression     Objective:   Physical Exam Blood pressure 100/60, pulse 97, pulse oximetry 96% weight 88 pounds BMI 16.36 Skin  warm and dry.  No cervical adenopathy.  TMs are clear.  No carotid bruits.  No thyromegaly.  Chest clear to auscultation.  Cardiac exam regular rate and rhythm.  2/6 systolic ejection murmur.  Abdomen soft nondistended without masses.  No lower extremity edema.  Affect is slightly depressed but improved over previous visits.  No lower extremity edema.  Neuro cranial nerves II through XII intact.  Affect and judgment appear to be normal.      Assessment & Plan:  Persistent chronic low back pain worse after L1 compression fracture due to a fall.  Continue home health and PT and home.  She has Ultram for pain.  Moderate aortic stenosis with mild regurgitation per echo in 2019-asymptomatic  Remote history of alcoholism  History of peptic ulcer disease status post vagotomy  Anorexia and weight loss-Megace cause nausea.  Encourage patient to eat as well as possible.  Prealbumin has improved from 15 in February to 20 at present.  History of smoking.  History of COPD treated with inhalers per pulmonologist  Mild depression.  At one point she was on benazepril but this was discontinued a while back.  She is back on the Xarelto at the present time.  Plan: Continue current medications and follow-up in 6 months or sooner if necessary.  Immunizations are up-to-date including COVID-19 vaccines.  She will need flu vaccine in the Fall.  Subjective:   Patient presents for Medicare Annual/Subsequent preventive examination.  Review Past Medical/Family/Social: See above   Risk Factors  Current exercise habits: Sedentary Dietary issues discussed: Needs calories to gain weight.  Discussion regarding diet  Cardiac risk factors: History of smoking  Depression Screen  (Note: if answer to either of the following is "Yes", a more complete depression screening is indicated)   Over the past two weeks, have you felt down, depressed or hopeless?  He has somewhat due to my situation Over the past two weeks,  have you felt little interest or pleasure in doing things? No Have you lost interest or pleasure in daily life? No Do you often feel hopeless?  Yes somewhat Do you cry easily over simple problems?  Sometimes  Activities of Daily Living  In your present state of health, do you have any difficulty performing the following activities?:   Driving? No longer driving Managing money? No  Feeding yourself? No  Getting from bed to chair? No  Climbing a flight of stairs?  He has Preparing food and eating?:  Yes needs some assistance Bathing or showering?  Can make myself when I am slow Getting dressed: No  Getting to the toilet? No  Using the toilet:No  Moving around from place to place: Yes In the past year have you fallen or had a near fall?:  Yes Are you sexually active? No  Do you have more than one partner? No   Hearing Difficulties: No  Do you often ask people to speak up or repeat themselves? No  Do you experience ringing or noises in your ears? No  Do you have difficulty understanding soft or whispered voices? No  Do you feel that you have a problem with memory?  Yes sometimes Do you often misplace items? No    Home Safety:  Do you have a smoke alarm at your residence? Yes Do you have grab bars in the bathroom?  Yes Do you have throw rugs in your house?  No   Cognitive Testing  Alert? Yes Normal Appearance?Yes  Oriented to person? Yes Place? Yes  Time? Yes  Recall of three objects? Yes  Can perform simple calculations?  Not tested Displays appropriate judgment?Yes  Can read the correct time from a watch face?Yes   List the Names of Other Physician/Practitioners you currently use:  See referral list for the physicians patient is currently seeing.  Pulmonologist  [facility]   Review of Systems: See above   Objective:     General appearance: Appears stated age and frail Head: Normocephalic, without obvious abnormality, atraumatic  Eyes: conj  clear, EOMi PEERLA  Ears: normal TM's and external ear canals both ears  Nose: Nares normal. Septum midline. Mucosa normal. No drainage or sinus tenderness.  Throat: lips, mucosa, and tongue normal; teeth and gums normal  Neck: no  adenopathy, no carotid bruit, no JVD, supple, symmetrical, trachea midline and thyroid not enlarged, symmetric, no tenderness/mass/nodules  No CVA tenderness.  Lungs: clear to auscultation bilaterally  Breasts: normal appearance, no masses or tenderness, Heart: regular rate and rhythm, S1, S2 normal, no murmur, click, rub or gallop  Abdomen: soft, non-tender; bowel sounds normal; no masses, no organomegaly  Musculoskeletal: Chronic low back pain Skin: Skin color, texture, turgor normal. No rashes or lesions  Lymph nodes: Cervical, supraclavicular, and axillary nodes normal.  Neurologic: CN 2 -12 Normal, Normal symmetric reflexes. Normal coordination and gait  Psych: Alert & Oriented x 3, Mood appear stable.    Assessment:    Annual wellness medicare exam   Plan:    During the course of the visit the patient was educated and counseled about appropriate screening and preventive services including:   Annual flu vaccine     Patient Instructions (the written plan) was given to the patient.  Medicare Attestation  I have personally reviewed:  The patient's medical and social history  Their use of alcohol, tobacco or illicit drugs  Their current medications and supplements  The patient's functional ability including ADLs,fall risks, home safety risks, cognitive, and hearing and visual impairment  Diet and physical activities  Evidence for depression or mood disorders  The patient's weight, height, BMI, and visual acuity have been recorded in the chart. I have made referrals, counseling, and provided education to the patient based on review of the above and I have provided the patient with a written personalized care plan for preventive services.

## 2019-11-30 DIAGNOSIS — F0391 Unspecified dementia with behavioral disturbance: Secondary | ICD-10-CM | POA: Diagnosis not present

## 2019-11-30 DIAGNOSIS — F419 Anxiety disorder, unspecified: Secondary | ICD-10-CM | POA: Diagnosis not present

## 2019-11-30 DIAGNOSIS — J439 Emphysema, unspecified: Secondary | ICD-10-CM | POA: Diagnosis not present

## 2019-11-30 DIAGNOSIS — F1010 Alcohol abuse, uncomplicated: Secondary | ICD-10-CM | POA: Diagnosis not present

## 2019-11-30 DIAGNOSIS — D649 Anemia, unspecified: Secondary | ICD-10-CM | POA: Diagnosis not present

## 2019-11-30 DIAGNOSIS — M1990 Unspecified osteoarthritis, unspecified site: Secondary | ICD-10-CM | POA: Diagnosis not present

## 2019-11-30 DIAGNOSIS — R911 Solitary pulmonary nodule: Secondary | ICD-10-CM | POA: Diagnosis not present

## 2019-11-30 DIAGNOSIS — I350 Nonrheumatic aortic (valve) stenosis: Secondary | ICD-10-CM | POA: Diagnosis not present

## 2019-11-30 DIAGNOSIS — E785 Hyperlipidemia, unspecified: Secondary | ICD-10-CM | POA: Diagnosis not present

## 2019-12-01 DIAGNOSIS — R911 Solitary pulmonary nodule: Secondary | ICD-10-CM | POA: Diagnosis not present

## 2019-12-01 DIAGNOSIS — F0391 Unspecified dementia with behavioral disturbance: Secondary | ICD-10-CM | POA: Diagnosis not present

## 2019-12-01 DIAGNOSIS — I350 Nonrheumatic aortic (valve) stenosis: Secondary | ICD-10-CM | POA: Diagnosis not present

## 2019-12-01 DIAGNOSIS — F1010 Alcohol abuse, uncomplicated: Secondary | ICD-10-CM | POA: Diagnosis not present

## 2019-12-01 DIAGNOSIS — J439 Emphysema, unspecified: Secondary | ICD-10-CM | POA: Diagnosis not present

## 2019-12-01 DIAGNOSIS — F419 Anxiety disorder, unspecified: Secondary | ICD-10-CM | POA: Diagnosis not present

## 2019-12-01 DIAGNOSIS — M1990 Unspecified osteoarthritis, unspecified site: Secondary | ICD-10-CM | POA: Diagnosis not present

## 2019-12-01 DIAGNOSIS — E785 Hyperlipidemia, unspecified: Secondary | ICD-10-CM | POA: Diagnosis not present

## 2019-12-01 DIAGNOSIS — D649 Anemia, unspecified: Secondary | ICD-10-CM | POA: Diagnosis not present

## 2019-12-02 DIAGNOSIS — R911 Solitary pulmonary nodule: Secondary | ICD-10-CM | POA: Diagnosis not present

## 2019-12-02 DIAGNOSIS — D649 Anemia, unspecified: Secondary | ICD-10-CM | POA: Diagnosis not present

## 2019-12-02 DIAGNOSIS — I350 Nonrheumatic aortic (valve) stenosis: Secondary | ICD-10-CM | POA: Diagnosis not present

## 2019-12-02 DIAGNOSIS — F0391 Unspecified dementia with behavioral disturbance: Secondary | ICD-10-CM | POA: Diagnosis not present

## 2019-12-02 DIAGNOSIS — J439 Emphysema, unspecified: Secondary | ICD-10-CM | POA: Diagnosis not present

## 2019-12-02 DIAGNOSIS — M1990 Unspecified osteoarthritis, unspecified site: Secondary | ICD-10-CM | POA: Diagnosis not present

## 2019-12-02 DIAGNOSIS — F1010 Alcohol abuse, uncomplicated: Secondary | ICD-10-CM | POA: Diagnosis not present

## 2019-12-02 DIAGNOSIS — E785 Hyperlipidemia, unspecified: Secondary | ICD-10-CM | POA: Diagnosis not present

## 2019-12-02 DIAGNOSIS — F419 Anxiety disorder, unspecified: Secondary | ICD-10-CM | POA: Diagnosis not present

## 2019-12-06 DIAGNOSIS — F0391 Unspecified dementia with behavioral disturbance: Secondary | ICD-10-CM | POA: Diagnosis not present

## 2019-12-06 DIAGNOSIS — F419 Anxiety disorder, unspecified: Secondary | ICD-10-CM | POA: Diagnosis not present

## 2019-12-06 DIAGNOSIS — R911 Solitary pulmonary nodule: Secondary | ICD-10-CM | POA: Diagnosis not present

## 2019-12-06 DIAGNOSIS — M1990 Unspecified osteoarthritis, unspecified site: Secondary | ICD-10-CM | POA: Diagnosis not present

## 2019-12-06 DIAGNOSIS — E785 Hyperlipidemia, unspecified: Secondary | ICD-10-CM | POA: Diagnosis not present

## 2019-12-06 DIAGNOSIS — F1010 Alcohol abuse, uncomplicated: Secondary | ICD-10-CM | POA: Diagnosis not present

## 2019-12-06 DIAGNOSIS — J439 Emphysema, unspecified: Secondary | ICD-10-CM | POA: Diagnosis not present

## 2019-12-06 DIAGNOSIS — I350 Nonrheumatic aortic (valve) stenosis: Secondary | ICD-10-CM | POA: Diagnosis not present

## 2019-12-06 DIAGNOSIS — D649 Anemia, unspecified: Secondary | ICD-10-CM | POA: Diagnosis not present

## 2019-12-08 ENCOUNTER — Encounter: Payer: Self-pay | Admitting: Pulmonary Disease

## 2019-12-08 ENCOUNTER — Telehealth: Payer: Self-pay | Admitting: Pulmonary Disease

## 2019-12-08 ENCOUNTER — Ambulatory Visit: Payer: Medicare PPO | Admitting: Pulmonary Disease

## 2019-12-08 ENCOUNTER — Other Ambulatory Visit: Payer: Self-pay

## 2019-12-08 VITALS — BP 116/74 | HR 107 | Temp 98.0°F | Ht 61.0 in | Wt 90.8 lb

## 2019-12-08 DIAGNOSIS — J449 Chronic obstructive pulmonary disease, unspecified: Secondary | ICD-10-CM | POA: Diagnosis not present

## 2019-12-08 DIAGNOSIS — R911 Solitary pulmonary nodule: Secondary | ICD-10-CM | POA: Diagnosis not present

## 2019-12-08 MED ORDER — IPRATROPIUM-ALBUTEROL 0.5-2.5 (3) MG/3ML IN SOLN: 3.0000 mL | Freq: Four times a day (QID) | RESPIRATORY_TRACT | 5 refills | Status: DC | PRN

## 2019-12-08 MED ORDER — IPRATROPIUM-ALBUTEROL 0.5-2.5 (3) MG/3ML IN SOLN: 3.0000 mL | Freq: Four times a day (QID) | RESPIRATORY_TRACT | 5 refills | Status: DC

## 2019-12-08 MED ORDER — BUDESONIDE 0.5 MG/2ML IN SUSP
0.5000 mg | Freq: Two times a day (BID) | RESPIRATORY_TRACT | 5 refills | Status: DC
Start: 2019-12-08 — End: 2020-08-31

## 2019-12-08 NOTE — Progress Notes (Signed)
@Patient  ID: Katherine [name], female    DOB: [birth year], 86 y.o.   MRN: [id number]  Chief Complaint  Patient presents with  . Follow-up    breathing is good and bad depending on day, using albuterol inhaler more often, nebs 3 to 4 times a day    Referring provider: Elby Showers, MD  HPI:  86 year old female former smoker followed in our office for COPD  Past medical history: GERD, depression, back pain Smoking history: Former smoker Maintenance: Geophysicist/field seismologist  Patient of Dr. Lamonte Sakai  12/08/2019  - Visit   86 year old female former smoker followed in our office for COPD. Patient was last seen in our office on 11/17/2019 at that time it was recommended the patient be started on prednisone taper as well as doxycycline.  She was encouraged to remain on Trelegy Ellipta and she was scheduled to have close follow-up with our office in 2 weeks.  Patient is presenting today for that follow-up.  Patient reporting today she has had some symptomatic relief since being treated with prednisone as well as doxycycline.  She still has multiple episodes a day of dyspnea.  Patient continues to overuse albuterol.  She reports she is using her albuterol nebs 3-4 times a day.  She is also gone through 2 albuterol HFA inhalers in the last 3 weeks.  Patient reports adherence to Trelegy Ellipta but she is unsure whether or not she is actually adequately inhaling the medication.  We will discuss this today.  Questionaires / Pulmonary Flowsheets:   ACT:  No flowsheet data found.  MMRC: No flowsheet data found.  Epworth:  No flowsheet data found.  Tests:   11/17/2019-chest x-ray-no active cardiopulmonary disease, background of emphysema and chronic bronchitic changes  06/22/2018-pulmonary function test-FVC 1.72 (89% predicted), postbronchodilator ratio 56, post bronchodilator FEV1 0.97 (68% duty), no bronchodilator response, DLCO 5.14 (25% duty)  FENO:  No results found for:  NITRICOXIDE  PFT: PFT Results Latest Ref Rng & Units 06/22/2018  FVC-Pre L 1.72  FVC-Predicted Pre % 89  FVC-Post L 1.74  FVC-Predicted Post % 89  Pre FEV1/FVC % % 54  Post FEV1/FCV % % 56  FEV1-Pre L 0.94  FEV1-Predicted Pre % 66  FEV1-Post L 0.97  DLCO UNC% % 25  DLCO COR %Predicted % 40  TLC L 4.91  TLC % Predicted % 106  RV % Predicted % 131    WALK:  SIX MIN WALK 05/19/2018  Supplimental Oxygen during Test? (L/min) No    Imaging: DG Chest 2 View  Result Date: 11/17/2019 CLINICAL DATA:  86 year old female with COPD. EXAM: CHEST - 2 VIEW COMPARISON:  Chest radiograph dated 08/26/2018 FINDINGS: Background of emphysema and chronic bronchitic changes. No focal consolidation, pleural effusion, or pneumothorax. The cardiac silhouette is within normal limits. Atherosclerotic calcification of the aorta. Osteopenia with multilevel degenerative changes and old appearing compression fractures. Sclerotic changes of the lower thoracic vertebra, likely related to prior vertebroplasty. Partially visualized posterior fusion hardware. No acute osseous pathology. IMPRESSION: 1. No active cardiopulmonary disease. 2. Aortic Atherosclerosis (ICD10-I70.0) and Emphysema (ICD10-J43.9). Electronically Signed   By: Anner Crete M.D.   On: 11/17/2019 16:26    Lab Results:  CBC    Component Value Date/Time   WBC 8.1 11/24/2019 1138   RBC 4.81 11/24/2019 1138   HGB 13.0 11/24/2019 1138   HCT 41.3 11/24/2019 1138   PLT 365 11/24/2019 1138   MCV 85.9 11/24/2019 1138   MCH 27.0 11/24/2019 1138   MCHC  31.5 (L) 11/24/2019 1138   RDW 13.0 11/24/2019 1138   LYMPHSABS 2,462 11/24/2019 1138   MONOABS 0.6 08/26/2019 1108   EOSABS 8 (L) 11/24/2019 1138   BASOSABS 8 11/24/2019 1138    BMET    Component Value Date/Time   NA 141 11/24/2019 1138   K 3.9 11/24/2019 1138   CL 104 11/24/2019 1138   CO2 27 11/24/2019 1138   GLUCOSE 90 11/24/2019 1138   BUN 25 11/24/2019 1138   CREATININE 1.10 (H)  11/24/2019 1138   CALCIUM 9.8 11/24/2019 1138   GFRNONAA 44 (L) 11/24/2019 1138   GFRAA 52 (L) 11/24/2019 1138    BNP No results found for: BNP  ProBNP No results found for: PROBNP  Specialty Problems      Pulmonary Problems   COPD (chronic obstructive pulmonary disease) (HCC)   Lung nodule      No Known Allergies  Immunization History  Administered Date(s) Administered  . Influenza Split 02/20/2018  . Influenza Whole 01/29/2010  . Influenza, High Dose Seasonal PF 04/13/2014, 02/12/2016, 03/10/2017, 03/19/2018, 04/11/2019  . Influenza-Unspecified 03/16/2014  . PFIZER SARS-COV-2 Vaccination 08/08/2019, 08/29/2019  . Pneumococcal Conjugate-13 05/12/2016  . Pneumococcal Polysaccharide-23 08/08/1996  . Tdap 04/10/2003  . Zoster Recombinat (Shingrix) 02/22/2019    Past Medical History:  Diagnosis Date  . Alcoholic ([place])    36 years sober  . Allergy   . Anemia   . Arthritis   . Asthma    patient denies  . B12 deficiency   . COPD (chronic obstructive pulmonary disease) ([place])   . Depression    not recent  . Diverticulosis   . Esophageal stricture   . Headache    cluster headaches occasionally  . Hepatic cyst 01/30/10  . Hiatal hernia 1985   patient unaware  . Hx of adenomatous colonic polyps 2006  . Hyperlipidemia   . IBS (irritable bowel syndrome)   . PONV (postoperative nausea and vomiting)    no nausea or vomitting after surgery 07/2014  . Positive PPD   . PUD (peptic ulcer disease)   . Shortness of breath dyspnea    SOB WITH EXERTION   (NOT NEW)    Tobacco History: Social History   Tobacco Use  Smoking Status Former Smoker  . Years: 60.00  . Types: Cigarettes  . Quit date: 02/02/2010  . Years since quitting: 9.8  Smokeless Tobacco Never Used   Counseling given: Not Answered   Continue to not smoke  Outpatient Encounter Medications as of 12/08/2019  Medication Sig  . albuterol (PROVENTIL) (2.5 MG/3ML) 0.083% nebulizer solution 1 vial in neb  every 4-6 hours as needed  Dx: J44.9 (Patient taking differently: Take 2.5 mg by nebulization every 4 (four) hours as needed for wheezing or shortness of breath. 1 vial in neb every 4-6 hours as needed  Dx: J44.9)  . albuterol (VENTOLIN HFA) 108 (90 Base) MCG/ACT inhaler INHALE 2 PUFFS INTO THE LUNGS 4 TIMES A DAY AS NEEDED (Patient taking differently: Inhale 2 puffs into the lungs 4 (four) times daily as needed for wheezing or shortness of breath. INHALE 2 PUFFS INTO THE LUNGS 4 TIMES A DAY AS NEEDED)  . ipratropium (ATROVENT) 0.02 % nebulizer solution Take 2.5 mLs (0.5 mg total) by nebulization 4 (four) times daily.  . traMADol (ULTRAM) 50 MG tablet Take 50 mg by mouth every 6 (six) hours as needed for moderate pain.   . traZODone (DESYREL) 50 MG tablet TAKE 3 TABLETS BY MOUTH AT BEDTIME (Patient  taking differently: Take 150 mg by mouth at bedtime as needed for sleep. )  . [DISCONTINUED] Fluticasone-Umeclidin-Vilant (TRELEGY ELLIPTA) 100-62.5-25 MCG/INH AEPB Inhale 1 puff into the lungs daily.  . budesonide (PULMICORT) 0.5 MG/2ML nebulizer solution Take 2 mLs (0.5 mg total) by nebulization 2 (two) times daily.  Marland Kitchen ipratropium-albuterol (DUONEB) 0.5-2.5 (3) MG/3ML SOLN Take 3 mLs by nebulization every 6 (six) hours as needed.   No facility-administered encounter medications on file as of 12/08/2019.     Review of Systems  Review of Systems  Constitutional: Positive for fatigue. Negative for activity change and fever.  HENT: Negative for sinus pressure, sinus pain and sore throat.   Respiratory: Positive for shortness of breath. Negative for cough and wheezing.   Cardiovascular: Negative for chest pain and palpitations.  Gastrointestinal: Negative for diarrhea, nausea and vomiting.  Musculoskeletal: Negative for arthralgias.  Neurological: Negative for dizziness.  Psychiatric/Behavioral: Negative for sleep disturbance. The patient is not nervous/anxious.      Physical Exam  BP 116/74 (BP  Location: Right Arm, Cuff Size: Small)   Pulse (!) 107   Temp 98 F (36.7 C) (Oral)   Ht 5\' 1"  (1.549 m)   Wt 90 lb 12.8 oz (41.2 kg)   SpO2 95% Comment: RA  BMI 17.16 kg/m   Wt Readings from Last 5 Encounters:  12/08/19 90 lb 12.8 oz (41.2 kg)  11/29/19 88 lb (39.9 kg)  11/17/19 89 lb 3.2 oz (40.5 kg)  10/25/19 89 lb 0.1 oz (40.4 kg)  09/26/19 95 lb (43.1 kg)    BMI Readings from Last 5 Encounters:  12/08/19 17.16 kg/m  11/29/19 16.36 kg/m  11/17/19 16.58 kg/m  10/25/19 18.60 kg/m  09/26/19 17.38 kg/m     Physical Exam Vitals and nursing note reviewed.  Constitutional:      General: She is not in acute distress.    Appearance: Normal appearance.     Comments: Thin frail elderly female  HENT:     Head: Normocephalic and atraumatic.     Right Ear: External ear normal.     Left Ear: External ear normal.  Cardiovascular:     Rate and Rhythm: Normal rate and regular rhythm.     Pulses: Normal pulses.     Heart sounds: Normal heart sounds. No murmur heard.   Pulmonary:     Effort: Pulmonary effort is normal. No respiratory distress.     Breath sounds: No decreased air movement. No decreased breath sounds, wheezing or rales.     Comments: Diminished throughout exam Musculoskeletal:     Cervical back: Normal range of motion.  Skin:    General: Skin is warm and dry.     Capillary Refill: Capillary refill takes less than 2 seconds.  Neurological:     General: No focal deficit present.     Mental Status: She is alert and oriented to person, place, and time. Mental status is at baseline.     Motor: Weakness present.     Gait: Gait abnormal.  Psychiatric:        Mood and Affect: Mood normal.        Behavior: Behavior normal.        Thought Content: Thought content normal.        Judgment: Judgment normal.       Assessment & Plan:   Lung nodule Plan: We will repeat CT chest in January/2022  COPD (chronic obstructive pulmonary disease) Discussed case  with Dr. Lamonte Sakai today. High likelihood patient is  not mobilizing the Ellipta device well We will stop Trelegy Ellipta Out of concern for overuse of medications we will try to simplify patient's bronchodilator regimen.  Plan: Start budesonide nebs twice daily Start DuoNeb scheduled every 6 hours Stop Trelegy Ellipta Close follow-up with our office to see Dr. Lamonte Sakai in August/2021 If symptoms or not improving despite these interventions may need to consider daily prednisone Need to consider and evaluate at future office visits referral to palliative care, patient and family were not interested today     Return in about 6 weeks (around 01/19/2020), or if symptoms worsen or fail to improve, for Follow up with Dr. Lamonte Sakai.   [name], NP 12/08/2019   This appointment required 32 minutes of patient care (this includes precharting, chart review, review of results, face-to-face care, etc.).

## 2019-12-08 NOTE — Patient Instructions (Addendum)
You were seen today by [name], NP  for:  1. Chronic obstructive pulmonary disease, unspecified COPD type ([place])  Stop Trelegy Ellipta  Start budesonide nebs 0.5 mg take twice daily >>>every 12 hours  >>> rinse mouth after use   Start duo nebs every 6 hours scheduled  We will see you back for close follow-up with Dr. Lamonte Sakai if your breathing is not improving with these current interventions please contact our office and we can consider starting daily prednisone   Note your daily symptoms > remember "red flags" for COPD:   >>>Increase in cough >>>increase in sputum production >>>increase in shortness of breath or activity  intolerance.   If you notice these symptoms, please call the office to be seen.   2. Lung nodule  Repeat CT chest in January/2022  Follow Up:    Return in about 6 weeks (around 01/19/2020), or if symptoms worsen or fail to improve, for Follow up with Dr. Lamonte Sakai.   Please do your part to reduce the spread of COVID-19:      Reduce your risk of any infection  and COVID19 by using the similar precautions used for avoiding the common cold or flu:  Marland Kitchen Wash your hands often with soap and warm water for at least 20 seconds.  If soap and water are not readily available, use an alcohol-based hand sanitizer with at least 60% alcohol.  . If coughing or sneezing, cover your mouth and nose by coughing or sneezing into the elbow areas of your shirt or coat, into a tissue or into your sleeve (not your hands). Langley Gauss A MASK when in public  . Avoid shaking hands with others and consider head nods or verbal greetings only. . Avoid touching your eyes, nose, or mouth with unwashed hands.  . Avoid close contact with people who are sick. . Avoid places or events with large numbers of people in one location, like concerts or sporting events. . If you have some symptoms but not all symptoms, continue to monitor at home and seek medical attention if your symptoms worsen. . If you  are having a medical emergency, call 911.   [place] / e-Visit: [email or web address]         [facility]: [facility]: [phone]                   [facility]: [phone]     It is flu season:   >>> Best ways to protect herself from the flu: Receive the yearly flu vaccine, practice good hand hygiene washing with soap and also using hand sanitizer when available, eat a nutritious meals, get adequate rest, hydrate appropriately   Please contact the office if your symptoms worsen or you have concerns that you are not improving.   Thank you for choosing  [facility] for your healthcare, and for allowing [place] to partner with you on your healthcare journey. I am thankful to be able to provide care to you today.   Wyn Quaker FNP-C

## 2019-12-08 NOTE — Telephone Encounter (Signed)
[facility] called and sig for Duoneb reviewed.

## 2019-12-08 NOTE — Assessment & Plan Note (Signed)
Discussed case with Dr. Lamonte Sakai today. High likelihood patient is not mobilizing the Ellipta device well We will stop Trelegy Ellipta Out of concern for overuse of medications we will try to simplify patient's bronchodilator regimen.  Plan: Start budesonide nebs twice daily Start DuoNeb scheduled every 6 hours Stop Trelegy Ellipta Close follow-up with our office to see Dr. Lamonte Sakai in August/2021 If symptoms or not improving despite these interventions may need to consider daily prednisone Need to consider and evaluate at future office visits referral to palliative care, patient and family were not interested today

## 2019-12-08 NOTE — Assessment & Plan Note (Signed)
Plan: We will repeat CT chest in January/2022

## 2019-12-09 DIAGNOSIS — D649 Anemia, unspecified: Secondary | ICD-10-CM | POA: Diagnosis not present

## 2019-12-09 DIAGNOSIS — J439 Emphysema, unspecified: Secondary | ICD-10-CM | POA: Diagnosis not present

## 2019-12-09 DIAGNOSIS — I350 Nonrheumatic aortic (valve) stenosis: Secondary | ICD-10-CM | POA: Diagnosis not present

## 2019-12-09 DIAGNOSIS — F419 Anxiety disorder, unspecified: Secondary | ICD-10-CM | POA: Diagnosis not present

## 2019-12-09 DIAGNOSIS — R911 Solitary pulmonary nodule: Secondary | ICD-10-CM | POA: Diagnosis not present

## 2019-12-09 DIAGNOSIS — M1990 Unspecified osteoarthritis, unspecified site: Secondary | ICD-10-CM | POA: Diagnosis not present

## 2019-12-09 DIAGNOSIS — E785 Hyperlipidemia, unspecified: Secondary | ICD-10-CM | POA: Diagnosis not present

## 2019-12-09 DIAGNOSIS — F0391 Unspecified dementia with behavioral disturbance: Secondary | ICD-10-CM | POA: Diagnosis not present

## 2019-12-09 DIAGNOSIS — F1010 Alcohol abuse, uncomplicated: Secondary | ICD-10-CM | POA: Diagnosis not present

## 2019-12-12 DIAGNOSIS — E785 Hyperlipidemia, unspecified: Secondary | ICD-10-CM | POA: Diagnosis not present

## 2019-12-12 DIAGNOSIS — D649 Anemia, unspecified: Secondary | ICD-10-CM | POA: Diagnosis not present

## 2019-12-12 DIAGNOSIS — F1010 Alcohol abuse, uncomplicated: Secondary | ICD-10-CM | POA: Diagnosis not present

## 2019-12-12 DIAGNOSIS — F0391 Unspecified dementia with behavioral disturbance: Secondary | ICD-10-CM | POA: Diagnosis not present

## 2019-12-12 DIAGNOSIS — M1990 Unspecified osteoarthritis, unspecified site: Secondary | ICD-10-CM | POA: Diagnosis not present

## 2019-12-12 DIAGNOSIS — F419 Anxiety disorder, unspecified: Secondary | ICD-10-CM | POA: Diagnosis not present

## 2019-12-12 DIAGNOSIS — I350 Nonrheumatic aortic (valve) stenosis: Secondary | ICD-10-CM | POA: Diagnosis not present

## 2019-12-12 DIAGNOSIS — J439 Emphysema, unspecified: Secondary | ICD-10-CM | POA: Diagnosis not present

## 2019-12-12 DIAGNOSIS — R911 Solitary pulmonary nodule: Secondary | ICD-10-CM | POA: Diagnosis not present

## 2019-12-14 DIAGNOSIS — F419 Anxiety disorder, unspecified: Secondary | ICD-10-CM | POA: Diagnosis not present

## 2019-12-14 DIAGNOSIS — E785 Hyperlipidemia, unspecified: Secondary | ICD-10-CM | POA: Diagnosis not present

## 2019-12-14 DIAGNOSIS — F1010 Alcohol abuse, uncomplicated: Secondary | ICD-10-CM | POA: Diagnosis not present

## 2019-12-14 DIAGNOSIS — J439 Emphysema, unspecified: Secondary | ICD-10-CM | POA: Diagnosis not present

## 2019-12-14 DIAGNOSIS — D649 Anemia, unspecified: Secondary | ICD-10-CM | POA: Diagnosis not present

## 2019-12-14 DIAGNOSIS — M1990 Unspecified osteoarthritis, unspecified site: Secondary | ICD-10-CM | POA: Diagnosis not present

## 2019-12-14 DIAGNOSIS — R911 Solitary pulmonary nodule: Secondary | ICD-10-CM | POA: Diagnosis not present

## 2019-12-14 DIAGNOSIS — I350 Nonrheumatic aortic (valve) stenosis: Secondary | ICD-10-CM | POA: Diagnosis not present

## 2019-12-14 DIAGNOSIS — F0391 Unspecified dementia with behavioral disturbance: Secondary | ICD-10-CM | POA: Diagnosis not present

## 2019-12-15 DIAGNOSIS — J439 Emphysema, unspecified: Secondary | ICD-10-CM | POA: Diagnosis not present

## 2019-12-15 DIAGNOSIS — R911 Solitary pulmonary nodule: Secondary | ICD-10-CM | POA: Diagnosis not present

## 2019-12-15 DIAGNOSIS — D649 Anemia, unspecified: Secondary | ICD-10-CM | POA: Diagnosis not present

## 2019-12-15 DIAGNOSIS — I350 Nonrheumatic aortic (valve) stenosis: Secondary | ICD-10-CM | POA: Diagnosis not present

## 2019-12-15 DIAGNOSIS — F1010 Alcohol abuse, uncomplicated: Secondary | ICD-10-CM | POA: Diagnosis not present

## 2019-12-15 DIAGNOSIS — E785 Hyperlipidemia, unspecified: Secondary | ICD-10-CM | POA: Diagnosis not present

## 2019-12-15 DIAGNOSIS — F419 Anxiety disorder, unspecified: Secondary | ICD-10-CM | POA: Diagnosis not present

## 2019-12-15 DIAGNOSIS — F0391 Unspecified dementia with behavioral disturbance: Secondary | ICD-10-CM | POA: Diagnosis not present

## 2019-12-15 DIAGNOSIS — M1990 Unspecified osteoarthritis, unspecified site: Secondary | ICD-10-CM | POA: Diagnosis not present

## 2019-12-17 DIAGNOSIS — R278 Other lack of coordination: Secondary | ICD-10-CM | POA: Diagnosis not present

## 2019-12-17 DIAGNOSIS — I350 Nonrheumatic aortic (valve) stenosis: Secondary | ICD-10-CM | POA: Diagnosis not present

## 2019-12-17 DIAGNOSIS — G9340 Encephalopathy, unspecified: Secondary | ICD-10-CM | POA: Diagnosis not present

## 2019-12-17 DIAGNOSIS — R2689 Other abnormalities of gait and mobility: Secondary | ICD-10-CM | POA: Diagnosis not present

## 2019-12-17 DIAGNOSIS — R1313 Dysphagia, pharyngeal phase: Secondary | ICD-10-CM | POA: Diagnosis not present

## 2019-12-22 DIAGNOSIS — I350 Nonrheumatic aortic (valve) stenosis: Secondary | ICD-10-CM | POA: Diagnosis not present

## 2019-12-22 DIAGNOSIS — F1010 Alcohol abuse, uncomplicated: Secondary | ICD-10-CM | POA: Diagnosis not present

## 2019-12-22 DIAGNOSIS — M1990 Unspecified osteoarthritis, unspecified site: Secondary | ICD-10-CM | POA: Diagnosis not present

## 2019-12-22 DIAGNOSIS — R911 Solitary pulmonary nodule: Secondary | ICD-10-CM | POA: Diagnosis not present

## 2019-12-22 DIAGNOSIS — F0391 Unspecified dementia with behavioral disturbance: Secondary | ICD-10-CM | POA: Diagnosis not present

## 2019-12-22 DIAGNOSIS — D649 Anemia, unspecified: Secondary | ICD-10-CM | POA: Diagnosis not present

## 2019-12-22 DIAGNOSIS — J439 Emphysema, unspecified: Secondary | ICD-10-CM | POA: Diagnosis not present

## 2019-12-22 DIAGNOSIS — E785 Hyperlipidemia, unspecified: Secondary | ICD-10-CM | POA: Diagnosis not present

## 2019-12-22 DIAGNOSIS — F419 Anxiety disorder, unspecified: Secondary | ICD-10-CM | POA: Diagnosis not present

## 2019-12-23 DIAGNOSIS — I350 Nonrheumatic aortic (valve) stenosis: Secondary | ICD-10-CM | POA: Diagnosis not present

## 2019-12-23 DIAGNOSIS — F0391 Unspecified dementia with behavioral disturbance: Secondary | ICD-10-CM | POA: Diagnosis not present

## 2019-12-23 DIAGNOSIS — D649 Anemia, unspecified: Secondary | ICD-10-CM | POA: Diagnosis not present

## 2019-12-23 DIAGNOSIS — J439 Emphysema, unspecified: Secondary | ICD-10-CM | POA: Diagnosis not present

## 2019-12-23 DIAGNOSIS — R911 Solitary pulmonary nodule: Secondary | ICD-10-CM | POA: Diagnosis not present

## 2019-12-23 DIAGNOSIS — E785 Hyperlipidemia, unspecified: Secondary | ICD-10-CM | POA: Diagnosis not present

## 2019-12-23 DIAGNOSIS — M1990 Unspecified osteoarthritis, unspecified site: Secondary | ICD-10-CM | POA: Diagnosis not present

## 2019-12-23 DIAGNOSIS — F1010 Alcohol abuse, uncomplicated: Secondary | ICD-10-CM | POA: Diagnosis not present

## 2019-12-23 DIAGNOSIS — F419 Anxiety disorder, unspecified: Secondary | ICD-10-CM | POA: Diagnosis not present

## 2019-12-28 DIAGNOSIS — F1010 Alcohol abuse, uncomplicated: Secondary | ICD-10-CM | POA: Diagnosis not present

## 2019-12-28 DIAGNOSIS — E785 Hyperlipidemia, unspecified: Secondary | ICD-10-CM | POA: Diagnosis not present

## 2019-12-28 DIAGNOSIS — I350 Nonrheumatic aortic (valve) stenosis: Secondary | ICD-10-CM | POA: Diagnosis not present

## 2019-12-28 DIAGNOSIS — J439 Emphysema, unspecified: Secondary | ICD-10-CM | POA: Diagnosis not present

## 2019-12-28 DIAGNOSIS — M1990 Unspecified osteoarthritis, unspecified site: Secondary | ICD-10-CM | POA: Diagnosis not present

## 2019-12-28 DIAGNOSIS — D649 Anemia, unspecified: Secondary | ICD-10-CM | POA: Diagnosis not present

## 2019-12-28 DIAGNOSIS — R911 Solitary pulmonary nodule: Secondary | ICD-10-CM | POA: Diagnosis not present

## 2019-12-28 DIAGNOSIS — F419 Anxiety disorder, unspecified: Secondary | ICD-10-CM | POA: Diagnosis not present

## 2019-12-28 DIAGNOSIS — F0391 Unspecified dementia with behavioral disturbance: Secondary | ICD-10-CM | POA: Diagnosis not present

## 2019-12-30 DIAGNOSIS — E785 Hyperlipidemia, unspecified: Secondary | ICD-10-CM | POA: Diagnosis not present

## 2019-12-30 DIAGNOSIS — F419 Anxiety disorder, unspecified: Secondary | ICD-10-CM | POA: Diagnosis not present

## 2019-12-30 DIAGNOSIS — J439 Emphysema, unspecified: Secondary | ICD-10-CM | POA: Diagnosis not present

## 2019-12-30 DIAGNOSIS — M1990 Unspecified osteoarthritis, unspecified site: Secondary | ICD-10-CM | POA: Diagnosis not present

## 2019-12-30 DIAGNOSIS — D649 Anemia, unspecified: Secondary | ICD-10-CM | POA: Diagnosis not present

## 2019-12-30 DIAGNOSIS — I350 Nonrheumatic aortic (valve) stenosis: Secondary | ICD-10-CM | POA: Diagnosis not present

## 2019-12-30 DIAGNOSIS — R911 Solitary pulmonary nodule: Secondary | ICD-10-CM | POA: Diagnosis not present

## 2019-12-30 DIAGNOSIS — F1010 Alcohol abuse, uncomplicated: Secondary | ICD-10-CM | POA: Diagnosis not present

## 2019-12-30 DIAGNOSIS — F0391 Unspecified dementia with behavioral disturbance: Secondary | ICD-10-CM | POA: Diagnosis not present

## 2020-01-03 ENCOUNTER — Telehealth: Payer: Self-pay | Admitting: Internal Medicine

## 2020-01-03 NOTE — Telephone Encounter (Signed)
Faxed orders to [facility] PT [phone] to Continue PT program

## 2020-01-05 DIAGNOSIS — M1990 Unspecified osteoarthritis, unspecified site: Secondary | ICD-10-CM | POA: Diagnosis not present

## 2020-01-05 DIAGNOSIS — F0391 Unspecified dementia with behavioral disturbance: Secondary | ICD-10-CM | POA: Diagnosis not present

## 2020-01-05 DIAGNOSIS — F419 Anxiety disorder, unspecified: Secondary | ICD-10-CM | POA: Diagnosis not present

## 2020-01-05 DIAGNOSIS — E785 Hyperlipidemia, unspecified: Secondary | ICD-10-CM | POA: Diagnosis not present

## 2020-01-05 DIAGNOSIS — I350 Nonrheumatic aortic (valve) stenosis: Secondary | ICD-10-CM | POA: Diagnosis not present

## 2020-01-05 DIAGNOSIS — R911 Solitary pulmonary nodule: Secondary | ICD-10-CM | POA: Diagnosis not present

## 2020-01-05 DIAGNOSIS — D649 Anemia, unspecified: Secondary | ICD-10-CM | POA: Diagnosis not present

## 2020-01-05 DIAGNOSIS — F1010 Alcohol abuse, uncomplicated: Secondary | ICD-10-CM | POA: Diagnosis not present

## 2020-01-05 DIAGNOSIS — J439 Emphysema, unspecified: Secondary | ICD-10-CM | POA: Diagnosis not present

## 2020-01-07 NOTE — Patient Instructions (Signed)
It was a pleasure to see you today.  Your prealbumin level has improved.  Please try to eat as well as possible.  Continue home PT.  Return in 6 months or as needed.

## 2020-01-10 DIAGNOSIS — F1010 Alcohol abuse, uncomplicated: Secondary | ICD-10-CM | POA: Diagnosis not present

## 2020-01-10 DIAGNOSIS — D649 Anemia, unspecified: Secondary | ICD-10-CM | POA: Diagnosis not present

## 2020-01-10 DIAGNOSIS — J439 Emphysema, unspecified: Secondary | ICD-10-CM | POA: Diagnosis not present

## 2020-01-10 DIAGNOSIS — I350 Nonrheumatic aortic (valve) stenosis: Secondary | ICD-10-CM | POA: Diagnosis not present

## 2020-01-10 DIAGNOSIS — R911 Solitary pulmonary nodule: Secondary | ICD-10-CM | POA: Diagnosis not present

## 2020-01-10 DIAGNOSIS — E785 Hyperlipidemia, unspecified: Secondary | ICD-10-CM | POA: Diagnosis not present

## 2020-01-10 DIAGNOSIS — M1990 Unspecified osteoarthritis, unspecified site: Secondary | ICD-10-CM | POA: Diagnosis not present

## 2020-01-10 DIAGNOSIS — F0391 Unspecified dementia with behavioral disturbance: Secondary | ICD-10-CM | POA: Diagnosis not present

## 2020-01-10 DIAGNOSIS — F419 Anxiety disorder, unspecified: Secondary | ICD-10-CM | POA: Diagnosis not present

## 2020-01-17 DIAGNOSIS — G9340 Encephalopathy, unspecified: Secondary | ICD-10-CM | POA: Diagnosis not present

## 2020-01-17 DIAGNOSIS — I350 Nonrheumatic aortic (valve) stenosis: Secondary | ICD-10-CM | POA: Diagnosis not present

## 2020-01-17 DIAGNOSIS — R278 Other lack of coordination: Secondary | ICD-10-CM | POA: Diagnosis not present

## 2020-01-17 DIAGNOSIS — R2689 Other abnormalities of gait and mobility: Secondary | ICD-10-CM | POA: Diagnosis not present

## 2020-01-17 DIAGNOSIS — R1313 Dysphagia, pharyngeal phase: Secondary | ICD-10-CM | POA: Diagnosis not present

## 2020-01-20 DIAGNOSIS — M1990 Unspecified osteoarthritis, unspecified site: Secondary | ICD-10-CM | POA: Diagnosis not present

## 2020-01-20 DIAGNOSIS — F1010 Alcohol abuse, uncomplicated: Secondary | ICD-10-CM | POA: Diagnosis not present

## 2020-01-20 DIAGNOSIS — E785 Hyperlipidemia, unspecified: Secondary | ICD-10-CM | POA: Diagnosis not present

## 2020-01-20 DIAGNOSIS — J439 Emphysema, unspecified: Secondary | ICD-10-CM | POA: Diagnosis not present

## 2020-01-20 DIAGNOSIS — D649 Anemia, unspecified: Secondary | ICD-10-CM | POA: Diagnosis not present

## 2020-01-20 DIAGNOSIS — R911 Solitary pulmonary nodule: Secondary | ICD-10-CM | POA: Diagnosis not present

## 2020-01-20 DIAGNOSIS — F0391 Unspecified dementia with behavioral disturbance: Secondary | ICD-10-CM | POA: Diagnosis not present

## 2020-01-20 DIAGNOSIS — F419 Anxiety disorder, unspecified: Secondary | ICD-10-CM | POA: Diagnosis not present

## 2020-01-20 DIAGNOSIS — I350 Nonrheumatic aortic (valve) stenosis: Secondary | ICD-10-CM | POA: Diagnosis not present

## 2020-01-22 DIAGNOSIS — F0391 Unspecified dementia with behavioral disturbance: Secondary | ICD-10-CM | POA: Diagnosis not present

## 2020-01-22 DIAGNOSIS — J439 Emphysema, unspecified: Secondary | ICD-10-CM | POA: Diagnosis not present

## 2020-01-22 DIAGNOSIS — D649 Anemia, unspecified: Secondary | ICD-10-CM | POA: Diagnosis not present

## 2020-01-22 DIAGNOSIS — G8929 Other chronic pain: Secondary | ICD-10-CM | POA: Diagnosis not present

## 2020-01-22 DIAGNOSIS — M549 Dorsalgia, unspecified: Secondary | ICD-10-CM | POA: Diagnosis not present

## 2020-01-22 DIAGNOSIS — E785 Hyperlipidemia, unspecified: Secondary | ICD-10-CM | POA: Diagnosis not present

## 2020-01-22 DIAGNOSIS — F419 Anxiety disorder, unspecified: Secondary | ICD-10-CM | POA: Diagnosis not present

## 2020-01-22 DIAGNOSIS — I350 Nonrheumatic aortic (valve) stenosis: Secondary | ICD-10-CM | POA: Diagnosis not present

## 2020-01-22 DIAGNOSIS — R911 Solitary pulmonary nodule: Secondary | ICD-10-CM | POA: Diagnosis not present

## 2020-01-25 ENCOUNTER — Telehealth: Payer: Self-pay | Admitting: Internal Medicine

## 2020-01-25 NOTE — Telephone Encounter (Signed)
Katherine [name] [phone]  Katherine Bouche dropped by the office and left card with his phone number on it for you to call him, stating that Katherine [name] has taken a turn for the worse. She is not eating, going up and down stairs by herself, just not doing good. He does not know what to do, he would like advise from you. Does he need to look for some where for her to stay like a facility?

## 2020-01-26 ENCOUNTER — Encounter: Payer: Self-pay | Admitting: Emergency Medicine

## 2020-01-26 ENCOUNTER — Ambulatory Visit: Payer: Medicare PPO | Admitting: Emergency Medicine

## 2020-01-26 ENCOUNTER — Other Ambulatory Visit: Payer: Self-pay

## 2020-01-26 ENCOUNTER — Telehealth: Payer: Self-pay | Admitting: Internal Medicine

## 2020-01-26 DIAGNOSIS — R059 Cough, unspecified: Secondary | ICD-10-CM | POA: Insufficient documentation

## 2020-01-26 DIAGNOSIS — R05 Cough: Secondary | ICD-10-CM

## 2020-01-26 DIAGNOSIS — J449 Chronic obstructive pulmonary disease, unspecified: Secondary | ICD-10-CM

## 2020-01-26 MED ORDER — ALBUTEROL SULFATE HFA 108 (90 BASE) MCG/ACT IN AERS: 2.0000 | INHALATION_SPRAY | Freq: Four times a day (QID) | RESPIRATORY_TRACT | 11 refills | Status: DC | PRN

## 2020-01-26 NOTE — Patient Instructions (Addendum)
Start taking your pepcid every evening for a few weeks to see if this helps with reflux, cough and breathing Usually nebulizers as follows: -Albuterol/ipratropium (DuoNeb) nebulizer treatment 4 times a day on a schedule -Budesonide (Pulmicort) nebulizer treatment 2 times a day on a schedule (near your first and final DuoNeb treatments of the day) Walking oximetry today on room air Follow with Dr. Lamonte Sakai next available to review your status

## 2020-01-26 NOTE — Assessment & Plan Note (Signed)
Attempted to clarify her nebulizer regimen with her today.  Unclear as to whether she was using Atrovent alone but I would like for her to be on DuoNeb on a schedule.  We will drop the Atrovent, continue DuoNeb 4 times daily, continue budesonide twice daily, albuterol inhaler as needed.  Depending on progress we may ultimately decide that she needs to be back on low-dose prednisone.  Walking oximetry today to ensure that she does not have occult desaturation.

## 2020-01-26 NOTE — Assessment & Plan Note (Signed)
With associated hoarseness.  Sounds like principally upper airway disease although she does make a small amount of sputum, could be related to her COPD.  She has GERD and we should treat more aggressively.  She uses Pepcid as needed, I asked her to take it reliably each evening for the next 2 to 3 weeks to see if this helps

## 2020-01-26 NOTE — Telephone Encounter (Signed)
Husband came by yesterday to speak with me. I was out of the office yesterday afternoon. He told staff patient was not eating and not doing well. He wanted to speak with me. He was wondering about facility placement. We did an application for [facility] not long ago but patient was not willing to go. Appetite then had improved.Have scheduled appt to see him tomorrow to discuss. She cannot tolerate Megace as an appetite stimulant.

## 2020-01-26 NOTE — Telephone Encounter (Signed)
Scheduled

## 2020-01-26 NOTE — Progress Notes (Signed)
   Subjective:    Patient ID: Katherine [name], female    DOB: [birth year], 86 y.o.   MRN: [id number]  HPI  ROV 01/26/20 --this is a follow-up visit for 86 year old woman with severe COPD, history of a positive PPD, upper airway irritation syndrome and chronic cough.  I last saw her July 2020 but she has been seen multiple times in the interim in our office by other providers.  She has been on chronic prednisone in the past but not currently.  She had been on Trelegy but we are concerned about adequacy of delivery so we changed her to scheduled DuoNeb and budesonide. She has dyspnea with almost any activity, uses albuterol HFA as needed. Some increased cough over the last month, minimally productive. She has heartburn at night. She uses pepcid as needed, most nights.  She started a dehumidifier at home that has helped her.     06/22/18:  FEV1 0.94 (66) FVC 1.72 (89) Ratio 54% RV 131% TLC 106% DLCO/VA 40 % pred   Review of Systems  Constitutional: Positive for activity change, fatigue and unexpected weight change.  HENT: Positive for postnasal drip and voice change.   Eyes: Negative.   Respiratory: Positive for shortness of breath.   Cardiovascular: Negative.   Gastrointestinal: Negative.   Endocrine: Negative.   Genitourinary: Negative.   Musculoskeletal: Positive for back pain and joint swelling.  Skin: Negative.   Allergic/Immunologic: Negative.   Neurological: Positive for weakness.  Hematological: Negative.   Psychiatric/Behavioral: Negative.          Objective:   Physical Exam Vitals:   01/26/20 1205  BP: 106/60  Pulse: (!) 103  Temp: 97.7 F (36.5 C)  TempSrc: Temporal  SpO2: 97%  Weight: 88 lb 9.6 oz (40.2 kg)  Height: 5' 1.5" (1.562 m)   Gen: Pleasant, thin elderly woman, in no distress,  normal affect  ENT: No lesions,  mouth clear,  oropharynx clear, no postnasal drip  Neck: No JVD, no stridor  Lungs: No use of accessory muscles, distant, no  wheeze on forced exp  Cardiovascular: RRR, holosystolic murmur  Musculoskeletal: No deformities, no cyanosis or clubbing  Neuro: alert, awake, non focal  Skin: Warm, no lesions or rash      Assessment & Plan:  Cough With associated hoarseness.  Sounds like principally upper airway disease although she does make a small amount of sputum, could be related to her COPD.  She has GERD and we should treat more aggressively.  She uses Pepcid as needed, I asked her to take it reliably each evening for the next 2 to 3 weeks to see if this helps  COPD (chronic obstructive pulmonary disease) Attempted to clarify her nebulizer regimen with her today.  Unclear as to whether she was using Atrovent alone but I would like for her to be on DuoNeb on a schedule.  We will drop the Atrovent, continue DuoNeb 4 times daily, continue budesonide twice daily, albuterol inhaler as needed.  Depending on progress we may ultimately decide that she needs to be back on low-dose prednisone.  Walking oximetry today to ensure that she does not have occult desaturation.  Baltazar Apo, MD, PhD 01/26/2020, 12:42 PM [facility] [phone] or if no answer [phone]

## 2020-01-26 NOTE — Telephone Encounter (Signed)
[place] orders to [facility] [phone] , phone [phone] to continue PT program

## 2020-01-26 NOTE — Telephone Encounter (Signed)
Can he come by today to discuss?

## 2020-01-27 ENCOUNTER — Ambulatory Visit (INDEPENDENT_AMBULATORY_CARE_PROVIDER_SITE_OTHER): Payer: Medicare PPO | Admitting: Internal Medicine

## 2020-01-27 ENCOUNTER — Encounter: Payer: Self-pay | Admitting: Internal Medicine

## 2020-01-27 ENCOUNTER — Other Ambulatory Visit: Payer: Self-pay

## 2020-01-27 DIAGNOSIS — E441 Mild protein-calorie malnutrition: Secondary | ICD-10-CM | POA: Diagnosis not present

## 2020-01-27 DIAGNOSIS — Z710 Person encountering health services to consult on behalf of another person: Secondary | ICD-10-CM | POA: Diagnosis not present

## 2020-01-27 MED ORDER — PREDNISONE 5 MG PO TABS
5.0000 mg | ORAL_TABLET | Freq: Every day | ORAL | 0 refills | Status: DC
Start: 2020-01-27 — End: 2020-02-24

## 2020-01-27 NOTE — Patient Instructions (Signed)
Family conference with husband in person today.  Starting prednisone 5 mg daily for appetite stimulus and COPD.  Patient will be seen here on August 27.  Palliative care consultation requested.

## 2020-01-27 NOTE — Progress Notes (Signed)
   Subjective:    Patient ID: Katherine [name], female    DOB: 01/26/1929, 86 y.o.   MRN: [id number]  HPI Husband presents for discussion today of patient's current condition at my request after he came by the office Wednesday asking to speak with me by telephone.  He is clearly frustrated at this point time and does not know how to proceed.  Patient is basically eating 1 meal a day and sometimes has some snacks but not eating a lot.  He says she weighs 88 pounds.  She saw Dr. Lamonte Sakai yesterday regarding COPD management.  Dr. Anson [place] mentions in his notes possibility of starting her on prednisone at next visit if she is not improving.  With Dr. Lamonte Sakai saw her yesterday, but she was not in heart failure.  She does have a holosystolic murmur that is chronic.  Dr. Lamonte Sakai noted she was not using accessory muscles and had no wheezing on forced expiration.  He recommended that she be on DuoNeb on a regular schedule.  Atrovent was discontinued.  Was to continue with budesonide twice daily and albuterol inhaler as needed.  After discussion today with husband, I have started patient on prednisone 5 mg daily pending follow-up with Dr. Lamonte Sakai.    Review of Systems see above     Objective:   Physical Exam Patient not present today-this is a family conference with her husband       Assessment & Plan:  Discussion with husband centering around need for consideration of palliative care services.  Apparently home health nurses have discontinued their services.  He is paying his wife's granddaughter to help out 3/2 days a week while he works at [employer] where he is a Social worker.  Patient has a stair lift that she has agreed not to use at night while he is asleep but recently she used it once and was found downstairs in the kitchen.  He says she is having some memory loss issues which are concerning to him.  Physical therapy was coming to the home but she was not doing exercises after they leave.  She was  unable to tolerate Megace as an appetite stimulant because of nausea.  Total of 25 minutes spent with husband and family conference.  He requested I see patient in a couple of weeks.  We have agreed that she will start prednisone 5 mg daily.  Have made appointment for patient to be seen here August 27.  Palliative care consultation requested.

## 2020-01-28 ENCOUNTER — Other Ambulatory Visit: Payer: Self-pay | Admitting: Internal Medicine

## 2020-01-28 NOTE — Telephone Encounter (Signed)
Can you call Mr. Katherine [name] to see if this is needed?

## 2020-01-30 DIAGNOSIS — M549 Dorsalgia, unspecified: Secondary | ICD-10-CM | POA: Diagnosis not present

## 2020-01-30 DIAGNOSIS — G8929 Other chronic pain: Secondary | ICD-10-CM | POA: Diagnosis not present

## 2020-02-01 ENCOUNTER — Telehealth: Payer: Self-pay | Admitting: Internal Medicine

## 2020-02-01 NOTE — Telephone Encounter (Signed)
Blase Mess, RN - [place] [phone] Fax [phone]  Called and spoke with Burman Nieves to confirm why they were not going out for nursing. She stated they had stop back on 12/16/19 because there was not a reason under insurance guideline for nursing to go out. If patient started new medication, had wound or needed any kind of dressing changes or education they can go out for those things. She stated they could go back out and re - evaluate and see if there was new reason to start going back out. I also talk with her about patients husband concerns about her not being able to continue with PT at the level she was doing it because of her breating issue at this time. She let me know that the could modify her PT to adjust to the patients needs.   I called and spoke with [name] (patient spouse) and explained the reasoning in nursing not coming out anymore. He verbalized understanding, and said that he did not feel like they need nursing at this time that PT personal could take care of anything they needed. I also explained to him about PT and how it works that we can not have it for a few weeks put it off for a few weeks and then start it back. That we have to be consistent. That also when patient is not having PT that she is going to regress. I let him know that they can modify her PT to what she is able to do according to her breathing and capability. He verbalized understanding and was very agreeable to them coming out according to the all ready set schedule.  Once I hung up from [place] I then called Maggie back at [facility] and let her know that [name] was onboard with PT coming out as scheduled, however he does not want nursing at this time, he stated that PT could notify Dr Renold Genta and [facility] if there was a need for nursing to start back.

## 2020-02-01 NOTE — Telephone Encounter (Signed)
Called and spoke with [name], he would like to get this refilled for patient, he likes to keep if on hand, she does not take it often, but it does help at times when she gets anxious.

## 2020-02-02 NOTE — Telephone Encounter (Signed)
OK -so as I read this Mr. Katherine [name] does not want nursing but agreeable to PT. Many thanks for clarifying this confusing situation!

## 2020-02-02 NOTE — Telephone Encounter (Addendum)
Faxed signed orders to re certify PT orders for  01/22/2020 to 03/21/2020  [facility] [phone] Phone [phone]

## 2020-02-06 ENCOUNTER — Telehealth: Payer: Self-pay | Admitting: Nurse Practitioner

## 2020-02-06 NOTE — Telephone Encounter (Signed)
Spoke with patient's husband, [name], regarding Palliative referral and he was in agreement with scheduling visit.  I have scheduled an In-person Consult for 02/13/20 @ 1 PM.

## 2020-02-07 DIAGNOSIS — D649 Anemia, unspecified: Secondary | ICD-10-CM | POA: Diagnosis not present

## 2020-02-07 DIAGNOSIS — F419 Anxiety disorder, unspecified: Secondary | ICD-10-CM | POA: Diagnosis not present

## 2020-02-07 DIAGNOSIS — M549 Dorsalgia, unspecified: Secondary | ICD-10-CM | POA: Diagnosis not present

## 2020-02-07 DIAGNOSIS — F0391 Unspecified dementia with behavioral disturbance: Secondary | ICD-10-CM | POA: Diagnosis not present

## 2020-02-07 DIAGNOSIS — E785 Hyperlipidemia, unspecified: Secondary | ICD-10-CM | POA: Diagnosis not present

## 2020-02-07 DIAGNOSIS — R911 Solitary pulmonary nodule: Secondary | ICD-10-CM | POA: Diagnosis not present

## 2020-02-07 DIAGNOSIS — G8929 Other chronic pain: Secondary | ICD-10-CM | POA: Diagnosis not present

## 2020-02-07 DIAGNOSIS — J439 Emphysema, unspecified: Secondary | ICD-10-CM | POA: Diagnosis not present

## 2020-02-07 DIAGNOSIS — I350 Nonrheumatic aortic (valve) stenosis: Secondary | ICD-10-CM | POA: Diagnosis not present

## 2020-02-10 ENCOUNTER — Encounter: Payer: Self-pay | Admitting: Internal Medicine

## 2020-02-10 ENCOUNTER — Other Ambulatory Visit: Payer: Self-pay

## 2020-02-10 ENCOUNTER — Ambulatory Visit: Payer: Medicare PPO | Admitting: Internal Medicine

## 2020-02-10 VITALS — BP 110/70 | HR 102 | Ht 61.5 in | Wt 92.0 lb

## 2020-02-10 DIAGNOSIS — F329 Major depressive disorder, single episode, unspecified: Secondary | ICD-10-CM

## 2020-02-10 DIAGNOSIS — R54 Age-related physical debility: Secondary | ICD-10-CM

## 2020-02-10 DIAGNOSIS — I700 Atherosclerosis of aorta: Secondary | ICD-10-CM

## 2020-02-10 DIAGNOSIS — S32010A Wedge compression fracture of first lumbar vertebra, initial encounter for closed fracture: Secondary | ICD-10-CM

## 2020-02-10 DIAGNOSIS — I350 Nonrheumatic aortic (valve) stenosis: Secondary | ICD-10-CM | POA: Diagnosis not present

## 2020-02-10 DIAGNOSIS — Z8711 Personal history of peptic ulcer disease: Secondary | ICD-10-CM | POA: Diagnosis not present

## 2020-02-10 DIAGNOSIS — R627 Adult failure to thrive: Secondary | ICD-10-CM

## 2020-02-10 DIAGNOSIS — R630 Anorexia: Secondary | ICD-10-CM | POA: Diagnosis not present

## 2020-02-10 DIAGNOSIS — Z87891 Personal history of nicotine dependence: Secondary | ICD-10-CM | POA: Diagnosis not present

## 2020-02-10 DIAGNOSIS — R110 Nausea: Secondary | ICD-10-CM

## 2020-02-10 DIAGNOSIS — M549 Dorsalgia, unspecified: Secondary | ICD-10-CM

## 2020-02-10 DIAGNOSIS — F419 Anxiety disorder, unspecified: Secondary | ICD-10-CM

## 2020-02-10 DIAGNOSIS — J449 Chronic obstructive pulmonary disease, unspecified: Secondary | ICD-10-CM

## 2020-02-10 DIAGNOSIS — R531 Weakness: Secondary | ICD-10-CM | POA: Diagnosis not present

## 2020-02-10 DIAGNOSIS — Z9889 Other specified postprocedural states: Secondary | ICD-10-CM

## 2020-02-10 DIAGNOSIS — G8929 Other chronic pain: Secondary | ICD-10-CM

## 2020-02-10 MED ORDER — ESCITALOPRAM OXALATE 5 MG PO TABS
5.0000 mg | ORAL_TABLET | Freq: Every day | ORAL | 5 refills | Status: DC
Start: 2020-02-10 — End: 2020-03-03

## 2020-02-13 ENCOUNTER — Telehealth: Payer: Self-pay | Admitting: Internal Medicine

## 2020-02-13 ENCOUNTER — Other Ambulatory Visit: Payer: Self-pay

## 2020-02-13 ENCOUNTER — Other Ambulatory Visit: Payer: Medicare PPO | Admitting: Nurse Practitioner

## 2020-02-13 ENCOUNTER — Encounter: Payer: Self-pay | Admitting: Nurse Practitioner

## 2020-02-13 DIAGNOSIS — J449 Chronic obstructive pulmonary disease, unspecified: Secondary | ICD-10-CM

## 2020-02-13 DIAGNOSIS — Z515 Encounter for palliative care: Secondary | ICD-10-CM | POA: Diagnosis not present

## 2020-02-13 NOTE — Progress Notes (Addendum)
[facility] Consult Note Telephone: [phone]  Fax: [phone]  PATIENT NAME: Katherine [name] Katherine [name] [id number] [phone] (home)  DOB: 1929-01-28 MRN: [id number]  PRIMARY CARE PROVIDER:    Elby Showers, MD,  [address] [place] [address] [phone]  REFERRING PROVIDER:   Elby Showers, MD [address] [place],  [place] [address] [phone]  RESPONSIBLE PARTY:   Extended Emergency Contact Information Primary Emergency Contact: Stetzel,Katherine [name] Address: [address]          [place], [place] [address] Johnnette Litter of [place] Phone: [phone] Mobile Phone: [phone] Relation: Spouse Secondary Emergency Contact: Katherine [name] Mobile Phone: [phone] Relation: Granddaughter  I met face to face with patient and her husband in their home.  ASSESSMENT AND RECOMMENDATIONS:   1. Advance Care Planning/Goals of Care: Goals include to maximize quality of life and symptom management. Our advance care planning conversation included a discussion about:     The value and importance of advance care planning   Experiences with loved ones who have been seriously ill or have died   Exploration of personal, cultural or spiritual beliefs that might influence medical decisions   Exploration of goals of care in the event of a sudden injury or illness   Review and updating or creation of an  advance directive document  Discussed with patient and her husband about what Palliative care services entails and how it differs from Hospice care. Patient had a good understanding of Hospice care services as she has family/friends who has used the services. Patient verbalized desire to live a functional life, patient want to be able to complete her ADLs with minimal assist and if possilbe be able to go on mild grocery run, saying " I want to do more for myself with confidence". Patient  however does not want to be a burden to her family in the event of critical illness or acute decline. Patient verbalized desire to not be resuscitated in the event of cardiac or respiratory arrest. A DNR form was completed with patient today, copy uploaded to [facility]. Information about MOST form and a blank copy of MOST form was left with patient and husband to review, plan to discuss at next visit or when ready.  2. Symptom Management:  COPD: Patient is a former smoker. Patient is followed by Pulmonologist Dr. Lamonte Sakai, last visit was 12/08/2019. Patient PCP started her on Prednisone 35m daily at her last visit on 01/27/2020, currently on Pulmicort BID and Albuterol neb 4 times daily. Patient endorsed improvement in breathing since starting Prednisone. Last treated for respiratory infection 2 months ago. Patient report having an acute episode of dyspnea yesterday, symptom improved with doses of Nebs. Patient on room air, oxygen saturation 94% at this time. Patient up to date with her immunization including covid-19 vaccination. Discussed need to monitor for evidence of worsening respiratory decline, such as cough, increase sputum production or wheezing and need to avoid irritants.   Insomnia: Patient report increased insomnia, report inability to stay asleep. She report sleeping less than 5 hours at night. She is currently on Trazodone 1515mat bedtime. Discussed good bedtime routines. Recommended taking Melatonin 62m10mn the evenings if her PCP okays it.  Weight loss: Patient experiencing weight loss related to poor appetite. Patient reporting loosing 20lbs in the last 6 months, report intolerance to Megace ( made her nauseous). I will discuss with Dr. BaxRenold Gentaatient may benefit from low dose Remeron  for appetite stimulation. Patient report taking oral nutritional supplements to augment her intake.  3. Follow up Palliative Care Visit: Palliative care will continue to follow for goals of care clarification and  symptom management. Next visit scheduled for 03/12/2020, patient/husband aware to call if visit needed sooner.  4. Family /Caregiver/Community Supports: Patient husband is her main care giver, they've been married for 66yr. Home health with [facility] was discontinued, services not needed at this time per patient. Patient's grand daugther lives close by and assist in care for patient. Patient has two sons who lives out of town.  5. Cognitive / Functional decline: Patient report some mild memory issues. She however provided most history at this visit. She report not having much stamina, was receiving physical therapy. PT was put on hold due to increased dyspnea with activity.  I spent 60 minutes providing this consultation, from 1pm to 2pm. More than 50% of the time in this consultation was spent coordinating communication.   CHIEF COMPLAINT: Initial Palliative care consultation  HISTORY OF PRESENT ILLNESS:  Katherine [name] is a 86y.o. year old female with multiple medical problems including  COPD, chronic back pain related to L1 compression fracture, aortic stenosis with mild regurgitation, anxiety/depression. Patient was recently diagnosed with failure to thrive by her PCP. Palliative Care was asked to follow this patient by consultation request of [place], MCresenciano Lick MD to help address advance care planning and goals of care.  CODE STATUS: DNR  PPS: 50%  HOSPICE ELIGIBILITY/DIAGNOSIS: TBD  PAST MEDICAL HISTORY:  Past Medical History:  Diagnosis Date  . Alcoholic ([facility]    36 years sober  . Allergy   . Anemia   . Arthritis   . Asthma    patient denies  . B12 deficiency   . COPD (chronic obstructive pulmonary disease) ([facility]   . Depression    not recent  . Diverticulosis   . Esophageal stricture   . Headache    cluster headaches occasionally  . Hepatic cyst 01/30/10  . Hiatal hernia 1985   patient unaware  . Hx of adenomatous colonic polyps 2006  .  Hyperlipidemia   . IBS (irritable bowel syndrome)   . PONV (postoperative nausea and vomiting)    no nausea or vomitting after surgery 07/2014  . Positive PPD   . PUD (peptic ulcer disease)   . Shortness of breath dyspnea    SOB WITH EXERTION   (NOT NEW)    SOCIAL HX:  Social History   Tobacco Use  . Smoking status: Former Smoker    Years: 60.00    Types: Cigarettes    Quit date: 02/02/2010    Years since quitting: 10.0  . Smokeless tobacco: Never Used  Substance Use Topics  . Alcohol use: No    Comment: recovering alcoholic     35 YRS     FAMILY HX:  Family History  Problem Relation Age of Onset  . Ulcers Father   . Stroke Father   . Irritable bowel syndrome Father   . Ovarian cancer Sister   . Colon cancer Neg Hx     ALLERGIES: No Known Allergies   PERTINENT MEDICATIONS:  Outpatient Encounter Medications as of 02/13/2020  Medication Sig  . albuterol (PROVENTIL) (2.5 MG/3ML) 0.083% nebulizer solution 1 vial in neb every 4-6 hours as needed  Dx: J44.9 (Patient not taking: Reported on 01/26/2020)  . albuterol (VENTOLIN HFA) 108 (90 Base) MCG/ACT inhaler Inhale 2 puffs into the lungs 4 (  four) times daily as needed for wheezing or shortness of breath. INHALE 2 PUFFS INTO THE LUNGS 4 TIMES A DAY AS NEEDED  . ALPRAZolam (XANAX) 0.25 MG tablet TAKE 1 TABLET (0.25 MG TOTAL) BY MOUTH 2 (TWO) TIMES DAILY AS NEEDED FOR ANXIETY.  . budesonide (PULMICORT) 0.5 MG/2ML nebulizer solution Take 2 mLs (0.5 mg total) by nebulization 2 (two) times daily.  Marland Kitchen escitalopram (LEXAPRO) 5 MG tablet Take 1 tablet (5 mg total) by mouth daily.  Marland Kitchen ipratropium (ATROVENT) 0.02 % nebulizer solution Take 2.5 mLs (0.5 mg total) by nebulization 4 (four) times daily.  Marland Kitchen ipratropium-albuterol (DUONEB) 0.5-2.5 (3) MG/3ML SOLN Take 3 mLs by nebulization every 6 (six) hours.  . predniSONE (DELTASONE) 5 MG tablet Take 1 tablet (5 mg total) by mouth daily with breakfast.  . traMADol (ULTRAM) 50 MG tablet Take 50 mg by  mouth every 6 (six) hours as needed for moderate pain.   . traZODone (DESYREL) 50 MG tablet TAKE 3 TABLETS BY MOUTH AT BEDTIME (Patient taking differently: Take 150 mg by mouth at bedtime as needed for sleep. )   No facility-administered encounter medications on file as of 02/13/2020.    PHYSICAL EXAM / ROS:   Current and past weights: 92lbs, BMI 17.1 on 01/27/20 General: NAD, frail appearing, thin. Cooperative and coherent Cardiovascular: no chest pain reported, no edema  Pulmonary: no cough, no increased SOB, room air Abdomen: appetite poor, denied constipation, continent of bowel GU: denies dysuria, continent of urine MSK:  no joint and ROM abnormalities, ambulatory Skin: no rashes on exposed skin. Skin tear on anterior left lower leg and right arm. Skin thin and fragile Neurological: weakness, but otherwise nonfocal  Jari Favre, DNP, AGPCNP-BC  Addendum Spoke with Dr. Renold Genta. She is okay with patient taking Melatonin 71m, She does not want to start patient on Remeron due to possible interaction with Trazodone. Dr. BRenold Genta said patient dose of Trazodone was recently increased to 1523mand that patient has been on Trazodone for a long time. She want patient to be monitored on the current dose, expressed appreciation for Palliative care visit.

## 2020-02-13 NOTE — Telephone Encounter (Signed)
There seems to be too many possible drug interactions with Trazadone , Lexapro, and Remeron. Let's try melatonin first and see how things go. We would have to stop Trazadone if we are going to try Remeron. Sometimes Remeron is expensive depending on insurance plan. I spoke with Loyal Gambler directly today. MJB,MD

## 2020-02-13 NOTE — Telephone Encounter (Signed)
Jari Favre, NP - [facility]  Katherine [name] called to recommend patient may try melatonin 3 mg 1 in the evening along with her regular medication for insomnia. She also stated that she had good response with her patients  with Remeron 7.5 mg at bedtime for appetite, if you are okay with these recommendations. She does not like to try anything without checking with patients PCP first.  She will also fax her notes when they are complete.

## 2020-02-17 DIAGNOSIS — R278 Other lack of coordination: Secondary | ICD-10-CM | POA: Diagnosis not present

## 2020-02-17 DIAGNOSIS — G9340 Encephalopathy, unspecified: Secondary | ICD-10-CM | POA: Diagnosis not present

## 2020-02-17 DIAGNOSIS — I350 Nonrheumatic aortic (valve) stenosis: Secondary | ICD-10-CM | POA: Diagnosis not present

## 2020-02-17 DIAGNOSIS — R2689 Other abnormalities of gait and mobility: Secondary | ICD-10-CM | POA: Diagnosis not present

## 2020-02-17 DIAGNOSIS — R1313 Dysphagia, pharyngeal phase: Secondary | ICD-10-CM | POA: Diagnosis not present

## 2020-02-21 DIAGNOSIS — I350 Nonrheumatic aortic (valve) stenosis: Secondary | ICD-10-CM | POA: Diagnosis not present

## 2020-02-21 DIAGNOSIS — J439 Emphysema, unspecified: Secondary | ICD-10-CM | POA: Diagnosis not present

## 2020-02-21 DIAGNOSIS — M549 Dorsalgia, unspecified: Secondary | ICD-10-CM | POA: Diagnosis not present

## 2020-02-21 DIAGNOSIS — E785 Hyperlipidemia, unspecified: Secondary | ICD-10-CM | POA: Diagnosis not present

## 2020-02-21 DIAGNOSIS — D649 Anemia, unspecified: Secondary | ICD-10-CM | POA: Diagnosis not present

## 2020-02-21 DIAGNOSIS — G8929 Other chronic pain: Secondary | ICD-10-CM | POA: Diagnosis not present

## 2020-02-21 DIAGNOSIS — F0391 Unspecified dementia with behavioral disturbance: Secondary | ICD-10-CM | POA: Diagnosis not present

## 2020-02-21 DIAGNOSIS — F419 Anxiety disorder, unspecified: Secondary | ICD-10-CM | POA: Diagnosis not present

## 2020-02-21 DIAGNOSIS — R911 Solitary pulmonary nodule: Secondary | ICD-10-CM | POA: Diagnosis not present

## 2020-02-24 ENCOUNTER — Other Ambulatory Visit: Payer: Self-pay | Admitting: Internal Medicine

## 2020-02-29 DIAGNOSIS — G8929 Other chronic pain: Secondary | ICD-10-CM | POA: Diagnosis not present

## 2020-02-29 DIAGNOSIS — F0391 Unspecified dementia with behavioral disturbance: Secondary | ICD-10-CM | POA: Diagnosis not present

## 2020-02-29 DIAGNOSIS — I350 Nonrheumatic aortic (valve) stenosis: Secondary | ICD-10-CM | POA: Diagnosis not present

## 2020-02-29 DIAGNOSIS — M549 Dorsalgia, unspecified: Secondary | ICD-10-CM | POA: Diagnosis not present

## 2020-02-29 DIAGNOSIS — F419 Anxiety disorder, unspecified: Secondary | ICD-10-CM | POA: Diagnosis not present

## 2020-02-29 DIAGNOSIS — E785 Hyperlipidemia, unspecified: Secondary | ICD-10-CM | POA: Diagnosis not present

## 2020-02-29 DIAGNOSIS — R911 Solitary pulmonary nodule: Secondary | ICD-10-CM | POA: Diagnosis not present

## 2020-02-29 DIAGNOSIS — D649 Anemia, unspecified: Secondary | ICD-10-CM | POA: Diagnosis not present

## 2020-02-29 DIAGNOSIS — J439 Emphysema, unspecified: Secondary | ICD-10-CM | POA: Diagnosis not present

## 2020-03-03 ENCOUNTER — Other Ambulatory Visit: Payer: Self-pay | Admitting: Internal Medicine

## 2020-03-04 NOTE — Progress Notes (Signed)
   Subjective:    Patient ID: Katherine [name], female    DOB: 10-11-1928, 86 y.o.   MRN: [id number]  HPI 86 year old Female who has become more frail recently.  She has a history of COPD and is followed by pulmonary.  She has some issues ambulating.  We were able to get home physical therapy recertified.  We had suggested a palliative care consult.  Patient has had issues with weight loss over the past year.  She does not have much appetite.  We tried Megace but it made her nauseated.  She eats very little.  Today she weighs 92 pounds.  BMI is 17.10.  She has moderate aortic stenosis  She suffered a compression fracture due to a fall at her home in January 2021.  She fell getting out of bed striking the bedside table.  She was seen in the emergency department and diagnosed with an L1 compression fracture.  Apparently had L1 kyphoplasty by radiologist in late January after seeing her neurosurgeon, Dr. Saintclair Halsted.  Her weight went down to 86 pounds and had been 109 pounds in December 2020.  She has really not gained ground since that time and has had considerable issues with frailty, decreased ambulation, nausea, lack of appetite.  In March 2021 she was seen in the emergency department with altered mental status and decreased appetite.  She was admitted to [facility] which she disliked and came home in April 2021.  She was seen here in May 2021 and her weight was 89 pounds.  We discussed putting her in the skilled nursing facility and completed a form for admission to prednisone but the patient never went to this facility.  Review of Systems     Objective:   Physical Exam She weighs 92 pounds.  Pulse is 102.  Blood pressure 110/70.  Pulse oximetry 92% BMI 17.10 her chest is clear to auscultation.  Cardiac exam regular rate and rhythm 2/6 to 3/6 systolic ejection murmur.  No lower extremity edema.  She is alert.  She is very frail and thin.       Assessment & Plan:  She tells me  she is depressed.  She is on tramadol for pain and trazodone 150 mg at bedtime which she has taken for a long time.  She was started on Lexapro 5 mg daily.  She take Xanax sparingly twice daily for anxiety.  COPD treated with Pulmicort and Ventolin inhaler as well as DuoNeb PR and and Atrovent nebulizer solution.  Because of lack of appetite and intolerance to Megace, we may want to consider prednisone 5 mg daily.  Home health services have been certified.  We have had discussions with home health agencies about whether or not she needs PT or nursing assistance.  They tell us that patient and her husband have had mixed feelings about the services.  We had a conversation about this today and they would like to continue with the services.  It does not seem that she wants to go to a skilled nursing facility.  It seems that palliative care consultation is a good idea.  She has follow-up appointment with Dr. Lamonte Sakai September 24.  We will keep in touch by phone and with home health agency.  I am not sure what more we can do at this point.  Husband is frustrated and had hoped she would improve by now.  35 minutes spent with patient and her husband.

## 2020-03-04 NOTE — Patient Instructions (Addendum)
We will continue with Lexapro and as a real.  Consider prednisone 5 mg daily for appetite stimulation since you are not tolerating Megace due to nausea.  Keep appointment with pulmonologist in September.  Home health services have been recertified.  Palliative care consultation ordered.

## 2020-03-09 ENCOUNTER — Ambulatory Visit: Payer: Medicare PPO | Admitting: Emergency Medicine

## 2020-03-12 ENCOUNTER — Telehealth: Payer: Self-pay | Admitting: Nurse Practitioner

## 2020-03-12 NOTE — Telephone Encounter (Signed)
Rec'd call from patient's husband wanting to reschedule Palliative f/u visit scheduled for today, this was rescheduled for 03/14/20 @ 1 PM.  Notified NP.

## 2020-03-14 ENCOUNTER — Other Ambulatory Visit: Payer: Self-pay

## 2020-03-14 ENCOUNTER — Other Ambulatory Visit: Payer: Medicare PPO | Admitting: Nurse Practitioner

## 2020-03-14 DIAGNOSIS — Z515 Encounter for palliative care: Secondary | ICD-10-CM | POA: Diagnosis not present

## 2020-03-14 DIAGNOSIS — E43 Unspecified severe protein-calorie malnutrition: Secondary | ICD-10-CM

## 2020-03-14 NOTE — Progress Notes (Signed)
[facility] Consult Note Telephone: [phone]  Fax: [phone]  PATIENT NAME: Katherine [name] Katherine [name] [id number] [phone] (home)  DOB: 02/24/1929 MRN: [id number]  PRIMARY CARE PROVIDER:    Elby Showers, MD,  [address] [place] [address] [phone]  REFERRING PROVIDER:   Elby Showers, MD [address] [place],  [place] [address] [phone]  RESPONSIBLE PARTY:   Extended Emergency Contact Information Primary Emergency Contact: Katherine [name] Address: [address]          [place], [place] [address] Katherine [name] Phone: [phone] Mobile Phone: [phone] Relation: Spouse Secondary Emergency Contact: Katherine [name] Mobile Phone: [phone] Relation: Granddaughter  I met face to face with patient and husband in home.  ASSESSMENT AND RECOMMENDATIONS:   1. Advance Care Planning/Goals of Care: Goals of care: Patient's goal of core is function.Per prior discussion desires to live a functional life, patient want to be able to complete her ADLs with minimal assist and if possilbe be able to go on mild grocery run, saying " I want to do more for myself with confidence". Patient however does not want to be a burden to her family in the event of critical illness or acute decline.  Directives: Patient does not want to be resuscitated in the event of cardiac or respiratory arrest. DNR form in home, copy on [facility] EMR. Information about MOST form and a blank copy of MOST form with patient and husband to review, will review when patient is ready.  2. Symptom Management:  Patient report improvement in her sleep since she started taking the recommended Melatonin 72m in the evenings. Patient report improvement in SOB and fatigue since the last visit. Report breathing easier with few episodes of SOB, she report using less of rescue inhaler Albuterol, uses about  once a day and some days none. Patient also report improvement in function. Husband report patient having more stamina, saying "she is not as wobbly". Patient report eating better, reporting improved appetite, she report eating mostly 2 meals a day by choice, saying she is not good with breakfast. She report last weight to be 89lbs, with a height of 5107f.5" her BMI is 16.5kg/m2. Discussed importance and role of adequate nutrition in the maintenance of health. Discussed the need for patient to increase her calorie intake, discussed need to increase her BMI. Patient agreed to work on increasing her BMI to about 20kg/m2. Patient report currently taking one can of Boost nutritional supplement a day. Patient agreed to work on increasing it to 2 cans a day to augment her meal intake.   3. Follow up Palliative Care Visit: Palliative care will continue to follow for goals of care clarification and symptom management. Return in about 8 weeks or prn.  4. Family /Caregiver/Community Supports: Patient husband is her main care giver, they've been married for 8494yr[facility], services not needed at this time per patient. Patient's grand daugther lives close by and assist in care for patient. Patient has two sons who lives out of town.  5. Cognitive / Functional decline: Patient alert, coherent, and more communicative during visit today. She remembered that my hair is shorter today, and we had an interesting conversation about hair. Patient report ability to complete her ADLs independently, able to prepare simple meals. Denied recent fall.  I spent 25 minutes providing this consultation,  from 1pm to 1:25pm. More than  50% of the time in this consultation was spent coordinating communication.   HISTORY OF PRESENT ILLNESS: Katherine [name] is a 86 y.o. year old female with multiple medical problems including  COPD, chronic back pain related to L1 compression  fracture, aortic stenosis with mild regurgitation, anxiety/depression. Patient was recently diagnosed with failure to thrive by her PCP. Palliative Care was asked to follow this patient by consultation request of Katherine [name], Katherine Lick, MD to help address advance care planning and goals of care. This is a follow up visit from 02/13/2020.  CODE STATUS: DNR  PPS: 60%  HOSPICE ELIGIBILITY/DIAGNOSIS: TBD  PAST MEDICAL HISTORY:  Past Medical History:  Diagnosis Date   Alcoholic ([facility])    36 years sober   Allergy    Anemia    Arthritis    Asthma    patient denies   B12 deficiency    COPD (chronic obstructive pulmonary disease) ([facility])    Depression    not recent   Diverticulosis    Esophageal stricture    Headache    cluster headaches occasionally   Hepatic cyst 01/30/10   Hiatal hernia 1985   patient unaware   Hx of adenomatous colonic polyps 2006   Hyperlipidemia    IBS (irritable bowel syndrome)    PONV (postoperative nausea and vomiting)    no nausea or vomitting after surgery 07/2014   Positive PPD    PUD (peptic ulcer disease)    Shortness of breath dyspnea    SOB WITH EXERTION   (NOT NEW)    SOCIAL HX:  Social History   Tobacco Use   Smoking status: Former Smoker    Years: 60.00    Types: Cigarettes    Quit date: 02/02/2010    Years since quitting: 10.1   Smokeless tobacco: Never Used  Substance Use Topics   Alcohol use: No    Comment: recovering alcoholic     53 YRS     FAMILY HX:  Family History  Problem Relation Age of Onset   Ulcers Father    Stroke Father    Irritable bowel syndrome Father    Ovarian cancer Sister    Colon cancer Neg Hx     ALLERGIES: No Known Allergies   PERTINENT MEDICATIONS:  Outpatient Encounter Medications as of 03/14/2020  Medication Sig   albuterol (PROVENTIL) (2.5 MG/3ML) 0.083% nebulizer solution 1 vial in neb every 4-6 hours as needed  Dx: J44.9 (Patient not taking: Reported on 01/26/2020)   albuterol  (VENTOLIN HFA) 108 (90 Base) MCG/ACT inhaler Inhale 2 puffs into the lungs 4 (four) times daily as needed for wheezing or shortness of breath. INHALE 2 PUFFS INTO THE LUNGS 4 TIMES A DAY AS NEEDED   ALPRAZolam (XANAX) 0.25 MG tablet TAKE 1 TABLET (0.25 MG TOTAL) BY MOUTH 2 (TWO) TIMES DAILY AS NEEDED FOR ANXIETY.   budesonide (PULMICORT) 0.5 MG/2ML nebulizer solution Take 2 mLs (0.5 mg total) by nebulization 2 (two) times daily.   escitalopram (LEXAPRO) 5 MG tablet TAKE 1 TABLET BY MOUTH EVERY DAY   ipratropium (ATROVENT) 0.02 % nebulizer solution Take 2.5 mLs (0.5 mg total) by nebulization 4 (four) times daily.   ipratropium-albuterol (DUONEB) 0.5-2.5 (3) MG/3ML SOLN Take 3 mLs by nebulization every 6 (six) hours.   predniSONE (DELTASONE) 5 MG tablet TAKE 1 TABLET BY MOUTH EVERY DAY WITH BREAKFAST   traMADol (ULTRAM) 50 MG tablet Take 50 mg by mouth every 6 (six) hours as needed for moderate pain.  traZODone (DESYREL) 50 MG tablet TAKE 3 TABLETS BY MOUTH AT BEDTIME (Patient taking differently: Take 150 mg by mouth at bedtime as needed for sleep. )   No facility-administered encounter medications on file as of 03/14/2020.    PHYSICAL EXAM / ROS:   General: frail appearing, thin, sitting in chair in her den in NAD Cardiovascular: no chest pain reported, no edema  Pulmonary: no cough, no SOB, room air Abdomen: appetite fair, denied constipation, continent of bowel GU: denies dysuria, continent of urine MSK:  no joint and ROM abnormalities, ambulatory Skin: thin and fragile, no rashes or wounds reported or noted on exposed skin Neurological: weakness, but otherwise non-focal   Jari Favre, DNP, AGPCNP-BC

## 2020-03-18 DIAGNOSIS — R2689 Other abnormalities of gait and mobility: Secondary | ICD-10-CM | POA: Diagnosis not present

## 2020-03-18 DIAGNOSIS — R1313 Dysphagia, pharyngeal phase: Secondary | ICD-10-CM | POA: Diagnosis not present

## 2020-03-18 DIAGNOSIS — G9340 Encephalopathy, unspecified: Secondary | ICD-10-CM | POA: Diagnosis not present

## 2020-03-18 DIAGNOSIS — R278 Other lack of coordination: Secondary | ICD-10-CM | POA: Diagnosis not present

## 2020-03-18 DIAGNOSIS — I350 Nonrheumatic aortic (valve) stenosis: Secondary | ICD-10-CM | POA: Diagnosis not present

## 2020-03-28 ENCOUNTER — Ambulatory Visit: Payer: Medicare PPO | Admitting: Emergency Medicine

## 2020-03-31 ENCOUNTER — Other Ambulatory Visit: Payer: Self-pay | Admitting: Internal Medicine

## 2020-04-18 ENCOUNTER — Encounter: Payer: Self-pay | Admitting: Emergency Medicine

## 2020-04-18 ENCOUNTER — Ambulatory Visit: Payer: Medicare PPO | Admitting: Emergency Medicine

## 2020-04-18 ENCOUNTER — Other Ambulatory Visit: Payer: Self-pay

## 2020-04-18 DIAGNOSIS — I350 Nonrheumatic aortic (valve) stenosis: Secondary | ICD-10-CM | POA: Diagnosis not present

## 2020-04-18 DIAGNOSIS — G9340 Encephalopathy, unspecified: Secondary | ICD-10-CM | POA: Diagnosis not present

## 2020-04-18 DIAGNOSIS — R1313 Dysphagia, pharyngeal phase: Secondary | ICD-10-CM | POA: Diagnosis not present

## 2020-04-18 DIAGNOSIS — R278 Other lack of coordination: Secondary | ICD-10-CM | POA: Diagnosis not present

## 2020-04-18 DIAGNOSIS — J449 Chronic obstructive pulmonary disease, unspecified: Secondary | ICD-10-CM

## 2020-04-18 DIAGNOSIS — R059 Cough, unspecified: Secondary | ICD-10-CM | POA: Diagnosis not present

## 2020-04-18 DIAGNOSIS — R2689 Other abnormalities of gait and mobility: Secondary | ICD-10-CM | POA: Diagnosis not present

## 2020-04-18 DIAGNOSIS — K219 Gastro-esophageal reflux disease without esophagitis: Secondary | ICD-10-CM | POA: Diagnosis not present

## 2020-04-18 MED ORDER — ALBUTEROL SULFATE HFA 108 (90 BASE) MCG/ACT IN AERS: 2.0000 | INHALATION_SPRAY | Freq: Four times a day (QID) | RESPIRATORY_TRACT | 4 refills | Status: DC | PRN

## 2020-04-18 NOTE — Patient Instructions (Addendum)
Please try to do your DuoNeb (albuterol/ipratropium) 4 times a day on a schedule if possible. Do your Pulmicort neb (budesonide) 2 times a day on a schedule. Keep your albuterol inhaler available to do 2 puffs as you needed for shortness of breath Stop prednisone for now.  Keep track of how your breathing does after you come off the medication.  Call our office and let us know when the prednisone was restarted and what dose you have been taking. Continue Pepcid each evening Follow with Dr Lamonte Sakai in 4 months or sooner if you have any problems.

## 2020-04-18 NOTE — Progress Notes (Signed)
Subjective:    Patient ID: Katherine [name], female    DOB: Dec 12, 1928, 86 y.o.   MRN: [id number]  HPI  ROV 01/26/20 --this is a follow-up visit for 86 year old woman with severe COPD, history of a positive PPD, upper airway irritation syndrome and chronic cough.  I last saw her July 2020 but she has been seen multiple times in the interim in our office by other providers.  She has been on chronic prednisone in the past but not currently.  She had been on Trelegy but we are concerned about adequacy of delivery so we changed her to scheduled DuoNeb and budesonide. She has dyspnea with almost any activity, uses albuterol HFA as needed. Some increased cough over the last month, minimally productive. She has heartburn at night. She uses pepcid as needed, most nights.  She started a dehumidifier at home that has helped her.   ROV 04/18/20 --86 year old woman who carries a history of severe COPD and upper airway irritation syndrome with associated chronic cough.  She also has a history of a positive PPD.  She has been on chronic prednisone in the past, not currently.  Underlying contributors to her upper airway irritation include GERD.  Last visit she did a walking oximetry, completed half a lap and then had to sit down due to weakness in her legs.  She did not desaturate.  She is using albuterol frequently through the day. She is using DuoNeb 2-3 times a day, pulmicort bid. She is not coughing much right now. She is still taking pepcid in the evening. She believes that she started back on prednisone once a day when she filled her last meds - they don't know the dose. Has been back on it for about a week.     06/22/18:  FEV1 0.94 (66) FVC 1.72 (89) Ratio 54% RV 131% TLC 106% DLCO/VA 40 % pred   Review of Systems  Constitutional: Negative for activity change, fatigue and unexpected weight change.  HENT: Positive for postnasal drip and voice change.   Eyes: Negative.   Respiratory: Positive  for shortness of breath.   Cardiovascular: Negative.   Gastrointestinal: Negative.   Endocrine: Negative.   Genitourinary: Negative.   Musculoskeletal: Negative for back pain and joint swelling.  Skin: Negative.   Allergic/Immunologic: Negative.   Neurological: Positive for weakness.  Hematological: Negative.   Psychiatric/Behavioral: Negative.         Objective:   Physical Exam Vitals:   04/18/20 1641  BP: 118/68  Pulse: 96  Temp: (!) 97.5 F (36.4 C)  SpO2: 96%  Weight: 95 lb 6.4 oz (43.3 kg)  Height: 5\' 1"  (1.549 m)   Gen: Pleasant, thin elderly woman, in no distress,  normal affect  ENT: No lesions,  mouth clear,  oropharynx clear, no postnasal drip  Neck: No JVD, no stridor  Lungs: No use of accessory muscles, distant, no wheeze on forced exp  Cardiovascular: RRR, holosystolic murmur  Musculoskeletal: No deformities, no cyanosis or clubbing  Neuro: alert, awake, non focal  Skin: Warm, no lesions or rash      Assessment & Plan:  COPD (chronic obstructive pulmonary disease) Symptoms have been up and down.  She uses albuterol frequently, can always take her DuoNeb 4 times daily because her nebulizer machine is upstairs and it is hard to get to.  They are going to try to modify and do the DuoNeb 4 times daily more reliably.  Continue budesonide nebs twice daily.  Apparently the last  time they filled prescriptions at the pharmacy they got prednisone, dose unclear.  She has been on this for about a week.  Not sure whether it is impacted her breathing.  I have asked them to stop it to see if she misses it.  They will call and let me know what dose.  Cough Fairly good control right now, probably because her GERD is well managed.  GE reflux Continue scheduled Pepcid nightly  Baltazar Apo, MD, PhD 04/18/2020, 5:02 PM [facility] [phone] or if no answer [phone]

## 2020-04-18 NOTE — Addendum Note (Signed)
Addended by: Gavin Potters R on: 04/18/2020 05:06 PM   Modules accepted: Orders

## 2020-04-18 NOTE — Assessment & Plan Note (Signed)
Fairly good control right now, probably because her GERD is well managed.

## 2020-04-18 NOTE — Assessment & Plan Note (Signed)
Continue scheduled Pepcid nightly

## 2020-04-18 NOTE — Assessment & Plan Note (Signed)
Symptoms have been up and down.  She uses albuterol frequently, can always take her DuoNeb 4 times daily because her nebulizer machine is upstairs and it is hard to get to.  They are going to try to modify and do the DuoNeb 4 times daily more reliably.  Continue budesonide nebs twice daily.  Apparently the last time they filled prescriptions at the pharmacy they got prednisone, dose unclear.  She has been on this for about a week.  Not sure whether it is impacted her breathing.  I have asked them to stop it to see if she misses it.  They will call and let me know what dose.

## 2020-04-23 ENCOUNTER — Telehealth: Payer: Self-pay | Admitting: Emergency Medicine

## 2020-04-23 NOTE — Telephone Encounter (Signed)
Lm for patient.  

## 2020-04-25 NOTE — Telephone Encounter (Signed)
Lm x2 for patient.  Will close encounter per office protocol.   

## 2020-04-26 ENCOUNTER — Telehealth: Payer: Self-pay | Admitting: Internal Medicine

## 2020-04-26 MED ORDER — TRAZODONE HCL 50 MG PO TABS: 150.0000 mg | ORAL_TABLET | Freq: Every day | ORAL | 0 refills | Status: DC

## 2020-04-26 NOTE — Telephone Encounter (Signed)
Refilled, left voicemail letting him know that this was done.

## 2020-04-26 NOTE — Telephone Encounter (Signed)
Katherine [name] [phone]  Katherine Bouche called to say that [name] needs a refill on below medication and he does not know how to get in touch with Dr Dominica Severin at the [facility] on [facility], and she has not been there in over a year. They filled it last in June so he was wandering if you would fill it. I let him know she would probably need an appointment.

## 2020-04-26 NOTE — Telephone Encounter (Signed)
Refill once 

## 2020-04-27 NOTE — Telephone Encounter (Signed)
Katherine [name] called back today to see if you would take over this prescription permantaley so that Katherine [name] would not have to go back to [facility], they would rather come here.

## 2020-04-30 ENCOUNTER — Other Ambulatory Visit: Payer: Self-pay | Admitting: Internal Medicine

## 2020-04-30 DIAGNOSIS — Z1231 Encounter for screening mammogram for malignant neoplasm of breast: Secondary | ICD-10-CM

## 2020-05-09 ENCOUNTER — Other Ambulatory Visit: Payer: Self-pay

## 2020-05-09 ENCOUNTER — Other Ambulatory Visit: Payer: Medicare PPO | Admitting: Nurse Practitioner

## 2020-05-09 DIAGNOSIS — Z515 Encounter for palliative care: Secondary | ICD-10-CM | POA: Diagnosis not present

## 2020-05-09 DIAGNOSIS — J449 Chronic obstructive pulmonary disease, unspecified: Secondary | ICD-10-CM

## 2020-05-09 NOTE — Progress Notes (Signed)
[facility] Consult Note Telephone: [phone]  Fax: [phone]  PATIENT NAME: Katherine [name] [place] [address] [phone] (home)  DOB: June 04, 1929 MRN: [id number]  PRIMARY CARE PROVIDER:    Elby Showers, MD,  [address] [place] [address] [phone]  REFERRING PROVIDER:   Elby Showers, MD [address] [place],  [place] [address] [phone]  RESPONSIBLE PARTY:   Extended Emergency Contact Information Primary Emergency Contact: Wamble,[name] Address: [address]          [place], [place] [address] Johnnette Litter of [place] Phone: [phone] Mobile Phone: [phone] Relation: Spouse Secondary Emergency Contact: Sunday Shams Mobile Phone: [phone] Relation: Granddaughter  I met face to face with patient and grand daughter Gerald Stabs in home.   ASSESSMENT AND RECOMMENDATIONS:   Advance Care Planning: Goal of care: Patient's goal of care is function.  Directives: Signed DNR form in home, copy on [facility] EMR.   Symptom Management:  Patient report improved function as she is able to get around better. She report increased appetite, report weight gain saying her clothing are snug. Patient report occasional arthritic pain in her fingers and hands which is relieved with application of Asper creme. She however report reduced dexterity to the fingers as she now struggles to Crotchet saying it hurts to use her fingers due to pain, Crotching is an activity she enjoys. Recommended PT/OT which she declined saying it does not do her any good. She endorsed improved sleep with Melatonin. Patient denied respiratory concerns, received COVID-19 booster vaccine without issues. She denied fever, chills or increased SOB. Provided general support and encouragement, no other unmet needs identified.  Follow up Palliative Care Visit: Palliative care will continue to follow for  goals of care clarification and symptom management. Return in about 4-6 weeks or prn.  Family /Caregiver/Community Supports: Patient lives at home with her husband who is her main care giver.  Cognitive / Functional decline: Patient awake, alert, and coherent. Patient able to complete her ADLs with minimal assist, ambulates with a Rolator walker, no falls since last visit.  I spent 48 minutes providing this consultation. More than 50% of the time in this consultation was spent in counseling and coordinating communication.   CHIEF COMPLAINT: Follow up palliative care visit from 03/14/2020  HISTORY OF PRESENT ILLNESS: Katherine [name] a 86 y.o.year old femalewith multiple medical problems including COPD, chronic back pain related to L1 compression fracture, aortic stenosis with mild regurgitation,  anxiety/depression.Patient was recently diagnosed with failure to thrive by her PCP.Palliative Care was asked to help address advance care planning, goals of care, and symptoms management.  CODE STATUS: DNR  PPS: 60%  HOSPICE ELIGIBILITY/DIAGNOSIS: TBD  PHYSICAL EXAM / ROS:   Current and past weights: Patient report gaining weight, no report of current weight General: NAD, frail appearing, thin Cardiovascular: no chest pain reported, no edema  Pulmonary: no cough, no increased SOB, room air GI: appetite fair, denied constipation, continent of bowel GU: denies dysuria, continent of urine MSK:  no joint and ROM abnormalities, ambulatory Skin: no rashes, healing skin tear noted to right lower arm Neurological: Weakness, but otherwise nonfocal  PAST MEDICAL HISTORY:  Past Medical History:  Diagnosis Date  . Alcoholic ([place])    36 years sober  . Allergy   . Anemia   . Arthritis   . Asthma    patient denies  . B12 deficiency   . COPD (chronic obstructive pulmonary  disease) ([place])   . Depression    not recent  . Diverticulosis   . Esophageal stricture   . Headache     cluster headaches occasionally  . Hepatic cyst 01/30/10  . Hiatal hernia 1985   patient unaware  . Hx of adenomatous colonic polyps 2006  . Hyperlipidemia   . IBS (irritable bowel syndrome)   . PONV (postoperative nausea and vomiting)    no nausea or vomitting after surgery 07/2014  . Positive PPD   . PUD (peptic ulcer disease)   . Shortness of breath dyspnea    SOB WITH EXERTION   (NOT NEW)    SOCIAL HX:  Social History   Tobacco Use  . Smoking status: Former Smoker    Years: 60.00    Types: Cigarettes    Quit date: 02/02/2010    Years since quitting: 10.2  . Smokeless tobacco: Never Used  Substance Use Topics  . Alcohol use: No    Comment: recovering alcoholic     35 YRS     FAMILY HX:  Family History  Problem Relation Age of Onset  . Ulcers Father   . Stroke Father   . Irritable bowel syndrome Father   . Ovarian cancer Sister   . Colon cancer Neg Hx     ALLERGIES: No Known Allergies   PERTINENT MEDICATIONS:  Outpatient Encounter Medications as of 05/09/2020  Medication Sig  . albuterol (PROVENTIL) (2.5 MG/3ML) 0.083% nebulizer solution 1 vial in neb every 4-6 hours as needed  Dx: J44.9  . albuterol (VENTOLIN HFA) 108 (90 Base) MCG/ACT inhaler Inhale 2 puffs into the lungs 4 (four) times daily as needed for wheezing or shortness of breath. INHALE 2 PUFFS INTO THE LUNGS 4 TIMES A DAY AS NEEDED  . ALPRAZolam (XANAX) 0.25 MG tablet TAKE 1 TABLET (0.25 MG TOTAL) BY MOUTH 2 (TWO) TIMES DAILY AS NEEDED FOR ANXIETY.  . budesonide (PULMICORT) 0.5 MG/2ML nebulizer solution Take 2 mLs (0.5 mg total) by nebulization 2 (two) times daily.  [place] Kitchen escitalopram (LEXAPRO) 5 MG tablet TAKE 1 TABLET BY MOUTH EVERY DAY  . ipratropium (ATROVENT) 0.02 % nebulizer solution Take 2.5 mLs (0.5 mg total) by nebulization 4 (four) times daily.  [place] Kitchen ipratropium-albuterol (DUONEB) 0.5-2.5 (3) MG/3ML SOLN Take 3 mLs by nebulization every 6 (six) hours.  . predniSONE (DELTASONE) 5 MG tablet TAKE 1 TABLET BY  MOUTH EVERY DAY WITH BREAKFAST  . traMADol (ULTRAM) 50 MG tablet Take 50 mg by mouth every 6 (six) hours as needed for moderate pain.   . traZODone (DESYREL) 50 MG tablet Take 3 tablets (150 mg total) by mouth at bedtime.   No facility-administered encounter medications on file as of 05/09/2020.     Jari Favre, DNP, AGPCNP-BC

## 2020-05-18 DIAGNOSIS — R1313 Dysphagia, pharyngeal phase: Secondary | ICD-10-CM | POA: Diagnosis not present

## 2020-05-18 DIAGNOSIS — I350 Nonrheumatic aortic (valve) stenosis: Secondary | ICD-10-CM | POA: Diagnosis not present

## 2020-05-18 DIAGNOSIS — G9340 Encephalopathy, unspecified: Secondary | ICD-10-CM | POA: Diagnosis not present

## 2020-05-18 DIAGNOSIS — R2689 Other abnormalities of gait and mobility: Secondary | ICD-10-CM | POA: Diagnosis not present

## 2020-05-18 DIAGNOSIS — R278 Other lack of coordination: Secondary | ICD-10-CM | POA: Diagnosis not present

## 2020-05-21 DIAGNOSIS — M47818 Spondylosis without myelopathy or radiculopathy, sacral and sacrococcygeal region: Secondary | ICD-10-CM | POA: Diagnosis not present

## 2020-05-21 DIAGNOSIS — M961 Postlaminectomy syndrome, not elsewhere classified: Secondary | ICD-10-CM | POA: Diagnosis not present

## 2020-05-21 DIAGNOSIS — Z79891 Long term (current) use of opiate analgesic: Secondary | ICD-10-CM | POA: Diagnosis not present

## 2020-05-21 DIAGNOSIS — M4316 Spondylolisthesis, lumbar region: Secondary | ICD-10-CM | POA: Diagnosis not present

## 2020-05-21 DIAGNOSIS — G894 Chronic pain syndrome: Secondary | ICD-10-CM | POA: Diagnosis not present

## 2020-05-21 DIAGNOSIS — M47816 Spondylosis without myelopathy or radiculopathy, lumbar region: Secondary | ICD-10-CM | POA: Diagnosis not present

## 2020-05-25 ENCOUNTER — Other Ambulatory Visit: Payer: Self-pay | Admitting: Internal Medicine

## 2020-05-25 NOTE — Telephone Encounter (Signed)
Please call Mr. Marron and see if he needs this for wife before we refill.

## 2020-05-25 NOTE — Telephone Encounter (Signed)
He said she needs it got 2-3 pills left.

## 2020-05-25 NOTE — Telephone Encounter (Signed)
He wants to know if we can make an appointment for her to be seen just to make sure she is on the right track?  Okay to book, he said she has maintained her weight but she is still nauseous.

## 2020-05-25 NOTE — Telephone Encounter (Signed)
OK to book appt

## 2020-05-28 NOTE — Telephone Encounter (Signed)
Appointment scheduled.

## 2020-06-01 ENCOUNTER — Other Ambulatory Visit: Payer: Self-pay

## 2020-06-01 ENCOUNTER — Encounter: Payer: Self-pay | Admitting: Internal Medicine

## 2020-06-01 ENCOUNTER — Ambulatory Visit (INDEPENDENT_AMBULATORY_CARE_PROVIDER_SITE_OTHER): Payer: Medicare PPO | Admitting: Internal Medicine

## 2020-06-01 VITALS — BP 122/68 | HR 110 | Ht 61.0 in | Wt 92.0 lb

## 2020-06-01 DIAGNOSIS — J449 Chronic obstructive pulmonary disease, unspecified: Secondary | ICD-10-CM

## 2020-06-01 DIAGNOSIS — Z87891 Personal history of nicotine dependence: Secondary | ICD-10-CM

## 2020-06-01 DIAGNOSIS — M549 Dorsalgia, unspecified: Secondary | ICD-10-CM

## 2020-06-01 DIAGNOSIS — Z9889 Other specified postprocedural states: Secondary | ICD-10-CM | POA: Diagnosis not present

## 2020-06-01 DIAGNOSIS — R630 Anorexia: Secondary | ICD-10-CM | POA: Diagnosis not present

## 2020-06-01 DIAGNOSIS — R627 Adult failure to thrive: Secondary | ICD-10-CM | POA: Diagnosis not present

## 2020-06-01 DIAGNOSIS — R531 Weakness: Secondary | ICD-10-CM

## 2020-06-01 DIAGNOSIS — I700 Atherosclerosis of aorta: Secondary | ICD-10-CM

## 2020-06-01 DIAGNOSIS — I350 Nonrheumatic aortic (valve) stenosis: Secondary | ICD-10-CM

## 2020-06-01 DIAGNOSIS — G8929 Other chronic pain: Secondary | ICD-10-CM

## 2020-06-01 DIAGNOSIS — R110 Nausea: Secondary | ICD-10-CM

## 2020-06-01 NOTE — Progress Notes (Signed)
   Subjective:    Patient ID: Katherine [name], female    DOB: Aug 07, 1928, 86 y.o.   MRN: [id number]  HPI 86 year old Female with generalized weakness, nausea, COPD, fraility, lack of appetitte and generalized weakness seen with her husband for follow up.  In June, prealbumin was 20. Eats small amounts. Feels nauseated a lot.  Complains of memory loss. TSH was normal in June. Not surprised with the pandemic and at her age that she is forgetful. Stays inside. Does not really go anywhere. Visits with granddaughter.  Has been seen by Palliative Care. Patient has not found that to be very helpful she says. Sees Dr. Lamonte Sakai for Pulmonary issues. Hx COPD and currently is stable.  Assured husband he is doing all that can be done.I have her on prednisone 5 mg daily to help stimulate appetite Did not tolerate Megace for appetite.  She takes Lexapro and Desyrel for anxiety and depression.  She takes Ultram if needed for pain.  Is Phenergan for nausea.  Has albuterol inhaler and Pulmicort.  COPD seems fairly stable.  Review of Systems see above-longstanding history of back pain seen previously at [facility] but not recently.  She suffered a compression fracture due to a fall in her home in January 2021.  At L1 compression fracture.  Had kyphoplasty for that.  Weight got down to 86 pounds at that time.     Objective:   Physical Exam Blood pressure 122/68 pulse 110 regular pulse oximetry 96% weight is 92 pounds.  In June weight was 88 pounds. Skin is warm and dry.  No thyromegaly.  No carotid bruits.  Chest is clear to auscultation.  Affect thought and judgment appear to be normal.  Cardiac exam: Regular rate and rhythm.  2/6 systolic ejection murmur.  No lower extremity pitting edema.       Assessment & Plan:  Frailty  COPD-stable  History of chronic back pain  Plan: No change in medication therapy.  She will be seen again in 3 months for follow-up.  Husband knows he can call me  if he has any concerns or questions.  I think her issues are stable at the present time.  She does not have much appetite.  I think prefers being left alone for the present time and not having a lot of intervention with home health, PT and palliative care right now.

## 2020-06-12 ENCOUNTER — Ambulatory Visit
Admission: RE | Admit: 2020-06-12 | Discharge: 2020-06-12 | Disposition: A | Discharge status: Home / Self Care | Payer: Medicare PPO | Source: Ambulatory Visit | Attending: Internal Medicine | Admitting: Internal Medicine

## 2020-06-12 ENCOUNTER — Other Ambulatory Visit: Payer: Self-pay

## 2020-06-12 DIAGNOSIS — Z1231 Encounter for screening mammogram for malignant neoplasm of breast: Secondary | ICD-10-CM

## 2020-06-18 DIAGNOSIS — R278 Other lack of coordination: Secondary | ICD-10-CM | POA: Diagnosis not present

## 2020-06-18 DIAGNOSIS — G9340 Encephalopathy, unspecified: Secondary | ICD-10-CM | POA: Diagnosis not present

## 2020-06-18 DIAGNOSIS — R2689 Other abnormalities of gait and mobility: Secondary | ICD-10-CM | POA: Diagnosis not present

## 2020-06-18 DIAGNOSIS — I350 Nonrheumatic aortic (valve) stenosis: Secondary | ICD-10-CM | POA: Diagnosis not present

## 2020-06-18 DIAGNOSIS — R1313 Dysphagia, pharyngeal phase: Secondary | ICD-10-CM | POA: Diagnosis not present

## 2020-06-21 ENCOUNTER — Other Ambulatory Visit: Payer: Self-pay | Admitting: Adult Health

## 2020-06-27 ENCOUNTER — Other Ambulatory Visit: Payer: Self-pay

## 2020-06-27 MED ORDER — PROMETHAZINE HCL 12.5 MG PO TABS: 12.5000 mg | ORAL_TABLET | Freq: Three times a day (TID) | ORAL | 3 refills | Status: DC | PRN

## 2020-07-04 ENCOUNTER — Other Ambulatory Visit: Payer: Medicare PPO | Admitting: Nurse Practitioner

## 2020-07-04 ENCOUNTER — Other Ambulatory Visit: Payer: Self-pay

## 2020-07-08 ENCOUNTER — Other Ambulatory Visit: Payer: Self-pay | Admitting: Internal Medicine

## 2020-07-15 NOTE — Patient Instructions (Signed)
Continue current medications and follow-up here for weight check in the office visit in 3 months

## 2020-07-16 ENCOUNTER — Telehealth: Payer: Self-pay | Admitting: Internal Medicine

## 2020-07-16 NOTE — Telephone Encounter (Signed)
Katherine [name] Shiller [phone]  Katherine [name] called to say that Jo's fingers are bending and hurting and her hands are cramping, very painful, she can no longer needle point like used to could. This has been going on for a couple of months but is getting worse, he does not think it can wait till her appointment in mid March.

## 2020-07-16 NOTE — Telephone Encounter (Signed)
Please try to get more info can she feed herself?

## 2020-07-16 NOTE — Telephone Encounter (Signed)
Called and spoke with Katherine [name], she thinks it may be arthritis, it is worse in her right hand, she is left handed. She is able to eat, dress herself and take care of dailey needs at this time, just very painful at times. Scheduled appointment.

## 2020-07-19 DIAGNOSIS — I350 Nonrheumatic aortic (valve) stenosis: Secondary | ICD-10-CM | POA: Diagnosis not present

## 2020-07-19 DIAGNOSIS — G9340 Encephalopathy, unspecified: Secondary | ICD-10-CM | POA: Diagnosis not present

## 2020-07-19 DIAGNOSIS — R1313 Dysphagia, pharyngeal phase: Secondary | ICD-10-CM | POA: Diagnosis not present

## 2020-07-19 DIAGNOSIS — R278 Other lack of coordination: Secondary | ICD-10-CM | POA: Diagnosis not present

## 2020-07-19 DIAGNOSIS — R2689 Other abnormalities of gait and mobility: Secondary | ICD-10-CM | POA: Diagnosis not present

## 2020-07-26 ENCOUNTER — Other Ambulatory Visit: Payer: Self-pay

## 2020-07-26 ENCOUNTER — Encounter: Payer: Self-pay | Admitting: Internal Medicine

## 2020-07-26 ENCOUNTER — Ambulatory Visit: Payer: Medicare PPO | Admitting: Internal Medicine

## 2020-07-26 VITALS — BP 120/70 | HR 96 | Ht 61.0 in | Wt 99.0 lb

## 2020-07-26 DIAGNOSIS — M65331 Trigger finger, right middle finger: Secondary | ICD-10-CM

## 2020-07-26 DIAGNOSIS — G4700 Insomnia, unspecified: Secondary | ICD-10-CM | POA: Diagnosis not present

## 2020-07-26 DIAGNOSIS — I350 Nonrheumatic aortic (valve) stenosis: Secondary | ICD-10-CM

## 2020-07-26 DIAGNOSIS — M65341 Trigger finger, right ring finger: Secondary | ICD-10-CM

## 2020-07-26 DIAGNOSIS — J449 Chronic obstructive pulmonary disease, unspecified: Secondary | ICD-10-CM

## 2020-07-26 MED ORDER — ALPRAZOLAM 0.25 MG PO TABS: ORAL_TABLET | ORAL | 2 refills | Status: DC

## 2020-07-26 NOTE — Progress Notes (Signed)
   Subjective:    Patient ID: Katherine [name], female    DOB: April 07, 1929, 87 y.o.   MRN: [id number]  HPI  87 year old Female looks younger than stated age presents with fingers locking right hand. Wants to crochet and finds this annoying. Says this is a new problem. Hx of closed left  wrist fracture december 2016 seen by Dr. Burney Gauze. Has COPD which is stable. Has some mild memory loss. Is on prednisone 5 mg daily for breathing and appetite. Says she has insomnia. Takes Desyrel and has taken this for years. Will add small dose of Xanax at bedtime    Review of Systems see above- has nausea at times and takes Phenergan     Objective:   Physical Exam BP 120/70 pulse 96 pulse ox 96% weight 99 pounds BMI 18.71  Has gained 7 pounds since mi December  Has trigger fingers right third and fourth fingers  Chest is clear to auscultation.  Cardiac exam regular rate and rhythm 2/6 to 3/6 systolic ejection murmur holosystolic which is harsh.( Hx of aortic stenosis) last echo was done in 2019.  Can repeat in the near future.     Assessment & Plan:  Right hand third and fourth trigger fingers  COPD-stable  Fraility  Aortic stenosis  Plan: She looks great today.  I think she should see hand surgeon about injections for trigger fingers.  She is agreeable to this.

## 2020-07-26 NOTE — Patient Instructions (Addendum)
Referral to hand surgeon regarding to trigger fingers right hand.  Have refilled Xanax to take for anxiety/ insomnia.  She may take it twice daily: in am for anxiety  and at bedtime for sleep.

## 2020-07-27 ENCOUNTER — Telehealth: Payer: Self-pay | Admitting: Emergency Medicine

## 2020-07-27 DIAGNOSIS — J449 Chronic obstructive pulmonary disease, unspecified: Secondary | ICD-10-CM

## 2020-07-27 NOTE — Telephone Encounter (Signed)
Spoke with the pt's spouse ok per DPR  Neb tubing broke and she needs more ordered through [facility]  He states she has not been able to take a tx over the past day and fears that if we order supplies now it may be a couple days before they receive  I advised that in this case will leave him a neb kit up front to pick up so she does not go without meds  New supplies ordered and he was urged to just let us know before they run out so that we can order more  Nothing further needed

## 2020-08-02 ENCOUNTER — Telehealth: Payer: Self-pay | Admitting: Emergency Medicine

## 2020-08-02 MED ORDER — ALBUTEROL SULFATE HFA 108 (90 BASE) MCG/ACT IN AERS: 2.0000 | INHALATION_SPRAY | Freq: Four times a day (QID) | RESPIRATORY_TRACT | 4 refills | Status: DC | PRN

## 2020-08-02 NOTE — Telephone Encounter (Signed)
Refill of the albuterol HFA has been sent to the pharmacy per pts request.

## 2020-08-03 ENCOUNTER — Emergency Department (HOSPITAL_COMMUNITY)
Admission: EM | Admit: 2020-08-03 | Discharge: 2020-08-03 | Disposition: A | Discharge status: Home / Self Care | Payer: Medicare PPO | Attending: Emergency Medicine | Admitting: Emergency Medicine

## 2020-08-03 ENCOUNTER — Emergency Department (HOSPITAL_COMMUNITY): Payer: Medicare PPO

## 2020-08-03 ENCOUNTER — Encounter (HOSPITAL_COMMUNITY): Payer: Self-pay | Admitting: Emergency Medicine

## 2020-08-03 ENCOUNTER — Other Ambulatory Visit: Payer: Self-pay

## 2020-08-03 DIAGNOSIS — M545 Low back pain, unspecified: Secondary | ICD-10-CM | POA: Insufficient documentation

## 2020-08-03 DIAGNOSIS — M25559 Pain in unspecified hip: Secondary | ICD-10-CM | POA: Diagnosis not present

## 2020-08-03 DIAGNOSIS — R102 Pelvic and perineal pain: Secondary | ICD-10-CM | POA: Insufficient documentation

## 2020-08-03 DIAGNOSIS — Z79899 Other long term (current) drug therapy: Secondary | ICD-10-CM | POA: Diagnosis not present

## 2020-08-03 DIAGNOSIS — W19XXXA Unspecified fall, initial encounter: Secondary | ICD-10-CM | POA: Diagnosis not present

## 2020-08-03 DIAGNOSIS — R0781 Pleurodynia: Secondary | ICD-10-CM | POA: Insufficient documentation

## 2020-08-03 DIAGNOSIS — Z87891 Personal history of nicotine dependence: Secondary | ICD-10-CM | POA: Diagnosis not present

## 2020-08-03 DIAGNOSIS — R000 Tachycardia, unspecified: Secondary | ICD-10-CM | POA: Diagnosis not present

## 2020-08-03 DIAGNOSIS — M25552 Pain in left hip: Secondary | ICD-10-CM | POA: Diagnosis not present

## 2020-08-03 DIAGNOSIS — Z043 Encounter for examination and observation following other accident: Secondary | ICD-10-CM | POA: Diagnosis not present

## 2020-08-03 DIAGNOSIS — J449 Chronic obstructive pulmonary disease, unspecified: Secondary | ICD-10-CM | POA: Diagnosis not present

## 2020-08-03 LAB — CBC WITH DIFFERENTIAL/PLATELET
Abs Immature Granulocytes: 0.03 10*3/uL (ref 0.00–0.07)
Basophils Absolute: 0.1 10*3/uL (ref 0.0–0.1)
Basophils Relative: 1 %
Eosinophils Absolute: 0.1 10*3/uL (ref 0.0–0.5)
Eosinophils Relative: 1 %
HCT: 34.5 % — ABNORMAL LOW (ref 36.0–46.0)
Hemoglobin: 10.8 g/dL — ABNORMAL LOW (ref 12.0–15.0)
Immature Granulocytes: 0 %
Lymphocytes Relative: 7 %
Lymphs Abs: 0.8 10*3/uL (ref 0.7–4.0)
MCH: 29 pg (ref 26.0–34.0)
MCHC: 31.3 g/dL (ref 30.0–36.0)
MCV: 92.5 fL (ref 80.0–100.0)
Monocytes Absolute: 0.8 10*3/uL (ref 0.1–1.0)
Monocytes Relative: 7 %
Neutro Abs: 9.3 10*3/uL — ABNORMAL HIGH (ref 1.7–7.7)
Neutrophils Relative %: 84 %
Platelets: 193 10*3/uL (ref 150–400)
RBC: 3.73 MIL/uL — ABNORMAL LOW (ref 3.87–5.11)
RDW: 13.9 % (ref 11.5–15.5)
WBC: 11 10*3/uL — ABNORMAL HIGH (ref 4.0–10.5)
nRBC: 0 % (ref 0.0–0.2)

## 2020-08-03 LAB — BASIC METABOLIC PANEL
Anion gap: 10 (ref 5–15)
BUN: 24 mg/dL — ABNORMAL HIGH (ref 8–23)
CO2: 25 mmol/L (ref 22–32)
Calcium: 9.2 mg/dL (ref 8.9–10.3)
Chloride: 106 mmol/L (ref 98–111)
Creatinine, Ser: 1.1 mg/dL — ABNORMAL HIGH (ref 0.44–1.00)
GFR, Estimated: 48 mL/min — ABNORMAL LOW (ref >60)
Glucose, Bld: 130 mg/dL — ABNORMAL HIGH (ref 70–99)
Potassium: 3.9 mmol/L (ref 3.5–5.1)
Sodium: 141 mmol/L (ref 135–145)

## 2020-08-03 MED ORDER — SODIUM CHLORIDE 0.9 % IV SOLN: INTRAVENOUS | Status: DC

## 2020-08-03 MED ORDER — MORPHINE SULFATE (PF) 2 MG/ML IV SOLN
2.0000 mg | Freq: Once | INTRAVENOUS | Status: AC
  Administered 2020-08-03: 2 mg via INTRAVENOUS
  Filled 2020-08-03: qty 1

## 2020-08-03 MED ORDER — HYDROCODONE-ACETAMINOPHEN 5-325 MG PO TABS: 1.0000 | ORAL_TABLET | ORAL | 0 refills | Status: DC | PRN

## 2020-08-03 NOTE — ED Provider Notes (Signed)
[facility] DEPT Provider Note   CSN: [id number] Arrival date & time: 08/03/20  8099     History Chief Complaint  Patient presents with  . Hip Pain    Katherine [name] is a 87 y.o. female.  87 year old female presents after mechanical fall which occurred just prior to arrival.  States that she is using her walker and lost her balance.  Denies any head or neck trauma from this.  Complains of sharp pain to her left lateral ribs, pelvis, lumbar spine.  Pain characterizes sharp and worse with movement.  Took 25 mg of tramadol prior to arrival with limited relief.  Denies any abdominal discomfort.  She has not been short of breath.  No weakness to upper or lower extremities        Past Medical History:  Diagnosis Date  . Alcoholic ([employer])    36 years sober  . Allergy   . Anemia   . Arthritis   . Asthma    patient denies  . B12 deficiency   . COPD (chronic obstructive pulmonary disease) ([place])   . Depression    not recent  . Diverticulosis   . Esophageal stricture   . Headache    cluster headaches occasionally  . Hepatic cyst 01/30/10  . Hiatal hernia 1985   patient unaware  . Hx of adenomatous colonic polyps 2006  . Hyperlipidemia   . IBS (irritable bowel syndrome)   . PONV (postoperative nausea and vomiting)    no nausea or vomitting after surgery 07/2014  . Positive PPD   . PUD (peptic ulcer disease)   . Shortness of breath dyspnea    SOB WITH EXERTION   (NOT NEW)    Patient Active Problem List   Diagnosis Date Noted  . Malnutrition of mild degree ([place]) 01/27/2020  . Cough 01/26/2020  . Lung nodule 08/11/2019  . Aortic stenosis 07/16/2017  . Painful orthopaedic hardware ([place]) 12/13/2015  . Closed Colles' fracture of left radius 06/14/2015  . Spinal stenosis of lumbar region 03/21/2015  . Spondylolisthesis at L4-L5 level 08/02/2014  . History of non anemic vitamin B12 deficiency 01/02/2013  . History 01/02/2013  . History of  pyloroplasty 01/02/2013  . History of Helicobacter pylori infection 01/02/2013  . History of depression 01/02/2013  . History of positive PPD 01/02/2013  . Anemia, iron deficiency 11/15/2012  . COPD (chronic obstructive pulmonary disease) ([place]) 07/06/2012  . Hyperlipidemia 12/24/2010  . GE reflux 12/24/2010  . Chronic back pain 12/24/2010    Past Surgical History:  Procedure Laterality Date  . ABDOMINAL HYSTERECTOMY    . APPENDECTOMY    . BARTHOLIN GLAND CYST EXCISION     x2  . BREAST BIOPSY Bilateral    Milk glnd  . CATARACT EXTRACTION W/ INTRAOCULAR LENS  IMPLANT, BILATERAL Bilateral   . COLONOSCOPY    . COLONOSCOPY W/ POLYPECTOMY    . HARDWARE REMOVAL N/A 12/13/2015   Procedure: Removal of HARDWARE  Lumbar Five-Sacral One ;  Surgeon: Kary Kos, MD;  Location: [facility];  Service: Neurosurgery;  Laterality: N/A;  . IR KYPHO LUMBAR INC FX REDUCE BONE BX UNI/BIL CANNULATION INC/IMAGING  07/13/2019  . UPPER GI ENDOSCOPY    . VAGOTOMY AND PYLOROPLASTY     in [place], [place]     OB History   No obstetric history on file.     Family History  Problem Relation Age of Onset  . Ulcers Father   . Stroke Father   .  Irritable bowel syndrome Father   . Ovarian cancer Sister   . Colon cancer Neg Hx     Social History   Tobacco Use  . Smoking status: Former Smoker    Years: 60.00    Types: Cigarettes    Quit date: 02/02/2010    Years since quitting: 10.5  . Smokeless tobacco: Never Used  Substance Use Topics  . Alcohol use: No    Comment: recovering alcoholic     35 YRS    . Drug use: No    Home Medications Prior to Admission medications   Medication Sig Start Date End Date Taking? Authorizing Provider  albuterol (PROVENTIL) (2.5 MG/3ML) 0.083% nebulizer solution INHALE 1 VIAL VIA NEBULIZER EVERY 4 TO 6 HOURS AS NEEDED 06/21/20   Byrum, Rose Fillers, MD  albuterol (VENTOLIN HFA) 108 (90 Base) MCG/ACT inhaler Inhale 2 puffs into the lungs 4 (four) times daily as needed for  wheezing or shortness of breath. INHALE 2 PUFFS INTO THE LUNGS 4 TIMES A DAY AS NEEDED 08/02/20   Collene Gobble, MD  ALPRAZolam Duanne Moron) 0.25 MG tablet Take one tab up to twice daily as needed for anxiety and sleep 07/26/20   Elby Showers, MD  budesonide (PULMICORT) 0.5 MG/2ML nebulizer solution Take 2 mLs (0.5 mg total) by nebulization 2 (two) times daily. 12/08/19   [name], NP  escitalopram (LEXAPRO) 5 MG tablet TAKE 1 TABLET BY MOUTH EVERY DAY 03/03/20   Elby Showers, MD  ipratropium (ATROVENT) 0.02 % nebulizer solution Take 2.5 mLs (0.5 mg total) by nebulization 4 (four) times daily. 02/17/19   Elby Showers, MD  ipratropium-albuterol (DUONEB) 0.5-2.5 (3) MG/3ML SOLN Take 3 mLs by nebulization every 6 (six) hours. 12/08/19   [name], NP  promethazine (PHENERGAN) 12.5 MG tablet Take 1 tablet (12.5 mg total) by mouth every 8 (eight) hours as needed for nausea or vomiting. 06/27/20   Baxley, Cresenciano Lick, MD  traMADol (ULTRAM) 50 MG tablet Take 50 mg by mouth every 6 (six) hours as needed for moderate pain.  07/18/19   [provider]  traZODone (DESYREL) 50 MG tablet Take 3 tablets (150 mg total) by mouth at bedtime. 04/26/20   Elby Showers, MD    Allergies    Patient has no known allergies.  Review of Systems   Review of Systems  All other systems reviewed and are negative.   Physical Exam Updated Vital Signs BP (!) 141/76 (BP Location: Right Arm)   Pulse (!) 112   Temp 97.8 F (36.6 C) (Oral)   Resp 20   Ht 1.549 m (5\' 1" )   Wt 45 kg   SpO2 96%   BMI 18.74 kg/m   Physical Exam Vitals and nursing note reviewed.  Constitutional:      General: She is not in acute distress.    Appearance: Normal appearance. She is well-developed and well-nourished. She is not toxic-appearing.  HENT:     Head: Normocephalic and atraumatic.  Eyes:     General: Lids are normal.     Extraocular Movements: EOM normal.     Conjunctiva/sclera: Conjunctivae normal.     Pupils: Pupils  are equal, round, and reactive to light.  Neck:     Thyroid: No thyroid mass.     Trachea: No tracheal deviation.  Cardiovascular:     Rate and Rhythm: Normal rate and regular rhythm.     Heart sounds: Normal heart sounds. No murmur heard. No gallop.  Pulmonary:     Effort: Pulmonary effort is normal. No respiratory distress.     Breath sounds: Normal breath sounds. No stridor. No decreased breath sounds, wheezing, rhonchi or rales.  Abdominal:     General: Bowel sounds are normal. There is no distension.     Palpations: Abdomen is soft.     Tenderness: There is no abdominal tenderness. There is no CVA tenderness or rebound.  Musculoskeletal:        General: No tenderness or edema. Normal range of motion.     Cervical back: Normal range of motion and neck supple.     Thoracic back: Bony tenderness present.       Back:       Legs:     Comments: Full range of motion at bilateral lower extremities.  No shortening or rotation noted to the patient's legs.  Some pain with internal rotation of the patient's left lower extremity.  Neurovascular intact at both feet  Skin:    General: Skin is warm and dry.     Findings: No abrasion or rash.  Neurological:     Mental Status: She is alert and oriented to person, place, and time.     GCS: GCS eye subscore is 4. GCS verbal subscore is 5. GCS motor subscore is 6.     Cranial Nerves: No cranial nerve deficit.     Sensory: No sensory deficit.     Deep Tendon Reflexes: Strength normal.  Psychiatric:        Mood and Affect: Mood and affect normal.        Speech: Speech normal.        Behavior: Behavior normal.     ED Results / Procedures / Treatments   Labs (all labs ordered are listed, but only abnormal results are displayed) Labs Reviewed - No data to display  EKG None  Radiology No results found.  Procedures Procedures   Medications Ordered in ED Medications - No data to display  ED Course  I have reviewed the triage vital  signs and the nursing notes.  Pertinent labs & imaging results that were available during my care of the patient were reviewed by me and considered in my medical decision making (see chart for details).    MDM Rules/Calculators/A&P                          Patient's labs are reassuring here. X-ray of patient's lumbar spine, left hip and pelvis, ribs are negative. Medicated for pain here and feels better. Will ambulate patient prior to discharge to make sure she is at her baseline. Final Clinical Impression(s) / ED Diagnoses Final diagnoses:  None    Rx / DC Orders ED Discharge Orders    None       Lacretia Leigh, MD 08/03/20 1058

## 2020-08-03 NOTE — ED Triage Notes (Signed)
BIB husband, had a mechanical fall last night, complains of L hip pain. Able to ambulate w/ pain, no hx of hip fx or replacement. No shortening or rotation.

## 2020-08-07 DIAGNOSIS — M65351 Trigger finger, right little finger: Secondary | ICD-10-CM | POA: Diagnosis not present

## 2020-08-07 DIAGNOSIS — S63659D Sprain of metacarpophalangeal joint of unspecified finger, subsequent encounter: Secondary | ICD-10-CM | POA: Diagnosis not present

## 2020-08-07 DIAGNOSIS — M65331 Trigger finger, right middle finger: Secondary | ICD-10-CM | POA: Diagnosis not present

## 2020-08-07 DIAGNOSIS — S63659A Sprain of metacarpophalangeal joint of unspecified finger, initial encounter: Secondary | ICD-10-CM | POA: Diagnosis not present

## 2020-08-07 DIAGNOSIS — M79641 Pain in right hand: Secondary | ICD-10-CM | POA: Diagnosis not present

## 2020-08-09 ENCOUNTER — Other Ambulatory Visit: Payer: Self-pay

## 2020-08-09 ENCOUNTER — Ambulatory Visit (INDEPENDENT_AMBULATORY_CARE_PROVIDER_SITE_OTHER)
Admission: RE | Admit: 2020-08-09 | Discharge: 2020-08-09 | Disposition: A | Discharge status: Home / Self Care | Payer: Medicare PPO | Source: Ambulatory Visit | Attending: Adult Health | Admitting: Adult Health

## 2020-08-09 DIAGNOSIS — J432 Centrilobular emphysema: Secondary | ICD-10-CM | POA: Diagnosis not present

## 2020-08-09 DIAGNOSIS — I700 Atherosclerosis of aorta: Secondary | ICD-10-CM | POA: Diagnosis not present

## 2020-08-09 DIAGNOSIS — R911 Solitary pulmonary nodule: Secondary | ICD-10-CM

## 2020-08-09 DIAGNOSIS — I2510 Atherosclerotic heart disease of native coronary artery without angina pectoris: Secondary | ICD-10-CM | POA: Diagnosis not present

## 2020-08-10 ENCOUNTER — Other Ambulatory Visit: Payer: Medicare PPO | Admitting: Nurse Practitioner

## 2020-08-10 DIAGNOSIS — K5903 Drug induced constipation: Secondary | ICD-10-CM | POA: Diagnosis not present

## 2020-08-10 DIAGNOSIS — Z515 Encounter for palliative care: Secondary | ICD-10-CM | POA: Diagnosis not present

## 2020-08-10 NOTE — Progress Notes (Signed)
[facility] Consult Note Telephone: [phone]  Fax: [phone]  PATIENT NAME: Katherine [name] [id number] [phone] (home)  DOB: 1929/05/19 MRN: [id number]  PRIMARY CARE PROVIDER:    Elby Showers, MD,  [address] [place] [address] [phone]  REFERRING PROVIDER:   Elby Showers, MD [address] [place],  [place] [address] [phone]  RESPONSIBLE PARTY:   Extended Emergency Contact Information Primary Emergency Contact: Mcilvaine,[name] Address: [address]          [place], [place] [address] Johnnette Litter of [place] Phone: [phone] Mobile Phone: [phone] Relation: Spouse Secondary Emergency Contact: Sunday Shams Mobile Phone: [phone] Relation: Granddaughter  I met face to face with patient and husband in home.   ASSESSMENT AND RECOMMENDATIONS:   Advance Care Planning: Patient's goal of care is function. She has a signed DNR form in home, copy on [facility] EMR.The need to complete a MOST form was discussed today, patient and her husband expressed interest in completing a MOST form. Discussed and reviewed sections of the form in detail, opportunity for questions given, all questions answered. Form completed and signed with husband, signed form given to husband to keep, copy of MOST form uploaded to [facility] EMR.  Symptom Management:  Pain: s/p mechanical fall a week ago, strain to right had, hand in brace, endorsed 0/10 pain today. Following up with Ortho on 08/22/2020. Continue current pain regimen of Tramadol every 6hrs as needed. Patient not taking Hydrocodone -Acetaminophen report it makes her nauseous and caused her SOB. Constipation: last BM 5 days ago. Denied abdominal pain, denied abdominal distension.  Recommendation: Recommend starting Senokot 8.8m, take one tablet by mouth daily. If no BM in two days, may increase to one  table twice a day.   Follow up Palliative Care Visit: Palliative care will continue to follow for complex decision making and symptom management. Return in about 6 weeks or prn.  Family /Caregiver/Community Supports: Patient lives at home with her husband. Her husband is her main care giver.  Cognitive / Functional decline: Patient awake, alert, and coherent, able to complete her ADLs with minimal assist, ambulates with a Rolator walker, activity intolerance, had a mechanical fall a week ago.  I spent 30 minutes providing this consultation, time includes time spent with patient and amily, chart review, provider coordination, and documentation. More than 50% of the time in this consultation was spent counseling and coordinating communication.   CHIEF COMPLAINT: Constipation  History obtained from review of EMR and  discussion with patient and husband. Records reviewed and summarized bellow.  HISTORY OF PRESENT ILLNESS:Katherine [name] a 87y.o.year old femalewith multiple medical problems including COPD, chronic back pain related to L1 compression fracture, aortic stenosis with mild regurgitation, anxiety/depression. Patient had mechanical fall a week ago, report losing her balance, she was evaluated in the emergency room, no fracture, had a sprained right wrist, discharged home with recommendation to follow up with Ortho outpatient. Palliative Care was asked to help address advance care planning, goals of care, and symptoms management. This a follow up visit.  CODE STATUS: DNR  PPS: 60%  HOSPICE ELIGIBILITY/DIAGNOSIS: TBD  ROS/staff/patient General: endorsed chronic fatigue, denied fever, denied chil EYES: denies vision changes, wears glasses at times ENMT: denies dysphagia Cardiovascular: denies chest pain, denied palpitation Pulmonary: denies cough, denies increased SOB Abdomen: endorses fair appetite, endorses constipation, denied continence of bowel GU: denies  dysuria, denied incontinence of  urine MSK:  endorses ROM limitations Skin: denies rashes or wounds Neurological: endorses weakness, denies pain,  Psych: Endorses positive mood Heme/lymph/immuno: denies bruises, abnormal bleeding   Physical Exam: Current and past weights: 99lbs, Ht 71f 1", BMI 18.74kg/m2 Constitutional: NAD General: frail appearing, thin CV:  no LE edema Pulmonary: no increased work of breathing, no acute cough, no audible wheezes, room air MSK: severe sarcopenia, decreased ROM in all extremities, ambulatory Skin: warm and dry, no rashes or wounds on visible skin Neuro: weakness, otherwise non focal Psych: non-anxious affect today, A and O x 3 Hem/lymph/immuno: no widespread bruising   PAST MEDICAL HISTORY:  Past Medical History:  Diagnosis Date  . [place] ([place]    36 years sober  . Allergy   . Anemia   . Arthritis   . Asthma    patient denies  . B12 deficiency   . COPD (chronic obstructive pulmonary disease) ([place]   . Depression    not recent  . Diverticulosis   . Esophageal stricture   . Headache    cluster headaches occasionally  . Hepatic cyst 01/30/10  . Hiatal hernia 1985   patient unaware  . Hx of adenomatous colonic polyps 2006  . Hyperlipidemia   . IBS (irritable bowel syndrome)   . PONV (postoperative nausea and vomiting)    no nausea or vomitting after surgery 07/2014  . Positive PPD   . PUD (peptic ulcer disease)   . Shortness of breath dyspnea    SOB WITH EXERTION   (NOT NEW)    SOCIAL HX:  Social History   Tobacco Use  . Smoking status: Former Smoker    Years: 60.00    Types: Cigarettes    Quit date: 02/02/2010    Years since quitting: 10.5  . Smokeless tobacco: Never Used  Substance Use Topics  . Alcohol use: No    Comment: recovering alcoholic     35 YRS     FAMILY HX:  Family History  Problem Relation Age of Onset  . Ulcers Father   . Stroke Father   . Irritable bowel syndrome Father   . Ovarian cancer Sister   .  Colon cancer Neg Hx     ALLERGIES: No Known Allergies   PERTINENT MEDICATIONS:  Outpatient Encounter Medications as of 08/10/2020  Medication Sig  . albuterol (PROVENTIL) (2.5 MG/3ML) 0.083% nebulizer solution INHALE 1 VIAL VIA NEBULIZER EVERY 4 TO 6 HOURS AS NEEDED  . albuterol (VENTOLIN HFA) 108 (90 Base) MCG/ACT inhaler Inhale 2 puffs into the lungs 4 (four) times daily as needed for wheezing or shortness of breath. INHALE 2 PUFFS INTO THE LUNGS 4 TIMES A DAY AS NEEDED  . ALPRAZolam (XANAX) 0.25 MG tablet Take one tab up to twice daily as needed for anxiety and sleep  . budesonide (PULMICORT) 0.5 MG/2ML nebulizer solution Take 2 mLs (0.5 mg total) by nebulization 2 (two) times daily.  .Marland Kitchenescitalopram (LEXAPRO) 5 MG tablet TAKE 1 TABLET BY MOUTH EVERY DAY  . HYDROcodone-acetaminophen (NORCO/VICODIN) 5-325 MG tablet Take 1-2 tablets by mouth every 4 (four) hours as needed.  .Marland Kitchenipratropium (ATROVENT) 0.02 % nebulizer solution Take 2.5 mLs (0.5 mg total) by nebulization 4 (four) times daily.  .Marland Kitchenipratropium-albuterol (DUONEB) 0.5-2.5 (3) MG/3ML SOLN Take 3 mLs by nebulization every 6 (six) hours.  . promethazine (PHENERGAN) 12.5 MG tablet Take 1 tablet (12.5 mg total) by mouth every 8 (eight) hours as needed for nausea or vomiting.  . traMADol (ULTRAM) 50 MG tablet  Take 50 mg by mouth every 6 (six) hours as needed for moderate pain.   . traZODone (DESYREL) 50 MG tablet Take 3 tablets (150 mg total) by mouth at bedtime.   No facility-administered encounter medications on file as of 08/10/2020.    Thank you for the opportunity to participate in the care of Mrs. Katherine Huisman. The palliative care team will continue to follow. Please call our office at [phone] if we can be of additional assistance.  Jari Favre , DNP, AGPCNP-BC

## 2020-08-11 ENCOUNTER — Other Ambulatory Visit: Payer: Self-pay | Admitting: Internal Medicine

## 2020-08-14 ENCOUNTER — Telehealth: Payer: Self-pay | Admitting: Adult Health

## 2020-08-14 NOTE — Telephone Encounter (Signed)
Katherine [name] husband is returning phone call. Katherine [name] phone number is [phone].

## 2020-08-14 NOTE — Telephone Encounter (Signed)
Spoke with pt's husband per DPR and reviewed CT results per Tammy. Also scheduled pt for f/u with Dr. Lamonte Sakai on 09/19/20. Pt's husband stated understanding. Nothing further needed at this time.

## 2020-08-14 NOTE — Telephone Encounter (Signed)
ATC LVMTCB x 1  

## 2020-08-15 NOTE — Progress Notes (Signed)
Called and spoke with patient, advised of results/recommendations per Rexene Edison NP, she verbalized understanding.  Nothing further needed.

## 2020-08-16 DIAGNOSIS — R278 Other lack of coordination: Secondary | ICD-10-CM | POA: Diagnosis not present

## 2020-08-16 DIAGNOSIS — I350 Nonrheumatic aortic (valve) stenosis: Secondary | ICD-10-CM | POA: Diagnosis not present

## 2020-08-16 DIAGNOSIS — R2689 Other abnormalities of gait and mobility: Secondary | ICD-10-CM | POA: Diagnosis not present

## 2020-08-16 DIAGNOSIS — G9340 Encephalopathy, unspecified: Secondary | ICD-10-CM | POA: Diagnosis not present

## 2020-08-16 DIAGNOSIS — R1313 Dysphagia, pharyngeal phase: Secondary | ICD-10-CM | POA: Diagnosis not present

## 2020-08-28 DIAGNOSIS — M65331 Trigger finger, right middle finger: Secondary | ICD-10-CM | POA: Diagnosis not present

## 2020-08-28 DIAGNOSIS — M65351 Trigger finger, right little finger: Secondary | ICD-10-CM | POA: Diagnosis not present

## 2020-08-28 DIAGNOSIS — M79641 Pain in right hand: Secondary | ICD-10-CM | POA: Diagnosis not present

## 2020-08-28 DIAGNOSIS — S63659A Sprain of metacarpophalangeal joint of unspecified finger, initial encounter: Secondary | ICD-10-CM | POA: Diagnosis not present

## 2020-08-29 ENCOUNTER — Telehealth: Payer: Self-pay | Admitting: Internal Medicine

## 2020-08-29 NOTE — Telephone Encounter (Addendum)
[name] [place] [phone]  [name] stop by this morning to drop off some FL2's he is considering again putting [name] in one. He went by and spoke with them at [facility] this morning and he has spoke with Quincy Carnes in the past. He would like for you to call him when you have time to talk about this. [name] has falling several times in the last month, one time she went to the emergency room, her last fall was this week and that is what has got him started on think he may have to do something sooner than later. [name] has a 3 month follow up on 09/04/2020

## 2020-08-30 ENCOUNTER — Telehealth: Payer: Self-pay | Admitting: Internal Medicine

## 2020-08-30 NOTE — Telephone Encounter (Signed)
Husband called back.  Says he talked to patient's daughters and they would like to pursue placement.  FL 2  form will be completed today.  Patient has an appointment here next week.

## 2020-08-30 NOTE — Telephone Encounter (Signed)
Spoke with Mr. Katherine [name]. Will discuss further at her appt. Next week. Has only fallen once recently. I still favor her staying at home.

## 2020-08-31 ENCOUNTER — Telehealth: Payer: Self-pay | Admitting: Internal Medicine

## 2020-08-31 NOTE — Addendum Note (Signed)
Addended by: Mady Haagensen on: 08/31/2020 11:09 AM   Modules accepted: Orders

## 2020-08-31 NOTE — Addendum Note (Signed)
Addended by: Mady Haagensen on: 08/31/2020 01:14 PM   Modules accepted: Orders

## 2020-08-31 NOTE — Telephone Encounter (Signed)
FL2 is complete and ready for pickup.

## 2020-08-31 NOTE — Telephone Encounter (Signed)
error 

## 2020-09-04 ENCOUNTER — Other Ambulatory Visit: Payer: Self-pay

## 2020-09-04 ENCOUNTER — Encounter: Payer: Self-pay | Admitting: Internal Medicine

## 2020-09-04 ENCOUNTER — Ambulatory Visit: Payer: Medicare PPO | Admitting: Internal Medicine

## 2020-09-04 VITALS — BP 100/60 | HR 94 | Ht 61.0 in | Wt 96.0 lb

## 2020-09-04 DIAGNOSIS — R627 Adult failure to thrive: Secondary | ICD-10-CM | POA: Diagnosis not present

## 2020-09-04 DIAGNOSIS — I350 Nonrheumatic aortic (valve) stenosis: Secondary | ICD-10-CM

## 2020-09-04 DIAGNOSIS — M65331 Trigger finger, right middle finger: Secondary | ICD-10-CM

## 2020-09-04 DIAGNOSIS — J449 Chronic obstructive pulmonary disease, unspecified: Secondary | ICD-10-CM

## 2020-09-04 DIAGNOSIS — R630 Anorexia: Secondary | ICD-10-CM

## 2020-09-04 DIAGNOSIS — R531 Weakness: Secondary | ICD-10-CM

## 2020-09-04 DIAGNOSIS — R634 Abnormal weight loss: Secondary | ICD-10-CM | POA: Diagnosis not present

## 2020-09-04 DIAGNOSIS — M65341 Trigger finger, right ring finger: Secondary | ICD-10-CM | POA: Diagnosis not present

## 2020-09-04 NOTE — Patient Instructions (Signed)
Continue current medications.  Checking for iron deficiency.  These labs are pending.  Further instructions to follow.  Continue current medications.  Follow-up with Dr. Lamonte Sakai April 6 and follow-up with me for Medicare wellness visit in June.

## 2020-09-04 NOTE — Progress Notes (Signed)
Subjective:    Patient ID: Katherine [name], female    DOB: January 14, 1929, 87 y.o.   MRN: [id number]  HPI 87 year old Female seen today for follow-up of multiple medical issues accompanied by her husband.  Last week her husband called and said that he and patient's daughters would be discussing whether or not patient could remain in her own home.  They live in a two-level condo with the bedroom upstairs.  He is afraid that patient may have a terrible fall and he would feel responsible.  He has looked into long-term care facility such as [facility] for the both of them but he is not ready to move to such a facility he says.  He still working 2 mornings a week at [employer].  That gets him out of the house.  During that time patient's granddaughter comes and stays a few hours with her doing some light housework etc.  Patient went to her first and person AA meeting in over 2 years last evening.  Says she is a bit tired from that activity today.  However she looks well dressed and is alert.  Says her appetite has improved a bit.  Her weight however is 96 pounds  Her hemoglobin has dropped from 13 g in June to 10.8 g in February and is now 10.5 g.  Checking iron and iron binding capacity.  Do not think she is taking iron supplement.  Had complained of some constant nausea but that seems to be improved.  Appetite is fair.  Has had no recent falls.  Breathing is stable at the present time with little activity.  A month ago her weight was 99 pounds.  We have discussed the importance of keeping her weight stable.  However the good news is the nausea has improved and she has not really had to take much in her mouth at all.  Has upcoming appointment with Pulmonologist on April 6.  Dr. Burney Gauze has her in a splint for right third and fourth trigger fingers but she does not always wear the splint if she should according to her husband.  Sometimes she takes it off which frustrates him.  She is alert.  She knows  me.  Husband says sometimes she is mildly confused.  We are adding iron and iron binding capacity as well as ferritin due to anemia.  She is being seen by  Palliative care  Review of Systems see above     Objective:   Physical Exam Blood pressure 100/60 pulse 94 pulse oximetry 93% weight 96 pounds height 5 feet 1 inches BMI 18.14 Her chest is clear to auscultation.  Cardiac exam regular rate and rhythm harsh 3/6 holosystolic ejection murmur.  No lower extremity edema.  Affect is quiet.  She is cooperative.  No lower extremity edema.      Assessment & Plan:  Normocytic anemia-likely iron deficiency-iron levels are pending.  Hemoglobin 10.5 g with MCV 88.4.  Vitamin B12 and folate have been added as well as TSH, c-Met.  White blood cell count is normal.  Platelet count is normal.  MCV is normal.  Plan: We will review hematology labs that have been ordered.  Likely will need some iron supplementation.  Has an appointment in 3 months for Medicare wellness exam and health maintenance exam.  Has appointment with Dr. Lamonte Sakai April 6.  Discussion with husband privately today about moving to a single level home.  I think he would be less worried about  her falling.  He seems less inclined to move her to a long-term care facility today than he did last week when I spoke with him on the phone.

## 2020-09-05 LAB — VITAMIN B12: Vitamin B-12: 1380 pg/mL — ABNORMAL HIGH (ref 200–1100)

## 2020-09-05 LAB — COMPLETE METABOLIC PANEL WITH GFR
AG Ratio: 1.1 (calc) (ref 1.0–2.5)
ALT: 9 U/L (ref 6–29)
AST: 21 U/L (ref 10–35)
Albumin: 3 g/dL — ABNORMAL LOW (ref 3.6–5.1)
Alkaline phosphatase (APISO): 71 U/L (ref 37–153)
BUN/Creatinine Ratio: 13 (calc) (ref 6–22)
BUN: 14 mg/dL (ref 7–25)
CO2: 28 mmol/L (ref 20–32)
Calcium: 8.9 mg/dL (ref 8.6–10.4)
Chloride: 106 mmol/L (ref 98–110)
Creat: 1.05 mg/dL — ABNORMAL HIGH (ref 0.60–0.88)
GFR, Est African American: 54 mL/min/{1.73_m2} — ABNORMAL LOW (ref >=60)
GFR, Est Non African American: 47 mL/min/{1.73_m2} — ABNORMAL LOW (ref >=60)
Globulin: 2.8 g/dL (calc) (ref 1.9–3.7)
Glucose, Bld: 78 mg/dL (ref 65–99)
Potassium: 4.6 mmol/L (ref 3.5–5.3)
Sodium: 142 mmol/L (ref 135–146)
Total Bilirubin: 0.3 mg/dL (ref 0.2–1.2)
Total Protein: 5.8 g/dL — ABNORMAL LOW (ref 6.1–8.1)

## 2020-09-05 LAB — CBC WITH DIFFERENTIAL/PLATELET
Absolute Monocytes: 454 cells/uL (ref 200–950)
Basophils Absolute: 78 cells/uL (ref 0–200)
Basophils Relative: 1.4 %
Eosinophils Absolute: 342 cells/uL (ref 15–500)
Eosinophils Relative: 6.1 %
HCT: 33.6 % — ABNORMAL LOW (ref 35.0–45.0)
Hemoglobin: 10.5 g/dL — ABNORMAL LOW (ref 11.7–15.5)
Lymphs Abs: 1204 cells/uL (ref 850–3900)
MCH: 27.6 pg (ref 27.0–33.0)
MCHC: 31.3 g/dL — ABNORMAL LOW (ref 32.0–36.0)
MCV: 88.4 fL (ref 80.0–100.0)
MPV: 10.8 fL (ref 7.5–12.5)
Monocytes Relative: 8.1 %
Neutro Abs: 3522 cells/uL (ref 1500–7800)
Neutrophils Relative %: 62.9 %
Platelets: 235 10*3/uL (ref 140–400)
RBC: 3.8 10*6/uL (ref 3.80–5.10)
RDW: 13.1 % (ref 11.0–15.0)
Total Lymphocyte: 21.5 %
WBC: 5.6 10*3/uL (ref 3.8–10.8)

## 2020-09-05 LAB — IRON,TIBC AND FERRITIN PANEL
%SAT: 31 % (calc) (ref 16–45)
Ferritin: 17 ng/mL (ref 16–288)
Iron: 96 ug/dL (ref 45–160)
TIBC: 306 mcg/dL (calc) (ref 250–450)

## 2020-09-05 LAB — FOLATE: Folate: 21.9 ng/mL

## 2020-09-05 LAB — TSH: TSH: 4.42 mIU/L (ref 0.40–4.50)

## 2020-09-16 DIAGNOSIS — I350 Nonrheumatic aortic (valve) stenosis: Secondary | ICD-10-CM | POA: Diagnosis not present

## 2020-09-16 DIAGNOSIS — R2689 Other abnormalities of gait and mobility: Secondary | ICD-10-CM | POA: Diagnosis not present

## 2020-09-16 DIAGNOSIS — R278 Other lack of coordination: Secondary | ICD-10-CM | POA: Diagnosis not present

## 2020-09-16 DIAGNOSIS — G9340 Encephalopathy, unspecified: Secondary | ICD-10-CM | POA: Diagnosis not present

## 2020-09-16 DIAGNOSIS — R1313 Dysphagia, pharyngeal phase: Secondary | ICD-10-CM | POA: Diagnosis not present

## 2020-09-17 DIAGNOSIS — M961 Postlaminectomy syndrome, not elsewhere classified: Secondary | ICD-10-CM | POA: Diagnosis not present

## 2020-09-17 DIAGNOSIS — M47818 Spondylosis without myelopathy or radiculopathy, sacral and sacrococcygeal region: Secondary | ICD-10-CM | POA: Diagnosis not present

## 2020-09-17 DIAGNOSIS — M4316 Spondylolisthesis, lumbar region: Secondary | ICD-10-CM | POA: Diagnosis not present

## 2020-09-17 DIAGNOSIS — M47816 Spondylosis without myelopathy or radiculopathy, lumbar region: Secondary | ICD-10-CM | POA: Diagnosis not present

## 2020-09-18 DIAGNOSIS — M79641 Pain in right hand: Secondary | ICD-10-CM | POA: Diagnosis not present

## 2020-09-18 DIAGNOSIS — S63659A Sprain of metacarpophalangeal joint of unspecified finger, initial encounter: Secondary | ICD-10-CM | POA: Diagnosis not present

## 2020-09-18 DIAGNOSIS — M65331 Trigger finger, right middle finger: Secondary | ICD-10-CM | POA: Diagnosis not present

## 2020-09-19 ENCOUNTER — Ambulatory Visit: Payer: Medicare PPO | Admitting: Emergency Medicine

## 2020-09-19 ENCOUNTER — Encounter: Payer: Self-pay | Admitting: Emergency Medicine

## 2020-09-19 ENCOUNTER — Other Ambulatory Visit: Payer: Self-pay

## 2020-09-19 DIAGNOSIS — R911 Solitary pulmonary nodule: Secondary | ICD-10-CM

## 2020-09-19 DIAGNOSIS — J449 Chronic obstructive pulmonary disease, unspecified: Secondary | ICD-10-CM | POA: Diagnosis not present

## 2020-09-19 MED ORDER — ALBUTEROL SULFATE HFA 108 (90 BASE) MCG/ACT IN AERS: 2.0000 | INHALATION_SPRAY | Freq: Four times a day (QID) | RESPIRATORY_TRACT | 4 refills | Status: DC | PRN

## 2020-09-19 NOTE — Progress Notes (Signed)
Subjective:    Patient ID: Katherine [name], female    DOB: 1929/05/28, 87 y.o.   MRN: [id number]  HPI  ROV 04/18/20 --87 year old woman who carries a history of severe COPD and upper airway irritation syndrome with associated chronic cough.  She also has a history of a positive PPD.  She has been on chronic prednisone in the past, not currently.  Underlying contributors to her upper airway irritation include GERD.  Last visit she did a walking oximetry, completed half a lap and then had to sit down due to weakness in her legs.  She did not desaturate.  She is using albuterol frequently through the day. She is using DuoNeb 2-3 times a day, pulmicort bid. She is not coughing much right now. She is still taking pepcid in the evening. She believes that she started back on prednisone once a day when she filled her last meds - they don't know the dose. Has been back on it for about a week.   ROV 09/19/20 --Katherine [name] is 43 and has a history of severe COPD, remote positive PPD, chronic cough with upper airway irritation syndrome in the setting of GERD, rhinitis.  She is supposed to be on scheduled DuoNebs 4 times daily, budesonide twice daily.  Uses albuterol as needed.  She has been on chronic prednisone in the past but I stop this.  They report that she has been doing fairly well. She has not been doing her nebs qid, more like 1-2x a day. She has not required any abx or pred. She is using albuterol HFA a few times a day.   She has a 6 mm right lower lobe pulmonary nodule initially found 02/07/2019, stable January 2021.  Repeat CT scan performed on 08/10/2020 reviewed by me showed no change in the nodule, new mild bandlike scarring in the apical right upper lobe of unclear significance, severe emphysema.   06/22/18:  FEV1 0.94 (66) FVC 1.72 (89) Ratio 54% RV 131% TLC 106% DLCO/VA 40 % pred   Review of Systems  Constitutional: Negative for activity change, fatigue and unexpected weight change.  HENT:  Positive for postnasal drip and voice change.   Eyes: Negative.   Respiratory: Positive for shortness of breath.   Cardiovascular: Negative.   Gastrointestinal: Negative.   Endocrine: Negative.   Genitourinary: Negative.   Musculoskeletal: Negative for back pain and joint swelling.  Skin: Negative.   Allergic/Immunologic: Negative.   Neurological: Positive for weakness.  Hematological: Negative.   Psychiatric/Behavioral: Negative.         Objective:   Physical Exam Vitals:   09/19/20 1505  BP: 104/62  Pulse: 94  Temp: 97.6 F (36.4 C)  TempSrc: Temporal  SpO2: 94%  Weight: 92 lb (41.7 kg)  Height: 5' 1.5" (1.562 m)   Gen: Pleasant, thin elderly woman, in no distress,  normal affect  ENT: No lesions,  mouth clear,  oropharynx clear, no postnasal drip  Neck: No JVD, no stridor  Lungs: No use of accessory muscles, distant, no wheeze on forced exp  Cardiovascular: RRR, holosystolic murmur  Musculoskeletal: No deformities, no cyanosis or clubbing  Neuro: alert, awake, non focal  Skin: Warm, no lesions or rash      Assessment & Plan:  COPD (chronic obstructive pulmonary disease) Overall stable.  She is able to move slowly but does still remain active.  She still is not using her DuoNeb on a 4 times daily schedule, uses it more like once or twice daily.  Unclear how often she is using the budesonide.  She does use her albuterol HFA fairly frequently because of ease of access.  She has not had any exacerbations, prednisone, antibiotics.  I have encouraged them to start using the DuoNeb 4 times a day to help her overall breathing, also ensuring that her budesonide is twice daily.  They are going to work on this.  COVID-19 vaccine is up-to-date  Lung nodule 6 mm pulmonary nodule that has been stable on serial CT scans between 02/07/2019 and 08/10/2020.  Given her age, the risk associated with any kind of invasive testing, and the stability for over 1.5 years, I do not believe  there is significant benefit to continuing to repeat her CT scan of the chest.  I will not plan to follow the nodule further unless there is a significant clinical change.  Baltazar Apo, MD, PhD 09/19/2020, 4:45 PM [facility] [phone] or if no answer [phone]

## 2020-09-19 NOTE — Patient Instructions (Signed)
Please use your DuoNeb (ipratropium/albuterol) nebulizers 4 times a day on a schedule. Please use Pulmicort (budesonide) nebulizer 2 times a day on a schedule. You can use your albuterol inhaler 2 puffs up to every 4 hours if you need it for shortness of breath, chest tightness, wheezing. Your CT scan of the chest from February was stable.  Your pulmonary nodule has not changed and you should not need any further scans to follow this. Follow with Dr Lamonte Sakai in 6 months or sooner if you have any problems

## 2020-09-19 NOTE — Assessment & Plan Note (Signed)
Overall stable.  She is able to move slowly but does still remain active.  She still is not using her DuoNeb on a 4 times daily schedule, uses it more like once or twice daily.  Unclear how often she is using the budesonide.  She does use her albuterol HFA fairly frequently because of ease of access.  She has not had any exacerbations, prednisone, antibiotics.  I have encouraged them to start using the DuoNeb 4 times a day to help her overall breathing, also ensuring that her budesonide is twice daily.  They are going to work on this.  COVID-19 vaccine is up-to-date

## 2020-09-19 NOTE — Assessment & Plan Note (Signed)
6 mm pulmonary nodule that has been stable on serial CT scans between 02/07/2019 and 08/10/2020.  Given her age, the risk associated with any kind of invasive testing, and the stability for over 1.5 years, I do not believe there is significant benefit to continuing to repeat her CT scan of the chest.  I will not plan to follow the nodule further unless there is a significant clinical change.

## 2020-09-25 ENCOUNTER — Other Ambulatory Visit: Payer: Self-pay | Admitting: Emergency Medicine

## 2020-09-25 ENCOUNTER — Other Ambulatory Visit: Payer: Self-pay

## 2020-09-25 ENCOUNTER — Other Ambulatory Visit: Payer: Medicare PPO | Admitting: Nurse Practitioner

## 2020-09-25 DIAGNOSIS — Z515 Encounter for palliative care: Secondary | ICD-10-CM | POA: Diagnosis not present

## 2020-09-25 DIAGNOSIS — K5903 Drug induced constipation: Secondary | ICD-10-CM | POA: Diagnosis not present

## 2020-09-25 NOTE — Progress Notes (Signed)
[facility] Consult Note Telephone: [phone]  Fax: [phone]  PATIENT NAME: Katherine [name] [id number] [phone] (home)  DOB: [birth year] MRN: [id number]  PRIMARY CARE PROVIDER:    Elby Showers, MD,  [address] [place] [address] [phone]  REFERRING PROVIDER:   Elby Showers, MD [address] [place],  [place] [address] [phone]  RESPONSIBLE PARTY:   Extended Emergency Contact Information Primary Emergency Contact: Age,[name] Address: [address]          [place], [place] [address] [place]nette Litter of [place] Phone: [phone] Mobile Phone: [phone] Relation: Spouse Secondary Emergency Contact: Sunday Shams Mobile Phone: [phone] Relation: Granddaughter  I met face to face with patient and husband in home.  ASSESSMENT AND RECOMMENDATIONS:   Advance care Planning:  Goal of care: Patient's goal of care is function. Directives: Signed DNR and MOST forms present in home, copy on [facility] EMR.  Symptom Management/Plan: Constipation: increase Senokot to 2 tabs by mouth daily. Hold for diarrhea. Pain: Patient report adequate pain relief with current regimen of Tramadol 48m twice a day and Tylenol 6528min between as needed.  COPD: Condition is stable. Patient was seen last week by her pulmonologist Dr. ByLamonte Sakaiith no change in her plan of care. Patient to continue DuoNeb 4 times daily and Budesonide inhaler twice a day. Patient's husband asked if he could get personal caregiver through palliative care to assist in the care of patient, family made aware that Palliative care does not provide personal care giver. He was however made aware that palliative care can provide him a list of providers of personal caregivers if needed. Husband said he would let me know when he confirms that insurance would cover the cost.   Needs list of caregivers  when ready  Follow up Palliative Care Visit: Palliative care will continue to follow for complex decision making and symptom management. Return 6 weeks or prn.  Family /Caregiver/Community Supports: Patient lives at home with her husband. Her husband is her main care giver.  Cognitive/Functionaldecline: Patientawake,alert,andcoherent, able to complete her ADLs with moderate assist, ambulates with a Rolator walker, activity intolerance.   CHIEF COMPLAINT: Constipation  History obtained from review of EMR and discussion with patient and her husband. Records reviewed and summarized bellow.  HISTORY OF PRESENT ILLNESS:Katherine [name] a 89.o.year old femalewith multiple medical problems including COPD, Right lung nodule (stable on serial CT scans, between 02/07/2019 and 08/10/2020), chronic back pain related to L1 compression fracture, aortic stenosis with mild regurgitation, anxiety/depression. Patient with report of ongoing constipation, she was started on Senokot 8.63m5maily at last visit 6 weeks ago. Today she report moderate relief of symptoms. Report having bowel movement every other day, report some difficulty evacuating stool. Denied rectal bleed, denied abdominal pain, denied nausea or vomiting. Right arm fracture from mechanical fall healing  without report of uncontrolled pain today. Palliative Care was asked to help address advance care planning,goals of care, and symptoms management. This is a follow up visit from 08/10/2020.  CODE STATUS: DNR  PPS: 60%  HOSPICE ELIGIBILITY/DIAGNOSIS: TBD  Physical Exam: Constitutional: NAD General: frail appearing, thin CV: no LE edema Pulmonary: no increased work of breathing, no acute cough, no audible wheezes, room air [id number] ROM in all extremities, ambulatory, Hand brace on right hand intact Skin: warm and dry, no rashes or wounds on visible skin Neuro: weakness, otherwise non focal Psych: non-anxious  affecttoday,  A and O x 3 Hem/lymph/immuno: no widespread bruising  Reviewed CT scan report on 08/09/2020 Reviewed Lab result 08/03/2020 Cr 1.10, GFR 48, Hgb 10.8, Hct 34.5   PAST MEDICAL HISTORY:  Past Medical History:  Diagnosis Date  . Alcoholic ([place])    36 years sober  . Allergy   . Anemia   . Arthritis   . Asthma    patient denies  . B12 deficiency   . COPD (chronic obstructive pulmonary disease) ([facility])   . Depression    not recent  . Diverticulosis   . Esophageal stricture   . Headache    cluster headaches occasionally  . Hepatic cyst 01/30/10  . Hiatal hernia 1985   patient unaware  . Hx of adenomatous colonic polyps 2006  . Hyperlipidemia   . IBS (irritable bowel syndrome)   . PONV (postoperative nausea and vomiting)    no nausea or vomitting after surgery 07/2014  . Positive PPD   . PUD (peptic ulcer disease)   . Shortness of breath dyspnea    SOB WITH EXERTION   (NOT NEW)    SOCIAL HX:  Social History   Tobacco Use  . Smoking status: Former Smoker    Years: 60.00    Types: Cigarettes    Quit date: 02/02/2010    Years since quitting: 10.6  . Smokeless tobacco: Never Used  Substance Use Topics  . Alcohol use: No    Comment: recovering alcoholic     35 YRS     FAMILY HX:  Family History  Problem Relation Age of Onset  . Ulcers Father   . Stroke Father   . Irritable bowel syndrome Father   . Ovarian cancer Sister   . Colon cancer Neg Hx    ALLERGIES: No Known Allergies    PERTINENT MEDICATIONS:  . albuterol (VENTOLIN HFA) 108 (90 Base) MCG/ACT inhaler INHALE 2 PUFFS INTO THE LUNGS 4 (FOUR) TIMES DAILY AS NEEDED FOR WHEEZING OR SHORTNESS OF BREATH.  Marland Kitchen ALPRAZolam (XANAX) 0.25 MG tablet Take one tab up to twice daily as needed for anxiety and sleep  . escitalopram (LEXAPRO) 5 MG tablet TAKE 1 TABLET BY MOUTH EVERY DAY      . ipratropium-albuterol (DUONEB) 0.5-2.5 (3) MG/3ML SOLN Take 3 mLs by nebulization every 6 (six) hours.  . promethazine  (PHENERGAN) 12.5 MG tablet Take 1 tablet (12.5 mg total) by mouth every 8 (eight) hours as needed for nausea or vomiting.  . traMADol (ULTRAM) 50 MG tablet Take 50 mg by mouth every 6 (six) hours as needed for moderate pain.   . traZODone (DESYREL) 50 MG tablet TAKE 3 TABLETS BY MOUTH AT BEDTIME.   Thank you for the opportunity to participate in the care of Ms. Pestka. The palliative care team will continue to follow. Please call our office at [phone] if we can be of additional assistance.  Jari Favre, DNP, AGPCNP-BC

## 2020-10-16 DIAGNOSIS — S63659A Sprain of metacarpophalangeal joint of unspecified finger, initial encounter: Secondary | ICD-10-CM | POA: Diagnosis not present

## 2020-10-21 ENCOUNTER — Other Ambulatory Visit: Payer: Self-pay | Admitting: Internal Medicine

## 2020-10-29 ENCOUNTER — Telehealth: Payer: Self-pay | Admitting: Internal Medicine

## 2020-10-29 ENCOUNTER — Encounter: Payer: Self-pay | Admitting: Internal Medicine

## 2020-10-29 ENCOUNTER — Other Ambulatory Visit: Payer: Self-pay

## 2020-10-29 DIAGNOSIS — R300 Dysuria: Secondary | ICD-10-CM

## 2020-10-29 DIAGNOSIS — R350 Frequency of micturition: Secondary | ICD-10-CM

## 2020-10-29 MED ORDER — CIPROFLOXACIN HCL 250 MG PO TABS: 250.0000 mg | ORAL_TABLET | Freq: Two times a day (BID) | ORAL | 0 refills | Status: DC

## 2020-10-29 NOTE — Telephone Encounter (Signed)
Spoke with daughter. She will come by and pick up specimen cup for u/a and culture

## 2020-10-29 NOTE — Telephone Encounter (Signed)
Pt daughter calling and said that pt took a test and it seems she has a bladder infection and wanted to know if you could prescribe something for it, she said since shes 89 its hard to get her to and from the doctors office, daughter call back [phone]

## 2020-10-29 NOTE — Telephone Encounter (Signed)
Katherine [name] is complaining of dysuria and frequency. Her daughter Katherine [name] called. Her phone is [phone]. She currently is staying with Katherine [name] as Katherine [name] has been in rehab for pelvic and acetabular fracture due to a fall.  Katherine [name] will pick up urine container for specimen which will be obtained by clean catch. MJB, MD  Patient has no fever or chills. No nausea or vomiting per daughter, Patient has severe COPD requiring oxygen and it is not convenient for her to travel to office.

## 2020-10-30 LAB — URINE CULTURE
MICRO NUMBER:: 11894292
SPECIMEN QUALITY:: ADEQUATE

## 2020-10-30 LAB — URINALYSIS, MICROSCOPIC ONLY
Bacteria, UA: NONE SEEN /HPF
RBC / HPF: NONE SEEN /HPF (ref 0–2)

## 2020-11-07 ENCOUNTER — Other Ambulatory Visit: Payer: Medicare PPO | Admitting: Nurse Practitioner

## 2020-11-07 ENCOUNTER — Other Ambulatory Visit: Payer: Self-pay

## 2020-11-07 VITALS — BP 118/48 | HR 89

## 2020-11-07 DIAGNOSIS — R062 Wheezing: Secondary | ICD-10-CM | POA: Diagnosis not present

## 2020-11-07 DIAGNOSIS — T148XXA Other injury of unspecified body region, initial encounter: Secondary | ICD-10-CM | POA: Diagnosis not present

## 2020-11-07 DIAGNOSIS — K5901 Slow transit constipation: Secondary | ICD-10-CM | POA: Diagnosis not present

## 2020-11-07 DIAGNOSIS — Z515 Encounter for palliative care: Secondary | ICD-10-CM

## 2020-11-07 NOTE — Progress Notes (Signed)
[facility] Consult Note Telephone: [phone]  Fax: [phone]    Date of encounter: 11/07/20 PATIENT NAME: Katherine [name] [place] [id number]   [phone] (home)  DOB: [birth year] MRN: [id number]  PRIMARY CARE PROVIDER:    Elby Showers, MD,  [address] [place] [address] [phone]  REFERRING PROVIDER:   Elby Showers, MD [address] [place],  [place] [address] [phone]  RESPONSIBLE PARTY:    Contact Information    Name Relation Home Work Mobile   Katherine [name] [phone]  [phone]   Katherine [name]   [phone]     I met face to face with patient and family in home. Palliative Care was asked to follow this patient by consultation request of  Katherine [name], Katherine Lick, MD to address advance care planning and complex medical decision making. This is a follow up visit.                                  ASSESSMENT AND PLAN / RECOMMENDATIONS:   Advance Care Planning/Goals of Care:  CODE STATUS: DNR Goal of care: Patient's goal of care is function. Directives: Signed DNR and MOST forms present in home, copy on [facility] EMR. Details of MOST form include comfort measures, determine use or limitation of antibiotics when infection occurs, IV fluids for a defined trial period, no feeding tube.  Symptom Management/Plan: Wheezing: Patient report no wheezing today.  Patient not using DuoNeb every 6hrs as prescribed, report not using any today. She report using Albuterol inhaler t2-3 times a day most days. Discussed role of DuoNeb in relieving her wheezing, educated patient that the Nebulizer helps by  relieving airway narrowing (bronchospasm) that occurs with chronic obstructive pulmonary disease (COPD).  Constipation: Patient report not taking 2 tablets of Senokot 8.62m as recommended at last visit, report only taking one tablet daily. Report last bowel  movement was today after no bowel movement in 3 days. She denied rectal bleed, denied rectal pain, denied abdominal pain or bloating. Discussed importance of regular bowel movements to prevent stool impaction and associated complications. Patient verbalized understanding. Bruise: Bruise to dorsal foot from dog scratch. Wound with signs of healing, no drainage, mild redness, no tenderness. Patient advised to notify PCP if wound starts draining or becomes painful or swollen. Provided general support and encouragement, no other unmet needs identified at this time. Questions and concerns were addressed. Patient and family was encouraged to call with questions and/or concerns.  Follow up Palliative Care Visit: Palliative care will continue to follow for complex medical decision making, advance care planning, and clarification of goals. Return in about 6 weeks or prn.  PPS: 60%  HOSPICE ELIGIBILITY/DIAGNOSIS: TBD  CHIEF COMPLAIN: Audible wheeze at rest  History obtained from review of [facility] EMR, discussion Katherine [name] and her family.  HISTORY OF PRESENT ILLNESS:Katherine [name] [id number].year old femalewith multiple medical problems including COPD, Right lung nodule (stable on serial CT scans, between 02/07/2019 and 08/10/2020), chronic back pain related to L1 compression fracture, aortic stenosis with mild regurgitation, anxiety/depression.  Patient with report of audible wheeze in te context of COPD. Patient report wheeze is worse in the morning, report none today. Condition is not associated with shortness of breath and not aggravated by activity. Report wheezing is relieved with use of her Nebulizer which report using occasionally.  She denied fever, denied chills,  denied shortness of breath at rest, denied chest pain. Patient endorsed constipation, last bowel movement 3 days ago. Ten systems reviewed and are negative for acute change, except as noted in the HPI.   I reviewed available labs,  medications, imaging, studies and related documents from the EMR.  Records reviewed and summarized above.   Vitals:   11/07/20 1530  BP: (!) 118/48  Pulse: 89  SpO2: 93%   Physical Exam: General: frail appearing, Cachetic, sitting in recliner in NAD CV: no LE edema Pulmonary: no increased work of breathing, noacutecough, no wheezes, room air [id number], decreased ROM in all extremities, ambulatory, finger brace on right hand intact Skin: warm and dry, no rashes, healing bruise noted to right foot Neuro:weakness, otherwise non focal Psych: non-anxious affecttoday, A and Ox 3 Hem/lymph/immuno: no widespread bruising  Past Medical History:  Diagnosis Date  . Alcoholic ([place])    36 years sober  . Allergy   . Anemia   . Arthritis   . Asthma    patient denies  . B12 deficiency   . COPD (chronic obstructive pulmonary disease) ([facility])   . Depression    not recent  . Diverticulosis   . Esophageal stricture   . Headache    cluster headaches occasionally  . Hepatic cyst 01/30/10  . Hiatal hernia 1985   patient unaware  . Hx of adenomatous colonic polyps 2006  . Hyperlipidemia   . IBS (irritable bowel syndrome)   . PONV (postoperative nausea and vomiting)    no nausea or vomitting after surgery 07/2014  . Positive PPD   . PUD (peptic ulcer disease)   . Shortness of breath dyspnea    SOB WITH EXERTION   (NOT NEW)   Thank you for the opportunity to participate in the care of [name].  The palliative care team will continue to follow. Please call our office at [phone] if we can be of additional assistance.   Katherine Favre, DNP, AGPCNP-BC  COVID-19 PATIENT SCREENING TOOL Asked and negative response unless otherwise noted:   Have you had symptoms of covid, tested positive or been in contact with someone with symptoms/positive test in the past 5-10 days?

## 2020-11-09 ENCOUNTER — Other Ambulatory Visit: Payer: Self-pay | Admitting: Internal Medicine

## 2020-11-13 DIAGNOSIS — S63659A Sprain of metacarpophalangeal joint of unspecified finger, initial encounter: Secondary | ICD-10-CM | POA: Diagnosis not present

## 2020-11-13 DIAGNOSIS — M79641 Pain in right hand: Secondary | ICD-10-CM | POA: Diagnosis not present

## 2020-11-15 DIAGNOSIS — S63659A Sprain of metacarpophalangeal joint of unspecified finger, initial encounter: Secondary | ICD-10-CM | POA: Diagnosis not present

## 2020-11-15 DIAGNOSIS — M65351 Trigger finger, right little finger: Secondary | ICD-10-CM | POA: Diagnosis not present

## 2020-11-15 DIAGNOSIS — M79641 Pain in right hand: Secondary | ICD-10-CM | POA: Diagnosis not present

## 2020-11-15 DIAGNOSIS — S63659D Sprain of metacarpophalangeal joint of unspecified finger, subsequent encounter: Secondary | ICD-10-CM | POA: Diagnosis not present

## 2020-11-15 DIAGNOSIS — M65331 Trigger finger, right middle finger: Secondary | ICD-10-CM | POA: Diagnosis not present

## 2020-11-27 ENCOUNTER — Other Ambulatory Visit: Payer: Medicare PPO | Admitting: Internal Medicine

## 2020-11-27 ENCOUNTER — Other Ambulatory Visit: Payer: Self-pay

## 2020-11-27 DIAGNOSIS — D649 Anemia, unspecified: Secondary | ICD-10-CM | POA: Diagnosis not present

## 2020-11-27 DIAGNOSIS — Z9889 Other specified postprocedural states: Secondary | ICD-10-CM

## 2020-11-27 DIAGNOSIS — G4700 Insomnia, unspecified: Secondary | ICD-10-CM

## 2020-11-27 DIAGNOSIS — I350 Nonrheumatic aortic (valve) stenosis: Secondary | ICD-10-CM

## 2020-11-27 DIAGNOSIS — M549 Dorsalgia, unspecified: Secondary | ICD-10-CM

## 2020-11-27 DIAGNOSIS — F32A Depression, unspecified: Secondary | ICD-10-CM

## 2020-11-27 DIAGNOSIS — Z0000 Encounter for general adult medical examination without abnormal findings: Secondary | ICD-10-CM

## 2020-11-27 DIAGNOSIS — I700 Atherosclerosis of aorta: Secondary | ICD-10-CM | POA: Diagnosis not present

## 2020-11-27 DIAGNOSIS — R531 Weakness: Secondary | ICD-10-CM | POA: Diagnosis not present

## 2020-11-27 DIAGNOSIS — J449 Chronic obstructive pulmonary disease, unspecified: Secondary | ICD-10-CM | POA: Diagnosis not present

## 2020-11-27 DIAGNOSIS — Z87891 Personal history of nicotine dependence: Secondary | ICD-10-CM

## 2020-11-27 DIAGNOSIS — R627 Adult failure to thrive: Secondary | ICD-10-CM | POA: Diagnosis not present

## 2020-11-27 DIAGNOSIS — G8929 Other chronic pain: Secondary | ICD-10-CM

## 2020-11-27 DIAGNOSIS — R630 Anorexia: Secondary | ICD-10-CM

## 2020-11-27 NOTE — Addendum Note (Signed)
Addended by: Mady Haagensen on: 11/27/2020 04:59 PM   Modules accepted: Orders

## 2020-11-28 LAB — CBC WITH DIFFERENTIAL/PLATELET
Absolute Monocytes: 490 cells/uL (ref 200–950)
Basophils Absolute: 71 cells/uL (ref 0–200)
Basophils Relative: 1.2 %
Eosinophils Absolute: 301 cells/uL (ref 15–500)
Eosinophils Relative: 5.1 %
HCT: 34.5 % — ABNORMAL LOW (ref 35.0–45.0)
Hemoglobin: 10.6 g/dL — ABNORMAL LOW (ref 11.7–15.5)
Lymphs Abs: 1693 cells/uL (ref 850–3900)
MCH: 27.8 pg (ref 27.0–33.0)
MCHC: 30.7 g/dL — ABNORMAL LOW (ref 32.0–36.0)
MCV: 90.6 fL (ref 80.0–100.0)
MPV: 10.8 fL (ref 7.5–12.5)
Monocytes Relative: 8.3 %
Neutro Abs: 3345 cells/uL (ref 1500–7800)
Neutrophils Relative %: 56.7 %
Platelets: 223 10*3/uL (ref 140–400)
RBC: 3.81 10*6/uL (ref 3.80–5.10)
RDW: 13.5 % (ref 11.0–15.0)
Total Lymphocyte: 28.7 %
WBC: 5.9 10*3/uL (ref 3.8–10.8)

## 2020-11-28 LAB — COMPLETE METABOLIC PANEL WITH GFR
AG Ratio: 1.4 (calc) (ref 1.0–2.5)
ALT: 10 U/L (ref 6–29)
AST: 24 U/L (ref 10–35)
Albumin: 3.4 g/dL — ABNORMAL LOW (ref 3.6–5.1)
Alkaline phosphatase (APISO): 58 U/L (ref 37–153)
BUN/Creatinine Ratio: 18 (calc) (ref 6–22)
BUN: 18 mg/dL (ref 7–25)
CO2: 27 mmol/L (ref 20–32)
Calcium: 9.2 mg/dL (ref 8.6–10.4)
Chloride: 108 mmol/L (ref 98–110)
Creat: 0.99 mg/dL — ABNORMAL HIGH (ref 0.60–0.88)
GFR, Est African American: 58 mL/min/{1.73_m2} — ABNORMAL LOW (ref >=60)
GFR, Est Non African American: 50 mL/min/{1.73_m2} — ABNORMAL LOW (ref >=60)
Globulin: 2.5 g/dL (calc) (ref 1.9–3.7)
Glucose, Bld: 97 mg/dL (ref 65–99)
Potassium: 3.7 mmol/L (ref 3.5–5.3)
Sodium: 143 mmol/L (ref 135–146)
Total Bilirubin: 0.3 mg/dL (ref 0.2–1.2)
Total Protein: 5.9 g/dL — ABNORMAL LOW (ref 6.1–8.1)

## 2020-11-28 LAB — TEST AUTHORIZATION

## 2020-11-28 LAB — LIPID PANEL
Cholesterol: 167 mg/dL (ref <200)
HDL: 62 mg/dL (ref >=50)
LDL Cholesterol (Calc): 86 mg/dL (calc)
Non-HDL Cholesterol (Calc): 105 mg/dL (calc) (ref <130)
Total CHOL/HDL Ratio: 2.7 (calc) (ref <5.0)
Triglycerides: 101 mg/dL (ref <150)

## 2020-11-28 LAB — IRON,TIBC AND FERRITIN PANEL
%SAT: 7 % (calc) — ABNORMAL LOW (ref 16–45)
Ferritin: 12 ng/mL — ABNORMAL LOW (ref 16–288)
Iron: 24 ug/dL — ABNORMAL LOW (ref 45–160)
TIBC: 322 mcg/dL (calc) (ref 250–450)

## 2020-11-28 LAB — FOLATE: Folate: 18.2 ng/mL

## 2020-11-28 LAB — TSH: TSH: 2.71 mIU/L (ref 0.40–4.50)

## 2020-11-28 LAB — VITAMIN B12: Vitamin B-12: 663 pg/mL (ref 200–1100)

## 2020-11-29 ENCOUNTER — Ambulatory Visit (INDEPENDENT_AMBULATORY_CARE_PROVIDER_SITE_OTHER): Payer: Medicare PPO | Admitting: Internal Medicine

## 2020-11-29 ENCOUNTER — Encounter: Payer: Self-pay | Admitting: Internal Medicine

## 2020-11-29 ENCOUNTER — Other Ambulatory Visit: Payer: Self-pay

## 2020-11-29 VITALS — BP 110/80 | HR 100 | Ht 60.0 in | Wt 84.0 lb

## 2020-11-29 DIAGNOSIS — J449 Chronic obstructive pulmonary disease, unspecified: Secondary | ICD-10-CM

## 2020-11-29 DIAGNOSIS — M65331 Trigger finger, right middle finger: Secondary | ICD-10-CM | POA: Diagnosis not present

## 2020-11-29 DIAGNOSIS — F419 Anxiety disorder, unspecified: Secondary | ICD-10-CM

## 2020-11-29 DIAGNOSIS — R54 Age-related physical debility: Secondary | ICD-10-CM

## 2020-11-29 DIAGNOSIS — Z9889 Other specified postprocedural states: Secondary | ICD-10-CM

## 2020-11-29 DIAGNOSIS — I350 Nonrheumatic aortic (valve) stenosis: Secondary | ICD-10-CM

## 2020-11-29 DIAGNOSIS — F32A Depression, unspecified: Secondary | ICD-10-CM

## 2020-11-29 DIAGNOSIS — R630 Anorexia: Secondary | ICD-10-CM

## 2020-11-29 DIAGNOSIS — M65341 Trigger finger, right ring finger: Secondary | ICD-10-CM | POA: Diagnosis not present

## 2020-11-29 DIAGNOSIS — G4700 Insomnia, unspecified: Secondary | ICD-10-CM

## 2020-11-29 DIAGNOSIS — R110 Nausea: Secondary | ICD-10-CM

## 2020-11-29 DIAGNOSIS — Z8711 Personal history of peptic ulcer disease: Secondary | ICD-10-CM

## 2020-11-29 DIAGNOSIS — R531 Weakness: Secondary | ICD-10-CM

## 2020-11-29 DIAGNOSIS — R634 Abnormal weight loss: Secondary | ICD-10-CM

## 2020-11-29 DIAGNOSIS — R627 Adult failure to thrive: Secondary | ICD-10-CM

## 2020-11-29 DIAGNOSIS — M549 Dorsalgia, unspecified: Secondary | ICD-10-CM

## 2020-11-29 DIAGNOSIS — Z0000 Encounter for general adult medical examination without abnormal findings: Secondary | ICD-10-CM | POA: Diagnosis not present

## 2020-11-29 DIAGNOSIS — I700 Atherosclerosis of aorta: Secondary | ICD-10-CM

## 2020-11-29 DIAGNOSIS — G8929 Other chronic pain: Secondary | ICD-10-CM

## 2020-11-29 MED ORDER — POLYSACCHARIDE IRON COMPLEX 150 MG PO CAPS: 150.0000 mg | ORAL_CAPSULE | Freq: Two times a day (BID) | ORAL | 1 refills | Status: DC

## 2020-11-29 NOTE — Progress Notes (Signed)
Subjective:    Patient ID: Katherine [name], female    DOB: 12-23-1928, 87 y.o.   MRN: [id number]           87 year old Female accompanied by her daughter today for Annual medicare wellness and health maintenance exam. HPI   Weighed 96 pounds in March 2022. Not going out much. A bit unsteady on feet. Needs home health services. Husband has requested same company that they had previously. He has also suffered a fall and fracture of pelvis.  They live in a 2 story town home. They have one set of stairs and an elevator they can sit on to get upstairs. They have considered a one floor town home or an assisted living facility but thus far have not made any concrtete decisions. Her daughter will have to return to her home soon as her husband is ill. She lives several hours away from [place]. Granddaughter has helped some with housework but they are paying her to do this.   Appetite is poor and has been poor for some time. Suggest try Boost twice daily breakfast and lunch. Is iron deficient. Likely nutritional. Iron level is 24 ferritin is 12.Niferex capsules ordered with follow up in 3 months. Have ordered Desyrel 50 mg at bedtime which she has taken for years at higher dose, phenergan for nausea and low dose Lexapro for anxiety and depression. Also, she is requesting Alprazolam 0.25 mg up to twice daily as needed for anxiety.  She has a history of COPD and is followed by pulmonary.  In February 2016 she had posterior lumbar interbody fusion at L4-L5.  In October 2016 she had transforaminal lumbar interbody fusion L3-L4 and posterior lumbar fusion L3-S1.  In 2017 she had removal of hardware at L5-S1 but has had continuous back pain ever since.  She had x-rays of the lower back in December 2020 showing lumbar posterior fusion hardware around L3-L4.  She had anteriolisthesis L4 on L5 and had severe disc height loss at L5-S1.  Does she has reason to have chronic back pain.  Subsequently suffered a  compression fracture due to a fall at her home in January 2021.  She had a fall getting into bed striking bedside table.  Was diagnosed with an L1 compression fracture.  In February 2021 she was seen here for follow-up.  Husband indicated she had undergone kyphoplasty at L1 by radiologist in late January for pain relief after seeing her neurosurgeon, Dr. Saintclair Halsted.  Her weight was down to 86 pounds at that time and had been 109 pounds December 2020.  She was tried on Megace for appetite stimulant but this caused nausea for her and she has been unable to take it.  Has had some generalized nausea after Megace was stopped as well.  She has nausea medication at home.  In March 2021 was seen in the emergency department with altered mental status and decreased appetite.  Was sitting on the commode, felt weak and fell over.  She underwent CT of the brain with contrast and chest x-ray.  No stroke or rib fractures detected.  She had emphysematous changes on chest x-ray.  She was subsequently admitted to [facility].  She does like the care there.  She did not like the food there.  It was during the pandemic and it was an awful experience for her.  She came home in April 2021.  Her weight at that time was 95 pounds.  She was seen again May  2021 her weight was 89 pounds.  Discussion was held regarding skilled nursing care for physical therapy and gait strengthening.  We completed a form for admission to [facility] but the patient did not want to go to this facility.  She remained at home and continue to receive home physical therapy.  Was pleased with our services at that time.  She has a history of peptic ulcer disease, hyperlipidemia, depression and COPD.  She has been prescribed Trelegy for COPD.  Apparently ever uses her albuterol inhaler at times.  Does have nebulizer available and has albuterol nebulizer solution.  She had an exacerbation of COPD in June 2021 and was seen by pulmonary physicians  and treated with prednisone and doxycycline.  Intolerant of aspirin as it causes GI symptoms, Advil causes nausea as does Megace.  History of needle guided excision right breast calcification 2008 by Dr. Jennette Bill.  Appendectomy 1940.  Hysterectomy with right oophorectomy for endometriosis 1967.  Vagotomy and pyloroplasty 1975, benign left breast biopsy 1976, Bartholin cyst excised twice 1980 in 1982.  History of epidural steroid injections L5-S1 for back pain previously done by Dr. Niel Hummer.  Many years ago she suffered with alcoholism but has been in recovery for many years.  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  She does not abuse prescription pain medication and she has been in recovery for a long time.  She and her husband have worked with [employer] for many years.  Has remote history of peptic ulcer disease.  History of hyperlipidemia and depression.  In 2014 she was found to be anemic and iron deficient.  Upper GI endoscopy showed stricture with no bleeding.  At that time she had a strongly positive Hemoccult test.  Colonoscopy was incomplete due to tortuous colon.  Anemia improved with iron therapy.  Social history: She completed high school and 2 years of college.  2 adult daughters in good health. For well over 40 years.  This is her second marriage.  No children from second marriage.  She has 3 stepchildren.  Family history: Father died at age 8 with uremia.  Mother died at age 32.  53 sister died at age 54 with complications of alcoholism.  Another sister died at age 77 because of alcoholism and cancer.    Review of Systems If takes phenergan, gets drowsy. Right hand and wrist is in a brace.  Being seen by hand surgeon.  She had a distal radius fracture in 2017.  She has had pain and snapping involving the long and ring finger on her right hand.  Does have some left thumb pain and is left hand dominant.  Diagnosed by hand surgeon with acute rupture of the  sagittal bands of the long and ring finger right hand.  Surgery has been discussed but it is felt that she was not up to this at this point in time.  Mainly taking Tramadol in the morning for pain.    Objective:   Physical Exam Blood pressure 110/80 pulse 100 pulse oximetry 95% weight 84 pounds BMI 16.41.  She recognizes me.  Her voice is soft.  She looks frail.  Skin: Warm and dry.  No cervical adenopathy.  No thyromegaly.  No carotid bruits.  Chest is clear.  Cardiac exam: Regular rate and rhythm.  3/6 systolic ejection murmur.  Abdomen soft nondistended without masses.  No lower extremity edema.  Affect is slightly depressed.  She is cooperative.  Affect and judgment  appear to be normal.       Assessment & Plan:   Try 2  Trazadone tablets instead of 3 at bedtime.  Has antinausea medication  Is being placed on Niferex for iron deficiency with follow-up in a few weeks  History of peptic ulcer disease status post vagotomy  Moderate aortic stenosis with mild regurgitation-asymptomatic  Chronic low back pain status post multiple back procedures and history of L1 compression fracture due to a fall.  Needs home health services and PT.  Anorexia and weight loss-needs to be monitored carefully.  Megace caused nausea when it was given for appetite stimulant.  Has had low prealbumin.  Remote history of smoking-followed by pulmonary for COPD and has inhaler  Mild depression  Plan: Continue current medications and follow-up with office visit in 3 to 6 months.  Try to arrange for home health services and PT.  She and her husband have to decide what they are going to do with her living situation.  Currently they think they can get a little just fine but stairs are an issue for her and even though they have an elevator.  Subjective:   Patient presents for Medicare Annual/Subsequent preventive examination.  Review Past Medical/Family/Social: See above   Risk Factors  Current exercise  habits: Sedentary Dietary issues discussed: Needs more calories.  Does not tolerate appetite stimulant such as Megace due to nausea  Cardiac risk factors: Remote history of smoking  Depression Screen  (Note: if answer to either of the following is "Yes", a more complete depression screening is indicated)   Over the past two weeks, have you felt down, depressed or hopeless? No  Over the past two weeks, have you felt little interest or pleasure in doing things? No Have you lost interest or pleasure in daily life? No Do you often feel hopeless? No Do you cry easily over simple problems? No   Activities of Daily Living  In your present state of health, do you have any difficulty performing the following activities?:   Driving? Not driving  Managing money? No  Feeding yourself? No  Getting from bed to chair?  Yes Climbing a flight of stairs?  Yes Preparing food and eating?:  Yes Bathing or showering?  Yes Getting dressed: Sometimes Getting to the toilet? No  Using the toilet:No  Moving around from place to place: Yes In the past year have you fallen or had a near fall?:  Yes Are you sexually active? No  Do you have more than one partner? No   Hearing Difficulties: No  Do you often ask people to speak up or repeat themselves? No  Do you experience ringing or noises in your ears? No  Do you have difficulty understanding soft or whispered voices?  Yes Do you feel that you have a problem with memory?  Yes Do you often misplace items?  Sometimes   Home Safety:  Do you have a smoke alarm at your residence? Yes Do you have grab bars in the bathroom? Do you have throw rugs in your house?   Cognitive Testing  Alert? Yes Normal Appearance?Yes  Oriented to person? Yes  Place? Yes  Time? Yes  Recall of three objects?  Not tested Can perform simple calculations? Yes  Displays appropriate judgment?Yes  Can read the correct time from a watch face?Yes   List the Names of Other  Physician/Practitioners you currently use:  See referral list for the physicians patient is currently seeing.  Dr. Idolina Primer surgeon  [facility]  Dr. Saintclair Halsted is her neurosurgeon but has not seen him lately   Review of Systems:see above   Objective:     General appearance: Appears younger than stated age and frail Head: Normocephalic, without obvious abnormality, atraumatic  Eyes: conj clear, EOMi PEERLA  Ears: normal TM's and external ear canals both ears  Nose: Nares normal. Septum midline. Mucosa normal. No drainage or sinus tenderness.  Throat: lips, mucosa, and tongue normal; teeth and gums normal  Neck: no adenopathy, no carotid bruit, no JVD, supple, symmetrical, trachea midline and thyroid not enlarged, symmetric, no tenderness/mass/nodules  No CVA tenderness.  Lungs: clear to auscultation bilaterally  Breasts: Not examined Heart: regular rate and rhythm, S1, S2 normal, harsh murmur unchanged Abdomen: soft, non-tender; bowel sounds normal; no masses, no organomegaly  Musculoskeletal: Right hand is in a brace, no obvious deformity, generalized weakness ; back not examined Skin: Skin color, texture, turgor normal. No rashes or lesions  Lymph nodes: Cervical, supraclavicular, and axillary nodes normal.  Neurologic: CN 2 -12 Normal, Normal symmetric reflexes. Normal coordination and gait  Psych: Alert; Mood appears stable.    Assessment:    Annual wellness medicare exam   Plan:    During the course of the visit the patient was educated and counseled about appropriate screening and preventive services including:    Needs Covid booster     Patient Instructions (the written plan) was given to the patient.  Medicare Attestation  I have personally reviewed:  The patient's medical and social history  Their use of alcohol, tobacco or illicit drugs  Their current medications and supplements  The patient's functional ability including ADLs,fall risks, home safety  risks, cognitive, and hearing and visual impairment  Diet and physical activities  Evidence for depression or mood disorders  The patient's weight, height, BMI, and visual acuity have been recorded in the chart. I have made referrals, counseling, and provided education to the patient based on review of the above and I have provided the patient with a written personalized care plan for preventive services.

## 2020-12-03 ENCOUNTER — Other Ambulatory Visit: Payer: Self-pay | Admitting: Internal Medicine

## 2020-12-12 ENCOUNTER — Telehealth: Payer: Self-pay | Admitting: Internal Medicine

## 2020-12-12 NOTE — Telephone Encounter (Signed)
Faxed new orders to [facility] [phone], phone [phone], to go out and access [name] for [facility] needs.

## 2020-12-13 NOTE — Telephone Encounter (Signed)
[facility] called and said they need office note from 11/29/2020 to move forward with orders for Jo. They can see in Epic once completed.

## 2020-12-17 NOTE — Patient Instructions (Signed)
It was a pleasure to see you today.  We are placing you on iron supplementation and follow-up will be necessary in 8 to 12 weeks regarding anemia and iron deficiency.  We have requested home health services for you.

## 2020-12-20 DIAGNOSIS — M65331 Trigger finger, right middle finger: Secondary | ICD-10-CM | POA: Diagnosis not present

## 2020-12-20 DIAGNOSIS — M65351 Trigger finger, right little finger: Secondary | ICD-10-CM | POA: Diagnosis not present

## 2020-12-20 DIAGNOSIS — M79641 Pain in right hand: Secondary | ICD-10-CM | POA: Diagnosis not present

## 2020-12-20 DIAGNOSIS — S63659A Sprain of metacarpophalangeal joint of unspecified finger, initial encounter: Secondary | ICD-10-CM | POA: Diagnosis not present

## 2020-12-20 NOTE — Telephone Encounter (Signed)
Faxed office notes 11/29/2020 to [facility] [phone], phone [phone]

## 2020-12-21 ENCOUNTER — Other Ambulatory Visit: Payer: Self-pay | Admitting: Internal Medicine

## 2020-12-25 ENCOUNTER — Other Ambulatory Visit: Payer: Medicare PPO | Admitting: Nurse Practitioner

## 2020-12-25 ENCOUNTER — Other Ambulatory Visit: Payer: Self-pay

## 2020-12-25 VITALS — BP 116/54 | HR 80 | Resp 20

## 2020-12-25 DIAGNOSIS — R531 Weakness: Secondary | ICD-10-CM | POA: Diagnosis not present

## 2020-12-25 DIAGNOSIS — Z515 Encounter for palliative care: Secondary | ICD-10-CM

## 2020-12-25 DIAGNOSIS — R634 Abnormal weight loss: Secondary | ICD-10-CM

## 2020-12-25 NOTE — Progress Notes (Signed)
[facility] Palliative Care Consult Note Telephone: [phone]  Fax: [phone]    Date of encounter: 12/25/20 PATIENT NAME: Katherine [name] [address] [place] [address]   [phone] (home)  DOB: 06/08/1929 MRN: [id number]  PRIMARY CARE PROVIDER:    Elby Showers, MD,  [address] [place] [address] [phone]  REFERRING PROVIDER:   Elby Showers, MD [address] [place],  [place] [address] [phone]  RESPONSIBLE PARTY:    Contact Information     Name Relation Home Work Mobile   Brogan [phone]  [phone]   Maryruth Bun   [phone]     I met face to face with patient in home. Patient's husband present during visit.  Palliative Care was asked to follow this patient by consultation request of  Baxley, Cresenciano Lick, MD to address advance care planning and complex medical decision making. This is a follow up visit.                                   ASSESSMENT AND PLAN / RECOMMENDATIONS:   Advance Care Planning/Goals of Care: Goals include to maximize quality of life and symptom management.   CODE STATUS: DNR Patient's goal of care is comfort while preserving function.  Signed DNR and MOST forms present in home, copy on [facility] Epic EMR. Details of MOST form include comfort measures, determine use or limitation of antibiotics when infection occurs, IV fluids for a defined trial period, no feeding tube. Patient reiterated desire to not be resuscitated in the event of cardiac or respiratory arrest. Palliative care will continue to provide support to patient, family and medical team.  Symptom Management/Plan: Generalized weakness: Continue current plan of care. Pending eval for home health with PT. Maintain safety, prevent falls by encouraging consistent use of assistive device during ambulation. Family planning on getting personal caregiver assistance three times a  week to assist with patient's ADLs. Husband verbalized their goal is to remain at home through end of life. Abnormal weight loss: BMI 15.9 down from 18.7 three months ago. Discussed need to maintain routine of three meals and 2 snacks a day. Continue consumption of nutritional supplement such as Ensure to augment caloric intake. May consider appetite stimulant if indicated. COPD: condition is stable. No report of recent exacerbation. Continue current plan of care and follow up with pulmonology as scheduled. Provided general support and encouragement. Questions and concerns were addressed. Patient and husband was encouraged to call with questions and/or concerns.  Follow up Palliative Care Visit: Palliative care will continue to follow for complex medical decision making, advance care planning, and clarification of goals. Return in about 8 weeks or prn.  PPS: 50%  HOSPICE ELIGIBILITY/DIAGNOSIS: TBD  CHIEF COMPLAIN: Generalized weakness  History obtained from review of Epic EMR and discussion with Ms. Pla and her husband.  HISTORY OF PRESENT ILLNESS:  Katherine [name] is a 87 y.o. year old female with complaints of generalized weakness. Generalized weakness in the context of deconditioning related to chronic diseases. Patient continues to ambulate with a Rolator walker, no report of falls in the last month. She has an order pending for home health and physical therapy by her pcp. She report poor oral intake related to poor eating habits, report only eating one meal a day and eat snacks the rest of the day. Her husband report they ate working on improving  their meal habit as the husband also eats the same way. She report drink a can of protein drink a day. She denied nausea, denied fever, denied fever, denied chills, denied any uncontrolled pain. Ten systems reviewed and are negative for acute change, except as noted in the HPI.   Her other medical problems including COPD, right lung nodule (stable  on serial CT scans, between 02/07/2019 and 08/10/2020), chronic back pain related to L1 compression fracture, aortic stenosis with mild regurgitation, anxiety/depression.  CBC    Component Value Date/Time   WBC 5.9 11/27/2020 0931   RBC 3.81 11/27/2020 0931   HGB 10.6 (L) 11/27/2020 0931   HCT 34.5 (L) 11/27/2020 0931   PLT 223 11/27/2020 0931   MCV 90.6 11/27/2020 0931   MCH 27.8 11/27/2020 0931   MCHC 30.7 (L) 11/27/2020 0931   RDW 13.5 11/27/2020 0931   LYMPHSABS 1,693 11/27/2020 0931   MONOABS 0.8 08/03/2020 0934   EOSABS 301 11/27/2020 0931   BASOSABS 71 11/27/2020 0931    CMP     Component Value Date/Time   NA 143 11/27/2020 0931   K 3.7 11/27/2020 0931   CL 108 11/27/2020 0931   CO2 27 11/27/2020 0931   GLUCOSE 97 11/27/2020 0931   BUN 18 11/27/2020 0931   CREATININE 0.99 (H) 11/27/2020 0931   CALCIUM 9.2 11/27/2020 0931   PROT 5.9 (L) 11/27/2020 0931   ALBUMIN 3.4 (L) 08/26/2019 1108   AST 24 11/27/2020 0931   ALT 10 11/27/2020 0931   ALKPHOS 65 08/26/2019 1108   BILITOT 0.3 11/27/2020 0931   GFRNONAA 50 (L) 11/27/2020 0931   GFRAA 58 (L) 11/27/2020 0931     I reviewed available labs, medications, imaging, studies and related documents from the EMR.  Records reviewed and summarized above.   Vitals:   12/25/20 1130  BP: (!) 116/54  Pulse: 80  Resp: 20  SpO2: 97%    Physical Exam: Current and past weights: 84lb down from 99lbs 3 months ago, Ht 8f1", BMI 15.9 General: frail appearing, cachetic, sitting in recliner in NAD CV: no LE edema Pulmonary: no increased work of breathing, no acute cough, no wheezes, room air MSK:  sarcopenia, decreased ROM in all extremities, ambulatory Skin: warm and dry, no rashes or wound noted to exposed skin Neuro: weakness, otherwise non focal Psych: non-anxious affect today, A and O x 3 Hem/lymph/immuno: no widespread bruising  Past Medical History:  Diagnosis Date   Alcoholic ([place]    36 years sober   Allergy     Anemia    Arthritis    Asthma    patient denies   B12 deficiency    [place]    Depression    not recent   Diverticulosis    Esophageal stricture    Headache    cluster headaches occasionally   Hepatic cyst 01/30/10   Hiatal hernia 1985   patient unaware   Hx of adenomatous colonic polyps 2006   Hyperlipidemia    IBS (irritable bowel syndrome)    PONV (postoperative nausea and vomiting)    no nausea or vomitting after surgery 07/2014   Positive PPD    PUD (peptic ulcer disease)    Shortness of breath dyspnea    SOB WITH EXERTION   (NOT NEW)    Thank you for the opportunity to participate in the care of Ms. Deupree.  The palliative care team will continue to follow. Please call our office at  [phone] if we can be of additional assistance.   Jari Favre, DNP, AGPCNP-BC  COVID-19 PATIENT SCREENING TOOL Asked and negative response unless otherwise noted:   Have you had symptoms of covid, tested positive or been in contact with someone with symptoms/positive test in the past 5-10 days?

## 2021-01-01 ENCOUNTER — Other Ambulatory Visit: Payer: Medicare PPO | Admitting: Internal Medicine

## 2021-01-01 ENCOUNTER — Other Ambulatory Visit: Payer: Self-pay

## 2021-01-01 DIAGNOSIS — R531 Weakness: Secondary | ICD-10-CM | POA: Diagnosis not present

## 2021-01-01 DIAGNOSIS — R630 Anorexia: Secondary | ICD-10-CM

## 2021-01-01 DIAGNOSIS — R627 Adult failure to thrive: Secondary | ICD-10-CM

## 2021-01-01 DIAGNOSIS — D509 Iron deficiency anemia, unspecified: Secondary | ICD-10-CM | POA: Diagnosis not present

## 2021-01-01 LAB — CBC WITH DIFFERENTIAL/PLATELET
Absolute Monocytes: 515 cells/uL (ref 200–950)
Basophils Absolute: 99 cells/uL (ref 0–200)
Basophils Relative: 1.6 %
Eosinophils Absolute: 291 cells/uL (ref 15–500)
Eosinophils Relative: 4.7 %
HCT: 29.7 % — ABNORMAL LOW (ref 35.0–45.0)
Hemoglobin: 9.4 g/dL — ABNORMAL LOW (ref 11.7–15.5)
Lymphs Abs: 1525 cells/uL (ref 850–3900)
MCH: 29 pg (ref 27.0–33.0)
MCHC: 31.6 g/dL — ABNORMAL LOW (ref 32.0–36.0)
MCV: 91.7 fL (ref 80.0–100.0)
MPV: 10.9 fL (ref 7.5–12.5)
Monocytes Relative: 8.3 %
Neutro Abs: 3770 cells/uL (ref 1500–7800)
Neutrophils Relative %: 60.8 %
Platelets: 281 10*3/uL (ref 140–400)
RBC: 3.24 10*6/uL — ABNORMAL LOW (ref 3.80–5.10)
RDW: 12.7 % (ref 11.0–15.0)
Total Lymphocyte: 24.6 %
WBC: 6.2 10*3/uL (ref 3.8–10.8)

## 2021-01-01 LAB — IRON,TIBC AND FERRITIN PANEL
%SAT: 23 % (calc) (ref 16–45)
Ferritin: 11 ng/mL — ABNORMAL LOW (ref 16–288)
Iron: 85 ug/dL (ref 45–160)
TIBC: 374 mcg/dL (calc) (ref 250–450)

## 2021-01-03 ENCOUNTER — Ambulatory Visit: Payer: Medicare PPO | Admitting: Internal Medicine

## 2021-01-03 ENCOUNTER — Other Ambulatory Visit: Payer: Self-pay

## 2021-01-03 VITALS — BP 100/60 | HR 110 | Ht 60.0 in | Wt 90.0 lb

## 2021-01-03 DIAGNOSIS — D509 Iron deficiency anemia, unspecified: Secondary | ICD-10-CM | POA: Diagnosis not present

## 2021-01-03 DIAGNOSIS — R54 Age-related physical debility: Secondary | ICD-10-CM | POA: Diagnosis not present

## 2021-01-03 DIAGNOSIS — J449 Chronic obstructive pulmonary disease, unspecified: Secondary | ICD-10-CM

## 2021-01-03 NOTE — Progress Notes (Signed)
Subjective:    Patient ID: Katherine [name], female    DOB: [birth year], 87 y.o.   MRN: [id number]  HPI For follow up on iron deficiency.  At last visit in mid June total iron was 24, percent saturation was 7 and ferritin was 12.  Now her iron is 85, total iron binding capacity 374 up from 322, percent saturation now 23% and ferritin of 11.  Her hemoglobin was 10.6 g in June and is now 9.4 g.  Review of old records indicate patient had vagotomy and pyloroplasty in 1975 for peptic ulcer disease.  Reportedly has not had gastrectomy.  She has a history of COPD followed by Pulmonary.  This appears to be stable at the present time.  Pulse oximetry on room air is 96%  Tells me that Dr. Burney Gauze is going to do surgery on 2 5 is right-hand next week on right ring and long finger due to ruptures of sagittal bands.  Has not responded to wearing a brace.  Patient is agreeable to surgery as this has become aggravating for her.  Likely to have little blood loss with this surgery.  Fortunately she has gained weight.  In June 2021, she weighed 88 pounds.  However, by June 2022 she had dropped to 84 pounds.  Both daughters have been visiting this summer at separate times and she now weighs 90 pounds.  They have been cooking for her.  Usually her husband cooks.  Physical therapy has been ordered for her as well as her husband who has had a recent pelvic fracture and is recovering well.  She had colonoscopy in 2014.  Had tortuous sigmoid colon.  She is an alcoholic in recovery for many years.  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  She has remote history of peptic ulcer disease.  In 2014 she was found to be anemic and iron deficient.  Upper GI endoscopy showed stricture with no bleeding.  She had a strongly positive Hemoccult test at that time.  Anemia improved with iron therapy.  We tried Megace for appetite stimulant but this caused nausea.  She still sometimes gets nauseated  and takes Phenergan.  History of depression for which she takes Clozaril and Lexapro.  Has albuterol inhaler for COPD.  Sometimes takes Xanax for anxiety.  Also has a nebulizer and has been prescribed DuoNeb every 6 hours if needed.   Review of Systems see above     Objective:   Physical Exam Blood pressure 100/60 pulse 110 pulse oximetry 96% weight 90 pounds height 5 feet 0 inches BMI 17.58  Neck is supple.  No thyromegaly.  No carotid bruits.  Chest clear to auscultation.  Cardiac exam: Regular rate and rhythm 2/6 systolic ejection murmur heard in the upper right sternal border.  No lower extremity pitting edema.  Affect thought and judgment appear to be normal although she is slightly forgetful.     Assessment & Plan:  Persistent iron deficiency anemia  Normocytic anemia secondary to iron deficiency-referral to Hematology to see if she is a candidate for IV iron treatment.  I have not done Hemoccult card.  She has no history of black tarry stool.  No bright red blood per rectum.  History of COPD-stable on current regimen  History of right middle and ring finger sagittal band rupture to have surgery by Dr. Burney Gauze next week  May have mild dementia  Frailty  Plan: Continue careful monitoring and follow-up in 3  months.  Referral to hematology regarding persistent iron deficiency anemia.  1 year ago her hemoglobin was 13 g and has slightly decreased over the past several months.  She does have Palliative care follow-up with home visits

## 2021-01-04 ENCOUNTER — Encounter: Payer: Self-pay | Admitting: Internal Medicine

## 2021-01-04 NOTE — Patient Instructions (Addendum)
Recommend Hematology consultation for iron deficiency anemia.  Oral iron is not working all that well for her.  Follow-up here in 3 months.  She is to have surgery on 2 fingers right hand by Dr. Burney Gauze next week.  Hematology consultation can be in about 4 weeks as she is having pain in surgery next week.

## 2021-01-06 ENCOUNTER — Other Ambulatory Visit: Payer: Self-pay | Admitting: Emergency Medicine

## 2021-01-07 ENCOUNTER — Telehealth: Payer: Self-pay | Admitting: Physician Assistant

## 2021-01-07 NOTE — Telephone Encounter (Signed)
Scheduled appt per referral. Pt aware.  

## 2021-01-08 ENCOUNTER — Other Ambulatory Visit: Payer: Self-pay | Admitting: Internal Medicine

## 2021-01-11 DIAGNOSIS — S66392A Other injury of extensor muscle, fascia and tendon of right middle finger at wrist and hand level, initial encounter: Secondary | ICD-10-CM | POA: Diagnosis not present

## 2021-01-11 DIAGNOSIS — S66394A Other injury of extensor muscle, fascia and tendon of right ring finger at wrist and hand level, initial encounter: Secondary | ICD-10-CM | POA: Diagnosis not present

## 2021-01-11 DIAGNOSIS — S66312A Strain of extensor muscle, fascia and tendon of right middle finger at wrist and hand level, initial encounter: Secondary | ICD-10-CM | POA: Diagnosis not present

## 2021-01-11 DIAGNOSIS — S66314A Strain of extensor muscle, fascia and tendon of right ring finger at wrist and hand level, initial encounter: Secondary | ICD-10-CM | POA: Diagnosis not present

## 2021-01-14 DIAGNOSIS — M47816 Spondylosis without myelopathy or radiculopathy, lumbar region: Secondary | ICD-10-CM | POA: Diagnosis not present

## 2021-01-14 DIAGNOSIS — M4316 Spondylolisthesis, lumbar region: Secondary | ICD-10-CM | POA: Diagnosis not present

## 2021-01-14 DIAGNOSIS — M961 Postlaminectomy syndrome, not elsewhere classified: Secondary | ICD-10-CM | POA: Diagnosis not present

## 2021-01-14 DIAGNOSIS — M47818 Spondylosis without myelopathy or radiculopathy, sacral and sacrococcygeal region: Secondary | ICD-10-CM | POA: Diagnosis not present

## 2021-01-15 ENCOUNTER — Other Ambulatory Visit: Payer: Self-pay | Admitting: Internal Medicine

## 2021-01-16 DIAGNOSIS — F32A Depression, unspecified: Secondary | ICD-10-CM | POA: Diagnosis not present

## 2021-01-16 DIAGNOSIS — I700 Atherosclerosis of aorta: Secondary | ICD-10-CM | POA: Diagnosis not present

## 2021-01-16 DIAGNOSIS — I352 Nonrheumatic aortic (valve) stenosis with insufficiency: Secondary | ICD-10-CM | POA: Diagnosis not present

## 2021-01-16 DIAGNOSIS — M545 Low back pain, unspecified: Secondary | ICD-10-CM | POA: Diagnosis not present

## 2021-01-16 DIAGNOSIS — D509 Iron deficiency anemia, unspecified: Secondary | ICD-10-CM | POA: Diagnosis not present

## 2021-01-16 DIAGNOSIS — J449 Chronic obstructive pulmonary disease, unspecified: Secondary | ICD-10-CM | POA: Diagnosis not present

## 2021-01-16 DIAGNOSIS — F419 Anxiety disorder, unspecified: Secondary | ICD-10-CM | POA: Diagnosis not present

## 2021-01-16 DIAGNOSIS — R627 Adult failure to thrive: Secondary | ICD-10-CM | POA: Diagnosis not present

## 2021-01-16 DIAGNOSIS — G8929 Other chronic pain: Secondary | ICD-10-CM | POA: Diagnosis not present

## 2021-01-18 ENCOUNTER — Telehealth: Payer: Self-pay | Admitting: Internal Medicine

## 2021-01-18 DIAGNOSIS — R54 Age-related physical debility: Secondary | ICD-10-CM | POA: Diagnosis not present

## 2021-01-18 DIAGNOSIS — I350 Nonrheumatic aortic (valve) stenosis: Secondary | ICD-10-CM | POA: Diagnosis not present

## 2021-01-18 NOTE — Telephone Encounter (Signed)
Faxed signed orders to [facility] [phone], phone [phone]  Note date 01/17/2021  PT orders

## 2021-01-22 ENCOUNTER — Inpatient Hospital Stay: Payer: Medicare PPO

## 2021-01-22 ENCOUNTER — Other Ambulatory Visit: Payer: Self-pay

## 2021-01-22 ENCOUNTER — Encounter: Payer: Self-pay | Admitting: Physician Assistant

## 2021-01-22 ENCOUNTER — Inpatient Hospital Stay: Payer: Medicare PPO | Attending: Physician Assistant | Admitting: Physician Assistant

## 2021-01-22 VITALS — BP 103/58 | HR 112 | Temp 97.8°F | Resp 16 | Ht 60.0 in | Wt 95.4 lb

## 2021-01-22 DIAGNOSIS — D509 Iron deficiency anemia, unspecified: Secondary | ICD-10-CM | POA: Insufficient documentation

## 2021-01-22 DIAGNOSIS — M129 Arthropathy, unspecified: Secondary | ICD-10-CM | POA: Insufficient documentation

## 2021-01-22 DIAGNOSIS — J449 Chronic obstructive pulmonary disease, unspecified: Secondary | ICD-10-CM | POA: Insufficient documentation

## 2021-01-22 DIAGNOSIS — F1011 Alcohol abuse, in remission: Secondary | ICD-10-CM | POA: Diagnosis not present

## 2021-01-22 DIAGNOSIS — Z8711 Personal history of peptic ulcer disease: Secondary | ICD-10-CM | POA: Diagnosis not present

## 2021-01-22 DIAGNOSIS — K449 Diaphragmatic hernia without obstruction or gangrene: Secondary | ICD-10-CM | POA: Diagnosis not present

## 2021-01-22 DIAGNOSIS — K589 Irritable bowel syndrome without diarrhea: Secondary | ICD-10-CM | POA: Insufficient documentation

## 2021-01-22 DIAGNOSIS — E785 Hyperlipidemia, unspecified: Secondary | ICD-10-CM | POA: Insufficient documentation

## 2021-01-22 DIAGNOSIS — Z87891 Personal history of nicotine dependence: Secondary | ICD-10-CM | POA: Insufficient documentation

## 2021-01-22 DIAGNOSIS — D508 Other iron deficiency anemias: Secondary | ICD-10-CM | POA: Diagnosis not present

## 2021-01-22 DIAGNOSIS — Z79899 Other long term (current) drug therapy: Secondary | ICD-10-CM | POA: Diagnosis not present

## 2021-01-22 DIAGNOSIS — E538 Deficiency of other specified B group vitamins: Secondary | ICD-10-CM | POA: Diagnosis not present

## 2021-01-22 DIAGNOSIS — R5383 Other fatigue: Secondary | ICD-10-CM | POA: Diagnosis not present

## 2021-01-22 LAB — CBC WITH DIFFERENTIAL (CANCER CENTER ONLY)
Abs Immature Granulocytes: 0.02 10*3/uL (ref 0.00–0.07)
Basophils Absolute: 0 10*3/uL (ref 0.0–0.1)
Basophils Relative: 1 %
Eosinophils Absolute: 0.2 10*3/uL (ref 0.0–0.5)
Eosinophils Relative: 3 %
HCT: 28.7 % — ABNORMAL LOW (ref 36.0–46.0)
Hemoglobin: 9.1 g/dL — ABNORMAL LOW (ref 12.0–15.0)
Immature Granulocytes: 0 %
Lymphocytes Relative: 20 %
Lymphs Abs: 1.2 10*3/uL (ref 0.7–4.0)
MCH: 28.2 pg (ref 26.0–34.0)
MCHC: 31.7 g/dL (ref 30.0–36.0)
MCV: 88.9 fL (ref 80.0–100.0)
Monocytes Absolute: 0.6 10*3/uL (ref 0.1–1.0)
Monocytes Relative: 10 %
Neutro Abs: 4 10*3/uL (ref 1.7–7.7)
Neutrophils Relative %: 66 %
Platelet Count: 233 10*3/uL (ref 150–400)
RBC: 3.23 MIL/uL — ABNORMAL LOW (ref 3.87–5.11)
RDW: 12.7 % (ref 11.5–15.5)
WBC Count: 6 10*3/uL (ref 4.0–10.5)
nRBC: 0 % (ref 0.0–0.2)

## 2021-01-22 LAB — CMP (CANCER CENTER ONLY)
ALT: 10 U/L (ref 0–44)
AST: 24 U/L (ref 15–41)
Albumin: 3.4 g/dL — ABNORMAL LOW (ref 3.5–5.0)
Alkaline Phosphatase: 65 U/L (ref 38–126)
Anion gap: 8 (ref 5–15)
BUN: 24 mg/dL — ABNORMAL HIGH (ref 8–23)
CO2: 25 mmol/L (ref 22–32)
Calcium: 9.7 mg/dL (ref 8.9–10.3)
Chloride: 108 mmol/L (ref 98–111)
Creatinine: 1.18 mg/dL — ABNORMAL HIGH (ref 0.44–1.00)
GFR, Estimated: 44 mL/min — ABNORMAL LOW (ref >60)
Glucose, Bld: 107 mg/dL — ABNORMAL HIGH (ref 70–99)
Potassium: 4.3 mmol/L (ref 3.5–5.1)
Sodium: 141 mmol/L (ref 135–145)
Total Bilirubin: <0.2 mg/dL — ABNORMAL LOW (ref 0.3–1.2)
Total Protein: 6.4 g/dL — ABNORMAL LOW (ref 6.5–8.1)

## 2021-01-22 LAB — FERRITIN: Ferritin: 8 ng/mL — ABNORMAL LOW (ref 11–307)

## 2021-01-22 LAB — RETIC PANEL
Immature Retic Fract: 10.5 % (ref 2.3–15.9)
RBC.: 3.2 MIL/uL — ABNORMAL LOW (ref 3.87–5.11)
Retic Count, Absolute: 71 10*3/uL (ref 19.0–186.0)
Retic Ct Pct: 2.2 % (ref 0.4–3.1)
Reticulocyte Hemoglobin: 22.6 pg — ABNORMAL LOW (ref >27.9)

## 2021-01-22 LAB — IRON AND TIBC
Iron: 136 ug/dL (ref 41–142)
Saturation Ratios: 32 % (ref 21–57)
TIBC: 419 ug/dL (ref 236–444)
UIBC: 283 ug/dL (ref 120–384)

## 2021-01-22 MED ORDER — FERROUS SULFATE 325 (65 FE) MG PO TBEC: DELAYED_RELEASE_TABLET | ORAL | 3 refills | Status: DC

## 2021-01-22 NOTE — Progress Notes (Signed)
[place] [phone]   [phone]  INITIAL CONSULT NOTE  Patient Care Team: Elby Showers, MD as PCP - General (Internal Medicine)  Hematological/Oncological History 1) Labs from PCP, Dr. Gerome Apley -08/03/2020: WBC 11.0 (H), Hgb 10.8 (L), MCV 92.5, Plt 193 -09/04/2020: WBC 5.6, Hgb 10.5 (L), MCV 88.4, Plt 235, Iron 96, TIBC 306, Iron Saturation 31%, Ferritin 17 (L) -11/27/2020: WBC 5.9, Hgb 10.6 (L), MCV 90.6,Plt 223, Iron 24 (L), TIBC 322, Iron Saturation 7% (L), Ferritin 12 (L), Vitamin B12 663, Folate 18.2 -01/01/2021: WBC 6.2, Hgb 9.4 (L), MCV 91.7, Plt 281, Iron 85, TIBC 374, Iron saturation 23%, Ferritin 11 (L)  2) 01/22/2021: Establish care with Dede Query PA-C  CHIEF COMPLAINTS/PURPOSE OF CONSULTATION:  "Iron deficiency anemia "  HISTORY OF PRESENTING ILLNESS:  Katherine [name] 87 y.o. female with medical history significant for COPD, depression, esophageal stricture, peptic ulcer disease, hyperlipidemia, hiatal hernia, vitamin B12 deficiency, arthritis, and IBS. Patient is accompanied by her husband for this visit.   On exam today, Katherine [name] reports chronic fatigue over the last several months. She can complete her basic ADLs on her own such as self care. She uses a walker to assist with ambulation. Patient reports decreased appetite with recent weight loss. She has gained some of the weight back. Patient denies any dietary restrictions.  She eats meat 1-2 times per week. Patient denies nausea, vomiting or abdominal pain. She has constipation that she feels is secondary to the pain medication she takes daily. Patient recent underwent surgery of her right hand due to ruptures of sagittal bands. She has stable shortness of breath mainly with exertion secondary to COPD.  She denies any fevers, chills, night sweats, chest pain or cough. She has no other complaints. Rest of the 10 point ROS is below.   MEDICAL HISTORY:  Past Medical History:   Diagnosis Date   Alcoholic ([place])    36 years sober   Allergy    Anemia    Arthritis    Asthma    patient denies   B12 deficiency    COPD (chronic obstructive pulmonary disease) ([place])    Depression    not recent   Diverticulosis    Esophageal stricture    Headache    cluster headaches occasionally   Hepatic cyst 01/30/10   Hiatal hernia 1985   patient unaware   Hx of adenomatous colonic polyps 2006   Hyperlipidemia    IBS (irritable bowel syndrome)    PONV (postoperative nausea and vomiting)    no nausea or vomitting after surgery 07/2014   Positive PPD    PUD (peptic ulcer disease)    Shortness of breath dyspnea    SOB WITH EXERTION   (NOT NEW)    SURGICAL HISTORY: Past Surgical History:  Procedure Laterality Date   ABDOMINAL HYSTERECTOMY     abnormal bleeding   APPENDECTOMY     BARTHOLIN GLAND CYST EXCISION     x2   BREAST BIOPSY Bilateral    Milk glnd   CATARACT EXTRACTION W/ INTRAOCULAR LENS  IMPLANT, BILATERAL Bilateral    COLONOSCOPY     COLONOSCOPY W/ POLYPECTOMY     HARDWARE REMOVAL N/A 12/13/2015   Procedure: Removal of HARDWARE  Lumbar Five-Sacral One ;  Surgeon: Kary Kos, MD;  Location: [facility];  Service: Neurosurgery;  Laterality: N/A;   IR KYPHO LUMBAR INC FX REDUCE BONE BX UNI/BIL CANNULATION INC/IMAGING  07/13/2019   UPPER GI ENDOSCOPY  VAGOTOMY AND PYLOROPLASTY     in [place], [place]    SOCIAL HISTORY: Social History   Socioeconomic History   Marital status: Married    Spouse name: Not on file   Number of children: 2   Years of education: Not on file   Highest education level: Not on file  Occupational History    Employer: RETIRED  Tobacco Use   Smoking status: Former    Years: 60.00    Types: Cigarettes    Quit date: 02/02/2010    Years since quitting: 10.9   Smokeless tobacco: Never  Substance and Sexual Activity   Alcohol use: No    Comment: recovering alcoholic     35 YRS     Drug use: No   Sexual activity: Not on file   Other Topics Concern   Not on file  Social History Narrative   Daily caffeine    Social Determinants of Health   Financial Resource Strain: Not on file  Food Insecurity: Not on file  Transportation Needs: Not on file  Physical Activity: Not on file  Stress: Not on file  Social Connections: Not on file  Intimate Partner Violence: Not on file    FAMILY HISTORY: Family History  Problem Relation Age of Onset   Ulcers Father    Stroke Father    Irritable bowel syndrome Father    Ovarian cancer Sister    Colon cancer Neg Hx     ALLERGIES:  is allergic to aspirin.  MEDICATIONS:  Current Outpatient Medications  Medication Sig Dispense Refill   albuterol (VENTOLIN HFA) 108 (90 Base) MCG/ACT inhaler INHALE 2 PUFFS INTO THE LUNGS 4 (FOUR) TIMES DAILY AS NEEDED FOR WHEEZING OR SHORTNESS OF BREATH. 36 each 2   ALPRAZolam (XANAX) 0.25 MG tablet TAKE ONE TAB UP TO TWICE DAILY AS NEEDED FOR ANXIETY AND SLEEP 60 tablet 2   escitalopram (LEXAPRO) 5 MG tablet TAKE 1 TABLET BY MOUTH EVERY DAY 90 tablet 2   ferrous sulfate 325 (65 FE) MG EC tablet Take 1 tablet by mouth once every other day with a glass of orange juice. 30 tablet 3   ipratropium-albuterol (DUONEB) 0.5-2.5 (3) MG/3ML SOLN Take 3 mLs by nebulization every 6 (six) hours. 360 mL 5   traMADol (ULTRAM) 50 MG tablet Take 50 mg by mouth every 6 (six) hours as needed for moderate pain.      traZODone (DESYREL) 50 MG tablet TAKE 3 TABLETS BY MOUTH AT BEDTIME (Patient taking differently: Take 50 mg by mouth at bedtime.) 270 tablet 0   promethazine (PHENERGAN) 12.5 MG tablet TAKE 1 TABLET (12.5 MG TOTAL) BY MOUTH EVERY 8 (EIGHT) HOURS AS NEEDED FOR NAUSEA OR VOMITING. (Patient not taking: Reported on 01/22/2021) 30 tablet 3   No current facility-administered medications for this visit.    REVIEW OF SYSTEMS:   Constitutional: ( - ) fevers, ( - )  chills , ( - ) night sweats Eyes: ( - ) blurriness of vision, ( - ) double vision, ( - )  watery eyes Ears, nose, mouth, throat, and face: ( - ) mucositis, ( - ) sore throat Respiratory: ( - ) cough, ( +) dyspnea, ( - ) wheezes Cardiovascular: ( - ) palpitation, ( - ) chest discomfort, ( - ) lower extremity swelling Gastrointestinal:  ( - ) nausea, ( - ) heartburn, ( - ) change in bowel habits Skin: ( - ) abnormal skin rashes Lymphatics: ( - ) new lymphadenopathy, ( - ) easy  bruising Neurological: ( - ) numbness, ( - ) tingling, ( - ) new weaknesses Behavioral/Psych: ( - ) mood change, ( - ) new changes  All other systems were reviewed with the patient and are negative.  PHYSICAL EXAMINATION: ECOG PERFORMANCE STATUS: 1 - Symptomatic but completely ambulatory  Vitals:   01/22/21 1354 01/22/21 1427  BP: (!) 89/52 (!) 103/58  Pulse: (!) 112   Resp: 16   Temp: 97.8 F (36.6 C)   SpO2: 97%    Filed Weights   01/22/21 1354  Weight: 95 lb 6.4 oz (43.3 kg)    GENERAL: well appearing female in NAD  SKIN: skin color, texture, turgor are normal, no rashes or significant lesions EYES: conjunctiva are pink and non-injected, sclera clear LUNGS: clear to auscultation and percussion with normal breathing effort HEART: regular rate & rhythm and no murmurs and no lower extremity edema ABDOMEN: soft, non-tender, non-distended, normal bowel sounds Musculoskeletal: no cyanosis of digits and no clubbing  PSYCH: alert & oriented x 3, fluent speech NEURO: no focal motor/sensory deficits  LABORATORY DATA:  I have reviewed the data as listed CBC Latest Ref Rng & Units 01/22/2021 01/01/2021 11/27/2020  WBC 4.0 - 10.5 K/uL 6.0 6.2 5.9  Hemoglobin 12.0 - 15.0 g/dL 9.1(L) 9.4(L) 10.6(L)  Hematocrit 36.0 - 46.0 % 28.7(L) 29.7(L) 34.5(L)  Platelets 150 - 400 K/uL 233 281 223    CMP Latest Ref Rng & Units 01/22/2021 11/27/2020 09/04/2020  Glucose 70 - 99 mg/dL 107(H) 97 78  BUN 8 - 23 mg/dL 24(H) 18 14  Creatinine 0.44 - 1.00 mg/dL 1.18(H) 0.99(H) 1.05(H)  Sodium 135 - 145 mmol/L 141 143 142   Potassium 3.5 - 5.1 mmol/L 4.3 3.7 4.6  Chloride 98 - 111 mmol/L 108 108 106  CO2 22 - 32 mmol/L '25 27 28  '$ Calcium 8.9 - 10.3 mg/dL 9.7 9.2 8.9  Total Protein 6.5 - 8.1 g/dL 6.4(L) 5.9(L) 5.8(L)  Total Bilirubin 0.3 - 1.2 mg/dL <0.2(L) 0.3 0.3  Alkaline Phos 38 - 126 U/L 65 - -  AST 15 - 41 U/L '24 24 21  '$ ALT 0 - 44 U/L '10 10 9    '$ ASSESSMENT & PLAN Arionna Wical Hocevar is a 87 y.o. female who presents to the clinic for evaluation for iron deficiency anemia. Patient was taking oral iron supplementation up until 2-3 weeks ago after labs from 01/01/2021 revealed persistent anemia with hemoglobin of 9.4 and ferritin of 11.  I recommend for patient to proceed with labs today to check CBC, CMP, Iron and TIBC, ferritin and retic panel. If there is persistent anemia with hemoglobin less than 10, I recommend IV venofer x 5 doses.   #Iron deficiency anemia: --Etiology unknown. Recommend referral to GI to evaluate for GI bleed versus malabsorption.  --Patient has history of PUD and recent taking daily dose of advil for recent hand injury with surgery. Advised to limit or discontinue advil until we rule out GI bleed.  --Labs today to check CBC, CMP, TIBC and iron, ferritin and retic panel.  --Recommend ferrous sulfate 325 mg once every other day with a glass of orange juice.  --Recommend IV venofer x 5 doses if hemoglobin is less than 10 today with persistent iron deficiency --RTC 4-6 weeks after completion of IV venofer.   Orders Placed This Encounter  Procedures   CBC with Differential ([place] Only)    Standing Status:   Future    Number of Occurrences:   1  Standing Expiration Date:   01/22/2022   CMP ([place] only)    Standing Status:   Future    Number of Occurrences:   1    Standing Expiration Date:   01/22/2022   Ferritin    Standing Status:   Future    Number of Occurrences:   1    Standing Expiration Date:   01/22/2022   Iron and TIBC    Standing Status:   Future    Number  of Occurrences:   1    Standing Expiration Date:   01/22/2022   Retic Panel    Standing Status:   Future    Number of Occurrences:   1    Standing Expiration Date:   01/22/2022    All questions were answered. The patient knows to call the clinic with any problems, questions or concerns.  I have spent a total of 60 minutes minutes of face-to-face and non-face-to-face time, preparing to see the patient, obtaining and/or reviewing separately obtained history, performing a medically appropriate examination, counseling and educating the patient, ordering medications/tests, documenting clinical information in the electronic health record, and care coordination.   Dede Query, PA-C Department of Hematology/Oncology [place] at [facility] Phone: [phone]

## 2021-01-22 NOTE — Addendum Note (Signed)
Addended by: Dede Query T on: 01/22/2021 10:33 PM   Modules accepted: Orders

## 2021-01-23 ENCOUNTER — Telehealth: Payer: Self-pay

## 2021-01-23 ENCOUNTER — Telehealth: Payer: Self-pay | Admitting: Internal Medicine

## 2021-01-23 DIAGNOSIS — G8929 Other chronic pain: Secondary | ICD-10-CM | POA: Diagnosis not present

## 2021-01-23 DIAGNOSIS — I352 Nonrheumatic aortic (valve) stenosis with insufficiency: Secondary | ICD-10-CM | POA: Diagnosis not present

## 2021-01-23 DIAGNOSIS — I700 Atherosclerosis of aorta: Secondary | ICD-10-CM | POA: Diagnosis not present

## 2021-01-23 DIAGNOSIS — D509 Iron deficiency anemia, unspecified: Secondary | ICD-10-CM | POA: Diagnosis not present

## 2021-01-23 DIAGNOSIS — F419 Anxiety disorder, unspecified: Secondary | ICD-10-CM | POA: Diagnosis not present

## 2021-01-23 DIAGNOSIS — J449 Chronic obstructive pulmonary disease, unspecified: Secondary | ICD-10-CM | POA: Diagnosis not present

## 2021-01-23 DIAGNOSIS — R627 Adult failure to thrive: Secondary | ICD-10-CM | POA: Diagnosis not present

## 2021-01-23 DIAGNOSIS — F32A Depression, unspecified: Secondary | ICD-10-CM | POA: Diagnosis not present

## 2021-01-23 DIAGNOSIS — M545 Low back pain, unspecified: Secondary | ICD-10-CM | POA: Diagnosis not present

## 2021-01-23 NOTE — Telephone Encounter (Signed)
Per DPR, I tried to call the patient's home phone number that is listed as well as her spouse's number on file. I was unable to reach the patient or her spouse. I left a detailed message on Mr. [place] voicemail explaining the patient's lab results and asking him to convey them to the patient. I advised them that our team would be reaching out to them to get the IV iron scheduled and for them to call us back with any questions or concerns.

## 2021-01-23 NOTE — Telephone Encounter (Signed)
-----   Message from Lincoln Brigham, PA-C sent at 01/23/2021  8:40 AM EDT ----- Can you let the patient know that she has persistent iron deficiency anemia. We will proceed with IV venofer x 5 doses.

## 2021-01-23 NOTE — Telephone Encounter (Signed)
Faxed signed certification of orders to [facility] [phone], phone [phone]  Order # [id number]  Certification for [id number] to 03/16/2021

## 2021-01-28 DIAGNOSIS — F32A Depression, unspecified: Secondary | ICD-10-CM | POA: Diagnosis not present

## 2021-01-28 DIAGNOSIS — J449 Chronic obstructive pulmonary disease, unspecified: Secondary | ICD-10-CM | POA: Diagnosis not present

## 2021-01-28 DIAGNOSIS — I700 Atherosclerosis of aorta: Secondary | ICD-10-CM | POA: Diagnosis not present

## 2021-01-28 DIAGNOSIS — D509 Iron deficiency anemia, unspecified: Secondary | ICD-10-CM | POA: Diagnosis not present

## 2021-01-28 DIAGNOSIS — M545 Low back pain, unspecified: Secondary | ICD-10-CM | POA: Diagnosis not present

## 2021-01-28 DIAGNOSIS — G8929 Other chronic pain: Secondary | ICD-10-CM | POA: Diagnosis not present

## 2021-01-28 DIAGNOSIS — I352 Nonrheumatic aortic (valve) stenosis with insufficiency: Secondary | ICD-10-CM | POA: Diagnosis not present

## 2021-01-28 DIAGNOSIS — F419 Anxiety disorder, unspecified: Secondary | ICD-10-CM | POA: Diagnosis not present

## 2021-01-28 DIAGNOSIS — R627 Adult failure to thrive: Secondary | ICD-10-CM | POA: Diagnosis not present

## 2021-01-29 ENCOUNTER — Telehealth: Payer: Self-pay | Admitting: Internal Medicine

## 2021-01-29 ENCOUNTER — Inpatient Hospital Stay: Payer: Medicare PPO

## 2021-01-29 ENCOUNTER — Other Ambulatory Visit: Payer: Self-pay

## 2021-01-29 VITALS — BP 114/60 | HR 89 | Temp 98.0°F | Resp 18

## 2021-01-29 DIAGNOSIS — E785 Hyperlipidemia, unspecified: Secondary | ICD-10-CM | POA: Diagnosis not present

## 2021-01-29 DIAGNOSIS — F1011 Alcohol abuse, in remission: Secondary | ICD-10-CM | POA: Diagnosis not present

## 2021-01-29 DIAGNOSIS — R5383 Other fatigue: Secondary | ICD-10-CM | POA: Diagnosis not present

## 2021-01-29 DIAGNOSIS — E538 Deficiency of other specified B group vitamins: Secondary | ICD-10-CM | POA: Diagnosis not present

## 2021-01-29 DIAGNOSIS — D508 Other iron deficiency anemias: Secondary | ICD-10-CM

## 2021-01-29 DIAGNOSIS — D509 Iron deficiency anemia, unspecified: Secondary | ICD-10-CM | POA: Diagnosis not present

## 2021-01-29 DIAGNOSIS — K449 Diaphragmatic hernia without obstruction or gangrene: Secondary | ICD-10-CM | POA: Diagnosis not present

## 2021-01-29 DIAGNOSIS — J449 Chronic obstructive pulmonary disease, unspecified: Secondary | ICD-10-CM | POA: Diagnosis not present

## 2021-01-29 DIAGNOSIS — M129 Arthropathy, unspecified: Secondary | ICD-10-CM | POA: Diagnosis not present

## 2021-01-29 DIAGNOSIS — Z79899 Other long term (current) drug therapy: Secondary | ICD-10-CM | POA: Diagnosis not present

## 2021-01-29 MED ORDER — SODIUM CHLORIDE 0.9 % IV SOLN: Freq: Once | INTRAVENOUS | Status: AC

## 2021-01-29 MED ORDER — SODIUM CHLORIDE 0.9 % IV SOLN
200.0000 mg | Freq: Once | INTRAVENOUS | Status: AC
  Administered 2021-01-29: 200 mg via INTRAVENOUS
  Filled 2021-01-29: qty 200

## 2021-01-29 NOTE — Patient Instructions (Signed)

## 2021-01-29 NOTE — Telephone Encounter (Signed)
Faxed certified Signed orders to [facility] [phone], phone [phone]  Order # [id number]  Good thru 01/16/2021 to 03/16/2021  Occupational therapy 1wk 1, 1 every 2WK2

## 2021-02-06 ENCOUNTER — Other Ambulatory Visit: Payer: Self-pay

## 2021-02-06 ENCOUNTER — Telehealth: Payer: Self-pay | Admitting: Internal Medicine

## 2021-02-06 ENCOUNTER — Inpatient Hospital Stay: Payer: Medicare PPO

## 2021-02-06 VITALS — BP 127/58 | HR 82 | Temp 98.4°F | Resp 16

## 2021-02-06 DIAGNOSIS — J449 Chronic obstructive pulmonary disease, unspecified: Secondary | ICD-10-CM | POA: Diagnosis not present

## 2021-02-06 DIAGNOSIS — F1011 Alcohol abuse, in remission: Secondary | ICD-10-CM | POA: Diagnosis not present

## 2021-02-06 DIAGNOSIS — E785 Hyperlipidemia, unspecified: Secondary | ICD-10-CM | POA: Diagnosis not present

## 2021-02-06 DIAGNOSIS — E538 Deficiency of other specified B group vitamins: Secondary | ICD-10-CM | POA: Diagnosis not present

## 2021-02-06 DIAGNOSIS — M129 Arthropathy, unspecified: Secondary | ICD-10-CM | POA: Diagnosis not present

## 2021-02-06 DIAGNOSIS — D509 Iron deficiency anemia, unspecified: Secondary | ICD-10-CM | POA: Diagnosis not present

## 2021-02-06 DIAGNOSIS — R5383 Other fatigue: Secondary | ICD-10-CM | POA: Diagnosis not present

## 2021-02-06 DIAGNOSIS — D508 Other iron deficiency anemias: Secondary | ICD-10-CM

## 2021-02-06 DIAGNOSIS — Z79899 Other long term (current) drug therapy: Secondary | ICD-10-CM | POA: Diagnosis not present

## 2021-02-06 DIAGNOSIS — K449 Diaphragmatic hernia without obstruction or gangrene: Secondary | ICD-10-CM | POA: Diagnosis not present

## 2021-02-06 MED ORDER — SODIUM CHLORIDE 0.9 % IV SOLN: Freq: Once | INTRAVENOUS | Status: AC

## 2021-02-06 MED ORDER — SODIUM CHLORIDE 0.9 % IV SOLN
200.0000 mg | Freq: Once | INTRAVENOUS | Status: AC
  Administered 2021-02-06: 200 mg via INTRAVENOUS
  Filled 2021-02-06: qty 200

## 2021-02-06 NOTE — Patient Instructions (Signed)

## 2021-02-06 NOTE — Telephone Encounter (Signed)
Faxed signed certified orders to [facility] [phone], phone [phone]  Order # [id number]  PT 1 wk2, 2wk2, 1wk3 OT effective 01/27/2021 2wk1, Katherine [name]

## 2021-02-08 ENCOUNTER — Other Ambulatory Visit: Payer: Self-pay | Admitting: Internal Medicine

## 2021-02-08 DIAGNOSIS — R627 Adult failure to thrive: Secondary | ICD-10-CM | POA: Diagnosis not present

## 2021-02-08 DIAGNOSIS — M545 Low back pain, unspecified: Secondary | ICD-10-CM | POA: Diagnosis not present

## 2021-02-08 DIAGNOSIS — J449 Chronic obstructive pulmonary disease, unspecified: Secondary | ICD-10-CM | POA: Diagnosis not present

## 2021-02-08 DIAGNOSIS — I352 Nonrheumatic aortic (valve) stenosis with insufficiency: Secondary | ICD-10-CM | POA: Diagnosis not present

## 2021-02-08 DIAGNOSIS — F32A Depression, unspecified: Secondary | ICD-10-CM | POA: Diagnosis not present

## 2021-02-08 DIAGNOSIS — G8929 Other chronic pain: Secondary | ICD-10-CM | POA: Diagnosis not present

## 2021-02-08 DIAGNOSIS — D509 Iron deficiency anemia, unspecified: Secondary | ICD-10-CM | POA: Diagnosis not present

## 2021-02-08 DIAGNOSIS — F419 Anxiety disorder, unspecified: Secondary | ICD-10-CM | POA: Diagnosis not present

## 2021-02-08 DIAGNOSIS — I700 Atherosclerosis of aorta: Secondary | ICD-10-CM | POA: Diagnosis not present

## 2021-02-11 ENCOUNTER — Telehealth: Payer: Self-pay | Admitting: Physician Assistant

## 2021-02-11 NOTE — Telephone Encounter (Signed)
R/s iron infusion on 9/7 per RN Melanie. Called pt, no answer. Left msg with updated appt date and time.

## 2021-02-12 ENCOUNTER — Ambulatory Visit: Payer: Medicare PPO

## 2021-02-14 ENCOUNTER — Telehealth: Payer: Self-pay | Admitting: Emergency Medicine

## 2021-02-14 MED ORDER — ALBUTEROL SULFATE HFA 108 (90 BASE) MCG/ACT IN AERS: INHALATION_SPRAY | RESPIRATORY_TRACT | 2 refills | Status: DC

## 2021-02-14 NOTE — Telephone Encounter (Signed)
Refill has been sent to the pharmacy per pts request.  Nothing further is needed.  

## 2021-02-15 DIAGNOSIS — M545 Low back pain, unspecified: Secondary | ICD-10-CM | POA: Diagnosis not present

## 2021-02-15 DIAGNOSIS — D509 Iron deficiency anemia, unspecified: Secondary | ICD-10-CM | POA: Diagnosis not present

## 2021-02-15 DIAGNOSIS — F32A Depression, unspecified: Secondary | ICD-10-CM | POA: Diagnosis not present

## 2021-02-15 DIAGNOSIS — F419 Anxiety disorder, unspecified: Secondary | ICD-10-CM | POA: Diagnosis not present

## 2021-02-15 DIAGNOSIS — G8929 Other chronic pain: Secondary | ICD-10-CM | POA: Diagnosis not present

## 2021-02-15 DIAGNOSIS — J449 Chronic obstructive pulmonary disease, unspecified: Secondary | ICD-10-CM | POA: Diagnosis not present

## 2021-02-15 DIAGNOSIS — I352 Nonrheumatic aortic (valve) stenosis with insufficiency: Secondary | ICD-10-CM | POA: Diagnosis not present

## 2021-02-15 DIAGNOSIS — I700 Atherosclerosis of aorta: Secondary | ICD-10-CM | POA: Diagnosis not present

## 2021-02-15 DIAGNOSIS — R627 Adult failure to thrive: Secondary | ICD-10-CM | POA: Diagnosis not present

## 2021-02-16 ENCOUNTER — Other Ambulatory Visit: Payer: Self-pay

## 2021-02-16 ENCOUNTER — Inpatient Hospital Stay: Payer: Medicare PPO | Attending: Physician Assistant

## 2021-02-16 VITALS — BP 126/65 | HR 74 | Temp 97.9°F | Resp 18

## 2021-02-16 DIAGNOSIS — D509 Iron deficiency anemia, unspecified: Secondary | ICD-10-CM | POA: Diagnosis not present

## 2021-02-16 DIAGNOSIS — Z79899 Other long term (current) drug therapy: Secondary | ICD-10-CM | POA: Insufficient documentation

## 2021-02-16 DIAGNOSIS — D508 Other iron deficiency anemias: Secondary | ICD-10-CM

## 2021-02-16 MED ORDER — SODIUM CHLORIDE 0.9 % IV SOLN: Freq: Once | INTRAVENOUS | Status: AC

## 2021-02-16 MED ORDER — SODIUM CHLORIDE 0.9 % IV SOLN
200.0000 mg | Freq: Once | INTRAVENOUS | Status: AC
  Administered 2021-02-16: 200 mg via INTRAVENOUS
  Filled 2021-02-16: qty 200

## 2021-02-16 NOTE — Patient Instructions (Signed)

## 2021-02-20 ENCOUNTER — Ambulatory Visit: Payer: Medicare PPO

## 2021-02-20 DIAGNOSIS — F32A Depression, unspecified: Secondary | ICD-10-CM | POA: Diagnosis not present

## 2021-02-20 DIAGNOSIS — I352 Nonrheumatic aortic (valve) stenosis with insufficiency: Secondary | ICD-10-CM | POA: Diagnosis not present

## 2021-02-20 DIAGNOSIS — I700 Atherosclerosis of aorta: Secondary | ICD-10-CM | POA: Diagnosis not present

## 2021-02-20 DIAGNOSIS — F419 Anxiety disorder, unspecified: Secondary | ICD-10-CM | POA: Diagnosis not present

## 2021-02-20 DIAGNOSIS — R627 Adult failure to thrive: Secondary | ICD-10-CM | POA: Diagnosis not present

## 2021-02-20 DIAGNOSIS — G8929 Other chronic pain: Secondary | ICD-10-CM | POA: Diagnosis not present

## 2021-02-20 DIAGNOSIS — D509 Iron deficiency anemia, unspecified: Secondary | ICD-10-CM | POA: Diagnosis not present

## 2021-02-20 DIAGNOSIS — M545 Low back pain, unspecified: Secondary | ICD-10-CM | POA: Diagnosis not present

## 2021-02-20 DIAGNOSIS — J449 Chronic obstructive pulmonary disease, unspecified: Secondary | ICD-10-CM | POA: Diagnosis not present

## 2021-02-23 ENCOUNTER — Other Ambulatory Visit: Payer: Self-pay

## 2021-02-23 ENCOUNTER — Inpatient Hospital Stay: Payer: Medicare PPO

## 2021-02-23 VITALS — BP 126/55 | HR 76 | Temp 97.8°F | Resp 18

## 2021-02-23 DIAGNOSIS — D509 Iron deficiency anemia, unspecified: Secondary | ICD-10-CM | POA: Diagnosis not present

## 2021-02-23 DIAGNOSIS — Z79899 Other long term (current) drug therapy: Secondary | ICD-10-CM | POA: Diagnosis not present

## 2021-02-23 DIAGNOSIS — D508 Other iron deficiency anemias: Secondary | ICD-10-CM

## 2021-02-23 MED ORDER — SODIUM CHLORIDE 0.9 % IV SOLN
200.0000 mg | Freq: Once | INTRAVENOUS | Status: AC
  Administered 2021-02-23: 200 mg via INTRAVENOUS
  Filled 2021-02-23: qty 200

## 2021-02-23 MED ORDER — SODIUM CHLORIDE 0.9 % IV SOLN: Freq: Once | INTRAVENOUS | Status: AC

## 2021-02-23 NOTE — Patient Instructions (Signed)

## 2021-02-25 ENCOUNTER — Telehealth: Payer: Self-pay

## 2021-02-25 NOTE — Telephone Encounter (Signed)
Verbal for home health extension given.

## 2021-02-26 ENCOUNTER — Telehealth: Payer: Self-pay | Admitting: Internal Medicine

## 2021-02-26 ENCOUNTER — Other Ambulatory Visit: Payer: Self-pay

## 2021-02-26 ENCOUNTER — Inpatient Hospital Stay: Payer: Medicare PPO

## 2021-02-26 VITALS — BP 132/62 | HR 93 | Temp 97.8°F | Resp 20 | Wt 92.0 lb

## 2021-02-26 DIAGNOSIS — D508 Other iron deficiency anemias: Secondary | ICD-10-CM

## 2021-02-26 DIAGNOSIS — D509 Iron deficiency anemia, unspecified: Secondary | ICD-10-CM | POA: Diagnosis not present

## 2021-02-26 DIAGNOSIS — Z79899 Other long term (current) drug therapy: Secondary | ICD-10-CM | POA: Diagnosis not present

## 2021-02-26 MED ORDER — SODIUM CHLORIDE 0.9 % IV SOLN: Freq: Once | INTRAVENOUS | Status: AC

## 2021-02-26 MED ORDER — SODIUM CHLORIDE 0.9 % IV SOLN
200.0000 mg | Freq: Once | INTRAVENOUS | Status: AC
  Administered 2021-02-26: 200 mg via INTRAVENOUS
  Filled 2021-02-26: qty 200

## 2021-02-26 NOTE — Patient Instructions (Signed)

## 2021-02-26 NOTE — Telephone Encounter (Signed)
Faxed signed order to [phone], phone [phone]  [phone]   PT effective 03/03/2021 1wk2

## 2021-02-26 NOTE — Telephone Encounter (Signed)
Faxed signed orders on 02/14/2021 to [facility] [phone], phone [phone]  Order # [id number]  OT effective 02/10/2021 1 every [id number]

## 2021-03-01 DIAGNOSIS — J449 Chronic obstructive pulmonary disease, unspecified: Secondary | ICD-10-CM | POA: Diagnosis not present

## 2021-03-01 DIAGNOSIS — D509 Iron deficiency anemia, unspecified: Secondary | ICD-10-CM | POA: Diagnosis not present

## 2021-03-01 DIAGNOSIS — F419 Anxiety disorder, unspecified: Secondary | ICD-10-CM | POA: Diagnosis not present

## 2021-03-01 DIAGNOSIS — M545 Low back pain, unspecified: Secondary | ICD-10-CM | POA: Diagnosis not present

## 2021-03-01 DIAGNOSIS — I700 Atherosclerosis of aorta: Secondary | ICD-10-CM | POA: Diagnosis not present

## 2021-03-01 DIAGNOSIS — F32A Depression, unspecified: Secondary | ICD-10-CM | POA: Diagnosis not present

## 2021-03-01 DIAGNOSIS — I352 Nonrheumatic aortic (valve) stenosis with insufficiency: Secondary | ICD-10-CM | POA: Diagnosis not present

## 2021-03-01 DIAGNOSIS — R627 Adult failure to thrive: Secondary | ICD-10-CM | POA: Diagnosis not present

## 2021-03-01 DIAGNOSIS — G8929 Other chronic pain: Secondary | ICD-10-CM | POA: Diagnosis not present

## 2021-03-07 DIAGNOSIS — G8929 Other chronic pain: Secondary | ICD-10-CM | POA: Diagnosis not present

## 2021-03-07 DIAGNOSIS — R110 Nausea: Secondary | ICD-10-CM | POA: Diagnosis not present

## 2021-03-07 DIAGNOSIS — G3184 Mild cognitive impairment, so stated: Secondary | ICD-10-CM | POA: Diagnosis not present

## 2021-03-07 DIAGNOSIS — M545 Low back pain, unspecified: Secondary | ICD-10-CM | POA: Diagnosis not present

## 2021-03-07 DIAGNOSIS — G4700 Insomnia, unspecified: Secondary | ICD-10-CM | POA: Diagnosis not present

## 2021-03-07 DIAGNOSIS — F419 Anxiety disorder, unspecified: Secondary | ICD-10-CM | POA: Diagnosis not present

## 2021-03-07 DIAGNOSIS — I352 Nonrheumatic aortic (valve) stenosis with insufficiency: Secondary | ICD-10-CM | POA: Diagnosis not present

## 2021-03-07 DIAGNOSIS — R030 Elevated blood-pressure reading, without diagnosis of hypertension: Secondary | ICD-10-CM | POA: Diagnosis not present

## 2021-03-07 DIAGNOSIS — D509 Iron deficiency anemia, unspecified: Secondary | ICD-10-CM | POA: Diagnosis not present

## 2021-03-07 DIAGNOSIS — M1990 Unspecified osteoarthritis, unspecified site: Secondary | ICD-10-CM | POA: Diagnosis not present

## 2021-03-07 DIAGNOSIS — F32A Depression, unspecified: Secondary | ICD-10-CM | POA: Diagnosis not present

## 2021-03-07 DIAGNOSIS — J449 Chronic obstructive pulmonary disease, unspecified: Secondary | ICD-10-CM | POA: Diagnosis not present

## 2021-03-07 DIAGNOSIS — R627 Adult failure to thrive: Secondary | ICD-10-CM | POA: Diagnosis not present

## 2021-03-07 DIAGNOSIS — J439 Emphysema, unspecified: Secondary | ICD-10-CM | POA: Diagnosis not present

## 2021-03-07 DIAGNOSIS — F324 Major depressive disorder, single episode, in partial remission: Secondary | ICD-10-CM | POA: Diagnosis not present

## 2021-03-07 DIAGNOSIS — I700 Atherosclerosis of aorta: Secondary | ICD-10-CM | POA: Diagnosis not present

## 2021-03-11 DIAGNOSIS — F419 Anxiety disorder, unspecified: Secondary | ICD-10-CM | POA: Diagnosis not present

## 2021-03-11 DIAGNOSIS — M545 Low back pain, unspecified: Secondary | ICD-10-CM | POA: Diagnosis not present

## 2021-03-11 DIAGNOSIS — R627 Adult failure to thrive: Secondary | ICD-10-CM | POA: Diagnosis not present

## 2021-03-11 DIAGNOSIS — G8929 Other chronic pain: Secondary | ICD-10-CM | POA: Diagnosis not present

## 2021-03-11 DIAGNOSIS — F32A Depression, unspecified: Secondary | ICD-10-CM | POA: Diagnosis not present

## 2021-03-11 DIAGNOSIS — I352 Nonrheumatic aortic (valve) stenosis with insufficiency: Secondary | ICD-10-CM | POA: Diagnosis not present

## 2021-03-11 DIAGNOSIS — J449 Chronic obstructive pulmonary disease, unspecified: Secondary | ICD-10-CM | POA: Diagnosis not present

## 2021-03-11 DIAGNOSIS — I700 Atherosclerosis of aorta: Secondary | ICD-10-CM | POA: Diagnosis not present

## 2021-03-11 DIAGNOSIS — D509 Iron deficiency anemia, unspecified: Secondary | ICD-10-CM | POA: Diagnosis not present

## 2021-03-12 DIAGNOSIS — S63659A Sprain of metacarpophalangeal joint of unspecified finger, initial encounter: Secondary | ICD-10-CM | POA: Diagnosis not present

## 2021-03-12 DIAGNOSIS — M25641 Stiffness of right hand, not elsewhere classified: Secondary | ICD-10-CM | POA: Diagnosis not present

## 2021-03-12 DIAGNOSIS — M79641 Pain in right hand: Secondary | ICD-10-CM | POA: Diagnosis not present

## 2021-03-12 DIAGNOSIS — M65331 Trigger finger, right middle finger: Secondary | ICD-10-CM | POA: Diagnosis not present

## 2021-03-12 DIAGNOSIS — M65351 Trigger finger, right little finger: Secondary | ICD-10-CM | POA: Diagnosis not present

## 2021-03-12 DIAGNOSIS — S63659D Sprain of metacarpophalangeal joint of unspecified finger, subsequent encounter: Secondary | ICD-10-CM | POA: Diagnosis not present

## 2021-03-14 ENCOUNTER — Telehealth: Payer: Self-pay | Admitting: Internal Medicine

## 2021-03-14 NOTE — Telephone Encounter (Signed)
Faxed Signed order to [facility] [phone] Updated Med profile  Order # [id number]

## 2021-03-19 DIAGNOSIS — M25641 Stiffness of right hand, not elsewhere classified: Secondary | ICD-10-CM | POA: Diagnosis not present

## 2021-03-19 DIAGNOSIS — S63659A Sprain of metacarpophalangeal joint of unspecified finger, initial encounter: Secondary | ICD-10-CM | POA: Diagnosis not present

## 2021-03-19 DIAGNOSIS — M79641 Pain in right hand: Secondary | ICD-10-CM | POA: Diagnosis not present

## 2021-03-19 DIAGNOSIS — M65351 Trigger finger, right little finger: Secondary | ICD-10-CM | POA: Diagnosis not present

## 2021-03-19 DIAGNOSIS — S63659D Sprain of metacarpophalangeal joint of unspecified finger, subsequent encounter: Secondary | ICD-10-CM | POA: Diagnosis not present

## 2021-03-23 ENCOUNTER — Other Ambulatory Visit: Payer: Self-pay | Admitting: Internal Medicine

## 2021-03-25 ENCOUNTER — Other Ambulatory Visit: Payer: Self-pay | Admitting: Physician Assistant

## 2021-03-25 DIAGNOSIS — D508 Other iron deficiency anemias: Secondary | ICD-10-CM

## 2021-03-26 ENCOUNTER — Other Ambulatory Visit: Payer: Self-pay

## 2021-03-26 ENCOUNTER — Inpatient Hospital Stay: Payer: Medicare PPO | Attending: Physician Assistant | Admitting: Physician Assistant

## 2021-03-26 ENCOUNTER — Inpatient Hospital Stay: Payer: Medicare PPO

## 2021-03-26 VITALS — BP 110/57 | HR 98 | Temp 97.6°F | Resp 16 | Ht 60.0 in | Wt 94.3 lb

## 2021-03-26 DIAGNOSIS — E785 Hyperlipidemia, unspecified: Secondary | ICD-10-CM | POA: Diagnosis not present

## 2021-03-26 DIAGNOSIS — F1011 Alcohol abuse, in remission: Secondary | ICD-10-CM | POA: Diagnosis not present

## 2021-03-26 DIAGNOSIS — D508 Other iron deficiency anemias: Secondary | ICD-10-CM | POA: Diagnosis not present

## 2021-03-26 DIAGNOSIS — Z87891 Personal history of nicotine dependence: Secondary | ICD-10-CM | POA: Diagnosis not present

## 2021-03-26 DIAGNOSIS — Z86010 Personal history of colonic polyps: Secondary | ICD-10-CM | POA: Insufficient documentation

## 2021-03-26 DIAGNOSIS — F329 Major depressive disorder, single episode, unspecified: Secondary | ICD-10-CM | POA: Diagnosis not present

## 2021-03-26 DIAGNOSIS — K449 Diaphragmatic hernia without obstruction or gangrene: Secondary | ICD-10-CM | POA: Diagnosis not present

## 2021-03-26 DIAGNOSIS — J449 Chronic obstructive pulmonary disease, unspecified: Secondary | ICD-10-CM | POA: Diagnosis not present

## 2021-03-26 DIAGNOSIS — E538 Deficiency of other specified B group vitamins: Secondary | ICD-10-CM | POA: Insufficient documentation

## 2021-03-26 DIAGNOSIS — D509 Iron deficiency anemia, unspecified: Secondary | ICD-10-CM | POA: Diagnosis not present

## 2021-03-26 DIAGNOSIS — Z8711 Personal history of peptic ulcer disease: Secondary | ICD-10-CM | POA: Diagnosis not present

## 2021-03-26 DIAGNOSIS — D649 Anemia, unspecified: Secondary | ICD-10-CM | POA: Diagnosis not present

## 2021-03-26 LAB — CBC WITH DIFFERENTIAL (CANCER CENTER ONLY)
Abs Immature Granulocytes: 0.01 10*3/uL (ref 0.00–0.07)
Basophils Absolute: 0.1 10*3/uL (ref 0.0–0.1)
Basophils Relative: 1 %
Eosinophils Absolute: 0.2 10*3/uL (ref 0.0–0.5)
Eosinophils Relative: 3 %
HCT: 38.4 % (ref 36.0–46.0)
Hemoglobin: 12 g/dL (ref 12.0–15.0)
Immature Granulocytes: 0 %
Lymphocytes Relative: 16 %
Lymphs Abs: 1.2 10*3/uL (ref 0.7–4.0)
MCH: 27.8 pg (ref 26.0–34.0)
MCHC: 31.3 g/dL (ref 30.0–36.0)
MCV: 89.1 fL (ref 80.0–100.0)
Monocytes Absolute: 0.5 10*3/uL (ref 0.1–1.0)
Monocytes Relative: 7 %
Neutro Abs: 5.9 10*3/uL (ref 1.7–7.7)
Neutrophils Relative %: 73 %
Platelet Count: 175 10*3/uL (ref 150–400)
RBC: 4.31 MIL/uL (ref 3.87–5.11)
RDW: 15.3 % (ref 11.5–15.5)
WBC Count: 8 10*3/uL (ref 4.0–10.5)
nRBC: 0 % (ref 0.0–0.2)

## 2021-03-26 LAB — RETIC PANEL
Immature Retic Fract: 4.6 % (ref 2.3–15.9)
RBC.: 4.33 MIL/uL (ref 3.87–5.11)
Retic Count, Absolute: 26.4 10*3/uL (ref 19.0–186.0)
Retic Ct Pct: 0.6 % (ref 0.4–3.1)
Reticulocyte Hemoglobin: 31.5 pg (ref >27.9)

## 2021-03-26 LAB — IRON AND TIBC
Iron: 64 ug/dL (ref 41–142)
Saturation Ratios: 27 % (ref 21–57)
TIBC: 233 ug/dL — ABNORMAL LOW (ref 236–444)
UIBC: 169 ug/dL (ref 120–384)

## 2021-03-26 LAB — FERRITIN: Ferritin: 290 ng/mL (ref 11–307)

## 2021-03-26 NOTE — Progress Notes (Signed)
[place] [phone]   Fax:(336) [phone]  PROGRESS NOTE  Patient Care Team: Elby Showers, MD as PCP - General (Internal Medicine)  Hematological/Oncological History 1) Labs from PCP, Dr. Gerome Apley -08/03/2020: WBC 11.0 (H), Hgb 10.8 (L), MCV 92.5, Plt 193 -09/04/2020: WBC 5.6, Hgb 10.5 (L), MCV 88.4, Plt 235, Iron 96, TIBC 306, Iron Saturation 31%, Ferritin 17 (L) -11/27/2020: WBC 5.9, Hgb 10.6 (L), MCV 90.6,Plt 223, Iron 24 (L), TIBC 322, Iron Saturation 7% (L), Ferritin 12 (L), Vitamin B12 663, Folate 18.2 -01/01/2021: WBC 6.2, Hgb 9.4 (L), MCV 91.7, Plt 281, Iron 85, TIBC 374, Iron saturation 23%, Ferritin 11 (L)  2) 01/22/2021: Establish care with Dede Query PA-C  3) 01/29/2021-02/26/2021: Received IV venofer x 5 doses  CHIEF COMPLAINTS/PURPOSE OF CONSULTATION:  "Iron deficiency anemia "  HISTORY OF PRESENTING ILLNESS:  Katherine [name] 87 y.o. female returns for a follow up for iron deficiency anemia. Patient is accompanied by her husband for this visit. She reports her energy levels have improved some since receiving IV iron. She can complete her basic ADLs on her own. She uses a walker to help with ambulation. She reports her weight and appetite are overall stable. She reports nausea in the morning but denies any vomiting episodes. She has constipation that she feels is secondary to the pain medication she takes daily. She notes having a daily bowel movements. She denies easy bruising or signs of bleeding. She has stable shortness of breath mainly with exertion secondary to COPD.  She denies any fevers, chills, night sweats, chest pain or cough. She has no other complaints. Rest of the 10 point ROS is below.   MEDICAL HISTORY:  Past Medical History:  Diagnosis Date   Alcoholic ([place])    36 years sober   Allergy    Anemia    Arthritis    Asthma    patient denies   B12 deficiency    COPD (chronic obstructive pulmonary disease) ([place])     Depression    not recent   Diverticulosis    Esophageal stricture    Headache    cluster headaches occasionally   Hepatic cyst 01/30/10   Hiatal hernia 1985   patient unaware   Hx of adenomatous colonic polyps 2006   Hyperlipidemia    IBS (irritable bowel syndrome)    PONV (postoperative nausea and vomiting)    no nausea or vomitting after surgery 07/2014   Positive PPD    PUD (peptic ulcer disease)    Shortness of breath dyspnea    SOB WITH EXERTION   (NOT NEW)    SURGICAL HISTORY: Past Surgical History:  Procedure Laterality Date   ABDOMINAL HYSTERECTOMY     abnormal bleeding   APPENDECTOMY     BARTHOLIN GLAND CYST EXCISION     x2   BREAST BIOPSY Bilateral    Milk glnd   CATARACT EXTRACTION W/ INTRAOCULAR LENS  IMPLANT, BILATERAL Bilateral    COLONOSCOPY     COLONOSCOPY W/ POLYPECTOMY     HARDWARE REMOVAL N/A 12/13/2015   Procedure: Removal of HARDWARE  Lumbar Five-Sacral One ;  Surgeon: Kary Kos, MD;  Location: [place] [facility];  Service: Neurosurgery;  Laterality: N/A;   IR KYPHO LUMBAR INC FX REDUCE BONE BX UNI/BIL CANNULATION INC/IMAGING  07/13/2019   UPPER GI ENDOSCOPY     VAGOTOMY AND PYLOROPLASTY     in [place]    SOCIAL HISTORY: Social History   Socioeconomic History  Marital status: Married    Spouse name: Not on file   Number of children: 2   Years of education: Not on file   Highest education level: Not on file  Occupational History    Employer: RETIRED  Tobacco Use   Smoking status: Former    Years: 60.00    Types: Cigarettes    Quit date: 02/02/2010    Years since quitting: 11.1   Smokeless tobacco: Never  Substance and Sexual Activity   Alcohol use: No    Comment: recovering alcoholic     35 YRS     Drug use: No   Sexual activity: Not on file  Other Topics Concern   Not on file  Social History Narrative   Daily caffeine    Social Determinants of Health   Financial Resource Strain: Not on file  Food Insecurity: Not on file   Transportation Needs: Not on file  Physical Activity: Not on file  Stress: Not on file  Social Connections: Not on file  Intimate Partner Violence: Not on file    FAMILY HISTORY: Family History  Problem Relation Age of Onset   Ulcers Father    Stroke Father    Irritable bowel syndrome Father    Ovarian cancer Sister    Colon cancer Neg Hx     ALLERGIES:  is allergic to aspirin.  MEDICATIONS:  Current Outpatient Medications  Medication Sig Dispense Refill   albuterol (VENTOLIN HFA) 108 (90 Base) MCG/ACT inhaler INHALE 2 PUFFS INTO THE LUNGS 4 (FOUR) TIMES DAILY AS NEEDED FOR WHEEZING OR SHORTNESS OF BREATH. 36 each 2   escitalopram (LEXAPRO) 5 MG tablet TAKE 1 TABLET BY MOUTH EVERY DAY 90 tablet 2   ferrous sulfate 325 (65 FE) MG EC tablet Take 1 tablet by mouth once every other day with a glass of orange juice. 30 tablet 3   ipratropium (ATROVENT) 0.02 % nebulizer solution TAKE 2.5 MLS (0.5 MG TOTAL) BY NEBULIZATION 4 (FOUR) TIMES DAILY. 62.5 mL 12   traMADol (ULTRAM) 50 MG tablet Take 50 mg by mouth every 6 (six) hours as needed for moderate pain.      traZODone (DESYREL) 50 MG tablet TAKE 3 TABLETS BY MOUTH AT BEDTIME 270 tablet 0   ALPRAZolam (XANAX) 0.25 MG tablet TAKE ONE TAB UP TO TWICE DAILY AS NEEDED FOR ANXIETY AND SLEEP (Patient not taking: Reported on 03/26/2021) 60 tablet 2   No current facility-administered medications for this visit.    REVIEW OF SYSTEMS:   Constitutional: ( - ) fevers, ( - )  chills , ( - ) night sweats Eyes: ( - ) blurriness of vision, ( - ) double vision, ( - ) watery eyes Ears, nose, mouth, throat, and face: ( - ) mucositis, ( - ) sore throat Respiratory: ( - ) cough, ( +) dyspnea, ( - ) wheezes Cardiovascular: ( - ) palpitation, ( - ) chest discomfort, ( - ) lower extremity swelling Gastrointestinal:  ( - ) nausea, ( - ) heartburn, ( - ) change in bowel habits Skin: ( - ) abnormal skin rashes Lymphatics: ( - ) new lymphadenopathy, ( - )  easy bruising Neurological: ( - ) numbness, ( - ) tingling, ( - ) new weaknesses Behavioral/Psych: ( - ) mood change, ( - ) new changes  All other systems were reviewed with the patient and are negative.  PHYSICAL EXAMINATION: ECOG PERFORMANCE STATUS: 1 - Symptomatic but completely ambulatory  Vitals:   03/26/21 1449  BP: Marland Kitchen)  110/57  Pulse: 98  Resp: 16  Temp: 97.6 F (36.4 C)  SpO2: 95%   Filed Weights   03/26/21 1449  Weight: 94 lb 4.8 oz (42.8 kg)    GENERAL: well appearing female in NAD  SKIN: skin color, texture, turgor are normal, no rashes or significant lesions EYES: conjunctiva are pink and non-injected, sclera clear LUNGS: clear to auscultation and percussion with normal breathing effort HEART: regular rate & rhythm and no murmurs and no lower extremity edema ABDOMEN: soft, non-tender, non-distended, normal bowel sounds Musculoskeletal: no cyanosis of digits and no clubbing  PSYCH: alert & oriented x 3, fluent speech NEURO: no focal motor/sensory deficits  LABORATORY DATA:  I have reviewed the data as listed CBC Latest Ref Rng & Units 03/26/2021 01/22/2021 01/01/2021  WBC 4.0 - 10.5 K/uL 8.0 6.0 6.2  Hemoglobin 12.0 - 15.0 g/dL 12.0 9.1(L) 9.4(L)  Hematocrit 36.0 - 46.0 % 38.4 28.7(L) 29.7(L)  Platelets 150 - 400 K/uL 175 233 281    CMP Latest Ref Rng & Units 01/22/2021 11/27/2020 09/04/2020  Glucose 70 - 99 mg/dL 107(H) 97 78  BUN 8 - 23 mg/dL 24(H) 18 14  Creatinine 0.44 - 1.00 mg/dL 1.18(H) 0.99(H) 1.05(H)  Sodium 135 - 145 mmol/L 141 143 142  Potassium 3.5 - 5.1 mmol/L 4.3 3.7 4.6  Chloride 98 - 111 mmol/L 108 108 106  CO2 22 - 32 mmol/L 25 27 28   Calcium 8.9 - 10.3 mg/dL 9.7 9.2 8.9  Total Protein 6.5 - 8.1 g/dL 6.4(L) 5.9(L) 5.8(L)  Total Bilirubin 0.3 - 1.2 mg/dL <0.2(L) 0.3 0.3  Alkaline Phos 38 - 126 U/L 65 - -  AST 15 - 41 U/L 24 24 21   ALT 0 - 44 U/L 10 10 9     ASSESSMENT & PLAN Lilas Diefendorf Lorson is a 87 y.o. female who returns for a follow up  for iron deficiency anemia.   #Iron deficiency anemia: --Etiology unknown. Made referral to GI but patient reports not going for a consultation --Patient has history of PUD and recent taking daily dose of advil for recent hand injury with surgery. Advised to limit or discontinue advil until we rule out GI bleed.  --Patient received IV venofer x 5 doses from 01/29/2021-02/26/2021.  --Labs today show iron deficiency anemia has resolved. Hgb is back to normal. Iron panel shows ferritin has improved from 8 to 290. --Recommend to continue ferrous sulfate 325 mg once every other day with a glass of orange juice.  --Discussed needing to be evaluated by gastroenterology if anemia returns.  --RTC in 6 months with labs   No orders of the defined types were placed in this encounter.   All questions were answered. The patient knows to call the clinic with any problems, questions or concerns.  I have spent a total of 25 minutes minutes of face-to-face and non-face-to-face time, preparing to see the patient, performing a medically appropriate examination, counseling and educating the patient,  documenting clinical information in the electronic health record, and care coordination.    Dede Query, PA-C Department of Hematology/Oncology [place] at [facility] Phone: [phone]

## 2021-03-27 DIAGNOSIS — S63659D Sprain of metacarpophalangeal joint of unspecified finger, subsequent encounter: Secondary | ICD-10-CM | POA: Diagnosis not present

## 2021-03-27 DIAGNOSIS — M79641 Pain in right hand: Secondary | ICD-10-CM | POA: Diagnosis not present

## 2021-03-27 DIAGNOSIS — M65351 Trigger finger, right little finger: Secondary | ICD-10-CM | POA: Diagnosis not present

## 2021-03-27 DIAGNOSIS — M65331 Trigger finger, right middle finger: Secondary | ICD-10-CM | POA: Diagnosis not present

## 2021-03-27 DIAGNOSIS — M25641 Stiffness of right hand, not elsewhere classified: Secondary | ICD-10-CM | POA: Diagnosis not present

## 2021-04-02 ENCOUNTER — Other Ambulatory Visit: Payer: Medicare PPO | Admitting: Internal Medicine

## 2021-04-02 ENCOUNTER — Other Ambulatory Visit: Payer: Self-pay

## 2021-04-02 DIAGNOSIS — Z0000 Encounter for general adult medical examination without abnormal findings: Secondary | ICD-10-CM | POA: Diagnosis not present

## 2021-04-02 DIAGNOSIS — M65351 Trigger finger, right little finger: Secondary | ICD-10-CM | POA: Diagnosis not present

## 2021-04-02 DIAGNOSIS — M25641 Stiffness of right hand, not elsewhere classified: Secondary | ICD-10-CM | POA: Diagnosis not present

## 2021-04-02 DIAGNOSIS — E785 Hyperlipidemia, unspecified: Secondary | ICD-10-CM

## 2021-04-02 DIAGNOSIS — S63659D Sprain of metacarpophalangeal joint of unspecified finger, subsequent encounter: Secondary | ICD-10-CM | POA: Diagnosis not present

## 2021-04-02 DIAGNOSIS — R627 Adult failure to thrive: Secondary | ICD-10-CM

## 2021-04-02 DIAGNOSIS — M65331 Trigger finger, right middle finger: Secondary | ICD-10-CM | POA: Diagnosis not present

## 2021-04-02 DIAGNOSIS — M79641 Pain in right hand: Secondary | ICD-10-CM | POA: Diagnosis not present

## 2021-04-02 NOTE — Progress Notes (Signed)
Lab only 

## 2021-04-03 LAB — LIPID PANEL
Cholesterol: 199 mg/dL (ref <200)
HDL: 69 mg/dL (ref >=50)
LDL Cholesterol (Calc): 110 mg/dL (calc) — ABNORMAL HIGH
Non-HDL Cholesterol (Calc): 130 mg/dL (calc) — ABNORMAL HIGH (ref <130)
Total CHOL/HDL Ratio: 2.9 (calc) (ref <5.0)
Triglycerides: 96 mg/dL (ref <150)

## 2021-04-03 LAB — TSH: TSH: 2.28 mIU/L (ref 0.40–4.50)

## 2021-04-04 ENCOUNTER — Other Ambulatory Visit: Payer: Self-pay

## 2021-04-04 ENCOUNTER — Ambulatory Visit: Payer: Medicare PPO | Admitting: Internal Medicine

## 2021-04-04 ENCOUNTER — Encounter: Payer: Self-pay | Admitting: Internal Medicine

## 2021-04-04 VITALS — BP 118/78 | HR 68 | Temp 97.7°F | Ht 60.0 in | Wt 93.0 lb

## 2021-04-04 DIAGNOSIS — I350 Nonrheumatic aortic (valve) stenosis: Secondary | ICD-10-CM | POA: Diagnosis not present

## 2021-04-04 DIAGNOSIS — F419 Anxiety disorder, unspecified: Secondary | ICD-10-CM

## 2021-04-04 DIAGNOSIS — D509 Iron deficiency anemia, unspecified: Secondary | ICD-10-CM | POA: Diagnosis not present

## 2021-04-04 DIAGNOSIS — F32A Depression, unspecified: Secondary | ICD-10-CM

## 2021-04-04 DIAGNOSIS — R54 Age-related physical debility: Secondary | ICD-10-CM

## 2021-04-04 DIAGNOSIS — J449 Chronic obstructive pulmonary disease, unspecified: Secondary | ICD-10-CM | POA: Diagnosis not present

## 2021-04-04 DIAGNOSIS — Z9889 Other specified postprocedural states: Secondary | ICD-10-CM

## 2021-04-15 DIAGNOSIS — M65351 Trigger finger, right little finger: Secondary | ICD-10-CM | POA: Diagnosis not present

## 2021-04-15 DIAGNOSIS — M25641 Stiffness of right hand, not elsewhere classified: Secondary | ICD-10-CM | POA: Diagnosis not present

## 2021-04-15 DIAGNOSIS — M79641 Pain in right hand: Secondary | ICD-10-CM | POA: Diagnosis not present

## 2021-04-15 DIAGNOSIS — S63659D Sprain of metacarpophalangeal joint of unspecified finger, subsequent encounter: Secondary | ICD-10-CM | POA: Diagnosis not present

## 2021-04-16 DIAGNOSIS — Z79891 Long term (current) use of opiate analgesic: Secondary | ICD-10-CM | POA: Diagnosis not present

## 2021-04-16 DIAGNOSIS — M47816 Spondylosis without myelopathy or radiculopathy, lumbar region: Secondary | ICD-10-CM | POA: Diagnosis not present

## 2021-04-16 DIAGNOSIS — M47818 Spondylosis without myelopathy or radiculopathy, sacral and sacrococcygeal region: Secondary | ICD-10-CM | POA: Diagnosis not present

## 2021-04-16 DIAGNOSIS — M961 Postlaminectomy syndrome, not elsewhere classified: Secondary | ICD-10-CM | POA: Diagnosis not present

## 2021-04-16 DIAGNOSIS — M4316 Spondylolisthesis, lumbar region: Secondary | ICD-10-CM | POA: Diagnosis not present

## 2021-04-16 DIAGNOSIS — G894 Chronic pain syndrome: Secondary | ICD-10-CM | POA: Diagnosis not present

## 2021-04-19 ENCOUNTER — Telehealth: Payer: Self-pay | Admitting: Emergency Medicine

## 2021-04-19 DIAGNOSIS — J449 Chronic obstructive pulmonary disease, unspecified: Secondary | ICD-10-CM

## 2021-04-20 MED ORDER — ALBUTEROL SULFATE HFA 108 (90 BASE) MCG/ACT IN AERS: INHALATION_SPRAY | RESPIRATORY_TRACT | 2 refills | Status: DC

## 2021-04-20 NOTE — Telephone Encounter (Signed)
I have called the pts husband and he stated that they have never received the supplies for the neb from any company.  The last time they got a neb supplies from our office.  Will forward back to the office to see if he can get a nebulizer kit.

## 2021-04-22 NOTE — Telephone Encounter (Signed)
We may have given one kit previously, but are not able to give more as this is not appropriate. I have placed new DME order. I called pt and there was no answer- LMTCB.

## 2021-04-23 DIAGNOSIS — M65331 Trigger finger, right middle finger: Secondary | ICD-10-CM | POA: Diagnosis not present

## 2021-04-23 DIAGNOSIS — M25641 Stiffness of right hand, not elsewhere classified: Secondary | ICD-10-CM | POA: Diagnosis not present

## 2021-04-23 DIAGNOSIS — S63652D Sprain of metacarpophalangeal joint of right middle finger, subsequent encounter: Secondary | ICD-10-CM | POA: Diagnosis not present

## 2021-04-23 DIAGNOSIS — M79641 Pain in right hand: Secondary | ICD-10-CM | POA: Diagnosis not present

## 2021-04-30 DIAGNOSIS — M79641 Pain in right hand: Secondary | ICD-10-CM | POA: Diagnosis not present

## 2021-04-30 DIAGNOSIS — M24541 Contracture, right hand: Secondary | ICD-10-CM | POA: Diagnosis not present

## 2021-04-30 DIAGNOSIS — S63659A Sprain of metacarpophalangeal joint of unspecified finger, initial encounter: Secondary | ICD-10-CM | POA: Diagnosis not present

## 2021-04-30 DIAGNOSIS — M65331 Trigger finger, right middle finger: Secondary | ICD-10-CM | POA: Diagnosis not present

## 2021-05-07 NOTE — Telephone Encounter (Signed)
Attempted to call pt's spouse [name] to see if pt ever received supplies for nebulizer but unable to reach. Left message for him to return call. Due to multiple attempts trying to reach and unable to do so, per protocol encounter will be closed.

## 2021-05-12 NOTE — Patient Instructions (Addendum)
It was a pleasure to see you today.  Continue oral iron supplementation.  Have booked Medicare wellness visit for June 2020.  No change in other medications.

## 2021-05-12 NOTE — Progress Notes (Signed)
   Subjective:    Patient ID: Katherine [name], female    DOB: [birth year], 87 y.o.   MRN: [id number]  HPI 87 year old Female seen for follow-up on iron deficiency anemia, appetite, COPD, depression, hyperlipidemia, musculoskeletal pain. Accompanied by her husband, [name]. They continue to reside in town home. They had considered moving to a retirement facility but have not done so. Has stair elevator to get upstairs. He had left acetabular fracture with a fall but has recovered.  She currently is not on any cholesterol-lowering medication.  At her age it probably does not matter.  Her LDL is 110.  Total cholesterol was 199, HDL 69 and triglycerides 96.  Hemoglobin has improved from 9.1 g to 12 g.  Iron level was 24 in June and is now 29.  Received iron infusions at the cancer center x 5 from August 16 through June 13.  She is to be seen there again in March.  Is to be taking ferrous sulfate 325 mg once a day.  Gastroenterology evaluation was not done due to age.  If anemia returns, it is suggested she have GI evaluation.  Had COVID booster in September.  Also had flu vaccine in September.  She had hand surgery by Dr. Burney Gauze on the right ring and long fingers due to ruptures of sagittal bands.  Continues to have issues status post surgery.  Pleased that she has gained weight.  Now weighs 93 pounds and got down as low as 84 pounds in June.  Receives palliative care services at home.         Review of Systems no new complaints     Objective:   Physical Exam Blood pressure 118/78 pulse 68 temperature 97.7 pulse oximetry 97% weight 93 pounds BMI 18.16  Skin: Warm and dry.  No thyromegaly or carotid bruits.  Chest is clear.  Cardiac exam: Regular rate and rhythm.  No lower extremity edema.  She is alert and cooperative.  Able to give clear history.       Assessment & Plan:  Iron deficiency anemia-improved.  To be reevaluated in March by Hematology.  He is to take oral iron at  home.  COPD-stable with Ventolin and Atrovent  Continues to have issues with right ring and right long finger.  She is seeing Dr. Burney Gauze.   Generalized weakness due to age and medical issues  History of depression treated with Lexapro and Desyrel.  Anxiety treated with Xanax 0.25 mg up to twice daily if needed  Remote history of peptic ulcer disease-no symptoms of this at this time  History of moderate aortic stenosis  Have booked Medicare wellness visit for June 2023.  May return sooner if needed.

## 2021-05-13 ENCOUNTER — Telehealth: Payer: Self-pay | Admitting: Emergency Medicine

## 2021-05-13 NOTE — Telephone Encounter (Signed)
Please see 04/29/2021 phone note.  Spoke to patient's spouse, [name](DPR). [name] stated that patient did receive nebulizer supplies. Nothing further needed at this time.

## 2021-05-17 ENCOUNTER — Telehealth: Payer: Self-pay | Admitting: Internal Medicine

## 2021-05-17 NOTE — Telephone Encounter (Signed)
Scheduled

## 2021-05-17 NOTE — Telephone Encounter (Signed)
Katherine [name] [place] [phone]  Katherine [name] called to say [name] is having surgery next Wednesday and she needs a EKG done prior to that, and they told them to call her PCP to get this done.

## 2021-05-20 ENCOUNTER — Ambulatory Visit: Payer: Medicare PPO | Admitting: Internal Medicine

## 2021-05-20 ENCOUNTER — Other Ambulatory Visit: Payer: Self-pay

## 2021-05-20 DIAGNOSIS — I350 Nonrheumatic aortic (valve) stenosis: Secondary | ICD-10-CM

## 2021-05-21 ENCOUNTER — Other Ambulatory Visit: Payer: Self-pay

## 2021-05-21 ENCOUNTER — Ambulatory Visit (HOSPITAL_COMMUNITY)
Admission: RE | Admit: 2021-05-21 | Discharge: 2021-05-21 | Disposition: A | Discharge status: Home / Self Care | Payer: Medicare PPO | Source: Ambulatory Visit | Attending: Internal Medicine | Admitting: Internal Medicine

## 2021-05-21 DIAGNOSIS — I350 Nonrheumatic aortic (valve) stenosis: Secondary | ICD-10-CM | POA: Diagnosis not present

## 2021-05-21 DIAGNOSIS — J449 Chronic obstructive pulmonary disease, unspecified: Secondary | ICD-10-CM | POA: Diagnosis not present

## 2021-05-21 DIAGNOSIS — I080 Rheumatic disorders of both mitral and aortic valves: Secondary | ICD-10-CM | POA: Insufficient documentation

## 2021-05-21 DIAGNOSIS — E785 Hyperlipidemia, unspecified: Secondary | ICD-10-CM | POA: Insufficient documentation

## 2021-05-21 LAB — ECHOCARDIOGRAM COMPLETE
AR max vel: 0.63 cm2
AV Area VTI: 0.62 cm2
AV Area mean vel: 0.59 cm2
AV Mean grad: 33.7 mmHg
AV Peak grad: 57 mmHg
Ao pk vel: 3.77 m/s
Area-P 1/2: 3.37 cm2
S' Lateral: 2.3 cm

## 2021-05-22 DIAGNOSIS — M24541 Contracture, right hand: Secondary | ICD-10-CM | POA: Diagnosis not present

## 2021-05-22 DIAGNOSIS — Z9889 Other specified postprocedural states: Secondary | ICD-10-CM | POA: Diagnosis not present

## 2021-05-23 NOTE — Progress Notes (Signed)
He made an appt today and she was scheduled for January 9.

## 2021-05-28 DIAGNOSIS — Z4789 Encounter for other orthopedic aftercare: Secondary | ICD-10-CM | POA: Diagnosis not present

## 2021-05-28 DIAGNOSIS — S63659A Sprain of metacarpophalangeal joint of unspecified finger, initial encounter: Secondary | ICD-10-CM | POA: Diagnosis not present

## 2021-05-28 DIAGNOSIS — M79641 Pain in right hand: Secondary | ICD-10-CM | POA: Diagnosis not present

## 2021-05-28 DIAGNOSIS — M25641 Stiffness of right hand, not elsewhere classified: Secondary | ICD-10-CM | POA: Diagnosis not present

## 2021-05-28 DIAGNOSIS — S63659D Sprain of metacarpophalangeal joint of unspecified finger, subsequent encounter: Secondary | ICD-10-CM | POA: Diagnosis not present

## 2021-05-30 ENCOUNTER — Telehealth: Payer: Self-pay | Admitting: Internal Medicine

## 2021-05-30 NOTE — Telephone Encounter (Signed)
Katherine [name] [phone]  Katherine Bouche dropped off a letter asking if he could bring Katherine [name] back here to manage her Tramadol, that they had to pay speciality fee at pain management (Dr Hardin Negus office) and only had 15 minute appointments.

## 2021-05-30 NOTE — Telephone Encounter (Signed)
After talking with Dr Renold Genta about letter it was decided that it is best that patient continue see Dr Hardin Negus at pain management for Tramadol medication.  Called and explained to [place] that this is a controled medication and it should be issued thru pain management. He verbalized understanding and Thank me for explaining.

## 2021-06-02 ENCOUNTER — Other Ambulatory Visit: Payer: Self-pay | Admitting: Internal Medicine

## 2021-06-03 ENCOUNTER — Other Ambulatory Visit (HOSPITAL_BASED_OUTPATIENT_CLINIC_OR_DEPARTMENT_OTHER): Payer: Medicare PPO

## 2021-06-21 ENCOUNTER — Other Ambulatory Visit: Payer: Self-pay | Admitting: Internal Medicine

## 2021-06-24 ENCOUNTER — Ambulatory Visit: Payer: Medicare PPO | Admitting: Cardiovascular Disease

## 2021-06-24 ENCOUNTER — Telehealth: Payer: Self-pay | Admitting: Internal Medicine

## 2021-06-24 ENCOUNTER — Other Ambulatory Visit: Payer: Self-pay

## 2021-06-24 ENCOUNTER — Encounter: Payer: Self-pay | Admitting: Cardiovascular Disease

## 2021-06-24 VITALS — BP 112/70 | HR 102 | Ht 60.0 in | Wt 94.0 lb

## 2021-06-24 DIAGNOSIS — I350 Nonrheumatic aortic (valve) stenosis: Secondary | ICD-10-CM | POA: Diagnosis not present

## 2021-06-24 NOTE — Progress Notes (Signed)
Structural Heart Clinic Consult Note  Chief Complaint  Patient presents with   New Patient (Initial Visit)    Severe aortic stenosis   History of Present Illness: 88 yo female with history of prior etoh abuse, anemia, arthritis, COPD, former tobacco abuse (stopped smoking in 2011), hyperlipidemia, IBS and new finding of severe aortic stenosis who is here today in the structural heart clinic as a new consult, referred by Dr. Renold Genta, for further discussion regarding her aortic stenosis and possible TAVR. She has been followed for moderate aortic stenosis by Dr. Renold Genta. Echo 05/21/21 with LVEF=60-65%, mild LVH. Degenerative mitral valve with mild mitral stenosis, trivial MR, moderate MAC. The aortic valve leaflets are thickened and calcified with poor leaflet mobility. Severe aortic stenosis with mean gradient 33.7 mmHg, peak gradient 57 mmHg, AVA 0.62 cm2, DI 0.20.   She tells me today that she has baseline dyspnea and has had this from her COPD for years but over the past few months her dyspnea has progressed. She uses a walker at home. She is now very dyspneic with even minimal activity such as taking a bath. No chest pain, dizziness, near syncope, ankle edema. She lives with her husband in [place]. She is retired from office work. She has upper dentures and no active dental issues with her remaining teeth.    Primary Care Physician: Elby Showers, MD   Past Medical History:  Diagnosis Date   Alcoholic ([place])    36 years sober   Allergy    Anemia    Aortic stenosis    Arthritis    Asthma    patient denies   B12 deficiency    COPD (chronic obstructive pulmonary disease) ([place])    Depression    not recent   Diverticulosis    Esophageal stricture    Headache    cluster headaches occasionally   Hepatic cyst 01/30/2010   Hiatal hernia 1985   patient unaware   Hx of adenomatous colonic polyps 2006   Hyperlipidemia    IBS (irritable bowel syndrome)    PONV (postoperative nausea  and vomiting)    no nausea or vomitting after surgery 07/2014   Positive PPD    PUD (peptic ulcer disease)    Shortness of breath dyspnea    SOB WITH EXERTION   (NOT NEW)    Past Surgical History:  Procedure Laterality Date   ABDOMINAL HYSTERECTOMY     abnormal bleeding   APPENDECTOMY     BARTHOLIN GLAND CYST EXCISION     x2   BREAST BIOPSY Bilateral    Milk glnd   CATARACT EXTRACTION W/ INTRAOCULAR LENS  IMPLANT, BILATERAL Bilateral    COLONOSCOPY     COLONOSCOPY W/ POLYPECTOMY     HARDWARE REMOVAL N/A 12/13/2015   Procedure: Removal of HARDWARE  Lumbar Five-Sacral One ;  Surgeon: Kary Kos, MD;  Location: [facility];  Service: Neurosurgery;  Laterality: N/A;   IR KYPHO LUMBAR INC FX REDUCE BONE BX UNI/BIL CANNULATION INC/IMAGING  07/13/2019   UPPER GI ENDOSCOPY     VAGOTOMY AND PYLOROPLASTY     in [place], [place]    Current Outpatient Medications  Medication Sig Dispense Refill   albuterol (VENTOLIN HFA) 108 (90 Base) MCG/ACT inhaler INHALE 2 PUFFS INTO THE LUNGS 4 (FOUR) TIMES DAILY AS NEEDED FOR WHEEZING OR SHORTNESS OF BREATH. 36 each 2   ALPRAZolam (XANAX) 0.25 MG tablet TAKE ONE TAB UP TO TWICE DAILY AS NEEDED FOR ANXIETY AND SLEEP 60 tablet  5   escitalopram (LEXAPRO) 5 MG tablet TAKE 1 TABLET BY MOUTH EVERY DAY 90 tablet 2   ipratropium (ATROVENT) 0.02 % nebulizer solution TAKE 2.5 MLS (0.5 MG TOTAL) BY NEBULIZATION 4 (FOUR) TIMES DAILY. 62.5 mL 12   Polysaccharide Iron Complex (POLYSACCHARIDE IRON PO) Take 1 capsule by mouth daily.     traMADol (ULTRAM) 50 MG tablet Take 50 mg by mouth every 6 (six) hours as needed for moderate pain.      traZODone (DESYREL) 50 MG tablet TAKE 3 TABLETS BY MOUTH AT BEDTIME 270 tablet 0   ferrous sulfate 325 (65 FE) MG EC tablet Take 1 tablet by mouth once every other day with a glass of orange juice. (Patient not taking: Reported on 06/24/2021) 30 tablet 3   No current facility-administered medications for this visit.    Allergies   Allergen Reactions   Aspirin Other (See Comments)    Hx of ulcers, stomach issues     Social History   Socioeconomic History   Marital status: Married    Spouse name: Not on file   Number of children: 2   Years of education: Not on file   Highest education level: Not on file  Occupational History    Employer: RETIRED   Occupation: Retired-office work  Tobacco Use   Smoking status: Former    Years: 60.00    Types: Cigarettes    Quit date: 02/02/2010    Years since quitting: 11.3   Smokeless tobacco: Never  Substance and Sexual Activity   Alcohol use: No    Comment: recovering alcoholic     40 YRS   Drug use: No   Sexual activity: Not on file  Other Topics Concern   Not on file  Social History Narrative   Daily caffeine    Social Determinants of Health   Financial Resource Strain: Not on file  Food Insecurity: Not on file  Transportation Needs: Not on file  Physical Activity: Not on file  Stress: Not on file  Social Connections: Not on file  Intimate Partner Violence: Not on file    Family History  Problem Relation Age of Onset   Ulcers Father    Stroke Father    Irritable bowel syndrome Father    Ovarian cancer Sister    Colon cancer Neg Hx     Review of Systems:  As stated in the HPI and otherwise negative.   BP 112/70    Pulse (!) 102    Ht 5' (1.524 m)    Wt 94 lb (42.6 kg)    SpO2 95%    BMI 18.36 kg/m   Physical Examination: General: Thin female in NAD.  HEENT: OP clear, mucus membranes moist  SKIN: warm, dry. No rashes. Neuro: No focal deficits  Musculoskeletal: Muscle strength 5/5 all ext  Psychiatric: Mood and affect normal  Neck: No JVD, no carotid bruits, no thyromegaly, no lymphadenopathy.  Lungs:Clear bilaterally, no wheezes, rhonci, crackles Cardiovascular: Regular rate and rhythm. Harsh systolic murmur.  Abdomen:Soft. Bowel sounds present. Non-tender.  Extremities: No lower extremity edema. Pulses are 2 + in the bilateral  DP/PT.  EKG:  EKG is ordered today. The ekg ordered today demonstrates Sinus tachycardia, PVCs  Echo 05/21/21:  1. Left ventricular ejection fraction, by estimation, is 60 to 65%. The  left ventricle has normal function. The left ventricle has no regional  wall motion abnormalities. There is mild left ventricular hypertrophy.  Left ventricular diastolic parameters  are consistent with Grade  II diastolic dysfunction (pseudonormalization).  Elevated left atrial pressure.   2. Right ventricular systolic function is normal. The right ventricular  size is normal. Tricuspid regurgitation signal is inadequate for assessing  PA pressure.   3. The mitral valve is degenerative. Trivial mitral valve regurgitation.  Mild mitral stenosis. MG 25mHg at 72bpm, MVA 1.6 cm^2 by continuity  equation. Moderate mitral annular calcification.   4. The inferior vena cava is normal in size with greater than 50%  respiratory variability, suggesting right atrial pressure of 3 mmHg.   5. The aortic valve is calcified. There is severe calcifcation of the  aortic valve. Aortic valve regurgitation is not visualized. Severe aortic  valve stenosis. Vmax 3.9 m/s, MG 34 mmHg, AVA 0.6 cm^2, DI 0.19   FINDINGS   Left Ventricle: Left ventricular ejection fraction, by estimation, is 60  to 65%. The left ventricle has normal function. The left ventricle has no  regional wall motion abnormalities. 3D left ventricular ejection fraction  analysis performed but not  reported based on interpreter judgement due to suboptimal tracking. The  left ventricular internal cavity size was small. There is mild left  ventricular hypertrophy. Left ventricular diastolic parameters are  consistent with Grade II diastolic dysfunction  (pseudonormalization). Elevated left atrial pressure.   Right Ventricle: The right ventricular size is normal. No increase in  right ventricular wall thickness. Right ventricular systolic function is  normal.  Tricuspid regurgitation signal is inadequate for assessing PA  pressure.   Left Atrium: Left atrial size was normal in size.   Right Atrium: Right atrial size was normal in size.   Pericardium: Trivial pericardial effusion is present.   Mitral Valve: The mitral valve is degenerative in appearance. Moderate  mitral annular calcification. Trivial mitral valve regurgitation. Mild  mitral valve stenosis.   Tricuspid Valve: The tricuspid valve is normal in structure. Tricuspid  valve regurgitation is trivial.   Aortic Valve: The aortic valve is calcified. There is severe calcifcation  of the aortic valve. Aortic valve regurgitation is not visualized. Severe  aortic stenosis is present. Aortic valve mean gradient measures 33.7 mmHg.  Aortic valve peak gradient  measures 57.0 mmHg. Aortic valve area, by VTI measures 0.62 cm.   Pulmonic Valve: The pulmonic valve was not well visualized. Pulmonic valve  regurgitation is not visualized.   Aorta: The aortic root and ascending aorta are structurally normal, with  no evidence of dilitation.   Venous: The inferior vena cava is normal in size with greater than 50%  respiratory variability, suggesting right atrial pressure of 3 mmHg.   IAS/Shunts: The interatrial septum was not well visualized.      LEFT VENTRICLE  PLAX 2D  LVIDd:         3.70 cm   Diastology  LVIDs:         2.30 cm   LV e' medial:    4.28 cm/s  LV PW:         0.80 cm   LV E/e' medial:  23.4  LV IVS:        0.70 cm   LV e' lateral:   4.20 cm/s  LVOT diam:     2.00 cm   LV E/e' lateral: 23.8  LV SV:         58  LV SV Index:   43  LVOT Area:     3.14 cm  3D Volume EF:                           3D EF:        53 %                           LV EDV:       99 ml                           LV ESV:       46 ml                           LV SV:        53 ml   RIGHT VENTRICLE  RV S prime:     11.10 cm/s  TAPSE (M-mode): 1.8 cm   LEFT ATRIUM              Index        RIGHT ATRIUM           Index  LA diam:        2.00 cm 1.48 cm/m   RA Area:     11.30 cm  LA Vol (A2C):   37.0 ml 27.45 ml/m  RA Volume:   24.20 ml  17.95 ml/m  LA Vol (A4C):   29.7 ml 22.03 ml/m  LA Biplane Vol: 34.8 ml 25.81 ml/m   AORTIC VALVE  AV Area (Vmax):    0.63 cm  AV Area (Vmean):   0.59 cm  AV Area (VTI):     0.62 cm  AV Vmax:           377.33 cm/s  AV Vmean:          275.000 cm/s  AV VTI:            0.946 m  AV Peak Grad:      57.0 mmHg  AV Mean Grad:      33.7 mmHg  LVOT Vmax:         75.30 cm/s  LVOT Vmean:        51.400 cm/s  LVOT VTI:          0.186 m  LVOT/AV VTI ratio: 0.20     AORTA  Ao Root diam: 2.80 cm   MITRAL VALVE  MV Area (PHT): 3.37 cm     SHUNTS  MV Decel Time: 225 msec     Systemic VTI:  0.19 m  MV E velocity: 100.00 cm/s  Systemic Diam: 2.00 cm  MV A velocity: 154.00 cm/s  MV E/A ratio:  0.65   Recent Labs: 01/22/2021: ALT 10; BUN 24; Creatinine 1.18; Potassium 4.3; Sodium 141 03/26/2021: Hemoglobin 12.0; Platelet Count 175 04/02/2021: TSH 2.28   Lipid Panel    Component Value Date/Time   CHOL 199 04/02/2021 1116   TRIG 96 04/02/2021 1116   HDL 69 04/02/2021 1116   CHOLHDL 2.9 04/02/2021 1116   VLDL 17 04/08/2016 1059   LDLCALC 110 (H) 04/02/2021 1116     Wt Readings from Last 3 Encounters:  06/24/21 94 lb (42.6 kg)  04/04/21 93 lb (42.2 kg)  03/26/21 94 lb 4.8 oz (42.8 kg)     Other studies Reviewed: Additional studies/ records that were reviewed today include: office notes, echo images, EKG  Review of the above  records demonstrates: severe AS   Assessment and Plan:   1. Severe Aortic Valve Stenosis: She has severe, stage D aortic valve stenosis. I have personally reviewed the echo images. The aortic valve is thickened, calcified with limited leaflet mobility. I think she would benefit from AVR. Given advanced age, she is not a good candidate for conventional AVR by surgical approach. I think she may  be a good candidate for TAVR.   I have reviewed the natural history of aortic stenosis with the patient and their family members  who are present today. We have discussed the limitations of medical therapy and the poor prognosis associated with symptomatic aortic stenosis. We have reviewed potential treatment options, including palliative medical therapy, conventional surgical aortic valve replacement, and transcatheter aortic valve replacement. We discussed treatment options in the context of the patient's specific comorbid medical conditions.   She is not sure that she would like to proceed with TAVR at this time. She and her husband would like to talk this over before moving forward. If she decides to move forward, she will call back and we can arrange a right and left heart catheterization at [facility] and then her pre-TAVR CT scans and CT surgery visit.   Current medicines are reviewed at length with the patient today.  The patient does not have concerns regarding medicines.  The following changes have been made:  no change  Labs/ tests ordered today include:   Orders Placed This Encounter  Procedures   CBC   Basic metabolic panel   EKG 62-GBTD     Disposition:   F/U with the valve team.    Signed, Lauree Chandler, MD 06/24/2021 4:41 PM    [address], [place], [place]  [address] Phone: [phone]; Fax: [phone]

## 2021-06-24 NOTE — H&P (View-Only) (Signed)
Structural Heart Clinic Consult Note  Chief Complaint  Patient presents with   New Patient (Initial Visit)    Severe aortic stenosis   History of Present Illness: 88 yo female with history of prior etoh abuse, anemia, arthritis, COPD, former tobacco abuse (stopped smoking in 2011), hyperlipidemia, IBS and new finding of severe aortic stenosis who is here today in the structural heart clinic as a new consult, referred by Dr. Renold Genta, for further discussion regarding her aortic stenosis and possible TAVR. She has been followed for moderate aortic stenosis by Dr. Renold Genta. Echo 05/21/21 with LVEF=60-65%, mild LVH. Degenerative mitral valve with mild mitral stenosis, trivial MR, moderate MAC. The aortic valve leaflets are thickened and calcified with poor leaflet mobility. Severe aortic stenosis with mean gradient 33.7 mmHg, peak gradient 57 mmHg, AVA 0.62 cm2, DI 0.20.   She tells me today that she has baseline dyspnea and has had this from her COPD for years but over the past few months her dyspnea has progressed. She uses a walker at home. She is now very dyspneic with even minimal activity such as taking a bath. No chest pain, dizziness, near syncope, ankle edema. She lives with her husband in [place]. She is retired from office work. She has upper dentures and no active dental issues with her remaining teeth.    Primary Care Physician: Elby Showers, MD   Past Medical History:  Diagnosis Date   Alcoholic ([place])    36 years sober   Allergy    Anemia    Aortic stenosis    Arthritis    Asthma    patient denies   B12 deficiency    COPD (chronic obstructive pulmonary disease) ([place])    Depression    not recent   Diverticulosis    Esophageal stricture    Headache    cluster headaches occasionally   Hepatic cyst 01/30/2010   Hiatal hernia 1985   patient unaware   Hx of adenomatous colonic polyps 2006   Hyperlipidemia    IBS (irritable bowel syndrome)    PONV (postoperative nausea  and vomiting)    no nausea or vomitting after surgery 07/2014   Positive PPD    PUD (peptic ulcer disease)    Shortness of breath dyspnea    SOB WITH EXERTION   (NOT NEW)    Past Surgical History:  Procedure Laterality Date   ABDOMINAL HYSTERECTOMY     abnormal bleeding   APPENDECTOMY     BARTHOLIN GLAND CYST EXCISION     x2   BREAST BIOPSY Bilateral    Milk glnd   CATARACT EXTRACTION W/ INTRAOCULAR LENS  IMPLANT, BILATERAL Bilateral    COLONOSCOPY     COLONOSCOPY W/ POLYPECTOMY     HARDWARE REMOVAL N/A 12/13/2015   Procedure: Removal of HARDWARE  Lumbar Five-Sacral One ;  Surgeon: Kary Kos, MD;  Location: [facility];  Service: Neurosurgery;  Laterality: N/A;   IR KYPHO LUMBAR INC FX REDUCE BONE BX UNI/BIL CANNULATION INC/IMAGING  07/13/2019   UPPER GI ENDOSCOPY     VAGOTOMY AND PYLOROPLASTY     in [place], [place]    Current Outpatient Medications  Medication Sig Dispense Refill   albuterol (VENTOLIN HFA) 108 (90 Base) MCG/ACT inhaler INHALE 2 PUFFS INTO THE LUNGS 4 (FOUR) TIMES DAILY AS NEEDED FOR WHEEZING OR SHORTNESS OF BREATH. 36 each 2   ALPRAZolam (XANAX) 0.25 MG tablet TAKE ONE TAB UP TO TWICE DAILY AS NEEDED FOR ANXIETY AND SLEEP 60 tablet  5   escitalopram (LEXAPRO) 5 MG tablet TAKE 1 TABLET BY MOUTH EVERY DAY 90 tablet 2   ipratropium (ATROVENT) 0.02 % nebulizer solution TAKE 2.5 MLS (0.5 MG TOTAL) BY NEBULIZATION 4 (FOUR) TIMES DAILY. 62.5 mL 12   Polysaccharide Iron Complex (POLYSACCHARIDE IRON PO) Take 1 capsule by mouth daily.     traMADol (ULTRAM) 50 MG tablet Take 50 mg by mouth every 6 (six) hours as needed for moderate pain.      traZODone (DESYREL) 50 MG tablet TAKE 3 TABLETS BY MOUTH AT BEDTIME 270 tablet 0   ferrous sulfate 325 (65 FE) MG EC tablet Take 1 tablet by mouth once every other day with a glass of orange juice. (Patient not taking: Reported on 06/24/2021) 30 tablet 3   No current facility-administered medications for this visit.    Allergies   Allergen Reactions   Aspirin Other (See Comments)    Hx of ulcers, stomach issues     Social History   Socioeconomic History   Marital status: Married    Spouse name: Not on file   Number of children: 2   Years of education: Not on file   Highest education level: Not on file  Occupational History    Employer: RETIRED   Occupation: Retired-office work  Tobacco Use   Smoking status: Former    Years: 60.00    Types: Cigarettes    Quit date: 02/02/2010    Years since quitting: 11.3   Smokeless tobacco: Never  Substance and Sexual Activity   Alcohol use: No    Comment: recovering alcoholic     40 YRS   Drug use: No   Sexual activity: Not on file  Other Topics Concern   Not on file  Social History Narrative   Daily caffeine    Social Determinants of Health   Financial Resource Strain: Not on file  Food Insecurity: Not on file  Transportation Needs: Not on file  Physical Activity: Not on file  Stress: Not on file  Social Connections: Not on file  Intimate Partner Violence: Not on file    Family History  Problem Relation Age of Onset   Ulcers Father    Stroke Father    Irritable bowel syndrome Father    Ovarian cancer Sister    Colon cancer Neg Hx     Review of Systems:  As stated in the HPI and otherwise negative.   BP 112/70    Pulse (!) 102    Ht 5' (1.524 m)    Wt 94 lb (42.6 kg)    SpO2 95%    BMI 18.36 kg/m   Physical Examination: General: Thin female in NAD.  HEENT: OP clear, mucus membranes moist  SKIN: warm, dry. No rashes. Neuro: No focal deficits  Musculoskeletal: Muscle strength 5/5 all ext  Psychiatric: Mood and affect normal  Neck: No JVD, no carotid bruits, no thyromegaly, no lymphadenopathy.  Lungs:Clear bilaterally, no wheezes, rhonci, crackles Cardiovascular: Regular rate and rhythm. Harsh systolic murmur.  Abdomen:Soft. Bowel sounds present. Non-tender.  Extremities: No lower extremity edema. Pulses are 2 + in the bilateral  DP/PT.  EKG:  EKG is ordered today. The ekg ordered today demonstrates Sinus tachycardia, PVCs  Echo 05/21/21:  1. Left ventricular ejection fraction, by estimation, is 60 to 65%. The  left ventricle has normal function. The left ventricle has no regional  wall motion abnormalities. There is mild left ventricular hypertrophy.  Left ventricular diastolic parameters  are consistent with Grade  II diastolic dysfunction (pseudonormalization).  Elevated left atrial pressure.   2. Right ventricular systolic function is normal. The right ventricular  size is normal. Tricuspid regurgitation signal is inadequate for assessing  PA pressure.   3. The mitral valve is degenerative. Trivial mitral valve regurgitation.  Mild mitral stenosis. MG 25mHg at 72bpm, MVA 1.6 cm^2 by continuity  equation. Moderate mitral annular calcification.   4. The inferior vena cava is normal in size with greater than 50%  respiratory variability, suggesting right atrial pressure of 3 mmHg.   5. The aortic valve is calcified. There is severe calcifcation of the  aortic valve. Aortic valve regurgitation is not visualized. Severe aortic  valve stenosis. Vmax 3.9 m/s, MG 34 mmHg, AVA 0.6 cm^2, DI 0.19   FINDINGS   Left Ventricle: Left ventricular ejection fraction, by estimation, is 60  to 65%. The left ventricle has normal function. The left ventricle has no  regional wall motion abnormalities. 3D left ventricular ejection fraction  analysis performed but not  reported based on interpreter judgement due to suboptimal tracking. The  left ventricular internal cavity size was small. There is mild left  ventricular hypertrophy. Left ventricular diastolic parameters are  consistent with Grade II diastolic dysfunction  (pseudonormalization). Elevated left atrial pressure.   Right Ventricle: The right ventricular size is normal. No increase in  right ventricular wall thickness. Right ventricular systolic function is  normal.  Tricuspid regurgitation signal is inadequate for assessing PA  pressure.   Left Atrium: Left atrial size was normal in size.   Right Atrium: Right atrial size was normal in size.   Pericardium: Trivial pericardial effusion is present.   Mitral Valve: The mitral valve is degenerative in appearance. Moderate  mitral annular calcification. Trivial mitral valve regurgitation. Mild  mitral valve stenosis.   Tricuspid Valve: The tricuspid valve is normal in structure. Tricuspid  valve regurgitation is trivial.   Aortic Valve: The aortic valve is calcified. There is severe calcifcation  of the aortic valve. Aortic valve regurgitation is not visualized. Severe  aortic stenosis is present. Aortic valve mean gradient measures 33.7 mmHg.  Aortic valve peak gradient  measures 57.0 mmHg. Aortic valve area, by VTI measures 0.62 cm.   Pulmonic Valve: The pulmonic valve was not well visualized. Pulmonic valve  regurgitation is not visualized.   Aorta: The aortic root and ascending aorta are structurally normal, with  no evidence of dilitation.   Venous: The inferior vena cava is normal in size with greater than 50%  respiratory variability, suggesting right atrial pressure of 3 mmHg.   IAS/Shunts: The interatrial septum was not well visualized.      LEFT VENTRICLE  PLAX 2D  LVIDd:         3.70 cm   Diastology  LVIDs:         2.30 cm   LV e' medial:    4.28 cm/s  LV PW:         0.80 cm   LV E/e' medial:  23.4  LV IVS:        0.70 cm   LV e' lateral:   4.20 cm/s  LVOT diam:     2.00 cm   LV E/e' lateral: 23.8  LV SV:         58  LV SV Index:   43  LVOT Area:     3.14 cm  3D Volume EF:                           3D EF:        53 %                           LV EDV:       99 ml                           LV ESV:       46 ml                           LV SV:        53 ml   RIGHT VENTRICLE  RV S prime:     11.10 cm/s  TAPSE (M-mode): 1.8 cm   LEFT ATRIUM              Index        RIGHT ATRIUM           Index  LA diam:        2.00 cm 1.48 cm/m   RA Area:     11.30 cm  LA Vol (A2C):   37.0 ml 27.45 ml/m  RA Volume:   24.20 ml  17.95 ml/m  LA Vol (A4C):   29.7 ml 22.03 ml/m  LA Biplane Vol: 34.8 ml 25.81 ml/m   AORTIC VALVE  AV Area (Vmax):    0.63 cm  AV Area (Vmean):   0.59 cm  AV Area (VTI):     0.62 cm  AV Vmax:           377.33 cm/s  AV Vmean:          275.000 cm/s  AV VTI:            0.946 m  AV Peak Grad:      57.0 mmHg  AV Mean Grad:      33.7 mmHg  LVOT Vmax:         75.30 cm/s  LVOT Vmean:        51.400 cm/s  LVOT VTI:          0.186 m  LVOT/AV VTI ratio: 0.20     AORTA  Ao Root diam: 2.80 cm   MITRAL VALVE  MV Area (PHT): 3.37 cm     SHUNTS  MV Decel Time: 225 msec     Systemic VTI:  0.19 m  MV E velocity: 100.00 cm/s  Systemic Diam: 2.00 cm  MV A velocity: 154.00 cm/s  MV E/A ratio:  0.65   Recent Labs: 01/22/2021: ALT 10; BUN 24; Creatinine 1.18; Potassium 4.3; Sodium 141 03/26/2021: Hemoglobin 12.0; Platelet Count 175 04/02/2021: TSH 2.28   Lipid Panel    Component Value Date/Time   CHOL 199 04/02/2021 1116   TRIG 96 04/02/2021 1116   HDL 69 04/02/2021 1116   CHOLHDL 2.9 04/02/2021 1116   VLDL 17 04/08/2016 1059   LDLCALC 110 (H) 04/02/2021 1116     Wt Readings from Last 3 Encounters:  06/24/21 94 lb (42.6 kg)  04/04/21 93 lb (42.2 kg)  03/26/21 94 lb 4.8 oz (42.8 kg)     Other studies Reviewed: Additional studies/ records that were reviewed today include: office notes, echo images, EKG  Review of the above  records demonstrates: severe AS   Assessment and Plan:   1. Severe Aortic Valve Stenosis: She has severe, stage D aortic valve stenosis. I have personally reviewed the echo images. The aortic valve is thickened, calcified with limited leaflet mobility. I think she would benefit from AVR. Given advanced age, she is not a good candidate for conventional AVR by surgical approach. I think she may  be a good candidate for TAVR.   I have reviewed the natural history of aortic stenosis with the patient and their family members  who are present today. We have discussed the limitations of medical therapy and the poor prognosis associated with symptomatic aortic stenosis. We have reviewed potential treatment options, including palliative medical therapy, conventional surgical aortic valve replacement, and transcatheter aortic valve replacement. We discussed treatment options in the context of the patient's specific comorbid medical conditions.   She is not sure that she would like to proceed with TAVR at this time. She and her husband would like to talk this over before moving forward. If she decides to move forward, she will call back and we can arrange a right and left heart catheterization at [facility] and then her pre-TAVR CT scans and CT surgery visit.   Current medicines are reviewed at length with the patient today.  The patient does not have concerns regarding medicines.  The following changes have been made:  no change  Labs/ tests ordered today include:   Orders Placed This Encounter  Procedures   CBC   Basic metabolic panel   EKG 62-GBTD     Disposition:   F/U with the valve team.    Signed, Lauree Chandler, MD 06/24/2021 4:41 PM    [address], [place], [place]  [address] Phone: [phone]; Fax: [phone]

## 2021-06-24 NOTE — Telephone Encounter (Signed)
Katherine [name] [phone]  Katherine [name] stopped by today and said they would like to get your advise on how they should move forward with what they learned from Dr Lauree Chandler today at [facility] office visit. They were totally surprised that she needed anything.

## 2021-06-24 NOTE — Patient Instructions (Signed)
Medication Instructions:  No changes today *If you need a refill on your cardiac medications before your next appointment, please call your pharmacy*   Lab Work: Today: BMET, CBC   Testing/Procedures: none   Follow-Up: Please call if you are interested in pursing further valve surgery work up.   Other Instructions

## 2021-06-25 LAB — BASIC METABOLIC PANEL
BUN/Creatinine Ratio: 25 (ref 12–28)
BUN: 27 mg/dL (ref 10–36)
CO2: 22 mmol/L (ref 20–29)
Calcium: 9.7 mg/dL (ref 8.7–10.3)
Chloride: 107 mmol/L — ABNORMAL HIGH (ref 96–106)
Creatinine, Ser: 1.06 mg/dL — ABNORMAL HIGH (ref 0.57–1.00)
Glucose: 132 mg/dL — ABNORMAL HIGH (ref 70–99)
Potassium: 4.2 mmol/L (ref 3.5–5.2)
Sodium: 144 mmol/L (ref 134–144)
eGFR: 50 mL/min/{1.73_m2} — ABNORMAL LOW (ref >59)

## 2021-06-25 LAB — CBC
Hematocrit: 38.1 % (ref 34.0–46.6)
Hemoglobin: 12.4 g/dL (ref 11.1–15.9)
MCH: 29.9 pg (ref 26.6–33.0)
MCHC: 32.5 g/dL (ref 31.5–35.7)
MCV: 92 fL (ref 79–97)
Platelets: 225 10*3/uL (ref 150–450)
RBC: 4.15 x10E6/uL (ref 3.77–5.28)
RDW: 12.8 % (ref 11.7–15.4)
WBC: 8.4 10*3/uL (ref 3.4–10.8)

## 2021-06-25 NOTE — Progress Notes (Addendum)
Pre Surgical Assessment: 5 M Walk Test  59M=16.18ft  5 Meter Walk Test- trial 1: 8.70 seconds 5 Meter Walk Test- trial 2: 9.65 seconds 5 Meter Walk Test- trial 3: 7.862 seconds 5 Meter Walk Test Average: 8.73 seconds  ________________________  Procedure: Isolated AVR Risk of Mortality: 8.246% Renal Failure: 7.455% Permanent Stroke: 1.321% Prolonged Ventilation: 16.858% DSW Infection: 0.195% Reoperation: 2.977% Morbidity or Mortality: 21.095% Short Length of Stay: 8.642% Long Length of Stay: 15.472%

## 2021-06-27 ENCOUNTER — Telehealth: Payer: Self-pay | Admitting: *Deleted

## 2021-06-27 NOTE — Telephone Encounter (Signed)
Called and spoke w patient's husband.  The patient wishes to proceed with TAVR workup starting w right and left heart catheterization.  We have arranged for Jan 26 at 10:30 am with Dr. Angelena Form.  We reviewed instructions and they will be sent through [facility] as well for their review.

## 2021-06-27 NOTE — Telephone Encounter (Signed)
-----   Message from Eileen Stanford, PA-C sent at 06/27/2021  1:10 PM EST ----- Regarding: set up L/RHC This pt wants to proceed with [facility] for pre TAVR assessment. Do you mind helping me set that up Lorian Yaun? They got labs in the office this week and were consented.   Thanks! KT

## 2021-06-28 NOTE — Addendum Note (Signed)
Addended by: Lauree Chandler D on: 06/28/2021 10:36 AM   Modules accepted: Orders

## 2021-07-09 DIAGNOSIS — M961 Postlaminectomy syndrome, not elsewhere classified: Secondary | ICD-10-CM | POA: Diagnosis not present

## 2021-07-09 DIAGNOSIS — M47818 Spondylosis without myelopathy or radiculopathy, sacral and sacrococcygeal region: Secondary | ICD-10-CM | POA: Diagnosis not present

## 2021-07-09 DIAGNOSIS — M47816 Spondylosis without myelopathy or radiculopathy, lumbar region: Secondary | ICD-10-CM | POA: Diagnosis not present

## 2021-07-09 DIAGNOSIS — M4316 Spondylolisthesis, lumbar region: Secondary | ICD-10-CM | POA: Diagnosis not present

## 2021-07-10 ENCOUNTER — Other Ambulatory Visit: Payer: Self-pay | Admitting: Emergency Medicine

## 2021-07-10 ENCOUNTER — Telehealth: Payer: Self-pay | Admitting: *Deleted

## 2021-07-10 ENCOUNTER — Encounter: Payer: Self-pay | Admitting: Cardiology

## 2021-07-10 ENCOUNTER — Other Ambulatory Visit: Payer: Self-pay | Admitting: Cardiology

## 2021-07-10 DIAGNOSIS — I350 Nonrheumatic aortic (valve) stenosis: Secondary | ICD-10-CM

## 2021-07-10 NOTE — Telephone Encounter (Signed)
Cardiac catheterization scheduled at [facility] for: Thursday July 11, 2021 10:30 AM [facility] Main Entrance A [facility]) at: 8:30 AM   Diet-no solid food after midnight prior to cath, clear liquids until 5 AM day of procedure.   Medication instructions for procedure: -Usual morning medications can be taken pre-cath with sips of water.    Confirmed patient has responsible adult to drive home post procedure and be with patient first 24 hours after arriving home.  [facility] does allow one visitor to accompany you and wait in the hospital waiting room while you are there for your procedure. You and your visitor will be asked to wear a mask once you enter the hospital.   Patient reports does not currently have any new symptoms concerning for COVID-19 and no household members with COVID-19 like illness.        Reviewed procedure/mask/visitor instructions with patient.

## 2021-07-11 ENCOUNTER — Encounter (HOSPITAL_COMMUNITY): Payer: Self-pay | Admitting: Cardiovascular Disease

## 2021-07-11 ENCOUNTER — Other Ambulatory Visit: Payer: Self-pay

## 2021-07-11 ENCOUNTER — Ambulatory Visit (HOSPITAL_COMMUNITY)
Admission: RE | Admit: 2021-07-11 | Discharge: 2021-07-11 | Disposition: A | Discharge status: Home / Self Care | Payer: Medicare PPO | Attending: Cardiovascular Disease | Admitting: Cardiovascular Disease

## 2021-07-11 ENCOUNTER — Encounter (HOSPITAL_COMMUNITY)
Admission: RE | Disposition: A | Discharge status: Home / Self Care | Payer: Self-pay | Source: Home / Self Care | Attending: Cardiovascular Disease

## 2021-07-11 DIAGNOSIS — J449 Chronic obstructive pulmonary disease, unspecified: Secondary | ICD-10-CM | POA: Diagnosis not present

## 2021-07-11 DIAGNOSIS — E785 Hyperlipidemia, unspecified: Secondary | ICD-10-CM | POA: Diagnosis not present

## 2021-07-11 DIAGNOSIS — I2510 Atherosclerotic heart disease of native coronary artery without angina pectoris: Secondary | ICD-10-CM | POA: Insufficient documentation

## 2021-07-11 DIAGNOSIS — Z87891 Personal history of nicotine dependence: Secondary | ICD-10-CM | POA: Insufficient documentation

## 2021-07-11 DIAGNOSIS — I350 Nonrheumatic aortic (valve) stenosis: Secondary | ICD-10-CM | POA: Diagnosis not present

## 2021-07-11 DIAGNOSIS — N289 Disorder of kidney and ureter, unspecified: Secondary | ICD-10-CM

## 2021-07-11 HISTORY — PX: RIGHT/LEFT HEART CATH AND CORONARY ANGIOGRAPHY: CATH118266

## 2021-07-11 LAB — POCT I-STAT EG7
Acid-base deficit: 4 mmol/L — ABNORMAL HIGH (ref 0.0–2.0)
Bicarbonate: 21.7 mmol/L (ref 20.0–28.0)
Calcium, Ion: 0.86 mmol/L — CL (ref 1.15–1.40)
HCT: 26 % — ABNORMAL LOW (ref 36.0–46.0)
Hemoglobin: 8.8 g/dL — ABNORMAL LOW (ref 12.0–15.0)
O2 Saturation: 76 %
Potassium: 2.6 mmol/L — CL (ref 3.5–5.1)
Sodium: 149 mmol/L — ABNORMAL HIGH (ref 135–145)
TCO2: 23 mmol/L (ref 22–32)
pCO2, Ven: 42.6 mmHg — ABNORMAL LOW (ref 44.0–60.0)
pH, Ven: 7.316 (ref 7.250–7.430)
pO2, Ven: 45 mmHg (ref 32.0–45.0)

## 2021-07-11 LAB — POCT I-STAT 7, (LYTES, BLD GAS, ICA,H+H)
Acid-base deficit: 1 mmol/L (ref 0.0–2.0)
Bicarbonate: 25.7 mmol/L (ref 20.0–28.0)
Calcium, Ion: 1.21 mmol/L (ref 1.15–1.40)
HCT: 32 % — ABNORMAL LOW (ref 36.0–46.0)
Hemoglobin: 10.9 g/dL — ABNORMAL LOW (ref 12.0–15.0)
O2 Saturation: 100 %
Potassium: 3.4 mmol/L — ABNORMAL LOW (ref 3.5–5.1)
Sodium: 141 mmol/L (ref 135–145)
TCO2: 27 mmol/L (ref 22–32)
pCO2 arterial: 48.6 mmHg — ABNORMAL HIGH (ref 32.0–48.0)
pH, Arterial: 7.331 — ABNORMAL LOW (ref 7.350–7.450)
pO2, Arterial: 189 mmHg — ABNORMAL HIGH (ref 83.0–108.0)

## 2021-07-11 SURGERY — RIGHT/LEFT HEART CATH AND CORONARY ANGIOGRAPHY
Anesthesia: LOCAL

## 2021-07-11 MED ORDER — MIDAZOLAM HCL 2 MG/2ML IJ SOLN
INTRAMUSCULAR | Status: AC
  Filled 2021-07-11: qty 2

## 2021-07-11 MED ORDER — SODIUM CHLORIDE 0.9 % IV SOLN: 250.0000 mL | INTRAVENOUS | Status: DC | PRN

## 2021-07-11 MED ORDER — ASPIRIN 81 MG PO CHEW
81.0000 mg | CHEWABLE_TABLET | Freq: Once | ORAL | Status: AC
  Administered 2021-07-11: 81 mg via ORAL

## 2021-07-11 MED ORDER — LIDOCAINE HCL (PF) 1 % IJ SOLN
INTRAMUSCULAR | Status: AC
  Filled 2021-07-11: qty 30

## 2021-07-11 MED ORDER — ONDANSETRON HCL 4 MG/2ML IJ SOLN: 4.0000 mg | Freq: Four times a day (QID) | INTRAMUSCULAR | Status: DC | PRN

## 2021-07-11 MED ORDER — LABETALOL HCL 5 MG/ML IV SOLN: 10.0000 mg | INTRAVENOUS | Status: DC | PRN

## 2021-07-11 MED ORDER — SODIUM CHLORIDE 0.9 % WEIGHT BASED INFUSION: 1.0000 mL/kg/h | INTRAVENOUS | Status: DC

## 2021-07-11 MED ORDER — FENTANYL CITRATE (PF) 100 MCG/2ML IJ SOLN
INTRAMUSCULAR | Status: DC | PRN
  Administered 2021-07-11: 25 ug via INTRAVENOUS

## 2021-07-11 MED ORDER — SODIUM CHLORIDE 0.9 % WEIGHT BASED INFUSION
3.0000 mL/kg/h | INTRAVENOUS | Status: AC
  Administered 2021-07-11: 3 mL/kg/h via INTRAVENOUS

## 2021-07-11 MED ORDER — SODIUM CHLORIDE 0.9% FLUSH: 3.0000 mL | Freq: Two times a day (BID) | INTRAVENOUS | Status: DC

## 2021-07-11 MED ORDER — HEPARIN (PORCINE) IN NACL 1000-0.9 UT/500ML-% IV SOLN
INTRAVENOUS | Status: AC
  Filled 2021-07-11: qty 1000

## 2021-07-11 MED ORDER — SODIUM CHLORIDE 0.9 % IV SOLN: INTRAVENOUS | Status: AC

## 2021-07-11 MED ORDER — ACETAMINOPHEN 325 MG PO TABS: 650.0000 mg | ORAL_TABLET | ORAL | Status: DC | PRN

## 2021-07-11 MED ORDER — HYDRALAZINE HCL 20 MG/ML IJ SOLN: 10.0000 mg | INTRAMUSCULAR | Status: DC | PRN

## 2021-07-11 MED ORDER — HEPARIN SODIUM (PORCINE) 1000 UNIT/ML IJ SOLN
INTRAMUSCULAR | Status: AC
  Filled 2021-07-11: qty 10

## 2021-07-11 MED ORDER — MIDAZOLAM HCL 2 MG/2ML IJ SOLN
INTRAMUSCULAR | Status: DC | PRN
  Administered 2021-07-11: 1 mg via INTRAVENOUS

## 2021-07-11 MED ORDER — IOHEXOL 350 MG/ML SOLN
INTRAVENOUS | Status: DC | PRN
  Administered 2021-07-11: 50 mL

## 2021-07-11 MED ORDER — SODIUM CHLORIDE 0.9% FLUSH: 3.0000 mL | INTRAVENOUS | Status: DC | PRN

## 2021-07-11 MED ORDER — ASPIRIN 81 MG PO CHEW
CHEWABLE_TABLET | ORAL | Status: AC
  Filled 2021-07-11: qty 1

## 2021-07-11 MED ORDER — FENTANYL CITRATE (PF) 100 MCG/2ML IJ SOLN
INTRAMUSCULAR | Status: AC
  Filled 2021-07-11: qty 2

## 2021-07-11 MED ORDER — VERAPAMIL HCL 2.5 MG/ML IV SOLN
INTRAVENOUS | Status: AC
  Filled 2021-07-11: qty 2

## 2021-07-11 MED ORDER — HEPARIN (PORCINE) IN NACL 1000-0.9 UT/500ML-% IV SOLN
INTRAVENOUS | Status: DC | PRN
  Administered 2021-07-11 (×2): 500 mL

## 2021-07-11 MED ORDER — LIDOCAINE HCL (PF) 1 % IJ SOLN
INTRAMUSCULAR | Status: DC | PRN
  Administered 2021-07-11: 20 mL
  Administered 2021-07-11: 2 mL

## 2021-07-11 SURGICAL SUPPLY — 16 items
CATH BALLN WEDGE 5F 110CM (CATHETERS) ×1 IMPLANT
CATH INFINITI 5FR MULTPACK ANG (CATHETERS) ×1 IMPLANT
CLOSURE MYNX CONTROL 5F (Vascular Products) ×1 IMPLANT
GLIDESHEATH SLEND SS 6F .021 (SHEATH) IMPLANT
GUIDEWIRE INQWIRE 1.5J.035X260 (WIRE) IMPLANT
INQWIRE 1.5J .035X260CM (WIRE) ×2
KIT ENCORE 26 ADVANTAGE (KITS) IMPLANT
KIT HEART LEFT (KITS) ×2 IMPLANT
KIT MICROPUNCTURE NIT STIFF (SHEATH) ×1 IMPLANT
PACK CARDIAC CATHETERIZATION (CUSTOM PROCEDURE TRAY) ×2 IMPLANT
SHEATH GLIDE SLENDER 4/5FR (SHEATH) ×1 IMPLANT
SHEATH PINNACLE 5F 10CM (SHEATH) ×1 IMPLANT
SHEATH PROBE COVER 6X72 (BAG) ×1 IMPLANT
SYR MEDRAD MARK 7 150ML (SYRINGE) ×2 IMPLANT
TRANSDUCER W/STOPCOCK (MISCELLANEOUS) ×2 IMPLANT
TUBING CIL FLEX 10 FLL-RA (TUBING) ×2 IMPLANT

## 2021-07-11 NOTE — Interval H&P Note (Signed)
History and Physical Interval Note:  07/11/2021 9:00 AM  Katherine [name]  has presented today for surgery, with the diagnosis of severe aortic stenosis.  The various methods of treatment have been discussed with the patient and family. After consideration of risks, benefits and other options for treatment, the patient has consented to  Procedure(s): RIGHT/LEFT HEART CATH AND CORONARY ANGIOGRAPHY (N/A) as a surgical intervention.  The patient's history has been reviewed, patient examined, no change in status, stable for surgery.  I have reviewed the patient's chart and labs.  Questions were answered to the patient's satisfaction.    Cath Lab Visit (complete for each Cath Lab visit)  Clinical Evaluation Leading to the Procedure:   ACS: No.  Non-ACS:    Anginal Classification: CCS II  Anti-ischemic medical therapy: No Therapy  Non-Invasive Test Results: No non-invasive testing performed  Prior CABG: No previous CABG        Lauree Chandler

## 2021-07-19 ENCOUNTER — Other Ambulatory Visit: Payer: Self-pay

## 2021-07-19 ENCOUNTER — Encounter (HOSPITAL_COMMUNITY): Payer: Self-pay

## 2021-07-19 ENCOUNTER — Emergency Department (HOSPITAL_COMMUNITY)
Admission: EM | Admit: 2021-07-19 | Discharge: 2021-07-19 | Disposition: A | Discharge status: Home / Self Care | Payer: Medicare PPO | Attending: Emergency Medicine | Admitting: Emergency Medicine

## 2021-07-19 ENCOUNTER — Emergency Department (HOSPITAL_COMMUNITY): Payer: Medicare PPO

## 2021-07-19 DIAGNOSIS — Z79899 Other long term (current) drug therapy: Secondary | ICD-10-CM | POA: Diagnosis not present

## 2021-07-19 DIAGNOSIS — K209 Esophagitis, unspecified without bleeding: Secondary | ICD-10-CM | POA: Insufficient documentation

## 2021-07-19 DIAGNOSIS — R070 Pain in throat: Secondary | ICD-10-CM | POA: Diagnosis present

## 2021-07-19 DIAGNOSIS — J449 Chronic obstructive pulmonary disease, unspecified: Secondary | ICD-10-CM | POA: Diagnosis not present

## 2021-07-19 DIAGNOSIS — I6523 Occlusion and stenosis of bilateral carotid arteries: Secondary | ICD-10-CM | POA: Diagnosis not present

## 2021-07-19 DIAGNOSIS — E041 Nontoxic single thyroid nodule: Secondary | ICD-10-CM | POA: Diagnosis not present

## 2021-07-19 DIAGNOSIS — M47812 Spondylosis without myelopathy or radiculopathy, cervical region: Secondary | ICD-10-CM | POA: Diagnosis not present

## 2021-07-19 DIAGNOSIS — M4312 Spondylolisthesis, cervical region: Secondary | ICD-10-CM | POA: Diagnosis not present

## 2021-07-19 LAB — CBC WITH DIFFERENTIAL/PLATELET
Abs Immature Granulocytes: 0.02 10*3/uL (ref 0.00–0.07)
Basophils Absolute: 0.1 10*3/uL (ref 0.0–0.1)
Basophils Relative: 1 %
Eosinophils Absolute: 0.1 10*3/uL (ref 0.0–0.5)
Eosinophils Relative: 1 %
HCT: 38.5 % (ref 36.0–46.0)
Hemoglobin: 12.3 g/dL (ref 12.0–15.0)
Immature Granulocytes: 0 %
Lymphocytes Relative: 12 %
Lymphs Abs: 0.7 10*3/uL (ref 0.7–4.0)
MCH: 29.7 pg (ref 26.0–34.0)
MCHC: 31.9 g/dL (ref 30.0–36.0)
MCV: 93 fL (ref 80.0–100.0)
Monocytes Absolute: 0.3 10*3/uL (ref 0.1–1.0)
Monocytes Relative: 5 %
Neutro Abs: 5 10*3/uL (ref 1.7–7.7)
Neutrophils Relative %: 81 %
Platelets: 168 10*3/uL (ref 150–400)
RBC: 4.14 MIL/uL (ref 3.87–5.11)
RDW: 12.5 % (ref 11.5–15.5)
WBC: 6.2 10*3/uL (ref 4.0–10.5)
nRBC: 0 % (ref 0.0–0.2)

## 2021-07-19 LAB — BASIC METABOLIC PANEL
Anion gap: 9 (ref 5–15)
BUN: 20 mg/dL (ref 8–23)
CO2: 26 mmol/L (ref 22–32)
Calcium: 9.2 mg/dL (ref 8.9–10.3)
Chloride: 106 mmol/L (ref 98–111)
Creatinine, Ser: 0.87 mg/dL (ref 0.44–1.00)
GFR, Estimated: >60 mL/min (ref >60)
Glucose, Bld: 97 mg/dL (ref 70–99)
Potassium: 3.7 mmol/L (ref 3.5–5.1)
Sodium: 141 mmol/L (ref 135–145)

## 2021-07-19 MED ORDER — SUCRALFATE 1 GM/10ML PO SUSP: 1.0000 g | Freq: Three times a day (TID) | ORAL | 0 refills | Status: DC

## 2021-07-19 NOTE — Discharge Instructions (Signed)
Use the Carafate for the next few days.  Eat soft foods only for the next few days.  If your symptoms have not improved by Monday, you should contact your primary care doctor or gastroenterologist to arrange follow-up.  At that point, you may need an endoscopy to better look at your esophagus.  If you have any worsening symptoms such as difficulty swallowing, shortness of breath, vomiting or other worsening symptoms, return to the emergency room.

## 2021-07-19 NOTE — ED Provider Notes (Addendum)
[facility] DEPT Provider Note   CSN: [id number] Arrival date & time: 07/19/21  8756     History  No chief complaint on file.   Katherine [name] is a 88 y.o. female.  Patient is a 88 year old female who presents with a possible esophageal foreign body.  Per chart review, she has a history of COPD, IBS, peptic ulcer disease, hyperlipidemia and aortic stenosis.  She says last night she was eating some boneless chicken breast and felt like a piece got stuck in her throat.  She was not able to eat anything after that.  This morning she got up and tried to drink some orange juice and it came right back up.  She is able to swallow her saliva.  She still feels like there is something stuck in the middle of her throat.  She denies any shortness of breath other than her baseline COPD.  No abdominal pain.  She has some nausea but no vomiting.  Other than when she spit up the orange juice.  History is obtained from the patient and her husband. .     Home Medications Prior to Admission medications   Medication Sig Start Date End Date Taking? Authorizing Provider  sucralfate (CARAFATE) 1 GM/10ML suspension Take 10 mLs (1 g total) by mouth 4 (four) times daily -  with meals and at bedtime. 07/19/21  Yes Malvin Johns, MD  albuterol (VENTOLIN HFA) 108 (90 Base) MCG/ACT inhaler INHALE 2 PUFFS INTO THE LUNGS 4 (FOUR) TIMES DAILY AS NEEDED FOR WHEEZING OR SHORTNESS OF BREATH. 07/10/21   Byrum, Rose Fillers, MD  ALPRAZolam (XANAX) 0.25 MG tablet TAKE ONE TAB UP TO TWICE DAILY AS NEEDED FOR ANXIETY AND SLEEP Patient taking differently: Take 0.25 mg by mouth at bedtime. 06/03/21   Elby Showers, MD  budesonide (PULMICORT) 0.5 MG/2ML nebulizer solution Take 0.5 mg by nebulization daily as needed (shortness of breath).    [provider]  Cyanocobalamin (VITAMIN B-12 PO) Take 1 tablet by mouth daily.    [provider]  escitalopram (LEXAPRO) 5 MG tablet TAKE 1 TABLET  BY MOUTH EVERY DAY 10/21/20   Elby Showers, MD  ferrous sulfate 325 (65 FE) MG EC tablet Take 1 tablet by mouth once every other day with a glass of orange juice. 01/22/21   Dede Query T, PA-C  ipratropium (ATROVENT) 0.02 % nebulizer solution TAKE 2.5 MLS (0.5 MG TOTAL) BY NEBULIZATION 4 (FOUR) TIMES DAILY. 02/09/21   Elby Showers, MD  Melatonin 10 MG CAPS Take 10 mg by mouth daily.    [provider]  Multiple Vitamins-Minerals (MULTIVITAMIN WITH MINERALS) tablet Take 1 tablet by mouth daily.    [provider]  Polysaccharide Iron Complex (POLYSACCHARIDE IRON PO) Take 1 capsule by mouth daily.    [provider]  traMADol (ULTRAM) 50 MG tablet Take 50 mg by mouth every 6 (six) hours as needed for moderate pain.  07/18/19   [provider]  traZODone (DESYREL) 50 MG tablet TAKE 3 TABLETS BY MOUTH AT BEDTIME Patient taking differently: Take 100 mg by mouth at bedtime. 06/21/21   Elby Showers, MD      Allergies    Aspirin    Review of Systems   Review of Systems  Constitutional:  Negative for chills, diaphoresis, fatigue and fever.  HENT:  Positive for sore throat. Negative for congestion, rhinorrhea and sneezing.   Eyes: Negative.   Respiratory:  Negative for cough, chest tightness  and shortness of breath.   Cardiovascular:  Negative for chest pain and leg swelling.  Gastrointestinal:  Positive for nausea. Negative for abdominal pain, blood in stool, diarrhea and vomiting.  Genitourinary:  Negative for difficulty urinating, flank pain, frequency and hematuria.  Musculoskeletal:  Negative for arthralgias and back pain.  Skin:  Negative for rash.  Neurological:  Negative for dizziness, speech difficulty, weakness, numbness and headaches.   Physical Exam Updated Vital Signs BP (!) 172/83 (BP Location: Right Arm)    Pulse 95    Temp (!) 97 F (36.1 C) (Oral)    Resp 18    Ht 5\' 1"  (1.549 m)    Wt 41.8 kg    SpO2 100%    BMI 17.40 kg/m  Physical  Exam Constitutional:      Appearance: She is well-developed.  HENT:     Head: Normocephalic and atraumatic.     Mouth/Throat:     Comments: No visible foreign bodies Eyes:     Pupils: Pupils are equal, round, and reactive to light.  Cardiovascular:     Rate and Rhythm: Normal rate and regular rhythm.     Heart sounds: Normal heart sounds.  Pulmonary:     Effort: Pulmonary effort is normal. No respiratory distress.     Breath sounds: Normal breath sounds. No wheezing or rales.  Chest:     Chest wall: No tenderness.  Abdominal:     General: Bowel sounds are normal.     Palpations: Abdomen is soft.     Tenderness: There is no abdominal tenderness. There is no guarding or rebound.  Musculoskeletal:        General: Normal range of motion.     Cervical back: Normal range of motion and neck supple.  Lymphadenopathy:     Cervical: No cervical adenopathy.  Skin:    General: Skin is warm and dry.     Findings: No rash.  Neurological:     Mental Status: She is alert and oriented to person, place, and time.    ED Results / Procedures / Treatments   Labs (all labs ordered are listed, but only abnormal results are displayed) Labs Reviewed  CBC WITH DIFFERENTIAL/PLATELET  BASIC METABOLIC PANEL    EKG None  Radiology CT Soft Tissue Neck Wo Contrast  Result Date: 07/19/2021 CLINICAL DATA:  Feels like something is stuck in esophagus EXAM: CT NECK WITHOUT CONTRAST TECHNIQUE: Multidetector CT imaging of the neck was performed following the standard protocol without intravenous contrast. RADIATION DOSE REDUCTION: This exam was performed according to the departmental dose-optimization program which includes automated exposure control, adjustment of the mA and/or kV according to patient size and/or use of iterative reconstruction technique. COMPARISON:  None. FINDINGS: Pharynx and larynx: The nasal cavity and nasopharynx are unremarkable. There is periapical lucency around the root of the left  mandibular first premolar and left maxillary canine. The oral cavity and oropharynx are otherwise unremarkable. The parapharyngeal spaces are clear. The hypopharynx and larynx are unremarkable. There is no abnormal mass lesion or fluid collection, within the confines of noncontrast technique. Salivary glands: Parotid and submandibular glands are normal. Thyroid: Left thyroid lobe is somewhat heterogeneous with a nodule measuring up to 1 point cm. No specific imaging follow-up is required. Lymph nodes: There is no pathologic lymphadenopathy in the neck. Vascular: There is calcified atherosclerotic plaque of the aortic arch and bilateral carotid bulbs. Limited intracranial: The imaged portions of the intracranial compartment are unremarkable. Visualized orbits: Bilateral lens implants  are in place. The globes and orbits are otherwise unremarkable. Mastoids and visualized paranasal sinuses: There is soap bubble appearing fluid in the left maxillary sinus. The mastoid air cells are clear. Skeleton: There is multilevel degenerative change of the cervical spine with grade 1 anterolisthesis of C2 on C3 and C3 on C4. There is no acute osseous abnormality or aggressive osseous lesion. Upper chest: There is severe emphysema in the lung apices. There is scarring in the right apex. Other: No radiopaque foreign body is seen in the airway or esophagus to the level imaged. IMPRESSION: 1. No acute findings in the neck. No radiopaque foreign body identified. 2. Soap bubble appearing fluid in the left maxillary sinus could reflect acute sinusitis in the correct clinical setting. 3. Aortic Atherosclerosis (ICD10-I70.0) and Emphysema (ICD10-J43.9). Electronically Signed   By: Valetta Mole M.D.   On: 07/19/2021 10:40    Procedures Procedures    Medications Ordered in ED Medications - No data to display  ED Course/ Medical Decision Making/ A&P                           Medical Decision Making Amount and/or Complexity of Data  Reviewed Independent Historian: spouse External Data Reviewed: notes. Labs: ordered. Decision-making details documented in ED Course. Radiology: ordered and independent interpretation performed. Decision-making details documented in ED Course.  Risk Prescription drug management.   Patient is a 88 year old female who presents with difficulty swallowing and feels like she has something stuck in her throat.  There was anything visualized on physical exam.  She was able to drink some water at bedside and able to keep it down.  Given the foreign body sensation, CT was performed.  Images reviewed by me and the radiologist and shows no evidence of esophageal foreign body.  She does not have any suggestions of a foreign body in the lungs or trachea.  No shortness of breath.  She is able to drink without difficulty.  Her labs are reviewed and are nonconcerning.  She potentially has a scratch on her esophagus.  Will prescribe Carafate.  I did review her chart and found that she has in the past seen [facility] gastroenterology.  I advised her and her husband that if her symptoms have not improved by Monday, she should arrange follow-up with GI or her primary care physician.  Return precautions were given.    Final Clinical Impression(s) / ED Diagnoses Final diagnoses:  Esophagitis    Rx / DC Orders ED Discharge Orders          Ordered    sucralfate (CARAFATE) 1 GM/10ML suspension  3 times daily with meals & bedtime        07/19/21 1053              Malvin Johns, MD 07/19/21 1057    Malvin Johns, MD 07/20/21 1249

## 2021-07-19 NOTE — ED Triage Notes (Signed)
Patient reports that she has possible food stuck in her throat. Patient is able to swallow her own saliva, but when drinking liquids is unable to do so. Patient's husband states SOB, but patient states no more than usual. Patient has a history of COPD.

## 2021-07-19 NOTE — ED Notes (Signed)
EDP at the bedside.  ?

## 2021-07-24 ENCOUNTER — Ambulatory Visit: Payer: Medicare PPO | Admitting: Nurse Practitioner

## 2021-07-24 ENCOUNTER — Encounter: Payer: Self-pay | Admitting: Nurse Practitioner

## 2021-07-24 ENCOUNTER — Other Ambulatory Visit: Payer: Self-pay

## 2021-07-24 VITALS — BP 140/70 | HR 86 | Temp 98.1°F | Ht 61.0 in | Wt 96.0 lb

## 2021-07-24 DIAGNOSIS — J069 Acute upper respiratory infection, unspecified: Secondary | ICD-10-CM | POA: Diagnosis not present

## 2021-07-24 DIAGNOSIS — I350 Nonrheumatic aortic (valve) stenosis: Secondary | ICD-10-CM

## 2021-07-24 DIAGNOSIS — K219 Gastro-esophageal reflux disease without esophagitis: Secondary | ICD-10-CM | POA: Diagnosis not present

## 2021-07-24 DIAGNOSIS — J441 Chronic obstructive pulmonary disease with (acute) exacerbation: Secondary | ICD-10-CM | POA: Diagnosis not present

## 2021-07-24 DIAGNOSIS — J310 Chronic rhinitis: Secondary | ICD-10-CM

## 2021-07-24 MED ORDER — ARFORMOTEROL TARTRATE 15 MCG/2ML IN NEBU
15.0000 ug | INHALATION_SOLUTION | Freq: Two times a day (BID) | RESPIRATORY_TRACT | 2 refills | Status: DC
Start: 2021-07-24 — End: 2021-07-24

## 2021-07-24 MED ORDER — ARFORMOTEROL TARTRATE 15 MCG/2ML IN NEBU
15.0000 ug | INHALATION_SOLUTION | Freq: Two times a day (BID) | RESPIRATORY_TRACT | 2 refills | Status: DC
Start: 2021-07-24 — End: 2021-09-30

## 2021-07-24 MED ORDER — FLUTICASONE PROPIONATE 50 MCG/ACT NA SUSP: 1.0000 | Freq: Every day | NASAL | 2 refills | Status: DC

## 2021-07-24 MED ORDER — PANTOPRAZOLE SODIUM 40 MG PO TBEC: 40.0000 mg | DELAYED_RELEASE_TABLET | Freq: Every day | ORAL | 2 refills | Status: DC

## 2021-07-24 MED ORDER — PREDNISONE 10 MG PO TABS: ORAL_TABLET | ORAL | 0 refills | Status: DC

## 2021-07-24 NOTE — Progress Notes (Signed)
@Patient  ID: Katherine [name], female    DOB: 1928/12/18, 88 y.o.   MRN: [id number]  Chief Complaint  Patient presents with   Follow-up    Patient is going to have a valve replacement done and wants to discuss that.     Referring provider: Elby Showers, MD  HPI: 88 year old female, former smoker (60 pack years) followed for severe COPD, hx of positive PPD, lung nodule, upper airway irritation syndrome and chronic cough. She is a patient of Dr. Agustina Caroli and last seen in office on 09/19/2020. Past medical history significant for aortic stenosis, GERD, HLD, chronic back pain, IDA.  TEST/EVENTS:  06/22/2018 PFTs: FVC 1.74 (89), FEV1 0.97 (68), ratio 56, TLC 106%, DLCO 25% 08/09/2020 CT chest wo contrast: atherosclerosis. CAD. Severe centrilobular emphysema. Stable 6 mm pulm nodule RLL. New mild bandlike scarring or consolidation or apical RUL. T spine deformities  05/21/2021 echocardiogram: EF 60-65%. Mild LVH. GIIDD. Trivial MVR. Mild MVS. Severe AVS.   09/19/2020: OV with Dr. Lamonte Sakai. DuoNeb 4 times a day and budesonide Twice daily. Reported she's only been doing her duonebs 1-2x/day. Respiratory status stable. Stable pulm nodule 08/10/2020 - no plans to repeat given age, associated risk of invasive procedure, and stability x 1.5 yr.   07/24/2021: Today - acute visit Patient presents today with husband for shortness of breath, which started a few weeks ago and has been experiencing it even upon minimal exertion. She has had nasal congestion and drainage as well as hoarseness. She has had a tickle in her throat and frequently is clearing it, but no actual cough. She denies orthopnea, PND, wheezing, chest pain or lower extremity swelling. She has severe AV stenosis and is wanting to undergo valve repair but does not have a date set yet. She continues on atrovent nebs 4x/day and budesonide Twice daily. She has been using her albuterol nebs multiple times a day.   Allergies  Allergen Reactions   Aspirin  Other (See Comments)    Hx of ulcers, stomach issues     Immunization History  Administered Date(s) Administered   Fluad Quad(high Dose 65+) 03/18/2020   Influenza Split 02/20/2018   Influenza Whole 01/29/2010   Influenza, High Dose Seasonal PF 04/13/2014, 02/12/2016, 03/10/2017, 03/19/2018, 04/11/2019   Influenza-Unspecified 03/16/2014, 03/05/2021   PFIZER(Purple Top)SARS-COV-2 Vaccination 08/08/2019, 08/29/2019, 03/18/2020, 02/26/2021   Pneumococcal Conjugate-13 05/12/2016   Pneumococcal Polysaccharide-23 08/08/1996   Tdap 04/10/2003   Zoster Recombinat (Shingrix) 02/22/2019    Past Medical History:  Diagnosis Date   Alcoholic ([place])    36 years sober   Allergy    Anemia    Aortic stenosis    Arthritis    Asthma    patient denies   B12 deficiency    COPD (chronic obstructive pulmonary disease) ([place])    Depression    not recent   Diverticulosis    Esophageal stricture    Headache    cluster headaches occasionally   Hepatic cyst 01/30/2010   Hiatal hernia 1985   patient unaware   Hx of adenomatous colonic polyps 2006   Hyperlipidemia    IBS (irritable bowel syndrome)    PONV (postoperative nausea and vomiting)    no nausea or vomitting after surgery 07/2014   Positive PPD    PUD (peptic ulcer disease)    Shortness of breath dyspnea    SOB WITH EXERTION   (NOT NEW)    Tobacco History: Social History   Tobacco Use  Smoking Status Former  Years: 60.00   Types: Cigarettes   Quit date: 02/02/2010   Years since quitting: 11.4  Smokeless Tobacco Never   Counseling given: Not Answered   Outpatient Medications Prior to Visit  Medication Sig Dispense Refill   albuterol (VENTOLIN HFA) 108 (90 Base) MCG/ACT inhaler INHALE 2 PUFFS INTO THE LUNGS 4 (FOUR) TIMES DAILY AS NEEDED FOR WHEEZING OR SHORTNESS OF BREATH. 36 each 6   ALPRAZolam (XANAX) 0.25 MG tablet TAKE ONE TAB UP TO TWICE DAILY AS NEEDED FOR ANXIETY AND SLEEP (Patient taking differently: Take 0.25 mg by  mouth at bedtime.) 60 tablet 5   budesonide (PULMICORT) 0.5 MG/2ML nebulizer solution Take 0.5 mg by nebulization daily as needed (shortness of breath).     Cyanocobalamin (VITAMIN B-12 PO) Take 1 tablet by mouth daily.     escitalopram (LEXAPRO) 5 MG tablet TAKE 1 TABLET BY MOUTH EVERY DAY 90 tablet 2   ferrous sulfate 325 (65 FE) MG EC tablet Take 1 tablet by mouth once every other day with a glass of orange juice. 30 tablet 3   ipratropium (ATROVENT) 0.02 % nebulizer solution TAKE 2.5 MLS (0.5 MG TOTAL) BY NEBULIZATION 4 (FOUR) TIMES DAILY. 62.5 mL 12   Melatonin 10 MG CAPS Take 10 mg by mouth daily.     Multiple Vitamins-Minerals (MULTIVITAMIN WITH MINERALS) tablet Take 1 tablet by mouth daily.     Polysaccharide Iron Complex (POLYSACCHARIDE IRON PO) Take 1 capsule by mouth daily.     sucralfate (CARAFATE) 1 GM/10ML suspension Take 10 mLs (1 g total) by mouth 4 (four) times daily -  with meals and at bedtime. 420 mL 0   traMADol (ULTRAM) 50 MG tablet Take 50 mg by mouth every 6 (six) hours as needed for moderate pain.      traZODone (DESYREL) 50 MG tablet TAKE 3 TABLETS BY MOUTH AT BEDTIME (Patient taking differently: Take 100 mg by mouth at bedtime.) 270 tablet 0   No facility-administered medications prior to visit.     Review of Systems:   Constitutional: No weight loss or gain, night sweats, fevers, chills, fatigue, or lassitude. HEENT: No headaches, difficulty swallowing, tooth/dental problems, or sore throat. No sneezing, itching, ear ache. +nasal congestion, hoarseness, rhinorrhea, post nasal drip CV:  No chest pain, orthopnea, PND, swelling in lower extremities, anasarca, dizziness, palpitations, syncope Resp: +shortness of breath with minimal exertion. No excess mucus or change in color of mucus. No productive or non-productive. No hemoptysis. No wheezing.  No chest wall deformity GI:  No heartburn, indigestion, abdominal pain, nausea, vomiting, diarrhea, change in bowel habits,  loss of appetite, bloody stools.  GU: No dysuria, change in color of urine, urgency or frequency.  No flank pain, no hematuria  Skin: No rash, lesions, ulcerations MSK:  No joint pain or swelling.  No decreased range of motion.  No back pain. Neuro: No dizziness or lightheadedness.  Psych: No depression or anxiety. Mood stable.     Physical Exam:  BP 140/70 (BP Location: Left Arm, Patient Position: Sitting, Cuff Size: Normal)    Pulse 86    Temp 98.1 F (36.7 C) (Oral)    Ht 5\' 1"  (1.549 m)    Wt 96 lb (43.5 kg)    SpO2 95%    BMI 18.14 kg/m   GEN: Pleasant, interactive, well-appearing; in no acute distress. HEENT:  Normocephalic and atraumatic. EACs patent bilaterally. TM pearly gray with present light reflex bilaterally. PERRLA. Sclera white. Nasal turbinates pink, moist and patent bilaterally. No  rhinorrhea present. Oropharynx pink and moist, without exudate or edema. No lesions, ulcerations, or postnasal drip.  NECK:  Supple w/ fair ROM. No JVD present. Normal carotid impulses w/o bruits. Thyroid symmetrical with no goiter or nodules palpated. No lymphadenopathy.   CV: RRR, systolic murmur, no peripheral edema. Pulses intact, +2 bilaterally. No cyanosis, pallor or clubbing. PULMONARY:  Unlabored, regular breathing. Clear bilaterally A&P w/o wheezes/rales/rhonchi. No accessory muscle use. No dullness to percussion. GI: BS present and normoactive. Soft, non-tender to palpation. No organomegaly or masses detected. No CVA tenderness. MSK: No erythema, warmth or tenderness. Cap refil <2 sec all extrem. No deformities or joint swelling noted.  Neuro: A/Ox3. No focal deficits noted.   Skin: Warm, no lesions or rashe Psych: Normal affect and behavior. Judgement and thought content appropriate.     Lab Results:  CBC    Component Value Date/Time   WBC 6.2 07/19/2021 0946   RBC 4.14 07/19/2021 0946   HGB 12.3 07/19/2021 0946   HGB 12.4 06/24/2021 1555   HCT 38.5 07/19/2021 0946   HCT  38.1 06/24/2021 1555   PLT 168 07/19/2021 0946   PLT 225 06/24/2021 1555   MCV 93.0 07/19/2021 0946   MCV 92 06/24/2021 1555   MCH 29.7 07/19/2021 0946   MCHC 31.9 07/19/2021 0946   RDW 12.5 07/19/2021 0946   RDW 12.8 06/24/2021 1555   LYMPHSABS 0.7 07/19/2021 0946   MONOABS 0.3 07/19/2021 0946   EOSABS 0.1 07/19/2021 0946   BASOSABS 0.1 07/19/2021 0946    BMET    Component Value Date/Time   NA 141 07/19/2021 0946   NA 144 06/24/2021 1555   K 3.7 07/19/2021 0946   CL 106 07/19/2021 0946   CO2 26 07/19/2021 0946   GLUCOSE 97 07/19/2021 0946   BUN 20 07/19/2021 0946   BUN 27 06/24/2021 1555   CREATININE 0.87 07/19/2021 0946   CREATININE 1.18 (H) 01/22/2021 1452   CREATININE 0.99 (H) 11/27/2020 0931   CALCIUM 9.2 07/19/2021 0946   GFRNONAA >60 07/19/2021 0946   GFRNONAA 44 (L) 01/22/2021 1452   GFRNONAA 50 (L) 11/27/2020 0931   GFRAA 58 (L) 11/27/2020 0931    BNP No results found for: BNP   Imaging:  CT Soft Tissue Neck Wo Contrast  Result Date: 07/19/2021 CLINICAL DATA:  Feels like something is stuck in esophagus EXAM: CT NECK WITHOUT CONTRAST TECHNIQUE: Multidetector CT imaging of the neck was performed following the standard protocol without intravenous contrast. RADIATION DOSE REDUCTION: This exam was performed according to the departmental dose-optimization program which includes automated exposure control, adjustment of the mA and/or kV according to patient size and/or use of iterative reconstruction technique. COMPARISON:  None. FINDINGS: Pharynx and larynx: The nasal cavity and nasopharynx are unremarkable. There is periapical lucency around the root of the left mandibular first premolar and left maxillary canine. The oral cavity and oropharynx are otherwise unremarkable. The parapharyngeal spaces are clear. The hypopharynx and larynx are unremarkable. There is no abnormal mass lesion or fluid collection, within the confines of noncontrast technique. Salivary glands:  Parotid and submandibular glands are normal. Thyroid: Left thyroid lobe is somewhat heterogeneous with a nodule measuring up to 1 point cm. No specific imaging follow-up is required. Lymph nodes: There is no pathologic lymphadenopathy in the neck. Vascular: There is calcified atherosclerotic plaque of the aortic arch and bilateral carotid bulbs. Limited intracranial: The imaged portions of the intracranial compartment are unremarkable. Visualized orbits: Bilateral lens implants are in place. The globes  and orbits are otherwise unremarkable. Mastoids and visualized paranasal sinuses: There is soap bubble appearing fluid in the left maxillary sinus. The mastoid air cells are clear. Skeleton: There is multilevel degenerative change of the cervical spine with grade 1 anterolisthesis of C2 on C3 and C3 on C4. There is no acute osseous abnormality or aggressive osseous lesion. Upper chest: There is severe emphysema in the lung apices. There is scarring in the right apex. Other: No radiopaque foreign body is seen in the airway or esophagus to the level imaged. IMPRESSION: 1. No acute findings in the neck. No radiopaque foreign body identified. 2. Soap bubble appearing fluid in the left maxillary sinus could reflect acute sinusitis in the correct clinical setting. 3. Aortic Atherosclerosis (ICD10-I70.0) and Emphysema (ICD10-J43.9). Electronically Signed   By: Valetta Mole M.D.   On: 07/19/2021 10:40   CARDIAC CATHETERIZATION  Result Date: 07/11/2021   Colon [place] Cx to Prox Cx lesion is 30% stenosed.   Prox RCA lesion is 30% stenosed. Mild non-obstructive CAD Severe aortic stenosis by echo. I did not cross the aortic valve today. RA 2 RV 36/0/4 PA 38/13 mean 24 PCWP 11 AO 145/57 Recommendations: Continue workup for TAVR      PFT Results Latest Ref Rng & Units 06/22/2018  FVC-Pre L 1.72  FVC-Predicted Pre % 89  FVC-Post L 1.74  FVC-Predicted Post % 89  Pre FEV1/FVC % % 54  Post FEV1/FCV % % 56  FEV1-Pre L 0.94   FEV1-Predicted Pre % 66  FEV1-Post L 0.97  DLCO uncorrected ml/min/mmHg 5.14  DLCO [facility]% % 25  DLVA Predicted % 40  TLC L 4.91  TLC % Predicted % 106  RV % Predicted % 131    No results found for: NITRICOXIDE      Assessment & Plan:   COPD with acute exacerbation (HCC) Progressive worsening of SOB over past few weeks. Suspect multifactorial r/t COPD flare from URI and severe AV stenosis. Will add LABA to regimen with Brovana Twice daily; hopefully this will decrease the number of times she needs to use albuterol. Prednisone taper. Walking oximetry without desaturation. Close follow up.   Patient Instructions  Continue ipatropium 4 times a day -Continue budesonide Twice daily. Brush tongue and rinse mouth afterwards -Continue Albuterol inhaler 2 puffs or 3 mL neb every 6 hours as needed for shortness of breath or wheezing. Notify if symptoms persist despite rescue inhaler/neb use. -Continue carafate 10 mL four times a day with meals and at bedtime  Start Flonase nasal spray 1-2 sprays each nostril daily Start Zyrtec 10 mg daily  Start Protonix 40 mg daily 30 minutes before breakfast Start Brovana neb 2 mL Twice daily   -Prednisone taper. 4 tabs for 2 days, then 3 tabs for 2 days, 2 tabs for 2 days, then 1 tab for 2 days, then stop  Notify if worsening breathlessness, cough, mucus production, fatigue, or wheezing occurs.  Maintain up to date vaccinations, including influenza, COVID, and pneumococcal.  Wash your hands often and avoid sick exposures.  Encouraged masking in crowds.  Avoid triggers, when possible.  Exercise, as tolerated. Notify if worsening symptoms upon exertion occur.   Follow up in one week with Dr. Lamonte Sakai or Alanson Aly. If symptoms do not improve or worsen, please contact office for sooner follow up or seek emergency care.   Severe aortic stenosis Suspect contributing to DOE. Currently undergoing evaluation for valve repair.  GE reflux Recently went  to the ED with sensation of something  stuck in her throat. Does have a history of some reflux and previously followed by GI. She was started on carafate four times a day. Will add PPI therapy as this could be contributing to her hoarseness.   URI (upper respiratory infection) Supportive care. Rx Flonase daily. Limit throat clearing. Sugar free hard candies throughout the day.    Clayton Bibles, NP 07/24/2021  Pt aware and understands NP's role.

## 2021-07-24 NOTE — Patient Instructions (Addendum)
Continue ipatropium 4 times a day -Continue budesonide Twice daily. Brush tongue and rinse mouth afterwards -Continue Albuterol inhaler 2 puffs or 3 mL neb every 6 hours as needed for shortness of breath or wheezing. Notify if symptoms persist despite rescue inhaler/neb use. -Continue carafate 10 mL four times a day with meals and at bedtime  Start Flonase nasal spray 1-2 sprays each nostril daily Start Zyrtec 10 mg daily  Start Protonix 40 mg daily 30 minutes before breakfast Start Brovana neb 2 mL Twice daily   -Prednisone taper. 4 tabs for 2 days, then 3 tabs for 2 days, 2 tabs for 2 days, then 1 tab for 2 days, then stop  Notify if worsening breathlessness, cough, mucus production, fatigue, or wheezing occurs.  Maintain up to date vaccinations, including influenza, COVID, and pneumococcal.  Wash your hands often and avoid sick exposures.  Encouraged masking in crowds.  Avoid triggers, when possible.  Exercise, as tolerated. Notify if worsening symptoms upon exertion occur.   Follow up in one week with Dr. Lamonte Sakai or Alanson Aly. If symptoms do not improve or worsen, please contact office for sooner follow up or seek emergency care.

## 2021-07-24 NOTE — Assessment & Plan Note (Addendum)
Supportive care. Rx Flonase daily. Limit throat clearing. Sugar free hard candies throughout the day.

## 2021-07-24 NOTE — Assessment & Plan Note (Addendum)
Progressive worsening of SOB over past few weeks. Suspect multifactorial r/t COPD flare from URI and severe AV stenosis. Will add LABA to regimen with Brovana Twice daily; hopefully this will decrease the number of times she needs to use albuterol. Prednisone taper. Walking oximetry without desaturation. Close follow up.   Patient Instructions  Continue ipatropium 4 times a day -Continue budesonide Twice daily. Brush tongue and rinse mouth afterwards -Continue Albuterol inhaler 2 puffs or 3 mL neb every 6 hours as needed for shortness of breath or wheezing. Notify if symptoms persist despite rescue inhaler/neb use. -Continue carafate 10 mL four times a day with meals and at bedtime  Start Flonase nasal spray 1-2 sprays each nostril daily Start Zyrtec 10 mg daily  Start Protonix 40 mg daily 30 minutes before breakfast Start Brovana neb 2 mL Twice daily   -Prednisone taper. 4 tabs for 2 days, then 3 tabs for 2 days, 2 tabs for 2 days, then 1 tab for 2 days, then stop  Notify if worsening breathlessness, cough, mucus production, fatigue, or wheezing occurs.  Maintain up to date vaccinations, including influenza, COVID, and pneumococcal.  Wash your hands often and avoid sick exposures.  Encouraged masking in crowds.  Avoid triggers, when possible.  Exercise, as tolerated. Notify if worsening symptoms upon exertion occur.   Follow up in one week with Dr. Lamonte Sakai or Alanson Aly. If symptoms do not improve or worsen, please contact office for sooner follow up or seek emergency care.

## 2021-07-24 NOTE — Assessment & Plan Note (Signed)
Suspect contributing to DOE. Currently undergoing evaluation for valve repair.

## 2021-07-24 NOTE — Assessment & Plan Note (Signed)
Recently went to the ED with sensation of something stuck in her throat. Does have a history of some reflux and previously followed by GI. She was started on carafate four times a day. Will add PPI therapy as this could be contributing to her hoarseness.

## 2021-07-29 ENCOUNTER — Encounter (HOSPITAL_COMMUNITY): Payer: Self-pay

## 2021-07-29 ENCOUNTER — Other Ambulatory Visit: Payer: Self-pay | Admitting: Cardiology

## 2021-07-29 ENCOUNTER — Ambulatory Visit (HOSPITAL_COMMUNITY)
Admission: RE | Admit: 2021-07-29 | Discharge: 2021-07-29 | Disposition: A | Discharge status: Home / Self Care | Payer: Medicare PPO | Source: Ambulatory Visit | Attending: Cardiology | Admitting: Cardiology

## 2021-07-29 ENCOUNTER — Other Ambulatory Visit: Payer: Self-pay

## 2021-07-29 DIAGNOSIS — I350 Nonrheumatic aortic (valve) stenosis: Secondary | ICD-10-CM | POA: Diagnosis not present

## 2021-07-29 DIAGNOSIS — R911 Solitary pulmonary nodule: Secondary | ICD-10-CM | POA: Diagnosis not present

## 2021-07-29 DIAGNOSIS — J439 Emphysema, unspecified: Secondary | ICD-10-CM | POA: Diagnosis not present

## 2021-07-29 DIAGNOSIS — K5730 Diverticulosis of large intestine without perforation or abscess without bleeding: Secondary | ICD-10-CM | POA: Diagnosis not present

## 2021-07-29 DIAGNOSIS — E042 Nontoxic multinodular goiter: Secondary | ICD-10-CM | POA: Diagnosis not present

## 2021-07-29 DIAGNOSIS — R188 Other ascites: Secondary | ICD-10-CM | POA: Diagnosis not present

## 2021-07-29 MED ORDER — IOHEXOL 350 MG/ML SOLN
95.0000 mL | Freq: Once | INTRAVENOUS | Status: AC | PRN
  Administered 2021-07-29: 95 mL via INTRAVENOUS

## 2021-07-31 ENCOUNTER — Other Ambulatory Visit: Payer: Self-pay

## 2021-07-31 ENCOUNTER — Ambulatory Visit (INDEPENDENT_AMBULATORY_CARE_PROVIDER_SITE_OTHER): Payer: Medicare PPO | Admitting: Nurse Practitioner

## 2021-07-31 ENCOUNTER — Encounter: Payer: Self-pay | Admitting: Nurse Practitioner

## 2021-07-31 DIAGNOSIS — K219 Gastro-esophageal reflux disease without esophagitis: Secondary | ICD-10-CM

## 2021-07-31 DIAGNOSIS — J449 Chronic obstructive pulmonary disease, unspecified: Secondary | ICD-10-CM

## 2021-07-31 DIAGNOSIS — F32A Depression, unspecified: Secondary | ICD-10-CM | POA: Diagnosis not present

## 2021-07-31 DIAGNOSIS — J069 Acute upper respiratory infection, unspecified: Secondary | ICD-10-CM

## 2021-07-31 DIAGNOSIS — F339 Major depressive disorder, recurrent, unspecified: Secondary | ICD-10-CM | POA: Insufficient documentation

## 2021-07-31 NOTE — Assessment & Plan Note (Signed)
Resolved GERD symptoms with PPI therapy.

## 2021-07-31 NOTE — Assessment & Plan Note (Addendum)
AECOPD resolved. Improved SOB. Suspect residual DOE r/t progression of disease and severe AS. Awaiting Brovana rx. Added to regimen to hopefully decrease the number of times she needs to use albuterol. Continue albuterol PRN. Previous walking oximetry without desaturation.   Patient Instructions  -Continue ipatropium 4 times a day -Continue budesonide Twice daily. Brush tongue and rinse mouth afterwards -Continue Albuterol inhaler 2 puffs or 3 mL neb every 6 hours as needed for shortness of breath or wheezing. Notify if symptoms persist despite rescue inhaler/neb use. -Continue carafate 10 mL four times a day with meals and at bedtime -Continue Flonase nasal spray 1-2 sprays each nostril daily -Continue Zyrtec 10 mg daily  -Continue Protonix 40 mg daily 30 minutes before breakfast  -Start Brovana neb 2 mL Twice daily once you get it.   Notify if worsening breathlessness, cough, mucus production, fatigue, or wheezing occurs.  Maintain up to date vaccinations, including influenza, COVID, and pneumococcal.  Wash your hands often and avoid sick exposures.  Encouraged masking in crowds.  Avoid triggers, when possible.  Exercise, as tolerated. Notify if worsening symptoms upon exertion occur.    Follow up in one month with Dr. Lamonte Sakai or Alanson Aly. If symptoms do not improve or worsen, please contact office for sooner follow up or seek emergency care

## 2021-07-31 NOTE — Assessment & Plan Note (Signed)
Resolving with improved symptoms. Continue flonase and zyrtec.

## 2021-07-31 NOTE — Patient Instructions (Addendum)
-  Continue ipatropium 4 times a day -Continue budesonide Twice daily. Brush tongue and rinse mouth afterwards -Continue Albuterol inhaler 2 puffs or 3 mL neb every 6 hours as needed for shortness of breath or wheezing. Notify if symptoms persist despite rescue inhaler/neb use. -Continue carafate 10 mL four times a day with meals and at bedtime -Continue Flonase nasal spray 1-2 sprays each nostril daily -Continue Zyrtec 10 mg daily  -Continue Protonix 40 mg daily 30 minutes before breakfast  -Start Brovana neb 2 mL Twice daily once you get it.   Notify if worsening breathlessness, cough, mucus production, fatigue, or wheezing occurs.  Maintain up to date vaccinations, including influenza, COVID, and pneumococcal.  Wash your hands often and avoid sick exposures.  Encouraged masking in crowds.  Avoid triggers, when possible.  Exercise, as tolerated. Notify if worsening symptoms upon exertion occur.    Follow up in one month with Dr. Lamonte Sakai or Alanson Aly. If symptoms do not improve or worsen, please contact office for sooner follow up or seek emergency care

## 2021-07-31 NOTE — Progress Notes (Signed)
@Patient  ID: Katherine [name], female    DOB: 01/10/1929, 88 y.o.   MRN: [id number]  Chief Complaint  Patient presents with   Follow-up    She reports that she feels some better since last OV,.     Referring provider: Elby Showers, MD  HPI: 88 year old female, former smoker (60 pack years) followed for severe COPD, hx of positive PPD, lung nodule, upper airway irritation syndrome and chronic cough. She is a patient of Dr. Agustina Caroli and last seen in office on 07/24/2021 by [facility] NP. Past medical history significant for aortic stenosis, GERD, HLD, chronic back pain, IDA.  TEST/EVENTS:  06/22/2018: PFTs: FVC 1.74 (89), FEV1 0.97 (68), ratio 56, TLC 106%, DLCO 25% 08/09/2020 CT chest without contrast: atherosclerosis. CAD. Severe centrilobular emphysema. Stable 6 mm pulm nodule RLL. New mild bandlike scarring or consolidation RUL. T spine deformities. 05/21/2021 echocardiogram: EF 60-65%. Mild LVH. GIIDD. Trivial MR. Mild MS. Severe AS.  09/19/2020: OV with Dr. Lamonte Sakai. DuoNeb 4 times a day and budesonide Twice daily. Reported only doing duonebs 1-2x/day. Respiratory status stable. Stable pulm nodule 08/10/2020 - no plans to repeat given age, associated risk of invasive procedure and stability x 1.5 yr  07/24/2021: OV with Dharma Pare NP. Worsening SOB and URI symptoms. Awaiting repair of severe AR - no date set. Uses atrovent and albuterol between 2-4x/day. Added Brovana on for ease of use and less frequent nebs. Prednisone taper. Walking oximetry without desaturation. Started zyrtec, flonase. GERD symptoms - started on PPI.   07/31/2021: Today - follow up Patient presents today with husband for follow up. She reports feeling better. She still has some shortness of breath upon exertion but it has improved. Her hoarseness has mostly resolved and her nasal congestion improved. She did not start on her Brovana nebs because she hasn't gotten them from the pharmacy. She continues on budesonide neb twice a day and  atrovent 2-4 times a day. She uses her albuterol around 4 times a day. She continues on flonase, zyrtec and protonix. She feels as though her GERD symptoms have resolved. She has an appointment next week with Dr. Cyndia Bent for workup for AS repair.   Allergies  Allergen Reactions   Aspirin Other (See Comments)    Hx of ulcers, stomach issues     Immunization History  Administered Date(s) Administered   Fluad Quad(high Dose 65+) 03/18/2020   Influenza Split 02/20/2018   Influenza Whole 01/29/2010   Influenza, High Dose Seasonal PF 04/13/2014, 02/12/2016, 03/10/2017, 03/19/2018, 04/11/2019   Influenza-Unspecified 03/16/2014, 03/05/2021   PFIZER(Purple Top)SARS-COV-2 Vaccination 08/08/2019, 08/29/2019, 03/18/2020, 02/26/2021   Pneumococcal Conjugate-13 05/12/2016   Pneumococcal Polysaccharide-23 08/08/1996   Tdap 04/10/2003   Zoster Recombinat (Shingrix) 02/22/2019    Past Medical History:  Diagnosis Date   Alcoholic ([place])    36 years sober   Allergy    Anemia    Aortic stenosis    Arthritis    Asthma    patient denies   B12 deficiency    COPD (chronic obstructive pulmonary disease) ([place])    Depression    not recent   Diverticulosis    Esophageal stricture    Headache    cluster headaches occasionally   Hepatic cyst 01/30/2010   Hiatal hernia 1985   patient unaware   Hx of adenomatous colonic polyps 2006   Hyperlipidemia    IBS (irritable bowel syndrome)    PONV (postoperative nausea and vomiting)    no nausea or vomitting after surgery 07/2014  Positive PPD    PUD (peptic ulcer disease)    Shortness of breath dyspnea    SOB WITH EXERTION   (NOT NEW)    Tobacco History: Social History   Tobacco Use  Smoking Status Former   Years: 60.00   Types: Cigarettes   Quit date: 02/02/2010   Years since quitting: 11.4  Smokeless Tobacco Never   Counseling given: Not Answered   Outpatient Medications Prior to Visit  Medication Sig Dispense Refill   albuterol  (VENTOLIN HFA) 108 (90 Base) MCG/ACT inhaler INHALE 2 PUFFS INTO THE LUNGS 4 (FOUR) TIMES DAILY AS NEEDED FOR WHEEZING OR SHORTNESS OF BREATH. 36 each 6   ALPRAZolam (XANAX) 0.25 MG tablet TAKE ONE TAB UP TO TWICE DAILY AS NEEDED FOR ANXIETY AND SLEEP (Patient taking differently: Take 0.25 mg by mouth at bedtime.) 60 tablet 5   arformoterol (BROVANA) 15 MCG/2ML NEBU Take 2 mLs (15 mcg total) by nebulization 2 (two) times daily. 120 mL 2   budesonide (PULMICORT) 0.5 MG/2ML nebulizer solution Take 0.5 mg by nebulization daily as needed (shortness of breath).     Cyanocobalamin (VITAMIN B-12 PO) Take 1 tablet by mouth daily.     escitalopram (LEXAPRO) 5 MG tablet TAKE 1 TABLET BY MOUTH EVERY DAY 90 tablet 2   ferrous sulfate 325 (65 FE) MG EC tablet Take 1 tablet by mouth once every other day with a glass of orange juice. 30 tablet 3   fluticasone (FLONASE) 50 MCG/ACT nasal spray Place 1 spray into both nostrils daily. 18.2 mL 2   ipratropium (ATROVENT) 0.02 % nebulizer solution TAKE 2.5 MLS (0.5 MG TOTAL) BY NEBULIZATION 4 (FOUR) TIMES DAILY. 62.5 mL 12   Melatonin 10 MG CAPS Take 10 mg by mouth daily.     Multiple Vitamins-Minerals (MULTIVITAMIN WITH MINERALS) tablet Take 1 tablet by mouth daily.     pantoprazole (PROTONIX) 40 MG tablet Take 1 tablet (40 mg total) by mouth daily. 30 tablet 2   Polysaccharide Iron Complex (POLYSACCHARIDE IRON PO) Take 1 capsule by mouth daily.     predniSONE (DELTASONE) 10 MG tablet 4 tabs for 2 days, then 3 tabs for 2 days, 2 tabs for 2 days, then 1 tab for 2 days, then stop 20 tablet 0   sucralfate (CARAFATE) 1 GM/10ML suspension Take 10 mLs (1 g total) by mouth 4 (four) times daily -  with meals and at bedtime. 420 mL 0   traMADol (ULTRAM) 50 MG tablet Take 50 mg by mouth every 6 (six) hours as needed for moderate pain.      traZODone (DESYREL) 50 MG tablet TAKE 3 TABLETS BY MOUTH AT BEDTIME (Patient taking differently: Take 100 mg by mouth at bedtime.) 270 tablet 0    budesonide (PULMICORT) 0.25 MG/2ML nebulizer solution Inhale into the lungs.     No facility-administered medications prior to visit.     Review of Systems:   Constitutional: No weight loss or gain, night sweats, fevers, chills, fatigue, or lassitude. HEENT: No headaches, difficulty swallowing, tooth/dental problems, or sore throat. No sneezing, itching, ear ache. +minimal nasal congestion CV:  No chest pain, orthopnea, PND, swelling in lower extremities, anasarca, dizziness, palpitations, syncope Resp: +shortness of breath with exertion (improved). No excess mucus or change in color of mucus. No productive or non-productive. No hemoptysis. No wheezing.  No chest wall deformity GI:  No heartburn, indigestion, abdominal pain, nausea, vomiting, diarrhea, change in bowel habits, loss of appetite, bloody stools.  GU: No dysuria, change  in color of urine, urgency or frequency.  No flank pain, no hematuria  Skin: No rash, lesions, ulcerations MSK:  No joint pain or swelling.  No decreased range of motion.  No back pain. Neuro: No dizziness or lightheadedness.  Psych: No depression or anxiety. Mood stable.     Physical Exam:  BP 106/62 (BP Location: Left Arm, Patient Position: Sitting, Cuff Size: Normal)    Pulse 94    Temp 98.2 F (36.8 C) (Oral)    Ht 5\' 1"  (1.549 m)    Wt 97 lb 6.4 oz (44.2 kg)    SpO2 95%    BMI 18.40 kg/m   GEN: Pleasant, interactive, elderly, frail appearing; in no acute distress HEENT:  Normocephalic and atraumatic. EACs patent bilaterally. TM pearly gray with present light reflex bilaterally. PERRLA. Sclera white. Nasal turbinates pink, moist and patent bilaterally. No rhinorrhea present. Oropharynx pink and moist, without exudate or edema. No lesions, ulcerations, or postnasal drip.  NECK:  Supple w/ fair ROM. No JVD present. Normal carotid impulses w/o bruits. Thyroid symmetrical with no goiter or nodules palpated. No lymphadenopathy.   CV: RRR, systolic murmur,  no r/g, no peripheral edema. Pulses intact, +2 bilaterally. No cyanosis, pallor or clubbing. PULMONARY:  Unlabored, regular breathing. Clear bilaterally A&P w/o wheezes/rales/rhonchi. No accessory muscle use. No dullness to percussion. GI: BS present and normoactive. Soft, non-tender to palpation. No organomegaly or masses detected. No CVA tenderness. MSK: No erythema, warmth or tenderness. Cap refil <2 sec all extrem. No deformities or joint swelling noted.  Neuro: A/Ox3. No focal deficits noted.   Skin: Warm, no lesions or rashe Psych: Normal affect and behavior. Judgement and thought content appropriate.     Lab Results:  CBC    Component Value Date/Time   WBC 6.2 07/19/2021 0946   RBC 4.14 07/19/2021 0946   HGB 12.3 07/19/2021 0946   HGB 12.4 06/24/2021 1555   HCT 38.5 07/19/2021 0946   HCT 38.1 06/24/2021 1555   PLT 168 07/19/2021 0946   PLT 225 06/24/2021 1555   MCV 93.0 07/19/2021 0946   MCV 92 06/24/2021 1555   MCH 29.7 07/19/2021 0946   MCHC 31.9 07/19/2021 0946   RDW 12.5 07/19/2021 0946   RDW 12.8 06/24/2021 1555   LYMPHSABS 0.7 07/19/2021 0946   MONOABS 0.3 07/19/2021 0946   EOSABS 0.1 07/19/2021 0946   BASOSABS 0.1 07/19/2021 0946    BMET    Component Value Date/Time   NA 141 07/19/2021 0946   NA 144 06/24/2021 1555   K 3.7 07/19/2021 0946   CL 106 07/19/2021 0946   CO2 26 07/19/2021 0946   GLUCOSE 97 07/19/2021 0946   BUN 20 07/19/2021 0946   BUN 27 06/24/2021 1555   CREATININE 0.87 07/19/2021 0946   CREATININE 1.18 (H) 01/22/2021 1452   CREATININE 0.99 (H) 11/27/2020 0931   CALCIUM 9.2 07/19/2021 0946   GFRNONAA >60 07/19/2021 0946   GFRNONAA 44 (L) 01/22/2021 1452   GFRNONAA 50 (L) 11/27/2020 0931   GFRAA 58 (L) 11/27/2020 0931    BNP No results found for: BNP   Imaging:  CT Soft Tissue Neck Wo Contrast  Result Date: 07/19/2021 CLINICAL DATA:  Feels like something is stuck in esophagus EXAM: CT NECK WITHOUT CONTRAST TECHNIQUE:  Multidetector CT imaging of the neck was performed following the standard protocol without intravenous contrast. RADIATION DOSE REDUCTION: This exam was performed according to the departmental dose-optimization program which includes automated exposure control, adjustment of the mA  and/or kV according to patient size and/or use of iterative reconstruction technique. COMPARISON:  None. FINDINGS: Pharynx and larynx: The nasal cavity and nasopharynx are unremarkable. There is periapical lucency around the root of the left mandibular first premolar and left maxillary canine. The oral cavity and oropharynx are otherwise unremarkable. The parapharyngeal spaces are clear. The hypopharynx and larynx are unremarkable. There is no abnormal mass lesion or fluid collection, within the confines of noncontrast technique. Salivary glands: Parotid and submandibular glands are normal. Thyroid: Left thyroid lobe is somewhat heterogeneous with a nodule measuring up to 1 point cm. No specific imaging follow-up is required. Lymph nodes: There is no pathologic lymphadenopathy in the neck. Vascular: There is calcified atherosclerotic plaque of the aortic arch and bilateral carotid bulbs. Limited intracranial: The imaged portions of the intracranial compartment are unremarkable. Visualized orbits: Bilateral lens implants are in place. The globes and orbits are otherwise unremarkable. Mastoids and visualized paranasal sinuses: There is soap bubble appearing fluid in the left maxillary sinus. The mastoid air cells are clear. Skeleton: There is multilevel degenerative change of the cervical spine with grade 1 anterolisthesis of C2 on C3 and C3 on C4. There is no acute osseous abnormality or aggressive osseous lesion. Upper chest: There is severe emphysema in the lung apices. There is scarring in the right apex. Other: No radiopaque foreign body is seen in the airway or esophagus to the level imaged. IMPRESSION: 1. No acute findings in the  neck. No radiopaque foreign body identified. 2. Soap bubble appearing fluid in the left maxillary sinus could reflect acute sinusitis in the correct clinical setting. 3. Aortic Atherosclerosis (ICD10-I70.0) and Emphysema (ICD10-J43.9). Electronically Signed   By: Valetta Mole M.D.   On: 07/19/2021 10:40   CARDIAC CATHETERIZATION  Result Date: 07/11/2021   Colon Flattery Cx to Prox Cx lesion is 30% stenosed.   Prox RCA lesion is 30% stenosed. Mild non-obstructive CAD Severe aortic stenosis by echo. I did not cross the aortic valve today. RA 2 RV 36/0/4 PA 38/13 mean 24 PCWP 11 AO 145/57 Recommendations: Continue workup for TAVR   CT CORONARY MORPH W/CTA COR W/SCORE W/CA W/CM &/OR WO/CM  Addendum Date: 07/29/2021   ADDENDUM REPORT: 07/29/2021 12:17 CLINICAL DATA:  Aortic stenosis EXAM: Cardiac TAVR CT TECHNIQUE: The patient was scanned on a Siemens Force 294 slice scanner. A 120 kV retrospective scan was triggered in the descending thoracic aorta at 111 HU's. Gantry rotation speed was 270 msecs and collimation was .9 mm. No beta blockade or nitro were given. The 3D data set was reconstructed in 5% intervals of the R-R cycle. Systolic and diastolic phases were analyzed on a dedicated work station using MPR, MIP and VRT modes. The patient received 80 cc of contrast. FINDINGS: Aortic Valve: Tri leaflet calcified with restricted leaflet motion Functionally bi cuspid with partial fusion of left and right cusps Aorta: No aneurysm normal arch vessels moderate calcific atherosclerosis Sinotubular Junction: 25 mm Ascending Thoracic Aorta: 30 mm Aortic Arch: 24 mm Descending Thoracic Aorta: 23 mm Sinus of Valsalva Measurements: Non-coronary: 27.7 mm Right - coronary: 24.2 mm Left - coronary: 27.2 mm Coronary Artery Height above Annulus: Left Main: 12.5 mm above annulus Right Coronary: 12.8 mm above annulus Virtual Basal Annulus Measurements: Maximum/Minimum Diameter: 23.04 mm x 19.4 mm Perimeter: 69 mm Area: 359 mm2 Coronary  Arteries: Sufficient height above annulus for deployment Optimum Fluoroscopic Angle for Delivery: LAO 7 Caudal 13 degrees IMPRESSION: 1. Functionally bi cuspid AV with annular area 359 mm2  suitable for a 23 mm Sapien 3 valve 2.  Coronary arteries sufficient height above annulus for deployment 3.  Membranous septal length 9.6 mm 4.  AV calcium score 2142 5. Optimum angiographic angle for deployment LAO 7 Caudal 13 degrees [place] Electronically Signed   By: Jenkins Rouge M.D.   On: 07/29/2021 12:17   Result Date: 07/29/2021 EXAM: OVER-READ INTERPRETATION  CT CHEST The following report is an over-read performed by radiologist Dr. Salvatore Marvel of [facility], [place] on 07/29/2021. This over-read does not include interpretation of cardiac or coronary anatomy or pathology. The coronary CTA interpretation by the cardiologist is attached. COMPARISON:  08/09/2020 chest CT. FINDINGS: Please see the separate concurrent chest CT angiogram report for details. IMPRESSION: Please see the separate concurrent chest CT angiogram report for details. Electronically Signed: By: Ilona Sorrel M.D. On: 07/29/2021 11:19   CT ANGIO CHEST AORTA W/CM & OR WO/CM  Result Date: 07/29/2021 CLINICAL DATA:  Aortic valve replacement (TAVR), pre-op eval. EXAM: CT ANGIOGRAPHY CHEST, ABDOMEN AND PELVIS TECHNIQUE: Multidetector CT imaging through the chest, abdomen and pelvis was performed using the standard protocol during bolus administration of intravenous contrast. Multiplanar reconstructed images and MIPs were obtained and reviewed to evaluate the vascular anatomy. RADIATION DOSE REDUCTION: This exam was performed according to the departmental dose-optimization program which includes automated exposure control, adjustment of the mA and/or kV according to patient size and/or use of iterative reconstruction technique. CONTRAST:  51mL OMNIPAQUE IOHEXOL 350 MG/ML SOLN COMPARISON:  08/09/2020 chest CT. 06/23/2019 CT chest, abdomen and  pelvis. FINDINGS: CTA CHEST FINDINGS Cardiovascular: Top-normal heart size. Diffuse thickening and coarse calcification of the aortic valve. No significant pericardial effusion/thickening. Three-vessel coronary atherosclerosis. Atherosclerotic nonaneurysmal thoracic aorta. Normal caliber pulmonary arteries. No central pulmonary emboli. Mediastinum/Nodes: Subcentimeter hypodense bilateral thyroid nodules, unchanged. Not clinically significant; no follow-up imaging recommended (ref: J Am Coll Radiol. 2015 Feb;12(2): 143-50). Unremarkable esophagus. No pathologically enlarged axillary, mediastinal or hilar lymph nodes. Lungs/Pleura: No pneumothorax. No pleural effusion. Severe centrilobular emphysema with mild diffuse bronchial wall thickening. Stable curvilinear parenchymal bands in the posterior right upper lobe compatible with nonspecific postinfectious/postinflammatory scarring. Solid 0.6 cm peripheral right lower lobe pulmonary nodule (series 5/image 65), stable and considered benign. Small 0.3 cm posterior left upper lobe solid pulmonary nodule (series 5/image 57) along major fissure, decreased from 0.4 cm, considered benign. No acute consolidative airspace disease, lung masses or new significant pulmonary nodules. Musculoskeletal: No aggressive appearing focal osseous lesions. Chronic moderate T8 and T9, mild T10 and moderate T12 vertebral compression fractures. Mild thoracic spondylosis. CTA ABDOMEN AND PELVIS FINDINGS Hepatobiliary: Normal liver size. Scattered simple liver cysts, largest 1.6 cm anteriorly in the left liver lobe. A few additional scattered subcentimeter hypodense liver lesions are too small to characterize. Normal gallbladder with no radiopaque cholelithiasis. No biliary ductal dilatation. Pancreas: Normal, with no mass or significant duct dilation. Spleen: Normal size. No mass. Adrenals/Urinary Tract: Normal adrenals. No hydronephrosis. Subcentimeter hypodense medial upper left renal cortical  lesion is too small to characterize and is unchanged, considered benign. Otherwise no contour deforming renal masses. Normal bladder. Stomach/Bowel: Surgical clips surround the esophagogastric junction. Stomach is nondistended and otherwise normal. Normal caliber small bowel with no small bowel wall thickening. Appendix not discretely visualized. Mild sigmoid diverticulosis, with no large bowel wall thickening or significant pericolonic fat stranding. Vascular/Lymphatic: Atherosclerotic nonaneurysmal abdominal aorta. No pathologically enlarged lymph nodes in the abdomen or pelvis. Reproductive: Status post hysterectomy, with no abnormal findings at the vaginal cuff.  No adnexal mass. Other: No pneumoperitoneum. Trace pelvic ascites. No focal fluid collections. Musculoskeletal: No aggressive appearing focal osseous lesions. Chronic severe L1 vertebral compression fracture status post vertebroplasty. Status post bilateral posterior spinal fusion L3-L4. Bone cages in the L3-4 and L4-5 disc spaces. Marked multilevel lumbar degenerative disc disease. VASCULAR MEASUREMENTS PERTINENT TO TAVR: AORTA: Minimal Aortic Diameter-11.5 x 10.9 mm Severity of Aortic Calcification-marked RIGHT PELVIS: Right Common Iliac Artery - Minimal Diameter-6.7 x 5.0 mm Tortuosity-mild Calcification-marked Right External Iliac Artery - Minimal Diameter-4.3 x 3.9 mm Tortuosity-mild Calcification-marked Right Common Femoral Artery - Minimal Diameter-5.1 x 4.2 mm Tortuosity-mild Calcification-marked LEFT PELVIS: Left Common Iliac Artery - Minimal Diameter-7.0 x 6.5 mm Tortuosity-mild Calcification-marked Left External Iliac Artery - Minimal Diameter-5.5 x 4.4 mm Tortuosity-mild Calcification-marked Left Common Femoral Artery - Minimal Diameter-4.6 x 3.8 mm Tortuosity-mild Calcification-marked Review of the MIP images confirms the above findings. IMPRESSION: 1. Vascular findings and measurements pertinent to potential TAVR procedure, as detailed.  Note diminutive iliofemoral arteries bilaterally. 2. Diffuse thickening and coarse calcification of the aortic valve, which often correlates with aortic stenosis. 3. Three-vessel coronary atherosclerosis. 4. Severe centrilobular emphysema with mild diffuse bronchial wall thickening, suggesting COPD. 5. Trace pelvic ascites. 6. Mild sigmoid diverticulosis. 7. Multiple chronic thoracolumbar vertebral compression fractures. 8. Aortic Atherosclerosis (ICD10-I70.0) and Emphysema (ICD10-J43.9). Electronically Signed   By: Ilona Sorrel M.D.   On: 07/29/2021 11:56   CT Angio Abd/Pel w/ and/or w/o  Result Date: 07/29/2021 CLINICAL DATA:  Aortic valve replacement (TAVR), pre-op eval. EXAM: CT ANGIOGRAPHY CHEST, ABDOMEN AND PELVIS TECHNIQUE: Multidetector CT imaging through the chest, abdomen and pelvis was performed using the standard protocol during bolus administration of intravenous contrast. Multiplanar reconstructed images and MIPs were obtained and reviewed to evaluate the vascular anatomy. RADIATION DOSE REDUCTION: This exam was performed according to the departmental dose-optimization program which includes automated exposure control, adjustment of the mA and/or kV according to patient size and/or use of iterative reconstruction technique. CONTRAST:  41mL OMNIPAQUE IOHEXOL 350 MG/ML SOLN COMPARISON:  08/09/2020 chest CT. 06/23/2019 CT chest, abdomen and pelvis. FINDINGS: CTA CHEST FINDINGS Cardiovascular: Top-normal heart size. Diffuse thickening and coarse calcification of the aortic valve. No significant pericardial effusion/thickening. Three-vessel coronary atherosclerosis. Atherosclerotic nonaneurysmal thoracic aorta. Normal caliber pulmonary arteries. No central pulmonary emboli. Mediastinum/Nodes: Subcentimeter hypodense bilateral thyroid nodules, unchanged. Not clinically significant; no follow-up imaging recommended (ref: J Am Coll Radiol. 2015 Feb;12(2): 143-50). Unremarkable esophagus. No pathologically  enlarged axillary, mediastinal or hilar lymph nodes. Lungs/Pleura: No pneumothorax. No pleural effusion. Severe centrilobular emphysema with mild diffuse bronchial wall thickening. Stable curvilinear parenchymal bands in the posterior right upper lobe compatible with nonspecific postinfectious/postinflammatory scarring. Solid 0.6 cm peripheral right lower lobe pulmonary nodule (series 5/image 65), stable and considered benign. Small 0.3 cm posterior left upper lobe solid pulmonary nodule (series 5/image 57) along major fissure, decreased from 0.4 cm, considered benign. No acute consolidative airspace disease, lung masses or new significant pulmonary nodules. Musculoskeletal: No aggressive appearing focal osseous lesions. Chronic moderate T8 and T9, mild T10 and moderate T12 vertebral compression fractures. Mild thoracic spondylosis. CTA ABDOMEN AND PELVIS FINDINGS Hepatobiliary: Normal liver size. Scattered simple liver cysts, largest 1.6 cm anteriorly in the left liver lobe. A few additional scattered subcentimeter hypodense liver lesions are too small to characterize. Normal gallbladder with no radiopaque cholelithiasis. No biliary ductal dilatation. Pancreas: Normal, with no mass or significant duct dilation. Spleen: Normal size. No mass. Adrenals/Urinary Tract: Normal adrenals. No hydronephrosis. Subcentimeter hypodense medial upper left renal  cortical lesion is too small to characterize and is unchanged, considered benign. Otherwise no contour deforming renal masses. Normal bladder. Stomach/Bowel: Surgical clips surround the esophagogastric junction. Stomach is nondistended and otherwise normal. Normal caliber small bowel with no small bowel wall thickening. Appendix not discretely visualized. Mild sigmoid diverticulosis, with no large bowel wall thickening or significant pericolonic fat stranding. Vascular/Lymphatic: Atherosclerotic nonaneurysmal abdominal aorta. No pathologically enlarged lymph nodes in the  abdomen or pelvis. Reproductive: Status post hysterectomy, with no abnormal findings at the vaginal cuff. No adnexal mass. Other: No pneumoperitoneum. Trace pelvic ascites. No focal fluid collections. Musculoskeletal: No aggressive appearing focal osseous lesions. Chronic severe L1 vertebral compression fracture status post vertebroplasty. Status post bilateral posterior spinal fusion L3-L4. Bone cages in the L3-4 and L4-5 disc spaces. Marked multilevel lumbar degenerative disc disease. VASCULAR MEASUREMENTS PERTINENT TO TAVR: AORTA: Minimal Aortic Diameter-11.5 x 10.9 mm Severity of Aortic Calcification-marked RIGHT PELVIS: Right Common Iliac Artery - Minimal Diameter-6.7 x 5.0 mm Tortuosity-mild Calcification-marked Right External Iliac Artery - Minimal Diameter-4.3 x 3.9 mm Tortuosity-mild Calcification-marked Right Common Femoral Artery - Minimal Diameter-5.1 x 4.2 mm Tortuosity-mild Calcification-marked LEFT PELVIS: Left Common Iliac Artery - Minimal Diameter-7.0 x 6.5 mm Tortuosity-mild Calcification-marked Left External Iliac Artery - Minimal Diameter-5.5 x 4.4 mm Tortuosity-mild Calcification-marked Left Common Femoral Artery - Minimal Diameter-4.6 x 3.8 mm Tortuosity-mild Calcification-marked Review of the MIP images confirms the above findings. IMPRESSION: 1. Vascular findings and measurements pertinent to potential TAVR procedure, as detailed. Note diminutive iliofemoral arteries bilaterally. 2. Diffuse thickening and coarse calcification of the aortic valve, which often correlates with aortic stenosis. 3. Three-vessel coronary atherosclerosis. 4. Severe centrilobular emphysema with mild diffuse bronchial wall thickening, suggesting COPD. 5. Trace pelvic ascites. 6. Mild sigmoid diverticulosis. 7. Multiple chronic thoracolumbar vertebral compression fractures. 8. Aortic Atherosclerosis (ICD10-I70.0) and Emphysema (ICD10-J43.9). Electronically Signed   By: Ilona Sorrel M.D.   On: 07/29/2021 11:56       PFT Results Latest Ref Rng & Units 06/22/2018  FVC-Pre L 1.72  FVC-Predicted Pre % 89  FVC-Post L 1.74  FVC-Predicted Post % 89  Pre FEV1/FVC % % 54  Post FEV1/FCV % % 56  FEV1-Pre L 0.94  FEV1-Predicted Pre % 66  FEV1-Post L 0.97  DLCO uncorrected ml/min/mmHg 5.14  DLCO UNC% % 25  DLVA Predicted % 40  TLC L 4.91  TLC % Predicted % 106  RV % Predicted % 131    No results found for: NITRICOXIDE      Assessment & Plan:   COPD, severe ([place]) AECOPD resolved. Improved SOB. Suspect residual DOE r/t progression of disease and severe AS. Awaiting Brovana rx. Added to regimen to hopefully decrease the number of times she needs to use albuterol. Continue albuterol PRN. Previous walking oximetry without desaturation.   Patient Instructions  -Continue ipatropium 4 times a day -Continue budesonide Twice daily. Brush tongue and rinse mouth afterwards -Continue Albuterol inhaler 2 puffs or 3 mL neb every 6 hours as needed for shortness of breath or wheezing. Notify if symptoms persist despite rescue inhaler/neb use. -Continue carafate 10 mL four times a day with meals and at bedtime -Continue Flonase nasal spray 1-2 sprays each nostril daily -Continue Zyrtec 10 mg daily  -Continue Protonix 40 mg daily 30 minutes before breakfast  -Start Brovana neb 2 mL Twice daily once you get it.   Notify if worsening breathlessness, cough, mucus production, fatigue, or wheezing occurs.  Maintain up to date vaccinations, including influenza, COVID, and pneumococcal.  Wash  your hands often and avoid sick exposures.  Encouraged masking in crowds.  Avoid triggers, when possible.  Exercise, as tolerated. Notify if worsening symptoms upon exertion occur.    Follow up in one month with Dr. Lamonte Sakai or Alanson Aly. If symptoms do not improve or worsen, please contact office for sooner follow up or seek emergency care    URI (upper respiratory infection) Resolving with improved symptoms. Continue  flonase and zyrtec.   GE reflux Resolved GERD symptoms with PPI therapy.  Depression Understandably frustrated and depressed with activity limitations. No SI/HI. On lexapro currently. Advised to follow up with PCP if feelings persist to adjust medication.    Clayton Bibles, NP 07/31/2021  Pt aware and understands NP's role.

## 2021-07-31 NOTE — Assessment & Plan Note (Addendum)
Understandably frustrated and depressed with activity limitations. No SI/HI. On lexapro currently. Advised to follow up with PCP if feelings persist to adjust medication.

## 2021-08-05 ENCOUNTER — Institutional Professional Consult (permissible substitution) (INDEPENDENT_AMBULATORY_CARE_PROVIDER_SITE_OTHER): Payer: Medicare PPO | Admitting: Surgery

## 2021-08-05 ENCOUNTER — Encounter: Payer: Self-pay | Admitting: Surgery

## 2021-08-05 ENCOUNTER — Other Ambulatory Visit: Payer: Self-pay

## 2021-08-05 VITALS — BP 142/80 | HR 76 | Resp 20 | Ht 61.0 in | Wt 97.0 lb

## 2021-08-05 DIAGNOSIS — I350 Nonrheumatic aortic (valve) stenosis: Secondary | ICD-10-CM | POA: Diagnosis not present

## 2021-08-05 NOTE — Progress Notes (Signed)
Patient ID: Katherine [name], female   DOB: 1928-07-03, 88 y.o.   MRN: [id number]  [facility]         [address]       [place] [address]             [phone]          CARDIOTHORACIC SURGERY CONSULTATION REPORT  PCP is Baxley, Cresenciano Lick, MD Referring Provider is Lauree Chandler, MD Primary Cardiologist is Lauree Chandler, MD   Reason for consultation:  Severe aortic stenosis  HPI:  The patient is a 88 year old woman with a history of previous smoking until 2011 with COPD, hyperlipidemia, remote alcohol abuse, anemia, arthritis, IBS, and severe aortic stenosis.  She has been followed by Dr. Renold Genta with moderate aortic stenosis.  A 2D echocardiogram in January 2019 showed a trileaflet aortic valve with moderate thickening and calcification with a mean gradient of 27 mmHg and a peak gradient of 43 mmHg.  Valve area by VTI was 1.44 cm.  She has had a several year history of exertional fatigue and shortness of breath which was felt to be due to COPD.  She has been treated with bronchodilators but they do not seem to help that much.  She was being evaluated for hand surgery and the heart murmur was noted and she required a cardiac evaluation.  She had a repeat echocardiogram on 05/21/2021.  This showed a severely calcified aortic valve with a mean gradient that increased to 33.7 mmHg with a peak gradient of 57 mmHg.  Aortic valve area was 0.62 cm by VTI.  Dimensionless index was 0.2.  She underwent cardiac catheterization on 07/11/2021 showing mild nonobstructive coronary disease.  PA pressures were normal.  The patient is here today with her husband.  She has been very limited in her activity and it seems to be getting worse.  She uses a rolling walker at all times for stability.  She reports exertional fatigue and shortness of breath with minimal activity.  She has had no symptoms at rest.  She denies any  dizziness or syncope.  She has had no orthopnea or peripheral edema.  She denies any chest pain or pressure.   Past Medical History:  Diagnosis Date   Alcoholic ([place])    36 years sober   Allergy    Anemia    Aortic stenosis    Arthritis    Asthma    patient denies   B12 deficiency    COPD (chronic obstructive pulmonary disease) ([place])    Depression    not recent   Diverticulosis    Esophageal stricture    Headache    cluster headaches occasionally   Hepatic cyst 01/30/2010   Hiatal hernia 1985   patient unaware   Hx of adenomatous colonic polyps 2006   Hyperlipidemia    IBS (irritable bowel syndrome)    PONV (postoperative nausea and vomiting)    no nausea or vomitting after surgery 07/2014   Positive PPD    PUD (peptic ulcer disease)    Shortness of breath dyspnea    SOB WITH EXERTION   (NOT NEW)    Past Surgical History:  Procedure Laterality Date   ABDOMINAL HYSTERECTOMY     abnormal bleeding   APPENDECTOMY     BARTHOLIN GLAND CYST EXCISION     x2   BREAST BIOPSY Bilateral    Milk glnd   CATARACT EXTRACTION W/ INTRAOCULAR LENS  IMPLANT, BILATERAL Bilateral    COLONOSCOPY     COLONOSCOPY W/ POLYPECTOMY     HARDWARE REMOVAL N/A 12/13/2015   Procedure: Removal of HARDWARE  Lumbar Five-Sacral One ;  Surgeon: Kary Kos, MD;  Location: [facility];  Service: Neurosurgery;  Laterality: N/A;   IR KYPHO LUMBAR INC FX REDUCE BONE BX UNI/BIL CANNULATION INC/IMAGING  07/13/2019   RIGHT/LEFT HEART CATH AND CORONARY ANGIOGRAPHY N/A 07/11/2021   Procedure: RIGHT/LEFT HEART CATH AND CORONARY ANGIOGRAPHY;  Surgeon: Burnell Blanks, MD;  Location: [facility];  Service: Cardiovascular;  Laterality: N/A;   UPPER GI ENDOSCOPY     VAGOTOMY AND PYLOROPLASTY     in [place], [place]    Family History  Problem Relation Age of Onset   Ulcers Father    Stroke Father    Irritable bowel syndrome Father    Ovarian cancer Sister    Colon cancer Neg Hx     Social  History   Socioeconomic History   Marital status: Married    Spouse name: Not on file   Number of children: 2   Years of education: Not on file   Highest education level: Not on file  Occupational History    Employer: RETIRED   Occupation: Retired-office work  Tobacco Use   Smoking status: Former    Years: 60.00    Types: Cigarettes    Quit date: 02/02/2010    Years since quitting: 11.5   Smokeless tobacco: Never  Vaping Use   Vaping Use: Never used  Substance and Sexual Activity   Alcohol use: No    Comment: recovering alcoholic     40 YRS   Drug use: No   Sexual activity: Not on file  Other Topics Concern   Not on file  Social History Narrative   Daily caffeine    Social Determinants of Health   Financial Resource Strain: Not on file  Food Insecurity: Not on file  Transportation Needs: Not on file  Physical Activity: Not on file  Stress: Not on file  Social Connections: Not on file  Intimate Partner Violence: Not on file    Prior to Admission medications   Medication Sig Start Date End Date Taking? Authorizing Provider  albuterol (VENTOLIN HFA) 108 (90 Base) MCG/ACT inhaler INHALE 2 PUFFS INTO THE LUNGS 4 (FOUR) TIMES DAILY AS NEEDED FOR WHEEZING OR SHORTNESS OF BREATH. 07/10/21  Yes Byrum, Rose Fillers, MD  ALPRAZolam (XANAX) 0.25 MG tablet TAKE ONE TAB UP TO TWICE DAILY AS NEEDED FOR ANXIETY AND SLEEP Patient taking differently: Take 0.25 mg by mouth at bedtime. 06/03/21  Yes Baxley, Cresenciano Lick, MD  arformoterol (BROVANA) 15 MCG/2ML NEBU Take 2 mLs (15 mcg total) by nebulization 2 (two) times daily. 07/24/21  Yes Cobb, Karie Schwalbe, NP  budesonide (PULMICORT) 0.5 MG/2ML nebulizer solution Take 0.5 mg by nebulization daily as needed (shortness of breath).   Yes [provider]  Cyanocobalamin (VITAMIN B-12 PO) Take 1 tablet by mouth daily.   Yes [provider]  escitalopram (LEXAPRO) 5 MG tablet TAKE 1 TABLET BY MOUTH EVERY DAY 10/21/20  Yes Baxley, Cresenciano Lick, MD   ferrous sulfate 325 (65 FE) MG EC tablet Take 1 tablet by mouth once every other day with a glass of orange juice. 01/22/21  Yes Dede Query T, PA-C  ipratropium (ATROVENT) 0.02 % nebulizer solution TAKE 2.5 MLS (0.5 MG TOTAL) BY NEBULIZATION 4 (FOUR) TIMES DAILY. 02/09/21  Yes Baxley, Cresenciano Lick, MD  Melatonin  10 MG CAPS Take 10 mg by mouth daily.   Yes [provider]  Multiple Vitamins-Minerals (MULTIVITAMIN WITH MINERALS) tablet Take 1 tablet by mouth daily.   Yes [provider]  pantoprazole (PROTONIX) 40 MG tablet Take 1 tablet (40 mg total) by mouth daily. 07/24/21  Yes Cobb, Karie Schwalbe, NP  Polysaccharide Iron Complex (POLYSACCHARIDE IRON PO) Take 1 capsule by mouth daily.   Yes [provider]  traMADol (ULTRAM) 50 MG tablet Take 50 mg by mouth every 6 (six) hours as needed for moderate pain.  07/18/19  Yes [provider]  traZODone (DESYREL) 50 MG tablet TAKE 3 TABLETS BY MOUTH AT BEDTIME Patient taking differently: Take 100 mg by mouth at bedtime. 06/21/21  Yes Baxley, Cresenciano Lick, MD  fluticasone (FLONASE) 50 MCG/ACT nasal spray Place 1 spray into both nostrils daily. Patient not taking: Reported on 08/05/2021 07/24/21   Clayton Bibles, NP  sucralfate (CARAFATE) 1 GM/10ML suspension Take 10 mLs (1 g total) by mouth 4 (four) times daily -  with meals and at bedtime. Patient not taking: Reported on 08/05/2021 07/19/21   Malvin Johns, MD    Current Outpatient Medications  Medication Sig Dispense Refill   albuterol (VENTOLIN HFA) 108 (90 Base) MCG/ACT inhaler INHALE 2 PUFFS INTO THE LUNGS 4 (FOUR) TIMES DAILY AS NEEDED FOR WHEEZING OR SHORTNESS OF BREATH. 36 each 6   ALPRAZolam (XANAX) 0.25 MG tablet TAKE ONE TAB UP TO TWICE DAILY AS NEEDED FOR ANXIETY AND SLEEP (Patient taking differently: Take 0.25 mg by mouth at bedtime.) 60 tablet 5   arformoterol (BROVANA) 15 MCG/2ML NEBU Take 2 mLs (15 mcg total) by nebulization 2 (two) times daily. 120 mL 2   budesonide  (PULMICORT) 0.5 MG/2ML nebulizer solution Take 0.5 mg by nebulization daily as needed (shortness of breath).     Cyanocobalamin (VITAMIN B-12 PO) Take 1 tablet by mouth daily.     escitalopram (LEXAPRO) 5 MG tablet TAKE 1 TABLET BY MOUTH EVERY DAY 90 tablet 2   ferrous sulfate 325 (65 FE) MG EC tablet Take 1 tablet by mouth once every other day with a glass of orange juice. 30 tablet 3   ipratropium (ATROVENT) 0.02 % nebulizer solution TAKE 2.5 MLS (0.5 MG TOTAL) BY NEBULIZATION 4 (FOUR) TIMES DAILY. 62.5 mL 12   Melatonin 10 MG CAPS Take 10 mg by mouth daily.     Multiple Vitamins-Minerals (MULTIVITAMIN WITH MINERALS) tablet Take 1 tablet by mouth daily.     pantoprazole (PROTONIX) 40 MG tablet Take 1 tablet (40 mg total) by mouth daily. 30 tablet 2   Polysaccharide Iron Complex (POLYSACCHARIDE IRON PO) Take 1 capsule by mouth daily.     traMADol (ULTRAM) 50 MG tablet Take 50 mg by mouth every 6 (six) hours as needed for moderate pain.      traZODone (DESYREL) 50 MG tablet TAKE 3 TABLETS BY MOUTH AT BEDTIME (Patient taking differently: Take 100 mg by mouth at bedtime.) 270 tablet 0   fluticasone (FLONASE) 50 MCG/ACT nasal spray Place 1 spray into both nostrils daily. (Patient not taking: Reported on 08/05/2021) 18.2 mL 2   sucralfate (CARAFATE) 1 GM/10ML suspension Take 10 mLs (1 g total) by mouth 4 (four) times daily -  with meals and at bedtime. (Patient not taking: Reported on 08/05/2021) 420 mL 0   No current facility-administered medications for this visit.    Allergies  Allergen Reactions   Aspirin Other (See Comments)    Hx of ulcers,  stomach issues       Review of Systems:   General:  + decreased appetite, +decreased energy, + weight gain, + weight loss, no fever  Cardiac:  no chest pain with exertion, no chest pain at rest, +SOB with mild exertion, no resting SOB, no PND, no orthopnea, no palpitations, no arrhythmia, no atrial fibrillation, no LE edema, no dizzy spells, no  syncope  Respiratory:  + exertional shortness of breath, no home oxygen, no productive cough, no dry cough, no bronchitis, no wheezing, no hemoptysis, no asthma, no pain with inspiration or cough, no sleep apnea, no CPAP at night  GI:   no difficulty swallowing, no reflux, no frequent heartburn, no hiatal hernia, no abdominal pain, no constipation, no diarrhea, no hematochezia, no hematemesis, no melena  GU:   no dysuria,  no frequency, no urinary tract infection, no hematuria, no kidney stones, no kidney disease  Vascular:  no pain suggestive of claudication, no pain in feet, no leg cramps, no varicose veins, no DVT, no non-healing foot ulcer  Neuro:   no stroke, no TIA's, no seizures, no headaches, no temporary blindness one eye,  no slurred speech, no peripheral neuropathy, no chronic pain, + instability of gait, +memory/cognitive dysfunction  Musculoskeletal: + arthritis, no joint swelling, no myalgias, + difficulty walking, + decreased mobility   Skin:   no rash, no itching, no skin infections, no pressure sores or ulcerations  Psych:   + anxiety, + depression, + nervousness, + unusual recent stress  Eyes:   no blurry vision, no floaters, no recent vision changes, + wears glasses  ENT:   no hearing loss, no loose or painful teeth, + partial dentures, no dental problems  Hematologic:  + easy bruising, no abnormal bleeding, no clotting disorder, no frequent epistaxis  Endocrine:  no diabetes, does not check CBG's at home     Physical Exam:   BP (!) 142/80 (BP Location: Right Arm, Patient Position: Sitting)    Pulse 76    Resp 20    Ht 5\' 1"  (1.549 m)    Wt 97 lb (44 kg)    SpO2 94% Comment: RA   BMI 18.33 kg/m   General:  frail-appearing  HEENT:  Unremarkable, NCAT, PERLA, EOMI  Neck:   no JVD, no bruits, no adenopathy   Chest:   clear to auscultation, symmetrical breath sounds, no wheezes, no rhonchi   CV:   RRR, 3/6 systolic murmur RSB, no diastolic murmur  Abdomen:  soft, non-tender, no  masses   Extremities:  warm, well-perfused, pulses palpable at ankle, no lower extremity edema  Rectal/GU  Deferred  Neuro:   Grossly non-focal and symmetrical throughout  Skin:   Clean and dry, no rashes, no breakdown  Diagnostic Tests:    ECHOCARDIOGRAM REPORT         Patient Name:   Katherine [name] Date of Exam: 05/21/2021  Medical Rec #:  [id number]         Height:       60.0 in  Accession #:    [id number]        Weight:       93.0 lb  Date of Birth:  12/16/1928          BSA:          1.348 m  Patient Age:    20 years          BP:           118/78 mmHg  Patient Gender: F                 HR:           78 bpm.  Exam Location:  Outpatient   Procedure: 2D Echo and 3D Echo   Indications:     Pre-op evaluation     History:         Patient has prior history of Echocardiogram examinations,  most                   recent 07/16/2017. COPD; Risk Factors:Dyslipidemia.     Sonographer:     Mikki Santee RDCS  Referring Phys:  [place]  Diagnosing Phys: Oswaldo Milian MD   IMPRESSIONS     1. Left ventricular ejection fraction, by estimation, is 60 to 65%. The  left ventricle has normal function. The left ventricle has no regional  wall motion abnormalities. There is mild left ventricular hypertrophy.  Left ventricular diastolic parameters  are consistent with Grade II diastolic dysfunction (pseudonormalization).  Elevated left atrial pressure.   2. Right ventricular systolic function is normal. The right ventricular  size is normal. Tricuspid regurgitation signal is inadequate for assessing  PA pressure.   3. The mitral valve is degenerative. Trivial mitral valve regurgitation.  Mild mitral stenosis. MG 62mmHg at 72bpm, MVA 1.6 cm^2 by continuity  equation. Moderate mitral annular calcification.   4. The inferior vena cava is normal in size with greater than 50%  respiratory variability, suggesting right atrial pressure of 3 mmHg.   5. The aortic valve is  calcified. There is severe calcifcation of the  aortic valve. Aortic valve regurgitation is not visualized. Severe aortic  valve stenosis. Vmax 3.9 m/s, MG 34 mmHg, AVA 0.6 cm^2, DI 0.19   FINDINGS   Left Ventricle: Left ventricular ejection fraction, by estimation, is 60  to 65%. The left ventricle has normal function. The left ventricle has no  regional wall motion abnormalities. 3D left ventricular ejection fraction  analysis performed but not  reported based on interpreter judgement due to suboptimal tracking. The  left ventricular internal cavity size was small. There is mild left  ventricular hypertrophy. Left ventricular diastolic parameters are  consistent with Grade II diastolic dysfunction  (pseudonormalization). Elevated left atrial pressure.   Right Ventricle: The right ventricular size is normal. No increase in  right ventricular wall thickness. Right ventricular systolic function is  normal. Tricuspid regurgitation signal is inadequate for assessing PA  pressure.   Left Atrium: Left atrial size was normal in size.   Right Atrium: Right atrial size was normal in size.   Pericardium: Trivial pericardial effusion is present.   Mitral Valve: The mitral valve is degenerative in appearance. Moderate  mitral annular calcification. Trivial mitral valve regurgitation. Mild  mitral valve stenosis.   Tricuspid Valve: The tricuspid valve is normal in structure. Tricuspid  valve regurgitation is trivial.   Aortic Valve: The aortic valve is calcified. There is severe calcifcation  of the aortic valve. Aortic valve regurgitation is not visualized. Severe  aortic stenosis is present. Aortic valve mean gradient measures 33.7 mmHg.  Aortic valve peak gradient  measures 57.0 mmHg. Aortic valve area, by VTI measures 0.62 cm.   Pulmonic Valve: The pulmonic valve was not well visualized. Pulmonic valve  regurgitation is not visualized.   Aorta: The aortic root and ascending aorta  are structurally normal, with  no evidence of dilitation.   Venous: The inferior vena cava  is normal in size with greater than 50%  respiratory variability, suggesting right atrial pressure of 3 mmHg.   IAS/Shunts: The interatrial septum was not well visualized.      LEFT VENTRICLE  PLAX 2D  LVIDd:         3.70 cm   Diastology  LVIDs:         2.30 cm   LV e' medial:    4.28 cm/s  LV PW:         0.80 cm   LV E/e' medial:  23.4  LV IVS:        0.70 cm   LV e' lateral:   4.20 cm/s  LVOT diam:     2.00 cm   LV E/e' lateral: 23.8  LV SV:         58  LV SV Index:   43  LVOT Area:     3.14 cm                              3D Volume EF:                           3D EF:        53 %                           LV EDV:       99 ml                           LV ESV:       46 ml                           LV SV:        53 ml   RIGHT VENTRICLE  RV S prime:     11.10 cm/s  TAPSE (M-mode): 1.8 cm   LEFT ATRIUM             Index        RIGHT ATRIUM           Index  LA diam:        2.00 cm 1.48 cm/m   RA Area:     11.30 cm  LA Vol (A2C):   37.0 ml 27.45 ml/m  RA Volume:   24.20 ml  17.95 ml/m  LA Vol (A4C):   29.7 ml 22.03 ml/m  LA Biplane Vol: 34.8 ml 25.81 ml/m   AORTIC VALVE  AV Area (Vmax):    0.63 cm  AV Area (Vmean):   0.59 cm  AV Area (VTI):     0.62 cm  AV Vmax:           377.33 cm/s  AV Vmean:          275.000 cm/s  AV VTI:            0.946 m  AV Peak Grad:      57.0 mmHg  AV Mean Grad:      33.7 mmHg  LVOT Vmax:         75.30 cm/s  LVOT Vmean:        51.400 cm/s  LVOT VTI:          0.186 m  LVOT/AV VTI ratio: 0.20  AORTA  Ao Root diam: 2.80 cm   MITRAL VALVE  MV Area (PHT): 3.37 cm     SHUNTS  MV Decel Time: 225 msec     Systemic VTI:  0.19 m  MV E velocity: 100.00 cm/s  Systemic Diam: 2.00 cm  MV A velocity: 154.00 cm/s  MV E/A ratio:  0.65   Oswaldo Milian MD  Electronically signed by Oswaldo Milian MD  Signature Date/Time: 05/21/2021/4:05:55  PM         Final (Updated)    Physicians  Panel Physicians Referring Physician Case Authorizing Physician  Burnell Blanks, MD (Primary)     Procedures  RIGHT/LEFT HEART CATH AND CORONARY ANGIOGRAPHY   Conclusion      Ost Cx to Prox Cx lesion is 30% stenosed.   Prox RCA lesion is 30% stenosed.   Mild non-obstructive CAD Severe aortic stenosis by echo. I did not cross the aortic valve today.  RA 2 RV 36/0/4 PA 38/13 mean 24 PCWP 11 AO 145/57   Recommendations: Continue workup for TAVR   Indications  Severe aortic stenosis [I35.0 (ICD-10-CM)]   Procedural Details  Technical Details Indication: Severe aortic stenosis, workup for TAVR  Procedure: The risks, benefits, complications, treatment options, and expected outcomes were discussed with the patient. The patient and/or family concurred with the proposed plan, giving informed consent. The patient was brought to the cath lab after IV hydration was given. The patient was sedated with Versed and Fentanyl. The IV catheter in the right antecubital vein was changed for a 5 [place] sheath. Right heart catheterization performed with a balloon tipped catheter. The right groin was prepped and draped in the usual manner. Using the modified Seldinger access technique, a 5 French sheath was placed in the right femoral artery using u/s guidance. Standard diagnostic catheters were used to perform selective coronary angiography. I did not cross the aortic valve. Mynx closure device placed in right femoral artery.   There were no immediate complications. The patient was taken to the recovery area in stable condition.     Estimated blood loss <50 mL.   During this procedure medications were administered to achieve and maintain moderate conscious sedation while the patient's heart rate, blood pressure, and oxygen saturation were continuously monitored and I was present face-to-face 100% of this time.   Medications (Filter:  Administrations occurring from 0919 to 0959 on 07/11/21)  important  Continuous medications are totaled by the amount administered until 07/11/21 0959.   fentaNYL (SUBLIMAZE) injection (mcg) Total dose:  25 mcg Date/Time Rate/Dose/Volume Action   07/11/21 0924 25 mcg Given    midazolam (VERSED) injection (mg) Total dose:  1 mg Date/Time Rate/Dose/Volume Action   07/11/21 0924 1 mg Given    Heparin (Porcine) in NaCl 1000-0.9 UT/500ML-% SOLN (mL) Total volume:  1,000 mL Date/Time Rate/Dose/Volume Action   07/11/21 0925 500 mL Given   0925 500 mL Given    lidocaine (PF) (XYLOCAINE) 1 % injection (mL) Total volume:  22 mL Date/Time Rate/Dose/Volume Action   07/11/21 0928 2 mL Given   0939 20 mL Given    iohexol (OMNIPAQUE) 350 MG/ML injection (mL) Total volume:  50 mL Date/Time Rate/Dose/Volume Action   07/11/21 0958 50 mL Given    Sedation Time  Sedation Time Physician-1: 25 minutes 11 seconds Contrast  Medication Name Total Dose  iohexol (OMNIPAQUE) 350 MG/ML injection 50 mL   Radiation/Fluoro  Fluoro time: 3.5 (min) DAP: 4003 (mGycm2) Cumulative Air Kerma: 79 (mGy) Complications  Complications  documented before study signed (07/11/2021 38:18 AM)   No complications were associated with this study.  Documented by Jamie Kato - 07/11/2021  9:47 AM     Coronary Findings  Diagnostic Dominance: Left Left Circumflex  Ost Cx to Prox Cx lesion is 30% stenosed.    Right Coronary Artery  Vessel is small.  Prox RCA lesion is 30% stenosed.    Intervention   No interventions have been documented.   Coronary Diagrams  Diagnostic Dominance: Left Intervention  Implants     Vascular Products  Closure Mynx Control 67f - [id number] - Implanted Inventory item: CLOSURE [facility] CONTROL 15F Model/Cat number: [id number]  Manufacturer: [employer] Lot number: [id number]  Device identifier: [id number] Device identifier type: GS1  GUDID Information  Request  status Successful    Brand name: MYNX CONTROL Version/Model: [id number]  Company name: [employer]. MRI safety info as of 07/11/21: MR Safe  Contains dry or latex rubber: No    GMDN P.T. name: Wound hydrogel dressing, non-antimicrobial     As of 07/11/2021  Status: Implanted       Syngo Images   Show images for CARDIAC CATHETERIZATION Images on Long Term Storage   Show images for Katherine [name], Katherine [name] to Procedure Log  Procedure Log    Hemo Data  Flowsheet Row Most Recent Value  Fick Cardiac Output 4.67 L/min  Fick Cardiac Output Index 3.43 (L/min)/BSA  RA A Wave 6 mmHg  RA V Wave 2 mmHg  RA Mean 1 mmHg  RV Systolic Pressure 36 mmHg  RV Diastolic Pressure 0 mmHg  RV EDP 4 mmHg  PA Systolic Pressure 38 mmHg  PA Diastolic Pressure 13 mmHg  PA Mean 24 mmHg  PW A Wave 14 mmHg  PW V Wave 11 mmHg  PW Mean 11 mmHg  AO Systolic Pressure 008 mmHg  AO Diastolic Pressure 55 mmHg  AO Mean 94 mmHg  QP/QS 1  TPVR Index 7 HRUI  TSVR Index 27.13 HRUI  PVR SVR Ratio 0.14  TPVR/TSVR Ratio 0.26    ADDENDUM REPORT: 07/29/2021 12:17   CLINICAL DATA:  Aortic stenosis   EXAM: Cardiac TAVR CT   TECHNIQUE: The patient was scanned on a Siemens Force 676 slice scanner. A 120 kV retrospective scan was triggered in the descending thoracic aorta at 111 HU's. Gantry rotation speed was 270 msecs and collimation was .9 mm. No beta blockade or nitro were given. The 3D data set was reconstructed in 5% intervals of the R-R cycle. Systolic and diastolic phases were analyzed on a dedicated work station using MPR, MIP and VRT modes. The patient received 80 cc of contrast.   FINDINGS: Aortic Valve: Tri leaflet calcified with restricted leaflet motion Functionally bi cuspid with partial fusion of left and right cusps   Aorta: No aneurysm normal arch vessels moderate calcific atherosclerosis   Sinotubular Junction: 25 mm   Ascending Thoracic Aorta: 30 mm   Aortic Arch: 24 mm    Descending Thoracic Aorta: 23 mm   Sinus of Valsalva Measurements:   Non-coronary: 27.7 mm   Right - coronary: 24.2 mm   Left - coronary: 27.2 mm   Coronary Artery Height above Annulus:   Left Main: 12.5 mm above annulus   Right Coronary: 12.8 mm above annulus   Virtual Basal Annulus Measurements:   Maximum/Minimum Diameter: 23.04 mm x 19.4 mm   Perimeter: 69 mm   Area: 359 mm2   Coronary Arteries: Sufficient height above annulus  for deployment   Optimum Fluoroscopic Angle for Delivery: LAO 7 Caudal 13 degrees   IMPRESSION: 1. Functionally bi cuspid AV with annular area 359 mm2 suitable for a 23 mm Sapien 3 valve   2.  Coronary arteries sufficient height above annulus for deployment   3.  Membranous septal length 9.6 mm   4.  AV calcium score 2142   5. Optimum angiographic angle for deployment LAO 7 Caudal 13 degrees   Jenkins Rouge     Electronically Signed   By: Jenkins Rouge M.D.   On: 07/29/2021 12:17    Narrative & Impression  CLINICAL DATA:  Aortic valve replacement (TAVR), pre-op eval.   EXAM: CT ANGIOGRAPHY CHEST, ABDOMEN AND PELVIS   TECHNIQUE: Multidetector CT imaging through the chest, abdomen and pelvis was performed using the standard protocol during bolus administration of intravenous contrast. Multiplanar reconstructed images and MIPs were obtained and reviewed to evaluate the vascular anatomy.   RADIATION DOSE REDUCTION: This exam was performed according to the departmental dose-optimization program which includes automated exposure control, adjustment of the mA and/or kV according to patient size and/or use of iterative reconstruction technique.   CONTRAST:  88mL OMNIPAQUE IOHEXOL 350 MG/ML SOLN   COMPARISON:  08/09/2020 chest CT. 06/23/2019 CT chest, abdomen and pelvis.   FINDINGS: CTA CHEST FINDINGS   Cardiovascular: Top-normal heart size. Diffuse thickening and coarse calcification of the aortic valve. No significant  pericardial effusion/thickening. Three-vessel coronary atherosclerosis. Atherosclerotic nonaneurysmal thoracic aorta. Normal caliber pulmonary arteries. No central pulmonary emboli.   Mediastinum/Nodes: Subcentimeter hypodense bilateral thyroid nodules, unchanged. Not clinically significant; no follow-up imaging recommended (ref: J Am Coll Radiol. 2015 Feb;12(2): 143-50). Unremarkable esophagus. No pathologically enlarged axillary, mediastinal or hilar lymph nodes.   Lungs/Pleura: No pneumothorax. No pleural effusion. Severe centrilobular emphysema with mild diffuse bronchial wall thickening. Stable curvilinear parenchymal bands in the posterior right upper lobe compatible with nonspecific postinfectious/postinflammatory scarring. Solid 0.6 cm peripheral right lower lobe pulmonary nodule (series 5/image 65), stable and considered benign. Small 0.3 cm posterior left upper lobe solid pulmonary nodule (series 5/image 57) along major fissure, decreased from 0.4 cm, considered benign. No acute consolidative airspace disease, lung masses or new significant pulmonary nodules.   Musculoskeletal: No aggressive appearing focal osseous lesions. Chronic moderate T8 and T9, mild T10 and moderate T12 vertebral compression fractures. Mild thoracic spondylosis.   CTA ABDOMEN AND PELVIS FINDINGS   Hepatobiliary: Normal liver size. Scattered simple liver cysts, largest 1.6 cm anteriorly in the left liver lobe. A few additional scattered subcentimeter hypodense liver lesions are too small to characterize. Normal gallbladder with no radiopaque cholelithiasis. No biliary ductal dilatation.   Pancreas: Normal, with no mass or significant duct dilation.   Spleen: Normal size. No mass.   Adrenals/Urinary Tract: Normal adrenals. No hydronephrosis. Subcentimeter hypodense medial upper left renal cortical lesion is too small to characterize and is unchanged, considered benign. Otherwise no contour  deforming renal masses. Normal bladder.   Stomach/Bowel: Surgical clips surround the esophagogastric junction. Stomach is nondistended and otherwise normal. Normal caliber small bowel with no small bowel wall thickening. Appendix not discretely visualized. Mild sigmoid diverticulosis, with no large bowel wall thickening or significant pericolonic fat stranding.   Vascular/Lymphatic: Atherosclerotic nonaneurysmal abdominal aorta. No pathologically enlarged lymph nodes in the abdomen or pelvis.   Reproductive: Status post hysterectomy, with no abnormal findings at the vaginal cuff. No adnexal mass.   Other: No pneumoperitoneum. Trace pelvic ascites. No focal fluid collections.   Musculoskeletal: No aggressive  appearing focal osseous lesions. Chronic severe L1 vertebral compression fracture status post vertebroplasty. Status post bilateral posterior spinal fusion L3-L4. Bone cages in the L3-4 and L4-5 disc spaces. Marked multilevel lumbar degenerative disc disease.   VASCULAR MEASUREMENTS PERTINENT TO TAVR:   AORTA:   Minimal Aortic Diameter-11.5 x 10.9 mm   Severity of Aortic Calcification-marked   RIGHT PELVIS:   Right Common Iliac Artery -   Minimal Diameter-6.7 x 5.0 mm   Tortuosity-mild   Calcification-marked   Right External Iliac Artery -   Minimal Diameter-4.3 x 3.9 mm   Tortuosity-mild   Calcification-marked   Right Common Femoral Artery -   Minimal Diameter-5.1 x 4.2 mm   Tortuosity-mild   Calcification-marked   LEFT PELVIS:   Left Common Iliac Artery -   Minimal Diameter-7.0 x 6.5 mm   Tortuosity-mild   Calcification-marked   Left External Iliac Artery -   Minimal Diameter-5.5 x 4.4 mm   Tortuosity-mild   Calcification-marked   Left Common Femoral Artery -   Minimal Diameter-4.6 x 3.8 mm   Tortuosity-mild   Calcification-marked   Review of the MIP images confirms the above findings.   IMPRESSION: 1. Vascular findings and  measurements pertinent to potential TAVR procedure, as detailed. Note diminutive iliofemoral arteries bilaterally. 2. Diffuse thickening and coarse calcification of the aortic valve, which often correlates with aortic stenosis. 3. Three-vessel coronary atherosclerosis. 4. Severe centrilobular emphysema with mild diffuse bronchial wall thickening, suggesting COPD. 5. Trace pelvic ascites. 6. Mild sigmoid diverticulosis. 7. Multiple chronic thoracolumbar vertebral compression fractures. 8. Aortic Atherosclerosis (ICD10-I70.0) and Emphysema (ICD10-J43.9).     Electronically Signed   By: Ilona Sorrel M.D.   On: 07/29/2021 11:56   Impression:  This 88 year old woman has stage D, severe, symptomatic aortic stenosis with New York Heart Association class III symptoms of exertional fatigue and shortness of breath consistent with chronic diastolic congestive heart failure.  She also has a history of remote smoking and COPD which contributes to her shortness of breath but her symptoms have been worsening over the last couple years and are likely worsening due to her aortic stenosis.  I have personally reviewed her 2D echocardiogram, cardiac catheterization, and CTA studies.  Her echo shows a heavily calcified and thickened aortic valve with restricted leaflet mobility.  It looks like a bicuspid valve to me.  The mean gradient was 34 mmHg with a valve area of 0.62 cm consistent with severe aortic stenosis.  Left ventricular ejection fraction is 60 to 65% with grade 2 diastolic dysfunction.  There is trivial mitral regurgitation.  Cardiac catheterization shows mild nonobstructive coronary disease with normal PA pressures.  Given her advanced age and general frailty I do not think she is a candidate for open surgical aortic valve replacement but transcatheter aortic valve replacement is a reasonable option.  She is still living independently at home with her husband and would like to be more active but is  limited due to her exertional fatigue and shortness of breath.  Her gated cardiac CTA shows anatomy suitable for TAVR using a SAPIEN 3 valve.  Her abdominal and pelvic CTA shows relatively small iliac and femoral vessels with moderate calcified atherosclerosis that may be too small to allow transfemoral insertion.  She does appear to have suitable right common carotid artery access.  Left subclavian and left carotid access are not possible due to size and atherosclerosis.  The patient and her husband were counseled at length regarding treatment alternatives for management of severe  symptomatic aortic stenosis. The risks and benefits of surgical intervention has been discussed in detail. Long-term prognosis with medical therapy was discussed. Alternative approaches such as conventional surgical aortic valve replacement, transcatheter aortic valve replacement, and palliative medical therapy were compared and contrasted at length. This discussion was placed in the context of the patient's own specific clinical presentation and past medical history. All of their questions have been addressed.   Following the decision to proceed with transcatheter aortic valve replacement, a discussion was held regarding what types of management strategies would be attempted intraoperatively in the event of life-threatening complications, including whether or not the patient would be considered a candidate for the use of cardiopulmonary bypass and/or conversion to open sternotomy for attempted surgical intervention.  I do not think she is a candidate for emergent sternotomy or use of cardiopulmonary bypass to manage any intraoperative complications given her advanced age and frailty.  The patient has been advised of a variety of complications that might develop including but not limited to risks of death, stroke, paravalvular leak, aortic dissection or other major vascular complications, aortic annulus rupture, device embolization,  cardiac rupture or perforation, mitral regurgitation, acute myocardial infarction, arrhythmia, heart block or bradycardia requiring permanent pacemaker placement, congestive heart failure, respiratory failure, renal failure, pneumonia, infection, other late complications related to structural valve deterioration or migration, or other complications that might ultimately cause a temporary or permanent loss of functional independence or other long term morbidity. The patient provides full informed consent for the procedure as described and all questions were answered.      Plan:  She will be scheduled for TAVR using a SAPIEN 3 valve most likely through a right common carotid artery approach.  I spent 60 minutes performing this consultation and > 50% of this time was spent face to face counseling and coordinating the care of this patient's severe symptomatic aortic stenosis   Gaye Pollack, MD 08/05/2021 4:56 PM

## 2021-08-06 ENCOUNTER — Other Ambulatory Visit: Payer: Self-pay

## 2021-08-06 DIAGNOSIS — I350 Nonrheumatic aortic (valve) stenosis: Secondary | ICD-10-CM

## 2021-08-08 NOTE — Pre-Procedure Instructions (Signed)
Surgical Instructions    Your procedure is scheduled on Tuesday, August 13, 2021 at 7:30 AM.  Report to [facility] Main Entrance "A" at 5:30 A.M., then check in with the Admitting office.  Call this number if you have problems the morning of surgery:  [phone]   If you have any questions prior to your surgery date call [phone]: Open Monday-Friday 8am-4pm    Remember:  Do not eat or drink after midnight the night before your surgery  Continue taking all medications without change through the day before surgery. On the morning of surgery do not take any medications.  Please bring all inhalers with you the day of surgery.   As of today, STOP taking any Aspirin (unless otherwise instructed by your surgeon) Aleve, Naproxen, Ibuprofen, Motrin, Advil, Goody's, BC's, all herbal medications, fish oil, and all vitamins.                     Do NOT Smoke (Tobacco/Vaping) for 24 hours prior to your procedure.  If you use a CPAP at night, you may bring your mask/headgear for your overnight stay.   Contacts, glasses, piercing's, hearing aid's, dentures or partials may not be worn into surgery, please bring cases for these belongings.    For patients admitted to the hospital, discharge time will be determined by your treatment team.   Patients discharged the day of surgery will not be allowed to drive home, and someone needs to stay with them for 24 hours.  NO VISITORS WILL BE ALLOWED IN PRE-OP WHERE PATIENTS ARE PREPPED FOR SURGERY.  ONLY 1 SUPPORT PERSON MAY BE PRESENT IN THE WAITING ROOM WHILE YOU ARE IN SURGERY.  IF YOU ARE TO BE ADMITTED, ONCE YOU ARE IN YOUR ROOM YOU WILL BE ALLOWED TWO (2) VISITORS. (1) VISITOR MAY STAY OVERNIGHT BUT MUST ARRIVE TO THE ROOM BY 8pm.  Minor children may have two parents present. Special consideration for safety and communication needs will be reviewed on a case by case basis.   Special instructions:   [facility]- Preparing For Surgery  Before  surgery, you can play an important role. Because skin is not sterile, your skin needs to be as free of germs as possible. You can reduce the number of germs on your skin by washing with CHG (chlorahexidine gluconate) Soap before surgery.  CHG is an antiseptic cleaner which kills germs and bonds with the skin to continue killing germs even after washing.    Oral Hygiene is also important to reduce your risk of infection.  Remember - BRUSH YOUR TEETH THE MORNING OF SURGERY WITH YOUR REGULAR TOOTHPASTE  Please do not use if you have an allergy to CHG or antibacterial soaps. If your skin becomes reddened/irritated stop using the CHG.  Do not shave (including legs and underarms) for at least 48 hours prior to first CHG shower. It is OK to shave your face.  Please follow these instructions carefully.   Shower the NIGHT BEFORE SURGERY and the MORNING OF SURGERY  If you chose to wash your hair, wash your hair first as usual with your normal shampoo.  After you shampoo, rinse your hair and body thoroughly to remove the shampoo.  Use CHG Soap as you would any other liquid soap. You can apply CHG directly to the skin and wash gently with a scrungie or a clean washcloth.   Apply the CHG Soap to your body ONLY FROM THE NECK DOWN.  Do not use on open  wounds or open sores. Avoid contact with your eyes, ears, mouth and genitals (private parts). Wash Face and genitals (private parts)  with your normal soap.   Wash thoroughly, paying special attention to the area where your surgery will be performed.  Thoroughly rinse your body with warm water from the neck down.  DO NOT shower/wash with your normal soap after using and rinsing off the CHG Soap.  Pat yourself dry with a CLEAN TOWEL.  Wear CLEAN PAJAMAS to bed the night before surgery  Place CLEAN SHEETS on your bed the night before your surgery  DO NOT SLEEP WITH PETS.   Day of Surgery: Shower with CHG soap. Do not wear jewelry, make up, nail  polish, gel polish, artificial nails, or any other type of covering on natural nails including finger and toenails. If patients have artificial nails, gel coating, etc. that need to be removed by a nail salon please have this removed prior to surgery. Surgery may need to be canceled/delayed if the surgeon/anesthesiologist feels like the patient is unable to be adequately monitored. Do not wear lotions, powders, perfumes, or deodorant. Do not shave 48 hours prior to surgery. Do not bring valuables to the hospital. [facility] is not responsible for any belongings or valuables. Wear Clean/Comfortable clothing the morning of surgery Remember to brush your teeth WITH YOUR REGULAR TOOTHPASTE.   Please read over the following fact sheets that you were given.   3 days prior to your procedure or After your COVID test   You are not required to quarantine however you are required to wear a well-fitting mask when you are out and around people not in your household. If your mask becomes wet or soiled, replace with a new one.   Wash your hands often with soap and water for 20 seconds or clean your hands with an alcohol-based hand sanitizer that contains at least 60% alcohol.   Do not share personal items.   Notify your provider:  o if you are in close contact with someone who has COVID  o or if you develop a fever of 100.4 or greater, sneezing, cough, sore throat, shortness of breath or body aches.

## 2021-08-09 ENCOUNTER — Encounter (HOSPITAL_COMMUNITY)
Admission: RE | Admit: 2021-08-09 | Discharge: 2021-08-09 | Disposition: A | Discharge status: Home / Self Care | Payer: Medicare PPO | Source: Ambulatory Visit | Attending: Cardiovascular Disease | Admitting: Cardiovascular Disease

## 2021-08-09 ENCOUNTER — Encounter (HOSPITAL_COMMUNITY): Payer: Self-pay

## 2021-08-09 ENCOUNTER — Other Ambulatory Visit: Payer: Self-pay

## 2021-08-09 VITALS — BP 118/55 | HR 91 | Temp 97.7°F | Resp 17 | Ht 61.0 in | Wt 96.0 lb

## 2021-08-09 DIAGNOSIS — Z01818 Encounter for other preprocedural examination: Secondary | ICD-10-CM | POA: Insufficient documentation

## 2021-08-09 DIAGNOSIS — I350 Nonrheumatic aortic (valve) stenosis: Secondary | ICD-10-CM | POA: Diagnosis not present

## 2021-08-09 DIAGNOSIS — E8770 Fluid overload, unspecified: Secondary | ICD-10-CM | POA: Insufficient documentation

## 2021-08-09 DIAGNOSIS — J439 Emphysema, unspecified: Secondary | ICD-10-CM | POA: Insufficient documentation

## 2021-08-09 DIAGNOSIS — Z20822 Contact with and (suspected) exposure to covid-19: Secondary | ICD-10-CM | POA: Diagnosis not present

## 2021-08-09 HISTORY — DX: Anxiety disorder, unspecified: F41.9

## 2021-08-09 LAB — CBC
HCT: 38.7 % (ref 36.0–46.0)
Hemoglobin: 12.2 g/dL (ref 12.0–15.0)
MCH: 29.8 pg (ref 26.0–34.0)
MCHC: 31.5 g/dL (ref 30.0–36.0)
MCV: 94.6 fL (ref 80.0–100.0)
Platelets: 172 10*3/uL (ref 150–400)
RBC: 4.09 MIL/uL (ref 3.87–5.11)
RDW: 13.2 % (ref 11.5–15.5)
WBC: 8.4 10*3/uL (ref 4.0–10.5)
nRBC: 0 % (ref 0.0–0.2)

## 2021-08-09 LAB — TYPE AND SCREEN
ABO/RH(D): O POS
Antibody Screen: NEGATIVE

## 2021-08-09 LAB — COMPREHENSIVE METABOLIC PANEL
ALT: 16 U/L (ref 0–44)
AST: 32 U/L (ref 15–41)
Albumin: 3.2 g/dL — ABNORMAL LOW (ref 3.5–5.0)
Alkaline Phosphatase: 63 U/L (ref 38–126)
Anion gap: 10 (ref 5–15)
BUN: 15 mg/dL (ref 8–23)
CO2: 23 mmol/L (ref 22–32)
Calcium: 8.7 mg/dL — ABNORMAL LOW (ref 8.9–10.3)
Chloride: 109 mmol/L (ref 98–111)
Creatinine, Ser: 0.97 mg/dL (ref 0.44–1.00)
GFR, Estimated: 55 mL/min — ABNORMAL LOW (ref >60)
Glucose, Bld: 90 mg/dL (ref 70–99)
Potassium: 3.4 mmol/L — ABNORMAL LOW (ref 3.5–5.1)
Sodium: 142 mmol/L (ref 135–145)
Total Bilirubin: 0.6 mg/dL (ref 0.3–1.2)
Total Protein: 5.9 g/dL — ABNORMAL LOW (ref 6.5–8.1)

## 2021-08-09 LAB — BLOOD GAS, ARTERIAL
Acid-Base Excess: 4.9 mmol/L — ABNORMAL HIGH (ref 0.0–2.0)
Bicarbonate: 29.2 mmol/L — ABNORMAL HIGH (ref 20.0–28.0)
Drawn by: 53845
O2 Saturation: 99 %
Patient temperature: 37
pCO2 arterial: 41 mmHg (ref 32–48)
pH, Arterial: 7.46 — ABNORMAL HIGH (ref 7.35–7.45)
pO2, Arterial: 100 mmHg (ref 83–108)

## 2021-08-09 LAB — SURGICAL PCR SCREEN
MRSA, PCR: POSITIVE — AB
Staphylococcus aureus: POSITIVE — AB

## 2021-08-09 LAB — PROTIME-INR
INR: 0.9 (ref 0.8–1.2)
Prothrombin Time: 11.7 seconds (ref 11.4–15.2)

## 2021-08-09 LAB — BRAIN NATRIURETIC PEPTIDE: B Natriuretic Peptide: 254.8 pg/mL — ABNORMAL HIGH (ref 0.0–100.0)

## 2021-08-09 LAB — SARS CORONAVIRUS 2 (TAT 6-24 HRS): SARS Coronavirus 2: NEGATIVE

## 2021-08-09 NOTE — Progress Notes (Signed)
PCP - Dr. Tedra [place] Cardiologist - Dr. Lauree Chandler  PPM/ICD - denies   Chest x-ray - 08/09/21 at PAT EKG - 08/09/21 at PAT Stress Test - denies ECHO - 05/21/21 Cardiac Cath - 07/11/21  Sleep Study - denies  DM- denies  Blood Thinner Instructions: n/a Aspirin Instructions: n/a  ERAS Protcol - no, NPO   COVID TEST- 08/09/21 at PAT   Anesthesia review: yes, cardiac hx  Patient denies shortness of breath, fever, cough and chest pain at PAT appointment   All instructions explained to the patient, with a verbal understanding of the material. Patient agrees to go over the instructions while at home for a better understanding. Patient also instructed to wear a mask in public after being tested for COVID-19. The opportunity to ask questions was provided.

## 2021-08-12 MED ORDER — POTASSIUM CHLORIDE 2 MEQ/ML IV SOLN
80.0000 meq | INTRAVENOUS | Status: DC
  Filled 2021-08-12: qty 40

## 2021-08-12 MED ORDER — NOREPINEPHRINE 4 MG/250ML-% IV SOLN
0.0000 ug/min | INTRAVENOUS | Status: AC
  Administered 2021-08-13: 6 ug/min via INTRAVENOUS
  Administered 2021-08-13: 4 ug/min via INTRAVENOUS
  Administered 2021-08-13: 12 ug/min via INTRAVENOUS
  Filled 2021-08-12: qty 250

## 2021-08-12 MED ORDER — CEFAZOLIN SODIUM-DEXTROSE 2-4 GM/100ML-% IV SOLN
2.0000 g | INTRAVENOUS | Status: AC
  Administered 2021-08-13: 2 g via INTRAVENOUS
  Filled 2021-08-12: qty 100

## 2021-08-12 MED ORDER — VANCOMYCIN HCL IN DEXTROSE 1-5 GM/200ML-% IV SOLN
1000.0000 mg | INTRAVENOUS | Status: AC
  Administered 2021-08-13: 1000 mg via INTRAVENOUS
  Filled 2021-08-12: qty 200

## 2021-08-12 MED ORDER — DEXMEDETOMIDINE HCL IN NACL 400 MCG/100ML IV SOLN
0.1000 ug/kg/h | INTRAVENOUS | Status: DC
  Filled 2021-08-12: qty 100

## 2021-08-12 MED ORDER — HEPARIN 30,000 UNITS/1000 ML (OHS) CELLSAVER SOLUTION
Status: DC
  Filled 2021-08-12: qty 1000

## 2021-08-12 MED ORDER — MAGNESIUM SULFATE 50 % IJ SOLN
40.0000 meq | INTRAMUSCULAR | Status: DC
  Filled 2021-08-12: qty 9.85

## 2021-08-12 NOTE — H&P (Signed)
[address]       [place] [address]             [phone]      Cardiothoracic Surgery Admission History and Physical   PCP is Baxley, Cresenciano Lick, MD Referring Provider is Lauree Chandler, MD Primary Cardiologist is Lauree Chandler, MD    Reason for admission:  Severe aortic stenosis   HPI:   The patient is a 88 year old woman with a history of previous smoking until 2011 with COPD, hyperlipidemia, remote alcohol abuse, anemia, arthritis, IBS, and severe aortic stenosis.  She has been followed by Dr. Renold Genta with moderate aortic stenosis.  A 2D echocardiogram in January 2019 showed a trileaflet aortic valve with moderate thickening and calcification with a mean gradient of 27 mmHg and a peak gradient of 43 mmHg.  Valve area by VTI was 1.44 cm.  She has had a several year history of exertional fatigue and shortness of breath which was felt to be due to COPD.  She has been treated with bronchodilators but they do not seem to help that much.  She was being evaluated for hand surgery and the heart murmur was noted and she required a cardiac evaluation.  She had a repeat echocardiogram on 05/21/2021.  This showed a severely calcified aortic valve with a mean gradient that increased to 33.7 mmHg with a peak gradient of 57 mmHg.  Aortic valve area was 0.62 cm by VTI.  Dimensionless index was 0.2.  She underwent cardiac catheterization on 07/11/2021 showing mild nonobstructive coronary disease.  PA pressures were normal.   The patient lives with her husband.  She has been very limited in her activity and it seems to be getting worse.  She uses a rolling walker at all times for stability.  She reports exertional fatigue and shortness of breath with minimal activity.  She has had no symptoms at rest.  She denies any dizziness or syncope.  She has had no orthopnea or peripheral edema.  She denies any chest pain or pressure.         Past Medical History:  Diagnosis Date    Alcoholic ([place])      36 years sober   Allergy     Anemia     Aortic stenosis     Arthritis     Asthma      patient denies   B12 deficiency     COPD (chronic obstructive pulmonary disease) ([place])     Depression      not recent   Diverticulosis     Esophageal stricture     Headache      cluster headaches occasionally   Hepatic cyst 01/30/2010   Hiatal hernia 1985    patient unaware   Hx of adenomatous colonic polyps 2006   Hyperlipidemia     IBS (irritable bowel syndrome)     PONV (postoperative nausea and vomiting)      no nausea or vomitting after surgery 07/2014   Positive PPD     PUD (peptic ulcer disease)     Shortness of breath dyspnea      SOB WITH EXERTION   (NOT NEW)           Past Surgical History:  Procedure Laterality Date   ABDOMINAL HYSTERECTOMY        abnormal bleeding   APPENDECTOMY       BARTHOLIN GLAND CYST EXCISION        x2  BREAST BIOPSY Bilateral      Milk glnd   CATARACT EXTRACTION W/ INTRAOCULAR LENS  IMPLANT, BILATERAL Bilateral     COLONOSCOPY       COLONOSCOPY W/ POLYPECTOMY       HARDWARE REMOVAL N/A 12/13/2015    Procedure: Removal of HARDWARE  Lumbar Five-Sacral One ;  Surgeon: Kary Kos, MD;  Location: [facility];  Service: Neurosurgery;  Laterality: N/A;   IR KYPHO LUMBAR INC FX REDUCE BONE BX UNI/BIL CANNULATION INC/IMAGING   07/13/2019   RIGHT/LEFT HEART CATH AND CORONARY ANGIOGRAPHY N/A 07/11/2021    Procedure: RIGHT/LEFT HEART CATH AND CORONARY ANGIOGRAPHY;  Surgeon: Burnell Blanks, MD;  Location: [facility];  Service: Cardiovascular;  Laterality: N/A;   UPPER GI ENDOSCOPY       VAGOTOMY AND PYLOROPLASTY        in [place], [place]           Family History  Problem Relation Age of Onset   Ulcers Father     Stroke Father     Irritable bowel syndrome Father     Ovarian cancer Sister     Colon cancer Neg Hx        Social History         Socioeconomic History   Marital status: Married      Spouse name:  Not on file   Number of children: 2   Years of education: Not on file   Highest education level: Not on file  Occupational History      Employer: RETIRED   Occupation: Retired-office work  Tobacco Use   Smoking status: Former      Years: 60.00      Types: Cigarettes      Quit date: 02/02/2010      Years since quitting: 11.5   Smokeless tobacco: Never  Vaping Use   Vaping Use: Never used  Substance and Sexual Activity   Alcohol use: No      Comment: recovering alcoholic     40 YRS   Drug use: No   Sexual activity: Not on file  Other Topics Concern   Not on file  Social History Narrative    Daily caffeine     Social Determinants of Health    Financial Resource Strain: Not on file  Food Insecurity: Not on file  Transportation Needs: Not on file  Physical Activity: Not on file  Stress: Not on file  Social Connections: Not on file  Intimate Partner Violence: Not on file             Prior to Admission medications   Medication Sig Start Date End Date Taking? Authorizing Provider  albuterol (VENTOLIN HFA) 108 (90 Base) MCG/ACT inhaler INHALE 2 PUFFS INTO THE LUNGS 4 (FOUR) TIMES DAILY AS NEEDED FOR WHEEZING OR SHORTNESS OF BREATH. 07/10/21   Yes Byrum, Rose Fillers, MD  ALPRAZolam (XANAX) 0.25 MG tablet TAKE ONE TAB UP TO TWICE DAILY AS NEEDED FOR ANXIETY AND SLEEP Patient taking differently: Take 0.25 mg by mouth at bedtime. 06/03/21   Yes Baxley, Cresenciano Lick, MD  arformoterol (BROVANA) 15 MCG/2ML NEBU Take 2 mLs (15 mcg total) by nebulization 2 (two) times daily. 07/24/21   Yes Cobb, Karie Schwalbe, NP  budesonide (PULMICORT) 0.5 MG/2ML nebulizer solution Take 0.5 mg by nebulization daily as needed (shortness of breath).     Yes [provider]  Cyanocobalamin (VITAMIN B-12 PO) Take 1 tablet by mouth daily.  Yes [provider]  escitalopram (LEXAPRO) 5 MG tablet TAKE 1 TABLET BY MOUTH EVERY DAY 10/21/20   Yes Baxley, Cresenciano Lick, MD  ferrous sulfate 325 (65 FE) MG EC tablet  Take 1 tablet by mouth once every other day with a glass of orange juice. 01/22/21   Yes Dede Query T, PA-C  ipratropium (ATROVENT) 0.02 % nebulizer solution TAKE 2.5 MLS (0.5 MG TOTAL) BY NEBULIZATION 4 (FOUR) TIMES DAILY. 02/09/21   Yes BaxleyCresenciano Lick, MD  Melatonin 10 MG CAPS Take 10 mg by mouth daily.     Yes [provider]  Multiple Vitamins-Minerals (MULTIVITAMIN WITH MINERALS) tablet Take 1 tablet by mouth daily.     Yes [provider]  pantoprazole (PROTONIX) 40 MG tablet Take 1 tablet (40 mg total) by mouth daily. 07/24/21   Yes Cobb, Karie Schwalbe, NP  Polysaccharide Iron Complex (POLYSACCHARIDE IRON PO) Take 1 capsule by mouth daily.     Yes [provider]  traMADol (ULTRAM) 50 MG tablet Take 50 mg by mouth every 6 (six) hours as needed for moderate pain.  07/18/19   Yes [provider]  traZODone (DESYREL) 50 MG tablet TAKE 3 TABLETS BY MOUTH AT BEDTIME Patient taking differently: Take 100 mg by mouth at bedtime. 06/21/21   Yes Baxley, Cresenciano Lick, MD  fluticasone (FLONASE) 50 MCG/ACT nasal spray Place 1 spray into both nostrils daily. Patient not taking: Reported on 08/05/2021 07/24/21     Clayton Bibles, NP  sucralfate (CARAFATE) 1 GM/10ML suspension Take 10 mLs (1 g total) by mouth 4 (four) times daily -  with meals and at bedtime. Patient not taking: Reported on 08/05/2021 07/19/21     Malvin Johns, MD            Current Outpatient Medications  Medication Sig Dispense Refill   albuterol (VENTOLIN HFA) 108 (90 Base) MCG/ACT inhaler INHALE 2 PUFFS INTO THE LUNGS 4 (FOUR) TIMES DAILY AS NEEDED FOR WHEEZING OR SHORTNESS OF BREATH. 36 each 6   ALPRAZolam (XANAX) 0.25 MG tablet TAKE ONE TAB UP TO TWICE DAILY AS NEEDED FOR ANXIETY AND SLEEP (Patient taking differently: Take 0.25 mg by mouth at bedtime.) 60 tablet 5   arformoterol (BROVANA) 15 MCG/2ML NEBU Take 2 mLs (15 mcg total) by nebulization 2 (two) times daily. 120 mL 2   budesonide (PULMICORT) 0.5 MG/2ML  nebulizer solution Take 0.5 mg by nebulization daily as needed (shortness of breath).       Cyanocobalamin (VITAMIN B-12 PO) Take 1 tablet by mouth daily.       escitalopram (LEXAPRO) 5 MG tablet TAKE 1 TABLET BY MOUTH EVERY DAY 90 tablet 2   ferrous sulfate 325 (65 FE) MG EC tablet Take 1 tablet by mouth once every other day with a glass of orange juice. 30 tablet 3   ipratropium (ATROVENT) 0.02 % nebulizer solution TAKE 2.5 MLS (0.5 MG TOTAL) BY NEBULIZATION 4 (FOUR) TIMES DAILY. 62.5 mL 12   Melatonin 10 MG CAPS Take 10 mg by mouth daily.       Multiple Vitamins-Minerals (MULTIVITAMIN WITH MINERALS) tablet Take 1 tablet by mouth daily.       pantoprazole (PROTONIX) 40 MG tablet Take 1 tablet (40 mg total) by mouth daily. 30 tablet 2   Polysaccharide Iron Complex (POLYSACCHARIDE IRON PO) Take 1 capsule by mouth daily.       traMADol (ULTRAM) 50 MG tablet Take 50 mg by mouth every 6 (six) hours as needed for moderate  pain.        traZODone (DESYREL) 50 MG tablet TAKE 3 TABLETS BY MOUTH AT BEDTIME (Patient taking differently: Take 100 mg by mouth at bedtime.) 270 tablet 0   fluticasone (FLONASE) 50 MCG/ACT nasal spray Place 1 spray into both nostrils daily. (Patient not taking: Reported on 08/05/2021) 18.2 mL 2   sucralfate (CARAFATE) 1 GM/10ML suspension Take 10 mLs (1 g total) by mouth 4 (four) times daily -  with meals and at bedtime. (Patient not taking: Reported on 08/05/2021) 420 mL 0    No current facility-administered medications for this visit.           Allergies  Allergen Reactions   Aspirin Other (See Comments)      Hx of ulcers, stomach issues            Review of Systems:               General:                      + decreased appetite, +decreased energy, + weight gain, + weight loss, no fever             Cardiac:                       no chest pain with exertion, no chest pain at rest, +SOB with mild exertion, no resting SOB, no PND, no orthopnea, no palpitations, no  arrhythmia, no atrial fibrillation, no LE edema, no dizzy spells, no syncope             Respiratory:                 + exertional shortness of breath, no home oxygen, no productive cough, no dry cough, no bronchitis, no wheezing, no hemoptysis, no asthma, no pain with inspiration or cough, no sleep apnea, no CPAP at night             GI:                               no difficulty swallowing, no reflux, no frequent heartburn, no hiatal hernia, no abdominal pain, no constipation, no diarrhea, no hematochezia, no hematemesis, no melena             GU:                              no dysuria,  no frequency, no urinary tract infection, no hematuria, no kidney stones, no kidney disease             Vascular:                     no pain suggestive of claudication, no pain in feet, no leg cramps, no varicose veins, no DVT, no non-healing foot ulcer             Neuro:                         no stroke, no TIA's, no seizures, no headaches, no temporary blindness one eye,  no slurred speech, no peripheral neuropathy, no chronic pain, + instability of gait, +memory/cognitive dysfunction             Musculoskeletal:         + arthritis,  no joint swelling, no myalgias, + difficulty walking, + decreased mobility              Skin:                            no rash, no itching, no skin infections, no pressure sores or ulcerations             Psych:                         + anxiety, + depression, + nervousness, + unusual recent stress             Eyes:                           no blurry vision, no floaters, no recent vision changes, + wears glasses            ENT:                            no hearing loss, no loose or painful teeth, + partial dentures, no dental problems             Hematologic:               + easy bruising, no abnormal bleeding, no clotting disorder, no frequent epistaxis             Endocrine:                   no diabetes, does not check CBG's at home                            Physical  Exam:               BP (!) 142/80 (BP Location: Right Arm, Patient Position: Sitting)    Pulse 76    Resp 20    Ht 5\' 1"  (1.549 m)    Wt 97 lb (44 kg)    SpO2 94% Comment: RA   BMI 18.33 kg/m              General:                      frail-appearing             HEENT:                       Unremarkable, NCAT, PERLA, EOMI             Neck:                           no JVD, no bruits, no adenopathy              Chest:                          clear to auscultation, symmetrical breath sounds, no wheezes, no rhonchi              CV:                              RRR, 3/6 systolic  murmur RSB, no diastolic murmur             Abdomen:                    soft, non-tender, no masses              Extremities:                 warm, well-perfused, pulses palpable at ankle, no lower extremity edema             Rectal/GU                   Deferred             Neuro:                         Grossly non-focal and symmetrical throughout             Skin:                            Clean and dry, no rashes, no breakdown   Diagnostic Tests:     ECHOCARDIOGRAM REPORT         Patient Name:   Katherine [name] Date of Exam: 05/21/2021  Medical Rec #:  [id number]         Height:       60.0 in  Accession #:    [id number]        Weight:       93.0 lb  Date of Birth:  08-14-1928          BSA:          1.348 m  Patient Age:    32 years          BP:           118/78 mmHg  Patient Gender: F                 HR:           78 bpm.  Exam Location:  Outpatient   Procedure: 2D Echo and 3D Echo   Indications:     Pre-op evaluation     History:         Patient has prior history of Echocardiogram examinations,  most                   recent 07/16/2017. COPD; Risk Factors:Dyslipidemia.     Sonographer:     Mikki Santee RDCS  Referring Phys:  [place]  Diagnosing Phys: Oswaldo Milian MD   IMPRESSIONS     1. Left ventricular ejection fraction, by estimation, is 60 to 65%. The  left ventricle has  normal function. The left ventricle has no regional  wall motion abnormalities. There is mild left ventricular hypertrophy.  Left ventricular diastolic parameters  are consistent with Grade II diastolic dysfunction (pseudonormalization).  Elevated left atrial pressure.   2. Right ventricular systolic function is normal. The right ventricular  size is normal. Tricuspid regurgitation signal is inadequate for assessing  PA pressure.   3. The mitral valve is degenerative. Trivial mitral valve regurgitation.  Mild mitral stenosis. MG 43mmHg at 72bpm, MVA 1.6 cm^2 by continuity  equation. Moderate mitral annular calcification.   4. The inferior vena cava is normal in size with greater than 50%  respiratory variability, suggesting right atrial pressure  of 3 mmHg.   5. The aortic valve is calcified. There is severe calcifcation of the  aortic valve. Aortic valve regurgitation is not visualized. Severe aortic  valve stenosis. Vmax 3.9 m/s, MG 34 mmHg, AVA 0.6 cm^2, DI 0.19   FINDINGS   Left Ventricle: Left ventricular ejection fraction, by estimation, is 60  to 65%. The left ventricle has normal function. The left ventricle has no  regional wall motion abnormalities. 3D left ventricular ejection fraction  analysis performed but not  reported based on interpreter judgement due to suboptimal tracking. The  left ventricular internal cavity size was small. There is mild left  ventricular hypertrophy. Left ventricular diastolic parameters are  consistent with Grade II diastolic dysfunction  (pseudonormalization). Elevated left atrial pressure.   Right Ventricle: The right ventricular size is normal. No increase in  right ventricular wall thickness. Right ventricular systolic function is  normal. Tricuspid regurgitation signal is inadequate for assessing PA  pressure.   Left Atrium: Left atrial size was normal in size.   Right Atrium: Right atrial size was normal in size.   Pericardium: Trivial  pericardial effusion is present.   Mitral Valve: The mitral valve is degenerative in appearance. Moderate  mitral annular calcification. Trivial mitral valve regurgitation. Mild  mitral valve stenosis.   Tricuspid Valve: The tricuspid valve is normal in structure. Tricuspid  valve regurgitation is trivial.   Aortic Valve: The aortic valve is calcified. There is severe calcifcation  of the aortic valve. Aortic valve regurgitation is not visualized. Severe  aortic stenosis is present. Aortic valve mean gradient measures 33.7 mmHg.  Aortic valve peak gradient  measures 57.0 mmHg. Aortic valve area, by VTI measures 0.62 cm.   Pulmonic Valve: The pulmonic valve was not well visualized. Pulmonic valve  regurgitation is not visualized.   Aorta: The aortic root and ascending aorta are structurally normal, with  no evidence of dilitation.   Venous: The inferior vena cava is normal in size with greater than 50%  respiratory variability, suggesting right atrial pressure of 3 mmHg.   IAS/Shunts: The interatrial septum was not well visualized.      LEFT VENTRICLE  PLAX 2D  LVIDd:         3.70 cm   Diastology  LVIDs:         2.30 cm   LV e' medial:    4.28 cm/s  LV PW:         0.80 cm   LV E/e' medial:  23.4  LV IVS:        0.70 cm   LV e' lateral:   4.20 cm/s  LVOT diam:     2.00 cm   LV E/e' lateral: 23.8  LV SV:         58  LV SV Index:   43  LVOT Area:     3.14 cm                              3D Volume EF:                           3D EF:        53 %                           LV EDV:       99 ml  LV ESV:       46 ml                           LV SV:        53 ml   RIGHT VENTRICLE  RV S prime:     11.10 cm/s  TAPSE (M-mode): 1.8 cm   LEFT ATRIUM             Index        RIGHT ATRIUM           Index  LA diam:        2.00 cm 1.48 cm/m   RA Area:     11.30 cm  LA Vol (A2C):   37.0 ml 27.45 ml/m  RA Volume:   24.20 ml  17.95 ml/m  LA Vol (A4C):   29.7  ml 22.03 ml/m  LA Biplane Vol: 34.8 ml 25.81 ml/m   AORTIC VALVE  AV Area (Vmax):    0.63 cm  AV Area (Vmean):   0.59 cm  AV Area (VTI):     0.62 cm  AV Vmax:           377.33 cm/s  AV Vmean:          275.000 cm/s  AV VTI:            0.946 m  AV Peak Grad:      57.0 mmHg  AV Mean Grad:      33.7 mmHg  LVOT Vmax:         75.30 cm/s  LVOT Vmean:        51.400 cm/s  LVOT VTI:          0.186 m  LVOT/AV VTI ratio: 0.20     AORTA  Ao Root diam: 2.80 cm   MITRAL VALVE  MV Area (PHT): 3.37 cm     SHUNTS  MV Decel Time: 225 msec     Systemic VTI:  0.19 m  MV E velocity: 100.00 cm/s  Systemic Diam: 2.00 cm  MV A velocity: 154.00 cm/s  MV E/A ratio:  0.65   Oswaldo Milian MD  Electronically signed by Oswaldo Milian MD  Signature Date/Time: 05/21/2021/4:05:55 PM         Final (Updated)     Physicians   Panel Physicians Referring Physician Case Authorizing Physician  Burnell Blanks, MD (Primary)        Procedures   RIGHT/LEFT HEART CATH AND CORONARY ANGIOGRAPHY    Conclusion       Ost Cx to Prox Cx lesion is 30% stenosed.   Prox RCA lesion is 30% stenosed.   Mild non-obstructive CAD Severe aortic stenosis by echo. I did not cross the aortic valve today.  RA 2 RV 36/0/4 PA 38/13 mean 24 PCWP 11 AO 145/57   Recommendations: Continue workup for TAVR   Indications   Severe aortic stenosis [I35.0 (ICD-10-CM)]    Procedural Details   Technical Details Indication: Severe aortic stenosis, workup for TAVR  Procedure: The risks, benefits, complications, treatment options, and expected outcomes were discussed with the patient. The patient and/or family concurred with the proposed plan, giving informed consent. The patient was brought to the cath lab after IV hydration was given. The patient was sedated with Versed and Fentanyl. The IV catheter in the right antecubital vein was changed for a 5 [place] sheath. Right heart catheterization performed  with a balloon tipped catheter.  The right groin was prepped and draped in the usual manner. Using the modified Seldinger access technique, a 5 French sheath was placed in the right femoral artery using u/s guidance. Standard diagnostic catheters were used to perform selective coronary angiography. I did not cross the aortic valve. Mynx closure device placed in right femoral artery.   There were no immediate complications. The patient was taken to the recovery area in stable condition.     Estimated blood loss <50 mL.   During this procedure medications were administered to achieve and maintain moderate conscious sedation while the patient's heart rate, blood pressure, and oxygen saturation were continuously monitored and I was present face-to-face 100% of this time.    Medications (Filter: Administrations occurring from 0919 to 0959 on 07/11/21)  important  Continuous medications are totaled by the amount administered until 07/11/21 0959.    fentaNYL (SUBLIMAZE) injection (mcg) Total dose:  25 mcg Date/Time Rate/Dose/Volume Action    07/11/21 0924 25 mcg Given      midazolam (VERSED) injection (mg) Total dose:  1 mg Date/Time Rate/Dose/Volume Action    07/11/21 0924 1 mg Given      Heparin (Porcine) in NaCl 1000-0.9 UT/500ML-% SOLN (mL) Total volume:  1,000 mL Date/Time Rate/Dose/Volume Action    07/11/21 0925 500 mL Given    0925 500 mL Given      lidocaine (PF) (XYLOCAINE) 1 % injection (mL) Total volume:  22 mL Date/Time Rate/Dose/Volume Action    07/11/21 0928 2 mL Given    0939 20 mL Given      iohexol (OMNIPAQUE) 350 MG/ML injection (mL) Total volume:  50 mL Date/Time Rate/Dose/Volume Action    07/11/21 0958 50 mL Given      Sedation Time   Sedation Time Physician-1: 25 minutes 11 seconds Contrast   Medication Name Total Dose  iohexol (OMNIPAQUE) 350 MG/ML injection 50 mL    Radiation/Fluoro   Fluoro time: 3.5 (min) DAP: 4003 (mGycm2) Cumulative Air Kerma:  79 (mGy) Complications      Complications documented before study signed (07/11/2021 90:24 AM)     No complications were associated with this study.  Documented by Jamie Kato - 07/11/2021  9:47 AM      Coronary Findings   Diagnostic Dominance: Left Left Circumflex  Ost Cx to Prox Cx lesion is 30% stenosed.    Right Coronary Artery  Vessel is small.  Prox RCA lesion is 30% stenosed.    Intervention    No interventions have been documented.    Coronary Diagrams   Diagnostic Dominance: Left Intervention   Implants         Vascular Products   Closure Mynx Control 6f - [id number] - Implanted Inventory item: CLOSURE [employer] CONTROL 40F Model/Cat number: [id number]  Manufacturer: [employer] Lot number: [id number]  Device identifier: [id number] Device identifier type: GS1  GUDID Information   Request status Successful      Brand name: MYNX CONTROL Version/Model: [id number]  Company name: [employer] safety info as of 07/11/21: MR Safe  Contains dry or latex rubber: No      GMDN P.T. name: Wound hydrogel dressing, non-antimicrobial        As of 07/11/2021   Status: Implanted          [employer] Images    Show images for CARDIAC CATHETERIZATION Images on Long Term Storage    Show images for Martes, GALINA HADDOX to Procedure Log   Procedure Log  Hemo Data   Flowsheet Row Most Recent Value  Fick Cardiac Output 4.67 L/min  Fick Cardiac Output Index 3.43 (L/min)/BSA  RA A Wave 6 mmHg  RA V Wave 2 mmHg  RA Mean 1 mmHg  RV Systolic Pressure 36 mmHg  RV Diastolic Pressure 0 mmHg  RV EDP 4 mmHg  PA Systolic Pressure 38 mmHg  PA Diastolic Pressure 13 mmHg  PA Mean 24 mmHg  PW A Wave 14 mmHg  PW V Wave 11 mmHg  PW Mean 11 mmHg  AO Systolic Pressure 323 mmHg  AO Diastolic Pressure 55 mmHg  AO Mean 94 mmHg  QP/QS 1  TPVR Index 7 HRUI  TSVR Index 27.13 HRUI  PVR SVR Ratio 0.14  TPVR/TSVR Ratio 0.26      ADDENDUM REPORT: 07/29/2021  12:17   CLINICAL DATA:  Aortic stenosis   EXAM: Cardiac TAVR CT   TECHNIQUE: The patient was scanned on a Siemens Force 557 slice scanner. A 120 kV retrospective scan was triggered in the descending thoracic aorta at 111 HU's. Gantry rotation speed was 270 msecs and collimation was .9 mm. No beta blockade or nitro were given. The 3D data set was reconstructed in 5% intervals of the R-R cycle. Systolic and diastolic phases were analyzed on a dedicated work station using MPR, MIP and VRT modes. The patient received 80 cc of contrast.   FINDINGS: Aortic Valve: Tri leaflet calcified with restricted leaflet motion Functionally bi cuspid with partial fusion of left and right cusps   Aorta: No aneurysm normal arch vessels moderate calcific atherosclerosis   Sinotubular Junction: 25 mm   Ascending Thoracic Aorta: 30 mm   Aortic Arch: 24 mm   Descending Thoracic Aorta: 23 mm   Sinus of Valsalva Measurements:   Non-coronary: 27.7 mm   Right - coronary: 24.2 mm   Left - coronary: 27.2 mm   Coronary Artery Height above Annulus:   Left Main: 12.5 mm above annulus   Right Coronary: 12.8 mm above annulus   Virtual Basal Annulus Measurements:   Maximum/Minimum Diameter: 23.04 mm x 19.4 mm   Perimeter: 69 mm   Area: 359 mm2   Coronary Arteries: Sufficient height above annulus for deployment   Optimum Fluoroscopic Angle for Delivery: LAO 7 Caudal 13 degrees   IMPRESSION: 1. Functionally bi cuspid AV with annular area 359 mm2 suitable for a 23 mm Sapien 3 valve   2.  Coronary arteries sufficient height above annulus for deployment   3.  Membranous septal length 9.6 mm   4.  AV calcium score 2142   5. Optimum angiographic angle for deployment LAO 7 Caudal 13 degrees   Jenkins Rouge     Electronically Signed   By: Jenkins Rouge M.D.   On: 07/29/2021 12:17     Narrative & Impression  CLINICAL DATA:  Aortic valve replacement (TAVR), pre-op eval.   EXAM: CT  ANGIOGRAPHY CHEST, ABDOMEN AND PELVIS   TECHNIQUE: Multidetector CT imaging through the chest, abdomen and pelvis was performed using the standard protocol during bolus administration of intravenous contrast. Multiplanar reconstructed images and MIPs were obtained and reviewed to evaluate the vascular anatomy.   RADIATION DOSE REDUCTION: This exam was performed according to the departmental dose-optimization program which includes automated exposure control, adjustment of the mA and/or kV according to patient size and/or use of iterative reconstruction technique.   CONTRAST:  65mL OMNIPAQUE IOHEXOL 350 MG/ML SOLN   COMPARISON:  08/09/2020 chest CT. 06/23/2019 CT chest, abdomen and pelvis.  FINDINGS: CTA CHEST FINDINGS   Cardiovascular: Top-normal heart size. Diffuse thickening and coarse calcification of the aortic valve. No significant pericardial effusion/thickening. Three-vessel coronary atherosclerosis. Atherosclerotic nonaneurysmal thoracic aorta. Normal caliber pulmonary arteries. No central pulmonary emboli.   Mediastinum/Nodes: Subcentimeter hypodense bilateral thyroid nodules, unchanged. Not clinically significant; no follow-up imaging recommended (ref: J Am Coll Radiol. 2015 Feb;12(2): 143-50). Unremarkable esophagus. No pathologically enlarged axillary, mediastinal or hilar lymph nodes.   Lungs/Pleura: No pneumothorax. No pleural effusion. Severe centrilobular emphysema with mild diffuse bronchial wall thickening. Stable curvilinear parenchymal bands in the posterior right upper lobe compatible with nonspecific postinfectious/postinflammatory scarring. Solid 0.6 cm peripheral right lower lobe pulmonary nodule (series 5/image 65), stable and considered benign. Small 0.3 cm posterior left upper lobe solid pulmonary nodule (series 5/image 57) along major fissure, decreased from 0.4 cm, considered benign. No acute consolidative airspace disease, lung masses or new  significant pulmonary nodules.   Musculoskeletal: No aggressive appearing focal osseous lesions. Chronic moderate T8 and T9, mild T10 and moderate T12 vertebral compression fractures. Mild thoracic spondylosis.   CTA ABDOMEN AND PELVIS FINDINGS   Hepatobiliary: Normal liver size. Scattered simple liver cysts, largest 1.6 cm anteriorly in the left liver lobe. A few additional scattered subcentimeter hypodense liver lesions are too small to characterize. Normal gallbladder with no radiopaque cholelithiasis. No biliary ductal dilatation.   Pancreas: Normal, with no mass or significant duct dilation.   Spleen: Normal size. No mass.   Adrenals/Urinary Tract: Normal adrenals. No hydronephrosis. Subcentimeter hypodense medial upper left renal cortical lesion is too small to characterize and is unchanged, considered benign. Otherwise no contour deforming renal masses. Normal bladder.   Stomach/Bowel: Surgical clips surround the esophagogastric junction. Stomach is nondistended and otherwise normal. Normal caliber small bowel with no small bowel wall thickening. Appendix not discretely visualized. Mild sigmoid diverticulosis, with no large bowel wall thickening or significant pericolonic fat stranding.   Vascular/Lymphatic: Atherosclerotic nonaneurysmal abdominal aorta. No pathologically enlarged lymph nodes in the abdomen or pelvis.   Reproductive: Status post hysterectomy, with no abnormal findings at the vaginal cuff. No adnexal mass.   Other: No pneumoperitoneum. Trace pelvic ascites. No focal fluid collections.   Musculoskeletal: No aggressive appearing focal osseous lesions. Chronic severe L1 vertebral compression fracture status post vertebroplasty. Status post bilateral posterior spinal fusion L3-L4. Bone cages in the L3-4 and L4-5 disc spaces. Marked multilevel lumbar degenerative disc disease.   VASCULAR MEASUREMENTS PERTINENT TO TAVR:   AORTA:   Minimal Aortic  Diameter-11.5 x 10.9 mm   Severity of Aortic Calcification-marked   RIGHT PELVIS:   Right Common Iliac Artery -   Minimal Diameter-6.7 x 5.0 mm   Tortuosity-mild   Calcification-marked   Right External Iliac Artery -   Minimal Diameter-4.3 x 3.9 mm   Tortuosity-mild   Calcification-marked   Right Common Femoral Artery -   Minimal Diameter-5.1 x 4.2 mm   Tortuosity-mild   Calcification-marked   LEFT PELVIS:   Left Common Iliac Artery -   Minimal Diameter-7.0 x 6.5 mm   Tortuosity-mild   Calcification-marked   Left External Iliac Artery -   Minimal Diameter-5.5 x 4.4 mm   Tortuosity-mild   Calcification-marked   Left Common Femoral Artery -   Minimal Diameter-4.6 x 3.8 mm   Tortuosity-mild   Calcification-marked   Review of the MIP images confirms the above findings.   IMPRESSION: 1. Vascular findings and measurements pertinent to potential TAVR procedure, as detailed. Note diminutive iliofemoral arteries bilaterally. 2. Diffuse thickening and coarse  calcification of the aortic valve, which often correlates with aortic stenosis. 3. Three-vessel coronary atherosclerosis. 4. Severe centrilobular emphysema with mild diffuse bronchial wall thickening, suggesting COPD. 5. Trace pelvic ascites. 6. Mild sigmoid diverticulosis. 7. Multiple chronic thoracolumbar vertebral compression fractures. 8. Aortic Atherosclerosis (ICD10-I70.0) and Emphysema (ICD10-J43.9).     Electronically Signed   By: Ilona Sorrel M.D.   On: 07/29/2021 11:56    Impression:   This 88 year old woman has stage D, severe, symptomatic aortic stenosis with New York Heart Association class III symptoms of exertional fatigue and shortness of breath consistent with chronic diastolic congestive heart failure.  She also has a history of remote smoking and COPD which contributes to her shortness of breath but her symptoms have been worsening over the last couple years and are likely  worsening due to her aortic stenosis.  I have personally reviewed her 2D echocardiogram, cardiac catheterization, and CTA studies.  Her echo shows a heavily calcified and thickened aortic valve with restricted leaflet mobility.  It looks like a bicuspid valve to me.  The mean gradient was 34 mmHg with a valve area of 0.62 cm consistent with severe aortic stenosis.  Left ventricular ejection fraction is 60 to 65% with grade 2 diastolic dysfunction.  There is trivial mitral regurgitation.  Cardiac catheterization shows mild nonobstructive coronary disease with normal PA pressures.  Given her advanced age and general frailty I do not think she is a candidate for open surgical aortic valve replacement but transcatheter aortic valve replacement is a reasonable option.  She is still living independently at home with her husband and would like to be more active but is limited due to her exertional fatigue and shortness of breath.  Her gated cardiac CTA shows anatomy suitable for TAVR using a SAPIEN 3 valve.  Her abdominal and pelvic CTA shows relatively small iliac and femoral vessels with moderate calcified atherosclerosis that may be too small to allow transfemoral insertion.  She does appear to have suitable right common carotid artery access.  Left subclavian and left carotid access are not possible due to size and atherosclerosis.   The patient and her husband were counseled at length regarding treatment alternatives for management of severe symptomatic aortic stenosis. The risks and benefits of surgical intervention has been discussed in detail. Long-term prognosis with medical therapy was discussed. Alternative approaches such as conventional surgical aortic valve replacement, transcatheter aortic valve replacement, and palliative medical therapy were compared and contrasted at length. This discussion was placed in the context of the patient's own specific clinical presentation and past medical history. All of  their questions have been addressed.    Following the decision to proceed with transcatheter aortic valve replacement, a discussion was held regarding what types of management strategies would be attempted intraoperatively in the event of life-threatening complications, including whether or not the patient would be considered a candidate for the use of cardiopulmonary bypass and/or conversion to open sternotomy for attempted surgical intervention.  I do not think she is a candidate for emergent sternotomy or use of cardiopulmonary bypass to manage any intraoperative complications given her advanced age and frailty.  The patient has been advised of a variety of complications that might develop including but not limited to risks of death, stroke, paravalvular leak, aortic dissection or other major vascular complications, aortic annulus rupture, device embolization, cardiac rupture or perforation, mitral regurgitation, acute myocardial infarction, arrhythmia, heart block or bradycardia requiring permanent pacemaker placement, congestive heart failure, respiratory failure, renal failure, pneumonia,  infection, other late complications related to structural valve deterioration or migration, or other complications that might ultimately cause a temporary or permanent loss of functional independence or other long term morbidity. The patient provides full informed consent for the procedure as described and all questions were answered.       Plan:   TAVR using a SAPIEN 3 valve through a right common carotid artery approach.       Gaye Pollack, MD

## 2021-08-13 ENCOUNTER — Inpatient Hospital Stay (HOSPITAL_COMMUNITY): Payer: Medicare PPO | Admitting: Physician Assistant

## 2021-08-13 ENCOUNTER — Encounter (HOSPITAL_COMMUNITY)
Admission: RE | Disposition: A | Discharge status: Home Health | Payer: Self-pay | Source: Home / Self Care | Attending: Surgery

## 2021-08-13 ENCOUNTER — Other Ambulatory Visit: Payer: Self-pay

## 2021-08-13 ENCOUNTER — Encounter (HOSPITAL_COMMUNITY): Payer: Self-pay | Admitting: Cardiovascular Disease

## 2021-08-13 ENCOUNTER — Inpatient Hospital Stay (HOSPITAL_COMMUNITY): Payer: Medicare PPO | Admitting: Certified Registered"

## 2021-08-13 ENCOUNTER — Other Ambulatory Visit: Payer: Self-pay | Admitting: Physician Assistant

## 2021-08-13 ENCOUNTER — Inpatient Hospital Stay (HOSPITAL_COMMUNITY): Payer: Medicare PPO

## 2021-08-13 ENCOUNTER — Inpatient Hospital Stay (HOSPITAL_COMMUNITY)
Admission: RE | Admit: 2021-08-13 | Discharge: 2021-08-19 | DRG: 267 | Disposition: A | Discharge status: Home Health | Payer: Medicare PPO | Attending: Surgery | Admitting: Surgery

## 2021-08-13 DIAGNOSIS — F32A Depression, unspecified: Secondary | ICD-10-CM | POA: Diagnosis present

## 2021-08-13 DIAGNOSIS — D509 Iron deficiency anemia, unspecified: Secondary | ICD-10-CM | POA: Diagnosis present

## 2021-08-13 DIAGNOSIS — F1021 Alcohol dependence, in remission: Secondary | ICD-10-CM | POA: Diagnosis not present

## 2021-08-13 DIAGNOSIS — Z006 Encounter for examination for normal comparison and control in clinical research program: Secondary | ICD-10-CM

## 2021-08-13 DIAGNOSIS — I700 Atherosclerosis of aorta: Secondary | ICD-10-CM | POA: Diagnosis present

## 2021-08-13 DIAGNOSIS — R54 Age-related physical debility: Secondary | ICD-10-CM | POA: Diagnosis present

## 2021-08-13 DIAGNOSIS — Z7951 Long term (current) use of inhaled steroids: Secondary | ICD-10-CM | POA: Diagnosis not present

## 2021-08-13 DIAGNOSIS — I312 Hemopericardium, not elsewhere classified: Secondary | ICD-10-CM | POA: Diagnosis not present

## 2021-08-13 DIAGNOSIS — E875 Hyperkalemia: Secondary | ICD-10-CM | POA: Diagnosis not present

## 2021-08-13 DIAGNOSIS — I314 Cardiac tamponade: Secondary | ICD-10-CM | POA: Diagnosis not present

## 2021-08-13 DIAGNOSIS — Z952 Presence of prosthetic heart valve: Secondary | ICD-10-CM | POA: Diagnosis not present

## 2021-08-13 DIAGNOSIS — J432 Centrilobular emphysema: Secondary | ICD-10-CM | POA: Diagnosis not present

## 2021-08-13 DIAGNOSIS — F418 Other specified anxiety disorders: Secondary | ICD-10-CM | POA: Diagnosis not present

## 2021-08-13 DIAGNOSIS — I5032 Chronic diastolic (congestive) heart failure: Secondary | ICD-10-CM | POA: Diagnosis present

## 2021-08-13 DIAGNOSIS — Z981 Arthrodesis status: Secondary | ICD-10-CM

## 2021-08-13 DIAGNOSIS — J439 Emphysema, unspecified: Secondary | ICD-10-CM | POA: Diagnosis not present

## 2021-08-13 DIAGNOSIS — I3139 Other pericardial effusion (noninflammatory): Secondary | ICD-10-CM

## 2021-08-13 DIAGNOSIS — I2510 Atherosclerotic heart disease of native coronary artery without angina pectoris: Secondary | ICD-10-CM | POA: Diagnosis present

## 2021-08-13 DIAGNOSIS — Z20822 Contact with and (suspected) exposure to covid-19: Secondary | ICD-10-CM | POA: Diagnosis present

## 2021-08-13 DIAGNOSIS — J811 Chronic pulmonary edema: Secondary | ICD-10-CM | POA: Diagnosis not present

## 2021-08-13 DIAGNOSIS — M549 Dorsalgia, unspecified: Secondary | ICD-10-CM | POA: Diagnosis present

## 2021-08-13 DIAGNOSIS — J449 Chronic obstructive pulmonary disease, unspecified: Secondary | ICD-10-CM

## 2021-08-13 DIAGNOSIS — I9581 Postprocedural hypotension: Secondary | ICD-10-CM | POA: Diagnosis not present

## 2021-08-13 DIAGNOSIS — Z8041 Family history of malignant neoplasm of ovary: Secondary | ICD-10-CM | POA: Diagnosis not present

## 2021-08-13 DIAGNOSIS — K5730 Diverticulosis of large intestine without perforation or abscess without bleeding: Secondary | ICD-10-CM | POA: Diagnosis present

## 2021-08-13 DIAGNOSIS — Z79899 Other long term (current) drug therapy: Secondary | ICD-10-CM

## 2021-08-13 DIAGNOSIS — Z681 Body mass index (BMI) 19 or less, adult: Secondary | ICD-10-CM | POA: Diagnosis not present

## 2021-08-13 DIAGNOSIS — Z823 Family history of stroke: Secondary | ICD-10-CM | POA: Diagnosis not present

## 2021-08-13 DIAGNOSIS — K219 Gastro-esophageal reflux disease without esophagitis: Secondary | ICD-10-CM | POA: Diagnosis present

## 2021-08-13 DIAGNOSIS — Z954 Presence of other heart-valve replacement: Secondary | ICD-10-CM | POA: Diagnosis not present

## 2021-08-13 DIAGNOSIS — Z886 Allergy status to analgesic agent status: Secondary | ICD-10-CM

## 2021-08-13 DIAGNOSIS — F339 Major depressive disorder, recurrent, unspecified: Secondary | ICD-10-CM | POA: Diagnosis present

## 2021-08-13 DIAGNOSIS — Z87891 Personal history of nicotine dependence: Secondary | ICD-10-CM | POA: Diagnosis not present

## 2021-08-13 DIAGNOSIS — I350 Nonrheumatic aortic (valve) stenosis: Secondary | ICD-10-CM

## 2021-08-13 DIAGNOSIS — F419 Anxiety disorder, unspecified: Secondary | ICD-10-CM | POA: Diagnosis present

## 2021-08-13 DIAGNOSIS — K279 Peptic ulcer, site unspecified, unspecified as acute or chronic, without hemorrhage or perforation: Secondary | ICD-10-CM

## 2021-08-13 DIAGNOSIS — N179 Acute kidney failure, unspecified: Secondary | ICD-10-CM

## 2021-08-13 DIAGNOSIS — Z9689 Presence of other specified functional implants: Secondary | ICD-10-CM

## 2021-08-13 DIAGNOSIS — E785 Hyperlipidemia, unspecified: Secondary | ICD-10-CM | POA: Diagnosis present

## 2021-08-13 DIAGNOSIS — E441 Mild protein-calorie malnutrition: Secondary | ICD-10-CM | POA: Diagnosis present

## 2021-08-13 DIAGNOSIS — I493 Ventricular premature depolarization: Secondary | ICD-10-CM | POA: Diagnosis present

## 2021-08-13 DIAGNOSIS — D649 Anemia, unspecified: Secondary | ICD-10-CM | POA: Diagnosis present

## 2021-08-13 DIAGNOSIS — K589 Irritable bowel syndrome without diarrhea: Secondary | ICD-10-CM | POA: Diagnosis present

## 2021-08-13 DIAGNOSIS — M1990 Unspecified osteoarthritis, unspecified site: Secondary | ICD-10-CM | POA: Diagnosis not present

## 2021-08-13 DIAGNOSIS — G8929 Other chronic pain: Secondary | ICD-10-CM | POA: Diagnosis present

## 2021-08-13 DIAGNOSIS — R609 Edema, unspecified: Secondary | ICD-10-CM | POA: Diagnosis not present

## 2021-08-13 DIAGNOSIS — Z9889 Other specified postprocedural states: Secondary | ICD-10-CM

## 2021-08-13 HISTORY — PX: TRANSCATHETER AORTIC VALVE REPLACEMENT, CAROTID: SHX6798

## 2021-08-13 HISTORY — PX: PERICARDIAL WINDOW: SHX2213

## 2021-08-13 HISTORY — DX: Nonrheumatic aortic (valve) stenosis: I35.0

## 2021-08-13 HISTORY — DX: Presence of prosthetic heart valve: Z95.2

## 2021-08-13 HISTORY — PX: TEE WITHOUT CARDIOVERSION: SHX5443

## 2021-08-13 LAB — ECHO TEE
AR max vel: 1.26 cm2
AV Area VTI: 1.31 cm2
AV Area mean vel: 1.23 cm2
AV Mean grad: 19.8 mmHg
AV Peak grad: 28.7 mmHg
Ao pk vel: 2.68 m/s
MV VTI: 2.4 cm2

## 2021-08-13 LAB — POCT I-STAT, CHEM 8
BUN: 10 mg/dL (ref 8–23)
BUN: 10 mg/dL (ref 8–23)
BUN: 9 mg/dL (ref 8–23)
Calcium, Ion: 1.18 mmol/L (ref 1.15–1.40)
Calcium, Ion: 1.14 mmol/L — ABNORMAL LOW (ref 1.15–1.40)
Calcium, Ion: 1.24 mmol/L (ref 1.15–1.40)
Chloride: 106 mmol/L (ref 98–111)
Chloride: 106 mmol/L (ref 98–111)
Chloride: 107 mmol/L (ref 98–111)
Creatinine, Ser: 0.7 mg/dL (ref 0.44–1.00)
Creatinine, Ser: 0.7 mg/dL (ref 0.44–1.00)
Creatinine, Ser: 0.7 mg/dL (ref 0.44–1.00)
Glucose, Bld: 155 mg/dL — ABNORMAL HIGH (ref 70–99)
Glucose, Bld: 142 mg/dL — ABNORMAL HIGH (ref 70–99)
Glucose, Bld: 161 mg/dL — ABNORMAL HIGH (ref 70–99)
HCT: 33 % — ABNORMAL LOW (ref 36.0–46.0)
HCT: 31 % — ABNORMAL LOW (ref 36.0–46.0)
HCT: 31 % — ABNORMAL LOW (ref 36.0–46.0)
Hemoglobin: 11.2 g/dL — ABNORMAL LOW (ref 12.0–15.0)
Hemoglobin: 10.5 g/dL — ABNORMAL LOW (ref 12.0–15.0)
Hemoglobin: 10.5 g/dL — ABNORMAL LOW (ref 12.0–15.0)
Potassium: 3.1 mmol/L — ABNORMAL LOW (ref 3.5–5.1)
Potassium: 3 mmol/L — ABNORMAL LOW (ref 3.5–5.1)
Potassium: 3.1 mmol/L — ABNORMAL LOW (ref 3.5–5.1)
Sodium: 143 mmol/L (ref 135–145)
Sodium: 141 mmol/L (ref 135–145)
Sodium: 143 mmol/L (ref 135–145)
TCO2: 24 mmol/L (ref 22–32)
TCO2: 25 mmol/L (ref 22–32)
TCO2: 27 mmol/L (ref 22–32)

## 2021-08-13 SURGERY — IMPLANTATION, AORTIC VALVE, TRANSCATHETER, CAROTID ARTERY APPROACH
Anesthesia: General | Laterality: Right

## 2021-08-13 MED ORDER — CHLORHEXIDINE GLUCONATE 4 % EX LIQD: 60.0000 mL | Freq: Once | CUTANEOUS | Status: DC

## 2021-08-13 MED ORDER — PROPOFOL 10 MG/ML IV BOLUS
INTRAVENOUS | Status: AC
  Filled 2021-08-13: qty 20

## 2021-08-13 MED ORDER — FENTANYL CITRATE (PF) 250 MCG/5ML IJ SOLN
INTRAMUSCULAR | Status: DC | PRN
  Administered 2021-08-13: 50 ug via INTRAVENOUS
  Administered 2021-08-13: 25 ug via INTRAVENOUS
  Administered 2021-08-13: 50 ug via INTRAVENOUS

## 2021-08-13 MED ORDER — HEPARIN 6000 UNIT IRRIGATION SOLUTION
Status: AC
  Filled 2021-08-13: qty 1500

## 2021-08-13 MED ORDER — ROCURONIUM BROMIDE 10 MG/ML (PF) SYRINGE
PREFILLED_SYRINGE | INTRAVENOUS | Status: DC | PRN
  Administered 2021-08-13: 50 mg via INTRAVENOUS
  Administered 2021-08-13: 10 mg via INTRAVENOUS

## 2021-08-13 MED ORDER — OXYCODONE HCL 5 MG PO TABS
5.0000 mg | ORAL_TABLET | Freq: Once | ORAL | Status: AC | PRN
  Administered 2021-08-13: 5 mg via ORAL

## 2021-08-13 MED ORDER — DEXAMETHASONE SODIUM PHOSPHATE 10 MG/ML IJ SOLN
INTRAMUSCULAR | Status: DC | PRN
  Administered 2021-08-13: 5 mg via INTRAVENOUS

## 2021-08-13 MED ORDER — OXYCODONE HCL 5 MG PO TABS
5.0000 mg | ORAL_TABLET | ORAL | Status: DC | PRN
  Administered 2021-08-13 (×2): 10 mg via ORAL
  Filled 2021-08-13 (×2): qty 2

## 2021-08-13 MED ORDER — TRAZODONE HCL 50 MG PO TABS
100.0000 mg | ORAL_TABLET | Freq: Every day | ORAL | Status: DC
  Administered 2021-08-13 – 2021-08-15 (×3): 100 mg via ORAL
  Filled 2021-08-13 (×3): qty 2

## 2021-08-13 MED ORDER — CHLORHEXIDINE GLUCONATE 0.12 % MT SOLN
15.0000 mL | Freq: Once | OROMUCOSAL | Status: AC
  Administered 2021-08-13: 15 mL via OROMUCOSAL
  Filled 2021-08-13: qty 15

## 2021-08-13 MED ORDER — NITROGLYCERIN IN D5W 200-5 MCG/ML-% IV SOLN: 0.0000 ug/min | INTRAVENOUS | Status: DC

## 2021-08-13 MED ORDER — ASPIRIN 81 MG PO CHEW
81.0000 mg | CHEWABLE_TABLET | Freq: Every day | ORAL | Status: DC
  Administered 2021-08-14 – 2021-08-19 (×6): 81 mg via ORAL
  Filled 2021-08-13 (×6): qty 1

## 2021-08-13 MED ORDER — LIDOCAINE 2% (20 MG/ML) 5 ML SYRINGE
INTRAMUSCULAR | Status: AC
  Filled 2021-08-13: qty 5

## 2021-08-13 MED ORDER — POTASSIUM CHLORIDE 10 MEQ/100ML IV SOLN
10.0000 meq | INTRAVENOUS | Status: AC
  Administered 2021-08-13 (×6): 10 meq via INTRAVENOUS
  Filled 2021-08-13 (×5): qty 100

## 2021-08-13 MED ORDER — SUGAMMADEX SODIUM 200 MG/2ML IV SOLN
INTRAVENOUS | Status: DC | PRN
Start: 2021-08-13 — End: 2021-08-13
  Administered 2021-08-13: 100 mg via INTRAVENOUS

## 2021-08-13 MED ORDER — ONDANSETRON HCL 4 MG/2ML IJ SOLN
INTRAMUSCULAR | Status: DC | PRN
  Administered 2021-08-13: 4 mg via INTRAVENOUS

## 2021-08-13 MED ORDER — SODIUM CHLORIDE 0.9 % IV SOLN: INTRAVENOUS | Status: AC

## 2021-08-13 MED ORDER — LIDOCAINE 2% (20 MG/ML) 5 ML SYRINGE
INTRAMUSCULAR | Status: DC | PRN
  Administered 2021-08-13: 50 mg via INTRAVENOUS

## 2021-08-13 MED ORDER — SODIUM CHLORIDE 0.9 % IV SOLN: 250.0000 mL | INTRAVENOUS | Status: DC | PRN

## 2021-08-13 MED ORDER — FENTANYL CITRATE (PF) 100 MCG/2ML IJ SOLN
25.0000 ug | INTRAMUSCULAR | Status: DC | PRN
  Administered 2021-08-13 (×4): 25 ug via INTRAVENOUS

## 2021-08-13 MED ORDER — PHENYLEPHRINE HCL-NACL 20-0.9 MG/250ML-% IV SOLN: 0.0000 ug/min | INTRAVENOUS | Status: DC

## 2021-08-13 MED ORDER — SUGAMMADEX SODIUM 500 MG/5ML IV SOLN
INTRAVENOUS | Status: AC
  Filled 2021-08-13: qty 5

## 2021-08-13 MED ORDER — OXYCODONE HCL 5 MG PO TABS
ORAL_TABLET | ORAL | Status: AC
  Filled 2021-08-13: qty 1

## 2021-08-13 MED ORDER — ONDANSETRON HCL 4 MG/2ML IJ SOLN
4.0000 mg | Freq: Four times a day (QID) | INTRAMUSCULAR | Status: DC | PRN
  Administered 2021-08-13: 4 mg via INTRAVENOUS
  Filled 2021-08-13: qty 2

## 2021-08-13 MED ORDER — CLEVIDIPINE BUTYRATE 0.5 MG/ML IV EMUL
0.0000 mg/h | INTRAVENOUS | Status: DC
  Administered 2021-08-13: 2 mg/h via INTRAVENOUS
  Filled 2021-08-13 (×2): qty 50

## 2021-08-13 MED ORDER — ESCITALOPRAM OXALATE 10 MG PO TABS
5.0000 mg | ORAL_TABLET | Freq: Every day | ORAL | Status: DC
  Administered 2021-08-14 – 2021-08-19 (×6): 5 mg via ORAL
  Filled 2021-08-13 (×6): qty 1

## 2021-08-13 MED ORDER — VANCOMYCIN HCL IN DEXTROSE 1-5 GM/200ML-% IV SOLN
1000.0000 mg | Freq: Once | INTRAVENOUS | Status: AC
  Administered 2021-08-13: 1000 mg via INTRAVENOUS
  Filled 2021-08-13: qty 200

## 2021-08-13 MED ORDER — LACTATED RINGERS IV SOLN: INTRAVENOUS | Status: DC | PRN

## 2021-08-13 MED ORDER — PANTOPRAZOLE SODIUM 40 MG PO TBEC
40.0000 mg | DELAYED_RELEASE_TABLET | Freq: Every day | ORAL | Status: DC
  Administered 2021-08-14 – 2021-08-19 (×6): 40 mg via ORAL
  Filled 2021-08-13 (×6): qty 1

## 2021-08-13 MED ORDER — SODIUM CHLORIDE 0.9 % IV SOLN
250.0000 mL | INTRAVENOUS | Status: DC
  Administered 2021-08-14: 250 mL via INTRAVENOUS

## 2021-08-13 MED ORDER — TRAMADOL HCL 50 MG PO TABS
50.0000 mg | ORAL_TABLET | ORAL | Status: DC | PRN
  Administered 2021-08-13: 50 mg via ORAL
  Filled 2021-08-13: qty 2

## 2021-08-13 MED ORDER — CHLORHEXIDINE GLUCONATE 4 % EX LIQD: 30.0000 mL | CUTANEOUS | Status: DC

## 2021-08-13 MED ORDER — FENTANYL CITRATE (PF) 100 MCG/2ML IJ SOLN
INTRAMUSCULAR | Status: AC
  Filled 2021-08-13: qty 2

## 2021-08-13 MED ORDER — PROPOFOL 10 MG/ML IV BOLUS
INTRAVENOUS | Status: DC | PRN
  Administered 2021-08-13: 10 mg via INTRAVENOUS
  Administered 2021-08-13: 100 mg via INTRAVENOUS

## 2021-08-13 MED ORDER — ROCURONIUM BROMIDE 10 MG/ML (PF) SYRINGE
PREFILLED_SYRINGE | INTRAVENOUS | Status: AC
  Filled 2021-08-13: qty 10

## 2021-08-13 MED ORDER — ALBUMIN HUMAN 5 % IV SOLN
12.5000 g | Freq: Once | INTRAVENOUS | Status: AC
  Administered 2021-08-13: 12.5 g via INTRAVENOUS

## 2021-08-13 MED ORDER — 0.9 % SODIUM CHLORIDE (POUR BTL) OPTIME
TOPICAL | Status: DC | PRN
  Administered 2021-08-13: 1000 mL

## 2021-08-13 MED ORDER — ALPRAZOLAM 0.25 MG PO TABS
0.2500 mg | ORAL_TABLET | Freq: Every day | ORAL | Status: DC
  Administered 2021-08-13 – 2021-08-18 (×6): 0.25 mg via ORAL
  Filled 2021-08-13 (×6): qty 1

## 2021-08-13 MED ORDER — ACETAMINOPHEN 650 MG RE SUPP: 650.0000 mg | Freq: Four times a day (QID) | RECTAL | Status: DC | PRN

## 2021-08-13 MED ORDER — ONDANSETRON HCL 4 MG/2ML IJ SOLN
INTRAMUSCULAR | Status: AC
  Filled 2021-08-13: qty 2

## 2021-08-13 MED ORDER — HEPARIN SODIUM (PORCINE) 1000 UNIT/ML IJ SOLN
INTRAMUSCULAR | Status: AC
  Filled 2021-08-13: qty 1

## 2021-08-13 MED ORDER — HEPARIN SODIUM (PORCINE) 1000 UNIT/ML IJ SOLN
INTRAMUSCULAR | Status: AC
  Filled 2021-08-13: qty 10

## 2021-08-13 MED ORDER — FENTANYL CITRATE (PF) 250 MCG/5ML IJ SOLN
INTRAMUSCULAR | Status: AC
  Filled 2021-08-13: qty 5

## 2021-08-13 MED ORDER — PHENYLEPHRINE 40 MCG/ML (10ML) SYRINGE FOR IV PUSH (FOR BLOOD PRESSURE SUPPORT)
PREFILLED_SYRINGE | INTRAVENOUS | Status: DC | PRN
  Administered 2021-08-13: 80 ug via INTRAVENOUS
  Administered 2021-08-13: 40 ug via INTRAVENOUS
  Administered 2021-08-13: 120 ug via INTRAVENOUS
  Administered 2021-08-13: 80 ug via INTRAVENOUS

## 2021-08-13 MED ORDER — IODIXANOL 320 MG/ML IV SOLN
INTRAVENOUS | Status: DC | PRN
  Administered 2021-08-13: 40 mL via INTRA_ARTERIAL

## 2021-08-13 MED ORDER — HEPARIN SODIUM (PORCINE) 1000 UNIT/ML IJ SOLN
INTRAMUSCULAR | Status: DC | PRN
  Administered 2021-08-13: 6000 [IU] via INTRAVENOUS

## 2021-08-13 MED ORDER — OXYCODONE HCL 5 MG/5ML PO SOLN: 5.0000 mg | Freq: Once | ORAL | Status: AC | PRN

## 2021-08-13 MED ORDER — DEXAMETHASONE SODIUM PHOSPHATE 10 MG/ML IJ SOLN
INTRAMUSCULAR | Status: AC
  Filled 2021-08-13: qty 1

## 2021-08-13 MED ORDER — FENTANYL CITRATE PF 50 MCG/ML IJ SOSY: 12.5000 ug | PREFILLED_SYRINGE | Freq: Once | INTRAMUSCULAR | Status: DC

## 2021-08-13 MED ORDER — ALBUTEROL SULFATE (2.5 MG/3ML) 0.083% IN NEBU
2.5000 mg | INHALATION_SOLUTION | RESPIRATORY_TRACT | Status: DC | PRN
  Administered 2021-08-13 – 2021-08-18 (×4): 2.5 mg via RESPIRATORY_TRACT
  Filled 2021-08-13 (×4): qty 3

## 2021-08-13 MED ORDER — SODIUM CHLORIDE 0.9 % IV SOLN: INTRAVENOUS | Status: DC

## 2021-08-13 MED ORDER — ALBUMIN HUMAN 5 % IV SOLN
INTRAVENOUS | Status: AC
  Filled 2021-08-13: qty 250

## 2021-08-13 MED ORDER — HEPARIN 6000 UNIT IRRIGATION SOLUTION
Status: DC | PRN
  Administered 2021-08-13: 3

## 2021-08-13 MED ORDER — MELATONIN 5 MG PO TABS
10.0000 mg | ORAL_TABLET | Freq: Every day | ORAL | Status: DC
Start: 2021-08-13 — End: 2021-08-19
  Administered 2021-08-13 – 2021-08-18 (×5): 10 mg via ORAL
  Filled 2021-08-13 (×6): qty 2

## 2021-08-13 MED ORDER — SODIUM CHLORIDE 0.9% FLUSH: 3.0000 mL | INTRAVENOUS | Status: DC | PRN

## 2021-08-13 MED ORDER — ONDANSETRON HCL 4 MG/2ML IJ SOLN: 4.0000 mg | Freq: Four times a day (QID) | INTRAMUSCULAR | Status: DC | PRN

## 2021-08-13 MED ORDER — SODIUM CHLORIDE 0.9% FLUSH
3.0000 mL | Freq: Two times a day (BID) | INTRAVENOUS | Status: DC
  Administered 2021-08-13 – 2021-08-17 (×8): 3 mL via INTRAVENOUS

## 2021-08-13 MED ORDER — ALBUMIN HUMAN 5 % IV SOLN: INTRAVENOUS | Status: DC | PRN

## 2021-08-13 MED ORDER — PROTAMINE SULFATE 10 MG/ML IV SOLN
INTRAVENOUS | Status: DC | PRN
  Administered 2021-08-13: 60 mg via INTRAVENOUS

## 2021-08-13 MED ORDER — POTASSIUM CHLORIDE 10 MEQ/100ML IV SOLN
10.0000 meq | INTRAVENOUS | Status: DC
  Filled 2021-08-13: qty 100

## 2021-08-13 MED ORDER — MORPHINE SULFATE (PF) 2 MG/ML IV SOLN
1.0000 mg | INTRAVENOUS | Status: DC | PRN
  Administered 2021-08-13: 4 mg via INTRAVENOUS
  Administered 2021-08-13: 2 mg via INTRAVENOUS
  Administered 2021-08-13: 4 mg via INTRAVENOUS
  Filled 2021-08-13: qty 1
  Filled 2021-08-13 (×2): qty 2

## 2021-08-13 MED ORDER — ACETAMINOPHEN 325 MG PO TABS: 650.0000 mg | ORAL_TABLET | Freq: Four times a day (QID) | ORAL | Status: DC | PRN

## 2021-08-13 SURGICAL SUPPLY — 94 items
ADH SKN CLS APL DERMABOND .7 (GAUZE/BANDAGES/DRESSINGS) ×6
BAG COUNTER SPONGE SURGICOUNT (BAG) ×4 IMPLANT
BAG DECANTER FOR FLEXI CONT (MISCELLANEOUS) IMPLANT
BAG SNAP BAND KOVER 36X36 (MISCELLANEOUS) ×4 IMPLANT
BAG SPNG CNTER NS LX DISP (BAG) ×3
BLADE CLIPPER SURG (BLADE) IMPLANT
BLADE OSCILLATING /SAGITTAL (BLADE) IMPLANT
BLADE STERNUM SYSTEM 6 (BLADE) IMPLANT
CABLE ADAPT CONN TEMP 6FT (ADAPTER) ×4 IMPLANT
CANNULA FEM VENOUS REMOTE 22FR (CANNULA) IMPLANT
CANNULA OPTISITE PERFUSION 16F (CANNULA) IMPLANT
CANNULA OPTISITE PERFUSION 18F (CANNULA) IMPLANT
CATH DIAG EXPO 6F AL1 (CATHETERS) ×1 IMPLANT
CATH DIAG EXPO 6F VENT PIG 145 (CATHETERS) ×8 IMPLANT
CATH EXTERNAL FEMALE PUREWICK (CATHETERS) IMPLANT
CATH INFINITI 6F AL2 (CATHETERS) IMPLANT
CATH S G BIP PACING (CATHETERS) ×4 IMPLANT
CATH THORACIC 36FR RT ANG (CATHETERS) ×1 IMPLANT
CLIP VESOCCLUDE MED 24/CT (CLIP) IMPLANT
CLIP VESOCCLUDE SM WIDE 24/CT (CLIP) IMPLANT
CNTNR URN SCR LID CUP LEK RST (MISCELLANEOUS) ×6 IMPLANT
CONT SPEC 4OZ STRL OR WHT (MISCELLANEOUS) ×8
COVER BACK TABLE 80X110 HD (DRAPES) ×8 IMPLANT
COVER DOME SNAP 22 D (MISCELLANEOUS) IMPLANT
DERMABOND ADVANCED (GAUZE/BANDAGES/DRESSINGS) ×2
DERMABOND ADVANCED .7 DNX12 (GAUZE/BANDAGES/DRESSINGS) ×3 IMPLANT
DRAPE INCISE IOBAN 66X45 STRL (DRAPES) IMPLANT
DRSG TEGADERM 4X4.75 (GAUZE/BANDAGES/DRESSINGS) ×8 IMPLANT
ELECT CAUTERY BLADE 6.4 (BLADE) IMPLANT
ELECT REM PT RETURN 9FT ADLT (ELECTROSURGICAL) ×8
ELECTRODE REM PT RTRN 9FT ADLT (ELECTROSURGICAL) ×6 IMPLANT
FELT TEFLON 6X6 (MISCELLANEOUS) IMPLANT
FEMORAL VENOUS CANN RAP (CANNULA) IMPLANT
GAUZE SPONGE 4X4 12PLY STRL (GAUZE/BANDAGES/DRESSINGS) ×4 IMPLANT
GLOVE EUDERMIC 7 POWDERFREE (GLOVE) IMPLANT
GLOVE SURG ENC MOIS LTX SZ7.5 (GLOVE) IMPLANT
GLOVE SURG ENC MOIS LTX SZ8 (GLOVE) IMPLANT
GLOVE SURG ORTHO LTX SZ7.5 (GLOVE) IMPLANT
GOWN STRL REUS W/ TWL LRG LVL3 (GOWN DISPOSABLE) IMPLANT
GOWN STRL REUS W/ TWL XL LVL3 (GOWN DISPOSABLE) ×3 IMPLANT
GOWN STRL REUS W/TWL LRG LVL3 (GOWN DISPOSABLE)
GOWN STRL REUS W/TWL XL LVL3 (GOWN DISPOSABLE) ×4
GUIDEWIRE SAFE TJ AMPLATZ EXST (WIRE) ×4 IMPLANT
INSERT FOGARTY SM (MISCELLANEOUS) IMPLANT
KIT BASIN OR (CUSTOM PROCEDURE TRAY) ×4 IMPLANT
KIT DILATOR VASC 18G NDL (KITS) IMPLANT
KIT HEART LEFT (KITS) ×4 IMPLANT
KIT SAPIAN 3 ULTRA RESILIA 23 (Valve) ×1 IMPLANT
KIT SUCTION CATH 14FR (SUCTIONS) ×1 IMPLANT
KIT TURNOVER KIT B (KITS) ×4 IMPLANT
LOOP VESSEL MAXI BLUE (MISCELLANEOUS) IMPLANT
LOOP VESSEL MINI RED (MISCELLANEOUS) IMPLANT
NDL PERC 18GX7CM (NEEDLE) ×3 IMPLANT
NEEDLE 22X1 1/2 (OR ONLY) (NEEDLE) IMPLANT
NEEDLE PERC 18GX7CM (NEEDLE) ×4 IMPLANT
NS IRRIG 1000ML POUR BTL (IV SOLUTION) ×12 IMPLANT
PACK ENDOVASCULAR (PACKS) ×4 IMPLANT
PAD ARMBOARD 7.5X6 YLW CONV (MISCELLANEOUS) ×8 IMPLANT
PAD ELECT DEFIB RADIOL ZOLL (MISCELLANEOUS) ×4 IMPLANT
PENCIL BUTTON HOLSTER BLD 10FT (ELECTRODE) ×4 IMPLANT
POSITIONER HEAD DONUT 9IN (MISCELLANEOUS) ×4 IMPLANT
SHEATH BRITE TIP 7FR 35CM (SHEATH) ×4 IMPLANT
SHEATH PINNACLE 6F 10CM (SHEATH) ×4 IMPLANT
SHEATH PINNACLE 8F 10CM (SHEATH) ×4 IMPLANT
SLEEVE REPOSITIONING LENGTH 30 (MISCELLANEOUS) ×4 IMPLANT
SPONGE T-LAP 4X18 ~~LOC~~+RFID (SPONGE) IMPLANT
STOPCOCK MORSE 400PSI 3WAY (MISCELLANEOUS) ×8 IMPLANT
SUT BONE WAX W31G (SUTURE) ×1 IMPLANT
SUT ETHIBOND X763 2 0 SH 1 (SUTURE) IMPLANT
SUT GORETEX CV 4 TH 22 36 (SUTURE) IMPLANT
SUT GORETEX CV4 TH-18 (SUTURE) IMPLANT
SUT MNCRL AB 3-0 PS2 18 (SUTURE) IMPLANT
SUT PROLENE 5 0 C 1 36 (SUTURE) IMPLANT
SUT PROLENE 6 0 C 1 30 (SUTURE) IMPLANT
SUT SILK  1 MH (SUTURE) ×4
SUT SILK 1 MH (SUTURE) ×3 IMPLANT
SUT VIC AB 0 CT1 18XCR BRD 8 (SUTURE) IMPLANT
SUT VIC AB 0 CT1 8-18 (SUTURE) ×4
SUT VIC AB 2-0 CT1 27 (SUTURE)
SUT VIC AB 2-0 CT1 TAPERPNT 27 (SUTURE) IMPLANT
SUT VIC AB 2-0 CTX 36 (SUTURE) IMPLANT
SUT VIC AB 3-0 SH 27 (SUTURE) ×4
SUT VIC AB 3-0 SH 27X BRD (SUTURE) IMPLANT
SUT VIC AB 3-0 SH 8-18 (SUTURE) IMPLANT
SYR 50ML LL SCALE MARK (SYRINGE) ×4 IMPLANT
SYR BULB IRRIG 60ML STRL (SYRINGE) IMPLANT
SYR CONTROL 10ML LL (SYRINGE) IMPLANT
SYSTEM SAHARA CHEST DRAIN ATS (WOUND CARE) ×1 IMPLANT
TOWEL GREEN STERILE (TOWEL DISPOSABLE) ×4 IMPLANT
TOWEL GREEN STERILE FF (TOWEL DISPOSABLE) ×4 IMPLANT
TRANSDUCER W/STOPCOCK (MISCELLANEOUS) ×8 IMPLANT
TRAY FOLEY SLVR 16FR TEMP STAT (SET/KITS/TRAYS/PACK) IMPLANT
WIRE EMERALD 3MM-J .035X150CM (WIRE) ×4 IMPLANT
WIRE EMERALD 3MM-J .035X260CM (WIRE) ×4 IMPLANT

## 2021-08-13 NOTE — Progress Notes (Signed)
°  Echocardiogram Echocardiogram Transesophageal has been performed.  Bobbye Charleston 08/13/2021, 10:18 AM

## 2021-08-13 NOTE — Progress Notes (Signed)
°  Transition of Care [facility]) Screening Note   Patient Details  Name: Katherine [name] Date of Birth: 1928/08/31   [facility]) CM/SW Contact:    Milas Gain, [place] Phone Number: 08/13/2021, 5:00 PM    [facility]) has reviewed patient and no TOC needs have been identified at this time. We will continue to monitor patient advancement through interdisciplinary progression rounds. If new patient transition needs arise, please place a TOC consult.

## 2021-08-13 NOTE — Progress Notes (Signed)
°  [facility] VALVE TEAM  Patient doing well s/p TAVR. She is hemodynamically stable, recovering in the PACU. Carotid site and groin sites are stable. Subxiphoid initiation looks but painful for her. She is being treated with fentanyl. K was 3.0 and this has been supplemented. This has been supplemented. ECG with no high grade block. Keep chest tube at 20cm.  Plan for ambulation when chest tube is removed.   Angelena Form PA-C  [facility]  Pager [phone]

## 2021-08-13 NOTE — Discharge Instructions (Signed)

## 2021-08-13 NOTE — Anesthesia Procedure Notes (Signed)
Procedure Name: Intubation Date/Time: 08/13/2021 7:40 AM Performed by: Barrington Ellison, CRNA Pre-anesthesia Checklist: Patient identified, Emergency Drugs available, Suction available and Patient being monitored Patient Re-evaluated:Patient Re-evaluated prior to induction Oxygen Delivery Method: Circle System Utilized Preoxygenation: Pre-oxygenation with 100% oxygen Induction Type: IV induction Ventilation: Mask ventilation without difficulty and Oral airway inserted - appropriate to patient size Laryngoscope Size: Mac and 3 Grade View: Grade I Tube type: Oral Tube size: 7.0 mm Number of attempts: 1 Airway Equipment and Method: Stylet and Oral airway Placement Confirmation: ETT inserted through vocal cords under direct vision, positive ETCO2 and breath sounds checked- equal and bilateral Secured at: 22 cm Tube secured with: Tape Dental Injury: Teeth and Oropharynx as per pre-operative assessment  Comments: Placed by Lucita Ferrara, SRNA under supervision of MDA and CRNA

## 2021-08-13 NOTE — Anesthesia Procedure Notes (Signed)
Arterial Line Insertion Start/End2/28/2023 6:45 AM, 08/13/2021 6:55 AM Performed by: Barrington Ellison, CRNA, CRNA  Patient location: Pre-op. Preanesthetic checklist: patient identified, risks and benefits discussed and surgical consent Lidocaine 1% used for infiltration Left, radial was placed Catheter size: 20 G Hand hygiene performed   Attempts: 1 Procedure performed without using ultrasound guided technique. Following insertion, Biopatch and dressing applied. Post procedure assessment: normal  Patient tolerated the procedure well with no immediate complications. Additional procedure comments: Placed by Lucita Ferrara, SRNA under supervision of CRNA.

## 2021-08-13 NOTE — Interval H&P Note (Signed)
History and Physical Interval Note:  08/13/2021 6:39 AM  Katherine [name]  has presented today for surgery, with the diagnosis of Severe Aortic Stenosis.  The various methods of treatment have been discussed with the patient and family. After consideration of risks, benefits and other options for treatment, the patient has consented to  Procedure(s): Transcatheter Aortic Valve Replacement-Carotid (Right) TRANSESOPHAGEAL ECHOCARDIOGRAM (TEE) (N/A) as a surgical intervention.  The patient's history has been reviewed, patient examined, no change in status, stable for surgery.  I have reviewed the patient's chart and labs.  Questions were answered to the patient's satisfaction.     Gaye Pollack

## 2021-08-13 NOTE — CV Procedure (Signed)
[facility]  TAVR OPERATIVE NOTE   Date of Procedure:  08/13/2021  Preoperative Diagnosis: Severe Aortic Stenosis   Postoperative Diagnosis: Same   Procedure:   Transcatheter Aortic Valve Replacement - TransCarotid Approach  Edwards Sapien 3 THV (size 23 mm, model # [id number], serial # [id number])   Co-Surgeons:  Lauree Chandler, MD and Gaye Pollack, MD   Anesthesiologist:  Hodierne  Echocardiographer:  Gasper Sells  Pre-operative Echo Findings: Severe aortic stenosis Normal left ventricular systolic function  Post-operative Echo Findings: No paravalvular leak Normal left ventricular systolic function Large pericardial effusion  BRIEF CLINICAL NOTE AND INDICATIONS FOR SURGERY  88 yo female with history of prior etoh abuse, anemia, arthritis, COPD, former tobacco abuse (stopped smoking in 2011), hyperlipidemia, IBS and severe aortic stenosis who is here today for TAVR. She has been followed for moderate aortic stenosis by Dr. Renold Genta. Echo 05/21/21 with LVEF=60-65%, mild LVH. Degenerative mitral valve with mild mitral stenosis, trivial MR, moderate MAC. The aortic valve leaflets are thickened and calcified with poor leaflet mobility. Severe aortic stenosis with mean gradient 33.7 mmHg, peak gradient 57 mmHg, AVA 0.62 cm2, DI 0.20. Cardiac cath with mild non-obstructive CAD.   During the course of the patient's preoperative work up they have been evaluated comprehensively by a multidisciplinary team of specialists coordinated through the [facility] in the [facility].  They have been demonstrated to suffer from symptomatic severe aortic stenosis as noted above. The patient has been counseled extensively as to the relative risks and benefits of all options for the treatment of severe aortic stenosis including long term medical therapy, conventional surgery for aortic valve replacement, and transcatheter aortic valve  replacement.  The patient has been independently evaluated by Dr. Cyndia Bent with CT surgery and they are felt to be at high risk for conventional surgical aortic valve replacement. The surgeon indicated the patient would be a poor candidate for conventional surgery. Based upon review of all of the patient's preoperative diagnostic tests they are felt to be candidate for transcatheter aortic valve replacement using the transfemoral approach as an alternative to high risk conventional surgery.    Following the decision to proceed with transcatheter aortic valve replacement, a discussion has been held regarding what types of management strategies would be attempted intraoperatively in the event of life-threatening complications, including whether or not the patient would be considered a candidate for the use of cardiopulmonary bypass and/or conversion to open sternotomy for attempted surgical intervention.  The patient has been advised of a variety of complications that might develop peculiar to this approach including but not limited to risks of death, stroke, paravalvular leak, aortic dissection or other major vascular complications, aortic annulus rupture, device embolization, cardiac rupture or perforation, acute myocardial infarction, arrhythmia, heart block or bradycardia requiring permanent pacemaker placement, congestive heart failure, respiratory failure, renal failure, pneumonia, infection, other late complications related to structural valve deterioration or migration, or other complications that might ultimately cause a temporary or permanent loss of functional independence or other long term morbidity.  The patient provides full informed consent for the procedure as described and all questions were answered preoperatively.    DETAILS OF THE OPERATIVE PROCEDURE  PREPARATION:   The patient is brought to the operating room on the above mentioned date and central monitoring was established by the  anesthesia team including placement of a radial arterial line. The patient is placed in the supine position on the operating table.  Intravenous  antibiotics are administered. Conscious sedation is used.   Baseline transesophageal echocardiogram was performed. The patient's chest, abdomen, both groins, and both lower extremities are prepared and draped in a sterile manner. A time out procedure is performed.   PERIPHERAL ACCESS:   Using the modified Seldinger technique, femoral arterial and venous access were obtained with placement of a 6 Fr sheath in the artery and a 7 Fr sheath in the vein on the right side using u/s guidance.  A pigtail diagnostic catheter was passed through the femoral arterial sheath under fluoroscopic guidance into the aortic root.  A temporary transvenous pacemaker catheter was passed through the femoral venous sheath under fluoroscopic guidance into the right ventricle.  The pacemaker was tested to ensure stable lead placement and pacemaker capture. Aortic root angiography was performed in order to determine the optimal angiographic angle for valve deployment.  TRANSCAROTID ARTERY ACCESS: See Dr. Vivi Martens operative note for right carotid access.    The carotid artery was accessed with a needle and a J wire was advanced into the ascending aorta. A 8 French sheath was placed. I then crossed the aortic valve with an AL-1 catheter and a straight wire. I then exchanged for a long J wire and  pigtail catheter. The Extrastiff wire was then advanced into the LV. 30 Fr transfemoral E-sheath was introduced into the carotid artery.    TRANSCATHETER HEART VALVE DEPLOYMENT:  An Edwards Sapien 3 THV (size 23 mm) was prepared and crimped per manufacturer's guidelines, and the proper orientation of the valve is confirmed on the [employer] delivery system. The valve was advanced through the introducer sheath using normal technique until in an appropriate position in the ascending aorta  beyond the sheath tip. The balloon was then retracted and using the fine-tuning wheel was centered on the valve. The valve was then advanced across the aortic arch using appropriate flexion of the catheter. The valve was carefully positioned across the aortic valve annulus. The Commander catheter was retracted using normal technique. Once final position of the valve has been confirmed by angiographic assessment, the valve is deployed while temporarily holding ventilation and during rapid ventricular pacing to maintain systolic blood pressure < 50 mmHg and pulse pressure < 10 mmHg. The balloon inflation is held for >3 seconds after reaching full deployment volume. Once the balloon has fully deflated the balloon is retracted into the ascending aorta and valve function is assessed using TEE. There is felt to be no paravalvular leak and no central aortic insufficiency.  Pericardial effusion noted on TEE. Dr. Cyndia Bent then performed a pericardial window with placement of a pericardial drain. Please see his note for details. The patient's hemodynamic recovery following removal of pericardial effusion was rapid. Echo demostrated acceptable post-procedural gradients, stable mitral valve function, and no AI. Small residual pericardial effusion.   PROCEDURE COMPLETION:  The sheath was then removed and closure devices were completed. Protamine was administered once femoral arterial repair was complete. The temporary pacemaker, pigtail catheters and femoral sheaths were removed with a Mynx closure device placed in the artery and manual pressure used for venous hemostasis.    The patient tolerated the procedure well and is transported to the surgical intensive care in stable condition. All sponge instrument and needle counts are verified correct at completion of the operation.   No blood products were administered during the operation.  The patient received a total of 40 mL of intravenous contrast during the  procedure.  Lauree Chandler MD 08/13/2021 9:46 AM

## 2021-08-13 NOTE — Anesthesia Preprocedure Evaluation (Addendum)
Anesthesia Evaluation  Patient identified by MRN, date of birth, ID band Patient awake    Reviewed: Allergy & Precautions, H&P , NPO status , Patient's Chart, lab work & pertinent test results  History of Anesthesia Complications (+) PONV and history of anesthetic complications  Airway Mallampati: II   Neck ROM: full    Dental   Pulmonary shortness of breath, asthma , COPD, former smoker,    breath sounds clear to auscultation       Cardiovascular + Valvular Problems/Murmurs AS  Rhythm:regular Rate:Normal  TTE (05/2021): EF 65%, severe AS with AVA 0.6 cm2.   Neuro/Psych  Headaches, PSYCHIATRIC DISORDERS Anxiety Depression    GI/Hepatic hiatal hernia, PUD, GERD  ,  Endo/Other    Renal/GU      Musculoskeletal  (+) Arthritis ,   Abdominal   Peds  Hematology   Anesthesia Other Findings   Reproductive/Obstetrics                            Anesthesia Physical Anesthesia Plan  ASA: 3  Anesthesia Plan: General   Post-op Pain Management:    Induction: Intravenous  PONV Risk Score and Plan: 4 or greater and Ondansetron, Dexamethasone and Treatment may vary due to age or medical condition  Airway Management Planned: Oral ETT  Additional Equipment: Arterial line and TEE  Intra-op Plan:   Post-operative Plan: Extubation in OR  Informed Consent: I have reviewed the patients History and Physical, chart, labs and discussed the procedure including the risks, benefits and alternatives for the proposed anesthesia with the patient or authorized representative who has indicated his/her understanding and acceptance.     Dental advisory given  Plan Discussed with: CRNA, Anesthesiologist and Surgeon  Anesthesia Plan Comments:         Anesthesia Quick Evaluation

## 2021-08-13 NOTE — Progress Notes (Signed)
°  [facility]   MULTIDISCIPLINARY HEART VALVE TEAM  Checked in on patient. She is still uncomfortable. Husband at bedside. Chest tube has put out 92 cc's. Continue pain management. Will order CXR tomorrow and hopefully remove tomorrow am.   Angelena Form PA-C  [facility]  [phone]

## 2021-08-13 NOTE — Progress Notes (Signed)
Dr. Angelena Form made aware patient unable to give urine specimen for U/A that was ordered. Patient states she voided this morning prior to coming to Short Stay. Ok to proceed per Dr. Angelena Form.

## 2021-08-13 NOTE — Progress Notes (Signed)
Patient ID: Katherine [name], female   DOB: 1929-02-03, 88 y.o.   MRN: [id number]  TCTS Evening Rounds:  Hemodynamically stable postop in sinus rhythm.  Chest tube output 200 cc since surgery, thin bloody fluid.  Sats 95% on [place].  Has had a lot of pain from epigastric incision and pain in back.  Awake, alert, conversant. Neuro intact. Husband in room with her.

## 2021-08-13 NOTE — Op Note (Signed)
[facility]   MULTIDISCIPLINARY HEART VALVE TEAM   TAVR OPERATIVE NOTE   Date of Procedure:  08/13/2021  Preoperative Diagnosis: Severe Aortic Stenosis   Postoperative Diagnosis: Same   Procedure:   Transcatheter Aortic Valve Replacement -Right Common Carotid Artery Approach  Edwards Sapien 3 Ultra THV (size 23 mm, model # 9755RSL, serial # [id number])  Sub-xyphoid pericardial window     Co-Surgeons:  Gaye Pollack, MD and Lauree Chandler, MD   Anesthesiologist:  A. Hodierne, MD  Echocardiographer:  Osborne Oman, MD  Pre-operative Echo Findings: Severe aortic stenosis Normal left ventricular systolic function Small pericardial effusion  Post-operative Echo Findings: No paravalvular leak Normal left ventricular systolic function Large pericardial effusion   BRIEF CLINICAL NOTE AND INDICATIONS FOR SURGERY  This 88 year old woman has stage D, severe, symptomatic aortic stenosis with New York Heart Association class III symptoms of exertional fatigue and shortness of breath consistent with chronic diastolic congestive heart failure.  She also has a history of remote smoking and COPD which contributes to her shortness of breath but her symptoms have been worsening over the last couple years and are likely worsening due to her aortic stenosis.  I have personally reviewed her 2D echocardiogram, cardiac catheterization, and CTA studies.  Her echo shows a heavily calcified and thickened aortic valve with restricted leaflet mobility.  It looks like a bicuspid valve to me.  The mean gradient was 34 mmHg with a valve area of 0.62 cm consistent with severe aortic stenosis.  Left ventricular ejection fraction is 60 to 65% with grade 2 diastolic dysfunction.  There is trivial mitral regurgitation.  Cardiac catheterization shows mild nonobstructive coronary disease with normal PA pressures.  Given her advanced age and general frailty I do not think she is a candidate  for open surgical aortic valve replacement but transcatheter aortic valve replacement is a reasonable option.  She is still living independently at home with her husband and would like to be more active but is limited due to her exertional fatigue and shortness of breath.  Her gated cardiac CTA shows anatomy suitable for TAVR using a SAPIEN 3 valve.  Her abdominal and pelvic CTA shows relatively small iliac and femoral vessels with moderate calcified atherosclerosis that may be too small to allow transfemoral insertion.  She does appear to have suitable right common carotid artery access.  Left subclavian and left carotid access are not possible due to size and atherosclerosis.   The patient and her husband were counseled at length regarding treatment alternatives for management of severe symptomatic aortic stenosis. The risks and benefits of surgical intervention has been discussed in detail. Long-term prognosis with medical therapy was discussed. Alternative approaches such as conventional surgical aortic valve replacement, transcatheter aortic valve replacement, and palliative medical therapy were compared and contrasted at length. This discussion was placed in the context of the patient's own specific clinical presentation and past medical history. All of their questions have been addressed.    Following the decision to proceed with transcatheter aortic valve replacement, a discussion was held regarding what types of management strategies would be attempted intraoperatively in the event of life-threatening complications, including whether or not the patient would be considered a candidate for the use of cardiopulmonary bypass and/or conversion to open sternotomy for attempted surgical intervention.  I do not think she is a candidate for emergent sternotomy or use of cardiopulmonary bypass to manage any intraoperative complications given her advanced age and frailty.  The patient has  been advised of a variety  of complications that might develop including but not limited to risks of death, stroke, paravalvular leak, aortic dissection or other major vascular complications, aortic annulus rupture, device embolization, cardiac rupture or perforation, mitral regurgitation, acute myocardial infarction, arrhythmia, heart block or bradycardia requiring permanent pacemaker placement, congestive heart failure, respiratory failure, renal failure, pneumonia, infection, other late complications related to structural valve deterioration or migration, or other complications that might ultimately cause a temporary or permanent loss of functional independence or other long term morbidity. The patient provides full informed consent for the procedure as described and all questions were answered.     DETAILS OF THE OPERATIVE PROCEDURE  PREPARATION:    The patient was brought to the operating room on the above mentioned date and appropriate monitoring was established by the anesthesia team. The patient was placed in the supine position on the operating table.  Intravenous antibiotics were administered.  General endotracheal anesthesia was induced uneventfully.  Baseline transesophageal  echocardiogram was performed. The patient's abdomen and both groins were prepped and draped in a sterile manner. A time out procedure was performed.   PERIPHERAL ACCESS:    Using the modified Seldinger technique, femoral arterial and venous access was obtained with placement of 6 Fr sheaths on the right side.  A pigtail diagnostic catheter was passed through the right arterial sheath under fluoroscopic guidance into the aortic root.  A temporary transvenous pacemaker catheter was passed through the right femoral venous sheath under fluoroscopic guidance into the right ventricle.  The pacemaker was tested to ensure stable lead placement and pacemaker capture. Aortic root angiography was performed in order to determine the optimal angiographic  angle for valve deployment.   RIGHT COMMON CAROTID ARTERY ACCESS:   A transverse incision was made at the base of the rightneck between the heads of the sternocleidomastoid muscle and carried down through the subcutaneous tissue using electrocautery. The platysma muscle was divided. The carotid sheath was opened and the common carotid artery identified. The patient was heparinized systemically and ACT verified > 250 seconds.  A double concentric purse string suture of CV-4 gortex was placed in the anterior wall of the artery. The artery was cannulated with a needle and a J- wire advanced into the ascending aorta. An 8 F sheath was inserted over the wire. The  aortic valve prosthesis was crossed with an AL-1  catheter and a straight wire. This was exchanged for a pigtail catheter and position was confirmed in the LV apex.  The pigtail catheter was exchanged for an Amplatz Extra-stiff wire in the LV apex.  Then a 24F E-sheath was inserted into the carotid artery and the tip advanced into the aortic arch.    BALLOON AORTIC VALVULOPLASTY:   Not performed   TRANSCATHETER HEART VALVE DEPLOYMENT:   An Edwards Sapien 3 Ultra transcatheter heart valve (size 23 mm) was prepared and crimped per manufacturer's guidelines, and the proper orientation of the valve is confirmed on the [employer] delivery system. The valve was advanced through the introducer sheath using normal technique until in an appropriate position in the ascending aorta beyond the sheath tip. The balloon was then retracted and using the fine-tuning wheel was centered on the valve. The valve was carefully positioned across the aortic valve annulus. She developed hypotension. The Commander catheter was retracted using normal technique. Once final position of the valve has been confirmed by angiographic assessment, the valve is deployed while temporarily holding ventilation and during rapid ventricular  pacing to maintain systolic blood  pressure < 50 mmHg and pulse pressure < 10 mmHg. The balloon inflation is held for >3 seconds after reaching full deployment volume. Once the balloon has fully deflated the balloon is retracted into the ascending aorta and valve function is assessed using echocardiography. There is felt to be no paravalvular leak and no central aortic insufficiency.  The patient remained hypotensive. TEE showed a large pericardial effusion.  The valve position and function appeared normal. There was no sign of annular or aortic rupture. I decided to perform a sub-xyphoid pericardial window to drain the effusion. Protamine was given. The sheath was removed and carotid artery closure performed using the pursestring sutures.      Sub-xyphoid pericardial window:  A 4 cm incision was made over the xyphoid process and carried down through the subcutaneous tissue using cautery until the midline fascia was encountered. The fascia was divided in the midline and the subxyphoid space entered. The anterior pericardium was visualized just at the diaphragm and it was opened with cautery. The pericardial space was entered and about 500 cc of  bloody fluid was removed. TEE confirmed that all of the fluid was removed. There was minimal additional bloody drainage. A 36 F right angle chest tube was placed in the inferior pericardial space through a separate small incision. Hemostasis was complete. The midline fascia was approximated with interrupted #0 vicryl sutures. The subcutaneous tissue was approximated with continuous 2-0 vicryl suture and the skin with 3-0 vicryl subcuticular suture. The sponge, needle, and instrument counts were correct according to the nurses. Dermabond was applied to the incision.  PROCEDURE COMPLETION:   The temporary pacemaker, pigtail catheter and femoral sheaths were removed with manual pressure used for hemostasis.  A Mynx femoral closure device was utilized following removal of the diagnostic sheath in the  right femoral artery.  The patient tolerated the procedure well and is transported to the PACU in stable condition. All sponge, instrument and needle counts are verified correct at completion of the operation.   No blood products were administered during the operation.  The patient received a total of 40 mL of intravenous contrast during the procedure.   Gaye Pollack, MD 08/13/2021

## 2021-08-13 NOTE — Transfer of Care (Signed)
Immediate Anesthesia Transfer of Care Note  Patient: Paisyn Guercio Gaige  Procedure(s) Performed: Transcatheter Aortic Valve Replacement-Carotid (Right) TRANSESOPHAGEAL ECHOCARDIOGRAM (TEE) PERICARDIAL WINDOW  Patient Location: PACU  Anesthesia Type:General  Level of Consciousness: drowsy and patient cooperative  Airway & Oxygen Therapy: Patient Spontanous Breathing and Patient connected to face mask oxygen  Post-op Assessment: Report given to RN  Post vital signs: Reviewed and stable  Last Vitals:  Vitals Value Taken Time  BP 100/55 08/13/21 1015  Temp 36.8 C 08/13/21 1015  Pulse 71 08/13/21 1016  Resp 22 08/13/21 1016  SpO2 96 % 08/13/21 1016  Vitals shown include unvalidated device data.  Last Pain:  Vitals:   08/13/21 0616  TempSrc:   PainSc: 4       Patients Stated Pain Goal: 1 (35/46/56 8127)  Complications: No notable events documented.

## 2021-08-14 ENCOUNTER — Inpatient Hospital Stay (HOSPITAL_COMMUNITY): Payer: Medicare PPO

## 2021-08-14 ENCOUNTER — Encounter (HOSPITAL_COMMUNITY): Payer: Self-pay | Admitting: Cardiovascular Disease

## 2021-08-14 DIAGNOSIS — Z954 Presence of other heart-valve replacement: Secondary | ICD-10-CM

## 2021-08-14 DIAGNOSIS — I350 Nonrheumatic aortic (valve) stenosis: Secondary | ICD-10-CM

## 2021-08-14 DIAGNOSIS — Z952 Presence of prosthetic heart valve: Secondary | ICD-10-CM | POA: Diagnosis not present

## 2021-08-14 LAB — BASIC METABOLIC PANEL
Anion gap: 6 (ref 5–15)
Anion gap: 5 (ref 5–15)
Anion gap: 6 (ref 5–15)
BUN: 18 mg/dL (ref 8–23)
BUN: 20 mg/dL (ref 8–23)
BUN: 22 mg/dL (ref 8–23)
CO2: 23 mmol/L (ref 22–32)
CO2: 22 mmol/L (ref 22–32)
CO2: 23 mmol/L (ref 22–32)
Calcium: 8.1 mg/dL — ABNORMAL LOW (ref 8.9–10.3)
Calcium: 8.3 mg/dL — ABNORMAL LOW (ref 8.9–10.3)
Calcium: 8.7 mg/dL — ABNORMAL LOW (ref 8.9–10.3)
Chloride: 107 mmol/L (ref 98–111)
Chloride: 108 mmol/L (ref 98–111)
Chloride: 107 mmol/L (ref 98–111)
Creatinine, Ser: 1.26 mg/dL — ABNORMAL HIGH (ref 0.44–1.00)
Creatinine, Ser: 1.52 mg/dL — ABNORMAL HIGH (ref 0.44–1.00)
Creatinine, Ser: 1.65 mg/dL — ABNORMAL HIGH (ref 0.44–1.00)
GFR, Estimated: 40 mL/min — ABNORMAL LOW (ref >60)
GFR, Estimated: 32 mL/min — ABNORMAL LOW (ref >60)
GFR, Estimated: 29 mL/min — ABNORMAL LOW (ref >60)
Glucose, Bld: 104 mg/dL — ABNORMAL HIGH (ref 70–99)
Glucose, Bld: 93 mg/dL (ref 70–99)
Glucose, Bld: 94 mg/dL (ref 70–99)
Potassium: 6.4 mmol/L (ref 3.5–5.1)
Potassium: 6.7 mmol/L (ref 3.5–5.1)
Potassium: 6.2 mmol/L — ABNORMAL HIGH (ref 3.5–5.1)
Sodium: 136 mmol/L (ref 135–145)
Sodium: 135 mmol/L (ref 135–145)
Sodium: 136 mmol/L (ref 135–145)

## 2021-08-14 LAB — TRIGLYCERIDES: Triglycerides: 55 mg/dL (ref <150)

## 2021-08-14 LAB — ECHOCARDIOGRAM COMPLETE
AR max vel: 1.7 cm2
AV Area VTI: 1.89 cm2
AV Area mean vel: 1.73 cm2
AV Mean grad: 6 mmHg
AV Peak grad: 10.3 mmHg
Ao pk vel: 1.61 m/s
Area-P 1/2: 2.83 cm2
Calc EF: 62.6 %
Height: 61 in
MV VTI: 1.42 cm2
S' Lateral: 2.2 cm
Single Plane A2C EF: 63.3 %
Single Plane A4C EF: 63.8 %
Weight: 1686.08 oz

## 2021-08-14 LAB — CBC
HCT: 30 % — ABNORMAL LOW (ref 36.0–46.0)
Hemoglobin: 9.8 g/dL — ABNORMAL LOW (ref 12.0–15.0)
MCH: 30.9 pg (ref 26.0–34.0)
MCHC: 32.7 g/dL (ref 30.0–36.0)
MCV: 94.6 fL (ref 80.0–100.0)
Platelets: 139 10*3/uL — ABNORMAL LOW (ref 150–400)
RBC: 3.17 MIL/uL — ABNORMAL LOW (ref 3.87–5.11)
RDW: 13.2 % (ref 11.5–15.5)
WBC: 15.7 10*3/uL — ABNORMAL HIGH (ref 4.0–10.5)
nRBC: 0 % (ref 0.0–0.2)

## 2021-08-14 LAB — MAGNESIUM: Magnesium: 1.6 mg/dL — ABNORMAL LOW (ref 1.7–2.4)

## 2021-08-14 MED ORDER — CALCIUM GLUCONATE-NACL 1-0.675 GM/50ML-% IV SOLN
1.0000 g | Freq: Once | INTRAVENOUS | Status: AC
  Administered 2021-08-14: 1000 mg via INTRAVENOUS
  Filled 2021-08-14: qty 50

## 2021-08-14 MED ORDER — SODIUM ZIRCONIUM CYCLOSILICATE 10 G PO PACK
10.0000 g | PACK | Freq: Once | ORAL | Status: AC
  Administered 2021-08-14: 10 g via ORAL
  Filled 2021-08-14: qty 1

## 2021-08-14 MED ORDER — SODIUM CHLORIDE 0.9 % IV SOLN
250.0000 mL | INTRAVENOUS | Status: DC | PRN
  Administered 2021-08-14: 250 mL via INTRAVENOUS

## 2021-08-14 MED ORDER — OXYCODONE HCL 5 MG PO TABS: 5.0000 mg | ORAL_TABLET | ORAL | Status: DC | PRN

## 2021-08-14 MED ORDER — CHLORHEXIDINE GLUCONATE CLOTH 2 % EX PADS
6.0000 | MEDICATED_PAD | Freq: Every day | CUTANEOUS | Status: DC
  Administered 2021-08-14 – 2021-08-16 (×3): 6 via TOPICAL

## 2021-08-14 MED ORDER — TRAMADOL HCL 50 MG PO TABS: 50.0000 mg | ORAL_TABLET | Freq: Four times a day (QID) | ORAL | Status: DC | PRN

## 2021-08-14 MED ORDER — INSULIN ASPART 100 UNIT/ML IJ SOLN
5.0000 [IU] | Freq: Once | INTRAMUSCULAR | Status: AC
  Administered 2021-08-14: 5 [IU] via INTRAVENOUS

## 2021-08-14 MED ORDER — MAGNESIUM SULFATE 4 GM/100ML IV SOLN: 4.0000 g | Freq: Once | INTRAVENOUS | Status: DC

## 2021-08-14 MED ORDER — DEXTROSE 50 % IV SOLN
50.0000 mL | Freq: Once | INTRAVENOUS | Status: AC
  Administered 2021-08-14: 50 mL via INTRAVENOUS
  Filled 2021-08-14: qty 50

## 2021-08-14 MED ORDER — MAGNESIUM SULFATE 2 GM/50ML IV SOLN
2.0000 g | Freq: Once | INTRAVENOUS | Status: AC
  Administered 2021-08-14: 2 g via INTRAVENOUS
  Filled 2021-08-14: qty 50

## 2021-08-14 NOTE — Progress Notes (Addendum)
? ?  [facility]   ?MULTIDISCIPLINARY HEART VALVE TEAM  ? ?Patient developed hyperkalemia after given IV runs yesterday for supplementation and mild AKI. Will treat her with 10mg  of Lokelma, one dose of Insulin dextrose and continue IV fluids.  ? ?Recheck BMET tomorrow AM.  ? ? ?Angelena Form PA-C  [facility]  ? ?

## 2021-08-14 NOTE — Progress Notes (Signed)
Patient ID: Katherine [name], female   DOB: Oct 29, 1928, 88 y.o.   MRN: [id number] ?TCTS Evening Rounds: ? ?BP has been 90's to 110 today. HR sinus 80's ? ?Chest tube is out. ?Echo looked fine with no pericardial effusion and normal LV function. Aortic valve prosthesis functioning normally. ? ?UO ok. Giving some extra IVF today since creat bumped. ? ?BMET ?   ?Component Value Date/Time  ? NA 136 08/14/2021 1603  ? NA 144 06/24/2021 1555  ? K 6.2 (H) 08/14/2021 1603  ? CL 107 08/14/2021 1603  ? CO2 23 08/14/2021 1603  ? GLUCOSE 94 08/14/2021 1603  ? BUN 22 08/14/2021 1603  ? BUN 27 06/24/2021 1555  ? CREATININE 1.65 (H) 08/14/2021 1603  ? CREATININE 1.18 (H) 01/22/2021 1452  ? CREATININE 0.99 (H) 11/27/2020 0931  ? CALCIUM 8.7 (L) 08/14/2021 1603  ? EGFR 50 (L) 06/24/2021 1555  ? GFRNONAA 29 (L) 08/14/2021 1603  ? GFRNONAA 44 (L) 01/22/2021 1452  ? GFRNONAA 50 (L) 11/27/2020 0931  ? ?Received Lokelma and insulin today. Expect K+ will be down by the morning. ? ?

## 2021-08-14 NOTE — Progress Notes (Signed)
1 Day Post-Op Procedure(s) (LRB): ?Transcatheter Aortic Valve Replacement-Carotid (Right) ?TRANSESOPHAGEAL ECHOCARDIOGRAM (TEE) (N/A) ?PERICARDIAL WINDOW ?Subjective: ?Says she feels dopey. Pain under control, just in left back. Breathing is better postop. ? ?Objective: ?Vital signs in last 24 hours: ?Temp:  [97.5 ?F (36.4 ?C)-98.2 ?F (36.8 ?C)] 97.8 ?F (36.6 ?C) (02/28 1320) ?Pulse Rate:  [72-99] 86 (03/01 0600) ?Cardiac Rhythm: Normal sinus rhythm (02/28 1330) ?Resp:  [11-27] 19 (03/01 0600) ?BP: (81-134)/(44-99) 87/51 (03/01 0600) ?SpO2:  [90 %-100 %] 95 % (03/01 0600) ?Arterial Line BP: (81-142)/(33-65) 96/42 (03/01 0500) ?Weight:  [47.8 kg] 47.8 kg (03/01 0500) ? ?Hemodynamic parameters for last 24 hours: ?  ? ?Intake/Output from previous day: ?02/28 0701 - 03/01 0700 ?In: 2229.2 [I.V.:1779.2; IV HYIFOYDXA:128] ?Out: 485 [Urine:30; Blood:200; Chest Tube:255] ?Intake/Output this shift: ?No intake/output data recorded. ? ?General appearance: alert and cooperative ?Neurologic: intact ?Heart: regular rate and rhythm, S1, S2 normal, no murmur ?Lungs: clear to auscultation bilaterally ?Abdomen: soft, non-tender ?Extremities: no edema ?Wound: incisions ok, right groin site ok ? ?Lab Results: ?Recent Labs  ?  08/13/21 ?1025 08/14/21 ?7867  ?WBC  --  15.7*  ?HGB 10.5* 9.8*  ?HCT 31.0* 30.0*  ?PLT  --  139*  ? ?BMET:  ?Recent Labs  ?  08/13/21 ?1025 08/14/21 ?6720  ?NA 143 136  ?K 3.0* 6.4*  ?CL 106 107  ?CO2  --  23  ?GLUCOSE 142* 104*  ?BUN 10 18  ?CREATININE 0.70 1.26*  ?CALCIUM  --  8.1*  ?  ?PT/INR: No results for input(s): LABPROT, INR in the last 72 hours. ?ABG ?   ?Component Value Date/Time  ? PHART 7.46 (H) 08/09/2021 1132  ? HCO3 29.2 (H) 08/09/2021 1132  ? TCO2 25 08/13/2021 1025  ? ACIDBASEDEF 1.0 07/11/2021 0951  ? O2SAT 99 08/09/2021 1132  ? ?CBG (last 3)  ?No results for input(s): GLUCAP in the last 72 hours. ? ?CXR: clear ? ?ECG: sinus, no heart block ? ?Assessment/Plan: ?S/P Procedure(s)  (LRB): ?Transcatheter Aortic Valve Replacement-Carotid (Right) ?TRANSESOPHAGEAL ECHOCARDIOGRAM (TEE) (N/A) ?PERICARDIAL WINDOW ? ?POD 1 ?Hemodynamically stable in sinus rhythm.  ? ?Pericardial tube output low, thin serosanguinous. Will keep in this morning and probably remove later after echo. This should help her pain a lot. ? ?Slight bump in creat probably due to hypotension. K+ 6.4 this am after 6 runs replacement yesterday. Will repeat this am. Creat should normalize quickly. ? ?IS, OOB. ? ? LOS: 1 day  ? ? ?Gaye Pollack ?08/14/2021 ? ? ?

## 2021-08-14 NOTE — Anesthesia Postprocedure Evaluation (Signed)
Anesthesia Post Note  Patient: Katherine [name]  Procedure(s) Performed: Transcatheter Aortic Valve Replacement-Carotid (Right) TRANSESOPHAGEAL ECHOCARDIOGRAM (TEE) PERICARDIAL WINDOW     Patient location during evaluation: PACU Anesthesia Type: General Level of consciousness: awake and alert Pain management: pain level controlled Vital Signs Assessment: post-procedure vital signs reviewed and stable Respiratory status: spontaneous breathing, nonlabored ventilation, respiratory function stable and patient connected to nasal cannula oxygen Cardiovascular status: blood pressure returned to baseline and stable Postop Assessment: no apparent nausea or vomiting Anesthetic complications: no   No notable events documented.  Last Vitals:  Vitals:   08/14/21 0600 08/14/21 0756  BP: (!) 87/51   Pulse: 86   Resp: 19   Temp:  36.6 C  SpO2: 95%     Last Pain:  Vitals:   08/14/21 0756  TempSrc: Oral  PainSc:                  [place]

## 2021-08-15 DIAGNOSIS — Z954 Presence of other heart-valve replacement: Secondary | ICD-10-CM

## 2021-08-15 DIAGNOSIS — N179 Acute kidney failure, unspecified: Secondary | ICD-10-CM

## 2021-08-15 DIAGNOSIS — Z9889 Other specified postprocedural states: Secondary | ICD-10-CM

## 2021-08-15 DIAGNOSIS — I350 Nonrheumatic aortic (valve) stenosis: Secondary | ICD-10-CM

## 2021-08-15 LAB — BASIC METABOLIC PANEL
Anion gap: 10 (ref 5–15)
BUN: 21 mg/dL (ref 8–23)
CO2: 20 mmol/L — ABNORMAL LOW (ref 22–32)
Calcium: 8.8 mg/dL — ABNORMAL LOW (ref 8.9–10.3)
Chloride: 106 mmol/L (ref 98–111)
Creatinine, Ser: 1.59 mg/dL — ABNORMAL HIGH (ref 0.44–1.00)
GFR, Estimated: 30 mL/min — ABNORMAL LOW (ref >60)
Glucose, Bld: 88 mg/dL (ref 70–99)
Potassium: 5.1 mmol/L (ref 3.5–5.1)
Sodium: 136 mmol/L (ref 135–145)

## 2021-08-15 LAB — CBC
HCT: 30.7 % — ABNORMAL LOW (ref 36.0–46.0)
Hemoglobin: 9.2 g/dL — ABNORMAL LOW (ref 12.0–15.0)
MCH: 29.7 pg (ref 26.0–34.0)
MCHC: 30 g/dL (ref 30.0–36.0)
MCV: 99 fL (ref 80.0–100.0)
Platelets: 105 10*3/uL — ABNORMAL LOW (ref 150–400)
RBC: 3.1 MIL/uL — ABNORMAL LOW (ref 3.87–5.11)
RDW: 13.2 % (ref 11.5–15.5)
WBC: 10.4 10*3/uL (ref 4.0–10.5)
nRBC: 0 % (ref 0.0–0.2)

## 2021-08-15 LAB — MAGNESIUM: Magnesium: 2.2 mg/dL (ref 1.7–2.4)

## 2021-08-15 MED ORDER — SODIUM CHLORIDE 0.9 % IV SOLN
250.0000 mL | INTRAVENOUS | Status: DC | PRN
  Administered 2021-08-15: 250 mL via INTRAVENOUS

## 2021-08-15 MED FILL — Magnesium Sulfate Inj 50%: INTRAMUSCULAR | Qty: 10 | Status: AC

## 2021-08-15 MED FILL — Potassium Chloride Inj 2 mEq/ML: INTRAVENOUS | Qty: 40 | Status: AC

## 2021-08-15 NOTE — Progress Notes (Signed)
CARDIAC REHAB PHASE I  ? ?Pt up in chair most of day and ambulated earlier assist x2 with RN. Pt weak, walks with walker at baseline. Pt eager to go home. Stressed importance of safety with pt. Pt states understanding. Will continue to follow. ? ?[phone] ?Rufina Falco, RN BSN ?08/15/2021 ?3:05 PM ? ?

## 2021-08-15 NOTE — Progress Notes (Signed)
2 Days Post-Op Procedure(s) (LRB): ?Transcatheter Aortic Valve Replacement-Carotid (Right) ?TRANSESOPHAGEAL ECHOCARDIOGRAM (TEE) (N/A) ?PERICARDIAL WINDOW ?Subjective: ?Feels ok this am. Says she slept some. No pain. Says breathing a little labored. ? ?Objective: ?Vital signs in last 24 hours: ?Temp:  [97.8 ?F (36.6 ?C)-97.9 ?F (36.6 ?C)] 97.8 ?F (36.6 ?C) (03/01 1948) ?Pulse Rate:  [72-98] 98 (03/02 0600) ?Cardiac Rhythm: Normal sinus rhythm (03/02 0400) ?Resp:  [10-28] 28 (03/02 0600) ?BP: (85-115)/(40-75) 109/51 (03/02 0600) ?SpO2:  [93 %-99 %] 97 % (03/02 0600) ?Arterial Line BP: (88-132)/(37-49) 132/48 (03/01 1400) ?Weight:  [47.8 kg] 47.8 kg (03/02 0500) ? ?Hemodynamic parameters for last 24 hours: ?  ? ?Intake/Output from previous day: ?03/01 0701 - 03/02 0700 ?In: 1669.2 [P.O.:120; I.V.:1463.8; IV Piggyback:85.4] ?Out: 420 [Urine:400; Chest Tube:20] ?Intake/Output this shift: ?No intake/output data recorded. ? ?General appearance: alert and cooperative ?Neurologic: intact ?Heart: regular rate and rhythm, S1, S2 normal, no murmur ?Lungs: diminished breath sounds bibasilar ?Abdomen: soft, non-tender; bowel sounds normal ?Extremities: edema mild ?Wound: incisions ok ? ?Lab Results: ?Recent Labs  ?  08/13/21 ?1025 08/14/21 ?2694  ?WBC  --  15.7*  ?HGB 10.5* 9.8*  ?HCT 31.0* 30.0*  ?PLT  --  139*  ? ?BMET:  ?Recent Labs  ?  08/14/21 ?1023 08/14/21 ?1603  ?NA 135 136  ?K 6.7* 6.2*  ?CL 108 107  ?CO2 22 23  ?GLUCOSE 93 94  ?BUN 20 22  ?CREATININE 1.52* 1.65*  ?CALCIUM 8.3* 8.7*  ?  ?PT/INR: No results for input(s): LABPROT, INR in the last 72 hours. ?ABG ?   ?Component Value Date/Time  ? PHART 7.46 (H) 08/09/2021 1132  ? HCO3 29.2 (H) 08/09/2021 1132  ? TCO2 25 08/13/2021 1025  ? ACIDBASEDEF 1.0 07/11/2021 0951  ? O2SAT 99 08/09/2021 1132  ? ?CBG (last 3)  ?No results for input(s): GLUCAP in the last 72 hours. ? ?Assessment/Plan: ?S/P Procedure(s) (LRB): ?Transcatheter Aortic Valve Replacement-Carotid  (Right) ?TRANSESOPHAGEAL ECHOCARDIOGRAM (TEE) (N/A) ?PERICARDIAL WINDOW ? ?POD 2 ?Hemodynamics stable with BP low normal range in sinus rhythm. ? ?Sats 98% 2L ? ?Acute kidney injury due to periop hypotension related to severe AS, cardiac perforation with hemopericardium and tamponade, contrast. BMET pending this am. Have continue gentle IV hydration. Will back off on fluid this am is creat stable on BMET. ? ?PT consult for help with mobilization. ? ?IS, OOB. ? ? ? ? LOS: 2 days  ? ? ?Katherine [name] ?08/15/2021 ? ? ?

## 2021-08-15 NOTE — Progress Notes (Deleted)
[facility] VALVE TEAM  Patient Name: Katherine [name] Date of Encounter: 08/15/2021  Admit date: 08/13/2021  Primary Care Provider: Elby Showers, MD [facility] Cardiologist: Lauree Chandler, MD  [facility] Electrophysiologist:  None   Hospital Problem List     Principal Problem:   S/P TAVR (transcatheter aortic valve replacement) Active Problems:   Hyperlipidemia   GE reflux   Chronic back pain   COPD, severe (HCC)   Malnutrition of mild degree (HCC)   Iron deficiency anemia   Severe aortic stenosis   Depression   Pericardial effusion   Status post creation of pericardial window     Subjective   Feeling a bit better.  Inpatient Medications    Scheduled Meds:  ALPRAZolam  0.25 mg Oral QHS   aspirin  81 mg Oral Daily   Chlorhexidine Gluconate Cloth  6 each Topical Daily   escitalopram  5 mg Oral Daily   melatonin  10 mg Oral QHS   pantoprazole  40 mg Oral Daily   sodium chloride flush  3 mL Intravenous Q12H   traZODone  100 mg Oral QHS   Continuous Infusions:  sodium chloride Stopped (08/14/21 1202)   sodium chloride 75 mL/hr at 08/15/21 0000   phenylephrine (NEO-SYNEPHRINE) Adult infusion     PRN Meds: sodium chloride, acetaminophen **OR** acetaminophen, albuterol, ondansetron (ZOFRAN) IV, sodium chloride flush, traMADol   Vital Signs    Vitals:   08/14/21 2200 08/14/21 2300 08/14/21 2330 08/15/21 0030  BP: (!) 103/47 (!) 87/49 (!) 85/44 (!) 94/53  Pulse: 85 79 77 80  Resp: 13 12 12 14   Temp:      TempSrc:      SpO2: 98% 98% 98% 97%  Weight:      Height:        Intake/Output Summary (Last 24 hours) at 08/15/2021 0126 Last data filed at 08/15/2021 0000 Gross per 24 hour  Intake 1407.49 ml  Output 510 ml  Net 897.49 ml   Filed Weights   08/13/21 0602 08/14/21 0500  Weight: 42.6 kg 47.8 kg    Physical Exam    GEN: thin elderly white female HEENT: Grossly normal.  Cardiac: RRR, no  murmurs, rubs, or gallops. No clubbing, cyanosis, edema.   Respiratory:  Respirations regular and unlabored, clear to auscultation bilaterally. GI: Soft, nontender, nondistended, BS + x 4. MS: no deformity or atrophy. Skin: warm and dry, no rash. Carotid, groin, subxiphoid Incisions okay Neuro:  Strength and sensation are intact. Psych: AAOx3.  Normal affect.  Labs    CBC Recent Labs    08/13/21 1025 08/14/21 0555  WBC  --  15.7*  HGB 10.5* 9.8*  HCT 31.0* 30.0*  MCV  --  94.6  PLT  --  160*   Basic Metabolic Panel Recent Labs    08/14/21 0555 08/14/21 1023 08/14/21 1603  NA 136 135 136  K 6.4* 6.7* 6.2*  CL 107 108 107  CO2 23 22 23   GLUCOSE 104* 93 94  BUN 18 20 22   CREATININE 1.26* 1.52* 1.65*  CALCIUM 8.1* 8.3* 8.7*  MG 1.6*  --   --    Liver Function Tests No results for input(s): AST, ALT, ALKPHOS, BILITOT, PROT, ALBUMIN in the last 72 hours. No results for input(s): LIPASE, AMYLASE in the last 72 hours. Cardiac Enzymes No results for input(s): CKTOTAL, CKMB, CKMBINDEX, TROPONINI in the last 72 hours. BNP Invalid input(s): POCBNP D-Dimer No results for  input(s): DDIMER in the last 72 hours. Hemoglobin A1C No results for input(s): HGBA1C in the last 72 hours. Fasting Lipid Panel Recent Labs    08/14/21 0555  TRIG 55   Thyroid Function Tests No results for input(s): TSH, T4TOTAL, T3FREE, THYROIDAB in the last 72 hours.  Invalid input(s): FREET3  Telemetry    sinus - Personally Reviewed  ECG    Sinus, HR 83 - Personally Reviewed  Radiology    DG CHEST PORT 1 VIEW  Result Date: 08/14/2021 CLINICAL DATA:  Chest tube present status post TAVR. Very sore chest. EXAM: PORTABLE CHEST 1 VIEW COMPARISON:  08/13/2021 FINDINGS: There is again a chest tube with tip and side port overlying the inferior mid to right mediastinum/hemithorax. Postsurgical changes are again seen of TAVR. Cardiac silhouette is again at the upper limits of normal size. Mediastinal  contours are within normal limits with moderate calcification seen within the aortic arch. Interval decrease in the prior basilar mild interstitial thickening. Lucencies indicating chronic emphysematous changes within the superior greater than inferior lungs similar to prior. No focal airspace opacity to indicate pneumonia. No definite pleural effusion. No pneumothorax is seen. Mild multilevel degenerative disc changes of the thoracic spine. Surgical clips overlie the upper abdomen. Cement augmentation of the approximate L1 vertebral body. Partial visualization of more inferior lumbar spine posterior fusion hardware. IMPRESSION:: IMPRESSION: 1. Mediastinal drain tip again overlies the inferior right mediastinum. 2. Status post TAVR. 3. Resolution of the prior bibasilar mild interstitial thickening. 4. Chronic emphysematous changes. Electronically Signed   By: Yvonne Kendall M.D.   On: 08/14/2021 08:18   DG Chest Port 1 View  Result Date: 08/13/2021 CLINICAL DATA:  Status post TAVR. EXAM: PORTABLE CHEST 1 VIEW COMPARISON:  Chest x-ray dated August 09, 2021. FINDINGS: Normal heart size status post interval TAVR. Single mediastinal drain noted. New increased interstitial markings at the left greater than right lung bases superimposed on chronic emphysema and interstitial coarsening. No focal consolidation, pleural effusion, or pneumothorax. No acute osseous abnormality. IMPRESSION: 1. Interval TAVR. New bibasilar interstitial edema. 2. COPD. Electronically Signed   By: Titus Dubin M.D.   On: 08/13/2021 10:28   ECHOCARDIOGRAM COMPLETE  Result Date: 08/14/2021    ECHOCARDIOGRAM REPORT   Patient Name:   Katherine [name] Date of Exam: 08/14/2021 Medical Rec #:  [id number]         Height:       61.0 in Accession #:    [id number]        Weight:       105.4 lb Date of Birth:  [birth year]          BSA:          1.439 m Patient Age:    88 years          BP:           80/40 mmHg Patient Gender: F                 HR:            79 bpm. Exam Location:  Inpatient Procedure: 2D Echo, Cardiac Doppler and Color Doppler Indications:    Post TAVR  History:        Patient has prior history of Echocardiogram examinations, most                 recent 08/13/2021. CAD, COPD, Aortic Valve Disease; Risk  Factors:Former Smoker and Dyslipidemia.                 Aortic Valve: 23 mm Sapien prosthetic, stented (TAVR) valve is                 present in the aortic position. Procedure Date: 08/13/2021.  Sonographer:    Jyl Heinz Referring Phys: [id number] [place]  1. The aortic valve has replaced with a 23 mm Edwards Sapien Valve. Aortic valve regurgitation is not visualized. Effective orifice area, by VTI measures 1.89 cm. Aortic valve mean gradient measures 6.0 mmHg. Peak gradient 10 mm Hg. Acceleration time 60 ms. DVI 0.7  2. Left ventricular ejection fraction, by estimation, is 60 to 65%. The left ventricle has normal function. The left ventricle has no regional wall motion abnormalities. Left ventricular diastolic parameters are consistent with Grade I diastolic dysfunction (impaired relaxation). Elevated left atrial pressure.  3. Right ventricular systolic function is normal. The right ventricular size is normal. There is normal pulmonary artery systolic pressure.  4. A small pericardial effusion is present. The pericardial effusion is circumferential. There is excessive respiratory variation in septal movement. no other signs of increased pericardial pressure.  5. The mitral valve is degenerative. No evidence of mitral valve regurgitation. Moderate mild to mitral stenosis. The mean mitral valve gradient is 4.0 mmHg with average heart rate of 89 bpm. MVA by continuity equation 1.36 cm2.  6. The inferior vena cava is dilated in size with >50% respiratory variability, suggesting right atrial pressure of 8 mmHg. Comparison(s): Stable prosthetic valve parameters. Effusion has improved from prior. FINDINGS  Left  Ventricle: Left ventricular ejection fraction, by estimation, is 60 to 65%. The left ventricle has normal function. The left ventricle has no regional wall motion abnormalities. The left ventricular internal cavity size was normal in size. There is  no left ventricular hypertrophy. Left ventricular diastolic parameters are consistent with Grade I diastolic dysfunction (impaired relaxation). Elevated left atrial pressure. Right Ventricle: The right ventricular size is normal. No increase in right ventricular wall thickness. Right ventricular systolic function is normal. There is normal pulmonary artery systolic pressure. The tricuspid regurgitant velocity is 2.52 m/s, and  with an assumed right atrial pressure of 8 mmHg, the estimated right ventricular systolic pressure is 42.6 mmHg. Left Atrium: Left atrial size was normal in size. Right Atrium: Right atrial size was normal in size. Pericardium: A small pericardial effusion is present. The pericardial effusion is circumferential. There is excessive respiratory variation in septal movement. Mitral Valve: The mitral valve is degenerative in appearance. No evidence of mitral valve regurgitation. Mild to moderate mitral valve stenosis. MV peak gradient, 8.0 mmHg. The mean mitral valve gradient is 4.0 mmHg with average heart rate of 89 bpm. Tricuspid Valve: The tricuspid valve is not well visualized. Tricuspid valve regurgitation is trivial. No evidence of tricuspid stenosis. Aortic Valve: 10 mm hG,. The aortic valve has been repaired/replaced. Aortic valve regurgitation is not visualized. Aortic valve mean gradient measures 6.0 mmHg. Aortic valve peak gradient measures 10.3 mmHg. Aortic valve area, by VTI measures 1.89 cm. There is a 23 mm Sapien prosthetic, stented (TAVR) valve present in the aortic position. Procedure Date: 08/13/2021. Pulmonic Valve: The pulmonic valve was grossly normal. Pulmonic valve regurgitation is not visualized. No evidence of pulmonic  stenosis. Aorta: The aortic root is normal in size and structure and the ascending aorta was not well visualized. Venous: The inferior vena cava is dilated in size with greater  than 50% respiratory variability, suggesting right atrial pressure of 8 mmHg. IAS/Shunts: The interatrial septum was not well visualized.  LEFT VENTRICLE PLAX 2D LVIDd:         4.00 cm     Diastology LVIDs:         2.20 cm     LV e' medial:    4.14 cm/s LV PW:         1.00 cm     LV E/e' medial:  24.2 LV IVS:        0.80 cm     LV e' lateral:   3.00 cm/s LVOT diam:     1.80 cm     LV E/e' lateral: 33.3 LV SV:         56 LV SV Index:   39 LVOT Area:     2.54 cm  LV Volumes (MOD) LV vol d, MOD A2C: 50.9 ml LV vol d, MOD A4C: 56.3 ml LV vol s, MOD A2C: 18.7 ml LV vol s, MOD A4C: 20.4 ml LV SV MOD A2C:     32.2 ml LV SV MOD A4C:     56.3 ml LV SV MOD BP:      34.0 ml RIGHT VENTRICLE            IVC RV Basal diam:  3.10 cm    IVC diam: 2.20 cm RV S prime:     8.60 cm/s TAPSE (M-mode): 1.2 cm LEFT ATRIUM             Index        RIGHT ATRIUM           Index LA diam:        2.90 cm 2.02 cm/m   RA Area:     13.30 cm LA Vol (A2C):   27.7 ml 19.26 ml/m  RA Volume:   31.30 ml  21.76 ml/m LA Vol (A4C):   28.8 ml 20.02 ml/m LA Biplane Vol: 28.4 ml 19.74 ml/m  AORTIC VALVE AV Area (Vmax):    1.70 cm AV Area (Vmean):   1.73 cm AV Area (VTI):     1.89 cm AV Vmax:           160.50 cm/s AV Vmean:          112.000 cm/s AV VTI:            0.298 m AV Peak Grad:      10.3 mmHg AV Mean Grad:      6.0 mmHg LVOT Vmax:         106.95 cm/s LVOT Vmean:        76.150 cm/s LVOT VTI:          0.222 m LVOT/AV VTI ratio: 0.74  AORTA Ao Asc diam: 2.80 cm MITRAL VALVE                TRICUSPID VALVE MV Area (PHT): 2.83 cm     TR Peak grad:   25.4 mmHg MV Area VTI:   1.42 cm     TR Vmax:        252.00 cm/s MV Peak grad:  8.0 mmHg MV Mean grad:  4.0 mmHg     SHUNTS MV Vmax:       1.41 m/s     Systemic VTI:  0.22 m MV Vmean:      93.4 cm/s    Systemic Diam: 1.80 cm MV  Decel Time: 268 msec MV E velocity: 100.00 cm/s MV  A velocity: 122.00 cm/s MV E/A ratio:  0.82 Rudean Haskell MD Electronically signed by Rudean Haskell MD Signature Date/Time: 08/14/2021/5:10:50 PM    Final    ECHO TEE  Result Date: 08/13/2021    TRANSESOPHOGEAL ECHO REPORT   Patient Name:   SHAROLYN WEBER Date of Exam: 08/13/2021 Medical Rec #:  [id number]         Height:       61.0 in Accession #:    [id number]        Weight:       94.0 lb Date of Birth:  1929/06/06          BSA:          1.370 m Patient Age:    19 years          BP:           171/73 mmHg Patient Gender: F                 HR:           95 bpm. Exam Location:  Inpatient Procedure: TEE-Intraopertive, 3D Echo, Cardiac Doppler and Color Doppler Indications:     I35.2 Nonrheumatic aortic (valve) stenosis with insufficiency  History:         Patient has prior history of Echocardiogram examinations, most                  recent 05/21/2021. COPD, Aortic Valve Disease; Risk                  Factors:Dyslipidemia. Severe aortic stenosis.                  Aortic Valve: 23 mm Sapien prosthetic, stented (TAVR) valve is                  present in the aortic position.  Sonographer:     Roseanna Rainbow RDCS Referring Phys:  [place] Diagnosing Phys: Rudean Haskell MD  Sonographer Comments: TAVR procedure. PROCEDURE: After discussion of the risks and benefits of a TEE, an informed consent was obtained from the patient. The transesophogeal probe was passed without difficulty through the esophogus of the patient. Imaged were obtained with the patient in a supine position. Sedation performed by different physician. The patient was monitored while under deep sedation. The patient developed no complications during the procedure. Continuous sedation throughout procedure. IMPRESSIONS  1. Intervential Echo for valve replacement.  2. Prior to procedure, severely calcified aortic valve potential small raphe (functionally bicuspid). Aortic  valve regurgitation was mild and eccentric. Mean gradient of 41 mmHg, Peak gradient of 75 mm Hg, DVI 0.3, AVA by continunity equation 0.98 cm2 consistent with severe AS. Small pericardial effusion at the RV apex with on hemodynamic consequence.  3. Post Deployment there was a new moderate to large pericardial effusion first seen at the RV base then circumferention with decrease LV filling, RV and RA collapse. A pericardial window was performed.  4. Post procedure LV and RV function normalized.  5. There was small residual pericardial effusion without RV or RA collapse.  6. There is a 23 mm Sapien prosthetic (TAVR) valve present in the aortic position. There is no perivalvular leak. Mean gradient of 2 mm Hg, Peak gradient of 3 mm Hg. Effective orifice area 3.33 cm2. DVI 0.87 (overestimated). Suggestive of normally function prosthesis.  7. Left ventricular ejection fraction, by estimation, is 60 to 65%. The left ventricle has normal function.  8. Right ventricular systolic function is normal. The right ventricular size is normal.  9. No left atrial/left atrial appendage thrombus was detected. 10. Large pericardial effusion. Findings are consistent with cardiac tamponade. 11. The mitral valve is degenerative with anterior leaflet prolapse. Mild mitral valve regurgitation. MVA by continuity equation 2.7 cm2 (mild MS). 12. Evidence of atrial level shunting detected by color flow Doppler. Small PFO.Inter atrial lipomatous hypertrophy. 13. There is mild (Grade II) plaque involving the ascending aorta and descending aorta. Comparison(s): Successful placement of a 23 mm Sapien Valve. FINDINGS  Left Ventricle: Left ventricular ejection fraction, by estimation, is 60 to 65%. The left ventricle has normal function. The left ventricular internal cavity size was normal in size. Right Ventricle: The right ventricular size is normal. No increase in right ventricular wall thickness. Right ventricular systolic function is normal.  Left Atrium: Left atrial size was normal in size. No left atrial/left atrial appendage thrombus was detected. Right Atrium: Right atrial size was normal in size. Pericardium: A large pericardial effusion is present. There is evidence of cardiac tamponade. Mitral Valve: The mitral valve is degenerative in appearance. Mild mitral valve regurgitation. MV peak gradient, 10.5 mmHg. The mean mitral valve gradient is 6.0 mmHg. Tricuspid Valve: The tricuspid valve is normal in structure. Tricuspid valve regurgitation is trivial. Aortic Valve: The aortic valve has been repaired/replaced. Aortic valve regurgitation is mild. Aortic valve mean gradient measures 19.8 mmHg. Aortic valve peak gradient measures 28.7 mmHg. Aortic valve area, by VTI measures 1.31 cm. There is a 23 mm Sapien prosthetic, stented (TAVR) valve present in the aortic position. Pulmonic Valve: The pulmonic valve was not well visualized. Pulmonic valve regurgitation is trivial. Aorta: The aortic root and ascending aorta are structurally normal, with no evidence of dilitation. There is mild (Grade II) plaque involving the ascending aorta and descending aorta. IAS/Shunts: The interatrial septum appears to be lipomatous. Evidence of atrial level shunting detected by color flow Doppler.  LEFT VENTRICLE PLAX 2D LVOT diam:     1.92 cm LV SV:         70 LV SV Index:   51 LVOT Area:     2.90 cm  AORTIC VALVE AV Area (Vmax):    1.26 cm AV Area (Vmean):   1.23 cm AV Area (VTI):     1.31 cm AV Vmax:           267.73 cm/s AV Vmean:          167.900 cm/s AV VTI:            0.539 m AV Peak Grad:      28.7 mmHg AV Mean Grad:      19.8 mmHg LVOT Vmax:         116.70 cm/s LVOT Vmean:        71.133 cm/s LVOT VTI:          0.243 m LVOT/AV VTI ratio: 0.45 MITRAL VALVE MV Area VTI:  2.40 cm    SHUNTS MV Peak grad: 10.5 mmHg   Systemic VTI:  0.24 m MV Mean grad: 6.0 mmHg    Systemic Diam: 1.92 cm MV Vmax:      1.62 m/s MV Vmean:     118.0 cm/s Rudean Haskell MD  Electronically signed by Rudean Haskell MD Signature Date/Time: 08/13/2021/4:21:31 PM    Final    Structural Heart Procedure  Result Date: 08/13/2021 See surgical note for result.   Cardiac Studies   TAVR OPERATIVE NOTE  Date of Procedure:                08/13/2021   Preoperative Diagnosis:      Severe Aortic Stenosis    Postoperative Diagnosis:    Same    Procedure:        Transcatheter Aortic Valve Replacement -Right Common Carotid Artery Approach             Edwards Sapien 3 Ultra THV (size 23 mm, model # 9755RSL, serial # [id number])   Sub-xyphoid pericardial window                  Co-Surgeons:                        Gaye Pollack, MD and Lauree Chandler, MD     Anesthesiologist:                  Renaldo Reel, MD   Echocardiographer:              Osborne Oman, MD   Pre-operative Echo Findings: Severe aortic stenosis Normal left ventricular systolic function Small pericardial effusion   Post-operative Echo Findings: No paravalvular leak Normal left ventricular systolic function Large pericardial effusion  _____________________  Echo 08/15/31 IMPRESSIONS  1. The aortic valve has replaced with a 23 mm Edwards Sapien Valve.  Aortic valve regurgitation is not visualized. Effective orifice area, by  VTI measures 1.89 cm. Aortic valve mean gradient measures 6.0 mmHg. Peak  gradient 10 mm Hg. Acceleration time  60 ms. DVI 0.7   2. Left ventricular ejection fraction, by estimation, is 60 to 65%. The  left ventricle has normal function. The left ventricle has no regional  wall motion abnormalities. Left ventricular diastolic parameters are  consistent with Grade I diastolic  dysfunction (impaired relaxation). Elevated left atrial pressure.   3. Right ventricular systolic function is normal. The right ventricular  size is normal. There is normal pulmonary artery systolic pressure.   4. A small pericardial effusion is present. The pericardial effusion  is  circumferential. There is excessive respiratory variation in septal  movement. no other signs of increased pericardial pressure.   5. The mitral valve is degenerative. No evidence of mitral valve  regurgitation. Moderate mild to mitral stenosis. The mean mitral valve  gradient is 4.0 mmHg with average heart rate of 89 bpm. MVA by continuity  equation 1.36 cm2.   6. The inferior vena cava is dilated in size with >50% respiratory  variability, suggesting right atrial pressure of 8 mmHg.   Comparison(s): Stable prosthetic valve parameters. Effusion has improved  from prior.    Patient Profile     SHERRIE MARSAN is a 88 y.o. female with a history of prior etoh/tobacco abuse, anemia, arthritis, COPD, HLD, IBS and recently diagnosed severe aortic stenosis who presented to [facility] on 08/13/21 for planned TAVR.  Assessment & Plan    Severe AS: s/p successful TAVR with a 23 mm Edwards Sapien 3 Ultra Resilia THV via the right carotid approach on 08/13/21. Post operative echo showed EF 60%, normally functioning TAVR with a mean gradient of 10 mmHg and no PVL and small pericardial effusion. Groin/carotid/subxiphoid sites are stable. ECG with sinus and no high grade heart block. Continue Asprin alone.   Pericardial effusion s/p pericardial window: after valve deployment, the patient remained hypotensive. TEE revealed a large pericardial effusion. Dr. Cyndia Bent placed a pericardial window though the subxiphoid space. Chest tube was  placed and removed the following day. POD1 echo shows a small circumferential pericardial effusion  AKI: creat bumped from 0.7-->1.65, likely due to hypotension. She has been treated with IVF fluids at 75/hr  Hyperkalemia: patient aggressively repleted with potassium. With small body habitus and AKI, she became severely hyperkalemic with a K of 6.7. She was treated with insulin, 10mg  packet of Lokelma and IVFs. This has been trending down. Continue to monitor.   HTN: BP has  been on low side  COPD: stable  Signed, Angelena Form, PA-C  08/15/2021, 1:26 AM  Pager [phone]

## 2021-08-15 NOTE — Progress Notes (Signed)
EVENING ROUNDS NOTE : ? ?   ?[address] ?      [place] [address] ?            [phone]   ?              ?2 Days Post-Op ?Procedure(s) (LRB): ?Transcatheter Aortic Valve Replacement-Carotid (Right) ?TRANSESOPHAGEAL ECHOCARDIOGRAM (TEE) (N/A) ?PERICARDIAL WINDOW ? ? ?Total Length of Stay:  LOS: 2 days  ?Events:   ?No events ?On camode ? ? ? ?BP (!) 114/57   Pulse 94   Temp 98 ?F (36.7 ?C) (Oral)   Resp 19   Ht 5\' 1"  (1.549 m)   Wt 47.8 kg   SpO2 98%   BMI 19.91 kg/m?  ? ?  ? ?  ? ? sodium chloride Stopped (08/14/21 1202)  ? sodium chloride 75 mL/hr at 08/15/21 1400  ? phenylephrine (NEO-SYNEPHRINE) Adult infusion    ? ? ?I/O last 3 completed shifts: ?In: 2148.4 [P.O.:120; I.V.:1943; IV Piggyback:85.4] ?Out: 590 [Urine:400; Chest Tube:190] ? ? ?CBC Latest Ref Rng & Units 08/15/2021 08/14/2021 08/13/2021  ?WBC 4.0 - 10.5 K/uL 10.4 15.7(H) -  ?Hemoglobin 12.0 - 15.0 g/dL 9.2(L) 9.8(L) 10.5(L)  ?Hematocrit 36.0 - 46.0 % 30.7(L) 30.0(L) 31.0(L)  ?Platelets 150 - 400 K/uL 105(L) 139(L) -  ? ? ?BMP Latest Ref Rng & Units 08/15/2021 08/14/2021 08/14/2021  ?Glucose 70 - 99 mg/dL 88 94 93  ?BUN 8 - 23 mg/dL 21 22 20   ?Creatinine 0.44 - 1.00 mg/dL 1.59(H) 1.65(H) 1.52(H)  ?BUN/Creat Ratio 12 - 28 - - -  ?Sodium 135 - 145 mmol/L 136 136 135  ?Potassium 3.5 - 5.1 mmol/L 5.1 6.2(H) 6.7(HH)  ?Chloride 98 - 111 mmol/L 106 107 108  ?CO2 22 - 32 mmol/L 20(L) 23 22  ?Calcium 8.9 - 10.3 mg/dL 8.8(L) 8.7(L) 8.3(L)  ? ? ?ABG ?   ?Component Value Date/Time  ? PHART 7.46 (H) 08/09/2021 1132  ? PCO2ART 41 08/09/2021 1132  ? PO2ART 100 08/09/2021 1132  ? HCO3 29.2 (H) 08/09/2021 1132  ? TCO2 25 08/13/2021 1025  ? ACIDBASEDEF 1.0 07/11/2021 0951  ? O2SAT 99 08/09/2021 1132  ? ? ? ? ? ?Melodie Bouillon, MD ?08/15/2021 4:13 PM ? ? ?

## 2021-08-15 NOTE — Discharge Summary (Addendum)
[facility]   MULTIDISCIPLINARY HEART VALVE TEAM  Discharge Summary    Patient ID: Katherine [name] MRN: [id number]; DOB: 1928/11/01  Admit date: 08/13/2021 Discharge date: 08/19/2021  Primary Care Provider: Elby Showers, MD  Primary Cardiologist: Lauree Chandler, MD   Discharge Diagnoses    Principal Problem:   S/P TAVR (transcatheter aortic valve replacement) Active Problems:   Hyperlipidemia   GE reflux   Chronic back pain   COPD, severe (HCC)   Malnutrition of mild degree (HCC)   Iron deficiency anemia   Severe aortic stenosis   Depression   Pericardial effusion   Status post creation of pericardial window   AKI (acute kidney injury) ([place])   Allergies Allergies  Allergen Reactions   Aspirin Other (See Comments)    Hx of ulcers, stomach issues     Diagnostic Studies/Procedures    TAVR OPERATIVE NOTE     Date of Procedure:                08/13/2021   Preoperative Diagnosis:      Severe Aortic Stenosis    Postoperative Diagnosis:    Same    Procedure:        Transcatheter Aortic Valve Replacement -Right Common Carotid Artery Approach             Edwards Sapien 3 Ultra THV (size 23 mm, model # 9755RSL, serial # [id number])   Sub-xyphoid pericardial window                  Co-Surgeons:                        Gaye Pollack, MD and Lauree Chandler, MD     Anesthesiologist:                  Renaldo Reel, MD   Echocardiographer:              Osborne Oman, MD   Pre-operative Echo Findings: Severe aortic stenosis Normal left ventricular systolic function Small pericardial effusion   Post-operative Echo Findings: No paravalvular leak Normal left ventricular systolic function Large pericardial effusion   _____________________   Echo 08/15/31 IMPRESSIONS  1. The aortic valve has replaced with a 23 mm Edwards Sapien Valve.  Aortic valve regurgitation is not visualized. Effective orifice area, by  VTI measures 1.89 cm.  Aortic valve mean gradient measures 6.0 mmHg. Peak  gradient 10 mm Hg. Acceleration time  60 ms. DVI 0.7   2. Left ventricular ejection fraction, by estimation, is 60 to 65%. The  left ventricle has normal function. The left ventricle has no regional  wall motion abnormalities. Left ventricular diastolic parameters are  consistent with Grade I diastolic  dysfunction (impaired relaxation). Elevated left atrial pressure.   3. Right ventricular systolic function is normal. The right ventricular  size is normal. There is normal pulmonary artery systolic pressure.   4. A small pericardial effusion is present. The pericardial effusion is  circumferential. There is excessive respiratory variation in septal  movement. no other signs of increased pericardial pressure.   5. The mitral valve is degenerative. No evidence of mitral valve  regurgitation. Moderate mild to mitral stenosis. The mean mitral valve  gradient is 4.0 mmHg with average heart rate of 89 bpm. MVA by continuity  equation 1.36 cm2.   6. The inferior vena cava is dilated in size with >50% respiratory  variability, suggesting right atrial pressure of  8 mmHg.   Comparison(s): Stable prosthetic valve parameters. Effusion has improved  from prior.     History of Present Illness     Katherine [name] is a 88 y.o. female with a history of prior etoh/tobacco abuse, anemia, arthritis, COPD, HLD, IBS and recently diagnosed severe aortic stenosis who presented to [facility] on 08/13/21 for planned TAVR.  She has been followed by Dr. Renold Genta with moderate aortic stenosis.  A 2D echocardiogram in January 2019 showed a trileaflet aortic valve with moderate thickening and calcification with a mean gradient of 27 mmHg and a peak gradient of 43 mmHg.  Valve area by VTI was 1.44 cm.  She has had a several year history of exertional fatigue and shortness of breath which was felt to be due to COPD.  She has been treated with bronchodilators but they do not  seem to help that much.  She was being evaluated for hand surgery and the heart murmur was noted and she required a cardiac evaluation.  She had a repeat echocardiogram on 05/21/2021.  This showed a severely calcified aortic valve with a mean gradient that increased to 33.7 mmHg with a peak gradient of 57 mmHg.  Aortic valve area was 0.62 cm by VTI.  Dimensionless index was 0.2.  She underwent cardiac catheterization on 07/11/2021 showing mild nonobstructive coronary disease.  PA pressures were normal.  The patient has been evaluated by the multidisciplinary valve team and felt to have severe, symptomatic aortic stenosis and to be a suitable candidate for TAVR, which was set up for 08/13/21.  Hospital Course     Consultants: none   Severe AS: s/p successful TAVR with a 23 mm Edwards Sapien 3 Ultra Resilia THV via the right carotid approach on 08/13/21. Post operative echo showed EF 60%, normally functioning TAVR with a mean gradient of 10 mmHg and no PVL and small pericardial effusion. Groin/carotid/subxiphoid sites are stable. ECG with sinus and no high grade heart block. Continue Asprin alone. Plan discharge home with close follow up in the office this Friday    Pericardial effusion s/p pericardial window: after valve deployment, the patient remained hypotensive. TEE revealed a large pericardial effusion tamponade. Dr. Cyndia Bent placed a pericardial window though the subxiphoid space. Chest tube was placed and removed the following day. POD1 echo showed a small circumferential pericardial effusion   AKI: creat bumped from 1.65, likely due to periop hypotension related to severe AS, cardiac perforation with hemopericardium and tamponade, contrast.  She was treated with IVFs. Now back to baseline with a creat of 1.02   Hyperkalemia: patient aggressively repleted with potassium. With small body habitus and AKI, she became severely hyperkalemic with a K of 6.7. She was treated with insulin, 10mg  packet of  Lokelma and IVFs. K 4.3 now.    HTN: BP in normal range.    COPD: stable  Physical deconditioning with frailty: OT was consulted for mobilization. She will go home with her husband _____________  Discharge Vitals Blood pressure (!) 144/76, pulse 94, temperature 97.8 F (36.6 C), temperature source Oral, resp. rate 17, height 5\' 1"  (1.549 m), weight 46.2 kg, SpO2 97 %.  Filed Weights   08/18/21 0316 08/18/21 0500 08/19/21 0354  Weight: 47.1 kg 47.4 kg 46.2 kg     GEN: thin and frail HEENT: normal Neck: no JVD or masses Cardiac: RRR; no murmurs, rubs, or gallops,no edema  Respiratory:  clear to auscultation bilaterally, normal work of breathing GI: soft, nontender, nondistended, + BS MS:  no deformity or atrophy Skin: warm and dry, no rash Neuro:  Alert and Oriented x 3, Strength and sensation are intact Psych: euthymic mood, full affect   Labs & Radiologic Studies    CBC Recent Labs    08/17/21 0053  WBC 5.8  HGB 8.9*  HCT 27.7*  MCV 94.5  PLT 277*   Basic Metabolic Panel Recent Labs    08/17/21 0053  NA 141  K 4.3  CL 113*  CO2 24  GLUCOSE 99  BUN 13  CREATININE 1.02*  CALCIUM 8.8*   Liver Function Tests No results for input(s): AST, ALT, ALKPHOS, BILITOT, PROT, ALBUMIN in the last 72 hours. No results for input(s): LIPASE, AMYLASE in the last 72 hours. Cardiac Enzymes No results for input(s): CKTOTAL, CKMB, CKMBINDEX, TROPONINI in the last 72 hours. BNP Invalid input(s): POCBNP D-Dimer No results for input(s): DDIMER in the last 72 hours. Hemoglobin A1C No results for input(s): HGBA1C in the last 72 hours. Fasting Lipid Panel No results for input(s): CHOL, HDL, LDLCALC, TRIG, CHOLHDL, LDLDIRECT in the last 72 hours.  Thyroid Function Tests No results for input(s): TSH, T4TOTAL, T3FREE, THYROIDAB in the last 72 hours.  Invalid input(s): FREET3 _____________  DG Chest 2 View  Result Date: 08/12/2021 CLINICAL DATA:  Preop chest radiograph.  EXAM: CHEST - 2 VIEW COMPARISON:  Chest radiograph dated 08/03/2020. FINDINGS: Background of emphysema. Faint right apical scarring and nodularity. No focal consolidation, pleural effusion, or pneumothorax. The cardiac silhouette is within limits. Atherosclerotic calcification of the aorta. Osteopenia with degenerative changes of the spine. Multilevel old compression fractures and lower thoracic vertebroplasty. No acute osseous pathology. IMPRESSION: 1. No active cardiopulmonary disease. 2. Emphysema. Electronically Signed   By: Anner Crete M.D.   On: 08/12/2021 02:29   CT CORONARY MORPH W/CTA COR W/SCORE W/CA W/CM &/OR WO/CM  Addendum Date: 07/29/2021   ADDENDUM REPORT: 07/29/2021 12:17 CLINICAL DATA:  Aortic stenosis EXAM: Cardiac TAVR CT TECHNIQUE: The patient was scanned on a Siemens Force 412 slice scanner. A 120 kV retrospective scan was triggered in the descending thoracic aorta at 111 HU's. Gantry rotation speed was 270 msecs and collimation was .9 mm. No beta blockade or nitro were given. The 3D data set was reconstructed in 5% intervals of the R-R cycle. Systolic and diastolic phases were analyzed on a dedicated work station using MPR, MIP and VRT modes. The patient received 80 cc of contrast. FINDINGS: Aortic Valve: Tri leaflet calcified with restricted leaflet motion Functionally bi cuspid with partial fusion of left and right cusps Aorta: No aneurysm normal arch vessels moderate calcific atherosclerosis Sinotubular Junction: 25 mm Ascending Thoracic Aorta: 30 mm Aortic Arch: 24 mm Descending Thoracic Aorta: 23 mm Sinus of Valsalva Measurements: Non-coronary: 27.7 mm Right - coronary: 24.2 mm Left - coronary: 27.2 mm Coronary Artery Height above Annulus: Left Main: 12.5 mm above annulus Right Coronary: 12.8 mm above annulus Virtual Basal Annulus Measurements: Maximum/Minimum Diameter: 23.04 mm x 19.4 mm Perimeter: 69 mm Area: 359 mm2 Coronary Arteries: Sufficient height above annulus for  deployment Optimum Fluoroscopic Angle for Delivery: LAO 7 Caudal 13 degrees IMPRESSION: 1. Functionally bi cuspid AV with annular area 359 mm2 suitable for a 23 mm Sapien 3 valve 2.  Coronary arteries sufficient height above annulus for deployment 3.  Membranous septal length 9.6 mm 4.  AV calcium score 2142 5. Optimum angiographic angle for deployment LAO 7 Caudal 13 degrees [place] Electronically Signed   By: Eligha Bridegroom.D.  On: 07/29/2021 12:17   Result Date: 07/29/2021 EXAM: OVER-READ INTERPRETATION  CT CHEST The following report is an over-read performed by radiologist Dr. Salvatore Marvel of [facility], [place] on 07/29/2021. This over-read does not include interpretation of cardiac or coronary anatomy or pathology. The coronary CTA interpretation by the cardiologist is attached. COMPARISON:  08/09/2020 chest CT. FINDINGS: Please see the separate concurrent chest CT angiogram report for details. IMPRESSION: Please see the separate concurrent chest CT angiogram report for details. Electronically Signed: By: Ilona Sorrel M.D. On: 07/29/2021 11:19   DG CHEST PORT 1 VIEW  Result Date: 08/14/2021 CLINICAL DATA:  Chest tube present status post TAVR. Very sore chest. EXAM: PORTABLE CHEST 1 VIEW COMPARISON:  08/13/2021 FINDINGS: There is again a chest tube with tip and side port overlying the inferior mid to right mediastinum/hemithorax. Postsurgical changes are again seen of TAVR. Cardiac silhouette is again at the upper limits of normal size. Mediastinal contours are within normal limits with moderate calcification seen within the aortic arch. Interval decrease in the prior basilar mild interstitial thickening. Lucencies indicating chronic emphysematous changes within the superior greater than inferior lungs similar to prior. No focal airspace opacity to indicate pneumonia. No definite pleural effusion. No pneumothorax is seen. Mild multilevel degenerative disc changes of the thoracic spine. Surgical  clips overlie the upper abdomen. Cement augmentation of the approximate L1 vertebral body. Partial visualization of more inferior lumbar spine posterior fusion hardware. IMPRESSION:: IMPRESSION: 1. Mediastinal drain tip again overlies the inferior right mediastinum. 2. Status post TAVR. 3. Resolution of the prior bibasilar mild interstitial thickening. 4. Chronic emphysematous changes. Electronically Signed   By: Yvonne Kendall M.D.   On: 08/14/2021 08:18   DG Chest Port 1 View  Result Date: 08/13/2021 CLINICAL DATA:  Status post TAVR. EXAM: PORTABLE CHEST 1 VIEW COMPARISON:  Chest x-ray dated August 09, 2021. FINDINGS: Normal heart size status post interval TAVR. Single mediastinal drain noted. New increased interstitial markings at the left greater than right lung bases superimposed on chronic emphysema and interstitial coarsening. No focal consolidation, pleural effusion, or pneumothorax. No acute osseous abnormality. IMPRESSION: 1. Interval TAVR. New bibasilar interstitial edema. 2. COPD. Electronically Signed   By: Titus Dubin M.D.   On: 08/13/2021 10:28   CT ANGIO CHEST AORTA W/CM & OR WO/CM  Result Date: 07/29/2021 CLINICAL DATA:  Aortic valve replacement (TAVR), pre-op eval. EXAM: CT ANGIOGRAPHY CHEST, ABDOMEN AND PELVIS TECHNIQUE: Multidetector CT imaging through the chest, abdomen and pelvis was performed using the standard protocol during bolus administration of intravenous contrast. Multiplanar reconstructed images and MIPs were obtained and reviewed to evaluate the vascular anatomy. RADIATION DOSE REDUCTION: This exam was performed according to the departmental dose-optimization program which includes automated exposure control, adjustment of the mA and/or kV according to patient size and/or use of iterative reconstruction technique. CONTRAST:  53mL OMNIPAQUE IOHEXOL 350 MG/ML SOLN COMPARISON:  08/09/2020 chest CT. 06/23/2019 CT chest, abdomen and pelvis. FINDINGS: CTA CHEST FINDINGS  Cardiovascular: Top-normal heart size. Diffuse thickening and coarse calcification of the aortic valve. No significant pericardial effusion/thickening. Three-vessel coronary atherosclerosis. Atherosclerotic nonaneurysmal thoracic aorta. Normal caliber pulmonary arteries. No central pulmonary emboli. Mediastinum/Nodes: Subcentimeter hypodense bilateral thyroid nodules, unchanged. Not clinically significant; no follow-up imaging recommended (ref: J Am Coll Radiol. 2015 Feb;12(2): 143-50). Unremarkable esophagus. No pathologically enlarged axillary, mediastinal or hilar lymph nodes. Lungs/Pleura: No pneumothorax. No pleural effusion. Severe centrilobular emphysema with mild diffuse bronchial wall thickening. Stable curvilinear parenchymal bands in the posterior right upper  lobe compatible with nonspecific postinfectious/postinflammatory scarring. Solid 0.6 cm peripheral right lower lobe pulmonary nodule (series 5/image 65), stable and considered benign. Small 0.3 cm posterior left upper lobe solid pulmonary nodule (series 5/image 57) along major fissure, decreased from 0.4 cm, considered benign. No acute consolidative airspace disease, lung masses or new significant pulmonary nodules. Musculoskeletal: No aggressive appearing focal osseous lesions. Chronic moderate T8 and T9, mild T10 and moderate T12 vertebral compression fractures. Mild thoracic spondylosis. CTA ABDOMEN AND PELVIS FINDINGS Hepatobiliary: Normal liver size. Scattered simple liver cysts, largest 1.6 cm anteriorly in the left liver lobe. A few additional scattered subcentimeter hypodense liver lesions are too small to characterize. Normal gallbladder with no radiopaque cholelithiasis. No biliary ductal dilatation. Pancreas: Normal, with no mass or significant duct dilation. Spleen: Normal size. No mass. Adrenals/Urinary Tract: Normal adrenals. No hydronephrosis. Subcentimeter hypodense medial upper left renal cortical lesion is too small to characterize  and is unchanged, considered benign. Otherwise no contour deforming renal masses. Normal bladder. Stomach/Bowel: Surgical clips surround the esophagogastric junction. Stomach is nondistended and otherwise normal. Normal caliber small bowel with no small bowel wall thickening. Appendix not discretely visualized. Mild sigmoid diverticulosis, with no large bowel wall thickening or significant pericolonic fat stranding. Vascular/Lymphatic: Atherosclerotic nonaneurysmal abdominal aorta. No pathologically enlarged lymph nodes in the abdomen or pelvis. Reproductive: Status post hysterectomy, with no abnormal findings at the vaginal cuff. No adnexal mass. Other: No pneumoperitoneum. Trace pelvic ascites. No focal fluid collections. Musculoskeletal: No aggressive appearing focal osseous lesions. Chronic severe L1 vertebral compression fracture status post vertebroplasty. Status post bilateral posterior spinal fusion L3-L4. Bone cages in the L3-4 and L4-5 disc spaces. Marked multilevel lumbar degenerative disc disease. VASCULAR MEASUREMENTS PERTINENT TO TAVR: AORTA: Minimal Aortic Diameter-11.5 x 10.9 mm Severity of Aortic Calcification-marked RIGHT PELVIS: Right Common Iliac Artery - Minimal Diameter-6.7 x 5.0 mm Tortuosity-mild Calcification-marked Right External Iliac Artery - Minimal Diameter-4.3 x 3.9 mm Tortuosity-mild Calcification-marked Right Common Femoral Artery - Minimal Diameter-5.1 x 4.2 mm Tortuosity-mild Calcification-marked LEFT PELVIS: Left Common Iliac Artery - Minimal Diameter-7.0 x 6.5 mm Tortuosity-mild Calcification-marked Left External Iliac Artery - Minimal Diameter-5.5 x 4.4 mm Tortuosity-mild Calcification-marked Left Common Femoral Artery - Minimal Diameter-4.6 x 3.8 mm Tortuosity-mild Calcification-marked Review of the MIP images confirms the above findings. IMPRESSION: 1. Vascular findings and measurements pertinent to potential TAVR procedure, as detailed. Note diminutive iliofemoral arteries  bilaterally. 2. Diffuse thickening and coarse calcification of the aortic valve, which often correlates with aortic stenosis. 3. Three-vessel coronary atherosclerosis. 4. Severe centrilobular emphysema with mild diffuse bronchial wall thickening, suggesting COPD. 5. Trace pelvic ascites. 6. Mild sigmoid diverticulosis. 7. Multiple chronic thoracolumbar vertebral compression fractures. 8. Aortic Atherosclerosis (ICD10-I70.0) and Emphysema (ICD10-J43.9). Electronically Signed   By: Ilona Sorrel M.D.   On: 07/29/2021 11:56   ECHOCARDIOGRAM COMPLETE  Result Date: 08/14/2021    ECHOCARDIOGRAM REPORT   Patient Name:   Katherine [name] Date of Exam: 08/14/2021 Medical Rec #:  [id number]         Height:       61.0 in Accession #:    [id number]        Weight:       105.4 lb Date of Birth:  1928/10/02          BSA:          1.439 m Patient Age:    58 years          BP:  80/40 mmHg Patient Gender: F                 HR:           79 bpm. Exam Location:  Inpatient Procedure: 2D Echo, Cardiac Doppler and Color Doppler Indications:    Post TAVR  History:        Patient has prior history of Echocardiogram examinations, most                 recent 08/13/2021. CAD, COPD, Aortic Valve Disease; Risk                 Factors:Former Smoker and Dyslipidemia.                 Aortic Valve: 23 mm Sapien prosthetic, stented (TAVR) valve is                 present in the aortic position. Procedure Date: 08/13/2021.  Sonographer:    Jyl Heinz Referring Phys: [id number] [place]  1. The aortic valve has replaced with a 23 mm Edwards Sapien Valve. Aortic valve regurgitation is not visualized. Effective orifice area, by VTI measures 1.89 cm. Aortic valve mean gradient measures 6.0 mmHg. Peak gradient 10 mm Hg. Acceleration time 60 ms. DVI 0.7  2. Left ventricular ejection fraction, by estimation, is 60 to 65%. The left ventricle has normal function. The left ventricle has no regional wall motion abnormalities. Left  ventricular diastolic parameters are consistent with Grade I diastolic dysfunction (impaired relaxation). Elevated left atrial pressure.  3. Right ventricular systolic function is normal. The right ventricular size is normal. There is normal pulmonary artery systolic pressure.  4. A small pericardial effusion is present. The pericardial effusion is circumferential. There is excessive respiratory variation in septal movement. no other signs of increased pericardial pressure.  5. The mitral valve is degenerative. No evidence of mitral valve regurgitation. Moderate mild to mitral stenosis. The mean mitral valve gradient is 4.0 mmHg with average heart rate of 89 bpm. MVA by continuity equation 1.36 cm2.  6. The inferior vena cava is dilated in size with >50% respiratory variability, suggesting right atrial pressure of 8 mmHg. Comparison(s): Stable prosthetic valve parameters. Effusion has improved from prior. FINDINGS  Left Ventricle: Left ventricular ejection fraction, by estimation, is 60 to 65%. The left ventricle has normal function. The left ventricle has no regional wall motion abnormalities. The left ventricular internal cavity size was normal in size. There is  no left ventricular hypertrophy. Left ventricular diastolic parameters are consistent with Grade I diastolic dysfunction (impaired relaxation). Elevated left atrial pressure. Right Ventricle: The right ventricular size is normal. No increase in right ventricular wall thickness. Right ventricular systolic function is normal. There is normal pulmonary artery systolic pressure. The tricuspid regurgitant velocity is 2.52 m/s, and  with an assumed right atrial pressure of 8 mmHg, the estimated right ventricular systolic pressure is 37.6 mmHg. Left Atrium: Left atrial size was normal in size. Right Atrium: Right atrial size was normal in size. Pericardium: A small pericardial effusion is present. The pericardial effusion is circumferential. There is excessive  respiratory variation in septal movement. Mitral Valve: The mitral valve is degenerative in appearance. No evidence of mitral valve regurgitation. Mild to moderate mitral valve stenosis. MV peak gradient, 8.0 mmHg. The mean mitral valve gradient is 4.0 mmHg with average heart rate of 89 bpm. Tricuspid Valve: The tricuspid valve is not well visualized. Tricuspid valve regurgitation is trivial. No  evidence of tricuspid stenosis. Aortic Valve: 10 mm hG,. The aortic valve has been repaired/replaced. Aortic valve regurgitation is not visualized. Aortic valve mean gradient measures 6.0 mmHg. Aortic valve peak gradient measures 10.3 mmHg. Aortic valve area, by VTI measures 1.89 cm. There is a 23 mm Sapien prosthetic, stented (TAVR) valve present in the aortic position. Procedure Date: 08/13/2021. Pulmonic Valve: The pulmonic valve was grossly normal. Pulmonic valve regurgitation is not visualized. No evidence of pulmonic stenosis. Aorta: The aortic root is normal in size and structure and the ascending aorta was not well visualized. Venous: The inferior vena cava is dilated in size with greater than 50% respiratory variability, suggesting right atrial pressure of 8 mmHg. IAS/Shunts: The interatrial septum was not well visualized.  LEFT VENTRICLE PLAX 2D LVIDd:         4.00 cm     Diastology LVIDs:         2.20 cm     LV e' medial:    4.14 cm/s LV PW:         1.00 cm     LV E/e' medial:  24.2 LV IVS:        0.80 cm     LV e' lateral:   3.00 cm/s LVOT diam:     1.80 cm     LV E/e' lateral: 33.3 LV SV:         56 LV SV Index:   39 LVOT Area:     2.54 cm  LV Volumes (MOD) LV vol d, MOD A2C: 50.9 ml LV vol d, MOD A4C: 56.3 ml LV vol s, MOD A2C: 18.7 ml LV vol s, MOD A4C: 20.4 ml LV SV MOD A2C:     32.2 ml LV SV MOD A4C:     56.3 ml LV SV MOD BP:      34.0 ml RIGHT VENTRICLE            IVC RV Basal diam:  3.10 cm    IVC diam: 2.20 cm RV S prime:     8.60 cm/s TAPSE (M-mode): 1.2 cm LEFT ATRIUM             Index        RIGHT  ATRIUM           Index LA diam:        2.90 cm 2.02 cm/m   RA Area:     13.30 cm LA Vol (A2C):   27.7 ml 19.26 ml/m  RA Volume:   31.30 ml  21.76 ml/m LA Vol (A4C):   28.8 ml 20.02 ml/m LA Biplane Vol: 28.4 ml 19.74 ml/m  AORTIC VALVE AV Area (Vmax):    1.70 cm AV Area (Vmean):   1.73 cm AV Area (VTI):     1.89 cm AV Vmax:           160.50 cm/s AV Vmean:          112.000 cm/s AV VTI:            0.298 m AV Peak Grad:      10.3 mmHg AV Mean Grad:      6.0 mmHg LVOT Vmax:         106.95 cm/s LVOT Vmean:        76.150 cm/s LVOT VTI:          0.222 m LVOT/AV VTI ratio: 0.74  AORTA Ao Asc diam: 2.80 cm MITRAL VALVE  TRICUSPID VALVE MV Area (PHT): 2.83 cm     TR Peak grad:   25.4 mmHg MV Area VTI:   1.42 cm     TR Vmax:        252.00 cm/s MV Peak grad:  8.0 mmHg MV Mean grad:  4.0 mmHg     SHUNTS MV Vmax:       1.41 m/s     Systemic VTI:  0.22 m MV Vmean:      93.4 cm/s    Systemic Diam: 1.80 cm MV Decel Time: 268 msec MV E velocity: 100.00 cm/s MV A velocity: 122.00 cm/s MV E/A ratio:  0.82 Rudean Haskell MD Electronically signed by Rudean Haskell MD Signature Date/Time: 08/14/2021/5:10:50 PM    Final    ECHO TEE  Result Date: 08/13/2021    TRANSESOPHOGEAL ECHO REPORT   Patient Name:   Katherine [name] Date of Exam: 08/13/2021 Medical Rec #:  [id number]         Height:       61.0 in Accession #:    [id number]        Weight:       94.0 lb Date of Birth:  March 14, 1929          BSA:          1.370 m Patient Age:    65 years          BP:           171/73 mmHg Patient Gender: F                 HR:           95 bpm. Exam Location:  Inpatient Procedure: TEE-Intraopertive, 3D Echo, Cardiac Doppler and Color Doppler Indications:     I35.2 Nonrheumatic aortic (valve) stenosis with insufficiency  History:         Patient has prior history of Echocardiogram examinations, most                  recent 05/21/2021. COPD, Aortic Valve Disease; Risk                  Factors:Dyslipidemia. Severe aortic  stenosis.                  Aortic Valve: 23 mm Sapien prosthetic, stented (TAVR) valve is                  present in the aortic position.  Sonographer:     Roseanna Rainbow RDCS Referring Phys:  [place] Diagnosing Phys: Rudean Haskell MD  Sonographer Comments: TAVR procedure. PROCEDURE: After discussion of the risks and benefits of a TEE, an informed consent was obtained from the patient. The transesophogeal probe was passed without difficulty through the esophogus of the patient. Imaged were obtained with the patient in a supine position. Sedation performed by different physician. The patient was monitored while under deep sedation. The patient developed no complications during the procedure. Continuous sedation throughout procedure. IMPRESSIONS  1. Intervential Echo for valve replacement.  2. Prior to procedure, severely calcified aortic valve potential small raphe (functionally bicuspid). Aortic valve regurgitation was mild and eccentric. Mean gradient of 41 mmHg, Peak gradient of 75 mm Hg, DVI 0.3, AVA by continunity equation 0.98 cm2 consistent with severe AS. Small pericardial effusion at the RV apex with on hemodynamic consequence.  3. Post Deployment there was a new moderate to large pericardial effusion first seen at the RV  base then circumferention with decrease LV filling, RV and RA collapse. A pericardial window was performed.  4. Post procedure LV and RV function normalized.  5. There was small residual pericardial effusion without RV or RA collapse.  6. There is a 23 mm Sapien prosthetic (TAVR) valve present in the aortic position. There is no perivalvular leak. Mean gradient of 2 mm Hg, Peak gradient of 3 mm Hg. Effective orifice area 3.33 cm2. DVI 0.87 (overestimated). Suggestive of normally function prosthesis.  7. Left ventricular ejection fraction, by estimation, is 60 to 65%. The left ventricle has normal function.  8. Right ventricular systolic function is normal. The right  ventricular size is normal.  9. No left atrial/left atrial appendage thrombus was detected. 10. Large pericardial effusion. Findings are consistent with cardiac tamponade. 11. The mitral valve is degenerative with anterior leaflet prolapse. Mild mitral valve regurgitation. MVA by continuity equation 2.7 cm2 (mild MS). 12. Evidence of atrial level shunting detected by color flow Doppler. Small PFO.Inter atrial lipomatous hypertrophy. 13. There is mild (Grade II) plaque involving the ascending aorta and descending aorta. Comparison(s): Successful placement of a 23 mm Sapien Valve. FINDINGS  Left Ventricle: Left ventricular ejection fraction, by estimation, is 60 to 65%. The left ventricle has normal function. The left ventricular internal cavity size was normal in size. Right Ventricle: The right ventricular size is normal. No increase in right ventricular wall thickness. Right ventricular systolic function is normal. Left Atrium: Left atrial size was normal in size. No left atrial/left atrial appendage thrombus was detected. Right Atrium: Right atrial size was normal in size. Pericardium: A large pericardial effusion is present. There is evidence of cardiac tamponade. Mitral Valve: The mitral valve is degenerative in appearance. Mild mitral valve regurgitation. MV peak gradient, 10.5 mmHg. The mean mitral valve gradient is 6.0 mmHg. Tricuspid Valve: The tricuspid valve is normal in structure. Tricuspid valve regurgitation is trivial. Aortic Valve: The aortic valve has been repaired/replaced. Aortic valve regurgitation is mild. Aortic valve mean gradient measures 19.8 mmHg. Aortic valve peak gradient measures 28.7 mmHg. Aortic valve area, by VTI measures 1.31 cm. There is a 23 mm Sapien prosthetic, stented (TAVR) valve present in the aortic position. Pulmonic Valve: The pulmonic valve was not well visualized. Pulmonic valve regurgitation is trivial. Aorta: The aortic root and ascending aorta are structurally normal,  with no evidence of dilitation. There is mild (Grade II) plaque involving the ascending aorta and descending aorta. IAS/Shunts: The interatrial septum appears to be lipomatous. Evidence of atrial level shunting detected by color flow Doppler.  LEFT VENTRICLE PLAX 2D LVOT diam:     1.92 cm LV SV:         70 LV SV Index:   51 LVOT Area:     2.90 cm  AORTIC VALVE AV Area (Vmax):    1.26 cm AV Area (Vmean):   1.23 cm AV Area (VTI):     1.31 cm AV Vmax:           267.73 cm/s AV Vmean:          167.900 cm/s AV VTI:            0.539 m AV Peak Grad:      28.7 mmHg AV Mean Grad:      19.8 mmHg LVOT Vmax:         116.70 cm/s LVOT Vmean:        71.133 cm/s LVOT VTI:  0.243 m LVOT/AV VTI ratio: 0.45 MITRAL VALVE MV Area VTI:  2.40 cm    SHUNTS MV Peak grad: 10.5 mmHg   Systemic VTI:  0.24 m MV Mean grad: 6.0 mmHg    Systemic Diam: 1.92 cm MV Vmax:      1.62 m/s MV Vmean:     118.0 cm/s Rudean Haskell MD Electronically signed by Rudean Haskell MD Signature Date/Time: 08/13/2021/4:21:31 PM    Final    Structural Heart Procedure  Result Date: 08/13/2021 See surgical note for result.  CT Angio Abd/Pel w/ and/or w/o  Result Date: 07/29/2021 CLINICAL DATA:  Aortic valve replacement (TAVR), pre-op eval. EXAM: CT ANGIOGRAPHY CHEST, ABDOMEN AND PELVIS TECHNIQUE: Multidetector CT imaging through the chest, abdomen and pelvis was performed using the standard protocol during bolus administration of intravenous contrast. Multiplanar reconstructed images and MIPs were obtained and reviewed to evaluate the vascular anatomy. RADIATION DOSE REDUCTION: This exam was performed according to the departmental dose-optimization program which includes automated exposure control, adjustment of the mA and/or kV according to patient size and/or use of iterative reconstruction technique. CONTRAST:  16mL OMNIPAQUE IOHEXOL 350 MG/ML SOLN COMPARISON:  08/09/2020 chest CT. 06/23/2019 CT chest, abdomen and pelvis. FINDINGS: CTA  CHEST FINDINGS Cardiovascular: Top-normal heart size. Diffuse thickening and coarse calcification of the aortic valve. No significant pericardial effusion/thickening. Three-vessel coronary atherosclerosis. Atherosclerotic nonaneurysmal thoracic aorta. Normal caliber pulmonary arteries. No central pulmonary emboli. Mediastinum/Nodes: Subcentimeter hypodense bilateral thyroid nodules, unchanged. Not clinically significant; no follow-up imaging recommended (ref: J Am Coll Radiol. 2015 Feb;12(2): 143-50). Unremarkable esophagus. No pathologically enlarged axillary, mediastinal or hilar lymph nodes. Lungs/Pleura: No pneumothorax. No pleural effusion. Severe centrilobular emphysema with mild diffuse bronchial wall thickening. Stable curvilinear parenchymal bands in the posterior right upper lobe compatible with nonspecific postinfectious/postinflammatory scarring. Solid 0.6 cm peripheral right lower lobe pulmonary nodule (series 5/image 65), stable and considered benign. Small 0.3 cm posterior left upper lobe solid pulmonary nodule (series 5/image 57) along major fissure, decreased from 0.4 cm, considered benign. No acute consolidative airspace disease, lung masses or new significant pulmonary nodules. Musculoskeletal: No aggressive appearing focal osseous lesions. Chronic moderate T8 and T9, mild T10 and moderate T12 vertebral compression fractures. Mild thoracic spondylosis. CTA ABDOMEN AND PELVIS FINDINGS Hepatobiliary: Normal liver size. Scattered simple liver cysts, largest 1.6 cm anteriorly in the left liver lobe. A few additional scattered subcentimeter hypodense liver lesions are too small to characterize. Normal gallbladder with no radiopaque cholelithiasis. No biliary ductal dilatation. Pancreas: Normal, with no mass or significant duct dilation. Spleen: Normal size. No mass. Adrenals/Urinary Tract: Normal adrenals. No hydronephrosis. Subcentimeter hypodense medial upper left renal cortical lesion is too small  to characterize and is unchanged, considered benign. Otherwise no contour deforming renal masses. Normal bladder. Stomach/Bowel: Surgical clips surround the esophagogastric junction. Stomach is nondistended and otherwise normal. Normal caliber small bowel with no small bowel wall thickening. Appendix not discretely visualized. Mild sigmoid diverticulosis, with no large bowel wall thickening or significant pericolonic fat stranding. Vascular/Lymphatic: Atherosclerotic nonaneurysmal abdominal aorta. No pathologically enlarged lymph nodes in the abdomen or pelvis. Reproductive: Status post hysterectomy, with no abnormal findings at the vaginal cuff. No adnexal mass. Other: No pneumoperitoneum. Trace pelvic ascites. No focal fluid collections. Musculoskeletal: No aggressive appearing focal osseous lesions. Chronic severe L1 vertebral compression fracture status post vertebroplasty. Status post bilateral posterior spinal fusion L3-L4. Bone cages in the L3-4 and L4-5 disc spaces. Marked multilevel lumbar degenerative disc disease. VASCULAR MEASUREMENTS PERTINENT TO TAVR: AORTA: Minimal Aortic Diameter-11.5  x 10.9 mm Severity of Aortic Calcification-marked RIGHT PELVIS: Right Common Iliac Artery - Minimal Diameter-6.7 x 5.0 mm Tortuosity-mild Calcification-marked Right External Iliac Artery - Minimal Diameter-4.3 x 3.9 mm Tortuosity-mild Calcification-marked Right Common Femoral Artery - Minimal Diameter-5.1 x 4.2 mm Tortuosity-mild Calcification-marked LEFT PELVIS: Left Common Iliac Artery - Minimal Diameter-7.0 x 6.5 mm Tortuosity-mild Calcification-marked Left External Iliac Artery - Minimal Diameter-5.5 x 4.4 mm Tortuosity-mild Calcification-marked Left Common Femoral Artery - Minimal Diameter-4.6 x 3.8 mm Tortuosity-mild Calcification-marked Review of the MIP images confirms the above findings. IMPRESSION: 1. Vascular findings and measurements pertinent to potential TAVR procedure, as detailed. Note diminutive  iliofemoral arteries bilaterally. 2. Diffuse thickening and coarse calcification of the aortic valve, which often correlates with aortic stenosis. 3. Three-vessel coronary atherosclerosis. 4. Severe centrilobular emphysema with mild diffuse bronchial wall thickening, suggesting COPD. 5. Trace pelvic ascites. 6. Mild sigmoid diverticulosis. 7. Multiple chronic thoracolumbar vertebral compression fractures. 8. Aortic Atherosclerosis (ICD10-I70.0) and Emphysema (ICD10-J43.9). Electronically Signed   By: Ilona Sorrel M.D.   On: 07/29/2021 11:56   Disposition   Pt is being discharged home today in good condition.  Follow-up Plans & Appointments     Follow-up Information     Eileen Stanford, PA-C. Go on 08/23/2021.   Specialties: Cardiology, Radiology Why: @ 1pm, please arrive at least 10 minutes early Contact information: [place] [address] [phone]                  Discharge Medications   Allergies as of 08/19/2021       Reactions   Aspirin Other (See Comments)   Hx of ulcers, stomach issues        Medication List     TAKE these medications    albuterol 108 (90 Base) MCG/ACT inhaler Commonly known as: VENTOLIN HFA INHALE 2 PUFFS INTO THE LUNGS 4 (FOUR) TIMES DAILY AS NEEDED FOR WHEEZING OR SHORTNESS OF BREATH.   ALPRAZolam 0.25 MG tablet Commonly known as: XANAX TAKE ONE TAB UP TO TWICE DAILY AS NEEDED FOR ANXIETY AND SLEEP What changed: See the new instructions.   arformoterol 15 MCG/2ML Nebu Commonly known as: BROVANA Take 2 mLs (15 mcg total) by nebulization 2 (two) times daily.   aspirin 81 MG chewable tablet Chew 1 tablet (81 mg total) by mouth daily.   budesonide 0.5 MG/2ML nebulizer solution Commonly known as: PULMICORT Take 0.5 mg by nebulization daily as needed (shortness of breath).   escitalopram 5 MG tablet Commonly known as: LEXAPRO TAKE 1 TABLET BY MOUTH EVERY DAY   fluticasone 50 MCG/ACT nasal  spray Commonly known as: FLONASE Place 1 spray into both nostrils daily. What changed:  when to take this reasons to take this   ipratropium 0.02 % nebulizer solution Commonly known as: ATROVENT TAKE 2.5 MLS (0.5 MG TOTAL) BY NEBULIZATION 4 (FOUR) TIMES DAILY. What changed:  when to take this reasons to take this   Melatonin 10 MG Caps Take 10 mg by mouth at bedtime.   multivitamin with minerals tablet Take 1 tablet by mouth daily.   pantoprazole 40 MG tablet Commonly known as: Protonix Take 1 tablet (40 mg total) by mouth daily.   POLYSACCHARIDE IRON PO Take 1 capsule by mouth daily.   sucralfate 1 GM/10ML suspension Commonly known as: Carafate Take 10 mLs (1 g total) by mouth 4 (four) times daily -  with meals and at bedtime.   traMADol 50 MG tablet Commonly known as: ULTRAM Take  50 mg by mouth every 6 (six) hours as needed for moderate pain.   traZODone 50 MG tablet Commonly known as: DESYREL TAKE 3 TABLETS BY MOUTH AT BEDTIME What changed: how much to take   VITAMIN B-12 PO Take 1 tablet by mouth daily.            Outstanding Labs/Studies   BMET  Duration of Discharge Encounter   Greater than 30 minutes including physician time.  Mable Fill, PA-C 08/19/2021, 9:13 AM [phone]

## 2021-08-16 DIAGNOSIS — Z954 Presence of other heart-valve replacement: Secondary | ICD-10-CM

## 2021-08-16 DIAGNOSIS — I350 Nonrheumatic aortic (valve) stenosis: Secondary | ICD-10-CM

## 2021-08-16 LAB — CBC
HCT: 25.8 % — ABNORMAL LOW (ref 36.0–46.0)
Hemoglobin: 8 g/dL — ABNORMAL LOW (ref 12.0–15.0)
MCH: 30.1 pg (ref 26.0–34.0)
MCHC: 31 g/dL (ref 30.0–36.0)
MCV: 97 fL (ref 80.0–100.0)
Platelets: 83 10*3/uL — ABNORMAL LOW (ref 150–400)
RBC: 2.66 MIL/uL — ABNORMAL LOW (ref 3.87–5.11)
RDW: 13 % (ref 11.5–15.5)
WBC: 5.7 10*3/uL (ref 4.0–10.5)
nRBC: 0 % (ref 0.0–0.2)

## 2021-08-16 LAB — BASIC METABOLIC PANEL
Anion gap: 4 — ABNORMAL LOW (ref 5–15)
BUN: 16 mg/dL (ref 8–23)
CO2: 20 mmol/L — ABNORMAL LOW (ref 22–32)
Calcium: 8.4 mg/dL — ABNORMAL LOW (ref 8.9–10.3)
Chloride: 114 mmol/L — ABNORMAL HIGH (ref 98–111)
Creatinine, Ser: 1.18 mg/dL — ABNORMAL HIGH (ref 0.44–1.00)
GFR, Estimated: 44 mL/min — ABNORMAL LOW (ref >60)
Glucose, Bld: 93 mg/dL (ref 70–99)
Potassium: 4.4 mmol/L (ref 3.5–5.1)
Sodium: 138 mmol/L (ref 135–145)

## 2021-08-16 MED ORDER — TRAZODONE HCL 50 MG PO TABS
50.0000 mg | ORAL_TABLET | Freq: Every day | ORAL | Status: DC
  Administered 2021-08-16 – 2021-08-18 (×2): 50 mg via ORAL
  Filled 2021-08-16 (×3): qty 1

## 2021-08-16 MED ORDER — FE FUMARATE-B12-VIT C-FA-IFC PO CAPS
1.0000 | ORAL_CAPSULE | Freq: Two times a day (BID) | ORAL | Status: DC
  Administered 2021-08-16 – 2021-08-19 (×7): 1 via ORAL
  Filled 2021-08-16 (×7): qty 1

## 2021-08-16 NOTE — Progress Notes (Signed)
CSW received consult for possible SNF placement for patient at time of discharge. Due to patients current orientation CSW  called patients spouse [name], no answer. CSW LVM and awaiting callback to discuss dc plan for patient. CSW to continue to follow and assist with discharge planning needs.  ?

## 2021-08-16 NOTE — Progress Notes (Signed)
3 Days Post-Op Procedure(s) (LRB): ?Transcatheter Aortic Valve Replacement-Carotid (Right) ?TRANSESOPHAGEAL ECHOCARDIOGRAM (TEE) (N/A) ?PERICARDIAL WINDOW ?Subjective: ?No complaints but just waking up. Reportedly confused and a little agitated last night but slept well after Xanax and Trazodone. ? ?Objective: ?Vital signs in last 24 hours: ?Temp:  [98 ?F (36.7 ?C)-98.1 ?F (36.7 ?C)] 98.1 ?F (36.7 ?C) (03/03 0400) ?Pulse Rate:  [79-105] 81 (03/03 0700) ?Cardiac Rhythm: Normal sinus rhythm (03/03 0400) ?Resp:  [14-27] 15 (03/03 0700) ?BP: (92-127)/(49-100) 124/51 (03/03 0700) ?SpO2:  [93 %-100 %] 100 % (03/03 0700) ? ?Hemodynamic parameters for last 24 hours: ?  ? ?Intake/Output from previous day: ?03/02 0701 - 03/03 0700 ?In: 1162.3 [P.O.:180; I.V.:982.3] ?Out: 880 [Urine:880] ?Intake/Output this shift: ?No intake/output data recorded. ? ?General appearance: slowed mentation ?Neurologic: intact ?Heart: regular rate and rhythm ?Lungs: clear to auscultation bilaterally ?Abdomen: soft, non-tender; bowel sounds normal ?Extremities: edema minimal ?Wound: incisions ok ? ?Lab Results: ?Recent Labs  ?  08/15/21 ?0602 08/16/21 ?5400  ?WBC 10.4 5.7  ?HGB 9.2* 8.0*  ?HCT 30.7* 25.8*  ?PLT 105* 83*  ? ?BMET:  ?Recent Labs  ?  08/15/21 ?0602 08/16/21 ?0219  ?NA 136 138  ?K 5.1 4.4  ?CL 106 114*  ?CO2 20* 20*  ?GLUCOSE 88 93  ?BUN 21 16  ?CREATININE 1.59* 1.18*  ?CALCIUM 8.8* 8.4*  ?  ?PT/INR: No results for input(s): LABPROT, INR in the last 72 hours. ?ABG ?   ?Component Value Date/Time  ? PHART 7.46 (H) 08/09/2021 1132  ? HCO3 29.2 (H) 08/09/2021 1132  ? TCO2 25 08/13/2021 1025  ? ACIDBASEDEF 1.0 07/11/2021 0951  ? O2SAT 99 08/09/2021 1132  ? ?CBG (last 3)  ?No results for input(s): GLUCAP in the last 72 hours. ? ? ?Assessment/Plan: ?S/P Procedure(s) (LRB): ?Transcatheter Aortic Valve Replacement-Carotid (Right) ?TRANSESOPHAGEAL ECHOCARDIOGRAM (TEE) (N/A) ?PERICARDIAL WINDOW ? ?POD 3 ? ?Hemodynamically stable and BP 120's this  am. Sinus rhythm. ? ?Making adequate urine now. +282 cc/24 hrs. ? ?Creat almost back to baseline. DC IVF. ? ?Needs PT for ambulation. ? ? LOS: 3 days  ? ? ?Gaye Pollack ?08/16/2021 ? ? ?

## 2021-08-16 NOTE — Evaluation (Signed)
Physical Therapy Evaluation Patient Details Name: Katherine [name] MRN: [id number] DOB: Feb 06, 1929 Today's Date: 08/16/2021  History of Present Illness  Pt is a 88 y.o. female who presented 08/13/21 for TEE and TAVR. Chest tube placed 2/28. PMH: anemia, arthritis, asthma, COPD, severe aortic stenosis   Clinical Impression  Pt presents with condition above and deficits mentioned below, see PT Problem List. PTA, she was mod I using a rollator for household distances, fatiguing quickly. When in the community or on the stairs she holds onto her husband for support. Pt lives with her husband in a 2-level house with 5 STE and with a stair lift to their bedroom/bathroom inside. Currently, pt displays deficits in cognition, balance, strength, and activity tolerance placing her at high risk for falls. Pt required modA to come to stand and min-modA to steady walking a short distance in her room today. She was dependent for RW management when ambulating. Pt tremulous throughout with difficulty initiating and advancing legs, almost Parkinsonian-like gait pattern. Husband is hopeful for pt to return home. At this time, recommending short-term rehab at a SNF prior to return home to improve her independence and safety with all functional mobility. Will continue to follow acutely.     Recommendations for follow up therapy are one component of a multi-disciplinary discharge planning process, led by the attending physician.  Recommendations may be updated based on patient status, additional functional criteria and insurance authorization.  Follow Up Recommendations Skilled nursing-short term rehab (<3 hours/day)    Assistance Recommended at Discharge Frequent or constant Supervision/Assistance  Patient can return home with the following  A lot of help with walking and/or transfers;A lot of help with bathing/dressing/bathroom;Assistance with cooking/housework;Assistance with feeding;Direct supervision/assist for  medications management;Direct supervision/assist for financial management;Assist for transportation;Help with stairs or ramp for entrance    Equipment Recommendations None recommended by PT  Recommendations for Other Services  OT consult    Functional Status Assessment Patient has had a recent decline in their functional status and demonstrates the ability to make significant improvements in function in a reasonable and predictable amount of time.     Precautions / Restrictions Precautions Precautions: Fall;Other (comment) Precaution Comments: monitor SpO2 Restrictions Weight Bearing Restrictions: No      Mobility  Bed Mobility               General bed mobility comments: Pt up in recliner upon arrival.    Transfers Overall transfer level: Needs assistance Equipment used: Rolling walker (2 wheels) Transfers: Sit to/from Stand Sit to Stand: Mod assist           General transfer comment: Pt needing increased time and use of bed pad to scoot to edge of seat. Hand over hand cues to push up from recliner arm rests. ModA to power up and extend hips.    Ambulation/Gait Ambulation/Gait assistance: Min assist, Mod assist Gait Distance (Feet): 18 Feet Assistive device: Rolling walker (2 wheels) Gait Pattern/deviations: Step-to pattern, Decreased step length - right, Decreased step length - left, Decreased stride length, Trunk flexed, Shuffle Gait velocity: reduced Gait velocity interpretation: <1.31 ft/sec, indicative of household ambulator   General Gait Details: Pt with slow, shuffling gait, needing tactile cues at posterior aspects of legs to advance either leg, having difficulty advancing. Almost Parkinsonian-like gait pattern. Pt with trunk flexed posture throughout. Min-modA physically for support. Continual assistance needed to manage RW  Science writer  Modified Rankin (Stroke Patients Only)       Balance Overall balance  assessment: Needs assistance Sitting-balance support: Bilateral upper extremity supported, Feet supported Sitting balance-Leahy Scale: Poor Sitting balance - Comments: Pt with posterior and R lean sitting in recliner. Postural control: Posterior lean Standing balance support: Bilateral upper extremity supported, Reliant on assistive device for balance, During functional activity Standing balance-Leahy Scale: Poor Standing balance comment: Reliant on UE support and external physical assistance.                             Pertinent Vitals/Pain Pain Assessment Pain Assessment: Faces Faces Pain Scale: Hurts little more Pain Location: R neck, generalized with mobility Pain Descriptors / Indicators: Grimacing Pain Intervention(s): Limited activity within patient's tolerance, Monitored during session, Repositioned    Home Living Family/patient expects to be discharged to:: Private residence Living Arrangements: Spouse/significant other Available Help at Discharge: Family;Available 24 hours/day Type of Home: House Home Access: Stairs to enter Entrance Stairs-Rails: Right (ascending) Entrance Stairs-Number of Steps: 5 Alternate Level Stairs-Number of Steps: flight (uses stair lift) Home Layout: Two level;Bed/bath upstairs Home Equipment: Conservation officer, nature (2 wheels);Rollator (4 wheels);BSC/3in1;Shower seat;Grab bars - tub/shower;Grab bars - toilet;Wheelchair - manual;Other (comment) (stair lift) Additional Comments: No supplementary O2 at baseline    Prior Function Prior Level of Function : Needs assist       Physical Assist : Mobility (physical);ADLs (physical) Mobility (physical): Gait;Stairs ADLs (physical): Bathing;Dressing;IADLs Mobility Comments: Pt mod I with rollator in home, relies on holding onto husband for support outside home. Husband assists on stairs outside. Pt uses stair lift inside with SUP. Pt fatigues quickly. No falls in past year. ADLs Comments: Husband  assists with bathing and dressing due to pt needing a lot of time to complete on her own. Husband does the driving, cooking, cleaning, and shopping.     Hand Dominance        Extremity/Trunk Assessment   Upper Extremity Assessment Upper Extremity Assessment: Defer to OT evaluation    Lower Extremity Assessment Lower Extremity Assessment: Generalized weakness    Cervical / Trunk Assessment Cervical / Trunk Assessment: Kyphotic  Communication   Communication: HOH  Cognition Arousal/Alertness: Lethargic Behavior During Therapy: Flat affect Overall Cognitive Status: Impaired/Different from baseline Area of Impairment: Attention, Memory, Following commands, Safety/judgement, Awareness, Problem solving                   Current Attention Level: Focused Memory: Decreased short-term memory Following Commands: Follows one step commands with increased time Safety/Judgement: Decreased awareness of safety, Decreased awareness of deficits Awareness: Intellectual Problem Solving: Slow processing, Decreased initiation, Difficulty sequencing, Requires verbal cues, Requires tactile cues General Comments: Pt lethargic, having difficulty staying awake initially. Pt needing repeated cues to initiate tasks. Increased time to process cues with multi-modal cues and physical assistance to complete task at hand. Difficulty sequencing steps and RW        General Comments General comments (skin integrity, edema, etc.): SpO2 decreased to low 80s% on RA with mobility, needing 3L to rebound to 90s%; tremulous throughout session    Exercises     Assessment/Plan    PT Assessment Patient needs continued PT services  PT Problem List Decreased strength;Decreased activity tolerance;Decreased range of motion;Decreased balance;Decreased mobility;Decreased cognition;Cardiopulmonary status limiting activity       PT Treatment Interventions DME instruction;Gait training;Stair training;Functional  mobility training;Therapeutic activities;Therapeutic exercise;Balance training;Neuromuscular re-education;Patient/family education;Cognitive remediation    PT Goals (Current goals  can be found in the Care Plan section)  Acute Rehab PT Goals Patient Stated Goal: to go home PT Goal Formulation: With patient/family Time For Goal Achievement: 08/30/21 Potential to Achieve Goals: Good    Frequency Min 3X/week     Co-evaluation               AM-PAC PT "6 Clicks" Mobility  Outcome Measure Help needed turning from your back to your side while in a flat bed without using bedrails?: A Little Help needed moving from lying on your back to sitting on the side of a flat bed without using bedrails?: A Lot Help needed moving to and from a bed to a chair (including a wheelchair)?: A Lot Help needed standing up from a chair using your arms (e.g., wheelchair or bedside chair)?: A Lot Help needed to walk in hospital room?: A Lot Help needed climbing 3-5 steps with a railing? : Total 6 Click Score: 12    End of Session Equipment Utilized During Treatment: Gait belt;Oxygen Activity Tolerance: Patient limited by fatigue Patient left: in chair;with call bell/phone within reach;with chair alarm set;with family/visitor present Nurse Communication: Mobility status;Other (comment) (sats) PT Visit Diagnosis: Unsteadiness on feet (R26.81);Other abnormalities of gait and mobility (R26.89);Muscle weakness (generalized) (M62.81);Difficulty in walking, not elsewhere classified (R26.2)    Time: 8299-3716 PT Time Calculation (min) (ACUTE ONLY): 38 min   Charges:   PT Evaluation $PT Eval Moderate Complexity: 1 Mod PT Treatments $Gait Training: 8-22 mins $Therapeutic Activity: 8-22 mins        Moishe Spice, PT, DPT Acute Rehabilitation Services  Pager: [phone] Office: [place] 08/16/2021, 3:59 PM

## 2021-08-16 NOTE — Care Management (Signed)
?  Transition of Care (TOC) Screening Note ? ? ?Patient Details  ?Name: Katherine [name] ?Date of Birth: [birth year] ? ? ?Transition of Care (TOC) CM/SW Contact:    ?Graves-Bigelow, Ocie Cornfield, RN ?Phone Number: ?08/16/2021, 2:30 PM ? ? ? ?Transition of Care Department [facility]) has reviewed patient and no TOC needs have been identified at this time. We will continue to monitor patient advancement through interdisciplinary progression rounds. If new patient transition needs arise, please place a TOC consult. ?  ?

## 2021-08-17 LAB — BASIC METABOLIC PANEL
Anion gap: 4 — ABNORMAL LOW (ref 5–15)
BUN: 13 mg/dL (ref 8–23)
CO2: 24 mmol/L (ref 22–32)
Calcium: 8.8 mg/dL — ABNORMAL LOW (ref 8.9–10.3)
Chloride: 113 mmol/L — ABNORMAL HIGH (ref 98–111)
Creatinine, Ser: 1.02 mg/dL — ABNORMAL HIGH (ref 0.44–1.00)
GFR, Estimated: 52 mL/min — ABNORMAL LOW (ref >60)
Glucose, Bld: 99 mg/dL (ref 70–99)
Potassium: 4.3 mmol/L (ref 3.5–5.1)
Sodium: 141 mmol/L (ref 135–145)

## 2021-08-17 LAB — CBC
HCT: 27.7 % — ABNORMAL LOW (ref 36.0–46.0)
Hemoglobin: 8.9 g/dL — ABNORMAL LOW (ref 12.0–15.0)
MCH: 30.4 pg (ref 26.0–34.0)
MCHC: 32.1 g/dL (ref 30.0–36.0)
MCV: 94.5 fL (ref 80.0–100.0)
Platelets: 112 10*3/uL — ABNORMAL LOW (ref 150–400)
RBC: 2.93 MIL/uL — ABNORMAL LOW (ref 3.87–5.11)
RDW: 12.7 % (ref 11.5–15.5)
WBC: 5.8 10*3/uL (ref 4.0–10.5)
nRBC: 0 % (ref 0.0–0.2)

## 2021-08-17 MED ORDER — SODIUM CHLORIDE 0.9% FLUSH: 3.0000 mL | INTRAVENOUS | Status: DC | PRN

## 2021-08-17 MED ORDER — SODIUM CHLORIDE 0.9 % IV SOLN: 250.0000 mL | INTRAVENOUS | Status: DC | PRN

## 2021-08-17 MED ORDER — SODIUM CHLORIDE 0.9% FLUSH
3.0000 mL | Freq: Two times a day (BID) | INTRAVENOUS | Status: DC
  Administered 2021-08-18 – 2021-08-19 (×3): 3 mL via INTRAVENOUS

## 2021-08-17 NOTE — TOC Progression Note (Signed)
Transition of Care (TOC) - Progression Note  ? ? ?Patient Details  ?Name: Katherine [name] ?MRN: [id number] ?Date of Birth: [birth year] ? ?Transition of Care (TOC) CM/SW Contact  ?[place], LCSWA ?Phone Number: ?08/17/2021, 11:20 AM ? ?Clinical Narrative:    ? ?CSW spoke with pt's spouse [name]. [name] states pt is working with OT right now and is stronger than she was yesterday, so pt may be going home. [name] asked what would happen if they got home and it was too much, CSW did advise once pt dc, pt would have to go through PCP for SNF placement. [name] states he understands and kindly requests that CSW follow up with him in 24 hours, but based on interactions with OT today he believes pt may be going home. CSW will follow up with [name] tomorrow.  ? ?  ?  ? ?Expected Discharge Plan and Services ?  ?  ?  ?  ?  ?                ?  ?  ?  ?  ?  ?  ?  ?  ?  ?  ? ? ?Social Determinants of Health (SDOH) Interventions ?  ? ?Readmission Risk Interventions ?No flowsheet data found. ? ?

## 2021-08-17 NOTE — Evaluation (Signed)
Occupational Therapy Evaluation Patient Details Name: Katherine [name] MRN: [id number] DOB: May 10, 1929 Today's Date: 08/17/2021   History of Present Illness Pt is a 88 y.o. female who presented 08/13/21 for TEE and TAVR. Chest tube placed 2/28. PMH: anemia, arthritis, asthma, COPD, severe aortic stenosis   Clinical Impression   PTA, pt lives with spouse, typically ambulatory with Rollator, able to toilet/bathe self without assist though husband provides supervision for safety. Intermittent assist for dressing tasks prn at baseline. Pt presents now with deficits in strength, standing balance, and cardiopulmonary endurance. Pt able to demonstrate transfers and short mobility in room using RW with no more than min guard assist. Pt requires Min A for UB ADLs and Mod A for LB ADLs due to deficits. Husband present, supportive and able to provide light assist for pt needs at home. Educated re: use of RW initially for mobility (more stable than Rollator), BSC use at night to decrease fall risk with nighttime bathroom trips, close hands on assist for ADLs/mobility initially at home, and energy conservation (emphasis on pursed lip breathing). Both were pleased with pt's functional progress today. Based on current presentation and family support at home, would recommend HHOT at DC.    SpO2 97% on 1 L O2 at rest, 93% on RA at rest. Desats to 86% with mobility and 2/4 DOE noted. With pursed lip breathing, increases to 92-93% easily though hovers at 88-89% with talking. Baseline COPD and use of nebulizer 3-4x/day at home per spouse.     Recommendations for follow up therapy are one component of a multi-disciplinary discharge planning process, led by the attending physician.  Recommendations may be updated based on patient status, additional functional criteria and insurance authorization.   Follow Up Recommendations  Home health OT    Assistance Recommended at Discharge Frequent or constant Supervision/Assistance   Patient can return home with the following A little help with walking and/or transfers;A little help with bathing/dressing/bathroom;Help with stairs or ramp for entrance;Assist for transportation    Functional Status Assessment  Patient has had a recent decline in their functional status and demonstrates the ability to make significant improvements in function in a reasonable and predictable amount of time.  Equipment Recommendations  None recommended by OT (appears well equipped)    Recommendations for Other Services       Precautions / Restrictions Precautions Precautions: Fall;Other (comment) Precaution Comments: monitor SpO2 Restrictions Weight Bearing Restrictions: No      Mobility Bed Mobility               General bed mobility comments: Pt up in recliner upon arrival.    Transfers Overall transfer level: Needs assistance Equipment used: Rolling walker (2 wheels) Transfers: Sit to/from Stand Sit to Stand: Min guard           General transfer comment: tendency to pull on RW, consistent cues needed for pushing up from armrests but able to do so with increased time and without physical assist      Balance Overall balance assessment: Needs assistance Sitting-balance support: Feet supported, No upper extremity supported Sitting balance-Leahy Scale: Fair     Standing balance support: Bilateral upper extremity supported, Reliant on assistive device for balance, During functional activity Standing balance-Leahy Scale: Poor Standing balance comment: Reliant on UE support with mobility                           ADL either performed or assessed with clinical  judgement   ADL Overall ADL's : Needs assistance/impaired Eating/Feeding: Set up;Sitting Eating/Feeding Details (indicate cue type and reason): though husband assisting this AM Grooming: Set up;Sitting   Upper Body Bathing: Minimal assistance;Sitting   Lower Body Bathing: Moderate  assistance;Sit to/from stand   Upper Body Dressing : Minimal assistance;Sitting   Lower Body Dressing: Moderate assistance;Sit to/from stand   Toilet Transfer: Min guard;Ambulation;BSC/3in1;Rolling walker (2 wheels)   Toileting- Clothing Manipulation and Hygiene: Moderate assistance;Sit to/from stand;Sitting/lateral lean       Functional mobility during ADLs: Min guard;Rolling walker (2 wheels) General ADL Comments: Limited by baseline endurance deficits (COPD with SOB and rest breaks needed). Husband/pt really want to go home; time spent collaborating on DME at home, safety strategies for ADLs (sponge bathing on a "bad" day rather than attempting shower, BSC use at night to decrease fall risk with nighttime trips to bathroom), use of wheelchair outside of home initially while progressing endurance with [place] Baseline Vision/History: 1 Wears glasses Ability to See in Adequate Light: 1 Impaired Patient Visual Report: No change from baseline Vision Assessment?: No apparent visual deficits     Perception     Praxis      Pertinent Vitals/Pain Pain Assessment Pain Assessment: No/denies pain     Hand Dominance Left   Extremity/Trunk Assessment Upper Extremity Assessment Upper Extremity Assessment: Generalized weakness   Lower Extremity Assessment Lower Extremity Assessment: Defer to PT evaluation   Cervical / Trunk Assessment Cervical / Trunk Assessment: Kyphotic   Communication Communication Communication: HOH   Cognition Arousal/Alertness: Awake/alert Behavior During Therapy: Flat affect Overall Cognitive Status: Impaired/Different from baseline Area of Impairment: Following commands, Safety/judgement, Awareness, Problem solving                       Following Commands: Follows one step commands with increased time Safety/Judgement: Decreased awareness of safety, Decreased awareness of deficits Awareness: Emergent Problem Solving: Slow  processing, Decreased initiation, Difficulty sequencing, Requires verbal cues, Requires tactile cues       General Comments  Husband present, supportive. SpO2 down to 86% with mobility, hovering around 88% but increases to 92-93% with seated rest breaks and cues for pursed lip breathing. BP stable, denies dizziness    Exercises     Shoulder Instructions      Home Living Family/patient expects to be discharged to:: Private residence Living Arrangements: Spouse/significant other Available Help at Discharge: Family;Available 24 hours/day Type of Home: House Home Access: Stairs to enter [employer] of Steps: 5 Entrance Stairs-Rails: Right Home Layout: Two level;Bed/bath upstairs Alternate Level Stairs-Number of Steps: flight (has stair lift)   Bathroom Shower/Tub: Occupational psychologist: Handicapped height (riser over it)     Home Equipment: Conservation officer, nature (2 wheels);Rollator (4 wheels);BSC/3in1;Shower seat;Grab bars - tub/shower;Grab bars - toilet;Wheelchair - manual;Other (comment) (stair lift)   Additional Comments: No supplementary O2 at baseline      Prior Functioning/Environment Prior Level of Function : Needs assist       Physical Assist : Mobility (physical);ADLs (physical) Mobility (physical): Gait;Stairs ADLs (physical): Bathing;Dressing;IADLs Mobility Comments: Pt mod I with rollator in home, relies on holding onto husband for support outside home. Husband assists on stairs outside. Pt uses stair lift inside with SUP. Pt fatigues quickly. No falls in past year. ADLs Comments: Husband assists with shower transfers for safety but pt able to bathe self once seated on shower chair. PRN assist with LB dressing. Husband completes  IADLs. Pt enjoys going shopping at [employer]        OT Problem List: Decreased strength;Decreased activity tolerance;Impaired balance (sitting and/or standing);Cardiopulmonary status limiting activity;Decreased knowledge of  use of DME or AE      OT Treatment/Interventions: Self-care/ADL training;Therapeutic exercise;Energy conservation;DME and/or AE instruction;Therapeutic activities;Patient/family education;Balance training    OT Goals(Current goals can be found in the care plan section) Acute Rehab OT Goals Patient Stated Goal: want to go home when ready to DC OT Goal Formulation: With patient/family Time For Goal Achievement: 08/31/21 Potential to Achieve Goals: Good  OT Frequency: Min 2X/week    Co-evaluation              AM-PAC OT "6 Clicks" Daily Activity     Outcome Measure Help from another person eating meals?: A Little Help from another person taking care of personal grooming?: A Little Help from another person toileting, which includes using toliet, bedpan, or urinal?: A Lot Help from another person bathing (including washing, rinsing, drying)?: A Lot Help from another person to put on and taking off regular upper body clothing?: A Little Help from another person to put on and taking off regular lower body clothing?: A Lot 6 Click Score: 15   End of Session Equipment Utilized During Treatment: Gait belt;Rolling walker (2 wheels);Oxygen Nurse Communication: Mobility status;Other (comment) (O2)  Activity Tolerance: Patient tolerated treatment well Patient left: in chair;with call bell/phone within reach;with chair alarm set;with family/visitor present  OT Visit Diagnosis: Unsteadiness on feet (R26.81);Other abnormalities of gait and mobility (R26.89);Muscle weakness (generalized) (M62.81)                Time: 9935-7017 OT Time Calculation (min): 36 min Charges:  OT General Charges $OT Visit: 1 Visit OT Evaluation $OT Eval Moderate Complexity: 1 Mod OT Treatments $Self Care/Home Management : 8-22 mins  Malachy Chamber, OTR/L Acute Rehab Services Office: [phone]   Layla Maw 08/17/2021, 12:02 PM

## 2021-08-17 NOTE — Progress Notes (Signed)
OT Cancellation Note ? ?Patient Details ?Name: Katherine [name] ?MRN: [id number] ?DOB: Sep 10, 1928 ? ? ?Cancelled Treatment:    Reason Eval/Treat Not Completed: Other (comment) Husband assisting pt with breakfast currently. Educated on OT purpose and plan for eval with spouse agreeable for OT to come back; will follow up as schedule permits. ? ?Layla Maw ?08/17/2021, 8:50 AM ?

## 2021-08-17 NOTE — Progress Notes (Signed)
Patient transferred to 4E from 2 Heart. Vitals taken and stable. Patient SOB when moving from wheelchair to bed. 97% on 1 L of O2. Tele placed and CCMD notified. CHG completed. Skin assessed and visible bruising on bilateral extremities. Groin and carotid sites level 0. Patient oriented to unit. Call bell within reach. Husband at the bedside.  ? ?Katherine [name]  ?

## 2021-08-17 NOTE — Progress Notes (Signed)
4 Days Post-Op Procedure(s) (LRB): ?Transcatheter Aortic Valve Replacement-Carotid (Right) ?TRANSESOPHAGEAL ECHOCARDIOGRAM (TEE) (N/A) ?PERICARDIAL WINDOW ?Subjective: ?No complaints. Slept well. Sitting up in chair. Worked with OT this morning. ? ?Objective: ?Vital signs in last 24 hours: ?Temp:  [96.5 ?F (35.8 ?C)-97.7 ?F (36.5 ?C)] 96.5 ?F (35.8 ?C) (03/04 0759) ?Pulse Rate:  [71-100] 97 (03/04 1300) ?Cardiac Rhythm: Normal sinus rhythm (03/04 1200) ?Resp:  [16-33] 26 (03/04 1300) ?BP: (91-167)/(44-104) 155/71 (03/04 1300) ?SpO2:  [89 %-99 %] 93 % (03/04 1300) ? ?Hemodynamic parameters for last 24 hours: ?  ? ?Intake/Output from previous day: ?03/03 0701 - 03/04 0700 ?In: 360 [P.O.:360] ?Out: 650 [Urine:650] ?Intake/Output this shift: ?Total I/O ?In: 240 [P.O.:240] ?Out: 200 [Urine:200] ? ?General appearance: alert and cooperative ?Neurologic: intact ?Heart: regular rate and rhythm ?Lungs: clear to auscultation bilaterally ?Extremities: extremities normal, atraumatic, no cyanosis or edema ?Wound: incisions ok ? ?Lab Results: ?Recent Labs  ?  08/16/21 ?0219 08/17/21 ?7858  ?WBC 5.7 5.8  ?HGB 8.0* 8.9*  ?HCT 25.8* 27.7*  ?PLT 83* 112*  ? ?BMET:  ?Recent Labs  ?  08/16/21 ?0219 08/17/21 ?8502  ?NA 138 141  ?K 4.4 4.3  ?CL 114* 113*  ?CO2 20* 24  ?GLUCOSE 93 99  ?BUN 16 13  ?CREATININE 1.18* 1.02*  ?CALCIUM 8.4* 8.8*  ?  ?PT/INR: No results for input(s): LABPROT, INR in the last 72 hours. ?ABG ?   ?Component Value Date/Time  ? PHART 7.46 (H) 08/09/2021 1132  ? HCO3 29.2 (H) 08/09/2021 1132  ? TCO2 25 08/13/2021 1025  ? ACIDBASEDEF 1.0 07/11/2021 0951  ? O2SAT 99 08/09/2021 1132  ? ?CBG (last 3)  ?No results for input(s): GLUCAP in the last 72 hours. ? ?Assessment/Plan: ?S/P Procedure(s) (LRB): ?Transcatheter Aortic Valve Replacement-Carotid (Right) ?TRANSESOPHAGEAL ECHOCARDIOGRAM (TEE) (N/A) ?PERICARDIAL WINDOW ? ?POD 4 ?Hemodynamically stable in sinus rhythm. ? ?Renal function back to normal. ? ?Transfer to 4E and  continue mobilization, nutrition. PT/OT. ? ?Anticipate home Monday with her husband. ? ? LOS: 4 days  ? ? ?Katherine [name] ?08/17/2021 ? ? ?

## 2021-08-17 NOTE — TOC Progression Note (Signed)
Transition of Care (TOC) - Progression Note  ? ? ?Patient Details  ?Name: Katherine [name] ?MRN: [id number] ?Date of Birth: [birth year] ? ?Transition of Care (TOC) CM/SW Contact  ?Bethena Roys, RN ?Phone Number: ?08/17/2021, 12:09 PM ? ?Clinical Narrative:   Case Manager spoke with spouse Katherine [name] at the bedside. PTA patient was from home with spouse and granddaughter. Patient has durable medica equipment cane, rolling walker, rollator, and wheelchair in the home. Patient states patient gets medications from [facility] without any issues. Spouse takes the patient to all appointments. Spouse states that he thinks he will take the patient home without any home health services at this time and he feels that she needs additional services, he will contact her PCP. No further needs identified from Case Manager at this time.  ? ?Expected Discharge Plan: Home/Self Care ?Barriers to Discharge: No Barriers Identified ? ?Expected Discharge Plan and Services ?Expected Discharge Plan: Home/Self Care ?In-house Referral: Clinical Social Work ?Discharge Planning Services: CM Consult ?Post Acute Care Choice: NA ?Living arrangements for the past 2 months: [place] ?                ?DME Arranged: N/A ?DME Agency: NA ?  ?  ?  ?HH Arranged: Refused HH (will call PCP if needs arrise.) ?  ?  ? ?Readmission Risk Interventions ?No flowsheet data found. ? ?

## 2021-08-18 MED ORDER — ONDANSETRON HCL 4 MG/2ML IJ SOLN
4.0000 mg | Freq: Four times a day (QID) | INTRAMUSCULAR | Status: DC | PRN
  Administered 2021-08-18: 4 mg via INTRAVENOUS
  Filled 2021-08-18: qty 2

## 2021-08-18 NOTE — Progress Notes (Addendum)
? ?   ?  [address] ?      [place] [address] ?            [phone]   ? ?  ? ?5 Days Post-Op Procedure(s) (LRB): ?Transcatheter Aortic Valve Replacement-Carotid (Right) ?TRANSESOPHAGEAL ECHOCARDIOGRAM (TEE) (N/A) ?PERICARDIAL WINDOW ?Subjective: ?Resting in bed with her spouse at the bedside.  ?Says she feels OK, denies pain or shortness of breath. ?Worked with OT yesterday. ? ?Objective: ?Vital signs in last 24 hours: ?Temp:  [97.4 ?F (36.3 ?C)-98.1 ?F (36.7 ?C)] 97.4 ?F (36.3 ?C) (03/05 3825) ?Pulse Rate:  [86-114] 105 (03/05 0824) ?Cardiac Rhythm: Sinus tachycardia (03/05 0818) ?Resp:  [17-31] 20 (03/05 0824) ?BP: (138-168)/(62-94) 152/71 (03/05 0824) ?SpO2:  [86 %-100 %] 100 % (03/05 0918) ?Weight:  [47.1 kg-47.4 kg] 47.4 kg (03/05 0500) ? ?  ? ?Intake/Output from previous day: ?03/04 0701 - 03/05 0700 ?In: 600 [P.O.:600] ?Out: 750 [Urine:750] ?Intake/Output this shift: ?Total I/O ?In: 120 [P.O.:120] ?Out: 150 [Urine:150] ? ?General appearance: alert and cooperative ?Neurologic: intact ?Heart: regular rate and rhythm with moderate PVC's and tachycardia this morning after a breathing treatment ?Lungs: coarse rattling breath sounds initially but this cleared after cough. (She just had a nebulizer treatment). ?Extremities: extremities, no edema ?Wound: incisions ok ? ?Lab Results: ?Recent Labs  ?  08/16/21 ?0219 08/17/21 ?0539  ?WBC 5.7 5.8  ?HGB 8.0* 8.9*  ?HCT 25.8* 27.7*  ?PLT 83* 112*  ? ? ?BMET:  ?Recent Labs  ?  08/16/21 ?0219 08/17/21 ?7673  ?NA 138 141  ?K 4.4 4.3  ?CL 114* 113*  ?CO2 20* 24  ?GLUCOSE 93 99  ?BUN 16 13  ?CREATININE 1.18* 1.02*  ?CALCIUM 8.4* 8.8*  ? ?  ?PT/INR: No results for input(s): LABPROT, INR in the last 72 hours. ?ABG ?   ?Component Value Date/Time  ? PHART 7.46 (H) 08/09/2021 1132  ? HCO3 29.2 (H) 08/09/2021 1132  ? TCO2 25 08/13/2021 1025  ? ACIDBASEDEF 1.0 07/11/2021 0951  ? O2SAT 99 08/09/2021 1132  ? ?CBG (last 3)  ?No results for input(s): GLUCAP in the  last 72 hours. ? ?Assessment/Plan: ?S/P Procedure(s) (LRB): ?Transcatheter Aortic Valve Replacement-Carotid (Right) ?TRANSESOPHAGEAL ECHOCARDIOGRAM (TEE) (N/A) ?PERICARDIAL WINDOW ? ?POD 5 ?Progressing slowly with mobility ?Hemodynamically stable in sinus rhythm with moderate PVC's earlier this morning. This seems to have improved at the time of this exam. ? ?Renal function back to normal, UO 750 past 24 hours. ? ?Continue working on ambulation, nutrition, PT/OT. ? ?Anticipate home Monday with her husband. ? ? LOS: 5 days  ? ? ?Antony Odea, PA-C ?08/18/2021 ? ? Chart reviewed, patient examined, agree with above. ?She is stable overall and I think she can go home in am with her husband. She moved better with OT yesterday. She needs to get up moving today. ?

## 2021-08-18 NOTE — Progress Notes (Signed)
Patient feeling nauseous. Zofran had been discontinued. Paged [place], [place]. Told RN to reorder previous Zofran dosage PRN. ? ?[place] N Cheskel Silverio  ?

## 2021-08-19 MED ORDER — ASPIRIN 81 MG PO CHEW: 81.0000 mg | CHEWABLE_TABLET | Freq: Every day | ORAL | Status: DC

## 2021-08-19 NOTE — Progress Notes (Addendum)
Physical Therapy Treatment Patient Details Name: Katherine [name] MRN: [id number] DOB: 10/16/1928 Today's Date: 08/19/2021   History of Present Illness Pt is a 88 y.o. female who presented 08/13/21 for TEE and TAVR. Chest tube placed 2/28. PMH: anemia, arthritis, asthma, COPD, severe aortic stenosis.    PT Comments    Pt received in supine, agreeable to therapy session and with good participation and fair tolerance for transfer and gait training. Pt able to ascend/descending single 7" platform step with up to modA and spouse [name] instructed on safety/guarding positions using RW for transfers, gait and stair negotiation.   Patient Saturations on Room Air at Rest = 88% Patient Saturations on [employer] while Ambulating = 79% Patient Saturations on 2 Liters of oxygen while Ambulating = 91%  Pt hypoxic with exertion on RA, needs 2L O2 [place] to maintain SpO2 >88% with exertion. Pt needs mod cues for safety with transfers and spouse able to remind her of this and to assist for lifting/lowering assist when needed. Discussed benefits of HHPT and spouse indicated he was agreeable to this after discussion. Pt continues to benefit from PT services to progress toward functional mobility goals. Disposition discussed with supervising PT Chrys Racer B and updated below per discussion with pt/family and case mgr/RN notified as well.  Recommendations for follow up therapy are one component of a multi-disciplinary discharge planning process, led by the attending physician.  Recommendations may be updated based on patient status, additional functional criteria and insurance authorization.  Follow Up Recommendations  Home health PT     Assistance Recommended at Discharge Frequent or constant Supervision/Assistance  Patient can return home with the following A lot of help with walking and/or transfers;Help with stairs or ramp for entrance   Equipment Recommendations  Other (comment);Rolling walker (2 wheels)  (supplemental O2; pt has rollator at home, rec RW for safety)    Recommendations for Other Services       Precautions / Restrictions Precautions Precautions: Fall;Other (comment) Precaution Comments: monitor SpO2 Restrictions Weight Bearing Restrictions: No     Mobility  Bed Mobility Overal bed mobility: Needs Assistance Bed Mobility: Supine to Sit, Sit to Supine     Supine to sit: Min assist, HOB elevated Sit to supine: Supervision   General bed mobility comments: pt needing minA for bil hip translation to EOB using bed pad (per spouse they have some at home they can use); no physical assist needed to return to supine but needs verbal cues for body mechanics and to not lay diagonal in bed. Pt using bed rails    Transfers Overall transfer level: Needs assistance Equipment used: Rolling walker (2 wheels) Transfers: Sit to/from Stand Sit to Stand: Min assist, Mod assist    General transfer comment: tendency to pull on RW, consistent cues needed for pushing up from EOB or from knee when standing from armless chair<>RW. Pt needing up to [place] for anterior lean/lift assist from EOB/chair and as little as minA, pt with poor eccentric control to sit when fatigued, tendency to plop. Spouse instructed on use of gait belt, body mechanics and lift assist/hand placement with RW.    Ambulation/Gait Ambulation/Gait assistance: Min assist, Min guard Gait Distance (Feet): 20 Feet (19f, seated break, 140f Assistive device: Rolling walker (2 wheels) Gait Pattern/deviations: Step-to pattern, Decreased step length - right, Decreased step length - left, Decreased stride length, Narrow base of support Gait velocity: reduced     General Gait Details: downward gaze but will briefly correct with cues, RW  lowered for proper height; a couple posterior LOB needing up to minA to correct, mostly with turning; SpO2 initially 88-89% on RA but with stairs/fatigue, desat to 79% on RA; improves to >88% with  2L O2    Stairs Stairs: Yes Stairs assistance: Min assist, Mod assist, +2 safety/equipment Stair Management: Two rails, Step to pattern, Forwards Number of Stairs: 2 General stair comments: pt ascended/descended platform step in front of room/doorway using BUE support of RW to simulate B handrails at home, cues for step sequencing/safety and pt spouse using gait belt, instructed on guarding positions; Pt desat on RA, see gait comments above. Posterior LOB x2 but able to correct with modA and UE support, needed 2 attempts to ascend second step.      Balance Overall balance assessment: Needs assistance Sitting-balance support: Feet supported, No upper extremity supported Sitting balance-Leahy Scale: Fair     Standing balance support: Bilateral upper extremity supported, Reliant on assistive device for balance, During functional activity Standing balance-Leahy Scale: Poor Standing balance comment: Reliant on BUE support with mobility        Cognition Arousal/Alertness: Awake/alert Behavior During Therapy: Flat affect Overall Cognitive Status: Impaired/Different from baseline Area of Impairment: Following commands, Safety/judgement, Awareness, Problem solving       Following Commands: Follows one step commands with increased time, Follows one step commands inconsistently Safety/Judgement: Decreased awareness of safety, Decreased awareness of deficits Awareness: Emergent Problem Solving: Slow processing, Difficulty sequencing, Requires verbal cues, Requires tactile cues General Comments: Pt with poor following of safety instructions, such as hand placement on RW, does better after repetition. Coached spouse [name] through guarding positions using gait belt. x5 total STS from EOB and x2 STS from chair without arms <>RW        Exercises Other Exercises Other Exercises: IS x 10 reps (100-200 mL) encouraged hourly use Other Exercises: seated BLE AROM: LAQ x10 reps ea Other  Exercises: STS x 3 reciprocal reps    General Comments General comments (skin integrity, edema, etc.): HR 106 bpm-127 bpm with exertion; SpO2 93% on RA resting initially and 88% on RA post-exertion (after O2 removed); SpO2 desat to 79% with exertion/stair trial, RN/CM notified. No dizziness reported      Pertinent Vitals/Pain Pain Assessment Pain Assessment: No/denies pain Pain Intervention(s): Monitored during session, Repositioned     PT Goals (current goals can now be found in the care plan section) Acute Rehab PT Goals Patient Stated Goal: to go home PT Goal Formulation: With patient/family Time For Goal Achievement: 08/30/21 Progress towards PT goals: Progressing toward goals    Frequency    Min 3X/week      PT Plan Current plan remains appropriate       AM-PAC PT "6 Clicks" Mobility   Outcome Measure  Help needed turning from your back to your side while in a flat bed without using bedrails?: A Little Help needed moving from lying on your back to sitting on the side of a flat bed without using bedrails?: A Little Help needed moving to and from a bed to a chair (including a wheelchair)?: A Lot (mod cues) Help needed standing up from a chair using your arms (e.g., wheelchair or bedside chair)?: A Lot (mod cues) Help needed to walk in hospital room?: A Lot (mod cues) Help needed climbing 3-5 steps with a railing? : A Lot 6 Click Score: 14    End of Session Equipment Utilized During Treatment: Gait belt;Oxygen Activity Tolerance: Patient tolerated treatment well;Patient limited by  fatigue Patient left: in bed;with call bell/phone within reach;with bed alarm set;with family/visitor present;Other (comment) (spouse [name] present; bed in chair position) Nurse Communication: Mobility status;Other (comment) (needs supplemental O2 with exertion, CM/RN notified) PT Visit Diagnosis: Unsteadiness on feet (R26.81);Other abnormalities of gait and mobility (R26.89);Muscle  weakness (generalized) (M62.81);Difficulty in walking, not elsewhere classified (R26.2)     Time: 2956-2130 PT Time Calculation (min) (ACUTE ONLY): 37 min  Charges:  $Gait Training: 8-22 mins $Therapeutic Exercise: 8-22 mins                     Eilish Mcdaniel P., PTA Acute Rehabilitation Services Pager: [phone] Office: [place] 08/19/2021, 12:07 PM

## 2021-08-19 NOTE — Progress Notes (Signed)
CARDIAC REHAB PHASE I  ? ?Pt just ambulated with PT. Feeling stronger. Pt and spouse educated on site care and restrictions. Emphasized importance of safety. Pt grateful for the care she has received but very excited to be going home. Declines CRP II. ? ?[id number] ?Rufina Falco, RN BSN ?08/19/2021 ?12:02 PM ? ?

## 2021-08-19 NOTE — Progress Notes (Signed)
Patient discharging home with husband. Iv removed without complications. Tele removed and CCMD notified. Discharge instructions completed and medication administration discussed. All questions answered. Patient waiting for O2 to be delivered.  ?Katherine [name]  ?

## 2021-08-19 NOTE — Progress Notes (Signed)
Occupational Therapy Treatment ?Patient Details ?Name: Katherine [name] ?MRN: [id number] ?DOB: 09-12-1928 ?Today's Date: 08/19/2021 ? ? ?History of present illness Pt is a 88 y.o. female who presented 08/13/21 for TEE and TAVR. Chest tube placed 2/28. PMH: anemia, arthritis, asthma, COPD, severe aortic stenosis. ?  ?OT comments ? Session focused on education re: energy conservation, continued mobility at home to improve endurance, ADL routine, monitoring SpO2 at home (establishing if goal is 88%+ or 90%+ with pt's COPD), and safety techniques. Pt's spouse with good understanding of pt's physical needs for mobility/ADLs, reports having clear paths for pt throughout home to decrease fall risk, etc. Reinforced BSC use at night initially and continued 24/7 hands on assist for ADLs/mobility initially to maximize safety. Pt still with some decreased insight into deficits as she reports no issues with ADL completion currently and declines OOB ADL participation at this time. Continue to rec HHOT at [place]. ? ?SpO2 hovering 88-90% at rest on RA.  ? ?Recommendations for follow up therapy are one component of a multi-disciplinary discharge planning process, led by the attending physician.  Recommendations may be updated based on patient status, additional functional criteria and insurance authorization. ?   ?Follow Up Recommendations ? Home health OT  ?  ?Assistance Recommended at Discharge Frequent or constant Supervision/Assistance  ?Patient can return home with the following ? A little help with walking and/or transfers;A little help with bathing/dressing/bathroom;Help with stairs or ramp for entrance;Assist for transportation ?  ?Equipment Recommendations ? None recommended by OT (has all needed DME)  ?  ?Recommendations for Other Services   ? ?  ?Precautions / Restrictions Precautions ?Precautions: Fall;Other (comment) ?Precaution Comments: monitor SpO2 ?Restrictions ?Weight Bearing Restrictions: No  ? ? ?  ? ?Mobility Bed  Mobility ?  ?  ?  ?  ?  ?  ?  ?General bed mobility comments: declined ?  ? ?Transfers ?  ?  ?  ?  ?  ?  ?  ?  ?  ?  ?  ?  ?Balance   ?  ?  ?  ?  ?  ?  ?  ?  ?  ?  ?  ?  ?  ?  ?  ?  ?  ?  ?   ? ?ADL either performed or assessed with clinical judgement  ? ?ADL Overall ADL's : Needs assistance/impaired ?  ?  ?  ?  ?  ?  ?  ?  ?  ?  ?  ?  ?  ?  ?  ?  ?  ?  ?  ?General ADL Comments: Pt/spouse declined OOB activities d/t fatigue with stairs, etc, citing "done it all" today. Refocused conversation to safety education (use of gait belt with husband planning to be right beside pt with all activity for first few days at home), ADL routine (use of BSC at night), energy conservation due to COPD baseline (emphasis on pursed lip breathing), monitor O2 with pulse ox as pt's spouse understands O2 needs as prn at home, as well as establishing with pulmonary MD if goal is 88% and above or 90% and above for SpO2.  Spouse reports familiar with HH therapies, have moved all throw rugs, clear paths in home, etc. Encouraged pt mobility at home as pt reports not planning to do much once at home. ?  ? ?Extremity/Trunk Assessment Upper Extremity Assessment ?Upper Extremity Assessment: Generalized weakness ?  ?Lower Extremity Assessment ?Lower Extremity Assessment: Defer to PT evaluation ?  ?  ?  ? ?  Vision   ?Vision Assessment?: No apparent visual deficits ?  ?Perception   ?  ?Praxis   ?  ? ?Cognition Arousal/Alertness: Awake/alert ?Behavior During Therapy: Flat affect ?Overall Cognitive Status: Impaired/Different from baseline ?Area of Impairment: Following commands, Safety/judgement, Awareness, Problem solving ?  ?  ?  ?  ?  ?  ?  ?  ?  ?  ?  ?Following Commands: Follows one step commands with increased time ?Safety/Judgement: Decreased awareness of safety, Decreased awareness of deficits ?Awareness: Emergent ?Problem Solving: Slow processing, Difficulty sequencing, Requires verbal cues, Requires tactile cues ?General Comments: decreased  insight into deficits, reports "i can do all of that" when asked about endurance standing at sink, bathroom mobility, etc ?  ?  ?   ?Exercises   ? ?  ?Shoulder Instructions   ? ? ?  ?General Comments Spo2 hovering 88-90% on RA talking with OT and spouse  ? ? ?Pertinent Vitals/ Pain       Pain Assessment ?Pain Assessment: No/denies pain ?Pain Intervention(s): Monitored during session ? ?Home Living   ?  ?  ?  ?  ?  ?  ?  ?  ?  ?  ?  ?  ?  ?  ?  ?  ?  ?  ? ?  ?Prior Functioning/Environment    ?  ?  ?  ?   ? ?Frequency ? Min 2X/week  ? ? ? ? ?  ?Progress Toward Goals ? ?OT Goals(current goals can now be found in the care plan section) ? Progress towards OT goals: OT to reassess next treatment ? ?Acute Rehab OT Goals ?Patient Stated Goal: go home today if possible ?OT Goal Formulation: With patient/family ?Time For Goal Achievement: 08/31/21 ?Potential to Achieve Goals: Good ?ADL Goals ?Pt Will Transfer to Toilet: with set-up;ambulating ?Pt Will Perform Toileting - Clothing Manipulation and hygiene: with supervision;sit to/from stand;sitting/lateral leans ?Pt/caregiver will Perform Home Exercise Program: Increased strength;Both right and left upper extremity;With theraband;With Supervision;With written HEP provided ?Additional ADL Goal #1: Pt to increase standing activity tolerance > 7 min during functional tasks with implementation of pursed lip breathing prn  ?Plan Discharge plan remains appropriate   ? ?Co-evaluation ? ? ?   ?  ?  ?  ?  ? ?  ?AM-PAC OT "6 Clicks" Daily Activity     ?Outcome Measure ? ? Help from another person eating meals?: A Little ?Help from another person taking care of personal grooming?: A Little ?Help from another person toileting, which includes using toliet, bedpan, or urinal?: A Lot ?Help from another person bathing (including washing, rinsing, drying)?: A Lot ?Help from another person to put on and taking off regular upper body clothing?: A Little ?Help from another person to put on and  taking off regular lower body clothing?: A Lot ?6 Click Score: 15 ? ?  ?End of Session   ? ?OT Visit Diagnosis: Unsteadiness on feet (R26.81);Other abnormalities of gait and mobility (R26.89);Muscle weakness (generalized) (M62.81) ?  ?Activity Tolerance Patient tolerated treatment well ?  ?Patient Left in bed;with call bell/phone within reach;with family/visitor present ?  ?Nurse Communication Mobility status ?  ? ?   ? ?Time: 9233-0076 ?OT Time Calculation (min): 11 min ? ?Charges: OT General Charges ?$OT Visit: 1 Visit ?OT Treatments ?$Self Care/Home Management : 8-22 mins ? ?Malachy Chamber, OTR/L ?Acute Rehab Services ?Office: [phone]  ? ?Layla Maw ?08/19/2021, 1:00 PM ?

## 2021-08-19 NOTE — Progress Notes (Signed)
SATURATION QUALIFICATIONS: (This note is used to comply with regulatory documentation for home oxygen) ? ?Patient Saturations on Room Air at Rest = 88% ? ?Patient Saturations on Room Air while Ambulating = 79% ? ?Patient Saturations on 2 Liters of oxygen while Ambulating = 91% ? ?Please briefly explain why patient needs home oxygen: Pt hypoxic with exertion on RA, needs 2L O2 [place] to maintain SpO2 >88% with exertion. ?

## 2021-08-19 NOTE — Care Management Important Message (Signed)
Important Message ? ?Patient Details  ?Name: Katherine [name] ?MRN: [id number] ?Date of Birth: [birth year] ? ? ?Medicare Important Message Given:  Yes ? ? ? ? ?Shelda Altes ?08/19/2021, 9:20 AM ?

## 2021-08-19 NOTE — TOC Transition Note (Signed)
Transition of Care (TOC) - CM/SW Discharge Note ?Katherine [name] Therapist, sports, BSN ?Transitions of Care ?Unit 4E- RN Case Manager ?See Treatment Team for direct phone #  ? ? ?Patient Details  ?Name: Katherine [name] ?MRN: [id number] ?Date of Birth: 09-22-1928 ? ?Transition of Care (TOC) CM/SW Contact:  ?Katherine [name], Katherine Rabon, RN ?Phone Number: ?08/19/2021, 3:21 PM ? ? ?Clinical Narrative:    ?Pt for possible transition home today, Per PT note pt will need home 02 arranged. Will need order for home 02 as well as HHPT.  ?CM spoke with pt and spouse at the bedside to discuss potential needs. Per conversation pt/spouse report that pt has had [facility] set up in the past by her PCP- [name]. List provided Per CMS guidelines from medicare.gov website with star ratings (copy placed in shadow chart) for choice. After review they believe it was [facility] that was the agency used. They voice concern about copay cost, however are agreeable to referral and will see what copay cost are when [name] agency calls them. Also discussed Home 02 needs- offered choice for DME company- they don't really have preference and defer to CM to secure- spouse states they want smallest available tank/carrier available and prefer not to have concentrator if that is an option- advised him that they can speak with the DME company regarding this when they call.  ? ?1500- D/C order in- noted no HH or DME orders placed- paged [facility]- verbal orders received for HHPT and home 02 needs.  ?1515-Call made to [facility] with [employer] for home 02 needs- they will contact [employer] for auth and process for home 02 needs- once auth received will deliver portable equipment to room for transport home.  ?1530- Call made to [facility] w/ [place] for HHPT referral - referral accepted- per [place] pt's plan is showing zero dollar copay for HHPT visits.  ? ? ? ?Final next level of care: [name] ?Barriers to Discharge: No Barriers Identified ? ? ?Patient Goals and CMS  Choice ?Patient states their goals for this hospitalization and ongoing recovery are:: to return home with family support. ?CMS Medicare.gov Compare Post Acute Care list provided to:: Patient ?Choice offered to / list presented to : Spouse ? ?Discharge Placement ?  ?           ? Home w/ HH ?  ?  ?  ? ?Discharge Plan and Services ?In-house Referral: Clinical Social Work ?Discharge Planning Services: CM Consult ?Post Acute Care Choice: Durable Medical Equipment, Home Health          ?DME Arranged: Oxygen ?DME Agency: [employer] ?Date DME Agency Contacted: 08/19/21 ?Time DME Agency Contacted: [phone] ?Representative spoke with at DME Agency: [place] ?HH Arranged: PT ?[place] Agency: [place] ?Date HH Agency Contacted: 08/19/21 ?Time [place]: [phone] ?Representative spoke with at [place]: [place] ? ?Social Determinants of Health (SDOH) Interventions ?  ? ? ?Readmission Risk Interventions ?Readmission Risk Prevention Plan 08/19/2021  ?Post Dischage Appt Complete  ?Medication Screening Complete  ?Transportation Screening Complete  ?Some recent data might be hidden  ? ? ? ? ? ?

## 2021-08-20 ENCOUNTER — Telehealth: Payer: Self-pay | Admitting: Physician Assistant

## 2021-08-20 NOTE — Telephone Encounter (Signed)
?  [facility]   ?MULTIDISCIPLINARY HEART VALVE TEAM ? ? ?Patient contacted regarding discharge from [facility] on 3/6 ? ?Patient understands to follow up with a structural heart APP on 3/10 at [facility].  ?Patient understands discharge instructions? yes ?Patient understands medications and regimen? yes ?Patient understands to bring all medications to this visit? yes ? ?Angelena Form PA-C  [facility]  ? ? ? ?

## 2021-08-21 ENCOUNTER — Telehealth: Payer: Self-pay

## 2021-08-21 ENCOUNTER — Ambulatory Visit: Payer: Medicare PPO

## 2021-08-21 NOTE — Telephone Encounter (Signed)
Transition Care Management Follow-up Telephone Call ?Date of discharge and from where: [name] March 6  ?How have you been since you were released from the hospital? Doing ok, PT helping ?Any questions or concerns? No ? ?Items Reviewed: ?Did the pt receive and understand the discharge instructions provided? Yes  ?Medications obtained and verified? Yes  ?Other? Yes  ?Any new allergies since your discharge? No  ?Dietary orders reviewed? Yes ?Do you have support at home? Yes  ? ?Home Care and Equipment/Supplies: ?Were home health services ordered? no ?If so, what is the name of the agency? N/a  ?Has the agency set up a time to come to the patient/a's home? not applicable ?Were any new equipment or medical supplies ordered?  Yes: oxygen ?What is the name of the medical supply agency? N/a-hospital  ?Were you able to get the supplies/equipment? yes ?Do you have any questions related to the use of the equipment or supplies? No ? ?Functional Questionnaire: (I = Independent and D = Dependent) ?ADLs: D ? ?Bathing/Dressing- D ? ?Meal Prep- D ? ?Eating- D ? ?Maintaining continence- D ? ?Transferring/Ambulation- D ? ?Managing Meds- D ? ?Follow up appointments reviewed: ? ?PCP Hospital f/u appt confirmed? Yes  Scheduled to see Katherine [name]  on 3/13 @ 1215. ?[facility] f/u appt confirmed? Yes  Scheduled to see Cardio on 3/10 @ 1. ?Are transportation arrangements needed? No  ?If their condition worsens, is the pt aware to call PCP or go to the Emergency Dept.? Yes ?Was the patient provided with contact information for the PCP's office or ED? Yes ?Was to pt encouraged to call back with questions or concerns? Yes  ?

## 2021-08-22 ENCOUNTER — Other Ambulatory Visit: Payer: Self-pay

## 2021-08-23 ENCOUNTER — Other Ambulatory Visit: Payer: Self-pay

## 2021-08-23 ENCOUNTER — Ambulatory Visit: Payer: Medicare PPO | Admitting: Physician Assistant

## 2021-08-23 VITALS — BP 150/72 | HR 77 | Ht 61.0 in | Wt 99.0 lb

## 2021-08-23 DIAGNOSIS — I3139 Other pericardial effusion (noninflammatory): Secondary | ICD-10-CM | POA: Diagnosis not present

## 2021-08-23 DIAGNOSIS — Z952 Presence of prosthetic heart valve: Secondary | ICD-10-CM | POA: Diagnosis not present

## 2021-08-23 DIAGNOSIS — N289 Disorder of kidney and ureter, unspecified: Secondary | ICD-10-CM

## 2021-08-23 DIAGNOSIS — J449 Chronic obstructive pulmonary disease, unspecified: Secondary | ICD-10-CM

## 2021-08-23 DIAGNOSIS — I10 Essential (primary) hypertension: Secondary | ICD-10-CM | POA: Diagnosis not present

## 2021-08-23 DIAGNOSIS — E875 Hyperkalemia: Secondary | ICD-10-CM

## 2021-08-23 NOTE — Patient Instructions (Addendum)
Medication Instructions:  ?Your physician recommends that you continue on your current medications as directed. Please refer to the Current Medication list given to you today. ? ?*If you need a refill on your cardiac medications before your next appointment, please call your pharmacy* ? ?Lab Work: ?Your physician recommends that you have lab work today- BMET and CBC ? ?If you have labs (blood work) drawn today and your tests are completely normal, you will receive your results only by: ?MyChart Message (if you have MyChart) OR ?A paper copy in the mail ?If you have any lab test that is abnormal or we need to change your treatment, we will call you to review the results. ? ?Testing/Procedures: ?None ordered today. ? ?Follow-Up: ?At [facility], you and your health needs are our priority.  As part of our continuing mission to provide you with exceptional heart care, we have created designated Provider Care Teams.  These Care Teams include your primary Cardiologist (physician) and Advanced Practice Providers (APPs -  Physician Assistants and Nurse Practitioners) who all work together to provide you with the care you need, when you need it. ? ?We recommend signing up for the patient portal called "MyChart".  Sign up information is provided on this After Visit Summary.  MyChart is used to connect with patients for Virtual Visits (Telemedicine).  Patients are able to view lab/test results, encounter notes, upcoming appointments, etc.  Non-urgent messages can be sent to your provider as well.   ?To learn more about what you can do with MyChart, go to [email or web address].   ? ?Your next appointment:   ?As scheduled ? ?

## 2021-08-23 NOTE — Progress Notes (Signed)
HEART AND [place]                                     Cardiology Office Note:    Date:  08/24/2021   ID:  Katherine [name], DOB 04/17/1929, MRN [id number]  PCP:  Elby Showers, MD  [facility] Cardiologist:  Lauree Chandler, MD  [facility] Electrophysiologist:  None   Referring MD: Elby Showers, MD   [facility] s/p TAVR  History of Present Illness:    Katherine [name] is a 88 y.o. female with a hx of prior etoh/tobacco abuse, anemia, arthritis, COPD, HLD, IBS and severe aortic stenosis s/p TAVR (08/13/21) who presents to clinic for follow up.    She has been followed by Dr. Renold Genta with moderate aortic stenosis.  A 2D echocardiogram in January 2019 showed a trileaflet aortic valve with moderate thickening and calcification with a mean gradient of 27 mmHg and a peak gradient of 43 mmHg.  Valve area by VTI was 1.44 cm.  She has had a several year history of exertional fatigue and shortness of breath which was felt to be due to COPD.  She has been treated with bronchodilators but they do not seem to help that much.  She was being evaluated for hand surgery and the heart murmur was noted and she required a cardiac evaluation.  She had a repeat echocardiogram on 05/21/2021.  This showed a severely calcified aortic valve with a mean gradient that increased to 33.7 mmHg with a peak gradient of 57 mmHg.  Aortic valve area was 0.62 cm by VTI.  Dimensionless index was 0.2.  She underwent cardiac catheterization on 07/11/2021 showing mild nonobstructive coronary disease.  PA pressures were normal.   She was evaluated by the multidisciplinary valve team and underwent successful TAVR with a 23 mm Edwards Sapien 3 Ultra Resilia THV via the right carotid approach on 08/13/21. After valve deployment, the patient remained hypotensive. TEE revealed a large pericardial effusion tamponade. Dr. Cyndia Bent placed a pericardial window though the subxiphoid space. Chest  tube was placed and removed the following day. Post operative echo showed EF 60%, normally functioning TAVR with a mean gradient of 10 mmHg and no PVL and small pericardial effusion. Creat bumped from normal to 1.65, likely due to periop hypotension related to severe AS, cardiac perforation with hemopericardium and tamponade, and contrast. This normalized after IVFs. She was also treated for hyperkalemia.   Today the patient presents to clinic for follow up. Here with husband. Slowly getting better. Still has significant dyspnea. Does not want to be on 02. No CP. No LE edema, orthopnea or PND. No dizziness or syncope. No blood in stool or urine. No palpitations.   Past Medical History:  Diagnosis Date   Allergy    Anemia    Arthritis    Asthma    patient denies   B12 deficiency    COPD (chronic obstructive pulmonary disease) ([place])    Depression    not recent   Diverticulosis    Esophageal stricture    Hepatic cyst 01/30/2010   Hiatal hernia 1985   patient unaware   Hx of adenomatous colonic polyps 2006   Hyperlipidemia    IBS (irritable bowel syndrome)    PONV (postoperative nausea and vomiting)    no nausea or vomitting after surgery 07/2014   Positive PPD  PUD (peptic ulcer disease)    S/P TAVR (transcatheter aortic valve replacement) 08/13/2021   s/p TAVR with a 44m Edwards S3UR via the right carotid approach by Dr. MAngelena Form& Dr. BCyndia Bent  Severe aortic stenosis     Past Surgical History:  Procedure Laterality Date   ABDOMINAL HYSTERECTOMY     abnormal bleeding   APPENDECTOMY     BARTHOLIN GLAND CYST EXCISION     x2   BREAST BIOPSY Bilateral    Milk glnd   CATARACT EXTRACTION W/ INTRAOCULAR LENS  IMPLANT, BILATERAL Bilateral    COLONOSCOPY     COLONOSCOPY W/ POLYPECTOMY     HARDWARE REMOVAL N/A 12/13/2015   Procedure: Removal of HARDWARE  Lumbar Five-Sacral One ;  Surgeon: GKary Kos MD;  [facility];  Service: Neurosurgery;  Laterality: N/A;   IR KYPHO  LUMBAR INC FX REDUCE BONE BX UNI/BIL CANNULATION INC/IMAGING  07/13/2019   PERICARDIAL WINDOW  08/13/2021   Procedure: PERICARDIAL WINDOW;  Surgeon: MBurnell Blanks MD;  Location: [facility]  Service: Open Heart Surgery;;   RIGHT/LEFT HEART CATH AND CORONARY ANGIOGRAPHY N/A 07/11/2021   Procedure: RIGHT/LEFT HEART CATH AND CORONARY ANGIOGRAPHY;  Surgeon: MBurnell Blanks MD;  [facility];  Service: Cardiovascular;  Laterality: N/A;   TEE WITHOUT CARDIOVERSION N/A 08/13/2021   Procedure: TRANSESOPHAGEAL ECHOCARDIOGRAM (TEE);  Surgeon: MBurnell Blanks MD;  Location: [facility]  Service: Open Heart Surgery;  Laterality: N/A;   TRANSCATHETER AORTIC VALVE REPLACEMENT, CAROTID Right 08/13/2021   Procedure: Transcatheter Aortic Valve Replacement-Carotid;  Surgeon: MBurnell Blanks MD;  Location: [facility]  Service: Open Heart Surgery;  Laterality: Right;   UPPER GI ENDOSCOPY     VAGOTOMY AND PYLOROPLASTY     in [place], [place]    Current Medications: Current Meds  Medication Sig   albuterol (VENTOLIN HFA) 108 (90 Base) MCG/ACT inhaler INHALE 2 PUFFS INTO THE LUNGS 4 (FOUR) TIMES DAILY AS NEEDED FOR WHEEZING OR SHORTNESS OF BREATH.   ALPRAZolam (XANAX) 0.25 MG tablet TAKE ONE TAB UP TO TWICE DAILY AS NEEDED FOR ANXIETY AND SLEEP (Patient taking differently: Take 0.25 mg by mouth at bedtime.)   arformoterol (BROVANA) 15 MCG/2ML NEBU Take 2 mLs (15 mcg total) by nebulization 2 (two) times daily.   aspirin 81 MG chewable tablet Chew 1 tablet (81 mg total) by mouth daily.   Cyanocobalamin (VITAMIN B-12 PO) Take 1 tablet by mouth daily.   escitalopram (LEXAPRO) 5 MG tablet TAKE 1 TABLET BY MOUTH EVERY DAY   fluticasone (FLONASE) 50 MCG/ACT nasal spray Place 1 spray into both nostrils daily. (Patient taking differently: Place 1 spray into both nostrils daily as needed for allergies.)   ipratropium (ATROVENT) 0.02 % nebulizer solution TAKE 2.5 MLS (0.5 MG TOTAL) BY  NEBULIZATION 4 (FOUR) TIMES DAILY. (Patient taking differently: Take 0.5 mg by nebulization every 6 (six) hours as needed for wheezing or shortness of breath.)   Melatonin 10 MG CAPS Take 10 mg by mouth at bedtime.   Multiple Vitamins-Minerals (MULTIVITAMIN WITH MINERALS) tablet Take 1 tablet by mouth daily.   pantoprazole (PROTONIX) 40 MG tablet Take 1 tablet (40 mg total) by mouth daily.   Polysaccharide Iron Complex (POLYSACCHARIDE IRON PO) Take 1 capsule by mouth daily.   sucralfate (CARAFATE) 1 GM/10ML suspension Take 10 mLs (1 g total) by mouth 4 (four) times daily -  with meals and at bedtime.   traMADol (ULTRAM) 50 MG tablet Take 50 mg by mouth  every 6 (six) hours as needed for moderate pain.    traZODone (DESYREL) 50 MG tablet TAKE 3 TABLETS BY MOUTH AT BEDTIME (Patient taking differently: Take 100 mg by mouth at bedtime.)     Allergies:   Aspirin   Social History   Socioeconomic History   Marital status: Married    Spouse name: Not on file   Number of children: 2   Years of education: Not on file   Highest education level: Not on file  Occupational History    Employer: RETIRED   Occupation: Retired-office work  Tobacco Use   Smoking status: Former    Years: 60.00    Types: Cigarettes    Quit date: 02/02/2010    Years since quitting: 11.5   Smokeless tobacco: Never  Vaping Use   Vaping Use: Never used  Substance and Sexual Activity   Alcohol use: No    Comment: quit in 1983   Drug use: No   Sexual activity: Not on file  Other Topics Concern   Not on file  Social History Narrative   Daily caffeine    Social Determinants of Health   Financial Resource Strain: Not on file  Food Insecurity: Not on file  Transportation Needs: Not on file  Physical Activity: Not on file  Stress: Not on file  Social Connections: Not on file     Family History: The patient's family history includes Irritable bowel syndrome in her father; Ovarian cancer in her sister; Stroke in her  father; Ulcers in her father. There is no history of Colon cancer.  ROS:   Please see the history of present illness.    All other systems reviewed and are negative.  EKGs/Labs/Other Studies Reviewed:    The following studies were reviewed today:  TAVR OPERATIVE NOTE     Date of Procedure:                08/13/2021   Preoperative Diagnosis:      Severe Aortic Stenosis    Postoperative Diagnosis:    Same    Procedure:        Transcatheter Aortic Valve Replacement -Right Common Carotid Artery Approach             Edwards Sapien 3 Ultra THV (size 23 mm, model # 9755RSL, serial # [id number])   Sub-xyphoid pericardial window                  Co-Surgeons:                        Gaye Pollack, MD and Lauree Chandler, MD     Anesthesiologist:                  Renaldo Reel, MD   Echocardiographer:              Osborne Oman, MD   Pre-operative Echo Findings: Severe aortic stenosis Normal left ventricular systolic function Small pericardial effusion   Post-operative Echo Findings: No paravalvular leak Normal left ventricular systolic function Large pericardial effusion   _____________________   Echo 08/15/31 IMPRESSIONS  1. The aortic valve has replaced with a 23 mm Edwards Sapien Valve.  Aortic valve regurgitation is not visualized. Effective orifice area, by  VTI measures 1.89 cm. Aortic valve mean gradient measures 6.0 mmHg. Peak  gradient 10 mm Hg. Acceleration time  60 ms. DVI 0.7   2. Left ventricular ejection fraction, by estimation, is 60 to 65%.  The  left ventricle has normal function. The left ventricle has no regional  wall motion abnormalities. Left ventricular diastolic parameters are  consistent with Grade I diastolic  dysfunction (impaired relaxation). Elevated left atrial pressure.   3. Right ventricular systolic function is normal. The right ventricular  size is normal. There is normal pulmonary artery systolic pressure.   4. A small pericardial  effusion is present. The pericardial effusion is  circumferential. There is excessive respiratory variation in septal  movement. no other signs of increased pericardial pressure.   5. The mitral valve is degenerative. No evidence of mitral valve  regurgitation. Moderate mild to mitral stenosis. The mean mitral valve  gradient is 4.0 mmHg with average heart rate of 89 bpm. MVA by continuity  equation 1.36 cm2.   6. The inferior vena cava is dilated in size with >50% respiratory  variability, suggesting right atrial pressure of 8 mmHg.   Comparison(s): Stable prosthetic valve parameters. Effusion has improved  from prior.     EKG:  EKG is ordered today.  The ekg ordered today demonstrates sinus HR 77  Recent Labs: 04/02/2021: TSH 2.28 08/09/2021: ALT 16; B Natriuretic Peptide 254.8 08/15/2021: Magnesium 2.2 08/23/2021: BUN 9; Creatinine, Ser 0.90; Hemoglobin 9.3; Platelets 233; Potassium 3.5; Sodium 145  Recent Lipid Panel    Component Value Date/Time   CHOL 199 04/02/2021 1116   TRIG 55 08/14/2021 0555   HDL 69 04/02/2021 1116   CHOLHDL 2.9 04/02/2021 1116   VLDL 17 04/08/2016 1059   LDLCALC 110 (H) 04/02/2021 1116     Risk Assessment/Calculations:       Physical Exam:    VS:  BP (!) 150/72    Pulse 77    Ht '5\' 1"'$  (1.549 m)    Wt 99 lb (44.9 kg)    SpO2 99%    BMI 18.71 kg/m     Wt Readings from Last 3 Encounters:  08/23/21 99 lb (44.9 kg)  08/19/21 101 lb 13.6 oz (46.2 kg)  08/09/21 96 lb (43.5 kg)     GEN:  Well nourished, well developed in no acute distress HEENT: Normal NECK: No JVD LYMPHATICS: No lymphadenopathy CARDIAC: RRR, no murmurs, rubs, gallops RESPIRATORY:  Clear to auscultation without rales, wheezing or rhonchi  ABDOMEN: Soft, non-tender, non-distended MUSCULOSKELETAL:  No edema; No deformity  SKIN: Warm and dry NEUROLOGIC:  Alert and oriented x 3 PSYCHIATRIC:  Normal affect   ASSESSMENT:    1. S/P TAVR (transcatheter aortic valve replacement)    2. Pericardial effusion   3. Renal insufficiency   4. Hyperkalemia   5. Hypertension, unspecified type   6. Chronic obstructive pulmonary disease, unspecified COPD type ([place])    PLAN:    In order of problems listed above:  Severe AS s/p TAVR: doing okay today. Getting stronger. Groin/carotid/subxiphoid sites are stable. Chest tube suture removed.  ECG with sinus and no high grade heart block. Continue Asprin alone. I will see her back for 1 month follow up with echo   Pericardial effusion s/p pericardial window: after valve deployment, the patient remained hypotensive. TEE revealed a large pericardial effusion tamponade. Dr. Cyndia Bent placed a pericardial window though the subxiphoid space. Chest tube was placed and removed the following day. POD1 echo showed a small circumferential pericardial effusion. Follow echo.    AKI: creat of 1.02 at discharge. Check BMET today   Hyperkalemia: check Bmet   HTN: BP elevated today. No changes made.    COPD: has been on home  02. She has normal 02 sats at rest. We walked a very short distance together and her sats went down to 79% and she was very short of breath. She will need to remain on 02 with activity.   Medication Adjustments/Labs and Tests Ordered: Current medicines are reviewed at length with the patient today.  Concerns regarding medicines are outlined above.  Orders Placed This Encounter  Procedures   Basic metabolic panel   CBC with Differential/Platelet   EKG 12-Lead   No orders of the defined types were placed in this encounter.   Patient Instructions  Medication Instructions:  Your physician recommends that you continue on your current medications as directed. Please refer to the Current Medication list given to you today.  *If you need a refill on your cardiac medications before your next appointment, please call your pharmacy*  Lab Work: Your physician recommends that you have lab work today- BMET and CBC  If you have  labs (blood work) drawn today and your tests are completely normal, you will receive your results only by: MyChart Message (if you have MyChart) OR A paper copy in the mail If you have any lab test that is abnormal or we need to change your treatment, we will call you to review the results.  Testing/Procedures: None ordered today.  Follow-Up: At [facility], you and your health needs are our priority.  As part of our continuing mission to provide you with exceptional heart care, we have created designated Provider Care Teams.  These Care Teams include your primary Cardiologist (physician) and Advanced Practice Providers (APPs -  Physician Assistants and Nurse Practitioners) who all work together to provide you with the care you need, when you need it.  We recommend signing up for the patient portal called "MyChart".  Sign up information is provided on this After Visit Summary.  MyChart is used to connect with patients for Virtual Visits (Telemedicine).  Patients are able to view lab/test results, encounter notes, upcoming appointments, etc.  Non-urgent messages can be sent to your provider as well.   To learn more about what you can do with MyChart, go to [email or web address].    Your next appointment:   As scheduled    Signed, Angelena Form, PA-C  08/24/2021 8:11 PM    [facility]

## 2021-08-24 LAB — CBC WITH DIFFERENTIAL/PLATELET
Basophils Absolute: 0 10*3/uL (ref 0.0–0.2)
Basos: 1 %
EOS (ABSOLUTE): 0.2 10*3/uL (ref 0.0–0.4)
Eos: 4 %
Hematocrit: 28.5 % — ABNORMAL LOW (ref 34.0–46.6)
Hemoglobin: 9.3 g/dL — ABNORMAL LOW (ref 11.1–15.9)
Immature Grans (Abs): 0 10*3/uL (ref 0.0–0.1)
Immature Granulocytes: 0 %
Lymphocytes Absolute: 1.1 10*3/uL (ref 0.7–3.1)
Lymphs: 26 %
MCH: 29.4 pg (ref 26.6–33.0)
MCHC: 32.6 g/dL (ref 31.5–35.7)
MCV: 90 fL (ref 79–97)
Monocytes Absolute: 0.4 10*3/uL (ref 0.1–0.9)
Monocytes: 9 %
Neutrophils Absolute: 2.4 10*3/uL (ref 1.4–7.0)
Neutrophils: 60 %
Platelets: 233 10*3/uL (ref 150–450)
RBC: 3.16 x10E6/uL — ABNORMAL LOW (ref 3.77–5.28)
RDW: 12.3 % (ref 11.7–15.4)
WBC: 4.1 10*3/uL (ref 3.4–10.8)

## 2021-08-24 LAB — BASIC METABOLIC PANEL
BUN/Creatinine Ratio: 10 — ABNORMAL LOW (ref 12–28)
BUN: 9 mg/dL — ABNORMAL LOW (ref 10–36)
CO2: 26 mmol/L (ref 20–29)
Calcium: 8.4 mg/dL — ABNORMAL LOW (ref 8.7–10.3)
Chloride: 104 mmol/L (ref 96–106)
Creatinine, Ser: 0.9 mg/dL (ref 0.57–1.00)
Glucose: 84 mg/dL (ref 70–99)
Potassium: 3.5 mmol/L (ref 3.5–5.2)
Sodium: 145 mmol/L — ABNORMAL HIGH (ref 134–144)
eGFR: 60 mL/min/{1.73_m2} (ref >59)

## 2021-08-26 ENCOUNTER — Encounter: Payer: Self-pay | Admitting: Internal Medicine

## 2021-08-26 ENCOUNTER — Ambulatory Visit: Payer: Medicare PPO | Admitting: Internal Medicine

## 2021-08-26 ENCOUNTER — Other Ambulatory Visit: Payer: Self-pay

## 2021-08-26 VITALS — BP 136/98 | HR 88 | Wt 97.0 lb

## 2021-08-26 DIAGNOSIS — F32A Depression, unspecified: Secondary | ICD-10-CM

## 2021-08-26 DIAGNOSIS — Z952 Presence of prosthetic heart valve: Secondary | ICD-10-CM

## 2021-08-26 DIAGNOSIS — J449 Chronic obstructive pulmonary disease, unspecified: Secondary | ICD-10-CM

## 2021-08-26 DIAGNOSIS — F419 Anxiety disorder, unspecified: Secondary | ICD-10-CM | POA: Diagnosis not present

## 2021-08-26 NOTE — Patient Instructions (Signed)
It was a pleasure to see you today and noted that you are doing much better.  Continue current medications and follow-up in June for annual Medicare wellness visit. ?

## 2021-08-26 NOTE — Progress Notes (Signed)
? ?  Subjective:  ? ? Patient ID: Katherine [name], female    DOB: 11/13/1928, 88 y.o.   MRN: [id number] ? ?HPI Remarkable 88 year old Female who underwent TAVR (transcatheter aortic valve replacement) on August 13, 2021.  Was discharged home on March 6. ? ? History of COPD.history of mild mitral stenosis and moderate aortic stenosis on echocardiogram 2019. ? ?  However, was found to have severe aortic stenosis on echocardiogram just before she was to undergo surgery in December 2022 for extensor tendon lengthening right ring and long finger by Dr. Burney Gauze. ? ?She had cardiology follow-up on March 10.  She is on oxygen at the present time.  Becomes short of breath with exertion but doing very well.  Husband is supportive.  Home health is coming to call on patient in her home.  She is getting physical therapy. ? ?Perioperatively had increasing creatinine to 1.65 but that has improved steadily and was 0.90 on March 10.  Electrolytes on March 10 were normal. ? ?Ambulates with help of a walker. ? ? ? ?Review of Systems says she has a fairly good appetite. ? ?   ?Objective:  ? Physical Exam ?Blood pressure 136/98 pulse 88 pulse oximetry 99% on nasal prong oxygen weight 97 pounds BMI 18.33 ? ?In October 2022 she weighed 93 pounds and BMI was 18.16.  She looks much stronger than she did in the Fall 2022 and I believe her appetite has improved. ?Her chest is clear to auscultation.  Cardiac exam: Regular rate and rhythm without murmurs or ectopy.  No lower extremity pitting edema.  She is alert.  She is in no acute distress. ? ? ?   ?Assessment & Plan:  ?Status post TAVR February 2023 and improved ? ?Weight is stable at 97 pounds (BMI 18.33) ? ?History of COPD-currently on home oxygen and stable.  Treated with albuterol inhaler and arformoterol nebulizer.  Pulse oximetry 99% on home oxygen ? ?History of lumbar disc disease status post interbody fusion L4-L5 February 2016 ? ?History of transforaminal lumbar interbody fusion  L3-L4 and posterior lumbar fusion L3-S1 October 2016 ? ?Removal of hardware L5-S1 2017 ? ?History of iron deficiency treated with iron supplement ? ?Longstanding history of depression treated with does a real and Lexapro ? ?History of anxiety treated with low-dose Xanax up to twice daily as needed ? ?Plan: We will plan to do Medicare wellness physical in June 2023.  I am pleased with her postoperative course. ? ?

## 2021-08-29 ENCOUNTER — Ambulatory Visit: Payer: Medicare PPO | Admitting: Nurse Practitioner

## 2021-08-29 ENCOUNTER — Other Ambulatory Visit: Payer: Self-pay

## 2021-08-29 ENCOUNTER — Encounter: Payer: Self-pay | Admitting: Nurse Practitioner

## 2021-08-29 VITALS — BP 138/70 | Wt 94.0 lb

## 2021-08-29 DIAGNOSIS — R911 Solitary pulmonary nodule: Secondary | ICD-10-CM

## 2021-08-29 DIAGNOSIS — J9611 Chronic respiratory failure with hypoxia: Secondary | ICD-10-CM

## 2021-08-29 DIAGNOSIS — I350 Nonrheumatic aortic (valve) stenosis: Secondary | ICD-10-CM | POA: Diagnosis not present

## 2021-08-29 DIAGNOSIS — J449 Chronic obstructive pulmonary disease, unspecified: Secondary | ICD-10-CM | POA: Diagnosis not present

## 2021-08-29 NOTE — Assessment & Plan Note (Signed)
S/p TAVR. Stay complicated by pericardial effusion with tamponade requiring window and chest tube. Discharged 3/6. Recovering well. Follow up with cardiology as scheduled. ?

## 2021-08-29 NOTE — Progress Notes (Addendum)
? ?'@Patient'$  ID: Katherine [name], female    DOB: 01-23-1929, 88 y.o.   MRN: [id number] ? ?No chief complaint on file. ? ? ?Referring provider: ?Elby Showers, MD ? ?HPI: ?88 year old female, former smoker (60 pack years followed for severe COPD, history of positive PPD, lung nodule, upper airway irritation syndrome and chronic cough.  She is a patient of Dr. Agustina Caroli and last seen in office on 07/31/2021 by [facility] NP.  Past medical history significant for aortic stenosis status post TAVR, GERD, HLD, chronic back pain, IDA. ? ?TEST/EVENTS:  ?06/22/2018 PFTs: FVC 1.74 (89), FEV1 0.97 (68), ratio 56, TLC 106%, DLCO 25% ?07/29/2021 cardiac CT: CAD present with arthrosclerosis.  Subcentimeter hypodense bilateral thyroid nodules, unchanged.  Severe centrilobular emphysema with mild diffuse bronchial wall thickening.  Stable curvilinear parenchymal bands in the posterior right upper lobe compatible with nonspecific postinfectious/postinflammatory scarring.  Solid 0.6 cm peripheral right lower lobe pulmonary nodule, stable and considered benign.  Small 0.3 cm posterior left upper lobe solid pulmonary nodule, decreased from 0.4 cm, considered benign.  No acute airspace disease. ?08/14/2021 echocardiogram: EF 60 to 65%.  G1 DD.  RV size and function normal.  PASP normal.  AV replaced without any evidence of regurgitation.  Small pericardial effusion present.  No evidence of tamponade.  Moderate to mild MS. ? ?07/31/2021: OV with Abhijay Morriss NP.  Seen for follow-up after being treated for AECOPD and URI at previous visit.  Treated with prednisone taper.  Started on Zyrtec and Flonase.  Some GERD symptoms started on PPI.  Had also been experiencing persistent worsening shortness of breath over the last few months, suspected to be related to severe AS.  Was awaiting TAVR.  Discussed starting therapy with [facility] for LABA.  Order sent.  Reported some depression without SI or HI.  Understandably frustrated and depressed with activity limitations.   Was on Lexapro.  Advised to follow-up with PCP for further adjustment of medications. ? ?08/29/2021: Today-Hospital follow-up ?Patient presents with husband for hospital follow-up after TAVR.  She did have complications during surgery including pericardial effusion with tamponade.  Pericardial window and chest tube placement were performed.  She had difficulties recovering to her baseline and was sent home with oxygen therapy.  Today, she reports feeling much better although she still does not feel like she has entirely returned to her baseline.  Feels as though she still gets short of breath a little more easily.  No recent cough, fevers, lower extremity swelling or pain.  She continues on 2 L/min supplemental O2 and desires to get off oxygen as soon as possible.  Plans to start working with PT next week and hopes to regain some of her strength back.  She was anticipated to only be in the hospital for a few days and being there for almost a week so understandably weaker than she hoped to be.  She never started the [place] that we had sent last time.  She does continue on her Atrovent and budesonide nebs.  Using albuterol occasionally. ? ?Allergies  ?Allergen Reactions  ? Aspirin Other (See Comments)  ?  Hx of ulcers, stomach issues ?  ? ? ?Immunization History  ?Administered Date(s) Administered  ? Fluad Quad(high Dose 65+) 03/18/2020  ? Influenza Split 02/20/2018  ? Influenza Whole 01/29/2010  ? Influenza, High Dose Seasonal PF 04/13/2014, 02/12/2016, 03/10/2017, 03/19/2018, 04/11/2019  ? Influenza-Unspecified 03/16/2014, 03/05/2021  ? PFIZER(Purple Top)SARS-COV-2 Vaccination 08/08/2019, 08/29/2019, 03/18/2020, 02/26/2021  ? Pneumococcal Conjugate-13 05/12/2016  ? Pneumococcal  Polysaccharide-23 08/08/1996  ? Tdap 04/10/2003  ? Zoster Recombinat (Shingrix) 02/22/2019  ? ? ?Past Medical History:  ?Diagnosis Date  ? Allergy   ? Anemia   ? Arthritis   ? Asthma   ? patient denies  ? B12 deficiency   ? COPD  (chronic obstructive pulmonary disease) ([place])   ? Depression   ? not recent  ? Diverticulosis   ? Esophageal stricture   ? Hepatic cyst 01/30/2010  ? Hiatal hernia 1985  ? patient unaware  ? Hx of adenomatous colonic polyps 2006  ? Hyperlipidemia   ? IBS (irritable bowel syndrome)   ? PONV (postoperative nausea and vomiting)   ? no nausea or vomitting after surgery 07/2014  ? Positive PPD   ? PUD (peptic ulcer disease)   ? S/P TAVR (transcatheter aortic valve replacement) 08/13/2021  ? s/p TAVR with a 27m Edwards S3UR via the right carotid approach by Dr. MAngelena Form& Dr. BCyndia Bent ? Severe aortic stenosis   ? ? ?Tobacco History: ?Social History  ? ?Tobacco Use  ?Smoking Status Former  ? Years: 60.00  ? Types: Cigarettes  ? Quit date: 02/02/2010  ? Years since quitting: 11.5  ?Smokeless Tobacco Never  ? ?Counseling given: Not Answered ? ? ?Outpatient Medications Prior to Visit  ?Medication Sig Dispense Refill  ? albuterol (VENTOLIN HFA) 108 (90 Base) MCG/ACT inhaler INHALE 2 PUFFS INTO THE LUNGS 4 (FOUR) TIMES DAILY AS NEEDED FOR WHEEZING OR SHORTNESS OF BREATH. 36 each 6  ? ALPRAZolam (XANAX) 0.25 MG tablet TAKE ONE TAB UP TO TWICE DAILY AS NEEDED FOR ANXIETY AND SLEEP (Patient taking differently: Take 0.25 mg by mouth at bedtime.) 60 tablet 5  ? arformoterol (BROVANA) 15 MCG/2ML NEBU Take 2 mLs (15 mcg total) by nebulization 2 (two) times daily. 120 mL 2  ? aspirin 81 MG chewable tablet Chew 1 tablet (81 mg total) by mouth daily.    ? Cyanocobalamin (VITAMIN B-12 PO) Take 1 tablet by mouth daily.    ? escitalopram (LEXAPRO) 5 MG tablet TAKE 1 TABLET BY MOUTH EVERY DAY 90 tablet 2  ? fluticasone (FLONASE) 50 MCG/ACT nasal spray Place 1 spray into both nostrils daily. (Patient taking differently: Place 1 spray into both nostrils daily as needed for allergies.) 18.2 mL 2  ? ipratropium (ATROVENT) 0.02 % nebulizer solution TAKE 2.5 MLS (0.5 MG TOTAL) BY NEBULIZATION 4 (FOUR) TIMES DAILY. (Patient taking differently: Take  0.5 mg by nebulization every 6 (six) hours as needed for wheezing or shortness of breath.) 62.5 mL 12  ? Melatonin 10 MG CAPS Take 10 mg by mouth at bedtime.    ? Multiple Vitamins-Minerals (MULTIVITAMIN WITH MINERALS) tablet Take 1 tablet by mouth daily.    ? pantoprazole (PROTONIX) 40 MG tablet Take 1 tablet (40 mg total) by mouth daily. 30 tablet 2  ? Polysaccharide Iron Complex (POLYSACCHARIDE IRON PO) Take 1 capsule by mouth daily.    ? sucralfate (CARAFATE) 1 GM/10ML suspension Take 10 mLs (1 g total) by mouth 4 (four) times daily -  with meals and at bedtime. 420 mL 0  ? traMADol (ULTRAM) 50 MG tablet Take 50 mg by mouth every 6 (six) hours as needed for moderate pain.     ? traZODone (DESYREL) 50 MG tablet TAKE 3 TABLETS BY MOUTH AT BEDTIME (Patient taking differently: Take 100 mg by mouth at bedtime.) 270 tablet 0  ? ?No facility-administered medications prior to visit.  ? ? ? ?Review of Systems:  ? ?  Constitutional: No weight loss or gain, night sweats, fevers, chills. +fatigue ?HEENT: No headaches, difficulty swallowing, tooth/dental problems, or sore throat. No sneezing, itching, ear ache, nasal congestion, or post nasal drip ?CV:  No chest pain, orthopnea, PND, swelling in lower extremities, anasarca, dizziness, palpitations, syncope ?Resp: +shortness of breath with exertion (mild; not quite back to baseline). No excess mucus or change in color of mucus. No productive or non-productive. No hemoptysis. No wheezing.  No chest wall deformity ?GI:  No heartburn, indigestion, abdominal pain, nausea, vomiting, diarrhea, change in bowel habits, loss of appetite, bloody stools.  ?GU: No dysuria, change in color of urine, urgency or frequency.  No flank pain, no hematuria  ?Skin: No rash, lesions, ulcerations ?MSK:  No joint pain or swelling.  No decreased range of motion.  No back pain. ?Neuro: No dizziness or lightheadedness.  ?Psych: No depression or anxiety. Mood stable.  ? ? ? ?Physical Exam: ? ?BP 138/70    Wt 94 lb (42.6 kg)   SpO2 94% Comment: 2lpm  BMI 17.76 kg/m?  ? ?GEN: Pleasant, interactive, chronically-ill appearing, elderly, frail; in no acute distress. ?HEENT:  Normocephalic and atraumatic. PERRLA

## 2021-08-29 NOTE — Assessment & Plan Note (Signed)
Stable and considered benign on most recent CT.  ?

## 2021-08-29 NOTE — Assessment & Plan Note (Addendum)
Improving oxygen requirements; still requiring supplemental O2 with activity. Advised her to continue to use 2 lpm with activity and at night for goal SpO2 >88-90%. Order sent to DME for [place] qualification. Work with PT as scheduled. Hopefully, we will be able to wean her oxygen requirements as she progresses in her recovery, which we discussed and they verbalized understanding. ?

## 2021-08-29 NOTE — Assessment & Plan Note (Signed)
Breathing relatively stable overall since discharge.  Does not feel like she has entirely returned to her baseline.  Suspect that some of this is related to deconditioning and injury from her procedure.  Her oxygen has improved and she has been maintaining in the 90s at rest.  They do note that she does decrease to the high 80s with activity.  Plans to start working with PT next week; hopefully this will help with her recovery. ? ?Patient Instructions  ?-Continue ipatropium 4 times a day ?-Continue budesonide Twice daily. Brush tongue and rinse mouth afterwards ?-Continue Albuterol inhaler 2 puffs or 3 mL neb every 6 hours as needed for shortness of breath or wheezing. Notify if symptoms persist despite rescue inhaler/neb use. ?-Continue carafate 10 mL four times a day with meals and at bedtime ?-Continue Flonase nasal spray 1-2 sprays each nostril daily ?-Continue Zyrtec 10 mg daily  ?-Continue Protonix 40 mg daily 30 minutes before breakfast ?-Continue on supplemental oxygen 2 lpm with activity and at night. Goal oxygen level >88-90% ?  ?Notify if worsening breathlessness, cough, mucus production, fatigue, or wheezing occurs.  ?Maintain up to date vaccinations, including influenza, COVID, and pneumococcal.  ?  ?Follow up in one month with Dr. Lamonte Sakai or Alanson Aly. If symptoms do not improve or worsen, please contact office for sooner follow up or seek emergency care. ? ? ?

## 2021-08-29 NOTE — Patient Instructions (Addendum)
-  Continue ipatropium 4 times a day ?-Continue budesonide Twice daily. Brush tongue and rinse mouth afterwards ?-Continue Albuterol inhaler 2 puffs or 3 mL neb every 6 hours as needed for shortness of breath or wheezing. Notify if symptoms persist despite rescue inhaler/neb use. ?-Continue carafate 10 mL four times a day with meals and at bedtime ?-Continue Flonase nasal spray 1-2 sprays each nostril daily ?-Continue Zyrtec 10 mg daily  ?-Continue Protonix 40 mg daily 30 minutes before breakfast ?-Continue on supplemental oxygen 2 lpm with activity and at night. Goal oxygen level >88-90% ?  ?Notify if worsening breathlessness, cough, mucus production, fatigue, or wheezing occurs.  ?Maintain up to date vaccinations, including influenza, COVID, and pneumococcal.  ?  ?Follow up in one month with Dr. Lamonte Sakai or Alanson Aly. If symptoms do not improve or worsen, please contact office for sooner follow up or seek emergency care. ?

## 2021-08-30 ENCOUNTER — Telehealth: Payer: Self-pay | Admitting: Internal Medicine

## 2021-08-30 ENCOUNTER — Telehealth: Payer: Self-pay | Admitting: Nurse Practitioner

## 2021-08-30 DIAGNOSIS — J9611 Chronic respiratory failure with hypoxia: Secondary | ICD-10-CM

## 2021-08-30 NOTE — Telephone Encounter (Signed)
Faxed signed orders to [facility] for PT and OT ?Fax [phone] ?Phone [phone] ? ? ?

## 2021-08-30 NOTE — Telephone Encounter (Signed)
This message was sent via [place], a product from [employer] http://www.[employer].com/ ? ?                  -------Fax Transmission Report------- ? ?To:               Recipient at [phone] ?Subject:          FW: Hp Scans ?Result:           The transmission was successful. ?Explanation:      All Pages Ok ?Pages Sent:       3 ?Connect Time:     1 minutes, 17 seconds ?Transmit Time:    08/30/2021 14:33 ?Transfer Rate:    14400 ?Status Code:      0000 ?Retry Count:      0 ?Job Id:           [id number] ?Unique Id:        [id number] ?Fax Line:         38 ?Fax Server:       [id number] ? ?

## 2021-08-30 NOTE — Telephone Encounter (Signed)
[place] ? ?Jenny Reichmann called to say we would be receiving home health orders for PT and OT for [name] ?

## 2021-08-30 NOTE — Telephone Encounter (Signed)
Called and spoke with patient's spouse [name]. He stated that he called [facility] earlier today and they did not have the POC eval order. I reviewed the OV note from yesterday and noticed that the order was placed but placed incorrectly. Advised him that I would correct the order. He verbalized understanding.  ? ?Nothing further needed at time of call.  ?

## 2021-09-04 ENCOUNTER — Telehealth: Payer: Self-pay | Admitting: Internal Medicine

## 2021-09-04 NOTE — Telephone Encounter (Signed)
This message was sent via [place], a product from [employer] http://www.[employer].com/ ? ?                  -------Fax Transmission Report------- ? ?To:               Recipient at [phone] ?Subject:          FW: Hp Scans ?Result:           The transmission was successful. ?Explanation:      All Pages Ok ?Pages Sent:       3 ?Connect Time:     1 minutes, 21 seconds ?Transmit Time:    09/04/2021 11:53 ?Transfer Rate:    14400 ?Status Code:      0000 ?Retry Count:      1 ?Job Id:           [id number] ?Unique Id:        [id number] ?Fax Line:         34 ?Fax Server:       [id number] ? ? ?

## 2021-09-04 NOTE — Telephone Encounter (Signed)
Faxed signed Client Coordination Note to [facility] [phone] ? ?HHPT requesting frequency of 2W3 1W5 for Gait Endurance/Breathing, LE strength, Transfers, Standing Balance, and Posture ?

## 2021-09-05 NOTE — Progress Notes (Signed)
?[facility]   ?[place] ?                                    ?Cardiology Office Note:   ? ?Date:  09/06/2021  ? ?ID:  Katherine [name], DOB 1928-10-27, MRN [id number] ? ?PCP:  Elby Showers, MD  ?[facility] Cardiologist:  Lauree Chandler, MD  ?[facility] Electrophysiologist:  None  ? ?Referring MD: Elby Showers, MD  ? ?1 month s/p TAVR ? ?History of Present Illness:   ? ?Katherine [name] is a 88 y.o. female with a hx of prior etoh/tobacco abuse, anemia, arthritis, COPD, HLD, IBS and severe aortic stenosis s/p TAVR (08/13/21) who presents to clinic for follow up.  ?  ?She has been followed by Dr. Renold Genta with moderate aortic stenosis.  A 2D echocardiogram in January 2019 showed a trileaflet aortic valve with moderate thickening and calcification with a mean gradient of 27 mmHg and a peak gradient of 43 mmHg.  Valve area by VTI was 1.44 cm?Marland Kitchen  She has had a several year history of exertional fatigue and shortness of breath which was felt to be due to COPD.  She has been treated with bronchodilators but they do not seem to help that much.  She was being evaluated for hand surgery and the heart murmur was noted and she required a cardiac evaluation.  She had a repeat echocardiogram on 05/21/2021.  This showed a severely calcified aortic valve with a mean gradient that increased to 33.7 mmHg with a peak gradient of 57 mmHg.  Aortic valve area was 0.62 cm? by VTI.  Dimensionless index was 0.2.  She underwent cardiac catheterization on 07/11/2021 showing mild nonobstructive coronary disease.  PA pressures were normal. ?  ?She was evaluated by the multidisciplinary valve team and underwent successful TAVR with a 23 mm Edwards Sapien 3 Ultra Resilia THV via the right carotid approach on 08/13/21. After valve deployment, the patient remained hypotensive. TEE revealed a large pericardial effusion tamponade. Dr. Cyndia Bent placed a pericardial window though the subxiphoid space.  Chest tube was placed and removed the following day. Post operative echo showed EF 60%, normally functioning TAVR with a mean gradient of 10 mmHg and no PVL and small pericardial effusion. Creat bumped from normal to 1.65, likely due to periop hypotension related to severe AS, cardiac perforation with hemopericardium and tamponade, and contrast. This normalized after IVFs. She was also treated for hyperkalemia.  ? ?She has done well in follow up. At last visit, she remained hypoxic and I told her she needed to stay on home 02.  ? ?Today the patient presents to clinic for follow up. Here with her husband. Doing well. Recovering a bit more everyday. No CP. Has SOB with exertion. No LE edema, orthopnea or PND. No dizziness or syncope. No blood in stool or urine. No palpitations. Husband upset she had a prolonged hospital stay and then a new 02 requirement. He would like to see her get off 02.  ? ? ?Past Medical History:  ?Diagnosis Date  ? Allergy   ? Anemia   ? Arthritis   ? Asthma   ? patient denies  ? B12 deficiency   ? COPD (chronic obstructive pulmonary disease) ([place])   ? Depression   ? not recent  ? Diverticulosis   ? Esophageal stricture   ? Hepatic cyst 01/30/2010  ?  Hiatal hernia 1985  ? patient unaware  ? Hx of adenomatous colonic polyps 2006  ? Hyperlipidemia   ? IBS (irritable bowel syndrome)   ? PONV (postoperative nausea and vomiting)   ? no nausea or vomitting after surgery 07/2014  ? Positive PPD   ? PUD (peptic ulcer disease)   ? S/P TAVR (transcatheter aortic valve replacement) 08/13/2021  ? s/p TAVR with a 14m Edwards S3UR via the right carotid approach by Dr. MAngelena Form& Dr. BCyndia Bent ? Severe aortic stenosis   ? ? ?Past Surgical History:  ?Procedure Laterality Date  ? ABDOMINAL HYSTERECTOMY    ? abnormal bleeding  ? APPENDECTOMY    ? BARTHOLIN GLAND CYST EXCISION    ? x2  ? BREAST BIOPSY Bilateral   ? Milk glnd  ? CATARACT EXTRACTION W/ INTRAOCULAR LENS  IMPLANT, BILATERAL Bilateral   ? COLONOSCOPY     ? COLONOSCOPY W/ POLYPECTOMY    ? HARDWARE REMOVAL N/A 12/13/2015  ? Procedure: Removal of HARDWARE  Lumbar Five-Sacral One ;  Surgeon: GKary Kos MD;  [facility];  Service: Neurosurgery;  Laterality: N/A;  ? IR KYPHO LUMBAR INC FX REDUCE BONE BX UNI/BIL CANNULATION INC/IMAGING  07/13/2019  ? PERICARDIAL WINDOW  08/13/2021  ? Procedure: PERICARDIAL WINDOW;  Surgeon: MBurnell Blanks MD;  [place] [facility]  Service: Open Heart Surgery;;  ? RIGHT/LEFT HEART CATH AND CORONARY ANGIOGRAPHY N/A 07/11/2021  ? Procedure: RIGHT/LEFT HEART CATH AND CORONARY ANGIOGRAPHY;  Surgeon: MBurnell Blanks MD;  [facility];  Service: Cardiovascular;  Laterality: N/A;  ? TEE WITHOUT CARDIOVERSION N/A 08/13/2021  ? Procedure: TRANSESOPHAGEAL ECHOCARDIOGRAM (TEE);  Surgeon: MBurnell Blanks MD;  [facility]  Service: Open Heart Surgery;  Laterality: N/A;  ? TRANSCATHETER AORTIC VALVE REPLACEMENT, CAROTID Right 08/13/2021  ? Procedure: Transcatheter Aortic Valve Replacement-Carotid;  Surgeon: MBurnell Blanks MD;  [place] [facility]  Service: Open Heart Surgery;  Laterality: Right;  ? UPPER GI ENDOSCOPY    ? VAGOTOMY AND PYLOROPLASTY    ? [place]  ? ? ?Current Medications: ?Current Meds  ?Medication Sig  ? albuterol (VENTOLIN HFA) 108 (90 Base) MCG/ACT inhaler INHALE 2 PUFFS INTO THE LUNGS 4 (FOUR) TIMES DAILY AS NEEDED FOR WHEEZING OR SHORTNESS OF BREATH.  ? ALPRAZolam (XANAX) 0.25 MG tablet TAKE ONE TAB UP TO TWICE DAILY AS NEEDED FOR ANXIETY AND SLEEP (Patient taking differently: Take 0.25 mg by mouth at bedtime.)  ? arformoterol (BROVANA) 15 MCG/2ML NEBU Take 2 mLs (15 mcg total) by nebulization 2 (two) times daily.  ? aspirin 81 MG chewable tablet Chew 1 tablet (81 mg total) by mouth daily.  ? Cyanocobalamin (VITAMIN B-12 PO) Take 1 tablet by mouth daily.  ? escitalopram (LEXAPRO) 5 MG tablet TAKE 1 TABLET BY MOUTH EVERY DAY  ? fluticasone (FLONASE) 50 MCG/ACT nasal  spray Place 1 spray into both nostrils daily. (Patient taking differently: Place 1 spray into both nostrils daily as needed for allergies.)  ? ipratropium (ATROVENT) 0.02 % nebulizer solution TAKE 2.5 MLS (0.5 MG TOTAL) BY NEBULIZATION 4 (FOUR) TIMES DAILY. (Patient taking differently: Take 0.5 mg by nebulization every 6 (six) hours as needed for wheezing or shortness of breath.)  ? Melatonin 10 MG CAPS Take 10 mg by mouth at bedtime.  ? Multiple Vitamins-Minerals (MULTIVITAMIN WITH MINERALS) tablet Take 1 tablet by mouth daily.  ? pantoprazole (PROTONIX) 40 MG tablet Take 1 tablet (40 mg total) by mouth daily.  ?  Polysaccharide Iron Complex (POLYSACCHARIDE IRON PO) Take 1 capsule by mouth daily.  ? sucralfate (CARAFATE) 1 GM/10ML suspension Take 10 mLs (1 g total) by mouth 4 (four) times daily -  with meals and at bedtime.  ? traMADol (ULTRAM) 50 MG tablet Take 50 mg by mouth every 6 (six) hours as needed for moderate pain.   ? traZODone (DESYREL) 50 MG tablet TAKE 3 TABLETS BY MOUTH AT BEDTIME (Patient taking differently: Take 100 mg by mouth at bedtime.)  ?  ? ?Allergies:   Aspirin  ? ?Social History  ? ?Socioeconomic History  ? Marital status: Married  ?  Spouse name: Not on file  ? Number of children: 2  ? Years of education: Not on file  ? Highest education level: Not on file  ?Occupational History  ?  Employer: RETIRED  ? Occupation: Retired-office work  ?Tobacco Use  ? Smoking status: Former  ?  Years: 60.00  ?  Types: Cigarettes  ?  Quit date: 02/02/2010  ?  Years since quitting: 11.6  ? Smokeless tobacco: Never  ?Vaping Use  ? Vaping Use: Never used  ?Substance and Sexual Activity  ? Alcohol use: No  ?  Comment: quit in 1983  ? Drug use: No  ? Sexual activity: Not on file  ?Other Topics Concern  ? Not on file  ?Social History Narrative  ? Daily caffeine   ? ?Social Determinants of Health  ? ?Financial Resource Strain: Not on file  ?Food Insecurity: Not on file  ?Transportation Needs: Not on file  ?Physical  Activity: Not on file  ?Stress: Not on file  ?Social Connections: Not on file  ?  ? ?Family History: ?The patient's family history includes Irritable bowel syndrome in her father; Ovarian cancer in he

## 2021-09-06 ENCOUNTER — Ambulatory Visit (HOSPITAL_COMMUNITY): Payer: Medicare PPO | Attending: Internal Medicine

## 2021-09-06 ENCOUNTER — Other Ambulatory Visit: Payer: Self-pay

## 2021-09-06 ENCOUNTER — Ambulatory Visit: Payer: Medicare PPO | Admitting: Physician Assistant

## 2021-09-06 VITALS — BP 142/64 | HR 62 | Ht 61.0 in | Wt 93.0 lb

## 2021-09-06 DIAGNOSIS — J449 Chronic obstructive pulmonary disease, unspecified: Secondary | ICD-10-CM

## 2021-09-06 DIAGNOSIS — N289 Disorder of kidney and ureter, unspecified: Secondary | ICD-10-CM | POA: Diagnosis not present

## 2021-09-06 DIAGNOSIS — I3139 Other pericardial effusion (noninflammatory): Secondary | ICD-10-CM

## 2021-09-06 DIAGNOSIS — Z952 Presence of prosthetic heart valve: Secondary | ICD-10-CM | POA: Insufficient documentation

## 2021-09-06 DIAGNOSIS — I350 Nonrheumatic aortic (valve) stenosis: Secondary | ICD-10-CM

## 2021-09-06 LAB — ECHOCARDIOGRAM COMPLETE
AV Mean grad: 7 mmHg
AV Peak grad: 14.3 mmHg
Ao pk vel: 1.89 m/s
Area-P 1/2: 2.24 cm2
S' Lateral: 2 cm

## 2021-09-06 NOTE — Patient Instructions (Addendum)
Medication Instructions:  ?Your physician recommends that you continue on your current medications as directed. Please refer to the Current Medication list given to you today. ? ?*If you need a refill on your cardiac medications before your next appointment, please call your pharmacy* ? ? ?Lab Work: ?none ?If you have labs (blood work) drawn today and your tests are completely normal, you will receive your results only by: ?MyChart Message (if you have MyChart) OR ?A paper copy in the mail ?If you have any lab test that is abnormal or we need to change your treatment, we will call you to review the results. ? ? ?Testing/Procedures: ?Your physician has requested that you have an echocardiogram. Echocardiography is a painless test that uses sound waves to create images of your heart. It provides your doctor with information about the size and shape of your heart and how well your heart?s chambers and valves are working. This procedure takes approximately one hour. There are no restrictions for this procedure. ?Scheduled for 08/15/22 at 9:35 ? ? ?Follow-Up: ?At [facility], you and your health needs are our priority.  As part of our continuing mission to provide you with exceptional heart care, we have created designated Provider Care Teams.  These Care Teams include your primary Cardiologist (physician) and Advanced Practice Providers (APPs -  Physician Assistants and Nurse Practitioners) who all work together to provide you with the care you need, when you need it. ? ?We recommend signing up for the patient portal called "MyChart".  Sign up information is provided on this After Visit Summary.  MyChart is used to connect with patients for Virtual Visits (Telemedicine).  Patients are able to view lab/test results, encounter notes, upcoming appointments, etc.  Non-urgent messages can be sent to your provider as well.   ?To learn more about what you can do with MyChart, go to [email or web address].   ? ?Your next  appointment:   ?01/15/22 at 11:00 ? ?The format for your next appointment:   ?In Person ? ?Provider:   ?Lauree Chandler, MD   ? ? ?Other Instructions ?You are scheduled to see structural heart APP on 08/15/22 after echo ? ?

## 2021-09-09 ENCOUNTER — Encounter: Payer: Self-pay | Admitting: Physician Assistant

## 2021-09-11 DIAGNOSIS — Z952 Presence of prosthetic heart valve: Secondary | ICD-10-CM | POA: Diagnosis not present

## 2021-09-11 DIAGNOSIS — M4316 Spondylolisthesis, lumbar region: Secondary | ICD-10-CM | POA: Diagnosis not present

## 2021-09-11 DIAGNOSIS — M48061 Spinal stenosis, lumbar region without neurogenic claudication: Secondary | ICD-10-CM | POA: Diagnosis not present

## 2021-09-11 NOTE — Telephone Encounter (Signed)
This message was sent via [place], a product from [employer] http://www.biscom.com/ ? ?                  -------Fax Transmission Report------- ? ?To:               Recipient at [phone] ?Subject:          FW: Hp Scans ?Result:           The transmission was successful. ?Explanation:      All Pages Ok ?Pages Sent:       11 ?Connect Time:     4 minutes, 46 seconds ?Transmit Time:    09/11/2021 11:09 ?Transfer Rate:    14400 ?Status Code:      0000 ?Retry Count:      0 ?Job Id:           [id number] ?Unique Id:        [id number] ?Fax Line:         19 ?Fax Server:       [id number] ? ?

## 2021-09-11 NOTE — Telephone Encounter (Signed)
This message was sent via [place], a product from [employer] http://www.bis[employer].com/ ? ?                  -------Fax Transmission Report------- ? ?To:               Recipient at [phone] ?Subject:          FW: Hp Scans ?Result:           The transmission was successful. ?Explanation:      All Pages Ok ?Pages Sent:       11 ?Connect Time:     4 minutes, 46 seconds ?Transmit Time:    09/11/2021 11:09 ?Transfer Rate:    14400 ?Status Code:      0000 ?Retry Count:      0 ?Job Id:           [id number] ?Unique Id:        [id number] ?Fax Line:         19 ?Fax Server:       [id number] ? ?

## 2021-09-13 ENCOUNTER — Other Ambulatory Visit: Payer: Self-pay | Admitting: Internal Medicine

## 2021-09-13 ENCOUNTER — Telehealth: Payer: Self-pay | Admitting: Internal Medicine

## 2021-09-13 NOTE — Telephone Encounter (Signed)
This message was sent via [place], a product from [employer] http://www.bis[employer].com/ ? ?                  -------Fax Transmission Report------- ? ?To:               Recipient at [phone] ?Subject:          FW: Hp Scans ?Result:           The transmission was successful. ?Explanation:      All Pages Ok ?Pages Sent:       7 ?Connect Time:     4 minutes, 34 seconds ?Transmit Time:    09/13/2021 09:58 ?Transfer Rate:    14400 ?Status Code:      0000 ?Retry Count:      0 ?Job Id:           [id number] ?Unique Id:        [id number] ?Fax Line:         44 ?Fax Server:       [id number] ? ? ?

## 2021-09-13 NOTE — Telephone Encounter (Signed)
Faxed signed Certified [place] Orders to [place] [phone]- Phone [phone] ? ?Order # [id number] ? ?Certification 11/17/7996 to 10/31/2021 ?

## 2021-09-19 ENCOUNTER — Other Ambulatory Visit: Payer: Self-pay | Admitting: Internal Medicine

## 2021-09-20 ENCOUNTER — Other Ambulatory Visit: Payer: Self-pay | Admitting: Pulmonary Disease

## 2021-09-25 ENCOUNTER — Inpatient Hospital Stay (HOSPITAL_BASED_OUTPATIENT_CLINIC_OR_DEPARTMENT_OTHER): Payer: Medicare PPO | Admitting: Physician Assistant

## 2021-09-25 ENCOUNTER — Other Ambulatory Visit: Payer: Self-pay

## 2021-09-25 ENCOUNTER — Inpatient Hospital Stay: Payer: Medicare PPO | Attending: Physician Assistant

## 2021-09-25 ENCOUNTER — Other Ambulatory Visit: Payer: Self-pay | Admitting: Physician Assistant

## 2021-09-25 VITALS — BP 108/55 | HR 115 | Temp 97.5°F | Resp 19 | Ht 61.0 in | Wt 93.8 lb

## 2021-09-25 DIAGNOSIS — Z7951 Long term (current) use of inhaled steroids: Secondary | ICD-10-CM | POA: Insufficient documentation

## 2021-09-25 DIAGNOSIS — E785 Hyperlipidemia, unspecified: Secondary | ICD-10-CM | POA: Insufficient documentation

## 2021-09-25 DIAGNOSIS — Z87891 Personal history of nicotine dependence: Secondary | ICD-10-CM | POA: Diagnosis not present

## 2021-09-25 DIAGNOSIS — D508 Other iron deficiency anemias: Secondary | ICD-10-CM

## 2021-09-25 DIAGNOSIS — D509 Iron deficiency anemia, unspecified: Secondary | ICD-10-CM | POA: Diagnosis not present

## 2021-09-25 DIAGNOSIS — E538 Deficiency of other specified B group vitamins: Secondary | ICD-10-CM | POA: Diagnosis not present

## 2021-09-25 DIAGNOSIS — Z7982 Long term (current) use of aspirin: Secondary | ICD-10-CM | POA: Diagnosis not present

## 2021-09-25 DIAGNOSIS — R110 Nausea: Secondary | ICD-10-CM | POA: Diagnosis not present

## 2021-09-25 DIAGNOSIS — Z79899 Other long term (current) drug therapy: Secondary | ICD-10-CM | POA: Insufficient documentation

## 2021-09-25 DIAGNOSIS — Z8711 Personal history of peptic ulcer disease: Secondary | ICD-10-CM | POA: Insufficient documentation

## 2021-09-25 DIAGNOSIS — I350 Nonrheumatic aortic (valve) stenosis: Secondary | ICD-10-CM | POA: Diagnosis not present

## 2021-09-25 DIAGNOSIS — J449 Chronic obstructive pulmonary disease, unspecified: Secondary | ICD-10-CM | POA: Diagnosis not present

## 2021-09-25 LAB — IRON AND IRON BINDING CAPACITY (CC-WL,HP ONLY)
Iron: 57 ug/dL (ref 28–170)
Saturation Ratios: 18 % (ref 10.4–31.8)
TIBC: 313 ug/dL (ref 250–450)
UIBC: 256 ug/dL

## 2021-09-25 LAB — CBC WITH DIFFERENTIAL (CANCER CENTER ONLY)
Abs Immature Granulocytes: 0.03 10*3/uL (ref 0.00–0.07)
Basophils Absolute: 0 10*3/uL (ref 0.0–0.1)
Basophils Relative: 1 %
Eosinophils Absolute: 0.2 10*3/uL (ref 0.0–0.5)
Eosinophils Relative: 2 %
HCT: 34.9 % — ABNORMAL LOW (ref 36.0–46.0)
Hemoglobin: 11.1 g/dL — ABNORMAL LOW (ref 12.0–15.0)
Immature Granulocytes: 0 %
Lymphocytes Relative: 22 %
Lymphs Abs: 1.8 10*3/uL (ref 0.7–4.0)
MCH: 29.1 pg (ref 26.0–34.0)
MCHC: 31.8 g/dL (ref 30.0–36.0)
MCV: 91.4 fL (ref 80.0–100.0)
Monocytes Absolute: 0.5 10*3/uL (ref 0.1–1.0)
Monocytes Relative: 6 %
Neutro Abs: 5.6 10*3/uL (ref 1.7–7.7)
Neutrophils Relative %: 69 %
Platelet Count: 169 10*3/uL (ref 150–400)
RBC: 3.82 MIL/uL — ABNORMAL LOW (ref 3.87–5.11)
RDW: 12.6 % (ref 11.5–15.5)
WBC Count: 8.2 10*3/uL (ref 4.0–10.5)
nRBC: 0 % (ref 0.0–0.2)

## 2021-09-25 LAB — CMP (CANCER CENTER ONLY)
ALT: 16 U/L (ref 0–44)
AST: 37 U/L (ref 15–41)
Albumin: 3.6 g/dL (ref 3.5–5.0)
Alkaline Phosphatase: 55 U/L (ref 38–126)
Anion gap: 9 (ref 5–15)
BUN: 25 mg/dL — ABNORMAL HIGH (ref 8–23)
CO2: 28 mmol/L (ref 22–32)
Calcium: 9.6 mg/dL (ref 8.9–10.3)
Chloride: 105 mmol/L (ref 98–111)
Creatinine: 1.07 mg/dL — ABNORMAL HIGH (ref 0.44–1.00)
GFR, Estimated: 49 mL/min — ABNORMAL LOW (ref >60)
Glucose, Bld: 151 mg/dL — ABNORMAL HIGH (ref 70–99)
Potassium: 3.9 mmol/L (ref 3.5–5.1)
Sodium: 142 mmol/L (ref 135–145)
Total Bilirubin: <0.1 mg/dL — ABNORMAL LOW (ref 0.3–1.2)
Total Protein: 6.7 g/dL (ref 6.5–8.1)

## 2021-09-25 LAB — RETIC PANEL
Immature Retic Fract: 3.8 % (ref 2.3–15.9)
RBC.: 3.9 MIL/uL (ref 3.87–5.11)
Retic Count, Absolute: 29.6 10*3/uL (ref 19.0–186.0)
Retic Ct Pct: 0.8 % (ref 0.4–3.1)
Reticulocyte Hemoglobin: 30.4 pg (ref >27.9)

## 2021-09-25 NOTE — Progress Notes (Signed)
?[place] ?[phone]) [phone]   [phone] ? ?PROGRESS NOTE ? ?Patient Care Team: ?Elby Showers, MD as PCP - General (Internal Medicine) ?Burnell Blanks, MD as PCP - Cardiology (Cardiology) ? ?Hematological/Oncological History ?1) Labs from PCP, Dr. Gerome Apley ?-08/03/2020: WBC 11.0 (H), Hgb 10.8 (L), MCV 92.5, Plt 193 ?-09/04/2020: WBC 5.6, Hgb 10.5 (L), MCV 88.4, Plt 235, Iron 96, TIBC 306, Iron Saturation 31%, Ferritin 17 (L) ?-11/27/2020: WBC 5.9, Hgb 10.6 (L), MCV 90.6,Plt 223, Iron 24 (L), TIBC 322, Iron Saturation 7% (L), Ferritin 12 (L), Vitamin B12 663, Folate 18.2 ?-01/01/2021: WBC 6.2, Hgb 9.4 (L), MCV 91.7, Plt 281, Iron 85, TIBC 374, Iron saturation 23%, Ferritin 11 (L) ? ?2) 01/22/2021: Establish care with Dede Query PA-C ? ?3) 01/29/2021-02/26/2021: Received IV venofer x 5 doses ? ?CHIEF COMPLAINTS/PURPOSE OF CONSULTATION:  ?"Iron deficiency anemia " ? ?HISTORY OF PRESENTING ILLNESS:  ?Katherine Schlichting Soots 88 y.o. female returns for a follow up for iron deficiency anemia. She was last seen on 03/26/2021. In the interim, she was admitted from 08/13/2021-08/19/2021 for TAVR. Patient is accompanied by her husband for this visit.  ? ?At today's visit, Ms. Kluge reports her energy levels are slowly improving. She continues to use a wheelchair to help with ambulation.  She reports ongoing nausea but symptoms do improve with prescribed antiemetics.  She denies any vomiting episodes.  Patient denies any abdominal pain or changes in her bowel habits.  She denies easy bruising or signs of active bleeding.  She denies fevers, chills, night sweats, chest pain, cough, headaches, dizziness or syncopal episodes. She has no other complaints. Rest of the 10 point ROS is below.  ? ?MEDICAL HISTORY:  ?Past Medical History:  ?Diagnosis Date  ? Allergy   ? Anemia   ? Arthritis   ? Asthma   ? patient denies  ? B12 deficiency   ? COPD (chronic obstructive pulmonary disease) ([place])   ?  Depression   ? not recent  ? Diverticulosis   ? Esophageal stricture   ? Hepatic cyst 01/30/2010  ? Hiatal hernia 1985  ? patient unaware  ? Hx of adenomatous colonic polyps 2006  ? Hyperlipidemia   ? IBS (irritable bowel syndrome)   ? PONV (postoperative nausea and vomiting)   ? no nausea or vomitting after surgery 07/2014  ? Positive PPD   ? PUD (peptic ulcer disease)   ? S/P TAVR (transcatheter aortic valve replacement) 08/13/2021  ? s/p TAVR with a 53m Edwards S3UR via the right carotid approach by Dr. MAngelena Form& Dr. BCyndia Bent ? Severe aortic stenosis   ? ? ?SURGICAL HISTORY: ?Past Surgical History:  ?Procedure Laterality Date  ? ABDOMINAL HYSTERECTOMY    ? abnormal bleeding  ? APPENDECTOMY    ? BARTHOLIN GLAND CYST EXCISION    ? x2  ? BREAST BIOPSY Bilateral   ? Milk glnd  ? CATARACT EXTRACTION W/ INTRAOCULAR LENS  IMPLANT, BILATERAL Bilateral   ? COLONOSCOPY    ? COLONOSCOPY W/ POLYPECTOMY    ? HARDWARE REMOVAL N/A 12/13/2015  ? Procedure: Removal of HARDWARE  Lumbar Five-Sacral One ;  Surgeon: GKary Kos MD;  [facility];  Service: Neurosurgery;  Laterality: N/A;  ? IR KYPHO LUMBAR INC FX REDUCE BONE BX UNI/BIL CANNULATION INC/IMAGING  07/13/2019  ? PERICARDIAL WINDOW  08/13/2021  ? Procedure: PERICARDIAL WINDOW;  Surgeon: MBurnell Blanks MD;  [facility];  Service: Open Heart Surgery;;  ? RIGHT/LEFT HEART CATH  AND CORONARY ANGIOGRAPHY N/A 07/11/2021  ? Procedure: RIGHT/LEFT HEART CATH AND CORONARY ANGIOGRAPHY;  Surgeon: Burnell Blanks, MD;  Location: [facility];  Service: Cardiovascular;  Laterality: N/A;  ? TEE WITHOUT CARDIOVERSION N/A 08/13/2021  ? Procedure: TRANSESOPHAGEAL ECHOCARDIOGRAM (TEE);  Surgeon: Burnell Blanks, MD;  Location: [facility];  Service: Open Heart Surgery;  Laterality: N/A;  ? TRANSCATHETER AORTIC VALVE REPLACEMENT, CAROTID Right 08/13/2021  ? Procedure: Transcatheter Aortic Valve Replacement-Carotid;  Surgeon: Burnell Blanks, MD;   Location: [facility];  Service: Open Heart Surgery;  Laterality: Right;  ? UPPER GI ENDOSCOPY    ? VAGOTOMY AND PYLOROPLASTY    ? in [place], [place]  ? ? ?SOCIAL HISTORY: ?Social History  ? ?Socioeconomic History  ? Marital status: Married  ?  Spouse name: Not on file  ? Number of children: 2  ? Years of education: Not on file  ? Highest education level: Not on file  ?Occupational History  ?  Employer: RETIRED  ? Occupation: Retired-office work  ?Tobacco Use  ? Smoking status: Former  ?  Years: 60.00  ?  Types: Cigarettes  ?  Quit date: 02/02/2010  ?  Years since quitting: 11.6  ? Smokeless tobacco: Never  ?Vaping Use  ? Vaping Use: Never used  ?Substance and Sexual Activity  ? Alcohol use: No  ?  Comment: quit in 1983  ? Drug use: No  ? Sexual activity: Not on file  ?Other Topics Concern  ? Not on file  ?Social History Narrative  ? Daily caffeine   ? ?Social Determinants of Health  ? ?Financial Resource Strain: Not on file  ?Food Insecurity: Not on file  ?Transportation Needs: Not on file  ?Physical Activity: Not on file  ?Stress: Not on file  ?Social Connections: Not on file  ?Intimate Partner Violence: Not on file  ? ? ?FAMILY HISTORY: ?Family History  ?Problem Relation Age of Onset  ? Ulcers Father   ? Stroke Father   ? Irritable bowel syndrome Father   ? Ovarian cancer Sister   ? Colon cancer Neg Hx   ? ? ?ALLERGIES:  has No Known Allergies. ? ?MEDICATIONS:  ?Current Outpatient Medications  ?Medication Sig Dispense Refill  ? albuterol (VENTOLIN HFA) 108 (90 Base) MCG/ACT inhaler INHALE 2 PUFFS INTO THE LUNGS 4 (FOUR) TIMES DAILY AS NEEDED FOR WHEEZING OR SHORTNESS OF BREATH. 36 each 6  ? ALPRAZolam (XANAX) 0.25 MG tablet TAKE ONE TAB UP TO TWICE DAILY AS NEEDED FOR ANXIETY AND SLEEP (Patient taking differently: Take 0.25 mg by mouth at bedtime.) 60 tablet 5  ? aspirin 81 MG chewable tablet Chew 1 tablet (81 mg total) by mouth daily.    ? budesonide (PULMICORT) 0.5 MG/2ML nebulizer solution TAKE 2 MLS (0.5 MG TOTAL) BY  NEBULIZATION 2 (TWO) TIMES DAILY. 120 mL 5  ? Cyanocobalamin (VITAMIN B-12 PO) Take 1 tablet by mouth daily.    ? escitalopram (LEXAPRO) 5 MG tablet TAKE 1 TABLET BY MOUTH EVERY DAY 90 tablet 2  ? ipratropium (ATROVENT) 0.02 % nebulizer solution TAKE 2.5 MLS (0.5 MG TOTAL) BY NEBULIZATION 4 (FOUR) TIMES DAILY. (Patient taking differently: Take 0.5 mg by nebulization every 6 (six) hours as needed for wheezing or shortness of breath.) 62.5 mL 12  ? iron polysaccharides (NIFEREX) 150 MG capsule TAKE 1 CAPSULE BY MOUTH TWICE A DAY 60 capsule 1  ? Melatonin 10 MG CAPS Take 10 mg by mouth at bedtime.    ? Multiple Vitamins-Minerals (MULTIVITAMIN  WITH MINERALS) tablet Take 1 tablet by mouth daily.    ? Polysaccharide Iron Complex (POLYSACCHARIDE IRON PO) Take 1 capsule by mouth daily.    ? promethazine (PHENERGAN) 12.5 MG tablet TAKE 1 TABLET (12.5 MG TOTAL) BY MOUTH EVERY 8 (EIGHT) HOURS AS NEEDED FOR NAUSEA OR VOMITING. 30 tablet 3  ? traMADol (ULTRAM) 50 MG tablet Take 50 mg by mouth every 6 (six) hours as needed for moderate pain.     ? traZODone (DESYREL) 50 MG tablet TAKE 3 TABLETS BY MOUTH AT BEDTIME (Patient taking differently: Take 100 mg by mouth at bedtime.) 270 tablet 0  ? arformoterol (BROVANA) 15 MCG/2ML NEBU Take 2 mLs (15 mcg total) by nebulization 2 (two) times daily. (Patient not taking: Reported on 09/25/2021) 120 mL 2  ? fluticasone (FLONASE) 50 MCG/ACT nasal spray Place 1 spray into both nostrils daily. (Patient not taking: Reported on 09/25/2021) 18.2 mL 2  ? pantoprazole (PROTONIX) 40 MG tablet Take 1 tablet (40 mg total) by mouth daily. (Patient not taking: Reported on 09/25/2021) 30 tablet 2  ? sucralfate (CARAFATE) 1 GM/10ML suspension Take 10 mLs (1 g total) by mouth 4 (four) times daily -  with meals and at bedtime. (Patient not taking: Reported on 09/25/2021) 420 mL 0  ? ?No current facility-administered medications for this visit.  ? ? ?REVIEW OF SYSTEMS:   ?Constitutional: ( - ) fevers, ( - )   chills , ( - ) night sweats ?Eyes: ( - ) blurriness of vision, ( - ) double vision, ( - ) watery eyes ?Ears, nose, mouth, throat, and face: ( - ) mucositis, ( - ) sore throat ?Respiratory: ( - ) cough, (-) dyspnea

## 2021-09-26 LAB — FERRITIN: Ferritin: 67 ng/mL (ref 11–307)

## 2021-09-30 ENCOUNTER — Encounter: Payer: Self-pay | Admitting: Physician Assistant

## 2021-09-30 ENCOUNTER — Ambulatory Visit (INDEPENDENT_AMBULATORY_CARE_PROVIDER_SITE_OTHER): Payer: Medicare PPO | Admitting: Nurse Practitioner

## 2021-09-30 ENCOUNTER — Encounter: Payer: Self-pay | Admitting: Nurse Practitioner

## 2021-09-30 DIAGNOSIS — J449 Chronic obstructive pulmonary disease, unspecified: Secondary | ICD-10-CM | POA: Diagnosis not present

## 2021-09-30 DIAGNOSIS — J9611 Chronic respiratory failure with hypoxia: Secondary | ICD-10-CM | POA: Diagnosis not present

## 2021-09-30 DIAGNOSIS — I350 Nonrheumatic aortic (valve) stenosis: Secondary | ICD-10-CM | POA: Diagnosis not present

## 2021-09-30 NOTE — Progress Notes (Signed)
? ?'@Patient'$  ID: Katherine [name], female    DOB: 1929/01/22, 88 y.o.   MRN: [id number] ? ?Chief Complaint  ?Patient presents with  ? Hospitalization Follow-up  ?  Went to have heart valve replaced and came home with o2. Been on o2 4-5wks  ? ? ?Referring provider: ?Elby Showers, MD ? ?HPI: ?88 year old female, former smoker (60 pack years) followed for severe COPD, history of positive PPD, lung nodule, upper airway irritation syndrome and chronic cough.  She is a patient of Dr. Agustina Caroli and was last seen in office on 08/29/2021 by [facility] NP.  Past medical history significant for aortic stenosis status post TAVR, GERD, HLD, chronic back pain, IDA ? ?TEST/EVENTS:  ?06/22/2018 PFTs: FVC 1.74 (89), FEV1 0.97 (68), ratio 56, TLC 106%, DLCO 25% ?07/29/2021 cardiac CT: CAD present with arthrosclerosis.  Subcentimeter hypodense bilateral thyroid nodules, unchanged.  Severe centrilobular emphysema with mild diffuse bronchial wall thickening.  Stable curvilinear parenchymal bands in the posterior right upper lobe compatible with nonspecific postinfectious/postinflammatory scarring.  Solid 0.6 cm peripheral right lower lobe pulmonary nodule, stable and considered benign.  Small 0.3 cm posterior left upper lobe solid pulmonary nodule, decreased from 0.4 cm, considered benign.  No acute airspace disease. ?08/14/2021 echocardiogram: EF 60 to 65%.  G1 DD.  RV size and function normal.  PASP normal.  AV replaced without any evidence of regurgitation.  Small pericardial effusion present.  No evidence of tamponade.  Moderate to mild MS. ?08/14/2021 CXR 1 view: Chest tube to mid to right mediastinum/hemithorax.  Interval decrease in the prior basilar mild interstitial thickening.  No acute pulmonary process noted..  Chronic emphysematous changes present. Status post TAVR ?  ?07/31/2021: OV with Colandra Ohanian NP.  Seen for follow-up after being treated for AECOPD and URI at previous visit.  Treated with prednisone taper.  Started on Zyrtec and Flonase.   Some GERD symptoms started on PPI.  Had also been experiencing persistent worsening shortness of breath over the last few months, suspected to be related to severe AS.  Was awaiting TAVR.  Discussed starting therapy with Brovana for LABA.  Order sent.  Reported some depression without SI or HI.  Understandably frustrated and depressed with activity limitations.  Was on Lexapro.  Advised to follow-up with PCP for further adjustment of medications. ? ?08/29/2021: OV with Sukaina Toothaker NP for follow-up after TAVR complicated by pericardial effusion with tamponade.  Pericardial window and chest tube placement was performed.  Patient experienced difficulties recovering to her baseline after surgery and was sent home with oxygen therapy.  At visit, she reported feeling much better and felt her breathing had improved although she did not feel entirely back to her baseline.  Still felt as though she got more short of breath than she did previously.  Continued on 2 L supplemental O2 and desires to get off oxygen as soon as possible. Still having some desaturations at home to the 80's on room air with activities. Plans to work with PT next week. Advised to continue supplemental 2 lpm with activity and at night. Advised we will recheck at her next visit to see if we can wean.  ? ?09/30/2021: Today - follow up ?Patient presents today with husband for follow up. She reports feeling relatively well and feels like she has regained some of her strength back. Breathing overall is stable when compared to our last visit. She does still have some shortness of breath with exertion but feels like it is improved some. Recovering well after  her TAVR surgery without any concerns aside from wanting to come off oxygen. She did not have any O2 requirement prior to her hospital stay but suffered from pericardial effusion with tamponade resulting in hypotension during her TAVR and required chest tube placement which resulted in prolonged hospital stay. She  is still on 2 lpm supplemental oxygen and her husband is curious about when she will be able to come off of it. Denies cough, palpitations, chest pain or lower extremity swelling. Continues on neb therapies. Never started on Brovana nebs previously prescribed.  ? ?No Known Allergies ? ?Immunization History  ?Administered Date(s) Administered  ? Fluad Quad(high Dose 65+) 03/18/2020  ? Influenza Split 02/20/2018  ? Influenza Whole 01/29/2010  ? Influenza, High Dose Seasonal PF 04/13/2014, 02/12/2016, 03/10/2017, 03/19/2018, 04/11/2019  ? Influenza-Unspecified 03/16/2014, 03/05/2021  ? PFIZER(Purple Top)SARS-COV-2 Vaccination 08/08/2019, 08/29/2019, 03/18/2020, 02/26/2021  ? Pneumococcal Conjugate-13 05/12/2016  ? Pneumococcal Polysaccharide-23 08/08/1996  ? Tdap 04/10/2003  ? Zoster Recombinat (Shingrix) 02/22/2019  ? ? ?Past Medical History:  ?Diagnosis Date  ? Allergy   ? Anemia   ? Arthritis   ? Asthma   ? patient denies  ? B12 deficiency   ? COPD (chronic obstructive pulmonary disease) ([place])   ? Depression   ? not recent  ? Diverticulosis   ? Esophageal stricture   ? Hepatic cyst 01/30/2010  ? Hiatal hernia 1985  ? patient unaware  ? Hx of adenomatous colonic polyps 2006  ? Hyperlipidemia   ? IBS (irritable bowel syndrome)   ? PONV (postoperative nausea and vomiting)   ? no nausea or vomitting after surgery 07/2014  ? Positive PPD   ? PUD (peptic ulcer disease)   ? S/P TAVR (transcatheter aortic valve replacement) 08/13/2021  ? s/p TAVR with a 79m Edwards S3UR via the right carotid approach by Dr. MAngelena Form& Dr. BCyndia Bent ? Severe aortic stenosis   ? ? ?Tobacco History: ?Social History  ? ?Tobacco Use  ?Smoking Status Former  ? Years: 60.00  ? Types: Cigarettes  ? Quit date: 02/02/2010  ? Years since quitting: 11.6  ?Smokeless Tobacco Never  ? ?Counseling given: Not Answered ? ? ?Outpatient Medications Prior to Visit  ?Medication Sig Dispense Refill  ? albuterol (VENTOLIN HFA) 108 (90 Base) MCG/ACT inhaler INHALE 2  PUFFS INTO THE LUNGS 4 (FOUR) TIMES DAILY AS NEEDED FOR WHEEZING OR SHORTNESS OF BREATH. 36 each 6  ? ALPRAZolam (XANAX) 0.25 MG tablet TAKE ONE TAB UP TO TWICE DAILY AS NEEDED FOR ANXIETY AND SLEEP (Patient taking differently: Take 0.25 mg by mouth at bedtime.) 60 tablet 5  ? aspirin 81 MG chewable tablet Chew 1 tablet (81 mg total) by mouth daily.    ? budesonide (PULMICORT) 0.5 MG/2ML nebulizer solution TAKE 2 MLS (0.5 MG TOTAL) BY NEBULIZATION 2 (TWO) TIMES DAILY. 120 mL 5  ? Cyanocobalamin (VITAMIN B-12 PO) Take 1 tablet by mouth daily.    ? ipratropium (ATROVENT) 0.02 % nebulizer solution TAKE 2.5 MLS (0.5 MG TOTAL) BY NEBULIZATION 4 (FOUR) TIMES DAILY. (Patient taking differently: Take 0.5 mg by nebulization every 6 (six) hours as needed for wheezing or shortness of breath.) 62.5 mL 12  ? Melatonin 10 MG CAPS Take 10 mg by mouth at bedtime.    ? Multiple Vitamins-Minerals (MULTIVITAMIN WITH MINERALS) tablet Take 1 tablet by mouth daily.    ? Polysaccharide Iron Complex (POLYSACCHARIDE IRON PO) Take 1 capsule by mouth daily.    ? promethazine (PHENERGAN) 12.5 MG tablet TAKE  1 TABLET (12.5 MG TOTAL) BY MOUTH EVERY 8 (EIGHT) HOURS AS NEEDED FOR NAUSEA OR VOMITING. 30 tablet 3  ? traMADol (ULTRAM) 50 MG tablet Take 50 mg by mouth every 6 (six) hours as needed for moderate pain.     ? traZODone (DESYREL) 50 MG tablet TAKE 3 TABLETS BY MOUTH AT BEDTIME (Patient taking differently: Take 100 mg by mouth at bedtime.) 270 tablet 0  ? escitalopram (LEXAPRO) 5 MG tablet TAKE 1 TABLET BY MOUTH EVERY DAY 90 tablet 2  ? fluticasone (FLONASE) 50 MCG/ACT nasal spray Place 1 spray into both nostrils daily. (Patient not taking: Reported on 09/25/2021) 18.2 mL 2  ? iron polysaccharides (NIFEREX) 150 MG capsule TAKE 1 CAPSULE BY MOUTH TWICE A DAY (Patient not taking: Reported on 09/30/2021) 60 capsule 1  ? ?No facility-administered medications prior to visit.  ? ? ? ?Review of Systems:  ? ?Constitutional: No weight loss or gain,  night sweats, fevers, chills +fatigue (improving) ?HEENT: No headaches, difficulty swallowing, tooth/dental problems, or sore throat. No sneezing, itching, ear ache, nasal congestion, or post nasal drip ?

## 2021-09-30 NOTE — Patient Instructions (Addendum)
-  Continue ipatropium 4 times a day ?-Continue budesonide Twice daily. Brush tongue and rinse mouth afterwards ?-Continue Albuterol inhaler 2 puffs or 3 mL neb every 6 hours as needed for shortness of breath or wheezing. Notify if symptoms persist despite rescue inhaler/neb use. ?-Continue carafate 10 mL four times a day with meals and at bedtime ?-Continue Flonase nasal spray 1-2 sprays each nostril daily ?-Continue Zyrtec 10 mg daily  ?-Continue Protonix 40 mg daily 30 minutes before breakfast ?-Continue on supplemental oxygen 2 lpm with activity and at night. Goal oxygen level >88-90% ?  ?Notify if worsening breathlessness, cough, mucus production, fatigue, or wheezing occurs.  ?Maintain up to date vaccinations, including influenza, COVID, and pneumococcal.  ?  ?Follow up in three months with Dr. Lamonte Sakai or Alanson Aly. If symptoms do not improve or worsen, please contact office for sooner follow up or seek emergency care. ?

## 2021-10-01 ENCOUNTER — Other Ambulatory Visit: Payer: Self-pay | Admitting: Internal Medicine

## 2021-10-01 NOTE — Assessment & Plan Note (Signed)
Still requiring supplemental O2 with activity. Advised her to continue to use 2 lpm with activity and at night for goal SpO2 >88-90%. Order previously sent to DME for POC qualification - will follow up on this today. Advised she continue to work with PT as scheduled. Hopefully, we will be able to wean her oxygen requirements as she progresses in her recovery, which we discussed and they verbalized understanding. ?

## 2021-10-01 NOTE — Assessment & Plan Note (Signed)
S/p TAVR. Stay complicated by pericardial effusion with tamponade requiring window and chest tube. Discharged 3/6. Recovering well. Follow up with cardiology as scheduled. ?

## 2021-10-01 NOTE — Assessment & Plan Note (Signed)
Slowly returning to baseline after hospitalization. Maintained on neb therapies with budesonide, ipatropium and albuterol PRN. We discussed adding on Brovana previously but they never received it; they feel like she doesn't need it at this point. Activity tolerance is slowly improving.  ? ?Patient Instructions  ?-Continue ipatropium 4 times a day ?-Continue budesonide Twice daily. Brush tongue and rinse mouth afterwards ?-Continue Albuterol inhaler 2 puffs or 3 mL neb every 6 hours as needed for shortness of breath or wheezing. Notify if symptoms persist despite rescue inhaler/neb use. ?-Continue carafate 10 mL four times a day with meals and at bedtime ?-Continue Flonase nasal spray 1-2 sprays each nostril daily ?-Continue Zyrtec 10 mg daily  ?-Continue Protonix 40 mg daily 30 minutes before breakfast ?-Continue on supplemental oxygen 2 lpm with activity and at night. Goal oxygen level >88-90% ?  ?Notify if worsening breathlessness, cough, mucus production, fatigue, or wheezing occurs.  ?Maintain up to date vaccinations, including influenza, COVID, and pneumococcal.  ?  ?Follow up in three months with Dr. Lamonte Sakai or Alanson Aly. If symptoms do not improve or worsen, please contact office for sooner follow up or seek emergency care. ? ? ?

## 2021-10-04 ENCOUNTER — Encounter: Payer: Self-pay | Admitting: Nurse Practitioner

## 2021-10-07 DIAGNOSIS — G894 Chronic pain syndrome: Secondary | ICD-10-CM | POA: Diagnosis not present

## 2021-10-07 DIAGNOSIS — M4316 Spondylolisthesis, lumbar region: Secondary | ICD-10-CM | POA: Diagnosis not present

## 2021-10-07 DIAGNOSIS — M47816 Spondylosis without myelopathy or radiculopathy, lumbar region: Secondary | ICD-10-CM | POA: Diagnosis not present

## 2021-10-07 DIAGNOSIS — Z79891 Long term (current) use of opiate analgesic: Secondary | ICD-10-CM | POA: Diagnosis not present

## 2021-10-07 DIAGNOSIS — M47818 Spondylosis without myelopathy or radiculopathy, sacral and sacrococcygeal region: Secondary | ICD-10-CM | POA: Diagnosis not present

## 2021-10-07 DIAGNOSIS — M961 Postlaminectomy syndrome, not elsewhere classified: Secondary | ICD-10-CM | POA: Diagnosis not present

## 2021-10-10 ENCOUNTER — Encounter (HOSPITAL_COMMUNITY): Payer: Self-pay

## 2021-10-10 ENCOUNTER — Other Ambulatory Visit: Payer: Self-pay

## 2021-10-10 ENCOUNTER — Emergency Department (HOSPITAL_COMMUNITY): Payer: Medicare PPO

## 2021-10-10 ENCOUNTER — Observation Stay (HOSPITAL_COMMUNITY)
Admission: EM | Admit: 2021-10-10 | Discharge: 2021-10-12 | Disposition: A | Discharge status: Home / Self Care | Payer: Medicare PPO | Attending: Internal Medicine | Admitting: Internal Medicine

## 2021-10-10 DIAGNOSIS — Z7982 Long term (current) use of aspirin: Secondary | ICD-10-CM | POA: Insufficient documentation

## 2021-10-10 DIAGNOSIS — J449 Chronic obstructive pulmonary disease, unspecified: Secondary | ICD-10-CM | POA: Diagnosis not present

## 2021-10-10 DIAGNOSIS — Z952 Presence of prosthetic heart valve: Secondary | ICD-10-CM

## 2021-10-10 DIAGNOSIS — R410 Disorientation, unspecified: Secondary | ICD-10-CM | POA: Diagnosis not present

## 2021-10-10 DIAGNOSIS — Z87891 Personal history of nicotine dependence: Secondary | ICD-10-CM | POA: Insufficient documentation

## 2021-10-10 DIAGNOSIS — J45909 Unspecified asthma, uncomplicated: Secondary | ICD-10-CM | POA: Diagnosis not present

## 2021-10-10 DIAGNOSIS — E440 Moderate protein-calorie malnutrition: Secondary | ICD-10-CM | POA: Diagnosis not present

## 2021-10-10 DIAGNOSIS — F339 Major depressive disorder, recurrent, unspecified: Secondary | ICD-10-CM | POA: Diagnosis present

## 2021-10-10 DIAGNOSIS — R4182 Altered mental status, unspecified: Secondary | ICD-10-CM | POA: Diagnosis not present

## 2021-10-10 DIAGNOSIS — D649 Anemia, unspecified: Secondary | ICD-10-CM | POA: Insufficient documentation

## 2021-10-10 DIAGNOSIS — F419 Anxiety disorder, unspecified: Secondary | ICD-10-CM

## 2021-10-10 DIAGNOSIS — N390 Urinary tract infection, site not specified: Secondary | ICD-10-CM | POA: Diagnosis not present

## 2021-10-10 DIAGNOSIS — J9611 Chronic respiratory failure with hypoxia: Secondary | ICD-10-CM | POA: Diagnosis present

## 2021-10-10 DIAGNOSIS — F32A Depression, unspecified: Secondary | ICD-10-CM | POA: Diagnosis present

## 2021-10-10 DIAGNOSIS — I350 Nonrheumatic aortic (valve) stenosis: Secondary | ICD-10-CM

## 2021-10-10 DIAGNOSIS — S0990XA Unspecified injury of head, initial encounter: Secondary | ICD-10-CM | POA: Diagnosis not present

## 2021-10-10 LAB — COMPREHENSIVE METABOLIC PANEL
ALT: 20 U/L (ref 0–44)
AST: 33 U/L (ref 15–41)
Albumin: 3.6 g/dL (ref 3.5–5.0)
Alkaline Phosphatase: 53 U/L (ref 38–126)
Anion gap: 6 (ref 5–15)
BUN: 20 mg/dL (ref 8–23)
CO2: 26 mmol/L (ref 22–32)
Calcium: 9.3 mg/dL (ref 8.9–10.3)
Chloride: 109 mmol/L (ref 98–111)
Creatinine, Ser: 0.97 mg/dL (ref 0.44–1.00)
GFR, Estimated: 55 mL/min — ABNORMAL LOW (ref >60)
Glucose, Bld: 107 mg/dL — ABNORMAL HIGH (ref 70–99)
Potassium: 3.5 mmol/L (ref 3.5–5.1)
Sodium: 141 mmol/L (ref 135–145)
Total Bilirubin: 0.4 mg/dL (ref 0.3–1.2)
Total Protein: 6.5 g/dL (ref 6.5–8.1)

## 2021-10-10 LAB — TROPONIN I (HIGH SENSITIVITY): Troponin I (High Sensitivity): 8 ng/L (ref <18)

## 2021-10-10 NOTE — ED Triage Notes (Signed)
Pt family states that she has been having some confusion off and on for the past few days.  ?

## 2021-10-11 ENCOUNTER — Encounter (HOSPITAL_COMMUNITY): Payer: Self-pay | Admitting: Emergency Medicine

## 2021-10-11 DIAGNOSIS — E440 Moderate protein-calorie malnutrition: Secondary | ICD-10-CM

## 2021-10-11 DIAGNOSIS — R4182 Altered mental status, unspecified: Secondary | ICD-10-CM | POA: Diagnosis not present

## 2021-10-11 DIAGNOSIS — D649 Anemia, unspecified: Secondary | ICD-10-CM

## 2021-10-11 DIAGNOSIS — F419 Anxiety disorder, unspecified: Secondary | ICD-10-CM

## 2021-10-11 DIAGNOSIS — N390 Urinary tract infection, site not specified: Secondary | ICD-10-CM | POA: Diagnosis present

## 2021-10-11 LAB — CBC WITH DIFFERENTIAL/PLATELET
Abs Immature Granulocytes: 0.02 10*3/uL (ref 0.00–0.07)
Basophils Absolute: 0.1 10*3/uL (ref 0.0–0.1)
Basophils Relative: 1 %
Eosinophils Absolute: 0.2 10*3/uL (ref 0.0–0.5)
Eosinophils Relative: 3 %
HCT: 32.6 % — ABNORMAL LOW (ref 36.0–46.0)
Hemoglobin: 10.5 g/dL — ABNORMAL LOW (ref 12.0–15.0)
Immature Granulocytes: 0 %
Lymphocytes Relative: 24 %
Lymphs Abs: 1.4 10*3/uL (ref 0.7–4.0)
MCH: 29.1 pg (ref 26.0–34.0)
MCHC: 32.2 g/dL (ref 30.0–36.0)
MCV: 90.3 fL (ref 80.0–100.0)
Monocytes Absolute: 0.4 10*3/uL (ref 0.1–1.0)
Monocytes Relative: 7 %
Neutro Abs: 3.9 10*3/uL (ref 1.7–7.7)
Neutrophils Relative %: 65 %
Platelets: 157 10*3/uL (ref 150–400)
RBC: 3.61 MIL/uL — ABNORMAL LOW (ref 3.87–5.11)
RDW: 12.6 % (ref 11.5–15.5)
WBC: 6 10*3/uL (ref 4.0–10.5)
nRBC: 0 % (ref 0.0–0.2)

## 2021-10-11 LAB — URINALYSIS, ROUTINE W REFLEX MICROSCOPIC
Bacteria, UA: NONE SEEN
Bilirubin Urine: NEGATIVE
Glucose, UA: NEGATIVE mg/dL
Hgb urine dipstick: NEGATIVE
Ketones, ur: NEGATIVE mg/dL
Nitrite: NEGATIVE
Protein, ur: NEGATIVE mg/dL
Specific Gravity, Urine: 1.012 (ref 1.005–1.030)
pH: 5 (ref 5.0–8.0)

## 2021-10-11 LAB — IRON AND TIBC
Iron: 82 ug/dL (ref 28–170)
Saturation Ratios: 29 % (ref 10.4–31.8)
TIBC: 285 ug/dL (ref 250–450)
UIBC: 203 ug/dL

## 2021-10-11 LAB — BLOOD GAS, ARTERIAL
Acid-Base Excess: 6.1 mmol/L — ABNORMAL HIGH (ref 0.0–2.0)
Bicarbonate: 31.2 mmol/L — ABNORMAL HIGH (ref 20.0–28.0)
Drawn by: 422461
FIO2: 21 %
O2 Saturation: 97.1 %
Patient temperature: 36.5
pCO2 arterial: 45 mmHg (ref 32–48)
pH, Arterial: 7.45 (ref 7.35–7.45)
pO2, Arterial: 111 mmHg — ABNORMAL HIGH (ref 83–108)

## 2021-10-11 LAB — TROPONIN I (HIGH SENSITIVITY): Troponin I (High Sensitivity): 11 ng/L (ref <18)

## 2021-10-11 MED ORDER — ALPRAZOLAM 0.25 MG PO TABS
0.2500 mg | ORAL_TABLET | Freq: Every day | ORAL | Status: DC
Start: 2021-10-11 — End: 2021-10-12
  Administered 2021-10-11: 0.25 mg via ORAL
  Filled 2021-10-11: qty 1

## 2021-10-11 MED ORDER — ADULT MULTIVITAMIN W/MINERALS CH
1.0000 | ORAL_TABLET | Freq: Every day | ORAL | Status: DC
  Administered 2021-10-11 – 2021-10-12 (×2): 1 via ORAL
  Filled 2021-10-11 (×2): qty 1

## 2021-10-11 MED ORDER — CEFTRIAXONE SODIUM 1 G IJ SOLR
1.0000 g | Freq: Once | INTRAMUSCULAR | Status: AC
  Administered 2021-10-11: 1 g via INTRAVENOUS
  Filled 2021-10-11: qty 10

## 2021-10-11 MED ORDER — ASPIRIN 81 MG PO CHEW
81.0000 mg | CHEWABLE_TABLET | Freq: Every day | ORAL | Status: DC
  Administered 2021-10-11 – 2021-10-12 (×2): 81 mg via ORAL
  Filled 2021-10-11 (×2): qty 1

## 2021-10-11 MED ORDER — ESCITALOPRAM OXALATE 10 MG PO TABS
5.0000 mg | ORAL_TABLET | Freq: Every day | ORAL | Status: DC
  Administered 2021-10-11: 5 mg via ORAL
  Filled 2021-10-11: qty 1

## 2021-10-11 MED ORDER — ACETAMINOPHEN 325 MG PO TABS: 650.0000 mg | ORAL_TABLET | Freq: Four times a day (QID) | ORAL | Status: DC | PRN

## 2021-10-11 MED ORDER — TRAZODONE HCL 100 MG PO TABS
100.0000 mg | ORAL_TABLET | Freq: Every day | ORAL | Status: DC
  Administered 2021-10-11: 100 mg via ORAL
  Filled 2021-10-11: qty 1

## 2021-10-11 MED ORDER — BUDESONIDE 0.5 MG/2ML IN SUSP: 0.5000 mg | Freq: Every day | RESPIRATORY_TRACT | Status: DC | PRN

## 2021-10-11 MED ORDER — SODIUM CHLORIDE 0.9 % IV SOLN: Freq: Once | INTRAVENOUS | Status: AC

## 2021-10-11 MED ORDER — SODIUM CHLORIDE 0.9 % IV SOLN
1.0000 g | INTRAVENOUS | Status: DC
Start: 2021-10-12 — End: 2021-10-12
  Administered 2021-10-12: 1 g via INTRAVENOUS
  Filled 2021-10-11: qty 10

## 2021-10-11 MED ORDER — PROCHLORPERAZINE EDISYLATE 10 MG/2ML IJ SOLN
10.0000 mg | Freq: Four times a day (QID) | INTRAMUSCULAR | Status: DC | PRN
  Administered 2021-10-11: 10 mg via INTRAVENOUS
  Filled 2021-10-11: qty 2

## 2021-10-11 MED ORDER — IPRATROPIUM BROMIDE 0.02 % IN SOLN: 0.5000 mg | Freq: Four times a day (QID) | RESPIRATORY_TRACT | Status: DC | PRN

## 2021-10-11 MED ORDER — ENSURE ENLIVE PO LIQD
237.0000 mL | Freq: Two times a day (BID) | ORAL | Status: DC
  Administered 2021-10-11 – 2021-10-12 (×2): 237 mL via ORAL

## 2021-10-11 MED ORDER — ALBUTEROL SULFATE (2.5 MG/3ML) 0.083% IN NEBU: 3.0000 mL | INHALATION_SOLUTION | RESPIRATORY_TRACT | Status: DC | PRN

## 2021-10-11 MED ORDER — ACETAMINOPHEN 650 MG RE SUPP: 650.0000 mg | Freq: Four times a day (QID) | RECTAL | Status: DC | PRN

## 2021-10-11 NOTE — Evaluation (Signed)
Physical Therapy Evaluation ?Patient Details ?Name: Katherine [name] ?MRN: [id number] ?DOB: 1929-01-22 ?Today's Date: 10/11/2021 ? ?History of Present Illness ? 88 y.o. female with medical history significant of COPD, chronic hypoxic respiratory failure, aortic stenosis s/p TAVR 08/13/21, anxiety/depression. Presenting with confusion. Dx of UTI.  ?Clinical Impression ? Pt is mobilizing well, she ambulated 54' with a RW, no loss of balance. She is oriented to self, month/year, and location and can follow 1 step commands well. She appears to be near baseline with mobility and is ready to DC home from a PT standpoint. No further PT indicated, will sign off.    ?   ? ?Recommendations for follow up therapy are one component of a multi-disciplinary discharge planning process, led by the attending physician.  Recommendations may be updated based on patient status, additional functional criteria and insurance authorization. ? ?Follow Up Recommendations No PT follow up ? ?  ?Assistance Recommended at Discharge    ?Patient can return home with the following ? A little help with bathing/dressing/bathroom;Assistance with cooking/housework;Assist for transportation;Help with stairs or ramp for entrance ? ?  ?Equipment Recommendations    ?Recommendations for Other Services ?    ?  ?Functional Status Assessment Patient has not had a recent decline in their functional status  ? ?  ?Precautions / Restrictions Precautions ?Precautions: Fall ?Restrictions ?Weight Bearing Restrictions: No  ? ?  ? ?Mobility ? Bed Mobility ?  ?  ?  ?  ?  ?  ?  ?General bed mobility comments: up in recliner ?  ? ?Transfers ?Overall transfer level: Needs assistance ?Equipment used: None ?  ?  ?  ?  ?  ?  ?  ?General transfer comment: supervision for safety 2* recent AMS on day of admission ?  ? ?Ambulation/Gait ?Ambulation/Gait assistance: Min assist ?Gait Distance (Feet): 75 Feet ?Assistive device: Rolling walker (2 wheels) ?Gait Pattern/deviations:  Step-through pattern, Decreased stride length ?Gait velocity: WFL ?  ?  ?General Gait Details: VCs for positioning in RW, min A to maneuver RW with turns (pt uses rollator at baseline so is used to swivel wheels, likely would not need assist to maneuver a rollator), no loss of balance, distance limited by fatigue ? ?Stairs ?  ?  ?  ?  ?  ? ?Wheelchair Mobility ?  ? ?Modified Rankin (Stroke Patients Only) ?  ? ?  ? ?Balance Overall balance assessment: Modified Independent ?  ?  ?  ?  ?  ?  ?  ?  ?  ?  ?  ?  ?  ?  ?  ?  ?  ?  ?   ? ? ? ?Pertinent Vitals/Pain Pain Assessment ?Pain Assessment: Faces ?Faces Pain Scale: No hurt  ? ? ?Home Living Family/patient expects to be discharged to:: Private residence ?Living Arrangements: Spouse/significant other ?Available Help at Discharge: Family;Available 24 hours/day ?Type of Home: House ?Home Access: Stairs to enter ?Entrance Stairs-Rails: Right ?Entrance Stairs-Number of Steps: 5 ?Alternate Level Stairs-Number of Steps: flight ?Home Layout: Two level;Bed/bath upstairs ?Home Equipment: Conservation officer, nature (2 wheels);Rollator (4 wheels);BSC/3in1;Shower seat;Grab bars - tub/shower;Grab bars - toilet;Wheelchair - manual;Other (comment) ?Additional Comments: No supplementary O2 at baseline  ?  ?Prior Function Prior Level of Function : Needs assist ?  ?  ?  ?  ?  ?  ?Mobility Comments: Pt mod I with rollator in home, relies on holding onto husband for support outside home. Husband assists on stairs outside. Pt uses stair lift inside with SUP.  Pt fatigues quickly. No falls in past year. ?ADLs Comments: Husband assists with shower transfers for safety but pt able to bathe self once seated on shower chair. PRN assist with LB dressing. Husband completes IADLs. Pt enjoys going shopping at [place] ?  ? ? ?Hand Dominance  ? Dominant Hand: Left ? ?  ?Extremity/Trunk Assessment  ? Upper Extremity Assessment ?Upper Extremity Assessment: Overall WFL for tasks assessed ?  ? ?Lower Extremity  Assessment ?Lower Extremity Assessment: Overall WFL for tasks assessed ?  ? ?Cervical / Trunk Assessment ?Cervical / Trunk Assessment: Normal  ?Communication  ? Communication: HOH  ?Cognition Arousal/Alertness: Awake/alert ?Behavior During Therapy: [facility] for tasks assessed/performed ?Overall Cognitive Status: Within Functional Limits for tasks assessed ?  ?  ?  ?  ?  ?  ?  ?  ?  ?  ?  ?  ?  ?  ?  ?  ?General Comments: oriented to self, month/year, location; follows commands ?  ?  ? ?  ?General Comments   ? ?  ?Exercises    ? ?Assessment/Plan  ?  ?PT Assessment Patient does not need any further PT services  ?PT Problem List   ? ?   ?  ?PT Treatment Interventions     ? ?PT Goals (Current goals can be found in the Care Plan section)  ?Acute Rehab PT Goals ?PT Goal Formulation: All assessment and education complete, DC therapy ? ?  ?Frequency   ?  ? ? ?Co-evaluation   ?  ?  ?  ?  ? ? ?  ?AM-PAC PT "6 Clicks" Mobility  ?Outcome Measure Help needed turning from your back to your side while in a flat bed without using bedrails?: None ?Help needed moving from lying on your back to sitting on the side of a flat bed without using bedrails?: None ?Help needed moving to and from a bed to a chair (including a wheelchair)?: None ?Help needed standing up from a chair using your arms (e.g., wheelchair or bedside chair)?: None ?Help needed to walk in hospital room?: None ?Help needed climbing 3-5 steps with a railing? : A Little ?6 Click Score: 23 ? ?  ?End of Session Equipment Utilized During Treatment: Gait belt ?Activity Tolerance: Patient tolerated treatment well ?Patient left: in chair;with chair alarm set;with call bell/phone within reach ?Nurse Communication: Mobility status ?  ?  ? ?Time: 1007-1219 ?PT Time Calculation (min) (ACUTE ONLY): 12 min ? ? ?Charges:   PT Evaluation ?$PT Eval Low Complexity: 1 Low ?  ?  ?   ? ?Blondell Reveal Kistler PT 10/11/2021  ?Acute Rehabilitation Services ?Pager [phone] ?Office  [phone] ? ? ?

## 2021-10-11 NOTE — ED Provider Notes (Signed)
?[facility] DEPT ?Provider Note ? ? ?CSN: [id number] ?Arrival date & time: 10/10/21  2239 ? ?  ? ?History ? ?Chief Complaint  ?Patient presents with  ? Altered Mental Status  ? ? ?Katherine [name] is a 88 y.o. female. ? ?The history is provided by the spouse and a relative. The history is limited by the condition of the patient.  ?Altered Mental Status ?Presenting symptoms: confusion and memory loss   ?Severity:  Moderate ?Most recent episode:  More than 2 days ago ?Episode history:  Multiple ?Timing:  Intermittent ?Progression:  Waxing and waning ?Chronicity:  New ?Context: recent illness   ?Context: not dementia   ?Associated symptoms: decreased appetite   ?Associated symptoms: no fever, no seizures, no slurred speech and no vomiting   ? ?  ? ?Home Medications ?Prior to Admission medications   ?Medication Sig Start Date End Date Taking? Authorizing Provider  ?albuterol (VENTOLIN HFA) 108 (90 Base) MCG/ACT inhaler INHALE 2 PUFFS INTO THE LUNGS 4 (FOUR) TIMES DAILY AS NEEDED FOR WHEEZING OR SHORTNESS OF BREATH. ?Patient taking differently: Inhale 2 puffs into the lungs every 4 (four) hours as needed for wheezing or shortness of breath. 07/10/21  Yes Byrum, Rose Fillers, MD  ?ALPRAZolam (XANAX) 0.25 MG tablet TAKE ONE TAB UP TO TWICE DAILY AS NEEDED FOR ANXIETY AND SLEEP ?Patient taking differently: Take 0.25 mg by mouth at bedtime. 06/03/21  Yes BaxleyCresenciano Lick, MD  ?aspirin 81 MG chewable tablet Chew 1 tablet (81 mg total) by mouth daily. 08/19/21  Yes Eileen Stanford, PA-C  ?budesonide (PULMICORT) 0.5 MG/2ML nebulizer solution TAKE 2 MLS (0.5 MG TOTAL) BY NEBULIZATION 2 (TWO) TIMES DAILY. ?Patient taking differently: Take 0.5 mg by nebulization daily as needed (shortness of breath). 09/23/21  Yes Clayton Bibles, NP  ?Cyanocobalamin (VITAMIN B-12 PO) Take 1 tablet by mouth daily.   Yes [provider]  ?escitalopram (LEXAPRO) 5 MG tablet TAKE 1 TABLET BY MOUTH EVERY DAY ?Patient  taking differently: Take 5 mg by mouth at bedtime. 10/01/21  Yes Baxley, Cresenciano Lick, MD  ?ipratropium (ATROVENT) 0.02 % nebulizer solution TAKE 2.5 MLS (0.5 MG TOTAL) BY NEBULIZATION 4 (FOUR) TIMES DAILY. ?Patient taking differently: Take 0.5 mg by nebulization every 6 (six) hours as needed for wheezing or shortness of breath. 02/09/21  Yes Baxley, Cresenciano Lick, MD  ?Melatonin 10 MG CAPS Take 10 mg by mouth at bedtime.   Yes [provider]  ?Multiple Vitamins-Minerals (MULTIVITAMIN WITH MINERALS) tablet Take 1 tablet by mouth daily.   Yes [provider]  ?Polysaccharide Iron Complex (POLYSACCHARIDE IRON PO) Take 1 capsule by mouth daily.   Yes [provider]  ?promethazine (PHENERGAN) 12.5 MG tablet TAKE 1 TABLET (12.5 MG TOTAL) BY MOUTH EVERY 8 (EIGHT) HOURS AS NEEDED FOR NAUSEA OR VOMITING. 09/13/21  Yes Baxley, Cresenciano Lick, MD  ?traMADol (ULTRAM) 50 MG tablet Take 50 mg by mouth every 6 (six) hours as needed for moderate pain.  07/18/19  Yes [provider]  ?traZODone (DESYREL) 50 MG tablet TAKE 3 TABLETS BY MOUTH AT BEDTIME ?Patient taking differently: Take 100 mg by mouth at bedtime. 06/21/21  Yes Baxley, Cresenciano Lick, MD  ?fluticasone (FLONASE) 50 MCG/ACT nasal spray Place 1 spray into both nostrils daily. ?Patient not taking: Reported on 10/11/2021 07/24/21   Clayton Bibles, NP  ?   ? ?Allergies    ?Patient has no known allergies.   ? ?Review of Systems   ?Review of Systems  ?  Unable to perform ROS: Mental status change  ?Constitutional:  Positive for decreased appetite. Negative for fever.  ?HENT:  Negative for facial swelling.   ?Respiratory:  Negative for wheezing and stridor.   ?Gastrointestinal:  Negative for vomiting.  ?Neurological:  Negative for seizures.  ?Psychiatric/Behavioral:  Positive for confusion and memory loss.   ? ?Physical Exam ?Updated Vital Signs ?BP 129/68   Pulse 79   Temp 97.7 ?F (36.5 ?C) (Oral)   Resp (!) 23   Ht 5' 1.5" (1.562 m)   Wt 42.6 kg   SpO2 100%   BMI  17.47 kg/m?  ?Physical Exam ?Vitals and nursing note reviewed.  ?Constitutional:   ?   General: She is not in acute distress. ?   Appearance: Normal appearance.  ?HENT:  ?   Head: Normocephalic and atraumatic.  ?   Nose: Nose normal.  ?Eyes:  ?   Conjunctiva/sclera: Conjunctivae normal.  ?   Pupils: Pupils are equal, round, and reactive to light.  ?Cardiovascular:  ?   Rate and Rhythm: Normal rate and regular rhythm.  ?   Pulses: Normal pulses.  ?   Heart sounds: Normal heart sounds.  ?Pulmonary:  ?   Effort: Pulmonary effort is normal. No respiratory distress.  ?   Breath sounds: No wheezing or rales.  ?Abdominal:  ?   General: Bowel sounds are normal.  ?   Palpations: Abdomen is soft.  ?   Tenderness: There is no abdominal tenderness. There is no guarding or rebound.  ?   Hernia: No hernia is present.  ?Musculoskeletal:     ?   General: Normal range of motion.  ?   Cervical back: Normal range of motion and neck supple.  ?Skin: ?   General: Skin is warm and dry.  ?   Capillary Refill: Capillary refill takes less than 2 seconds.  ?Neurological:  ?   General: No focal deficit present.  ?   Mental Status: She is alert.  ?   Deep Tendon Reflexes: Reflexes normal.  ?Psychiatric:     ?   Mood and Affect: Mood normal.  ? ? ?ED Results / Procedures / Treatments   ?Labs ?(all labs ordered are listed, but only abnormal results are displayed) ?Results for orders placed or performed during the hospital encounter of 10/10/21  ?Urinalysis, Routine w reflex microscopic Urine, Clean Catch  ?Result Value Ref Range  ? Color, Urine YELLOW YELLOW  ? APPearance CLEAR CLEAR  ? Specific Gravity, Urine 1.012 1.005 - 1.030  ? pH 5.0 5.0 - 8.0  ? Glucose, UA NEGATIVE NEGATIVE mg/dL  ? Hgb urine dipstick NEGATIVE NEGATIVE  ? Bilirubin Urine NEGATIVE NEGATIVE  ? Ketones, ur NEGATIVE NEGATIVE mg/dL  ? Protein, ur NEGATIVE NEGATIVE mg/dL  ? Nitrite NEGATIVE NEGATIVE  ? Leukocytes,Ua LARGE (A) NEGATIVE  ? RBC / HPF 0-5 0 - 5 RBC/hpf  ? WBC, UA  21-50 0 - 5 WBC/hpf  ? Bacteria, UA NONE SEEN NONE SEEN  ? Squamous Epithelial / LPF 0-5 0 - 5  ? Mucus PRESENT   ?Comprehensive metabolic panel  ?Result Value Ref Range  ? Sodium 141 135 - 145 mmol/L  ? Potassium 3.5 3.5 - 5.1 mmol/L  ? Chloride 109 98 - 111 mmol/L  ? CO2 26 22 - 32 mmol/L  ? Glucose, Bld 107 (H) 70 - 99 mg/dL  ? BUN 20 8 - 23 mg/dL  ? Creatinine, Ser 0.97 0.44 - 1.00 mg/dL  ? Calcium 9.3 8.9 -  10.3 mg/dL  ? Total Protein 6.5 6.5 - 8.1 g/dL  ? Albumin 3.6 3.5 - 5.0 g/dL  ? AST 33 15 - 41 U/L  ? ALT 20 0 - 44 U/L  ? Alkaline Phosphatase 53 38 - 126 U/L  ? Total Bilirubin 0.4 0.3 - 1.2 mg/dL  ? GFR, Estimated 55 (L) >60 mL/min  ? Anion gap 6 5 - 15  ?Blood gas, arterial (at [facility])  ?Result Value Ref Range  ? FIO2 21.00 %  ? pH, Arterial 7.45 7.35 - 7.45  ? pCO2 arterial 45 32 - 48 mmHg  ? pO2, Arterial 111 (H) 83 - 108 mmHg  ? Bicarbonate 31.2 (H) 20.0 - 28.0 mmol/L  ? Acid-Base Excess 6.1 (H) 0.0 - 2.0 mmol/L  ? O2 Saturation 97.1 %  ? Patient temperature 36.5   ? Collection site RIGHT RADIAL   ? Drawn by [id number]   ? Allens test (pass/fail) PASS PASS  ?CBC with Differential  ?Result Value Ref Range  ? WBC 6.0 4.0 - 10.5 K/uL  ? RBC 3.61 (L) 3.87 - 5.11 MIL/uL  ? Hemoglobin 10.5 (L) 12.0 - 15.0 g/dL  ? HCT 32.6 (L) 36.0 - 46.0 %  ? MCV 90.3 80.0 - 100.0 fL  ? MCH 29.1 26.0 - 34.0 pg  ? MCHC 32.2 30.0 - 36.0 g/dL  ? RDW 12.6 11.5 - 15.5 %  ? Platelets 157 150 - 400 K/uL  ? nRBC 0.0 0.0 - 0.2 %  ? Neutrophils Relative % 65 %  ? Neutro Abs 3.9 1.7 - 7.7 K/uL  ? Lymphocytes Relative 24 %  ? Lymphs Abs 1.4 0.7 - 4.0 K/uL  ? Monocytes Relative 7 %  ? Monocytes Absolute 0.4 0.1 - 1.0 K/uL  ? Eosinophils Relative 3 %  ? Eosinophils Absolute 0.2 0.0 - 0.5 K/uL  ? Basophils Relative 1 %  ? Basophils Absolute 0.1 0.0 - 0.1 K/uL  ? Immature Granulocytes 0 %  ? Abs Immature Granulocytes 0.02 0.00 - 0.07 K/uL  ?Troponin I (High Sensitivity)  ?Result Value Ref Range  ? Troponin I (High Sensitivity) 8 <18 ng/L   ?Troponin I (High Sensitivity)  ?Result Value Ref Range  ? Troponin I (High Sensitivity) 11 <18 ng/L  ? ?CT Head Wo Contrast ? ?Result Date: 10/10/2021 ?CLINICAL DATA:  Head trauma, abnormal mental status (Age 29-6

## 2021-10-11 NOTE — H&P (Signed)
?History and Physical  ? ? ?Patient: Katherine [name] NAT:[id number] DOB: 1928-08-08 ?DOA: 10/10/2021 ?DOS: the patient was seen and examined on 10/11/2021 ?PCP: Elby Showers, MD  ?Patient coming from: Home ? ?Chief Complaint:  ?Chief Complaint  ?Patient presents with  ? Altered Mental Status  ? ?HPI: Katherine [name] is a 88 y.o. female with medical history significant of COPD, chronic hypoxic respiratory failure, aortic stenosis s/p TAVR, anxiety/depression. Presenting with confusion. History from husband. He reports that yesterday she was confused as to his identity and her location. She didn't recognize him as her husband and didn't know whose bedroom she was in. She became belligerent. He called her granddaughter for help. However, she didn't come around to understanding who anyone was. He reports that she has had poor PO intake over the last several days. He reports that she has had occasional confusion in the past, but not anything like this. They became concerned and brought her to the ED for assistance. He denies any other aggravating or alleviating factors.  ? ?Review of Systems: unable to review all systems due to the inability of the patient to answer questions. ?Past Medical History:  ?Diagnosis Date  ? Allergy   ? Anemia   ? Arthritis   ? Asthma   ? patient denies  ? B12 deficiency   ? COPD (chronic obstructive pulmonary disease) ([place])   ? Depression   ? not recent  ? Diverticulosis   ? Esophageal stricture   ? Hepatic cyst 01/30/2010  ? Hiatal hernia 1985  ? patient unaware  ? Hx of adenomatous colonic polyps 2006  ? Hyperlipidemia   ? IBS (irritable bowel syndrome)   ? PONV (postoperative nausea and vomiting)   ? no nausea or vomitting after surgery 07/2014  ? Positive PPD   ? PUD (peptic ulcer disease)   ? S/P TAVR (transcatheter aortic valve replacement) 08/13/2021  ? s/p TAVR with a 72m Edwards S3UR via the right carotid approach by Dr. MAngelena Form& Dr. BCyndia Bent ? Severe aortic stenosis   ? ?Past  Surgical History:  ?Procedure Laterality Date  ? ABDOMINAL HYSTERECTOMY    ? abnormal bleeding  ? APPENDECTOMY    ? BARTHOLIN GLAND CYST EXCISION    ? x2  ? BREAST BIOPSY Bilateral   ? Milk glnd  ? CATARACT EXTRACTION W/ INTRAOCULAR LENS  IMPLANT, BILATERAL Bilateral   ? COLONOSCOPY    ? COLONOSCOPY W/ POLYPECTOMY    ? HARDWARE REMOVAL N/A 12/13/2015  ? Procedure: Removal of HARDWARE  Lumbar Five-Sacral One ;  Surgeon: GKary Kos MD;  [facility];  Service: Neurosurgery;  Laterality: N/A;  ? IR KYPHO LUMBAR INC FX REDUCE BONE BX UNI/BIL CANNULATION INC/IMAGING  07/13/2019  ? PERICARDIAL WINDOW  08/13/2021  ? Procedure: PERICARDIAL WINDOW;  Surgeon: MBurnell Blanks MD;  [facility]  Service: Open Heart Surgery;;  ? RIGHT/LEFT HEART CATH AND CORONARY ANGIOGRAPHY N/A 07/11/2021  ? Procedure: RIGHT/LEFT HEART CATH AND CORONARY ANGIOGRAPHY;  Surgeon: MBurnell Blanks MD;  [facility];  Service: Cardiovascular;  Laterality: N/A;  ? TEE WITHOUT CARDIOVERSION N/A 08/13/2021  ? Procedure: TRANSESOPHAGEAL ECHOCARDIOGRAM (TEE);  Surgeon: MBurnell Blanks MD;  [facility]  Service: Open Heart Surgery;  Laterality: N/A;  ? TRANSCATHETER AORTIC VALVE REPLACEMENT, CAROTID Right 08/13/2021  ? Procedure: Transcatheter Aortic Valve Replacement-Carotid;  Surgeon: MBurnell Blanks MD;  [facility] [facility]  Service: Open Heart Surgery;  Laterality: Right;  ?  UPPER GI ENDOSCOPY    ? VAGOTOMY AND PYLOROPLASTY    ? in [place], [place]  ? ?Social History:  reports that she quit smoking about 11 years ago. Her smoking use included cigarettes. She has never used smokeless tobacco. She reports that she does not drink alcohol and does not use drugs. ? ?No Known Allergies ? ?Family History  ?Problem Relation Age of Onset  ? Ulcers Father   ? Stroke Father   ? Irritable bowel syndrome Father   ? Ovarian cancer Sister   ? Colon cancer Neg Hx   ? ? ?Prior to Admission medications    ?Medication Sig Start Date End Date Taking? Authorizing Provider  ?albuterol (VENTOLIN HFA) 108 (90 Base) MCG/ACT inhaler INHALE 2 PUFFS INTO THE LUNGS 4 (FOUR) TIMES DAILY AS NEEDED FOR WHEEZING OR SHORTNESS OF BREATH. ?Patient taking differently: Inhale 2 puffs into the lungs every 4 (four) hours as needed for wheezing or shortness of breath. 07/10/21  Yes Byrum, Rose Fillers, MD  ?ALPRAZolam (XANAX) 0.25 MG tablet TAKE ONE TAB UP TO TWICE DAILY AS NEEDED FOR ANXIETY AND SLEEP ?Patient taking differently: Take 0.25 mg by mouth at bedtime. 06/03/21  Yes BaxleyCresenciano Lick, MD  ?aspirin 81 MG chewable tablet Chew 1 tablet (81 mg total) by mouth daily. 08/19/21  Yes Eileen Stanford, PA-C  ?budesonide (PULMICORT) 0.5 MG/2ML nebulizer solution TAKE 2 MLS (0.5 MG TOTAL) BY NEBULIZATION 2 (TWO) TIMES DAILY. ?Patient taking differently: Take 0.5 mg by nebulization daily as needed (shortness of breath). 09/23/21  Yes Clayton Bibles, NP  ?Cyanocobalamin (VITAMIN B-12 PO) Take 1 tablet by mouth daily.   Yes [provider]  ?escitalopram (LEXAPRO) 5 MG tablet TAKE 1 TABLET BY MOUTH EVERY DAY ?Patient taking differently: Take 5 mg by mouth at bedtime. 10/01/21  Yes Baxley, Cresenciano Lick, MD  ?ipratropium (ATROVENT) 0.02 % nebulizer solution TAKE 2.5 MLS (0.5 MG TOTAL) BY NEBULIZATION 4 (FOUR) TIMES DAILY. ?Patient taking differently: Take 0.5 mg by nebulization every 6 (six) hours as needed for wheezing or shortness of breath. 02/09/21  Yes Baxley, Cresenciano Lick, MD  ?Melatonin 10 MG CAPS Take 10 mg by mouth at bedtime.   Yes [provider]  ?Multiple Vitamins-Minerals (MULTIVITAMIN WITH MINERALS) tablet Take 1 tablet by mouth daily.   Yes [provider]  ?Polysaccharide Iron Complex (POLYSACCHARIDE IRON PO) Take 1 capsule by mouth daily.   Yes [provider]  ?promethazine (PHENERGAN) 12.5 MG tablet TAKE 1 TABLET (12.5 MG TOTAL) BY MOUTH EVERY 8 (EIGHT) HOURS AS NEEDED FOR NAUSEA OR VOMITING. 09/13/21  Yes  Baxley, Cresenciano Lick, MD  ?traMADol (ULTRAM) 50 MG tablet Take 50 mg by mouth every 6 (six) hours as needed for moderate pain.  07/18/19  Yes [provider]  ?traZODone (DESYREL) 50 MG tablet TAKE 3 TABLETS BY MOUTH AT BEDTIME ?Patient taking differently: Take 100 mg by mouth at bedtime. 06/21/21  Yes Baxley, Cresenciano Lick, MD  ?fluticasone (FLONASE) 50 MCG/ACT nasal spray Place 1 spray into both nostrils daily. ?Patient not taking: Reported on 10/11/2021 07/24/21   Clayton Bibles, NP  ? ? ?Physical Exam: ?Vitals:  ? 10/11/21 0200 10/11/21 0230 10/11/21 0330 10/11/21 0600  ?BP: (!) 143/69 129/68 140/66 (!) 142/82  ?Pulse: 80 79 81 95  ?Resp: (!) 24 (!) 23 20 (!) 30  ?Temp:      ?TempSrc:      ?SpO2: 100% 100% 100% 95%  ?Weight:      ?Height:      ? ?  General: 88 y.o. female resting in bed in NAD ?Eyes: PERRL, normal sclera ?ENMT: Nares patent w/o discharge, orophaynx clear, dentition normal, ears w/o discharge/lesions/ulcers ?Neck: Supple, trachea midline ?Cardiovascular: RRR, +S1, S2, no m/g/r, equal pulses throughout ?Respiratory: CTABL, no w/r/r, normal WOB ?GI: BS+, NDNT, no masses noted, no organomegaly noted ?MSK: No e/c/c ?Neuro: A&O x 3, no focal deficits ?Psyc: Still somewhat confused but appropriate affect, calm/cooperative ? ?Data Reviewed: ? ?Na+  141 ?Glucose  107 ?BUN 20 ?Scr 0.97 ?Trp 8 -> 11 ?WBC  6.0 ?Hgb  10.5 ?Plt 157 ?UA leukocyte positive ?ABG pO2  111 ?ABG pCO2  45 ?ABG pH  7.45 ? ?CTH:  ?1. No acute intracranial abnormality. No skull fracture. ?2. Stable atrophy and chronic small vessel ischemia. ? ?CXR: No evidence of acute cardiopulmonary disease. ? ?Assessment and Plan: ?No notes have been filed under this hospital service. ?Service: Hospitalist ?Altered mental status ?    - place in obs ?    - current Dx is UTI; started on rocephin ?    - she has had poor PO intake and labs are suggestive of dehydration; give fluids ?    - not much in the way of pain meds; not much in the way of polypharmacy ?     - she is A&O x 3 right now, but still somewhat confused ?    - delirium precautions ? ?Pyuria/UTI? ?    - follow Ucx ?    - continue rocephin for now ? ?COPD ?Chronic hypoxic respiratory failure on 2L [place] ?

## 2021-10-11 NOTE — Progress Notes (Signed)
Initial Nutrition Assessment ? ?DOCUMENTATION CODES:  ? ?Underweight ? ?INTERVENTION:  ?- Liberalize diet from a heart healthy to a regular diet to provide widest variety of menu options to enhance nutritional adequacy ? ?- Ensure Enlive po BID, each supplement provides 350 kcal and 20 grams of protein. ? ?- MVI with minerals daily ? ?NUTRITION DIAGNOSIS:  ? ?Increased nutrient needs related to acute illness as evidenced by estimated needs. ? ?GOAL:  ? ?Patient will meet greater than or equal to 90% of their needs ? ?MONITOR:  ? ?PO intake, Supplement acceptance, Diet advancement, Labs, Weight trends ? ?REASON FOR ASSESSMENT:  ? ?Consult ?Assessment of nutrition requirement/status ? ?ASSESSMENT:  ? ?Pt admiited with AMS secondary to UTI. PMH significant for COPD, chronic hypoxic respiratory failure, aortic stenosis s/p TAVR and anxiety/depression. ? ?Pt with husband at bedside. He states that she had a muffin for breakfast and planned to order a sandwich for lunch. Pt appeared frustrated and states that "she was not interested in doing this right now and does not have faith in medical staff." She expressed that she was feeling nauseous and was not going to eat. Reached out to RN with pt's concerns.  ? ?Pt reports a usual weight of 110 lbs and endorses weight loss d/t being a "light eater." Reviewed weight history. Within the last year, pt has had weight fluctuations between 41.7-44.9 kg. No significant weight loss noted. Current admit weight 42.6 kg. Will continue to monitor throughout admission.  ? ?Medications: MVI, IV rocephin ? ?Labs reviewed ? ?NUTRITION - FOCUSED PHYSICAL EXAM: ?Pt declined at this time. Deferred to follow up.  ? ?Diet Order:   ?Diet Order   ? ?       ?  Diet regular Room service appropriate? Yes; Fluid consistency: Thin  Diet effective now       ?  ? ?  ?  ? ?  ? ? ?EDUCATION NEEDS:  ? ?No education needs have been identified at this time ? ?Skin:  Skin Assessment: Reviewed RN  Assessment ? ?Last BM:  unknown ? ?Height:  ? ?Ht Readings from Last 1 Encounters:  ?10/10/21 5' 1.5" (1.562 m)  ? ? ?Weight:  ? ?Wt Readings from Last 1 Encounters:  ?10/10/21 42.6 kg  ? ?BMI:  Body mass index is 17.47 kg/m?. ? ?Estimated Nutritional Needs:  ? ?Kcal:  1300-1500 ? ?Protein:  65-80g ? ?Fluid:  >/=1.5L ? ?Clayborne Dana, RDN, LDN ?Clinical Nutrition ?

## 2021-10-11 NOTE — ED Notes (Signed)
Will transfer pt to ready bed once purple man is switched to green.  Follow up with RN at this time. Pt is resting and in no distress ?

## 2021-10-12 ENCOUNTER — Other Ambulatory Visit: Payer: Self-pay | Admitting: Nurse Practitioner

## 2021-10-12 ENCOUNTER — Other Ambulatory Visit: Payer: Self-pay | Admitting: Internal Medicine

## 2021-10-12 DIAGNOSIS — J9611 Chronic respiratory failure with hypoxia: Secondary | ICD-10-CM | POA: Diagnosis not present

## 2021-10-12 DIAGNOSIS — J310 Chronic rhinitis: Secondary | ICD-10-CM

## 2021-10-12 DIAGNOSIS — J449 Chronic obstructive pulmonary disease, unspecified: Secondary | ICD-10-CM | POA: Diagnosis not present

## 2021-10-12 DIAGNOSIS — N390 Urinary tract infection, site not specified: Secondary | ICD-10-CM | POA: Diagnosis not present

## 2021-10-12 DIAGNOSIS — R4182 Altered mental status, unspecified: Secondary | ICD-10-CM | POA: Diagnosis not present

## 2021-10-12 LAB — CBC
HCT: 29.4 % — ABNORMAL LOW (ref 36.0–46.0)
Hemoglobin: 9.4 g/dL — ABNORMAL LOW (ref 12.0–15.0)
MCH: 29.2 pg (ref 26.0–34.0)
MCHC: 32 g/dL (ref 30.0–36.0)
MCV: 91.3 fL (ref 80.0–100.0)
Platelets: 145 10*3/uL — ABNORMAL LOW (ref 150–400)
RBC: 3.22 MIL/uL — ABNORMAL LOW (ref 3.87–5.11)
RDW: 12.6 % (ref 11.5–15.5)
WBC: 4.8 10*3/uL (ref 4.0–10.5)
nRBC: 0 % (ref 0.0–0.2)

## 2021-10-12 LAB — COMPREHENSIVE METABOLIC PANEL
ALT: 15 U/L (ref 0–44)
AST: 26 U/L (ref 15–41)
Albumin: 3 g/dL — ABNORMAL LOW (ref 3.5–5.0)
Alkaline Phosphatase: 42 U/L (ref 38–126)
Anion gap: 5 (ref 5–15)
BUN: 14 mg/dL (ref 8–23)
CO2: 28 mmol/L (ref 22–32)
Calcium: 8.8 mg/dL — ABNORMAL LOW (ref 8.9–10.3)
Chloride: 109 mmol/L (ref 98–111)
Creatinine, Ser: 0.95 mg/dL (ref 0.44–1.00)
GFR, Estimated: 57 mL/min — ABNORMAL LOW (ref >60)
Glucose, Bld: 87 mg/dL (ref 70–99)
Potassium: 3.6 mmol/L (ref 3.5–5.1)
Sodium: 142 mmol/L (ref 135–145)
Total Bilirubin: 0.7 mg/dL (ref 0.3–1.2)
Total Protein: 5.5 g/dL — ABNORMAL LOW (ref 6.5–8.1)

## 2021-10-12 LAB — MAGNESIUM: Magnesium: 2.3 mg/dL (ref 1.7–2.4)

## 2021-10-12 MED ORDER — LOPERAMIDE HCL 2 MG PO CAPS
2.0000 mg | ORAL_CAPSULE | Freq: Four times a day (QID) | ORAL | 0 refills | Status: DC | PRN
Start: 2021-10-12 — End: 2022-02-04

## 2021-10-12 MED ORDER — CEPHALEXIN 500 MG PO CAPS: 500.0000 mg | ORAL_CAPSULE | Freq: Two times a day (BID) | ORAL | 0 refills | Status: AC

## 2021-10-12 MED ORDER — PANTOPRAZOLE SODIUM 40 MG PO TBEC: 40.0000 mg | DELAYED_RELEASE_TABLET | Freq: Every day | ORAL | 1 refills | Status: DC

## 2021-10-12 MED ORDER — POTASSIUM CHLORIDE CRYS ER 20 MEQ PO TBCR
40.0000 meq | EXTENDED_RELEASE_TABLET | Freq: Once | ORAL | Status: DC
  Filled 2021-10-12: qty 2

## 2021-10-12 NOTE — Discharge Summary (Signed)
Physician Discharge Summary  ?Katherine [name] HYQ:[id number] DOB: 1929-02-09 DOA: 10/10/2021 ? ?PCP: Elby Showers, MD ? ?Admit date: 10/10/2021 ?Discharge date: 10/12/2021 ? ?Admitted From: home ?Disposition:  home ? ?Recommendations for Outpatient Follow-up:  ?Follow up with PCP in 1-2 weeks ?Please obtain BMP/CBC in one week ?Urine culture in process at the time of discharge ? ?Home Health: ?Equipment/Devices: ? ?Discharge Condition: Stable ?CODE STATUS: DNR ?Diet recommendation: Heart healthy ? ?Brief/Interim Summary: ?88 year old female with a history of COPD, chronic hypoxemic respiratory failure, aortic stenosis status post TAVR, presents to the hospital with altered mental status and confusion.  She is noted to be somewhat dehydrated and urinalysis indicated possible UTI.  She was started on IV fluids and intravenous ceftriaxone.  Unfortunately, urine culture was not sent at the time of admission.  It was sent the following day, is currently pending at the time of discharge.  Clinically, the patient has significantly improved after receiving IV fluids.  Mental status has returned to baseline.  She will be transition to a course of Keflex to complete her course.  She did describe nausea and acid reflux.  She was also given a prescription for Protonix.  She did have some loose stools, and reported this was an intermittent issue for her for which she has used Imodium in the past.  Currently, I suspect that her stool may have been worsened by antibiotics.  She will use Imodium as needed at home.  Both her and her husband are eager to discharge home.  She feels that her respiratory status is currently at baseline.  She was seen by physical therapy and felt reasonable to discharge home.  She will follow-up with her primary care physician in 1 to 2 weeks. ? ?Discharge Diagnoses:  ?Principal Problem: ?  UTI (urinary tract infection) ?Active Problems: ?  COPD, severe ([place]) ?  Severe aortic stenosis ?  Depression ?  S/P  TAVR (transcatheter aortic valve replacement) ?  Chronic respiratory failure with hypoxia (HCC) ?  Anxiety ?  Altered mental status ?  Normocytic anemia ?  Protein-calorie malnutrition, moderate ([place]) ? ? ? ?Discharge Instructions ? ?Discharge Instructions   ? ? Diet - low sodium heart healthy   Complete by: As directed ?  ? Increase activity slowly   Complete by: As directed ?  ? ?  ? ?Allergies as of 10/12/2021   ?No Known Allergies ?  ? ?  ?Medication List  ?  ? ?TAKE these medications   ? ?albuterol 108 (90 Base) MCG/ACT inhaler ?Commonly known as: VENTOLIN HFA ?INHALE 2 PUFFS INTO THE LUNGS 4 (FOUR) TIMES DAILY AS NEEDED FOR WHEEZING OR SHORTNESS OF BREATH. ?What changed: See the new instructions. ?  ?ALPRAZolam 0.25 MG tablet ?Commonly known as: Duanne Moron ?TAKE ONE TAB UP TO TWICE DAILY AS NEEDED FOR ANXIETY AND SLEEP ?What changed: See the new instructions. ?  ?aspirin 81 MG chewable tablet ?Chew 1 tablet (81 mg total) by mouth daily. ?  ?budesonide 0.5 MG/2ML nebulizer solution ?Commonly known as: PULMICORT ?TAKE 2 MLS (0.5 MG TOTAL) BY NEBULIZATION 2 (TWO) TIMES DAILY. ?What changed: See the new instructions. ?  ?cephALEXin 500 MG capsule ?Commonly known as: KEFLEX ?Take 1 capsule (500 mg total) by mouth 2 (two) times daily for 3 days. ?  ?escitalopram 5 MG tablet ?Commonly known as: LEXAPRO ?TAKE 1 TABLET BY MOUTH EVERY DAY ?What changed: when to take this ?  ?fluticasone 50 MCG/ACT nasal spray ?Commonly known as: FLONASE ?Place 1 spray  into both nostrils daily. ?  ?ipratropium 0.02 % nebulizer solution ?Commonly known as: ATROVENT ?TAKE 2.5 MLS (0.5 MG TOTAL) BY NEBULIZATION 4 (FOUR) TIMES DAILY. ?What changed:  ?when to take this ?reasons to take this ?  ?loperamide 2 MG capsule ?Commonly known as: IMODIUM ?Take 1 capsule (2 mg total) by mouth 4 (four) times daily as needed for diarrhea or loose stools. ?  ?Melatonin 10 MG Caps ?Take 10 mg by mouth at bedtime. ?  ?multivitamin with minerals tablet ?Take 1  tablet by mouth daily. ?  ?pantoprazole 40 MG tablet ?Commonly known as: Protonix ?Take 1 tablet (40 mg total) by mouth daily. ?  ?POLYSACCHARIDE IRON PO ?Take 1 capsule by mouth daily. ?  ?promethazine 12.5 MG tablet ?Commonly known as: PHENERGAN ?TAKE 1 TABLET (12.5 MG TOTAL) BY MOUTH EVERY 8 (EIGHT) HOURS AS NEEDED FOR NAUSEA OR VOMITING. ?  ?traMADol 50 MG tablet ?Commonly known as: ULTRAM ?Take 50 mg by mouth every 6 (six) hours as needed for moderate pain. ?  ?traZODone 50 MG tablet ?Commonly known as: DESYREL ?TAKE 3 TABLETS BY MOUTH AT BEDTIME ?What changed: how much to take ?  ?VITAMIN B-12 PO ?Take 1 tablet by mouth daily. ?  ? ?  ? ? ?No Known Allergies ? ?Consultations: ? ? ? ?Procedures/Studies: ?CT Head Wo Contrast ? ?Result Date: 10/10/2021 ?CLINICAL DATA:  Head trauma, abnormal mental status (Age 94-64y) Intermittent confusion. EXAM: CT HEAD WITHOUT CONTRAST TECHNIQUE: Contiguous axial images were obtained from the base of the skull through the vertex without intravenous contrast. RADIATION DOSE REDUCTION: This exam was performed according to the departmental dose-optimization program which includes automated exposure control, adjustment of the mA and/or kV according to patient size and/or use of iterative reconstruction technique. COMPARISON:  Head CT and brain MRI 08/26/2019 FINDINGS: Brain: Stable degree of atrophy and chronic small vessel ischemia. No intracranial hemorrhage, mass effect, or midline shift. No hydrocephalus. The basilar cisterns are patent. No evidence of territorial infarct or acute ischemia. No extra-axial or intracranial fluid collection. Vascular: Atherosclerosis of skullbase vasculature without hyperdense vessel or abnormal calcification. Skull: No fracture or focal lesion. Sinuses/Orbits: Chronic left maxillary mucosal thickening. No sinus fluid levels. No mastoid effusion bilateral lens extraction. Other: No confluent scalp hematoma. IMPRESSION: 1. No acute intracranial  abnormality. No skull fracture. 2. Stable atrophy and chronic small vessel ischemia. Electronically Signed   By: Keith Rake M.D.   On: 10/10/2021 23:45  ? ?DG Chest Portable 1 View ? ?Result Date: 10/10/2021 ?CLINICAL DATA:  Confusion EXAM: PORTABLE CHEST 1 VIEW COMPARISON:  08/14/2021 FINDINGS: Lungs are clear.  No pleural effusion or pneumothorax. Heart is normal in size. Status post TAVR. Thoracic aortic atherosclerosis. Surgical clips at the GE junction. IMPRESSION: No evidence of acute cardiopulmonary disease. Electronically Signed   By: Julian Hy M.D.   On: 10/10/2021 23:40   ? ? ? ?Subjective: ?She is feeling better.  Husband feels that her mental status is back to baseline.  Denies any shortness of breath. ? ?Discharge Exam: ?Vitals:  ? 10/11/21 2109 10/12/21 0514 10/12/21 0717 10/12/21 1433  ?BP: (!) 144/56 (!) 130/51 (!) 122/55 111/72  ?Pulse: 84 71 75 85  ?Resp: '16 16 18 16  '$ ?Temp: 97.9 ?F (36.6 ?C) 98.1 ?F (36.7 ?C) 97.7 ?F (36.5 ?C) 98.5 ?F (36.9 ?C)  ?TempSrc: Oral Oral Oral Oral  ?SpO2: 96% 100% 100% 96%  ?Weight:      ?Height:      ? ? ?General: Pt  is alert, awake, not in acute distress ?Cardiovascular: RRR, S1/S2 +, no rubs, no gallops ?Respiratory: CTA bilaterally, no wheezing, no rhonchi ?Abdominal: Soft, NT, ND, bowel sounds + ?Extremities: no edema, no cyanosis ? ? ? ?The results of significant diagnostics from this hospitalization (including imaging, microbiology, ancillary and laboratory) are listed below for reference.   ? ? ?Microbiology: ?No results found for this or any previous visit (from the past 240 hour(s)).  ? ?Labs: ?BNP (last 3 results) ?Recent Labs  ?  08/09/21 ?1112  ?BNP 254.8*  ? ?Basic Metabolic Panel: ?Recent Labs  ?Lab 10/10/21 ?2318 10/12/21 ?0318  ?NA 141 142  ?K 3.5 3.6  ?CL 109 109  ?CO2 26 28  ?GLUCOSE 107* 87  ?BUN 20 14  ?CREATININE 0.97 0.95  ?CALCIUM 9.3 8.8*  ?MG  --  2.3  ? ?Liver Function Tests: ?Recent Labs  ?Lab 10/10/21 ?2318 10/12/21 ?0318  ?AST  33 26  ?ALT 20 15  ?ALKPHOS 53 42  ?BILITOT 0.4 0.7  ?PROT 6.5 5.5*  ?ALBUMIN 3.6 3.0*  ? ?No results for input(s): LIPASE, AMYLASE in the last 168 hours. ?No results for input(s): AMMONIA in the last 168 ho

## 2021-10-12 NOTE — Plan of Care (Signed)
?  Problem: Clinical Measurements: ?Goal: Ability to maintain clinical measurements within normal limits will improve ?Outcome: Progressing ?Goal: Will remain free from infection ?Outcome: Progressing ?Goal: Diagnostic test results will improve ?Outcome: Progressing ?  ?

## 2021-10-12 NOTE — Progress Notes (Signed)
Provided and discussed discharge instructions. Addressed all questions and concerns. IV removed intact.  ?Katherine [name] N Sheretta Grumbine ? ?

## 2021-10-12 NOTE — Progress Notes (Deleted)
Physician Discharge Summary  ?Katherine [name] OZH:[id number] DOB: 07-05-1928 DOA: 10/10/2021 ? ?PCP: Elby Showers, MD ? ?Admit date: 10/10/2021 ?Discharge date: 10/12/2021 ? ?Admitted From: home ?Disposition:  home ? ?Recommendations for Outpatient Follow-up:  ?Follow up with PCP in 1-2 weeks ?Please obtain BMP/CBC in one week ?Urine culture in process at the time of discharge ? ?Home Health: ?Equipment/Devices: ? ?Discharge Condition: Stable ?CODE STATUS: DNR ?Diet recommendation: Heart healthy ? ?Brief/Interim Summary: ?88 year old female with a history of COPD, chronic hypoxemic respiratory failure, aortic stenosis status post TAVR, presents to the hospital with altered mental status and confusion.  She is noted to be somewhat dehydrated and urinalysis indicated possible UTI.  She was started on IV fluids and intravenous ceftriaxone.  Unfortunately, urine culture was not sent at the time of admission.  It was sent the following day, is currently pending at the time of discharge.  Clinically, the patient has significantly improved after receiving IV fluids.  Mental status has returned to baseline.  She will be transition to a course of Keflex to complete her course.  She did describe nausea and acid reflux.  She was also given a prescription for Protonix.  She did have some loose stools, and reported this was an intermittent issue for her for which she has used Imodium in the past.  Currently, I suspect that her stool may have been worsened by antibiotics.  She will use Imodium as needed at home.  Both her and her husband are eager to discharge home.  She feels that her respiratory status is currently at baseline.  She was seen by physical therapy and felt reasonable to discharge home.  She will follow-up with her primary care physician in 1 to 2 weeks. ? ?Discharge Diagnoses:  ?Principal Problem: ?  UTI (urinary tract infection) ?Active Problems: ?  COPD, severe ([place]) ?  Severe aortic stenosis ?  Depression ?  S/P  TAVR (transcatheter aortic valve replacement) ?  Chronic respiratory failure with hypoxia (HCC) ?  Anxiety ?  Altered mental status ?  Normocytic anemia ?  Protein-calorie malnutrition, moderate ([place]) ? ? ? ?Discharge Instructions ? ?Discharge Instructions   ? ? Diet - low sodium heart healthy   Complete by: As directed ?  ? Increase activity slowly   Complete by: As directed ?  ? ?  ? ?Allergies as of 10/12/2021   ?No Known Allergies ?  ? ?  ?Medication List  ?  ? ?TAKE these medications   ? ?albuterol 108 (90 Base) MCG/ACT inhaler ?Commonly known as: VENTOLIN HFA ?INHALE 2 PUFFS INTO THE LUNGS 4 (FOUR) TIMES DAILY AS NEEDED FOR WHEEZING OR SHORTNESS OF BREATH. ?What changed: See the new instructions. ?  ?ALPRAZolam 0.25 MG tablet ?Commonly known as: Duanne Moron ?TAKE ONE TAB UP TO TWICE DAILY AS NEEDED FOR ANXIETY AND SLEEP ?What changed: See the new instructions. ?  ?aspirin 81 MG chewable tablet ?Chew 1 tablet (81 mg total) by mouth daily. ?  ?budesonide 0.5 MG/2ML nebulizer solution ?Commonly known as: PULMICORT ?TAKE 2 MLS (0.5 MG TOTAL) BY NEBULIZATION 2 (TWO) TIMES DAILY. ?What changed: See the new instructions. ?  ?cephALEXin 500 MG capsule ?Commonly known as: KEFLEX ?Take 1 capsule (500 mg total) by mouth 2 (two) times daily for 3 days. ?  ?escitalopram 5 MG tablet ?Commonly known as: LEXAPRO ?TAKE 1 TABLET BY MOUTH EVERY DAY ?What changed: when to take this ?  ?fluticasone 50 MCG/ACT nasal spray ?Commonly known as: FLONASE ?Place 1 spray  into both nostrils daily. ?  ?ipratropium 0.02 % nebulizer solution ?Commonly known as: ATROVENT ?TAKE 2.5 MLS (0.5 MG TOTAL) BY NEBULIZATION 4 (FOUR) TIMES DAILY. ?What changed:  ?when to take this ?reasons to take this ?  ?loperamide 2 MG capsule ?Commonly known as: IMODIUM ?Take 1 capsule (2 mg total) by mouth 4 (four) times daily as needed for diarrhea or loose stools. ?  ?Melatonin 10 MG Caps ?Take 10 mg by mouth at bedtime. ?  ?multivitamin with minerals tablet ?Take 1  tablet by mouth daily. ?  ?pantoprazole 40 MG tablet ?Commonly known as: Protonix ?Take 1 tablet (40 mg total) by mouth daily. ?  ?POLYSACCHARIDE IRON PO ?Take 1 capsule by mouth daily. ?  ?promethazine 12.5 MG tablet ?Commonly known as: PHENERGAN ?TAKE 1 TABLET (12.5 MG TOTAL) BY MOUTH EVERY 8 (EIGHT) HOURS AS NEEDED FOR NAUSEA OR VOMITING. ?  ?traMADol 50 MG tablet ?Commonly known as: ULTRAM ?Take 50 mg by mouth every 6 (six) hours as needed for moderate pain. ?  ?traZODone 50 MG tablet ?Commonly known as: DESYREL ?TAKE 3 TABLETS BY MOUTH AT BEDTIME ?What changed: how much to take ?  ?VITAMIN B-12 PO ?Take 1 tablet by mouth daily. ?  ? ?  ? ? ?No Known Allergies ? ?Consultations: ? ? ? ?Procedures/Studies: ?CT Head Wo Contrast ? ?Result Date: 10/10/2021 ?CLINICAL DATA:  Head trauma, abnormal mental status (Age 37-64y) Intermittent confusion. EXAM: CT HEAD WITHOUT CONTRAST TECHNIQUE: Contiguous axial images were obtained from the base of the skull through the vertex without intravenous contrast. RADIATION DOSE REDUCTION: This exam was performed according to the departmental dose-optimization program which includes automated exposure control, adjustment of the mA and/or kV according to patient size and/or use of iterative reconstruction technique. COMPARISON:  Head CT and brain MRI 08/26/2019 FINDINGS: Brain: Stable degree of atrophy and chronic small vessel ischemia. No intracranial hemorrhage, mass effect, or midline shift. No hydrocephalus. The basilar cisterns are patent. No evidence of territorial infarct or acute ischemia. No extra-axial or intracranial fluid collection. Vascular: Atherosclerosis of skullbase vasculature without hyperdense vessel or abnormal calcification. Skull: No fracture or focal lesion. Sinuses/Orbits: Chronic left maxillary mucosal thickening. No sinus fluid levels. No mastoid effusion bilateral lens extraction. Other: No confluent scalp hematoma. IMPRESSION: 1. No acute intracranial  abnormality. No skull fracture. 2. Stable atrophy and chronic small vessel ischemia. Electronically Signed   By: Keith Rake M.D.   On: 10/10/2021 23:45  ? ?DG Chest Portable 1 View ? ?Result Date: 10/10/2021 ?CLINICAL DATA:  Confusion EXAM: PORTABLE CHEST 1 VIEW COMPARISON:  08/14/2021 FINDINGS: Lungs are clear.  No pleural effusion or pneumothorax. Heart is normal in size. Status post TAVR. Thoracic aortic atherosclerosis. Surgical clips at the GE junction. IMPRESSION: No evidence of acute cardiopulmonary disease. Electronically Signed   By: Julian Hy M.D.   On: 10/10/2021 23:40   ? ? ? ?Subjective: ?She is feeling better.  Husband feels that her mental status is back to baseline.  Denies any shortness of breath. ? ?Discharge Exam: ?Vitals:  ? 10/11/21 2109 10/12/21 0514 10/12/21 0717 10/12/21 1433  ?BP: (!) 144/56 (!) 130/51 (!) 122/55 111/72  ?Pulse: 84 71 75 85  ?Resp: '16 16 18 16  '$ ?Temp: 97.9 ?F (36.6 ?C) 98.1 ?F (36.7 ?C) 97.7 ?F (36.5 ?C) 98.5 ?F (36.9 ?C)  ?TempSrc: Oral Oral Oral Oral  ?SpO2: 96% 100% 100% 96%  ?Weight:      ?Height:      ? ? ?General: Pt  is alert, awake, not in acute distress ?Cardiovascular: RRR, S1/S2 +, no rubs, no gallops ?Respiratory: CTA bilaterally, no wheezing, no rhonchi ?Abdominal: Soft, NT, ND, bowel sounds + ?Extremities: no edema, no cyanosis ? ? ? ?The results of significant diagnostics from this hospitalization (including imaging, microbiology, ancillary and laboratory) are listed below for reference.   ? ? ?Microbiology: ?No results found for this or any previous visit (from the past 240 hour(s)).  ? ?Labs: ?BNP (last 3 results) ?Recent Labs  ?  08/09/21 ?1112  ?BNP 254.8*  ? ?Basic Metabolic Panel: ?Recent Labs  ?Lab 10/10/21 ?2318 10/12/21 ?0318  ?NA 141 142  ?K 3.5 3.6  ?CL 109 109  ?CO2 26 28  ?GLUCOSE 107* 87  ?BUN 20 14  ?CREATININE 0.97 0.95  ?CALCIUM 9.3 8.8*  ?MG  --  2.3  ? ?Liver Function Tests: ?Recent Labs  ?Lab 10/10/21 ?2318 10/12/21 ?0318  ?AST  33 26  ?ALT 20 15  ?ALKPHOS 53 42  ?BILITOT 0.4 0.7  ?PROT 6.5 5.5*  ?ALBUMIN 3.6 3.0*  ? ?No results for input(s): LIPASE, AMYLASE in the last 168 hours. ?No results for input(s): AMMONIA in the last 168 ho

## 2021-10-12 NOTE — Evaluation (Signed)
Occupational Therapy Evaluation ?Patient Details ?Name: Katherine [name] ?MRN: [id number] ?DOB: 1929/03/20 ?Today's Date: 10/12/2021 ? ? ?History of Present Illness 88 y.o. female with medical history significant of COPD, chronic hypoxic respiratory failure, aortic stenosis s/p TAVR 08/13/21, anxiety/depression. Presenting with confusion. Dx of UTI.  ? ?Clinical Impression ?  ?Patient evaluated by Occupational Therapy with no further acute OT needs identified. All education has been completed and the patient has no further questions. Patient is supervision for ADL tasks at this time with noted HR increased to 138 bpm after toileting tasks with O2 at 91% on RA. Patient was able to calm down HR with rest seated and supplemental O2. Nurse was made aware. Patient appears to be at baseline for ADLs with patient endorsing as such.  See below for any follow-up Occupational Therapy or equipment needs. OT is signing off. Thank you for this referral. ?  ?   ? ?Recommendations for follow up therapy are one component of a multi-disciplinary discharge planning process, led by the attending physician.  Recommendations may be updated based on patient status, additional functional criteria and insurance authorization.  ? ?Follow Up Recommendations ? No OT follow up  ?  ?Assistance Recommended at Discharge Frequent or constant Supervision/Assistance  ?Patient can return home with the following A little help with walking and/or transfers;A little help with bathing/dressing/bathroom;Direct supervision/assist for financial management;Assistance with cooking/housework;Assist for transportation;Help with stairs or ramp for entrance;Direct supervision/assist for medications management ? ?  ?Functional Status Assessment ? Patient has not had a recent decline in their functional status  ?Equipment Recommendations ? None recommended by OT  ?  ?Recommendations for Other Services   ? ? ?  ?Precautions / Restrictions Precautions ?Precautions:  Fall ?Precaution Comments: monitor O2 and HR ?Restrictions ?Weight Bearing Restrictions: No  ? ?  ? ?Mobility Bed Mobility ?  ?  ?  ?  ?  ?  ?  ?General bed mobility comments: patient was in bathroom with NT at start of session and returned to recliner at end of session ?  ? ?Transfers ?  ?  ?  ?  ?  ?  ?  ?  ?  ?  ?  ? ?  ?Balance Overall balance assessment: Needs assistance ?Sitting-balance support: No upper extremity supported, Feet supported ?Sitting balance-Leahy Scale: Good ?  ?  ?Standing balance support: During functional activity, No upper extremity supported ?Standing balance-Leahy Scale: Fair ?  ?  ?  ?  ?  ?  ?  ?  ?  ?  ?  ?  ?   ? ?ADL either performed or assessed with clinical judgement  ? ?ADL Overall ADL's : At baseline ?  ?  ?  ?  ?  ?  ?  ?  ?  ?  ?  ?  ?  ?  ?  ?  ?  ?  ?  ?General ADL Comments: patient was S for ADLs with noted to have increased shortness of breath with activities toileting, standing at sink for grooming, simulated dressing, [place] for meal set up, and functional mobility in room. O2 maintained 91-92% with SOB with HR noted to increase to 138 bpm after finishing toileting tasks. patient requested for O2 at this time. with 2L/min and seated rest break patient was able to decrease HR to 89 bpm with O2 98% on 2L/min with no SOB. patient reported her breathing "gets like this sometimes". patients nurse was consulted and made aware. patient indicated that her  husband assisted her with all ADLs at home.  ? ? ? ?Vision Patient Visual Report: No change from baseline ?   ?   ?Perception   ?  ?Praxis   ?  ? ?Pertinent Vitals/Pain Pain Assessment ?Pain Assessment: Faces ?Faces Pain Scale: No hurt  ? ? ? ?Hand Dominance Left ?  ?Extremity/Trunk Assessment Upper Extremity Assessment ?Upper Extremity Assessment: Overall WFL for tasks assessed ?  ?Lower Extremity Assessment ?Lower Extremity Assessment: Defer to PT evaluation ?  ?Cervical / Trunk Assessment ?Cervical / Trunk Assessment: Normal ?   ?Communication Communication ?Communication: HOH ?  ?Cognition Arousal/Alertness: Awake/alert ?Behavior During Therapy: [facility] for tasks assessed/performed ?Overall Cognitive Status: Within Functional Limits for tasks assessed ?  ?  ?  ?  ?  ?  ?  ?  ?  ?  ?  ?  ?  ?  ?  ?  ?General Comments: patient is oriented to self, year, april, patient reported today was tuesday. able to follow commands appropirately. did need increased safety cues with mobility. ?  ?  ?General Comments  patients was noted to have increased SOB wiht functional mobiltity and standing to complete hand hygiene. patient reported needing O2. patients O2 was 91% on RA with HR of 138 bpm. patients HR was noted to drop to 112 bpm with O2 saturation 98% on 2L/min with increased time about 2 mins resting with cues for deep breathing to slow SOB. patient repoted breathing gets like this sometimes ? ?  ?Exercises   ?  ?Shoulder Instructions    ? ? ?Home Living Family/patient expects to be discharged to:: Private residence ?Living Arrangements: Spouse/significant other ?Available Help at Discharge: Family;Available 24 hours/day ?Type of Home: House ?Home Access: Stairs to enter ?Entrance Stairs-Number of Steps: 5 ?Entrance Stairs-Rails: Right;Left;Can reach both ?Home Layout: Two level;Bed/bath upstairs ?Alternate Level Stairs-Number of Steps: flight with chair lift ?  ?Bathroom Shower/Tub: Walk-in shower ?  ?Bathroom Toilet: Handicapped height ?  ?  ?Home Equipment: Conservation officer, nature (2 wheels);Rollator (4 wheels);BSC/3in1;Shower seat;Grab bars - tub/shower;Grab bars - toilet;Wheelchair - manual;Other (comment) ?  ?Additional Comments: No supplementary O2 at baseline ?  ? ?  ?Prior Functioning/Environment Prior Level of Function : Needs assist ?  ?  ?  ?  ?  ?  ?Mobility Comments: patient is MI for rollator use at home with husband S for all tasks. husband provides support for stairs ?ADLs Comments: Husband assists with shower transfers for safety but pt able  to bathe self once seated on shower chair. PRN assist with LB dressing. Husband completes IADLs. Pt enjoys going shopping at [employer]. patient reported husband is present for all ADL tasks. ?  ? ?  ?  ?OT Problem List: Decreased safety awareness;Cardiopulmonary status limiting activity ?  ?   ?OT Treatment/Interventions:    ?  ?OT Goals(Current goals can be found in the care plan section) Acute Rehab OT Goals ?OT Goal Formulation: All assessment and education complete, DC therapy  ?OT Frequency:   ?  ? ?Co-evaluation   ?  ?  ?  ?  ? ?  ?AM-PAC OT "6 Clicks" Daily Activity     ?Outcome Measure Help from another person eating meals?: None ?Help from another person taking care of personal grooming?: None ?Help from another person toileting, which includes using toliet, bedpan, or urinal?: A Little ?Help from another person bathing (including washing, rinsing, drying)?: A Little ?Help from another person to put on and taking off regular upper body  clothing?: A Little ?Help from another person to put on and taking off regular lower body clothing?: A Little ?6 Click Score: 20 ?  ?End of Session Equipment Utilized During Treatment: Rolling walker (2 wheels) ?Nurse Communication: Other (comment) (HR during session) ? ?Activity Tolerance: Patient tolerated treatment well ?Patient left: in chair;with call bell/phone within reach;with chair alarm set ? ?OT Visit Diagnosis: Unsteadiness on feet (R26.81)  ?              ?Time: 7425-9563 ?OT Time Calculation (min): 17 min ?Charges:  OT General Charges ?$OT Visit: 1 Visit ?OT Evaluation ?$OT Eval Low Complexity: 1 Low ? ?Stephaney Steven OTR/L, MS ?Acute Rehabilitation Department ?Office# [phone] ?Pager# [phone] ? ? ?Feliz Beam Raunel Dimartino ?10/12/2021, 1:00 PM ?

## 2021-10-13 LAB — URINE CULTURE: Culture: NO GROWTH

## 2021-10-14 ENCOUNTER — Telehealth: Payer: Self-pay

## 2021-10-14 NOTE — Telephone Encounter (Signed)
Transition Care Management Follow-up Telephone Call ?Date of discharge and from where: 10/12/21 ?How have you been since you were released from the hospital? Resting, doing pretty good, feeling much better ?Any questions or concerns? No ? ?Items Reviewed: ?Did the pt receive and understand the discharge instructions provided? Yes  ?Medications obtained and verified? Yes  ?Other?  N/a ?Any new allergies since your discharge? No  ?Dietary orders reviewed? Yes ?Do you have support at home? Yes  ? ?Home Care and Equipment/Supplies: ?Were home health services ordered? no ?If so, what is the name of the agency? N/a  ?Has the agency set up a time to come to the patient's home? not applicable ?Were any new equipment or medical supplies ordered?  No ?What is the name of the medical supply agency? N/a ?Were you able to get the supplies/equipment? not applicable ?Do you have any questions related to the use of the equipment or supplies? No ? ?Functional Questionnaire: (I = Independent and D = Dependent) ?ADLs: I ? ?Bathing/Dressing- I-slowly but able to do by herself ? ?Meal Prep- D ? ?Eating- I ? ?Maintaining continence- I ? ?Transferring/Ambulation- I ? ?Managing Meds- I ? ?Follow up appointments reviewed: ? ?PCP Hospital f/u appt confirmed? Yes  Scheduled to see dR BAXLEY on 10/24/21 @ 12. rEPEAT cbc/bmp ?[facility] f/u appt confirmed? No   ?Are transportation arrangements needed? No  ?If their condition worsens, is the pt aware to call PCP or go to the Emergency Dept.? Yes ?Was the patient provided with contact information for the PCP's office or ED? Yes ?Was to pt encouraged to call back with questions or concerns? Yes  ?

## 2021-10-21 ENCOUNTER — Telehealth: Payer: Self-pay | Admitting: Internal Medicine

## 2021-10-21 ENCOUNTER — Telehealth: Payer: Self-pay

## 2021-10-21 DIAGNOSIS — N390 Urinary tract infection, site not specified: Secondary | ICD-10-CM

## 2021-10-21 MED ORDER — CEPHALEXIN 500 MG PO CAPS: 500.0000 mg | ORAL_CAPSULE | Freq: Two times a day (BID) | ORAL | 0 refills | Status: DC

## 2021-10-21 NOTE — Telephone Encounter (Signed)
Husband feels pt needs a few more days of Keflex  for UTI. He does not want to bring her in today.Send in 500 mg twice daily x 4 days. Has follow up this week ?

## 2021-10-21 NOTE — Telephone Encounter (Signed)
Patients husband called asking for a refill on medication for UTI. She was better but it came back. He is asking for enough to make it to Wednesday. He did not want an appt because they have one Thursday with you. Please advise.  ?

## 2021-10-21 NOTE — Telephone Encounter (Signed)
Called to let Katherine [name] know RX was sent in. ?

## 2021-10-23 ENCOUNTER — Other Ambulatory Visit: Payer: Medicare PPO

## 2021-10-23 DIAGNOSIS — Z515 Encounter for palliative care: Secondary | ICD-10-CM

## 2021-10-23 NOTE — Progress Notes (Signed)
COMMUNITY PALLIATIVE CARE SW NOTE ? ?PATIENT NAME: Katherine [name] ?DOB: 12-25-1928 ?MRN: [id number] ? ?PRIMARY CARE PROVIDER: Elby Showers, MD ? ?RESPONSIBLE PARTY:  ?Acct ID - Guarantor Home Phone Work Phone Relationship Acct Type  ?[id number] NOEMI, BELLISSIMO* [phone]  Self P/F  ?   [address] [place], [place] [address]  ? ?SOCIAL WORK TELEPHONIC ENCOUNTER ? ?PC SW completed a telephonic encounter with patient. SW received a status update from patient's husband [name]. He advised that patient continues to show improvement after having a valve replacement. She is not ambulating independently, but requires assistance. She is currently on 2L of oxygen PRN during the day and required at night. She is receiving physical therapy 1x week. She has no in-home caregivers at this time as her husband is PCG. [name] denied any needs at this time. He asked that SW text him palliative care contact information as he feels he may reach out in a few weeks to get resources for in-home assistance. Palliative care team will follow-up in 6-8 weeks to further assess any needs.  ?SW text ed him [facility] palliative care contact information per his request.  ? ?Katheren Puller, LCSW ? ?

## 2021-10-24 ENCOUNTER — Encounter: Payer: Self-pay | Admitting: Internal Medicine

## 2021-10-24 ENCOUNTER — Other Ambulatory Visit: Payer: Medicare PPO

## 2021-10-24 ENCOUNTER — Ambulatory Visit: Payer: Medicare PPO | Admitting: Internal Medicine

## 2021-10-24 VITALS — BP 122/76 | HR 109 | Temp 97.8°F | Ht 61.5 in | Wt 95.2 lb

## 2021-10-24 DIAGNOSIS — J449 Chronic obstructive pulmonary disease, unspecified: Secondary | ICD-10-CM

## 2021-10-24 DIAGNOSIS — R531 Weakness: Secondary | ICD-10-CM

## 2021-10-24 DIAGNOSIS — Z952 Presence of prosthetic heart valve: Secondary | ICD-10-CM

## 2021-10-24 DIAGNOSIS — F419 Anxiety disorder, unspecified: Secondary | ICD-10-CM

## 2021-10-24 DIAGNOSIS — R5382 Chronic fatigue, unspecified: Secondary | ICD-10-CM | POA: Diagnosis not present

## 2021-10-24 DIAGNOSIS — D649 Anemia, unspecified: Secondary | ICD-10-CM

## 2021-10-24 DIAGNOSIS — R7989 Other specified abnormal findings of blood chemistry: Secondary | ICD-10-CM

## 2021-10-24 DIAGNOSIS — E878 Other disorders of electrolyte and fluid balance, not elsewhere classified: Secondary | ICD-10-CM

## 2021-10-24 DIAGNOSIS — F32A Depression, unspecified: Secondary | ICD-10-CM | POA: Diagnosis not present

## 2021-10-24 DIAGNOSIS — R54 Age-related physical debility: Secondary | ICD-10-CM | POA: Diagnosis not present

## 2021-10-24 NOTE — Progress Notes (Signed)
   Subjective:    Patient ID: Katherine [name], female    DOB: 16-Nov-1928, 88 y.o.   MRN: [id number]  HPI  Lab only    Review of Systems     Objective:   Physical Exam         Assessment & Plan:

## 2021-10-24 NOTE — Progress Notes (Signed)
   Subjective:    Patient ID: Katherine [name], female    DOB: 1928-10-04, 88 y.o.   MRN: [id number]  HPI 88 year old Female seen for hospitalization follow up.  She is accompanied by her husband, [name].  She is receiving palliative care services.  Presented to hospital April 27 with altered mental status and confusion.  Was felt to be volume depleted.  There was a possible UTI.  She was started on IV fluids and IV ceftriaxone.  Urine culture was not sent at the time of admission due to an error.  Was discharged on Keflex.  There were some loose bowel movements.  This might have been due to antibiotics.  She is also receiving home health services and as of late March was receiving some home physical therapy.  History of moderate aortic stenosis.  She underwent TAVR August 13, 2021.  Denies chest pain.  History of COPD.  Has home oxygen.  Hemoglobin 10.7 g and was 9.4 g in late April.  MCV is normal.  B12 and folate levels are normal.  Electrolytes are normal.  Creatinine slightly elevated at 1.14.    Review of Systems she looks frail but stable.  She is seen sitting in a wheelchair with her husband present.  Currently not tachypnea.  Says she has some back pain and feels generally weak.     Objective:   Physical Exam Blood pressure 122/76 pulse 109 regular temperature 97.8 degrees pulse oximetry 98% weight 95 pounds 4 ounces BMI 17.71-weight is about normal for her ordinarily over the past couple of years Skin warm and dry.  Chest is clear to auscultation bilaterally without rales or wheezing.  Cardiac exam: Regular rate and rhythm.  No lower extremity pitting edema.  She is alert and oriented.      Assessment & Plan:  Hospitalization follow-up for admission for altered mental status which may have been due to a UTI.  Was hospitalized April 27 through April 29.  Unfortunately culture was not sent early in admission by mistake.  She was treated with IV fluids and IV ceftriaxone.  She improved  clinically particularly after receiving IV fluids.  Mental status returned to baseline.  Unable to get urine specimen today but she looks clinically much better.  She appears to be stable today.  Palliative care services are in effect.  COPD is being treated with albuterol inhaler up to 4 times daily and budesonide nebulization treatment twice daily.  Her mild depression she is maintained on Lexapro 5 mg daily and for anxiety Xanax up to twice daily as needed.  She does have some tramadol for pain.  She also takes trazodone at bedtime which she has taken for many years.  Annual Medicare wellness physical has been scheduled for June.  Continue current medications.

## 2021-10-27 LAB — BASIC METABOLIC PANEL
BUN/Creatinine Ratio: 16 (calc) (ref 6–22)
BUN: 18 mg/dL (ref 7–25)
CO2: 31 mmol/L (ref 20–32)
Calcium: 9.7 mg/dL (ref 8.6–10.4)
Chloride: 103 mmol/L (ref 98–110)
Creat: 1.14 mg/dL — ABNORMAL HIGH (ref 0.60–0.95)
Glucose, Bld: 97 mg/dL (ref 65–99)
Potassium: 4.5 mmol/L (ref 3.5–5.3)
Sodium: 141 mmol/L (ref 135–146)

## 2021-10-27 LAB — CBC WITH DIFFERENTIAL/PLATELET
Absolute Monocytes: 490 cells/uL (ref 200–950)
Basophils Absolute: 40 cells/uL (ref 0–200)
Basophils Relative: 0.8 %
Eosinophils Absolute: 130 cells/uL (ref 15–500)
Eosinophils Relative: 2.6 %
HCT: 34 % — ABNORMAL LOW (ref 35.0–45.0)
Hemoglobin: 10.7 g/dL — ABNORMAL LOW (ref 11.7–15.5)
Lymphs Abs: 1085 cells/uL (ref 850–3900)
MCH: 28.5 pg (ref 27.0–33.0)
MCHC: 31.5 g/dL — ABNORMAL LOW (ref 32.0–36.0)
MCV: 90.4 fL (ref 80.0–100.0)
MPV: 11.1 fL (ref 7.5–12.5)
Monocytes Relative: 9.8 %
Neutro Abs: 3255 cells/uL (ref 1500–7800)
Neutrophils Relative %: 65.1 %
Platelets: 187 10*3/uL (ref 140–400)
RBC: 3.76 10*6/uL — ABNORMAL LOW (ref 3.80–5.10)
RDW: 12.8 % (ref 11.0–15.0)
Total Lymphocyte: 21.7 %
WBC: 5 10*3/uL (ref 3.8–10.8)

## 2021-10-27 LAB — B12 AND FOLATE PANEL
Folate: >24 ng/mL
Vitamin B-12: 1463 pg/mL — ABNORMAL HIGH (ref 200–1100)

## 2021-11-11 NOTE — Patient Instructions (Addendum)
Continue with home health services including palliative care.  Continue current medications.  Hemoglobin is improving.  Follow-up in June for Medicare wellness visit and health maintenance exam.

## 2021-11-24 ENCOUNTER — Other Ambulatory Visit: Payer: Self-pay | Admitting: Internal Medicine

## 2021-11-28 ENCOUNTER — Other Ambulatory Visit: Payer: Medicare PPO

## 2021-11-28 DIAGNOSIS — E878 Other disorders of electrolyte and fluid balance, not elsewhere classified: Secondary | ICD-10-CM

## 2021-11-28 DIAGNOSIS — E785 Hyperlipidemia, unspecified: Secondary | ICD-10-CM | POA: Diagnosis not present

## 2021-11-28 DIAGNOSIS — R5383 Other fatigue: Secondary | ICD-10-CM | POA: Diagnosis not present

## 2021-11-28 DIAGNOSIS — R7989 Other specified abnormal findings of blood chemistry: Secondary | ICD-10-CM

## 2021-11-29 LAB — CBC WITH DIFFERENTIAL/PLATELET
Absolute Monocytes: 434 cells/uL (ref 200–950)
Basophils Absolute: 61 cells/uL (ref 0–200)
Basophils Relative: 1.2 %
Eosinophils Absolute: 163 cells/uL (ref 15–500)
Eosinophils Relative: 3.2 %
HCT: 34.7 % — ABNORMAL LOW (ref 35.0–45.0)
Hemoglobin: 11.3 g/dL — ABNORMAL LOW (ref 11.7–15.5)
Lymphs Abs: 2004 cells/uL (ref 850–3900)
MCH: 28.7 pg (ref 27.0–33.0)
MCHC: 32.6 g/dL (ref 32.0–36.0)
MCV: 88.1 fL (ref 80.0–100.0)
MPV: 10.7 fL (ref 7.5–12.5)
Monocytes Relative: 8.5 %
Neutro Abs: 2438 cells/uL (ref 1500–7800)
Neutrophils Relative %: 47.8 %
Platelets: 168 10*3/uL (ref 140–400)
RBC: 3.94 10*6/uL (ref 3.80–5.10)
RDW: 12.9 % (ref 11.0–15.0)
Total Lymphocyte: 39.3 %
WBC: 5.1 10*3/uL (ref 3.8–10.8)

## 2021-11-29 LAB — COMPLETE METABOLIC PANEL WITH GFR
AG Ratio: 1.3 (calc) (ref 1.0–2.5)
ALT: 10 U/L (ref 6–29)
AST: 22 U/L (ref 10–35)
Albumin: 3.7 g/dL (ref 3.6–5.1)
Alkaline phosphatase (APISO): 58 U/L (ref 37–153)
BUN/Creatinine Ratio: 16 (calc) (ref 6–22)
BUN: 18 mg/dL (ref 7–25)
CO2: 25 mmol/L (ref 20–32)
Calcium: 9.7 mg/dL (ref 8.6–10.4)
Chloride: 110 mmol/L (ref 98–110)
Creat: 1.1 mg/dL — ABNORMAL HIGH (ref 0.60–0.95)
Globulin: 2.8 g/dL (calc) (ref 1.9–3.7)
Glucose, Bld: 96 mg/dL (ref 65–99)
Potassium: 4.5 mmol/L (ref 3.5–5.3)
Sodium: 147 mmol/L — ABNORMAL HIGH (ref 135–146)
Total Bilirubin: 0.3 mg/dL (ref 0.2–1.2)
Total Protein: 6.5 g/dL (ref 6.1–8.1)
eGFR: 47 mL/min/{1.73_m2} — ABNORMAL LOW (ref >=60)

## 2021-11-29 LAB — LIPID PANEL
Cholesterol: 206 mg/dL — ABNORMAL HIGH (ref <200)
HDL: 68 mg/dL (ref >=50)
LDL Cholesterol (Calc): 115 mg/dL (calc) — ABNORMAL HIGH
Non-HDL Cholesterol (Calc): 138 mg/dL (calc) — ABNORMAL HIGH (ref <130)
Total CHOL/HDL Ratio: 3 (calc) (ref <5.0)
Triglycerides: 124 mg/dL (ref <150)

## 2021-11-29 LAB — TSH: TSH: 2.88 mIU/L (ref 0.40–4.50)

## 2021-12-03 ENCOUNTER — Ambulatory Visit (INDEPENDENT_AMBULATORY_CARE_PROVIDER_SITE_OTHER): Payer: Medicare PPO | Admitting: Internal Medicine

## 2021-12-03 ENCOUNTER — Encounter: Payer: Self-pay | Admitting: Internal Medicine

## 2021-12-03 VITALS — BP 126/62 | HR 88 | Temp 98.6°F | Wt 96.0 lb

## 2021-12-03 DIAGNOSIS — M65341 Trigger finger, right ring finger: Secondary | ICD-10-CM

## 2021-12-03 DIAGNOSIS — R54 Age-related physical debility: Secondary | ICD-10-CM | POA: Diagnosis not present

## 2021-12-03 DIAGNOSIS — I700 Atherosclerosis of aorta: Secondary | ICD-10-CM

## 2021-12-03 DIAGNOSIS — D509 Iron deficiency anemia, unspecified: Secondary | ICD-10-CM

## 2021-12-03 DIAGNOSIS — F32A Depression, unspecified: Secondary | ICD-10-CM

## 2021-12-03 DIAGNOSIS — R630 Anorexia: Secondary | ICD-10-CM

## 2021-12-03 DIAGNOSIS — Z952 Presence of prosthetic heart valve: Secondary | ICD-10-CM

## 2021-12-03 DIAGNOSIS — Z681 Body mass index (BMI) 19 or less, adult: Secondary | ICD-10-CM | POA: Diagnosis not present

## 2021-12-03 DIAGNOSIS — Z0000 Encounter for general adult medical examination without abnormal findings: Secondary | ICD-10-CM | POA: Diagnosis not present

## 2021-12-03 DIAGNOSIS — Z9889 Other specified postprocedural states: Secondary | ICD-10-CM

## 2021-12-03 DIAGNOSIS — Z8711 Personal history of peptic ulcer disease: Secondary | ICD-10-CM

## 2021-12-03 DIAGNOSIS — Z87891 Personal history of nicotine dependence: Secondary | ICD-10-CM | POA: Diagnosis not present

## 2021-12-03 DIAGNOSIS — R82998 Other abnormal findings in urine: Secondary | ICD-10-CM | POA: Diagnosis not present

## 2021-12-03 DIAGNOSIS — F419 Anxiety disorder, unspecified: Secondary | ICD-10-CM

## 2021-12-03 DIAGNOSIS — J449 Chronic obstructive pulmonary disease, unspecified: Secondary | ICD-10-CM

## 2021-12-03 DIAGNOSIS — R531 Weakness: Secondary | ICD-10-CM

## 2021-12-03 DIAGNOSIS — G8929 Other chronic pain: Secondary | ICD-10-CM

## 2021-12-03 DIAGNOSIS — M549 Dorsalgia, unspecified: Secondary | ICD-10-CM | POA: Diagnosis not present

## 2021-12-03 LAB — POCT URINALYSIS DIPSTICK
Bilirubin, UA: NEGATIVE
Blood, UA: NEGATIVE
Glucose, UA: NEGATIVE
Ketones, UA: NEGATIVE
Nitrite, UA: NEGATIVE
Protein, UA: NEGATIVE
Spec Grav, UA: 1.01 (ref 1.010–1.025)
Urobilinogen, UA: 0.2 E.U./dL
pH, UA: 5 (ref 5.0–8.0)

## 2021-12-03 NOTE — Progress Notes (Signed)
Annual Wellness Visit     Patient: Katherine [name], Female    DOB: Dec 05, 1928, 88 y.o.   MRN: [id number] Visit Date: 12/03/2021  Chief Complaint  Patient presents with   Medicare Wellness   Subjective    Katherine [name] is a 88 y.o. female who presents today for her Annual Wellness Visit.  HPI She is also here for health maintenance exam and evaluation of medical issues. Palliative care reached out to patient and her husband on May 10th.  She was admitted April 27 through April 29 of this year with altered mental status and confusion.  She had large leukocytes on urine specimen.  Somehow culture was not sent at the time of admission and she was started on antibiotics and subsequent culture had no growth.  She improved after receiving IV fluids and antibiotics.  Mental status returned to baseline.  She was discharged home on Keflex.  Labs obtained in mid June are stable.  LDL is 115, total cholesterol 206.  HDL is 68 and triglycerides are normal at 124.  Creatinine stable at 1.10 hemoglobin stable at 11.3 g urine specimen on June 20 showed only trace LE.  She has a history of severe COPD and is followed by Dr. Lamonte Sakai.  History of iron deficiency and has been seen at oncology.  Received IV Venofer from August 16 through February 26 2021 x  5 doses.  Since then iron level has been stable.  In December 2022 she was evaluated for cardiac murmur after hand surgery was canceled due to anesthesia concern about her murmur.  She was found to have severely calcified aortic valve with mean gradient that increased to 33.7 mmHg with a peak gradient of 57 mmHg.  In January 2023, underwent successful TAVR with right carotid approach.  She has some generalized weakness and frailty.  She has a history of peptic ulcer disease, hyperlipidemia and depression.  She and her husband currently reside in a two-story townhome.  A number of occasions they have entertain moving to a retirement facility but  they have not done so yet.  He says they are considering [facility].  They have a motorized stair elevator that she uses to get upstairs.  For detailed past medical history please see dictation November 29, 2020.    She suffers from chronic back pain.  In February 2016 she had posterior lumbar interbody fusion at L4-L5.  In October 2016 she had transforaminal lumbar interbody fusion L3-L4 and posterior lumbar fusion L3-S1.  In 2017 she had removal of hardware at L5-S1 but has had continuous back pain ever since.  She suffered a compression fracture due to a fall at her home in January 2021.  Was diagnosed with L1 compression fracture.  Underwent kyphoplasty at L1 by radiologist in late January 2021 for pain relief.  At that time she was down to 86 pounds and had been 109 pounds in December 2020.  She was tried on Megace for appetite stimulus but this caused severe nausea.  In March 2021 she was seen in the emergency department with altered mental status and decreased appetite.  She was sitting on the commode, felt weak and fell over.  She underwent CT of the brain with contrast.  No stroke was identified.  No rib fractures detected.  She went to [facility] and was unhappy there.  It was during the pandemic and it was an awful experience for her.  She came home in April 2021  and weighed 95 pounds at the time.  In May 2021 she weighed 89 pounds and discussion was held regarding skilled nursing care for physical therapy and gait strengthening.  We [facility] but the patient did not want to go to this facility.  She remained at home and continued with physical therapy and was pleased with services.  Her appetite is poor and has been poor for some time.  She takes Lexapro and Desyrel for depression and anxiety.  Takes Xanax very sparingly 0.25 mg up to twice daily if needed.  She is on Pulmicort and albuterol inhalers.  She receives tramadol for pain  management.  She takes aspirin 81 mg daily.  Many years ago she suffered from alcoholism but has been in recovery for many years.  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  She does not abuse prescription pain medication.  She and her husband have worked with [employer] for many years.  Social history: Completed high school and 2 years of college.  2 adult daughters in good health.  Has been sober for well over 40 years.  This is her second marriage.  No children from second marriage.  She has 3 stepchildren.  Family history: Father died at age 15 with uremia.  Mother died at age 50.  58 sister died at age 44 with complications of alcoholism.  Another sister died at age 76 because of alcoholism and cancer.  Her right hand and wrist is in a brace.  She saw Dr. Burney Gauze but has not had hand surgery at this point.  Distal radius fracture in 2017.  Has pain and snapping involving the long and ring finger of her right hand.  Diagnosed by hand surgeon with acute rupture of the sagittal bands of the long and ring finger of the right hand and surgery was discussed it was postponed when it was found that she had severe aortic stenosis.   In 2014 she was found to be anemic and iron deficient.  Upper GI endoscopy showed stricture with no bleeding.  Colonoscopy was incomplete due to tortuous colon.  Anemia improved with iron therapy.  Additional past medical history: Had appendectomy 1940.  Hysterectomy with right oophorectomy for endometriosis 1967.  Vagotomy and pyloroplasty 1975.  Benign breast biopsy in 1976 and had needle guided excision of right breast calcification in 2008 by Dr. Margot Chimes.  Bartholin cyst excised twice in 1980 and 1982.  History of epidural steroid injections L5-S1 for back pain done by Dr. Niel Hummer.    Social History   Social History Narrative   Daily caffeine     Patient Care Team: Aleksa Collinsworth, Cresenciano Lick, MD as PCP - General (Internal Medicine) Burnell Blanks, MD as PCP - Cardiology (Cardiology) Collene Gobble, MD as Consulting Physician (Pulmonary Disease)  Review of Systems her appetite is fair.  Her affect is a bit sad but overall she is doing extremely well for her age and medical issues.   Objective    Vitals: Blood pressure 126/62 pulse 88 temperature 98.6 degrees pulse oximetry 98% weight 96 pounds BMI 17.85  Physical Exam Skin: Warm and dry.  No cervical adenopathy.  Chest clear.  Cardiac exam: Regular rate and rhythm.  Abdomen is soft nondistended without hepatosplenomegaly, masses or tenderness.  No lower extremity pitting edema.   Most recent functional status assessment:    12/03/2021    3:09 PM  In your present state of  health, do you have any difficulty performing the following activities:  Hearing? 0  Vision? 0  Difficulty concentrating or making decisions? 0  Walking or climbing stairs? 1  Dressing or bathing? 1  Comment P[employer]ient can do it but has a helper.  Doing errands, shopping? 1  Preparing Food and eating ? Y  Using the Toilet? Y  In the past six months, have you accidently leaked urine? N  Do you have problems with loss of bowel control? N  Managing your Medications? N  Managing your Finances? N  Housekeeping or managing your Housekeeping? N   Most recent fall risk assessment:    12/03/2021    3:09 PM  [place] in the past year? 1  Number falls in past yr: 0  Injury with Fall? 0  Risk for fall due to : History of fall(s);Impaired balance/gait;Impaired mobility  Follow up Falls evaluation completed    Most recent depression screenings:    04/04/2021    2:44 PM 02/10/2020    2:59 PM  PHQ 2/9 Scores  PHQ - 2 Score 0 3  PHQ- 9 Score  3   Most recent cognitive screening:    12/03/2021    3:10 PM  6CIT Screen  What Year? 0 points  What month? 0 points  What time? 0 points  Count back from 20 0 points  Months in reverse 0 points  Repeat phrase 10 points  Total Score 10  points    History of TAVR for aortic stenosis and doing well  COPD-wears home oxygen and is seen by Dr. Lamonte Sakai.  Uses inhalers as prescribed  Fraility-receiving Palliative care services for multiple medical issues  Anxiety and depression-treated with Xanax sparingly as well as with Desyrel which she has taken for years and will not be discontinued at this time.  She is also on Lexapro 5 mg daily.   Chronic musculoskeletal pain treated with Tramadol by [facility]  History of peptic ulcer disease-currently on Protonix 40 mg daily.  History of pyloroplasty.  History of vitamin B12 deficiency  Hyperlipidemia  Nausea treated as needed with Phenergan  History of iron deficiency-currently being treated with Niferex orally twice daily  History of esophageal stricture  Osteoarthritis  Plan: She is generally seen here every 6 months.  She will continue with Palliative care services and physical therapy at home.  She and her husband are currently doing fairly well in their townhome but have considered moving to a retirement facility.  Continue current medications and follow-up with her Specialists.  She will return here in 6 months.       Annual wellness visit done today including the all of the following: Reviewed patient's Family Medical History Reviewed and updated list of patient's medical providers Assessment of cognitive impairment was done Assessed patient's functional ability Established a written schedule for health screening [place] Completed and Reviewed  Discussed health benefits of physical activity, and encouraged her to engage in regular exercise appropriate for her age and condition.         IElby Showers, MD, have reviewed all documentation for this visit. The documentation on 01/03/22 for the exam, diagnosis, procedures, and orders are all accurate and complete.   Angus Seller, CMA

## 2021-12-04 LAB — URINE CULTURE
MICRO NUMBER:: 13548155
SPECIMEN QUALITY:: ADEQUATE

## 2021-12-19 ENCOUNTER — Telehealth: Payer: Self-pay | Admitting: Nurse Practitioner

## 2021-12-19 NOTE — Telephone Encounter (Signed)
Patient's husband, [name], called and would like a call back to discuss patient's oxygen. He states she had a valve replaced and they sent her home with oxygen and he would like to get on the same page. States there is no rush. Call back number for [name] is [phone].

## 2021-12-20 NOTE — Telephone Encounter (Signed)
Spoke with the pt's spouse  He is asking about POC order that was placed March 2023  I see the referral  Sending to [facility] per protocol

## 2021-12-21 ENCOUNTER — Other Ambulatory Visit: Payer: Self-pay | Admitting: Internal Medicine

## 2021-12-23 NOTE — Telephone Encounter (Signed)
Called [employer] & spoke to [place] - she states Melvenia Beam called pt on 3/30 and she stated she did not want poc.  I called [name] & told him this.  He states pt gets very confused & we wished they had spoke to him.  He asked if I would call [employer] and let them know she does need it.  I called back & spoke to [place].  She states ok but 30-90 day wait.  I asked if she would call & let husband know.  She states she would.  Nothing further needed.

## 2021-12-27 ENCOUNTER — Other Ambulatory Visit: Payer: Self-pay | Admitting: Internal Medicine

## 2021-12-31 DIAGNOSIS — J9610 Chronic respiratory failure, unspecified whether with hypoxia or hypercapnia: Secondary | ICD-10-CM | POA: Diagnosis not present

## 2021-12-31 DIAGNOSIS — F324 Major depressive disorder, single episode, in partial remission: Secondary | ICD-10-CM | POA: Diagnosis not present

## 2021-12-31 DIAGNOSIS — J439 Emphysema, unspecified: Secondary | ICD-10-CM | POA: Diagnosis not present

## 2021-12-31 DIAGNOSIS — J309 Allergic rhinitis, unspecified: Secondary | ICD-10-CM | POA: Diagnosis not present

## 2021-12-31 DIAGNOSIS — D509 Iron deficiency anemia, unspecified: Secondary | ICD-10-CM | POA: Diagnosis not present

## 2021-12-31 DIAGNOSIS — G4700 Insomnia, unspecified: Secondary | ICD-10-CM | POA: Diagnosis not present

## 2021-12-31 DIAGNOSIS — K219 Gastro-esophageal reflux disease without esophagitis: Secondary | ICD-10-CM | POA: Diagnosis not present

## 2021-12-31 DIAGNOSIS — F1021 Alcohol dependence, in remission: Secondary | ICD-10-CM | POA: Diagnosis not present

## 2021-12-31 DIAGNOSIS — F419 Anxiety disorder, unspecified: Secondary | ICD-10-CM | POA: Diagnosis not present

## 2022-01-01 ENCOUNTER — Ambulatory Visit: Payer: Medicare PPO | Admitting: Emergency Medicine

## 2022-01-01 ENCOUNTER — Encounter: Payer: Self-pay | Admitting: Emergency Medicine

## 2022-01-01 DIAGNOSIS — J449 Chronic obstructive pulmonary disease, unspecified: Secondary | ICD-10-CM

## 2022-01-01 DIAGNOSIS — J9611 Chronic respiratory failure with hypoxia: Secondary | ICD-10-CM | POA: Diagnosis not present

## 2022-01-01 NOTE — Assessment & Plan Note (Signed)
Since the hospitalization for her TAVR she has fallen off of her scheduled nebulizer regimen.  They do still have the medication but she has not been using on a schedule.  We will go back to Atrovent 4 times daily, budesonide twice daily.  Supplemental albuterol if needed.  Follow-up in about 6 weeks to assess improvement once the regimen is reinitiated.

## 2022-01-01 NOTE — Patient Instructions (Signed)
Please restart your ipratropium bromide nebulizer treatments 4 times a day on a schedule. Please restart your budesonide nebulizer treatments 2 times a day on a schedule. You can use your albuterol either 2 puffs or 1 nebulizer treatment up to every 4 hours if needed for shortness of breath, chest tightness, wheezing. Continue to use your oxygen at 2 L/min with exertion for now.  We may decide to recheck you in the future to see if you still qualify and require oxygen.  Pick up your portable oxygen concentrator from [employer] as planned and use 2 L/min when you exert. Follow with APP or Dr Lamonte Sakai in 6 weeks

## 2022-01-01 NOTE — Progress Notes (Signed)
Subjective:    Patient ID: Katherine [name], female    DOB: 02-May-1929, 88 y.o.   MRN: [id number]  HPI  ROV 09/19/20 --Katherine [name] is 88 and has a history of severe COPD, remote positive PPD, chronic cough with upper airway irritation syndrome in the setting of GERD, rhinitis.  She is supposed to be on scheduled DuoNebs 4 times daily, budesonide twice daily.  Uses albuterol as needed.  She has been on chronic prednisone in the past but I stop this.  They report that she has been doing fairly well. She has not been doing her nebs qid, more like 1-2x a day. She has not required any abx or pred. She is using albuterol HFA a few times a day.   She has a 6 mm right lower lobe pulmonary nodule initially found 02/07/2019, stable January 2021.  Repeat CT scan performed on 08/10/2020 reviewed by me showed no change in the nodule, new mild bandlike scarring in the apical right upper lobe of unclear significance, severe emphysema.  ROV 01/01/22 --follow-up visit for 88 year old woman whom I have followed for severe COPD, remote positive PPD, chronic cough with upper airway irritation syndrome.  I last saw her in 09/2020, last seen in our office March of this year.  She underwent TAVR for severe AS 1/49/7026 that was complicated by a pericardial effusion with tamponade requiring window, chest tube placement.  She was discharged on oxygen 2 L/min.  We have been working on trying to get her a POC.  A lot of back-and-forth with [employer] but it sounds like she is on the waiting list to receive and the orders are in.  She had been managed on ipratropium 4 times a day on a schedule, budesonide 2 times a day on a schedule, but is currently only using.  She continues to have exertional SOB walking through the house. She does not always wear the O2 w exertion.   06/22/18:  FEV1 0.94 (66) FVC 1.72 (89) Ratio 54% RV 131% TLC 106% DLCO/VA 40 % pred   Review of Systems  Constitutional:  Negative for activity change, fatigue  and unexpected weight change.  HENT:  Positive for postnasal drip and voice change.   Eyes: Negative.   Respiratory:  Positive for shortness of breath.   Cardiovascular: Negative.   Gastrointestinal: Negative.   Endocrine: Negative.   Genitourinary: Negative.   Musculoskeletal:  Negative for back pain and joint swelling.  Skin: Negative.   Allergic/Immunologic: Negative.   Neurological:  Positive for weakness.  Hematological: Negative.   Psychiatric/Behavioral: Negative.          Objective:   Physical Exam Vitals:   01/01/22 1331  BP: 110/64  Pulse: 74  Temp: 97.8 F (36.6 C)  TempSrc: Oral  SpO2: 97%  Weight: 96 lb 6.4 oz (43.7 kg)  Height: '5\' 1"'$  (1.549 m)   Gen: Pleasant, thin elderly woman, in no distress,  normal affect  ENT: No lesions,  mouth clear,  oropharynx clear, no postnasal drip  Neck: No JVD, no stridor  Lungs: No use of accessory muscles, distant, no wheeze on forced exp  Cardiovascular: RRR, holosystolic murmur  Musculoskeletal: No deformities, no cyanosis or clubbing  Neuro: alert, awake, poor memory  Skin: Warm, no lesions or rash      Assessment & Plan:  COPD, severe ([place]) Since the hospitalization for her TAVR she has fallen off of her scheduled nebulizer regimen.  They do still have the medication but she has  not been using on a schedule.  We will go back to Atrovent 4 times daily, budesonide twice daily.  Supplemental albuterol if needed.  Follow-up in about 6 weeks to assess improvement once the regimen is reinitiated.  Chronic respiratory failure with hypoxia (HCC) She went home on supplemental oxygen from her TAVR, uses intermittently.  Husband has not seen any desaturations recently even on room air.  She is supposed to get a POC from [employer] this week.  I think she should go ahead and get it, be fitted for it.  We can recheck her to see if she continues to desaturate at some point going forward.  She may be able to come off  oxygen  Katherine Apo, MD, PhD 01/01/2022, 1:48 PM [facility] [phone] or if no answer [phone]

## 2022-01-01 NOTE — Assessment & Plan Note (Signed)
She went home on supplemental oxygen from her TAVR, uses intermittently.  Husband has not seen any desaturations recently even on room air.  She is supposed to get a POC from [employer] this week.  I think she should go ahead and get it, be fitted for it.  We can recheck her to see if she continues to desaturate at some point going forward.  She may be able to come off oxygen

## 2022-01-03 ENCOUNTER — Ambulatory Visit: Payer: Medicare PPO | Admitting: Internal Medicine

## 2022-01-03 ENCOUNTER — Ambulatory Visit
Admission: RE | Admit: 2022-01-03 | Discharge: 2022-01-03 | Disposition: A | Discharge status: Home / Self Care | Payer: Medicare PPO | Source: Ambulatory Visit | Attending: Internal Medicine | Admitting: Internal Medicine

## 2022-01-03 ENCOUNTER — Encounter: Payer: Self-pay | Admitting: Internal Medicine

## 2022-01-03 VITALS — BP 136/68 | HR 86

## 2022-01-03 DIAGNOSIS — Y92009 Unspecified place in unspecified non-institutional (private) residence as the place of occurrence of the external cause: Secondary | ICD-10-CM

## 2022-01-03 DIAGNOSIS — M954 Acquired deformity of chest and rib: Secondary | ICD-10-CM | POA: Diagnosis not present

## 2022-01-03 DIAGNOSIS — J8410 Pulmonary fibrosis, unspecified: Secondary | ICD-10-CM | POA: Diagnosis not present

## 2022-01-03 DIAGNOSIS — R0789 Other chest pain: Secondary | ICD-10-CM

## 2022-01-03 DIAGNOSIS — I700 Atherosclerosis of aorta: Secondary | ICD-10-CM | POA: Diagnosis not present

## 2022-01-03 DIAGNOSIS — W19XXXA Unspecified fall, initial encounter: Secondary | ICD-10-CM | POA: Diagnosis not present

## 2022-01-03 DIAGNOSIS — R0781 Pleurodynia: Secondary | ICD-10-CM

## 2022-01-03 DIAGNOSIS — J439 Emphysema, unspecified: Secondary | ICD-10-CM | POA: Diagnosis not present

## 2022-01-03 DIAGNOSIS — Z043 Encounter for examination and observation following other accident: Secondary | ICD-10-CM | POA: Diagnosis not present

## 2022-01-03 MED ORDER — HYDROCODONE-ACETAMINOPHEN 10-325 MG PO TABS: ORAL_TABLET | ORAL | 0 refills | Status: DC

## 2022-01-03 NOTE — Patient Instructions (Addendum)
She will continue current medications.  She will continue with palliative care services and physical therapy at home.  Return in 6 months.  She should consider COVID booster and flu vaccine in the Fall.

## 2022-01-03 NOTE — Progress Notes (Signed)
   Subjective:    Patient ID: Katherine [name], female    DOB: 10-Apr-1929, 88 y.o.   MRN: [id number]  HPI 88 year old Female lives at home with her husband.  Had severe aortic stenosis and underwent TAVR February 2023.  Has decreased ambulation and wears oxygen.  History of COPD.  Apparently fell at home transferring to her reclining chair.  Chest wall struck arm of recliner.  This happened yesterday.  Has sternal pain and some right rib cage pain.  No bruises are noted.  She feels short of breath but I think that is due to pain.  Husband accompanies her today.    Review of Systems no headache, fever, chills, nausea or vomiting.     Objective:   Physical Exam She is seen in office today.  She is alert.  Able to give history.  No dysarthria.  Her chest is clear.  Blood pressure 136/68 pulse 86 and fairly regular.  Pulse oximetry is 93%.  She wears home oxygen.  She is tender over her sternum and right medial rib cage area.  No bruises appreciated.       Assessment & Plan:  Musculoskeletal pain secondary to losing balance and striking arm of recliner.  Severe COPD-wears home oxygen  Status post TAVR  Fraility  Osteoporosis  Plan: Gave her hydrocodone APAP 10/325 to take sparingly 1/2 tablet to 1 tablet by mouth every 8 hours if needed for pain.  Have x-rays of sternum and right ribs today at [facility].

## 2022-01-03 NOTE — Patient Instructions (Signed)
Have x-ray of sternum and right rib detail film.  Take hydrocodone APAP 10/325 1/2 tablet by mouth every 8 hours if needed for pain.  Patient tolerates this well.

## 2022-01-05 ENCOUNTER — Other Ambulatory Visit: Payer: Self-pay | Admitting: Internal Medicine

## 2022-01-06 ENCOUNTER — Telehealth: Payer: Self-pay | Admitting: Internal Medicine

## 2022-01-06 NOTE — Telephone Encounter (Signed)
Patients husband called and said it was a rough weekend,he said she was in a lot of pain, she mainly stayed in bed or chair beside bed. Taking 2 pain pills a day is working pretty good, he said they may need to renew at the end of the week but he will call back. Shes been in bed for most of the time since x-ray. The most she got up and moved was to and from the bathroom. He just wanted to call and update Dr Renold Genta

## 2022-01-08 ENCOUNTER — Other Ambulatory Visit: Payer: Self-pay

## 2022-01-08 ENCOUNTER — Encounter (HOSPITAL_COMMUNITY): Payer: Self-pay

## 2022-01-08 ENCOUNTER — Telehealth: Payer: Self-pay

## 2022-01-08 ENCOUNTER — Telehealth (INDEPENDENT_AMBULATORY_CARE_PROVIDER_SITE_OTHER): Payer: Medicare PPO | Admitting: Internal Medicine

## 2022-01-08 ENCOUNTER — Encounter: Payer: Self-pay | Admitting: Internal Medicine

## 2022-01-08 ENCOUNTER — Emergency Department (HOSPITAL_COMMUNITY)
Admission: EM | Admit: 2022-01-08 | Discharge: 2022-01-08 | Disposition: A | Discharge status: Home / Self Care | Payer: Medicare PPO | Attending: Emergency Medicine | Admitting: Emergency Medicine

## 2022-01-08 ENCOUNTER — Emergency Department (HOSPITAL_COMMUNITY): Payer: Medicare PPO

## 2022-01-08 DIAGNOSIS — S299XXA Unspecified injury of thorax, initial encounter: Secondary | ICD-10-CM | POA: Diagnosis present

## 2022-01-08 DIAGNOSIS — M8448XS Pathological fracture, other site, sequela: Secondary | ICD-10-CM

## 2022-01-08 DIAGNOSIS — R000 Tachycardia, unspecified: Secondary | ICD-10-CM | POA: Diagnosis not present

## 2022-01-08 DIAGNOSIS — E869 Volume depletion, unspecified: Secondary | ICD-10-CM | POA: Diagnosis not present

## 2022-01-08 DIAGNOSIS — R079 Chest pain, unspecified: Secondary | ICD-10-CM | POA: Diagnosis not present

## 2022-01-08 DIAGNOSIS — R52 Pain, unspecified: Secondary | ICD-10-CM | POA: Diagnosis not present

## 2022-01-08 DIAGNOSIS — W19XXXA Unspecified fall, initial encounter: Secondary | ICD-10-CM | POA: Insufficient documentation

## 2022-01-08 DIAGNOSIS — S2220XA Unspecified fracture of sternum, initial encounter for closed fracture: Secondary | ICD-10-CM | POA: Diagnosis not present

## 2022-01-08 DIAGNOSIS — M8000XD Age-related osteoporosis with current pathological fracture, unspecified site, subsequent encounter for fracture with routine healing: Secondary | ICD-10-CM

## 2022-01-08 DIAGNOSIS — W01190D Fall on same level from slipping, tripping and stumbling with subsequent striking against furniture, subsequent encounter: Secondary | ICD-10-CM

## 2022-01-08 DIAGNOSIS — R0789 Other chest pain: Secondary | ICD-10-CM | POA: Diagnosis not present

## 2022-01-08 DIAGNOSIS — T07XXXA Unspecified multiple injuries, initial encounter: Secondary | ICD-10-CM | POA: Diagnosis not present

## 2022-01-08 DIAGNOSIS — Z7982 Long term (current) use of aspirin: Secondary | ICD-10-CM | POA: Diagnosis not present

## 2022-01-08 DIAGNOSIS — J439 Emphysema, unspecified: Secondary | ICD-10-CM | POA: Diagnosis not present

## 2022-01-08 DIAGNOSIS — R5383 Other fatigue: Secondary | ICD-10-CM | POA: Diagnosis not present

## 2022-01-08 LAB — COMPREHENSIVE METABOLIC PANEL
ALT: 14 U/L (ref 0–44)
AST: 26 U/L (ref 15–41)
Albumin: 3 g/dL — ABNORMAL LOW (ref 3.5–5.0)
Alkaline Phosphatase: 49 U/L (ref 38–126)
Anion gap: 6 (ref 5–15)
BUN: 32 mg/dL — ABNORMAL HIGH (ref 8–23)
CO2: 29 mmol/L (ref 22–32)
Calcium: 8.8 mg/dL — ABNORMAL LOW (ref 8.9–10.3)
Chloride: 109 mmol/L (ref 98–111)
Creatinine, Ser: 1.22 mg/dL — ABNORMAL HIGH (ref 0.44–1.00)
GFR, Estimated: 42 mL/min — ABNORMAL LOW (ref >60)
Glucose, Bld: 101 mg/dL — ABNORMAL HIGH (ref 70–99)
Potassium: 3.9 mmol/L (ref 3.5–5.1)
Sodium: 144 mmol/L (ref 135–145)
Total Bilirubin: 0.3 mg/dL (ref 0.3–1.2)
Total Protein: 5.7 g/dL — ABNORMAL LOW (ref 6.5–8.1)

## 2022-01-08 LAB — CBC WITH DIFFERENTIAL/PLATELET
Abs Immature Granulocytes: 0.03 10*3/uL (ref 0.00–0.07)
Basophils Absolute: 0.1 10*3/uL (ref 0.0–0.1)
Basophils Relative: 1 %
Eosinophils Absolute: 0.2 10*3/uL (ref 0.0–0.5)
Eosinophils Relative: 3 %
HCT: 30.6 % — ABNORMAL LOW (ref 36.0–46.0)
Hemoglobin: 9.7 g/dL — ABNORMAL LOW (ref 12.0–15.0)
Immature Granulocytes: 0 %
Lymphocytes Relative: 17 %
Lymphs Abs: 1.2 10*3/uL (ref 0.7–4.0)
MCH: 29.7 pg (ref 26.0–34.0)
MCHC: 31.7 g/dL (ref 30.0–36.0)
MCV: 93.6 fL (ref 80.0–100.0)
Monocytes Absolute: 0.7 10*3/uL (ref 0.1–1.0)
Monocytes Relative: 9 %
Neutro Abs: 5.1 10*3/uL (ref 1.7–7.7)
Neutrophils Relative %: 70 %
Platelets: 171 10*3/uL (ref 150–400)
RBC: 3.27 MIL/uL — ABNORMAL LOW (ref 3.87–5.11)
RDW: 13.5 % (ref 11.5–15.5)
WBC: 7.3 10*3/uL (ref 4.0–10.5)
nRBC: 0 % (ref 0.0–0.2)

## 2022-01-08 MED ORDER — DICLOFENAC SODIUM 1 % EX GEL: 4.0000 g | Freq: Four times a day (QID) | CUTANEOUS | 0 refills | Status: DC

## 2022-01-08 MED ORDER — MORPHINE SULFATE 15 MG PO TABS: 7.5000 mg | ORAL_TABLET | ORAL | 0 refills | Status: DC | PRN

## 2022-01-08 MED ORDER — SODIUM CHLORIDE 0.9 % IV BOLUS
1000.0000 mL | Freq: Once | INTRAVENOUS | Status: AC
  Administered 2022-01-08: 1000 mL via INTRAVENOUS

## 2022-01-08 NOTE — Progress Notes (Signed)
Patient seen today via interactive audio and video telecommunications due to inability to travel with recent sternal fracture.  Patient seen with her husband present.  She is identified using 2 identifiers as Katherine [name]. Katherine [name], a longstanding patient in this practice.  She is agreeable to visit in this format today.  She is at her home and I am at my office.  Patient was seen here on July 21 after a fall at home striking the arm of reclining chair.  Patient has history of osteoporosis.  She was attempting to transfer to her reclining chair.  Chest wall struck the arm of the chair.  The accident occurred on July 20.  Patient was complaining in the office on July 21 which was the day of initial assessment of this injury of sternal pain and right rib cage pain.  No bruises were noted.  She felt short of breath but I thought that was due to pain.  She has history of TAVR and COPD.  On July 21 when seen in the office her blood pressure was stable at 136/68, pulse 86 and pulse oximetry 93%.  She wears home oxygen.  She was tender over her sternum and right medial rib cage area.  She was sent for outpatient x-rays.  It was felt that there was a deformity in the mid sternal block and likely acute sternal fracture.  Right rib detail films were negative.  She was prescribed narcotic pain medication and sent home with her husband.  Currently on Norco 10/325 1/2 to 1 tablet every 8 hours as needed for pain.  They have had some physical therapy as of April 2023 but I do not see a recent visit from them.  Husband called today and said she was still having considerable pain despite being on hydrocodone APAP 10/325 1/2-1 tab every 8 hours as needed for pain.  He felt like she was not eating well.  Her lips look dry.  She is seen lying in bed.  She looks pale.  She does recognize me and calls my name.  Says she is having considerable pain.  I am concerned she may have volume depletion and needs further evaluation in the  emergency department.  Husband advised to call EMS and have her transferred to emergency department.  She is frail, has history of COPD and is status post TAVR followed by Cardiology.  She also has mild anemia and has received iron replacement therapy in the past at Hematology.  She and her husband reside in a townhome that is [place].  They have considered moving elsewhere but have not done so.  Currently, she is in need of evaluation for volume depletion and pain management.  Please see my notes dictated July 21 and June 20 for detailed history.

## 2022-01-08 NOTE — Telephone Encounter (Signed)
Schedule video visit

## 2022-01-08 NOTE — Discharge Instructions (Signed)
Apply the gel as prescribed.  Also take tylenol '1000mg'$ (2 extra strength) four times a day.   Then take the pain medicine if you feel like you need it. Narcotics do not help with the pain, they only make you care about it less.  You can become addicted to this, people may break into your house to steal it.  It will constipate you.  If you drive under the influence of this medicine you can get a DUI.    Use the incentive spirometer 10 minutes out of every hour that you are awake.  Please return for worsening pain difficulty breathing.  Use a pillow to hold against her chest where it hurts when he take big deep breaths cough or sneeze.  Please follow-up with your family doctor.

## 2022-01-08 NOTE — ED Provider Notes (Signed)
[facility] DEPT Provider Note   CSN: [id number] Arrival date & time: 01/08/22  1411     History  Chief Complaint  Patient presents with   Chest Injury    Katherine [name] is a 88 y.o. female.  88 yo F with a chief complaints of chest pain.  The patient suffered a fall about 5 days ago and had fractured her sternum.  She has been doing well as long as she does not get up and move around or take a deep breath.  She was complaining of some significant pain.  Went and saw her family doctor again today who felt like she was dehydrated and sent here for blood work and IV fluids.  She did get her full dose of her pain medicine today, the husband said that they were bit cautious to give her much pain medicine.  She seems to be doing much better after this.  Denies cough congestion or fever.  Denies difficulty breathing.        Home Medications Prior to Admission medications   Medication Sig Start Date End Date Taking? Authorizing Provider  diclofenac Sodium (VOLTAREN) 1 % GEL Apply 4 g topically 4 (four) times daily. 01/08/22  Yes Deno Etienne, DO  morphine (MSIR) 15 MG tablet Take 0.5 tablets (7.5 mg total) by mouth every 4 (four) hours as needed for severe pain. 01/08/22  Yes Deno Etienne, DO  albuterol (VENTOLIN HFA) 108 (90 Base) MCG/ACT inhaler INHALE 2 PUFFS INTO THE LUNGS 4 (FOUR) TIMES DAILY AS NEEDED FOR WHEEZING OR SHORTNESS OF BREATH. Patient taking differently: Inhale 2 puffs into the lungs every 4 (four) hours as needed for wheezing or shortness of breath. 07/10/21   Byrum, Rose Fillers, MD  ALPRAZolam Duanne Moron) 0.25 MG tablet TAKE ONE TAB UP TO TWICE DAILY AS NEEDED FOR ANXIETY AND SLEEP 12/22/21   Elby Showers, MD  aspirin 81 MG chewable tablet Chew 1 tablet (81 mg total) by mouth daily. 08/19/21   Eileen Stanford, PA-C  budesonide (PULMICORT) 0.5 MG/2ML nebulizer solution TAKE 2 MLS (0.5 MG TOTAL) BY NEBULIZATION 2 (TWO) TIMES DAILY. 09/23/21   Cobb,  Karie Schwalbe, NP  Cyanocobalamin (VITAMIN B-12 PO) Take 1 tablet by mouth daily.    [provider]  escitalopram (LEXAPRO) 5 MG tablet TAKE 1 TABLET BY MOUTH EVERY DAY Patient taking differently: Take 5 mg by mouth at bedtime. 10/01/21   Elby Showers, MD  fluticasone (FLONASE) 50 MCG/ACT nasal spray Place 1 spray into both nostrils daily. 07/24/21   Cobb, Karie Schwalbe, NP  HYDROcodone-acetaminophen (NORCO) 10-325 MG tablet One half to one tab every 8 hours as needed for pain 01/03/22   Elby Showers, MD  ipratropium (ATROVENT) 0.02 % nebulizer solution TAKE 2.5 MLS (0.5 MG TOTAL) BY NEBULIZATION 4 (FOUR) TIMES DAILY. Patient taking differently: Take 0.5 mg by nebulization every 6 (six) hours as needed for wheezing or shortness of breath. 02/09/21   Baxley, Cresenciano Lick, MD  iron polysaccharides (NIFEREX) 150 MG capsule TAKE 1 CAPSULE BY MOUTH TWICE A DAY 01/05/22   Elby Showers, MD  loperamide (IMODIUM) 2 MG capsule Take 1 capsule (2 mg total) by mouth 4 (four) times daily as needed for diarrhea or loose stools. 10/12/21   Kathie Dike, MD  Melatonin 10 MG CAPS Take 10 mg by mouth at bedtime.    [provider]  Multiple Vitamins-Minerals (MULTIVITAMIN WITH MINERALS) tablet Take 1 tablet by mouth daily.  [provider]  pantoprazole (PROTONIX) 40 MG tablet TAKE 1 TABLET BY MOUTH EVERY DAY 10/14/21   Cobb, Karie Schwalbe, NP  promethazine (PHENERGAN) 12.5 MG tablet TAKE 1 TABLET (12.5 MG TOTAL) BY MOUTH EVERY 8 (EIGHT) HOURS AS NEEDED FOR NAUSEA OR VOMITING. 09/13/21   Elby Showers, MD  traMADol (ULTRAM) 50 MG tablet Take 50 mg by mouth every 6 (six) hours as needed for moderate pain.  07/18/19   [provider]  traZODone (DESYREL) 50 MG tablet TAKE 3 TABLETS BY MOUTH AT BEDTIME 11/24/21   Elby Showers, MD      Allergies    Patient has no known allergies.    Review of Systems   Review of Systems  Physical Exam Updated Vital Signs BP (!) 131/52   Pulse 85   Temp  98.3 F (36.8 C) (Oral)   Resp 17   Ht '5\' 1"'$  (1.549 m)   Wt 43 kg   SpO2 100%   BMI 17.91 kg/m  Physical Exam Vitals and nursing note reviewed.  Constitutional:      General: She is not in acute distress.    Appearance: She is well-developed. She is not diaphoretic.  HENT:     Head: Normocephalic and atraumatic.  Eyes:     Pupils: Pupils are equal, round, and reactive to light.  Cardiovascular:     Rate and Rhythm: Normal rate and regular rhythm.     Heart sounds: No murmur heard.    No friction rub. No gallop.  Pulmonary:     Effort: Pulmonary effort is normal.     Breath sounds: No wheezing or rales.  Chest:     Comments: Pain about the sternum. Abdominal:     General: There is no distension.     Palpations: Abdomen is soft.     Tenderness: There is no abdominal tenderness.  Musculoskeletal:        General: No tenderness.     Cervical back: Normal range of motion and neck supple.  Skin:    General: Skin is warm and dry.  Neurological:     Mental Status: She is alert and oriented to person, place, and time.  Psychiatric:        Behavior: Behavior normal.     ED Results / Procedures / Treatments   Labs (all labs ordered are listed, but only abnormal results are displayed) Labs Reviewed  CBC WITH DIFFERENTIAL/PLATELET - Abnormal; Notable for the following components:      Result Value   RBC 3.27 (*)    Hemoglobin 9.7 (*)    HCT 30.6 (*)    All other components within normal limits  COMPREHENSIVE METABOLIC PANEL - Abnormal; Notable for the following components:   Glucose, Bld 101 (*)    BUN 32 (*)    Creatinine, Ser 1.22 (*)    Calcium 8.8 (*)    Total Protein 5.7 (*)    Albumin 3.0 (*)    GFR, Estimated 42 (*)    All other components within normal limits    EKG None  Radiology DG Chest 2 View  Result Date: 01/08/2022 CLINICAL DATA:  Chest pain, fatigue EXAM: CHEST - 2 VIEW COMPARISON:  01/03/2022 FINDINGS: Heart is normal size. Aortic  atherosclerosis. Emphysema. No confluent opacities or effusions. No acute bony abnormality. Prior aortic valve repair. IMPRESSION: Emphysema. No acute cardiopulmonary disease. Electronically Signed   By: Rolm Baptise M.D.   On: 01/08/2022 15:46    Procedures Procedures  Medications Ordered in ED Medications  sodium chloride 0.9 % bolus 1,000 mL (1,000 mLs Intravenous New Bag/Given 01/08/22 1519)    ED Course/ Medical Decision Making/ A&P                           Medical Decision Making Amount and/or Complexity of Data Reviewed Labs: ordered. Radiology: ordered. ECG/medicine tests: ordered.  Risk Prescription drug management.   88 yo F with a chief complaints of pain after suffering a sternal fracture.  She was diagnosed and is currently being treated by her primary care physician.  Went to the office today with ongoing pain and there was some concern for dehydration and she was sent here for IV fluids and blood work.  Patient with some modest dehydration on lab work mild bump in her renal function.  Patient continues to feel well after taking a full dose of her pain medicine at home.  Chest x-ray independently interpreted by me without focal infiltrate or pneumothorax.  Family would like to take her home.  We will have her follow-up with her family doctor.  4:43 PM:  I have discussed the diagnosis/risks/treatment options with the patient and family.  Evaluation and diagnostic testing in the emergency department does not suggest an emergent condition requiring admission or immediate intervention beyond what has been performed at this time.  They will follow up with  PCP. We also discussed returning to the ED immediately if new or worsening sx occur. We discussed the sx which are most concerning (e.g., sudden worsening pain, fever, inability to tolerate by mouth) that necessitate immediate return. Medications administered to the patient during their visit and any new prescriptions  provided to the patient are listed below.  Medications given during this visit Medications  sodium chloride 0.9 % bolus 1,000 mL (1,000 mLs Intravenous New Bag/Given 01/08/22 1519)     The patient appears reasonably screen and/or stabilized for discharge and I doubt any other medical condition or other [facility] requiring further screening, evaluation, or treatment in the ED at this time prior to discharge.          Final Clinical Impression(s) / ED Diagnoses Final diagnoses:  Closed fracture of sternum, unspecified portion of sternum, initial encounter    Rx / DC Orders ED Discharge Orders          Ordered    morphine (MSIR) 15 MG tablet  Every 4 hours PRN        01/08/22 1641    diclofenac Sodium (VOLTAREN) 1 % GEL  4 times daily        01/08/22 [place], Juaquin Ludington, DO 01/08/22 1643

## 2022-01-08 NOTE — Patient Instructions (Signed)
Advise transfer via EMS to emergency department for evaluation of pain, sternal fracture and volume depletion.  Patient may need social worker to intervene regarding placement to nursing facility.

## 2022-01-08 NOTE — ED Triage Notes (Signed)
Pt BIB EMS from home c/o chest pain. Fall 5 days ago reports fractured sternum. Pt pcp req pt to come get sternum re-evaluated.

## 2022-01-08 NOTE — Telephone Encounter (Signed)
Patients husband called with update on the patient. The patient seems to still be in pain. She has been in bed 24/7 for the past week. He is unable to see if there is any improvement. H wants to know if you think inpatient rehab for a week or two would help her. A place like [facility]? Please advise

## 2022-01-09 ENCOUNTER — Other Ambulatory Visit: Payer: Self-pay | Admitting: Internal Medicine

## 2022-01-09 NOTE — Progress Notes (Signed)
Sorry to hear that. Thanks for the update. Katie

## 2022-01-09 NOTE — Telephone Encounter (Signed)
[name] [phone]  [name] called to see if you would consider given Katherine [name] a refill on her pain medication for the weekend. They are very careful not to use to much.

## 2022-01-10 MED ORDER — HYDROCODONE-ACETAMINOPHEN 10-325 MG PO TABS: ORAL_TABLET | ORAL | 0 refills | Status: DC

## 2022-01-13 ENCOUNTER — Ambulatory Visit: Payer: Medicare PPO | Admitting: Cardiovascular Disease

## 2022-01-15 ENCOUNTER — Ambulatory Visit: Payer: Medicare PPO | Admitting: Cardiovascular Disease

## 2022-01-15 NOTE — Progress Notes (Deleted)
No chief complaint on file.  History of Present Illness: 88 yo female with history of prior etoh abuse, anemia, arthritis, COPD, former tobacco abuse (stopped smoking in 2011), hyperlipidemia, IBS and severe aortic stenosis s/p TAVR in February 2023 who is here today for follow up. Echo 05/21/21 with LVEF=60-65%, mild LVH. Degenerative mitral valve with mild mitral stenosis, trivial MR, moderate MAC. Severe aortic stenosis. Cardiac cath January 2023 with mild CAD. She underwent TAVR on 08/13/21 by the right carotid artery approach with placement of 23 mm Edwards Sapien 3 Ultra THV. This was complicated by a pericardial effusion, likely from pacing wire perforation. Emergent pericardial window. Echo 09/06/21 with LVEF=60-65%. Normally functioning AVR.   She is here today for follow up. The patient denies any chest pain, dyspnea, palpitations, lower extremity edema, orthopnea, PND, dizziness, near syncope or syncope.   Primary Care Physician: Elby Showers, MD  Past Medical History:  Diagnosis Date   Allergy    Anemia    Arthritis    Asthma    patient denies   B12 deficiency    COPD (chronic obstructive pulmonary disease) ([place])    Depression    not recent   Diverticulosis    Esophageal stricture    Hepatic cyst 01/30/2010   Hiatal hernia 1985   patient unaware   Hx of adenomatous colonic polyps 2006   Hyperlipidemia    IBS (irritable bowel syndrome)    PONV (postoperative nausea and vomiting)    no nausea or vomitting after surgery 07/2014   Positive PPD    PUD (peptic ulcer disease)    S/P TAVR (transcatheter aortic valve replacement) 08/13/2021   s/p TAVR with a 49m Edwards S3UR via the right carotid approach by Dr. MAngelena Form& Dr. BCyndia Bent  Severe aortic stenosis     Past Surgical History:  Procedure Laterality Date   ABDOMINAL HYSTERECTOMY     abnormal bleeding   APPENDECTOMY     BARTHOLIN GLAND CYST EXCISION     x2   BREAST BIOPSY Bilateral    Milk glnd   CATARACT  EXTRACTION W/ INTRAOCULAR LENS  IMPLANT, BILATERAL Bilateral    COLONOSCOPY     COLONOSCOPY W/ POLYPECTOMY     HARDWARE REMOVAL N/A 12/13/2015   Procedure: Removal of HARDWARE  Lumbar Five-Sacral One ;  Surgeon: GKary Kos MD;  Location: [facility];  Service: Neurosurgery;  Laterality: N/A;   IR KYPHO LUMBAR INC FX REDUCE BONE BX UNI/BIL CANNULATION INC/IMAGING  07/13/2019   PERICARDIAL WINDOW  08/13/2021   Procedure: PERICARDIAL WINDOW;  Surgeon: MBurnell Blanks MD;  [facility]  Service: Open Heart Surgery;;   RIGHT/LEFT HEART CATH AND CORONARY ANGIOGRAPHY N/A 07/11/2021   Procedure: RIGHT/LEFT HEART CATH AND CORONARY ANGIOGRAPHY;  Surgeon: MBurnell Blanks MD;  [facility];  Service: Cardiovascular;  Laterality: N/A;   TEE WITHOUT CARDIOVERSION N/A 08/13/2021   Procedure: TRANSESOPHAGEAL ECHOCARDIOGRAM (TEE);  Surgeon: MBurnell Blanks MD;  Location: [facility]  Service: Open Heart Surgery;  Laterality: N/A;   TRANSCATHETER AORTIC VALVE REPLACEMENT, CAROTID Right 08/13/2021   Procedure: Transcatheter Aortic Valve Replacement-Carotid;  Surgeon: MBurnell Blanks MD;  [facility]  Service: Open Heart Surgery;  Laterality: Right;   UPPER GI ENDOSCOPY     VAGOTOMY AND PYLOROPLASTY     in [place], [place]    Current Outpatient Medications  Medication Sig Dispense Refill   albuterol (VENTOLIN HFA) 108 (90 Base) MCG/ACT inhaler INHALE 2  PUFFS INTO THE LUNGS 4 (FOUR) TIMES DAILY AS NEEDED FOR WHEEZING OR SHORTNESS OF BREATH. (Patient taking differently: Inhale 2 puffs into the lungs every 4 (four) hours as needed for wheezing or shortness of breath.) 36 each 6   ALPRAZolam (XANAX) 0.25 MG tablet TAKE ONE TAB UP TO TWICE DAILY AS NEEDED FOR ANXIETY AND SLEEP 60 tablet 2   aspirin 81 MG chewable tablet Chew 1 tablet (81 mg total) by mouth daily.     budesonide (PULMICORT) 0.5 MG/2ML nebulizer solution TAKE 2 MLS (0.5 MG TOTAL) BY NEBULIZATION 2  (TWO) TIMES DAILY. 120 mL 5   Cyanocobalamin (VITAMIN B-12 PO) Take 1 tablet by mouth daily.     diclofenac Sodium (VOLTAREN) 1 % GEL Apply 4 g topically 4 (four) times daily. 100 g 0   escitalopram (LEXAPRO) 5 MG tablet TAKE 1 TABLET BY MOUTH EVERY DAY (Patient taking differently: Take 5 mg by mouth at bedtime.) 90 tablet 2   fluticasone (FLONASE) 50 MCG/ACT nasal spray Place 1 spray into both nostrils daily. 18.2 mL 2   HYDROcodone-acetaminophen (NORCO) 10-325 MG tablet One half to one tab every 8 hours as needed for pain 15 tablet 0   ipratropium (ATROVENT) 0.02 % nebulizer solution TAKE 2.5 MLS (0.5 MG TOTAL) BY NEBULIZATION 4 (FOUR) TIMES DAILY. (Patient taking differently: Take 0.5 mg by nebulization every 6 (six) hours as needed for wheezing or shortness of breath.) 62.5 mL 12   iron polysaccharides (NIFEREX) 150 MG capsule TAKE 1 CAPSULE BY MOUTH TWICE A DAY 180 capsule 1   loperamide (IMODIUM) 2 MG capsule Take 1 capsule (2 mg total) by mouth 4 (four) times daily as needed for diarrhea or loose stools. 20 capsule 0   Melatonin 10 MG CAPS Take 10 mg by mouth at bedtime.     morphine (MSIR) 15 MG tablet Take 0.5 tablets (7.5 mg total) by mouth every 4 (four) hours as needed for severe pain. 4 tablet 0   Multiple Vitamins-Minerals (MULTIVITAMIN WITH MINERALS) tablet Take 1 tablet by mouth daily.     pantoprazole (PROTONIX) 40 MG tablet TAKE 1 TABLET BY MOUTH EVERY DAY 90 tablet 3   promethazine (PHENERGAN) 12.5 MG tablet TAKE 1 TABLET (12.5 MG TOTAL) BY MOUTH EVERY 8 (EIGHT) HOURS AS NEEDED FOR NAUSEA OR VOMITING. 30 tablet 3   traMADol (ULTRAM) 50 MG tablet Take 50 mg by mouth every 6 (six) hours as needed for moderate pain.      traZODone (DESYREL) 50 MG tablet TAKE 3 TABLETS BY MOUTH AT BEDTIME 270 tablet 0   No current facility-administered medications for this visit.    No Known Allergies   Social History   Socioeconomic History   Marital status: Married    Spouse name: Not on  file   Number of children: 2   Years of education: Not on file   Highest education level: Not on file  Occupational History    Employer: RETIRED   Occupation: Retired-office work  Tobacco Use   Smoking status: Former    Years: 60.00    Types: Cigarettes    Quit date: 02/02/2010    Years since quitting: 11.9   Smokeless tobacco: Never  Vaping Use   Vaping Use: Never used  Substance and Sexual Activity   Alcohol use: No    Comment: quit in 1983   Drug use: No   Sexual activity: Not on file  Other Topics Concern   Not on file  Social History Narrative  Daily caffeine    Social Determinants of Health   Financial Resource Strain: Not on file  Food Insecurity: Not on file  Transportation Needs: Not on file  Physical Activity: Not on file  Stress: Not on file  Social Connections: Not on file  Intimate Partner Violence: Not on file    Family History  Problem Relation Age of Onset   Ulcers Father    Stroke Father    Irritable bowel syndrome Father    Ovarian cancer Sister    Colon cancer Neg Hx     Review of Systems:  As stated in the HPI and otherwise negative.   There were no vitals taken for this visit.  Physical Examination: General: Well developed, well nourished, NAD  HEENT: OP clear, mucus membranes moist  SKIN: warm, dry. No rashes. Neuro: No focal deficits  Musculoskeletal: Muscle strength 5/5 all ext  Psychiatric: Mood and affect normal  Neck: No JVD, no carotid bruits, no thyromegaly, no lymphadenopathy.  Lungs:Clear bilaterally, no wheezes, rhonci, crackles Cardiovascular: Regular rate and rhythm. No murmurs, gallops or rubs. Abdomen:Soft. Bowel sounds present. Non-tender.  Extremities: No lower extremity edema. Pulses are 2 + in the bilateral DP/PT.  EKG:  EKG is *** ordered today. The ekg ordered today demonstrates   Echo 09/06/21:   1. Left ventricular ejection fraction, by estimation, is 60 to 65%. The  left ventricle has normal function. The  left ventricle has no regional  wall motion abnormalities. There is mild left ventricular hypertrophy.  Left ventricular diastolic parameters  are consistent with Grade I diastolic dysfunction (impaired relaxation).   2. Right ventricular systolic function is normal. The right ventricular  size is normal. Tricuspid regurgitation signal is inadequate for assessing  PA pressure.   3. The mitral valve is grossly normal. No evidence of mitral valve  regurgitation.   4. S/p 23 mm Edwards Sapien Valve. EOA >1 cm2. AT ~ 80 ms. DI 0.59.  Aortic V max 1.89 m/s. No paravalvular leak. Normal prosthesis. Placed  08/13/2021. Aortic valve regurgitation is not visualized.   5. The inferior vena cava is normal in size with greater than 50%  respiratory variability, suggesting right atrial pressure of 3 mmHg.   Recent Labs: 08/09/2021: B Natriuretic Peptide 254.8 10/12/2021: Magnesium 2.3 11/28/2021: TSH 2.88 01/08/2022: ALT 14; BUN 32; Creatinine, Ser 1.22; Hemoglobin 9.7; Platelets 171; Potassium 3.9; Sodium 144   Lipid Panel    Component Value Date/Time   CHOL 206 (H) 11/28/2021 1106   TRIG 124 11/28/2021 1106   HDL 68 11/28/2021 1106   CHOLHDL 3.0 11/28/2021 1106   VLDL 17 04/08/2016 1059   LDLCALC 115 (H) 11/28/2021 1106     Wt Readings from Last 3 Encounters:  01/08/22 94 lb 12.8 oz (43 kg)  01/01/22 96 lb 6.4 oz (43.7 kg)  12/03/21 96 lb (43.5 kg)     Assessment and Plan:   1. Severe Aortic Valve Stenosis s/p TAVR: She is now s/p TAVR in February 2023 and doing well. Continue ASA.   2. Pericardial effusion: s/p pericardial window  3. HTN: BP is stable. No changes  4. COPD: ***   Labs/ tests ordered today include:   No orders of the defined types were placed in this encounter.    Disposition:   F/U with me in ***   Signed, Lauree Chandler, MD 01/15/2022 7:20 AM    [address], [place], [place]  [address] Phone: [phone]; Fax:  [phone]   

## 2022-01-20 ENCOUNTER — Telehealth: Payer: Self-pay | Admitting: Internal Medicine

## 2022-01-20 ENCOUNTER — Encounter: Payer: Self-pay | Admitting: Internal Medicine

## 2022-01-20 DIAGNOSIS — M961 Postlaminectomy syndrome, not elsewhere classified: Secondary | ICD-10-CM | POA: Diagnosis not present

## 2022-01-20 DIAGNOSIS — M4316 Spondylolisthesis, lumbar region: Secondary | ICD-10-CM | POA: Diagnosis not present

## 2022-01-20 DIAGNOSIS — M47818 Spondylosis without myelopathy or radiculopathy, sacral and sacrococcygeal region: Secondary | ICD-10-CM | POA: Diagnosis not present

## 2022-01-20 DIAGNOSIS — R079 Chest pain, unspecified: Secondary | ICD-10-CM | POA: Diagnosis not present

## 2022-01-20 NOTE — Telephone Encounter (Signed)
I spoke with her husband. They saw Dr. Hardin Negus at pain management today. He has suggested increasing Tramadol to 3 tabs daily. We will hold off of any refills on Hydrocodone for now. MJB, MD

## 2022-01-20 NOTE — Telephone Encounter (Signed)
Daria Pastures called to say he was returning nurses call, that she was calling to check and see how [name] was doing.

## 2022-01-20 NOTE — Telephone Encounter (Signed)
Spoke with husband and he stated patient is making progress and he can tell she is getting better slowly. Husband stated she is walking with his assistance and can care for herself when using the toilet and showering.  Patient has been complaining of chest pain and using ice to get some relief.  The husband asked if a refill of HYDROcodone-acetaminophen (NORCO) 10-325 MG tablet could be sent to the [facility]

## 2022-01-29 ENCOUNTER — Emergency Department (HOSPITAL_COMMUNITY): Payer: Medicare PPO

## 2022-01-29 ENCOUNTER — Encounter (HOSPITAL_COMMUNITY): Payer: Self-pay

## 2022-01-29 ENCOUNTER — Inpatient Hospital Stay (HOSPITAL_COMMUNITY)
Admission: EM | Admit: 2022-01-29 | Discharge: 2022-02-04 | DRG: 564 | Disposition: A | Discharge status: Skilled Nursing Facility | Payer: Medicare PPO | Attending: Internal Medicine | Admitting: Internal Medicine

## 2022-01-29 ENCOUNTER — Other Ambulatory Visit: Payer: Self-pay

## 2022-01-29 DIAGNOSIS — G9341 Metabolic encephalopathy: Secondary | ICD-10-CM | POA: Diagnosis not present

## 2022-01-29 DIAGNOSIS — Z952 Presence of prosthetic heart valve: Secondary | ICD-10-CM | POA: Diagnosis not present

## 2022-01-29 DIAGNOSIS — S2222XA Fracture of body of sternum, initial encounter for closed fracture: Secondary | ICD-10-CM | POA: Diagnosis present

## 2022-01-29 DIAGNOSIS — S2249XA Multiple fractures of ribs, unspecified side, initial encounter for closed fracture: Secondary | ICD-10-CM

## 2022-01-29 DIAGNOSIS — N390 Urinary tract infection, site not specified: Secondary | ICD-10-CM | POA: Diagnosis present

## 2022-01-29 DIAGNOSIS — E785 Hyperlipidemia, unspecified: Secondary | ICD-10-CM | POA: Diagnosis present

## 2022-01-29 DIAGNOSIS — E876 Hypokalemia: Secondary | ICD-10-CM | POA: Diagnosis present

## 2022-01-29 DIAGNOSIS — S2241XA Multiple fractures of ribs, right side, initial encounter for closed fracture: Secondary | ICD-10-CM | POA: Diagnosis not present

## 2022-01-29 DIAGNOSIS — S22000A Wedge compression fracture of unspecified thoracic vertebra, initial encounter for closed fracture: Secondary | ICD-10-CM

## 2022-01-29 DIAGNOSIS — R110 Nausea: Secondary | ICD-10-CM | POA: Diagnosis not present

## 2022-01-29 DIAGNOSIS — R9431 Abnormal electrocardiogram [ECG] [EKG]: Secondary | ICD-10-CM | POA: Diagnosis present

## 2022-01-29 DIAGNOSIS — E46 Unspecified protein-calorie malnutrition: Secondary | ICD-10-CM | POA: Diagnosis present

## 2022-01-29 DIAGNOSIS — Z7982 Long term (current) use of aspirin: Secondary | ICD-10-CM

## 2022-01-29 DIAGNOSIS — Z681 Body mass index (BMI) 19 or less, adult: Secondary | ICD-10-CM | POA: Diagnosis not present

## 2022-01-29 DIAGNOSIS — K5900 Constipation, unspecified: Secondary | ICD-10-CM | POA: Diagnosis present

## 2022-01-29 DIAGNOSIS — R911 Solitary pulmonary nodule: Secondary | ICD-10-CM | POA: Diagnosis not present

## 2022-01-29 DIAGNOSIS — R296 Repeated falls: Secondary | ICD-10-CM | POA: Diagnosis present

## 2022-01-29 DIAGNOSIS — W1839XA Other fall on same level, initial encounter: Secondary | ICD-10-CM | POA: Diagnosis present

## 2022-01-29 DIAGNOSIS — I959 Hypotension, unspecified: Secondary | ICD-10-CM | POA: Diagnosis not present

## 2022-01-29 DIAGNOSIS — Z9981 Dependence on supplemental oxygen: Secondary | ICD-10-CM | POA: Diagnosis not present

## 2022-01-29 DIAGNOSIS — E278 Other specified disorders of adrenal gland: Secondary | ICD-10-CM | POA: Diagnosis not present

## 2022-01-29 DIAGNOSIS — Y92009 Unspecified place in unspecified non-institutional (private) residence as the place of occurrence of the external cause: Secondary | ICD-10-CM | POA: Diagnosis not present

## 2022-01-29 DIAGNOSIS — R41841 Cognitive communication deficit: Secondary | ICD-10-CM | POA: Diagnosis not present

## 2022-01-29 DIAGNOSIS — T1490XA Injury, unspecified, initial encounter: Secondary | ICD-10-CM | POA: Diagnosis not present

## 2022-01-29 DIAGNOSIS — R262 Difficulty in walking, not elsewhere classified: Secondary | ICD-10-CM | POA: Diagnosis not present

## 2022-01-29 DIAGNOSIS — Z9842 Cataract extraction status, left eye: Secondary | ICD-10-CM

## 2022-01-29 DIAGNOSIS — R0789 Other chest pain: Secondary | ICD-10-CM | POA: Diagnosis not present

## 2022-01-29 DIAGNOSIS — R531 Weakness: Secondary | ICD-10-CM | POA: Diagnosis not present

## 2022-01-29 DIAGNOSIS — Z9841 Cataract extraction status, right eye: Secondary | ICD-10-CM

## 2022-01-29 DIAGNOSIS — R1032 Left lower quadrant pain: Secondary | ICD-10-CM | POA: Diagnosis not present

## 2022-01-29 DIAGNOSIS — E538 Deficiency of other specified B group vitamins: Secondary | ICD-10-CM | POA: Diagnosis present

## 2022-01-29 DIAGNOSIS — B370 Candidal stomatitis: Secondary | ICD-10-CM | POA: Diagnosis present

## 2022-01-29 DIAGNOSIS — E279 Disorder of adrenal gland, unspecified: Secondary | ICD-10-CM | POA: Diagnosis present

## 2022-01-29 DIAGNOSIS — S2221XA Fracture of manubrium, initial encounter for closed fracture: Principal | ICD-10-CM | POA: Diagnosis present

## 2022-01-29 DIAGNOSIS — G319 Degenerative disease of nervous system, unspecified: Secondary | ICD-10-CM | POA: Diagnosis not present

## 2022-01-29 DIAGNOSIS — Z961 Presence of intraocular lens: Secondary | ICD-10-CM | POA: Diagnosis present

## 2022-01-29 DIAGNOSIS — R627 Adult failure to thrive: Secondary | ICD-10-CM | POA: Diagnosis present

## 2022-01-29 DIAGNOSIS — Z66 Do not resuscitate: Secondary | ICD-10-CM | POA: Diagnosis present

## 2022-01-29 DIAGNOSIS — S2222XD Fracture of body of sternum, subsequent encounter for fracture with routine healing: Secondary | ICD-10-CM | POA: Diagnosis not present

## 2022-01-29 DIAGNOSIS — S22060A Wedge compression fracture of T7-T8 vertebra, initial encounter for closed fracture: Secondary | ICD-10-CM

## 2022-01-29 DIAGNOSIS — S22068A Other fracture of T7-T8 thoracic vertebra, initial encounter for closed fracture: Secondary | ICD-10-CM | POA: Diagnosis not present

## 2022-01-29 DIAGNOSIS — R1310 Dysphagia, unspecified: Secondary | ICD-10-CM | POA: Diagnosis not present

## 2022-01-29 DIAGNOSIS — R2689 Other abnormalities of gait and mobility: Secondary | ICD-10-CM | POA: Diagnosis not present

## 2022-01-29 DIAGNOSIS — Z87891 Personal history of nicotine dependence: Secondary | ICD-10-CM

## 2022-01-29 DIAGNOSIS — M4854XA Collapsed vertebra, not elsewhere classified, thoracic region, initial encounter for fracture: Secondary | ICD-10-CM | POA: Diagnosis present

## 2022-01-29 DIAGNOSIS — J439 Emphysema, unspecified: Secondary | ICD-10-CM | POA: Diagnosis present

## 2022-01-29 DIAGNOSIS — Z043 Encounter for examination and observation following other accident: Secondary | ICD-10-CM | POA: Diagnosis not present

## 2022-01-29 DIAGNOSIS — Z7401 Bed confinement status: Secondary | ICD-10-CM | POA: Diagnosis not present

## 2022-01-29 DIAGNOSIS — J9611 Chronic respiratory failure with hypoxia: Secondary | ICD-10-CM | POA: Diagnosis present

## 2022-01-29 DIAGNOSIS — Z823 Family history of stroke: Secondary | ICD-10-CM

## 2022-01-29 DIAGNOSIS — J449 Chronic obstructive pulmonary disease, unspecified: Secondary | ICD-10-CM | POA: Diagnosis not present

## 2022-01-29 DIAGNOSIS — Z8711 Personal history of peptic ulcer disease: Secondary | ICD-10-CM

## 2022-01-29 DIAGNOSIS — R0600 Dyspnea, unspecified: Secondary | ICD-10-CM | POA: Diagnosis not present

## 2022-01-29 DIAGNOSIS — Z79899 Other long term (current) drug therapy: Secondary | ICD-10-CM

## 2022-01-29 DIAGNOSIS — R630 Anorexia: Secondary | ICD-10-CM | POA: Diagnosis present

## 2022-01-29 DIAGNOSIS — M6281 Muscle weakness (generalized): Secondary | ICD-10-CM | POA: Diagnosis not present

## 2022-01-29 DIAGNOSIS — M4312 Spondylolisthesis, cervical region: Secondary | ICD-10-CM | POA: Diagnosis not present

## 2022-01-29 DIAGNOSIS — R079 Chest pain, unspecified: Secondary | ICD-10-CM | POA: Diagnosis not present

## 2022-01-29 DIAGNOSIS — S2221XK Fracture of manubrium, subsequent encounter for fracture with nonunion: Secondary | ICD-10-CM | POA: Diagnosis not present

## 2022-01-29 DIAGNOSIS — Z9181 History of falling: Secondary | ICD-10-CM | POA: Diagnosis not present

## 2022-01-29 LAB — CBC WITH DIFFERENTIAL/PLATELET
Abs Immature Granulocytes: 0.05 10*3/uL (ref 0.00–0.07)
Basophils Absolute: 0 10*3/uL (ref 0.0–0.1)
Basophils Relative: 0 %
Eosinophils Absolute: 0 10*3/uL (ref 0.0–0.5)
Eosinophils Relative: 0 %
HCT: 34.4 % — ABNORMAL LOW (ref 36.0–46.0)
Hemoglobin: 10.9 g/dL — ABNORMAL LOW (ref 12.0–15.0)
Immature Granulocytes: 1 %
Lymphocytes Relative: 6 %
Lymphs Abs: 0.6 10*3/uL — ABNORMAL LOW (ref 0.7–4.0)
MCH: 29.2 pg (ref 26.0–34.0)
MCHC: 31.7 g/dL (ref 30.0–36.0)
MCV: 92.2 fL (ref 80.0–100.0)
Monocytes Absolute: 1 10*3/uL (ref 0.1–1.0)
Monocytes Relative: 10 %
Neutro Abs: 8.6 10*3/uL — ABNORMAL HIGH (ref 1.7–7.7)
Neutrophils Relative %: 83 %
Platelets: 169 10*3/uL (ref 150–400)
RBC: 3.73 MIL/uL — ABNORMAL LOW (ref 3.87–5.11)
RDW: 13 % (ref 11.5–15.5)
WBC: 10.3 10*3/uL (ref 4.0–10.5)
nRBC: 0 % (ref 0.0–0.2)

## 2022-01-29 LAB — BASIC METABOLIC PANEL
Anion gap: 9 (ref 5–15)
BUN: 17 mg/dL (ref 8–23)
CO2: 31 mmol/L (ref 22–32)
Calcium: 9.1 mg/dL (ref 8.9–10.3)
Chloride: 100 mmol/L (ref 98–111)
Creatinine, Ser: 1.11 mg/dL — ABNORMAL HIGH (ref 0.44–1.00)
GFR, Estimated: 47 mL/min — ABNORMAL LOW (ref >60)
Glucose, Bld: 128 mg/dL — ABNORMAL HIGH (ref 70–99)
Potassium: 2.9 mmol/L — ABNORMAL LOW (ref 3.5–5.1)
Sodium: 140 mmol/L (ref 135–145)

## 2022-01-29 MED ORDER — FENTANYL CITRATE PF 50 MCG/ML IJ SOSY
25.0000 ug | PREFILLED_SYRINGE | Freq: Once | INTRAMUSCULAR | Status: AC
  Administered 2022-01-30: 25 ug via INTRAVENOUS
  Filled 2022-01-29: qty 1

## 2022-01-29 NOTE — ED Provider Notes (Signed)
[facility] DEPT Provider Note   CSN: [id number] Arrival date & time: 01/29/22  2210     History {Add pertinent medical, surgical, social history, OB history to HPI:1} Chief Complaint  Patient presents with   Chest Pain    From recent injury/sternum fx    Katherine [name] is a 88 y.o. female presents emergency department with a chief complaint of chest wall and upper back pain.  Patient was seen 10 days ago in the ER after a fall that occurred 15 days ago.  She was seen by her primary care physician on July 21 after she hit her chest on the edge of a chair when attempting to transfer.  She is complaining of severe chest wall pain at that time and had notably low oxygen saturations sent for outpatient x-ray that showed sternal fracture.  She was seen again in the emergency department had repeat plain films and was given pain control.  Review of EMR shows that the patient called her PCP and had a video visit due to severe chest pain.  This is despite being on tramadol and hydrocodone 10-3 20 5/2 tablet every 8 hours at home.  She is currently on oxygen supplementation due to hypoxia presumably from her chest wall pain.  Reports that her pain is intolerable.  She has significant pain at rest but severe pain whenever she tries to change position, ambulate or move.  She lives with her husband who is having significant difficulty taking care of her as they are both in their 108s.  Patient states that at first her pain was just localized to the front of her chest but now has pain in the midthoracic region on the right side as well.  No new injuries.  Chest Pain      Home Medications Prior to Admission medications   Medication Sig Start Date End Date Taking? Authorizing Provider  albuterol (VENTOLIN HFA) 108 (90 Base) MCG/ACT inhaler INHALE 2 PUFFS INTO THE LUNGS 4 (FOUR) TIMES DAILY AS NEEDED FOR WHEEZING OR SHORTNESS OF BREATH. Patient taking differently: Inhale 2  puffs into the lungs every 4 (four) hours as needed for wheezing or shortness of breath. 07/10/21   Byrum, Rose Fillers, MD  ALPRAZolam Duanne Moron) 0.25 MG tablet TAKE ONE TAB UP TO TWICE DAILY AS NEEDED FOR ANXIETY AND SLEEP 12/22/21   Elby Showers, MD  aspirin 81 MG chewable tablet Chew 1 tablet (81 mg total) by mouth daily. 08/19/21   Eileen Stanford, PA-C  budesonide (PULMICORT) 0.5 MG/2ML nebulizer solution TAKE 2 MLS (0.5 MG TOTAL) BY NEBULIZATION 2 (TWO) TIMES DAILY. 09/23/21   Cobb, Karie Schwalbe, NP  Cyanocobalamin (VITAMIN B-12 PO) Take 1 tablet by mouth daily.    [provider]  diclofenac Sodium (VOLTAREN) 1 % GEL Apply 4 g topically 4 (four) times daily. 01/08/22   Deno Etienne, DO  escitalopram (LEXAPRO) 5 MG tablet TAKE 1 TABLET BY MOUTH EVERY DAY Patient taking differently: Take 5 mg by mouth at bedtime. 10/01/21   Elby Showers, MD  fluticasone (FLONASE) 50 MCG/ACT nasal spray Place 1 spray into both nostrils daily. 07/24/21   Cobb, Karie Schwalbe, NP  HYDROcodone-acetaminophen (NORCO) 10-325 MG tablet One half to one tab every 8 hours as needed for pain 01/10/22   Elby Showers, MD  ipratropium (ATROVENT) 0.02 % nebulizer solution TAKE 2.5 MLS (0.5 MG TOTAL) BY NEBULIZATION 4 (FOUR) TIMES DAILY. Patient taking differently: Take 0.5 mg by nebulization every 6 (  six) hours as needed for wheezing or shortness of breath. 02/09/21   Baxley, Cresenciano Lick, MD  iron polysaccharides (NIFEREX) 150 MG capsule TAKE 1 CAPSULE BY MOUTH TWICE A DAY 01/05/22   Elby Showers, MD  loperamide (IMODIUM) 2 MG capsule Take 1 capsule (2 mg total) by mouth 4 (four) times daily as needed for diarrhea or loose stools. 10/12/21   Kathie Dike, MD  Melatonin 10 MG CAPS Take 10 mg by mouth at bedtime.    [provider]  morphine (MSIR) 15 MG tablet Take 0.5 tablets (7.5 mg total) by mouth every 4 (four) hours as needed for severe pain. 01/08/22   Deno Etienne, DO  Multiple Vitamins-Minerals (MULTIVITAMIN WITH  MINERALS) tablet Take 1 tablet by mouth daily.    [provider]  pantoprazole (PROTONIX) 40 MG tablet TAKE 1 TABLET BY MOUTH EVERY DAY 10/14/21   Cobb, Karie Schwalbe, NP  promethazine (PHENERGAN) 12.5 MG tablet TAKE 1 TABLET (12.5 MG TOTAL) BY MOUTH EVERY 8 (EIGHT) HOURS AS NEEDED FOR NAUSEA OR VOMITING. 09/13/21   Elby Showers, MD  traMADol (ULTRAM) 50 MG tablet Take 50 mg by mouth every 6 (six) hours as needed for moderate pain.  07/18/19   [provider]  traZODone (DESYREL) 50 MG tablet TAKE 3 TABLETS BY MOUTH AT BEDTIME 11/24/21   Elby Showers, MD      Allergies    Patient has no known allergies.    Review of Systems   Review of Systems  Cardiovascular:  Positive for chest pain.    Physical Exam Updated Vital Signs There were no vitals taken for this visit. Physical Exam Vitals and nursing note reviewed.  Constitutional:      General: She is not in acute distress.    Appearance: She is well-developed. She is not diaphoretic.  HENT:     Head: Normocephalic and atraumatic.     Right Ear: External ear normal.     Left Ear: External ear normal.     Nose: Nose normal.     Mouth/Throat:     Mouth: Mucous membranes are moist.  Eyes:     General: No scleral icterus.    Conjunctiva/sclera: Conjunctivae normal.  Cardiovascular:     Rate and Rhythm: Normal rate and regular rhythm.     Heart sounds: Normal heart sounds. No murmur heard.    No friction rub. No gallop.  Pulmonary:     Effort: Pulmonary effort is normal. No respiratory distress.     Breath sounds: Normal breath sounds.  Chest:     Comments: Tender to palpation along the sternum right lateral border Abdominal:     General: Bowel sounds are normal. There is no distension.     Palpations: Abdomen is soft. There is no mass.     Tenderness: There is no abdominal tenderness. There is no guarding.  Musculoskeletal:     Cervical back: Normal range of motion.     Thoracic back: Bony tenderness present.        Back:  Skin:    General: Skin is warm and dry.  Neurological:     Mental Status: She is alert and oriented to person, place, and time.  Psychiatric:        Behavior: Behavior normal.     ED Results / Procedures / Treatments   Labs (all labs ordered are listed, but only abnormal results are displayed) Labs Reviewed - No data to display  EKG None  Radiology No  results found.  Procedures Procedures  {Document cardiac monitor, telemetry assessment procedure when appropriate:1}  Medications Ordered in ED Medications - No data to display  ED Course/ Medical Decision Making/ A&P                           Medical Decision Making Amount and/or Complexity of Data Reviewed Labs: ordered. Radiology: ordered.   ***  {Document critical care time when appropriate:1} {Document review of labs and clinical decision tools ie heart score, Chads2Vasc2 etc:1}  {Document your independent review of radiology images, and any outside records:1} {Document your discussion with family members, caretakers, and with consultants:1} {Document social determinants of health affecting pt's care:1} {Document your decision making why or why not admission, treatments were needed:1} Final Clinical Impression(s) / ED Diagnoses Final diagnoses:  None    Rx / DC Orders ED Discharge Orders     None

## 2022-01-29 NOTE — ED Triage Notes (Signed)
Pt c/o right sided chest pain worsening recently following a fall 3 weeks ago that resulted in a sternum fx. Pt states that the pain has caused her to be unable to "function like normal". Pt does have a husband who has been helping at home.

## 2022-01-30 ENCOUNTER — Emergency Department (HOSPITAL_COMMUNITY): Payer: Medicare PPO

## 2022-01-30 ENCOUNTER — Telehealth: Payer: Self-pay | Admitting: Internal Medicine

## 2022-01-30 ENCOUNTER — Encounter (HOSPITAL_COMMUNITY): Payer: Self-pay

## 2022-01-30 DIAGNOSIS — Z9842 Cataract extraction status, left eye: Secondary | ICD-10-CM | POA: Diagnosis not present

## 2022-01-30 DIAGNOSIS — S2221XA Fracture of manubrium, initial encounter for closed fracture: Secondary | ICD-10-CM | POA: Diagnosis present

## 2022-01-30 DIAGNOSIS — R9431 Abnormal electrocardiogram [ECG] [EKG]: Secondary | ICD-10-CM

## 2022-01-30 DIAGNOSIS — S22000A Wedge compression fracture of unspecified thoracic vertebra, initial encounter for closed fracture: Secondary | ICD-10-CM

## 2022-01-30 DIAGNOSIS — Z9841 Cataract extraction status, right eye: Secondary | ICD-10-CM | POA: Diagnosis not present

## 2022-01-30 DIAGNOSIS — E876 Hypokalemia: Secondary | ICD-10-CM

## 2022-01-30 DIAGNOSIS — Y92009 Unspecified place in unspecified non-institutional (private) residence as the place of occurrence of the external cause: Secondary | ICD-10-CM | POA: Diagnosis not present

## 2022-01-30 DIAGNOSIS — Z9981 Dependence on supplemental oxygen: Secondary | ICD-10-CM | POA: Diagnosis not present

## 2022-01-30 DIAGNOSIS — Z952 Presence of prosthetic heart valve: Secondary | ICD-10-CM | POA: Diagnosis not present

## 2022-01-30 DIAGNOSIS — S2249XA Multiple fractures of ribs, unspecified side, initial encounter for closed fracture: Secondary | ICD-10-CM

## 2022-01-30 DIAGNOSIS — Z66 Do not resuscitate: Secondary | ICD-10-CM | POA: Diagnosis present

## 2022-01-30 DIAGNOSIS — J439 Emphysema, unspecified: Secondary | ICD-10-CM | POA: Diagnosis present

## 2022-01-30 DIAGNOSIS — E785 Hyperlipidemia, unspecified: Secondary | ICD-10-CM | POA: Diagnosis present

## 2022-01-30 DIAGNOSIS — E538 Deficiency of other specified B group vitamins: Secondary | ICD-10-CM | POA: Diagnosis present

## 2022-01-30 DIAGNOSIS — J449 Chronic obstructive pulmonary disease, unspecified: Secondary | ICD-10-CM | POA: Diagnosis not present

## 2022-01-30 DIAGNOSIS — E278 Other specified disorders of adrenal gland: Secondary | ICD-10-CM

## 2022-01-30 DIAGNOSIS — S2241XA Multiple fractures of ribs, right side, initial encounter for closed fracture: Secondary | ICD-10-CM | POA: Diagnosis not present

## 2022-01-30 DIAGNOSIS — G9341 Metabolic encephalopathy: Secondary | ICD-10-CM | POA: Diagnosis not present

## 2022-01-30 DIAGNOSIS — N390 Urinary tract infection, site not specified: Secondary | ICD-10-CM | POA: Diagnosis present

## 2022-01-30 DIAGNOSIS — R296 Repeated falls: Secondary | ICD-10-CM | POA: Diagnosis present

## 2022-01-30 DIAGNOSIS — J9611 Chronic respiratory failure with hypoxia: Secondary | ICD-10-CM | POA: Diagnosis present

## 2022-01-30 DIAGNOSIS — E46 Unspecified protein-calorie malnutrition: Secondary | ICD-10-CM | POA: Diagnosis present

## 2022-01-30 DIAGNOSIS — E279 Disorder of adrenal gland, unspecified: Secondary | ICD-10-CM | POA: Diagnosis present

## 2022-01-30 DIAGNOSIS — B370 Candidal stomatitis: Secondary | ICD-10-CM | POA: Diagnosis present

## 2022-01-30 DIAGNOSIS — M4854XA Collapsed vertebra, not elsewhere classified, thoracic region, initial encounter for fracture: Secondary | ICD-10-CM | POA: Diagnosis present

## 2022-01-30 DIAGNOSIS — Z681 Body mass index (BMI) 19 or less, adult: Secondary | ICD-10-CM | POA: Diagnosis not present

## 2022-01-30 DIAGNOSIS — Z961 Presence of intraocular lens: Secondary | ICD-10-CM | POA: Diagnosis present

## 2022-01-30 DIAGNOSIS — K5900 Constipation, unspecified: Secondary | ICD-10-CM | POA: Diagnosis present

## 2022-01-30 DIAGNOSIS — R630 Anorexia: Secondary | ICD-10-CM | POA: Diagnosis present

## 2022-01-30 DIAGNOSIS — R627 Adult failure to thrive: Secondary | ICD-10-CM | POA: Diagnosis present

## 2022-01-30 DIAGNOSIS — S2221XK Fracture of manubrium, subsequent encounter for fracture with nonunion: Secondary | ICD-10-CM | POA: Diagnosis not present

## 2022-01-30 DIAGNOSIS — W1839XA Other fall on same level, initial encounter: Secondary | ICD-10-CM | POA: Diagnosis present

## 2022-01-30 DIAGNOSIS — S2222XA Fracture of body of sternum, initial encounter for closed fracture: Secondary | ICD-10-CM | POA: Diagnosis present

## 2022-01-30 LAB — BASIC METABOLIC PANEL
Anion gap: 4 — ABNORMAL LOW (ref 5–15)
BUN: 14 mg/dL (ref 8–23)
CO2: 24 mmol/L (ref 22–32)
Calcium: 7.3 mg/dL — ABNORMAL LOW (ref 8.9–10.3)
Chloride: 108 mmol/L (ref 98–111)
Creatinine, Ser: 0.97 mg/dL (ref 0.44–1.00)
GFR, Estimated: 55 mL/min — ABNORMAL LOW (ref >60)
Glucose, Bld: 258 mg/dL — ABNORMAL HIGH (ref 70–99)
Potassium: 4.5 mmol/L (ref 3.5–5.1)
Sodium: 136 mmol/L (ref 135–145)

## 2022-01-30 LAB — CBC
HCT: 33.2 % — ABNORMAL LOW (ref 36.0–46.0)
Hemoglobin: 10.3 g/dL — ABNORMAL LOW (ref 12.0–15.0)
MCH: 29.3 pg (ref 26.0–34.0)
MCHC: 31 g/dL (ref 30.0–36.0)
MCV: 94.3 fL (ref 80.0–100.0)
Platelets: 186 10*3/uL (ref 150–400)
RBC: 3.52 MIL/uL — ABNORMAL LOW (ref 3.87–5.11)
RDW: 13.1 % (ref 11.5–15.5)
WBC: 9.6 10*3/uL (ref 4.0–10.5)
nRBC: 0 % (ref 0.0–0.2)

## 2022-01-30 LAB — URINALYSIS, ROUTINE W REFLEX MICROSCOPIC
Bilirubin Urine: NEGATIVE
Glucose, UA: NEGATIVE mg/dL
Ketones, ur: NEGATIVE mg/dL
Nitrite: NEGATIVE
Protein, ur: 30 mg/dL — AB
Specific Gravity, Urine: 1.021 (ref 1.005–1.030)
pH: 6 (ref 5.0–8.0)

## 2022-01-30 LAB — MAGNESIUM: Magnesium: 1.7 mg/dL (ref 1.7–2.4)

## 2022-01-30 LAB — PHOSPHORUS: Phosphorus: 2.4 mg/dL — ABNORMAL LOW (ref 2.5–4.6)

## 2022-01-30 LAB — PREALBUMIN: Prealbumin: 15 mg/dL — ABNORMAL LOW (ref 18–38)

## 2022-01-30 MED ORDER — DIPHENHYDRAMINE HCL 50 MG/ML IJ SOLN: 12.5000 mg | Freq: Four times a day (QID) | INTRAMUSCULAR | Status: DC | PRN

## 2022-01-30 MED ORDER — LACTATED RINGERS IV SOLN: INTRAVENOUS | Status: DC

## 2022-01-30 MED ORDER — METOPROLOL TARTRATE 5 MG/5ML IV SOLN: 5.0000 mg | Freq: Four times a day (QID) | INTRAVENOUS | Status: DC | PRN

## 2022-01-30 MED ORDER — MORPHINE SULFATE (PF) 4 MG/ML IV SOLN
4.0000 mg | Freq: Once | INTRAVENOUS | Status: AC
  Administered 2022-01-30: 4 mg via INTRAVENOUS
  Filled 2022-01-30: qty 1

## 2022-01-30 MED ORDER — METHOCARBAMOL 1000 MG/10ML IJ SOLN: 500.0000 mg | Freq: Four times a day (QID) | INTRAVENOUS | Status: DC | PRN

## 2022-01-30 MED ORDER — PHENOL 1.4 % MT LIQD: 2.0000 | OROMUCOSAL | Status: DC | PRN

## 2022-01-30 MED ORDER — ALUM & MAG HYDROXIDE-SIMETH 200-200-20 MG/5ML PO SUSP: 30.0000 mL | Freq: Four times a day (QID) | ORAL | Status: DC | PRN

## 2022-01-30 MED ORDER — ALBUMIN HUMAN 5 % IV SOLN: 12.5000 g | Freq: Four times a day (QID) | INTRAVENOUS | Status: DC | PRN

## 2022-01-30 MED ORDER — TRAZODONE HCL 50 MG PO TABS
50.0000 mg | ORAL_TABLET | Freq: Every evening | ORAL | Status: DC | PRN
Start: 2022-01-30 — End: 2022-02-04
  Administered 2022-02-01 – 2022-02-03 (×2): 50 mg via ORAL
  Filled 2022-01-30 (×3): qty 1

## 2022-01-30 MED ORDER — ALPRAZOLAM 0.25 MG PO TABS
0.2500 mg | ORAL_TABLET | Freq: Every day | ORAL | Status: DC
  Administered 2022-01-30 – 2022-02-03 (×5): 0.25 mg via ORAL
  Filled 2022-01-30 (×5): qty 1

## 2022-01-30 MED ORDER — BISACODYL 10 MG RE SUPP: 10.0000 mg | Freq: Two times a day (BID) | RECTAL | Status: DC | PRN

## 2022-01-30 MED ORDER — SODIUM CHLORIDE 0.9 % IV SOLN
1.0000 g | INTRAVENOUS | Status: DC
  Administered 2022-01-30 – 2022-02-04 (×6): 1 g via INTRAVENOUS
  Filled 2022-01-30 (×6): qty 10

## 2022-01-30 MED ORDER — SODIUM CHLORIDE (PF) 0.9 % IJ SOLN
INTRAMUSCULAR | Status: AC
  Filled 2022-01-30: qty 50

## 2022-01-30 MED ORDER — POLYETHYLENE GLYCOL 3350 17 G PO PACK: 17.0000 g | PACK | Freq: Two times a day (BID) | ORAL | Status: DC

## 2022-01-30 MED ORDER — HYDROMORPHONE HCL 2 MG/ML IJ SOLN
0.5000 mg | INTRAMUSCULAR | Status: DC | PRN
  Administered 2022-01-30 (×2): 1 mg via INTRAVENOUS
  Filled 2022-01-30 (×2): qty 1

## 2022-01-30 MED ORDER — POLYETHYLENE GLYCOL 3350 17 G PO PACK: 17.0000 g | PACK | Freq: Every day | ORAL | Status: DC | PRN

## 2022-01-30 MED ORDER — POTASSIUM CHLORIDE 10 MEQ/100ML IV SOLN
10.0000 meq | INTRAVENOUS | Status: AC
  Administered 2022-01-30 (×3): 10 meq via INTRAVENOUS
  Filled 2022-01-30 (×5): qty 100

## 2022-01-30 MED ORDER — SODIUM CHLORIDE 0.9 % IV SOLN: 8.0000 mg | Freq: Four times a day (QID) | INTRAVENOUS | Status: DC | PRN

## 2022-01-30 MED ORDER — IOHEXOL 350 MG/ML SOLN
60.0000 mL | Freq: Once | INTRAVENOUS | Status: AC | PRN
  Administered 2022-01-30: 60 mL via INTRAVENOUS

## 2022-01-30 MED ORDER — BUDESONIDE 0.5 MG/2ML IN SUSP
0.5000 mg | Freq: Two times a day (BID) | RESPIRATORY_TRACT | Status: DC
  Administered 2022-01-30 – 2022-02-04 (×9): 0.5 mg via RESPIRATORY_TRACT
  Filled 2022-01-30 (×10): qty 2

## 2022-01-30 MED ORDER — LIP MEDEX EX OINT
TOPICAL_OINTMENT | Freq: Two times a day (BID) | CUTANEOUS | Status: DC
  Administered 2022-01-30: 1 via TOPICAL
  Filled 2022-01-30: qty 7

## 2022-01-30 MED ORDER — GABAPENTIN 100 MG PO CAPS
100.0000 mg | ORAL_CAPSULE | Freq: Three times a day (TID) | ORAL | Status: AC
  Administered 2022-01-30 – 2022-02-01 (×9): 100 mg via ORAL
  Filled 2022-01-30 (×9): qty 1

## 2022-01-30 MED ORDER — ACETAMINOPHEN 500 MG PO TABS
1000.0000 mg | ORAL_TABLET | Freq: Four times a day (QID) | ORAL | Status: DC
  Administered 2022-01-30 – 2022-02-04 (×14): 1000 mg via ORAL
  Filled 2022-01-30 (×14): qty 2

## 2022-01-30 MED ORDER — ASPIRIN 81 MG PO TBEC
81.0000 mg | DELAYED_RELEASE_TABLET | Freq: Every day | ORAL | Status: DC
  Administered 2022-01-30 – 2022-02-04 (×6): 81 mg via ORAL
  Filled 2022-01-30 (×6): qty 1

## 2022-01-30 MED ORDER — MELATONIN 3 MG PO TABS
3.0000 mg | ORAL_TABLET | Freq: Every day | ORAL | Status: DC
  Administered 2022-01-30 – 2022-02-02 (×4): 3 mg via ORAL
  Filled 2022-01-30 (×4): qty 1

## 2022-01-30 MED ORDER — SIMETHICONE 40 MG/0.6ML PO SUSP: 80.0000 mg | Freq: Four times a day (QID) | ORAL | Status: DC | PRN

## 2022-01-30 MED ORDER — POLYETHYLENE GLYCOL 3350 17 G PO PACK: 17.0000 g | PACK | Freq: Two times a day (BID) | ORAL | Status: DC | PRN

## 2022-01-30 MED ORDER — HEPARIN SODIUM (PORCINE) 5000 UNIT/ML IJ SOLN
5000.0000 [IU] | Freq: Three times a day (TID) | INTRAMUSCULAR | Status: DC
  Administered 2022-01-31 – 2022-02-03 (×12): 5000 [IU] via SUBCUTANEOUS
  Filled 2022-01-30 (×12): qty 1

## 2022-01-30 MED ORDER — ONDANSETRON HCL 4 MG/2ML IJ SOLN: 4.0000 mg | Freq: Four times a day (QID) | INTRAMUSCULAR | Status: DC | PRN

## 2022-01-30 MED ORDER — VITAMIN B-12 1000 MCG PO TABS
500.0000 ug | ORAL_TABLET | Freq: Every day | ORAL | Status: DC
  Administered 2022-01-31 – 2022-02-04 (×5): 500 ug via ORAL
  Filled 2022-01-30: qty 5
  Filled 2022-01-30 (×4): qty 1

## 2022-01-30 MED ORDER — PANTOPRAZOLE SODIUM 40 MG PO TBEC
40.0000 mg | DELAYED_RELEASE_TABLET | Freq: Every day | ORAL | Status: DC
  Administered 2022-01-30 – 2022-02-04 (×6): 40 mg via ORAL
  Filled 2022-01-30 (×6): qty 1

## 2022-01-30 MED ORDER — NALOXONE HCL 0.4 MG/ML IJ SOLN
0.4000 mg | INTRAMUSCULAR | Status: DC | PRN
Start: 2022-01-30 — End: 2022-02-04

## 2022-01-30 MED ORDER — MAGNESIUM SULFATE IN D5W 1-5 GM/100ML-% IV SOLN
1.0000 g | Freq: Once | INTRAVENOUS | Status: AC
  Administered 2022-01-30: 1 g via INTRAVENOUS
  Filled 2022-01-30: qty 100

## 2022-01-30 MED ORDER — METHOCARBAMOL 500 MG PO TABS
1000.0000 mg | ORAL_TABLET | Freq: Four times a day (QID) | ORAL | Status: DC | PRN
  Administered 2022-01-30: 1000 mg via ORAL
  Filled 2022-01-30: qty 2

## 2022-01-30 MED ORDER — POTASSIUM CHLORIDE CRYS ER 20 MEQ PO TBCR: 40.0000 meq | EXTENDED_RELEASE_TABLET | Freq: Every day | ORAL | Status: DC

## 2022-01-30 MED ORDER — OXYCODONE HCL 5 MG PO TABS
2.5000 mg | ORAL_TABLET | ORAL | Status: DC | PRN
  Administered 2022-01-31 – 2022-02-04 (×8): 10 mg via ORAL
  Filled 2022-01-30 (×10): qty 2

## 2022-01-30 MED ORDER — MAGIC MOUTHWASH: 15.0000 mL | Freq: Four times a day (QID) | ORAL | Status: DC | PRN

## 2022-01-30 MED ORDER — TRAMADOL HCL 50 MG PO TABS
50.0000 mg | ORAL_TABLET | Freq: Three times a day (TID) | ORAL | Status: DC | PRN
  Administered 2022-01-30 – 2022-02-03 (×5): 50 mg via ORAL
  Filled 2022-01-30 (×5): qty 1

## 2022-01-30 MED ORDER — MENTHOL 3 MG MT LOZG: 1.0000 | LOZENGE | OROMUCOSAL | Status: DC | PRN

## 2022-01-30 MED ORDER — HYDROMORPHONE HCL 1 MG/ML IJ SOLN: 0.5000 mg | INTRAMUSCULAR | Status: DC | PRN

## 2022-01-30 MED ORDER — POTASSIUM CHLORIDE CRYS ER 20 MEQ PO TBCR
40.0000 meq | EXTENDED_RELEASE_TABLET | Freq: Once | ORAL | Status: AC
  Administered 2022-01-30: 40 meq via ORAL
  Filled 2022-01-30: qty 2

## 2022-01-30 NOTE — Telephone Encounter (Signed)
Patients husband called and said that patient is in the hosptial because she fell and broke some ribs Tuesday night. They went to [facility] last night, They said she had 2 cracked ribs. He said they mentioned rehab, He wanted to know if Dr Renold Genta had any recommendations for a rehab [phone]

## 2022-01-30 NOTE — ED Notes (Signed)
Call placed for transportation to [place].

## 2022-01-30 NOTE — ED Notes (Signed)
Advised pt need for non-slip socks due to fall risk precautions, pt refused.

## 2022-01-30 NOTE — H&P (Signed)
History and Physical    Katherine [name] JXB:[id number] DOB: 01/18/1929 DOA: 01/29/2022  PCP: Elby Showers, MD  Patient coming from: Home  Chief Complaint: Chest pain  HPI: Katherine [name] is a 88 y.o. female with medical history significant of severe COPD, chronic hypoxic respiratory failure, severe aortic stenosis status post TAVR, hyperlipidemia, PUD, iron deficiency anemia. Seen by PCP last month on 7/21 after a fall and x-ray revealed acute sternal fracture.  She was prescribed Norco for pain.  Seen in the ED on 7/26 for ongoing pain and was prescribed oral morphine.  PCP had also referred the patient to pain management and was prescribed tramadol.  Patient returned to the ED tonight with ongoing pain after another fall.  In the ED, stable on 2 L home oxygen.  Labs showing hemoglobin 10.9, stable.  Potassium 2.9, magnesium 1.7.  Creatinine 1.1, stable.  Phosphorus 2.4.  UA with negative nitrite, trace leukocytes, and microscopy showing 11-20 WBCs and few bacteria.  CT head negative for acute intracranial abnormality.  CT C-spine negative for acute finding.  CT chest/abdomen/pelvis without contrast showing: "IMPRESSION: 1. Fracture deformities involving the body and manubrium of the sternum. 2. Chronic compression fracture deformities at the levels of T8, T9 and T12 with a new compression fracture deformity of the T7 vertebral body. 3. Stable 6 mm right lower lobe noncalcified lung nodule. 4. Marked severity emphysematous lung disease with stable mild to moderate severity posterior right upper lobe linear scarring. 5. Multiple hepatic cysts. 6. Stable low-attenuation left adrenal mass which likely represents a benign adenoma. Correlation with 1 year follow-up adrenal washout CT is recommended. This recommendation follows ACR consensus guidelines: Management of Incidental Adrenal Masses: A White Paper of the ACR Incidental Findings Committee. J Am Coll Radiol 2017;14:1038-1044. 7.  Sigmoid diverticulosis. 8. Extensive postoperative changes within the mid and lower lumbar spine.   Aortic Atherosclerosis (ICD10-I70.0) and Emphysema (ICD10-J43.9)."  CTA chest showing: "IMPRESSION: 1. Acute fractures involving the body and manubrium of the sternum. 2. Ill-defined deformities of indeterminate age along the anterolateral aspects of the sixth and seventh right ribs. Correlation with physical examination is recommended to determine the presence of point tenderness. 3. Chronic compression fracture deformities of the T8, T9 and T12 vertebral bodies. 4. New compression fracture deformity of the T7 vertebral body. 5. Stable 6 mm noncalcified lung nodule within the lateral aspect of the right lower lobe. Non-contrast chest CT at 6-12 months is recommended. If the nodule is stable at time of repeat CT, then future CT at 18-24 months (from today's scan) is considered optional for low-risk patients, but is recommended for high-risk patients. This recommendation follows the consensus statement: Guidelines for Management of Incidental Pulmonary Nodules Detected on CT Images: From the Fleischner Society 2017; Radiology 2017; 284:228-243. 6. Marked severity emphysematous lung disease with stable bilateral upper lobe and posterior left basilar atelectatic changes.   Aortic Atherosclerosis (ICD10-I70.0) and Emphysema (ICD10-J43.9)."  Patient was given Tylenol, fentanyl, morphine, oral and IV potassium.  ED physician discussed the case with trauma surgery, recommended transfer to [facility] and will consult.  Patient states she has a chairlift at home and fell 2 days ago while trying to get up from it.  Her husband was present with her at that time and reports patient falling against the wall.  Denies loss of consciousness.  Patient does not think that she struck her head during this fall.  However, reports severe right-sided chest pain and back pain since after  this fall.   Reports chronic shortness of breath due to COPD, no recent change.  She uses 2 L home oxygen chronically.  Patient reports intermittent nausea and vomiting since after she was started on pain meds last month for sternal fracture.  Denies abdominal pain.  She is also endorsing dysuria and urinary frequency/urgency for over a week.  Denies fevers or chills.  Husband states patient has been taking tramadol at home and is no longer on hydrocodone or morphine.  States her pain specialist had increased the dose of tramadol but they have not been able to get the new prescription filled yet.  Husband is concerned that he is no longer able to take care of the patient at home and is requesting placement to a rehab facility.  Review of Systems:  Review of Systems  All other systems reviewed and are negative.   Past Medical History:  Diagnosis Date   Allergy    Anemia    Arthritis    Asthma    patient denies   B12 deficiency    COPD (chronic obstructive pulmonary disease) ([place])    Depression    not recent   Diverticulosis    Esophageal stricture    Hepatic cyst 01/30/2010   Hiatal hernia 1985   patient unaware   Hx of adenomatous colonic polyps 2006   Hyperlipidemia    IBS (irritable bowel syndrome)    PONV (postoperative nausea and vomiting)    no nausea or vomitting after surgery 07/2014   Positive PPD    PUD (peptic ulcer disease)    S/P TAVR (transcatheter aortic valve replacement) 08/13/2021   s/p TAVR with a 29m Edwards S3UR via the right carotid approach by Dr. MAngelena Form& Dr. BCyndia Bent  Severe aortic stenosis     Past Surgical History:  Procedure Laterality Date   ABDOMINAL HYSTERECTOMY     abnormal bleeding   APPENDECTOMY     BARTHOLIN GLAND CYST EXCISION     x2   BREAST BIOPSY Bilateral    Milk glnd   CATARACT EXTRACTION W/ INTRAOCULAR LENS  IMPLANT, BILATERAL Bilateral    COLONOSCOPY     COLONOSCOPY W/ POLYPECTOMY     HARDWARE REMOVAL N/A 12/13/2015   Procedure: Removal of  HARDWARE  Lumbar Five-Sacral One ;  Surgeon: GKary Kos MD;  Location: [facility];  Service: Neurosurgery;  Laterality: N/A;   IR KYPHO LUMBAR INC FX REDUCE BONE BX UNI/BIL CANNULATION INC/IMAGING  07/13/2019   PERICARDIAL WINDOW  08/13/2021   Procedure: PERICARDIAL WINDOW;  Surgeon: MBurnell Blanks MD;  Location: [facility]  Service: Open Heart Surgery;;   RIGHT/LEFT HEART CATH AND CORONARY ANGIOGRAPHY N/A 07/11/2021   Procedure: RIGHT/LEFT HEART CATH AND CORONARY ANGIOGRAPHY;  Surgeon: MBurnell Blanks MD;  Location[facility];  Service: Cardiovascular;  Laterality: N/A;   TEE WITHOUT CARDIOVERSION N/A 08/13/2021   Procedure: TRANSESOPHAGEAL ECHOCARDIOGRAM (TEE);  Surgeon: MBurnell Blanks MD;  [facility] [facility]  Service: Open Heart Surgery;  Laterality: N/A;   TRANSCATHETER AORTIC VALVE REPLACEMENT, CAROTID Right 08/13/2021   Procedure: Transcatheter Aortic Valve Replacement-Carotid;  Surgeon: MBurnell Blanks MD;  Location: [facility]  Service: Open Heart Surgery;  Laterality: Right;   UPPER GI ENDOSCOPY     VAGOTOMY AND PYLOROPLASTY     in [place], [place]     reports that she quit smoking about 12 years ago. Her smoking use included cigarettes. She has never used smokeless tobacco. She reports that she does  not drink alcohol and does not use drugs.  No Known Allergies  Family History  Problem Relation Age of Onset   Ulcers Father    Stroke Father    Irritable bowel syndrome Father    Ovarian cancer Sister    Colon cancer Neg Hx     Prior to Admission medications   Medication Sig Start Date End Date Taking? Authorizing Provider  albuterol (VENTOLIN HFA) 108 (90 Base) MCG/ACT inhaler INHALE 2 PUFFS INTO THE LUNGS 4 (FOUR) TIMES DAILY AS NEEDED FOR WHEEZING OR SHORTNESS OF BREATH. Patient taking differently: Inhale 2 puffs into the lungs every 4 (four) hours as needed for wheezing or shortness of breath. 07/10/21   Byrum, Rose Fillers, MD  ALPRAZolam Duanne Moron)  0.25 MG tablet TAKE ONE TAB UP TO TWICE DAILY AS NEEDED FOR ANXIETY AND SLEEP 12/22/21   Elby Showers, MD  aspirin 81 MG chewable tablet Chew 1 tablet (81 mg total) by mouth daily. 08/19/21   Eileen Stanford, PA-C  budesonide (PULMICORT) 0.5 MG/2ML nebulizer solution TAKE 2 MLS (0.5 MG TOTAL) BY NEBULIZATION 2 (TWO) TIMES DAILY. 09/23/21   Cobb, Karie Schwalbe, NP  Cyanocobalamin (VITAMIN B-12 PO) Take 1 tablet by mouth daily.    [provider]  diclofenac Sodium (VOLTAREN) 1 % GEL Apply 4 g topically 4 (four) times daily. 01/08/22   Deno Etienne, DO  escitalopram (LEXAPRO) 5 MG tablet TAKE 1 TABLET BY MOUTH EVERY DAY Patient taking differently: Take 5 mg by mouth at bedtime. 10/01/21   Elby Showers, MD  fluticasone (FLONASE) 50 MCG/ACT nasal spray Place 1 spray into both nostrils daily. 07/24/21   Cobb, Karie Schwalbe, NP  HYDROcodone-acetaminophen (NORCO) 10-325 MG tablet One half to one tab every 8 hours as needed for pain 01/10/22   Elby Showers, MD  ipratropium (ATROVENT) 0.02 % nebulizer solution TAKE 2.5 MLS (0.5 MG TOTAL) BY NEBULIZATION 4 (FOUR) TIMES DAILY. Patient taking differently: Take 0.5 mg by nebulization every 6 (six) hours as needed for wheezing or shortness of breath. 02/09/21   Baxley, Cresenciano Lick, MD  iron polysaccharides (NIFEREX) 150 MG capsule TAKE 1 CAPSULE BY MOUTH TWICE A DAY 01/05/22   Elby Showers, MD  loperamide (IMODIUM) 2 MG capsule Take 1 capsule (2 mg total) by mouth 4 (four) times daily as needed for diarrhea or loose stools. 10/12/21   Kathie Dike, MD  Melatonin 10 MG CAPS Take 10 mg by mouth at bedtime.    [provider]  morphine (MSIR) 15 MG tablet Take 0.5 tablets (7.5 mg total) by mouth every 4 (four) hours as needed for severe pain. 01/08/22   Deno Etienne, DO  Multiple Vitamins-Minerals (MULTIVITAMIN WITH MINERALS) tablet Take 1 tablet by mouth daily.    [provider]  pantoprazole (PROTONIX) 40 MG tablet TAKE 1 TABLET BY MOUTH EVERY DAY  10/14/21   Cobb, Karie Schwalbe, NP  promethazine (PHENERGAN) 12.5 MG tablet TAKE 1 TABLET (12.5 MG TOTAL) BY MOUTH EVERY 8 (EIGHT) HOURS AS NEEDED FOR NAUSEA OR VOMITING. 09/13/21   Elby Showers, MD  traMADol (ULTRAM) 50 MG tablet Take 50 mg by mouth every 6 (six) hours as needed for moderate pain.  07/18/19   [provider]  traZODone (DESYREL) 50 MG tablet TAKE 3 TABLETS BY MOUTH AT BEDTIME 11/24/21   Elby Showers, MD    Physical Exam: Vitals:   01/30/22 0300 01/30/22 0313 01/30/22 0400 01/30/22 0500  BP: (!) 163/85 (!) 166/70 [place] Kitchen)  185/78 (!) 153/73  Pulse:  88 89 89  Resp:  18 (!) 22 (!) 22  Temp:      TempSrc:      SpO2:  95% 98% 100%    Physical Exam Vitals reviewed.  Constitutional:      General: She is not in acute distress. HENT:     Head: Normocephalic and atraumatic.  Eyes:     Extraocular Movements: Extraocular movements intact.  Cardiovascular:     Rate and Rhythm: Normal rate and regular rhythm.     Pulses: Normal pulses.  Pulmonary:     Effort: Pulmonary effort is normal. No respiratory distress.     Breath sounds: Normal breath sounds. No wheezing or rales.  Abdominal:     General: Bowel sounds are normal. There is no distension.     Palpations: Abdomen is soft.     Tenderness: There is no abdominal tenderness.  Musculoskeletal:        General: No swelling or tenderness.     Cervical back: Normal range of motion.  Skin:    General: Skin is warm and dry.  Neurological:     General: No focal deficit present.     Mental Status: She is alert and oriented to person, place, and time.      Labs on Admission: I have personally reviewed following labs and imaging studies  CBC: Recent Labs  Lab 01/29/22 2306  WBC 10.3  NEUTROABS 8.6*  HGB 10.9*  HCT 34.4*  MCV 92.2  PLT 329   Basic Metabolic Panel: Recent Labs  Lab 01/29/22 2306  NA 140  K 2.9*  CL 100  CO2 31  GLUCOSE 128*  BUN 17  CREATININE 1.11*  CALCIUM 9.1  MG 1.7  PHOS 2.4*    GFR: CrCl cannot be calculated (Unknown ideal weight.). Liver Function Tests: No results for input(s): "AST", "ALT", "ALKPHOS", "BILITOT", "PROT", "ALBUMIN" in the last 168 hours. No results for input(s): "LIPASE", "AMYLASE" in the last 168 hours. No results for input(s): "AMMONIA" in the last 168 hours. Coagulation Profile: No results for input(s): "INR", "PROTIME" in the last 168 hours. Cardiac Enzymes: No results for input(s): "CKTOTAL", "CKMB", "CKMBINDEX", "TROPONINI" in the last 168 hours. BNP (last 3 results) No results for input(s): "PROBNP" in the last 8760 hours. HbA1C: No results for input(s): "HGBA1C" in the last 72 hours. CBG: No results for input(s): "GLUCAP" in the last 168 hours. Lipid Profile: No results for input(s): "CHOL", "HDL", "LDLCALC", "TRIG", "CHOLHDL", "LDLDIRECT" in the last 72 hours. Thyroid Function Tests: No results for input(s): "TSH", "T4TOTAL", "FREET4", "T3FREE", "THYROIDAB" in the last 72 hours. Anemia Panel: No results for input(s): "VITAMINB12", "FOLATE", "FERRITIN", "TIBC", "IRON", "RETICCTPCT" in the last 72 hours. Urine analysis:    Component Value Date/Time   COLORURINE YELLOW 01/30/2022 0258   APPEARANCEUR HAZY (A) 01/30/2022 0258   LABSPEC 1.021 01/30/2022 0258   PHURINE 6.0 01/30/2022 0258   GLUCOSEU NEGATIVE 01/30/2022 0258   HGBUR LARGE (A) 01/30/2022 0258   BILIRUBINUR NEGATIVE 01/30/2022 0258   BILIRUBINUR neg 12/03/2021 1536   KETONESUR NEGATIVE 01/30/2022 0258   PROTEINUR 30 (A) 01/30/2022 0258   UROBILINOGEN 0.2 12/03/2021 1536   NITRITE NEGATIVE 01/30/2022 0258   LEUKOCYTESUR TRACE (A) 01/30/2022 0258    Radiological Exams on Admission: I have personally reviewed images CT Cervical Spine Wo Contrast  Result Date: 01/30/2022 CLINICAL DATA:  History of recent fall. EXAM: CT CERVICAL SPINE WITHOUT CONTRAST TECHNIQUE: Multidetector CT imaging of the cervical spine  was performed without intravenous contrast. Multiplanar CT  image reconstructions were also generated. RADIATION DOSE REDUCTION: This exam was performed according to the departmental dose-optimization program which includes automated exposure control, adjustment of the mA and/or kV according to patient size and/or use of iterative reconstruction technique. COMPARISON:  June 23, 2019 FINDINGS: Alignment: There is straightening of the normal cervical spine lordosis. Stable, stable stepwise 2 mm anterolisthesis of the C2 and C3 vertebral bodies is noted. Skull base and vertebrae: No acute fracture. No primary bone lesion or focal pathologic process. Soft tissues and spinal canal: No prevertebral fluid or swelling. No visible canal hematoma. Disc levels: There is marked severity endplate sclerosis and mild anterior osteophyte formation at the levels of C4-C5 and C5-C6. There is marked severity narrowing of the anterior atlantoaxial articulation. Marked severity intervertebral disc space narrowing is seen at C4-C5 and C5-C6. Mild to moderate severity, predominant posterior intervertebral disc space narrowing is seen at C2-C3 and C3-C4. Bilateral marked severity multilevel facet joint hypertrophy is noted. Upper chest: Moderate severity linear scarring is seen within the posterior aspect of the right upper lobe. Other: None. IMPRESSION: 1. No acute fracture or traumatic malalignment of the cervical spine. 2. Marked severity multilevel degenerative changes, as described above. 3. Stable, stable, stepwise 2 mm anterolisthesis of the C2 and C3 vertebral bodies. Electronically Signed   By: Virgina Norfolk M.D.   On: 01/30/2022 01:28   CT Angio Chest PE W and/or Wo Contrast  Result Date: 01/30/2022 CLINICAL DATA:  Status post fall 3 weeks ago. EXAM: CT ANGIOGRAPHY CHEST WITH CONTRAST TECHNIQUE: Multidetector CT imaging of the chest was performed using the standard protocol during bolus administration of intravenous contrast. Multiplanar CT image reconstructions and MIPs were  obtained to evaluate the vascular anatomy. RADIATION DOSE REDUCTION: This exam was performed according to the departmental dose-optimization program which includes automated exposure control, adjustment of the mA and/or kV according to patient size and/or use of iterative reconstruction technique. CONTRAST:  2m OMNIPAQUE IOHEXOL 350 MG/ML SOLN COMPARISON:  January 29, 2022 and July 29, 2021 FINDINGS: Cardiovascular: There is marked severity calcification of the thoracic aorta, without evidence of aortic aneurysm or dissection. An artificial aortic valve is seen. Satisfactory opacification of the pulmonary arteries to the segmental level. No evidence of pulmonary embolism. Normal heart size with mild coronary artery calcification. No pericardial effusion. Mediastinum/Nodes: No enlarged mediastinal, hilar, or axillary lymph nodes. Small thyroid nodules are seen within both the right and left lobes of the thyroid gland. The trachea and esophagus demonstrate no significant findings. Lungs/Pleura: There is marked severity emphysematous lung disease. Stable mild to moderate severity right upper lobe linear scarring is again noted with mild, stable left upper lobe and posterior left basilar atelectatic changes. A stable 6 mm noncalcified lung nodule is seen within the lateral aspect of the right lower lobe. There is no evidence of acute infiltrate, pleural effusion or pneumothorax. Upper Abdomen: Multiple stable hepatic cysts are seen. Multiple surgical clips are noted within the region posterior to the left lobe of the liver. Musculoskeletal: Acute fractures are seen involving the body and manubrium of the sternum. Ill-defined deformities of indeterminate age are seen along the anterolateral aspects of the sixth and seventh right ribs. Chronic compression fracture deformities are seen involving the T8, T9 and T12 vertebral bodies. A new compression fracture deformity of the T7 vertebral body is noted. Review of the  MIP images confirms the above findings. IMPRESSION: 1. Acute fractures involving the body and manubrium  of the sternum. 2. Ill-defined deformities of indeterminate age along the anterolateral aspects of the sixth and seventh right ribs. Correlation with physical examination is recommended to determine the presence of point tenderness. 3. Chronic compression fracture deformities of the T8, T9 and T12 vertebral bodies. 4. New compression fracture deformity of the T7 vertebral body. 5. Stable 6 mm noncalcified lung nodule within the lateral aspect of the right lower lobe. Non-contrast chest CT at 6-12 months is recommended. If the nodule is stable at time of repeat CT, then future CT at 18-24 months (from today's scan) is considered optional for low-risk patients, but is recommended for high-risk patients. This recommendation follows the consensus statement: Guidelines for Management of Incidental Pulmonary Nodules Detected on CT Images: From the Fleischner Society 2017; Radiology 2017; 284:228-243. 6. Marked severity emphysematous lung disease with stable bilateral upper lobe and posterior left basilar atelectatic changes. Aortic Atherosclerosis (ICD10-I70.0) and Emphysema (ICD10-J43.9). Electronically Signed   By: Virgina Norfolk M.D.   On: 01/30/2022 01:24   CT Head Wo Contrast  Result Date: 01/30/2022 CLINICAL DATA:  Status post fall 3 weeks ago. EXAM: CT HEAD WITHOUT CONTRAST TECHNIQUE: Contiguous axial images were obtained from the base of the skull through the vertex without intravenous contrast. RADIATION DOSE REDUCTION: This exam was performed according to the departmental dose-optimization program which includes automated exposure control, adjustment of the mA and/or kV according to patient size and/or use of iterative reconstruction technique. COMPARISON:  October 10, 2021 FINDINGS: Brain: There is moderate severity cerebral atrophy with widening of the extra-axial spaces and ventricular dilatation.  There are areas of decreased attenuation within the white matter tracts of the supratentorial brain, consistent with microvascular disease changes. Vascular: No hyperdense vessel or unexpected calcification. Skull: Normal. Negative for fracture or focal lesion. Sinuses/Orbits: There is mild left maxillary sinus mucosal thickening. Other: None. IMPRESSION: 1. No acute intracranial abnormality. 2. Generalized cerebral atrophy and microvascular disease changes of the supratentorial brain. 3. Mild left maxillary sinus disease. Electronically Signed   By: Virgina Norfolk M.D.   On: 01/30/2022 01:12   CT CHEST ABDOMEN PELVIS WO CONTRAST  Result Date: 01/29/2022 CLINICAL DATA:  Status post trauma. EXAM: CT CHEST, ABDOMEN AND PELVIS WITHOUT CONTRAST TECHNIQUE: Multidetector CT imaging of the chest, abdomen and pelvis was performed following the standard protocol without IV contrast. RADIATION DOSE REDUCTION: This exam was performed according to the departmental dose-optimization program which includes automated exposure control, adjustment of the mA and/or kV according to patient size and/or use of iterative reconstruction technique. COMPARISON:  July 29, 2021 FINDINGS: CT CHEST FINDINGS Cardiovascular: There is marked severity calcification of the thoracic aorta, without evidence of aortic aneurysm. An artificial aortic valve is seen. Normal heart size with moderate severity coronary artery calcification. A trace amount of pericardial fluid is present. Mediastinum/Nodes: No enlarged mediastinal, hilar, or axillary lymph nodes. Thyroid gland, trachea, and esophagus demonstrate no significant findings. Lungs/Pleura: There is marked severity emphysematous lung disease. Stable mild to moderate severity posterior right upper lobe linear scarring is seen. Mild left upper lobe and posterior left basilar atelectasis is seen. A stable 6 mm noncalcified lung nodule is noted within the lateral aspect of the right lower lobe  (axial CT image 82, CT series 4). Musculoskeletal: Fracture deformities are seen involving the body and manubrium of the sternum. Chronic compression fracture deformities are seen at the levels of T8, T9 and T12. An additional compression fracture deformity of the T7 vertebral body is seen. This represents a  new finding when compared to the prior exam. CT ABDOMEN PELVIS FINDINGS Hepatobiliary: Multiple hepatic cysts are seen, the largest of which measures 1.5 cm and is noted within the anterior aspect of the left lobe of the liver. Multiple surgical clips are seen posterior to the left lobe of the liver. No gallstones, gallbladder wall thickening, or biliary dilatation. Pancreas: Unremarkable. No pancreatic ductal dilatation or surrounding inflammatory changes. Spleen: Normal in size without focal abnormality. Adrenals/Urinary Tract: The right adrenal gland is unremarkable. A 1.4 cm x 0.8 cm low-attenuation left adrenal mass is seen (approximately 36.71 Hounsfield units). Kidneys are normal, without renal calculi, focal lesion, or hydronephrosis. Bladder is unremarkable. Stomach/Bowel: Stomach is within normal limits. The appendix is not identified. A large amount of stool is seen within the ascending colon. No evidence of bowel wall thickening, distention, or inflammatory changes. Noninflamed diverticula are seen throughout the sigmoid colon. Vascular/Lymphatic: Aortic atherosclerosis. No enlarged abdominal or pelvic lymph nodes. Reproductive: Status post hysterectomy. No adnexal masses. Other: No abdominal wall hernia or abnormality. No abdominopelvic ascites. Musculoskeletal: A chronic pressure in fracture deformity and subsequent vertebroplasty is seen at the level of L1. Extensive postoperative changes are seen within the mid and lower lumbar spine. IMPRESSION: 1. Fracture deformities involving the body and manubrium of the sternum. 2. Chronic compression fracture deformities at the levels of T8, T9 and T12  with a new compression fracture deformity of the T7 vertebral body. 3. Stable 6 mm right lower lobe noncalcified lung nodule. 4. Marked severity emphysematous lung disease with stable mild to moderate severity posterior right upper lobe linear scarring. 5. Multiple hepatic cysts. 6. Stable low-attenuation left adrenal mass which likely represents a benign adenoma. Correlation with 1 year follow-up adrenal washout CT is recommended. This recommendation follows ACR consensus guidelines: Management of Incidental Adrenal Masses: A White Paper of the ACR Incidental Findings Committee. J Am Coll Radiol 2017;14:1038-1044. 7. Sigmoid diverticulosis. 8. Extensive postoperative changes within the mid and lower lumbar spine. Aortic Atherosclerosis (ICD10-I70.0) and Emphysema (ICD10-J43.9). Electronically Signed   By: Virgina Norfolk M.D.   On: 01/29/2022 23:20    EKG: Independently reviewed.  Sinus rhythm, PACs, QTc 506.  No acute ischemic changes.  Assessment and Plan  Acute sternal fractures Acute right sixth and seventh rib fractures without evidence of pneumothorax Acute T7 vertebral compression fracture -In the setting of recent falls -Trauma surgery consulted and recommending pain management with scheduled Tylenol, scheduled gabapentin, narcotics as needed, muscle relaxer as needed -Incentive spirometry -PT/OT eval -Fall precautions -TOC consulted for placement  Possible UTI Patient is endorsing urinary symptoms and UA with negative nitrite, trace leukocytes, and microscopy showing 11-20 WBCs and few bacteria.  No signs of sepsis at this time. -Ceftriaxone -Urine culture  Adrenal mass CT showing stable low-attenuation left adrenal mass which likely represents a benign adenoma. -Radiologist recommending 1 year follow-up adrenal washout CT.  Lung nodule CT showing stable 6 mm noncalcified lung nodule within the lateral aspect of the right lower lobe. -Will need noncontrast CT chest in 6 to 12  months for follow-up.  Hypokalemia QT prolongation -Potassium replacement, continue to monitor -Magnesium borderline low and replace -Avoid QT prolonging drugs -Repeat EKG in a.m.  Severe COPD Chronic hypoxic respiratory failure Stable, no change in oxygen requirement from baseline. -Continue home inhalers after pharmacy med rec is done. -Continue supplemental oxygen  PUD -Continue PPI after pharmacy med rec is done.  DVT prophylaxis: SCDs Code Status: Discussed with the patient, she wishes to be  full code. Family Communication: Husband at bedside. Consults called: Trauma surgery Level of care: Telemetry bed Admission status: It is my clinical opinion that referral for OBSERVATION is reasonable and necessary in this patient based on the above information provided. The aforementioned taken together are felt to place the patient at high risk for further clinical deterioration. However, it is anticipated that the patient may be medically stable for discharge from the hospital within 24 to 48 hours.   Shela Leff MD [facility]  If 7PM-7AM, please contact night-coverage [email or web address]  01/30/2022, 6:11 AM

## 2022-01-30 NOTE — Clinical Documentation Improvement (Signed)
PROGRESS NOTE    Katherine [name]  KZL:[id number] DOB: February 22, 1929 DOA: 01/29/2022 PCP: Elby Showers, MD    Chief Complaint  Patient presents with   Chest Pain    From recent injury/sternum fx    Brief Narrative:    This is a no charge note as patient was seen and admitted earlier this morning by Dr. Wandra Feinstein, patient was seen and examined, chart, imaging and labs were reviewed    Katherine [name] is a 88 y.o. female with medical history significant of severe COPD, chronic hypoxic respiratory failure, severe aortic stenosis status post TAVR, hyperlipidemia, PUD, iron deficiency anemia. Seen by PCP last month on 7/21 after a fall and x-ray revealed acute sternal fracture.  She was prescribed Norco for pain.  Seen in the ED on 7/26 for ongoing pain and was prescribed oral morphine.  PCP had also referred the patient to pain management and was prescribed tramadol.  Patient returned to the ED tonight with ongoing pain after another fall.  In the ED, stable on 2 L home oxygen.  Labs showing hemoglobin 10.9, stable.  Potassium 2.9, magnesium 1.7.  Creatinine 1.1, stable.  Phosphorus 2.4.  UA with negative nitrite, trace leukocytes, and microscopy showing 11-20 WBCs and few bacteria.  CT head negative for acute intracranial abnormality.  CT C-spine negative for acute finding.  Her CT chest significant for acute fractures involving the body and manubrium of the sternum, sixth and seventh right rib fractures, new compression fracture deformity of the T7 vertebral body, and chronic compression fracture deformity of T8, T9 and T12 vertebral bodies.  Assessment & Plan:   Principal Problem:   Closed fracture of body of sternum Active Problems:   COPD, severe (HCC)   Lung nodule   UTI (urinary tract infection)   Closed fracture of manubrium   Closed post-traumatic compression fracture of T7 thoracic vertebra    Fracture of ribs - right 6th & 7th   Adrenal mass (HCC)   Hypokalemia   QT  prolongation   Acute sternal fractures Acute right sixth and seventh rib fractures without evidence of pneumothorax Acute T7 vertebral compression fracture -In the setting of recent falls -general surgery consulted , recommending transfer here to [facility] for evaluation by trauma surgery . -Encouraged use incentive spirometer . -requested neurosurgery to look at patient imaging regarding T7 vertebral compression fracture to give their opinion per trauma surgery request . -Incentive spirometry -PT/OT eval -Fall precautions -TOC consulted for placement   UTI -She does report urinary symptoms. -Continue with Rocephin . -On urine cultures  Acute  metabolic encephalopathy -Patient is more confused than baseline per husband at bedside, most likely related to pain medications at home and UTI -No acute findings on  CT head    Adrenal mass CT showing stable low-attenuation left adrenal mass which likely represents a benign adenoma. -Radiologist recommending 1 year follow-up adrenal washout CT.   Lung nodule CT showing stable 6 mm noncalcified lung nodule within the lateral aspect of the right lower lobe. -Will need noncontrast CT chest in 6 to 12 months for follow-up.   Hypokalemia QT prolongation -Potassium replacement, continue to monitor -Magnesium borderline low and replace -Avoid QT prolonging drugs -Repeat EKG in a.m.   Severe COPD Chronic hypoxic respiratory failure Stable, no change in oxygen requirement from baseline. -Continue home inhalers after pharmacy med rec is done. -Continue supplemental oxygen   PUD -Continue PPI after pharmacy med rec is done.   DVT prophylaxis:  Freeborn heparin Code Status: Full Family Communication: D/W husband at bedside Disposition:   Status is: Inpatient    Consultants:  General surgery, recommending transfer to [facility] for surgery evaluation  Subjective:  No significant events overnight as discussed with staff,  patient herself cannot provide any complaints, she is confused per husband at bedside  Objective: Vitals:   01/30/22 0900 01/30/22 1037 01/30/22 1200 01/30/22 1400  BP: 99/86  (!) 117/57 123/62  Pulse: 84  86 88  Resp: (!) 21  18 (!) 22  Temp:  (!) 97.5 F (36.4 C)  (!) 97.4 F (36.3 C)  TempSrc:  Oral  Oral  SpO2: 100%  98% 98%    Intake/Output Summary (Last 24 hours) at 01/30/2022 1549 Last data filed at 01/30/2022 1106 Gross per 24 hour  Intake 500 ml  Output 300 ml  Net 200 ml   There were no vitals filed for this visit.  Examination:  Awake, confused, pleasant, frail Symmetrical Chest wall movement, Good air movement bilaterally, CTAB RRR,No Gallops,Rubs or new Murmurs, No Parasternal Heave +ve B.Sounds, Abd Soft, No tenderness, No rebound - guarding or rigidity. No Cyanosis, Clubbing or edema, No new Rash or bruise      Data Reviewed: I have personally reviewed following labs and imaging studies  CBC: Recent Labs  Lab 01/29/22 2306  WBC 10.3  NEUTROABS 8.6*  HGB 10.9*  HCT 34.4*  MCV 92.2  PLT 315    Basic Metabolic Panel: Recent Labs  Lab 01/29/22 2306  NA 140  K 2.9*  CL 100  CO2 31  GLUCOSE 128*  BUN 17  CREATININE 1.11*  CALCIUM 9.1  MG 1.7  PHOS 2.4*    GFR: CrCl cannot be calculated (Unknown ideal weight.).  Liver Function Tests: No results for input(s): "AST", "ALT", "ALKPHOS", "BILITOT", "PROT", "ALBUMIN" in the last 168 hours.  CBG: No results for input(s): "GLUCAP" in the last 168 hours.   No results found for this or any previous visit (from the past 240 hour(s)).       Radiology Studies: CT Cervical Spine Wo Contrast  Result Date: 01/30/2022 CLINICAL DATA:  History of recent fall. EXAM: CT CERVICAL SPINE WITHOUT CONTRAST TECHNIQUE: Multidetector CT imaging of the cervical spine was performed without intravenous contrast. Multiplanar CT image reconstructions were also generated. RADIATION DOSE REDUCTION: This exam was  performed according to the departmental dose-optimization program which includes automated exposure control, adjustment of the mA and/or kV according to patient size and/or use of iterative reconstruction technique. COMPARISON:  June 23, 2019 FINDINGS: Alignment: There is straightening of the normal cervical spine lordosis. Stable, stable stepwise 2 mm anterolisthesis of the C2 and C3 vertebral bodies is noted. Skull base and vertebrae: No acute fracture. No primary bone lesion or focal pathologic process. Soft tissues and spinal canal: No prevertebral fluid or swelling. No visible canal hematoma. Disc levels: There is marked severity endplate sclerosis and mild anterior osteophyte formation at the levels of C4-C5 and C5-C6. There is marked severity narrowing of the anterior atlantoaxial articulation. Marked severity intervertebral disc space narrowing is seen at C4-C5 and C5-C6. Mild to moderate severity, predominant posterior intervertebral disc space narrowing is seen at C2-C3 and C3-C4. Bilateral marked severity multilevel facet joint hypertrophy is noted. Upper chest: Moderate severity linear scarring is seen within the posterior aspect of the right upper lobe. Other: None. IMPRESSION: 1. No acute fracture or traumatic malalignment of the cervical spine. 2. Marked severity multilevel degenerative changes, as described above.  3. Stable, stable, stepwise 2 mm anterolisthesis of the C2 and C3 vertebral bodies. Electronically Signed   By: Virgina Norfolk M.D.   On: 01/30/2022 01:28   CT Angio Chest PE W and/or Wo Contrast  Result Date: 01/30/2022 CLINICAL DATA:  Status post fall 3 weeks ago. EXAM: CT ANGIOGRAPHY CHEST WITH CONTRAST TECHNIQUE: Multidetector CT imaging of the chest was performed using the standard protocol during bolus administration of intravenous contrast. Multiplanar CT image reconstructions and MIPs were obtained to evaluate the vascular anatomy. RADIATION DOSE REDUCTION: This exam was  performed according to the departmental dose-optimization program which includes automated exposure control, adjustment of the mA and/or kV according to patient size and/or use of iterative reconstruction technique. CONTRAST:  54m OMNIPAQUE IOHEXOL 350 MG/ML SOLN COMPARISON:  January 29, 2022 and July 29, 2021 FINDINGS: Cardiovascular: There is marked severity calcification of the thoracic aorta, without evidence of aortic aneurysm or dissection. An artificial aortic valve is seen. Satisfactory opacification of the pulmonary arteries to the segmental level. No evidence of pulmonary embolism. Normal heart size with mild coronary artery calcification. No pericardial effusion. Mediastinum/Nodes: No enlarged mediastinal, hilar, or axillary lymph nodes. Small thyroid nodules are seen within both the right and left lobes of the thyroid gland. The trachea and esophagus demonstrate no significant findings. Lungs/Pleura: There is marked severity emphysematous lung disease. Stable mild to moderate severity right upper lobe linear scarring is again noted with mild, stable left upper lobe and posterior left basilar atelectatic changes. A stable 6 mm noncalcified lung nodule is seen within the lateral aspect of the right lower lobe. There is no evidence of acute infiltrate, pleural effusion or pneumothorax. Upper Abdomen: Multiple stable hepatic cysts are seen. Multiple surgical clips are noted within the region posterior to the left lobe of the liver. Musculoskeletal: Acute fractures are seen involving the body and manubrium of the sternum. Ill-defined deformities of indeterminate age are seen along the anterolateral aspects of the sixth and seventh right ribs. Chronic compression fracture deformities are seen involving the T8, T9 and T12 vertebral bodies. A new compression fracture deformity of the T7 vertebral body is noted. Review of the MIP images confirms the above findings. IMPRESSION: 1. Acute fractures involving the  body and manubrium of the sternum. 2. Ill-defined deformities of indeterminate age along the anterolateral aspects of the sixth and seventh right ribs. Correlation with physical examination is recommended to determine the presence of point tenderness. 3. Chronic compression fracture deformities of the T8, T9 and T12 vertebral bodies. 4. New compression fracture deformity of the T7 vertebral body. 5. Stable 6 mm noncalcified lung nodule within the lateral aspect of the right lower lobe. Non-contrast chest CT at 6-12 months is recommended. If the nodule is stable at time of repeat CT, then future CT at 18-24 months (from today's scan) is considered optional for low-risk patients, but is recommended for high-risk patients. This recommendation follows the consensus statement: Guidelines for Management of Incidental Pulmonary Nodules Detected on CT Images: From the Fleischner Society 2017; Radiology 2017; 284:228-243. 6. Marked severity emphysematous lung disease with stable bilateral upper lobe and posterior left basilar atelectatic changes. Aortic Atherosclerosis (ICD10-I70.0) and Emphysema (ICD10-J43.9). Electronically Signed   By: TVirgina NorfolkM.D.   On: 01/30/2022 01:24   CT Head Wo Contrast  Result Date: 01/30/2022 CLINICAL DATA:  Status post fall 3 weeks ago. EXAM: CT HEAD WITHOUT CONTRAST TECHNIQUE: Contiguous axial images were obtained from the base of the skull through the vertex without intravenous  contrast. RADIATION DOSE REDUCTION: This exam was performed according to the departmental dose-optimization program which includes automated exposure control, adjustment of the mA and/or kV according to patient size and/or use of iterative reconstruction technique. COMPARISON:  October 10, 2021 FINDINGS: Brain: There is moderate severity cerebral atrophy with widening of the extra-axial spaces and ventricular dilatation. There are areas of decreased attenuation within the white matter tracts of the  supratentorial brain, consistent with microvascular disease changes. Vascular: No hyperdense vessel or unexpected calcification. Skull: Normal. Negative for fracture or focal lesion. Sinuses/Orbits: There is mild left maxillary sinus mucosal thickening. Other: None. IMPRESSION: 1. No acute intracranial abnormality. 2. Generalized cerebral atrophy and microvascular disease changes of the supratentorial brain. 3. Mild left maxillary sinus disease. Electronically Signed   By: Virgina Norfolk M.D.   On: 01/30/2022 01:12   CT CHEST ABDOMEN PELVIS WO CONTRAST  Result Date: 01/29/2022 CLINICAL DATA:  Status post trauma. EXAM: CT CHEST, ABDOMEN AND PELVIS WITHOUT CONTRAST TECHNIQUE: Multidetector CT imaging of the chest, abdomen and pelvis was performed following the standard protocol without IV contrast. RADIATION DOSE REDUCTION: This exam was performed according to the departmental dose-optimization program which includes automated exposure control, adjustment of the mA and/or kV according to patient size and/or use of iterative reconstruction technique. COMPARISON:  July 29, 2021 FINDINGS: CT CHEST FINDINGS Cardiovascular: There is marked severity calcification of the thoracic aorta, without evidence of aortic aneurysm. An artificial aortic valve is seen. Normal heart size with moderate severity coronary artery calcification. A trace amount of pericardial fluid is present. Mediastinum/Nodes: No enlarged mediastinal, hilar, or axillary lymph nodes. Thyroid gland, trachea, and esophagus demonstrate no significant findings. Lungs/Pleura: There is marked severity emphysematous lung disease. Stable mild to moderate severity posterior right upper lobe linear scarring is seen. Mild left upper lobe and posterior left basilar atelectasis is seen. A stable 6 mm noncalcified lung nodule is noted within the lateral aspect of the right lower lobe (axial CT image 82, CT series 4). Musculoskeletal: Fracture deformities are  seen involving the body and manubrium of the sternum. Chronic compression fracture deformities are seen at the levels of T8, T9 and T12. An additional compression fracture deformity of the T7 vertebral body is seen. This represents a new finding when compared to the prior exam. CT ABDOMEN PELVIS FINDINGS Hepatobiliary: Multiple hepatic cysts are seen, the largest of which measures 1.5 cm and is noted within the anterior aspect of the left lobe of the liver. Multiple surgical clips are seen posterior to the left lobe of the liver. No gallstones, gallbladder wall thickening, or biliary dilatation. Pancreas: Unremarkable. No pancreatic ductal dilatation or surrounding inflammatory changes. Spleen: Normal in size without focal abnormality. Adrenals/Urinary Tract: The right adrenal gland is unremarkable. A 1.4 cm x 0.8 cm low-attenuation left adrenal mass is seen (approximately 36.71 Hounsfield units). Kidneys are normal, without renal calculi, focal lesion, or hydronephrosis. Bladder is unremarkable. Stomach/Bowel: Stomach is within normal limits. The appendix is not identified. A large amount of stool is seen within the ascending colon. No evidence of bowel wall thickening, distention, or inflammatory changes. Noninflamed diverticula are seen throughout the sigmoid colon. Vascular/Lymphatic: Aortic atherosclerosis. No enlarged abdominal or pelvic lymph nodes. Reproductive: Status post hysterectomy. No adnexal masses. Other: No abdominal wall hernia or abnormality. No abdominopelvic ascites. Musculoskeletal: A chronic pressure in fracture deformity and subsequent vertebroplasty is seen at the level of L1. Extensive postoperative changes are seen within the mid and lower lumbar spine. IMPRESSION: 1. Fracture deformities  involving the body and manubrium of the sternum. 2. Chronic compression fracture deformities at the levels of T8, T9 and T12 with a new compression fracture deformity of the T7 vertebral body. 3. Stable 6  mm right lower lobe noncalcified lung nodule. 4. Marked severity emphysematous lung disease with stable mild to moderate severity posterior right upper lobe linear scarring. 5. Multiple hepatic cysts. 6. Stable low-attenuation left adrenal mass which likely represents a benign adenoma. Correlation with 1 year follow-up adrenal washout CT is recommended. This recommendation follows ACR consensus guidelines: Management of Incidental Adrenal Masses: A White Paper of the ACR Incidental Findings Committee. J Am Coll Radiol 2017;14:1038-1044. 7. Sigmoid diverticulosis. 8. Extensive postoperative changes within the mid and lower lumbar spine. Aortic Atherosclerosis (ICD10-I70.0) and Emphysema (ICD10-J43.9). Electronically Signed   By: Virgina Norfolk M.D.   On: 01/29/2022 23:20        Scheduled Meds:  acetaminophen  1,000 mg Oral Q6H   aspirin EC  81 mg Oral Daily   budesonide  0.5 mg Nebulization BID   vitamin B-12  500 mcg Oral Daily   gabapentin  100 mg Oral TID   lip balm   Topical BID   melatonin  3 mg Oral QHS   pantoprazole  40 mg Oral Daily   Continuous Infusions:  cefTRIAXone (ROCEPHIN)  IV Stopped (01/30/22 1021)   lactated ringers 75 mL/hr at 01/30/22 0551     LOS: 0 days      Phillips Climes, MD [facility]   To contact the attending provider between 7A-7P or the covering provider during after hours 7P-7A, please log into the web site [email or web address] and access using universal [place] password for that web site. If you do not have the password, please call the hospital operator.  01/30/2022, 3:49 PM

## 2022-01-30 NOTE — ED Notes (Signed)
[facility] at bedside 

## 2022-01-30 NOTE — Consult Note (Signed)
Katherine [name]  Mar 24, 1929 [id number]  CARE TEAM:  PCP: Elby Showers, MD  Outpatient Care Team: Patient Care Team: Elby Showers, MD as PCP - General (Internal Medicine) Burnell Blanks, MD as PCP - Cardiology (Cardiology) Lamonte Sakai Rose Fillers, MD as Consulting Physician (Pulmonary Disease)  Inpatient Treatment Team: Treatment Team: Attending Provider: Albertine Patricia, MD; Paramedic: Oneta Rack, EMT-P; Physician Assistant: Margarita Mail, PA-C; Technician: Sharren Bridge, NT; Consulting Physician: Md, Trauma, MD; Consulting Physician: Edison Pace, Md, MD; Rounding Team: Redmond Baseman, MD   This patient is a 88 y.o.female who presents today for surgical evaluation at the request of [facility] Margarita Mail PA.   Chief complaint / Reason for evaluation: Prior fall with worsening pain and breathing.  88 year old female.  Apparently had fall at home, hitting the arm of a reclining chair on 7/20.  He is not dependent COPD.  Currently she was trying to transfer from one chair to the other.  To follow-up with primary care office with complaints of discomfort the following day.  Mainly along the rib cage and her breastbone.  She was not hypoxic.  X-rays done at the time confirmed mid sternal fracture no definite right rib fractures but hard to say.  Pain medications recommended.  Did not improve.  Husband to emergency department 7/26.  X-rays confirmed no pulmonary contusion or pneumothorax.  Home narcotic pain regimen enforced.  Patient felt better.  Plan was to discharge home with close follow-up.  Follow-up with Dr. Renold Genta again.  However patient's pain was no longer controlled with the tramadol taking at home.  She is not been able to return to usual activity level.  Concern [facility] emergency department.  Concerned that she had evidence of sternal rib and compression spine fractures.  Pain not adequately controlled.  Trauma consult requested per internal medicine.  I was called in  the middle the night while I was doing an emergency operation.  Patient notes that she has received some medication (chart review looks like IV fentanyl) and is feeling a little better.  However she still sensitive on her right rib cage and especially her breastbone.  No nausea or vomiting.  Denies any pain in her lower nor upper extremities.  No abdominal pain.  She wondered initially if her pain was more left-sided but it is definitely more right chest wall.  Left side does not bother her so much.  Has some chronic constipation   Assessment  Katherine [name]  88 y.o. female       Problem List:  Principal Problem:   Closed fracture of body of sternum Active Problems:   COPD, severe ([place])   Closed fracture of manubrium   Closed post-traumatic compression fracture of T7 thoracic vertebra    Sternal fracture   Fracture of ribs - right 6th & 7th   Remote fall 3 weeks ago with manubrial sternal rib and spinal fractures in elderly fragile woman COPD.  Plan:  There is no evidence of any pneumonia or pneumothorax to my examination.  I since no evidence of any other injury or fracture.  Recommend multimodal pain control with scheduled Tylenol ice and heat.  Try and adjust narcotic medication.  See if muscle relaxants can be added.  Medicine admission with trauma consultation.  Recommend the patient be transferred to [facility] to trauma services to help to allow therapies to evaluate mobilize and see how functional she is and what discharge planning  can be done given her repeated ER visits and struggles.  She is not severely toxic so I do not think she needs to go to stepdown unit per se.  Will defer to trauma team coming in this morning.  -VTE prophylaxis- SCDs, etc -mobilize as tolerated to help recovery  I reviewed nursing notes, ED provider notes, Consultant internal medic notes, last 24 h vitals and pain scores, last 48 h intake and output, last 24 h labs and trends, and last  24 h imaging results. I have reviewed this patient's available data, including medical history, events of note, test results, etc as part of my evaluation.  A significant portion of that time was spent in counseling.  Care during the described time interval was provided by me.  This care required moderate level of medical decision making.  01/30/2022  Adin Hector, MD, FACS, MASCRS Esophageal, Gastrointestinal & Colorectal Surgery Robotic and Minimally Invasive Surgery  [facility] [address] [place], [place] [address] [phone] Fax [phone] Main  CONTACT INFORMATION:  Weekday (9AM-5PM): Call [facility] main office at [phone]  Weeknight (5PM-9AM) or Weekend/Holiday: Check [email or web address] (password " [email or web address]") for General Surgery [facility] coverage  (Please, do not use SecureChat as it is not reliable communication to reach operating surgeons for immediate patient care)      01/30/2022      Past Medical History:  Diagnosis Date   Allergy    Anemia    Arthritis    Asthma    patient denies   B12 deficiency    COPD (chronic obstructive pulmonary disease) ([place])    Depression    not recent   Diverticulosis    Esophageal stricture    Hepatic cyst 01/30/2010   Hiatal hernia 1985   patient unaware   Hx of adenomatous colonic polyps 2006   Hyperlipidemia    IBS (irritable bowel syndrome)    PONV (postoperative nausea and vomiting)    no nausea or vomitting after surgery 07/2014   Positive PPD    PUD (peptic ulcer disease)    S/P TAVR (transcatheter aortic valve replacement) 08/13/2021   s/p TAVR with a 80m Edwards S3UR via the right carotid approach by Dr. MAngelena Form& Dr. BCyndia Bent  Severe aortic stenosis     Past Surgical History:  Procedure Laterality Date   ABDOMINAL HYSTERECTOMY     abnormal bleeding   APPENDECTOMY     BARTHOLIN GLAND CYST EXCISION     x2   BREAST BIOPSY Bilateral    Milk glnd    CATARACT EXTRACTION W/ INTRAOCULAR LENS  IMPLANT, BILATERAL Bilateral    COLONOSCOPY     COLONOSCOPY W/ POLYPECTOMY     HARDWARE REMOVAL N/A 12/13/2015   Procedure: Removal of HARDWARE  Lumbar Five-Sacral One ;  Surgeon: GKary Kos MD;  [facility];  Service: Neurosurgery;  Laterality: N/A;   IR KYPHO LUMBAR INC FX REDUCE BONE BX UNI/BIL CANNULATION INC/IMAGING  07/13/2019   PERICARDIAL WINDOW  08/13/2021   Procedure: PERICARDIAL WINDOW;  Surgeon: MBurnell Blanks MD;  [place] [facility]  Service: Open Heart Surgery;;   RIGHT/LEFT HEART CATH AND CORONARY ANGIOGRAPHY N/A 07/11/2021   Procedure: RIGHT/LEFT HEART CATH AND CORONARY ANGIOGRAPHY;  Surgeon: MBurnell Blanks MD;  [facility];  Service: Cardiovascular;  Laterality: N/A;   TEE WITHOUT CARDIOVERSION N/A 08/13/2021   Procedure: TRANSESOPHAGEAL ECHOCARDIOGRAM (TEE);  Surgeon: MBurnell Blanks MD;  Location:  [facility];  Service: Open Heart Surgery;  Laterality: N/A;   TRANSCATHETER AORTIC VALVE REPLACEMENT, CAROTID Right 08/13/2021   Procedure: Transcatheter Aortic Valve Replacement-Carotid;  Surgeon: Burnell Blanks, MD;  Location: [place];  Service: Open Heart Surgery;  Laterality: Right;   UPPER GI ENDOSCOPY     VAGOTOMY AND PYLOROPLASTY     in [place], [place]    Social History   Socioeconomic History   Marital status: Married    Spouse name: Not on file   Number of children: 2   Years of education: Not on file   Highest education level: Not on file  Occupational History    Employer: RETIRED   Occupation: Retired-office work  Tobacco Use   Smoking status: Former    Years: 60.00    Types: Cigarettes    Quit date: 02/02/2010    Years since quitting: 12.0   Smokeless tobacco: Never  Vaping Use   Vaping Use: Never used  Substance and Sexual Activity   Alcohol use: No    Comment: quit in 1983   Drug use: No   Sexual activity: Not on file  Other Topics Concern   Not on file   Social History Narrative   Daily caffeine    Social Determinants of Health   Financial Resource Strain: Not on file  Food Insecurity: Not on file  Transportation Needs: Not on file  Physical Activity: Not on file  Stress: Not on file  Social Connections: Not on file  Intimate Partner Violence: Not on file    Family History  Problem Relation Age of Onset   Ulcers Father    Stroke Father    Irritable bowel syndrome Father    Ovarian cancer Sister    Colon cancer Neg Hx     Current Facility-Administered Medications  Medication Dose Route Frequency Provider Last Rate Last Admin   acetaminophen (TYLENOL) tablet 1,000 mg  1,000 mg Oral Q6H Michael Boston, MD   1,000 mg at 01/30/22 0553   albumin human 5 % solution 12.5 g  12.5 g Intravenous Q6H PRN Michael Boston, MD       alum & mag hydroxide-simeth (MAALOX/MYLANTA) 200-200-20 MG/5ML suspension 30 mL  30 mL Oral Q6H PRN Michael Boston, MD       bisacodyl (DULCOLAX) suppository 10 mg  10 mg Rectal Q12H PRN Michael Boston, MD       diphenhydrAMINE (BENADRYL) injection 12.5-25 mg  12.5-25 mg Intravenous Q6H PRN Michael Boston, MD       gabapentin (NEURONTIN) capsule 100 mg  100 mg Oral TID Michael Boston, MD       HYDROmorphone (DILAUDID) injection 0.5-1 mg  0.5-1 mg Intravenous Q2H PRN Michael Boston, MD       lactated ringers infusion   Intravenous Continuous Michael Boston, MD 75 mL/hr at 01/30/22 0551 New Bag at 01/30/22 0551   lip balm (CARMEX) ointment   Topical BID Michael Boston, MD   1 Application at 89/38/10 0554   magic mouthwash  15 mL Oral QID PRN Michael Boston, MD       menthol-cetylpyridinium (CEPACOL) lozenge 3 mg  1 lozenge Oral PRN Michael Boston, MD       methocarbamol (ROBAXIN) 500 mg in dextrose 5 % 50 mL IVPB  500 mg Intravenous Q6H PRN Michael Boston, MD       methocarbamol (ROBAXIN) tablet 1,000 mg  1,000 mg Oral Q6H PRN Michael Boston, MD       metoprolol tartrate (LOPRESSOR) injection 5 mg  5 mg Intravenous Q6H PRN  Michael Boston, MD       ondansetron [facility]) injection 4 mg  4 mg Intravenous Q6H PRN Michael Boston, MD       Or   ondansetron (ZOFRAN) 8 mg in sodium chloride 0.9 % 50 mL IVPB  8 mg Intravenous Q6H PRN Michael Boston, MD       oxyCODONE (Oxy IR/ROXICODONE) immediate release tablet 2.5-10 mg  2.5-10 mg Oral Q4H PRN Michael Boston, MD       phenol (CHLORASEPTIC) mouth spray 2 spray  2 spray Mouth/Throat PRN Michael Boston, MD       polyethylene glycol (MIRALAX / GLYCOLAX) packet 17 g  17 g Oral BID Michael Boston, MD       polyethylene glycol (MIRALAX / GLYCOLAX) packet 17 g  17 g Oral Q12H PRN Michael Boston, MD       potassium chloride 10 mEq in 100 mL IVPB  10 mEq Intravenous Q1 Hr x 4 Roniyah Llorens, Remo Lipps, MD   Stopped at 01/30/22 0654   potassium chloride SA (KLOR-CON M) CR tablet 40 mEq  40 mEq Oral Daily Michael Boston, MD       simethicone (MYLICON) 40 NI/6.2VO suspension 80 mg  80 mg Oral QID PRN Michael Boston, MD       Current Outpatient Medications  Medication Sig Dispense Refill   albuterol (VENTOLIN HFA) 108 (90 Base) MCG/ACT inhaler INHALE 2 PUFFS INTO THE LUNGS 4 (FOUR) TIMES DAILY AS NEEDED FOR WHEEZING OR SHORTNESS OF BREATH. (Patient taking differently: Inhale 2 puffs into the lungs every 4 (four) hours as needed for wheezing or shortness of breath.) 36 each 6   ALPRAZolam (XANAX) 0.25 MG tablet TAKE ONE TAB UP TO TWICE DAILY AS NEEDED FOR ANXIETY AND SLEEP 60 tablet 2   aspirin 81 MG chewable tablet Chew 1 tablet (81 mg total) by mouth daily.     budesonide (PULMICORT) 0.5 MG/2ML nebulizer solution TAKE 2 MLS (0.5 MG TOTAL) BY NEBULIZATION 2 (TWO) TIMES DAILY. 120 mL 5   Cyanocobalamin (VITAMIN B-12 PO) Take 1 tablet by mouth daily.     diclofenac Sodium (VOLTAREN) 1 % GEL Apply 4 g topically 4 (four) times daily. 100 g 0   escitalopram (LEXAPRO) 5 MG tablet TAKE 1 TABLET BY MOUTH EVERY DAY (Patient taking differently: Take 5 mg by mouth at bedtime.) 90 tablet 2   fluticasone (FLONASE) 50  MCG/ACT nasal spray Place 1 spray into both nostrils daily. 18.2 mL 2   HYDROcodone-acetaminophen (NORCO) 10-325 MG tablet One half to one tab every 8 hours as needed for pain 15 tablet 0   ipratropium (ATROVENT) 0.02 % nebulizer solution TAKE 2.5 MLS (0.5 MG TOTAL) BY NEBULIZATION 4 (FOUR) TIMES DAILY. (Patient taking differently: Take 0.5 mg by nebulization every 6 (six) hours as needed for wheezing or shortness of breath.) 62.5 mL 12   iron polysaccharides (NIFEREX) 150 MG capsule TAKE 1 CAPSULE BY MOUTH TWICE A DAY 180 capsule 1   loperamide (IMODIUM) 2 MG capsule Take 1 capsule (2 mg total) by mouth 4 (four) times daily as needed for diarrhea or loose stools. 20 capsule 0   Melatonin 10 MG CAPS Take 10 mg by mouth at bedtime.     morphine (MSIR) 15 MG tablet Take 0.5 tablets (7.5 mg total) by mouth every 4 (four) hours as needed for severe pain. 4 tablet 0   Multiple Vitamins-Minerals (MULTIVITAMIN WITH MINERALS) tablet Take 1 tablet by mouth daily.  pantoprazole (PROTONIX) 40 MG tablet TAKE 1 TABLET BY MOUTH EVERY DAY 90 tablet 3   promethazine (PHENERGAN) 12.5 MG tablet TAKE 1 TABLET (12.5 MG TOTAL) BY MOUTH EVERY 8 (EIGHT) HOURS AS NEEDED FOR NAUSEA OR VOMITING. 30 tablet 3   traMADol (ULTRAM) 50 MG tablet Take 50 mg by mouth every 6 (six) hours as needed for moderate pain.      traZODone (DESYREL) 50 MG tablet TAKE 3 TABLETS BY MOUTH AT BEDTIME 270 tablet 0     No Known Allergies  ROS:   All other systems reviewed & are negative except per HPI or as noted below: Constitutional:  No fevers, chills, sweats.  Weight stable Eyes:  No vision changes, No discharge HENT:  No sore throats, nasal drainage Lymph: No neck swelling, No bruising easily Pulmonary:  No cough, productive sputum CV: No orthopnea, PND  Patient walks 1 block without difficulty.  No exertional chest/neck/shoulder/arm pain.  GI:  No personal nor family history of GI/colon cancer, inflammatory bowel disease, irritable  bowel syndrome, allergy such as Celiac Sprue, dietary/dairy problems, colitis, ulcers nor gastritis.  No recent sick contacts/gastroenteritis.  No travel outside the country.  No changes in diet.  Renal: No UTIs, No hematuria Genital:  No drainage, bleeding, masses Musculoskeletal: No severe joint pain.  Good ROM major joints Skin:  No sores or lesions Heme/Lymph:  No easy bleeding.  No swollen lymph nodes   BP (!) 156/86   Pulse 85   Temp 98.5 F (36.9 C) (Oral)   Resp (!) 29   SpO2 100%   Physical Exam:  Constitutional: Not cachectic.  Hygeine adequate.  Vitals signs as above.   Eyes: Pupils reactive, normal extraocular movements. Sclera nonicteric Neuro: CN II-XII intact.  No major focal sensory defects.  No major motor deficits. Lymph: No head/neck/groin lymphadenopathy Psych:  No severe agitation.  No severe anxiety.  Judgment & insight Adequate, Oriented x4, mentally quite alert and sharp.  No delay in answering or stuttering. HENT: Normocephalic, Mucus membranes moist.  No thrush.   Neck: Supple, No tracheal deviation.  No obvious thyromegaly  Chest: Discomfort on manubrium sternum without any obvious click.  Discomfort on lower right anterior abdominal wall along anterior axial line at [facility] to correlate with probable rib fractures at level 6 and 7.  No pain on the left rib cage.  No conversational dyspnea.  No wheezing nor crepitus.  No flail segment.  No pain to chest wall compression.  Good respiratory excursion.  No audible wheezing  CV:  Pulses intact.  regular rhythm.  No major extremity edema  Abdomen:  Flat Hernia: Not present. Diastasis recti: Not present. Soft.   Nondistended.  Nontender.  No hepatomegaly.  No splenomegaly  Gen: Pelvis stable inguinal hernia: Not present.  Inguinal lymph nodes: without lymphadenopathy.    Rectal: (Deferred)  Ext: No obvious deformity or contracture.  Edema: Not present.  No cyanosis Skin: No major subcutaneous nodules.  Warm and  dry Musculoskeletal: Normal active and passive range of motion at wrists elbows shoulders as well as ankles hips and knees.  No deformity noted in upper nor lower extremity.  Severe joint rigidity not present.  No obvious clubbing.  No digital petechiae.     Results:   Labs: Results for orders placed or performed during the hospital encounter of 01/29/22 (from the past 48 hour(s))  CBC with Differential     Status: Abnormal   Collection Time: 01/29/22 11:06 PM  Result Value Ref  Range   WBC 10.3 4.0 - 10.5 K/uL   RBC 3.73 (L) 3.87 - 5.11 MIL/uL   Hemoglobin 10.9 (L) 12.0 - 15.0 g/dL   HCT 34.4 (L) 36.0 - 46.0 %   MCV 92.2 80.0 - 100.0 fL   MCH 29.2 26.0 - 34.0 pg   MCHC 31.7 30.0 - 36.0 g/dL   RDW 13.0 11.5 - 15.5 %   Platelets 169 150 - 400 K/uL   nRBC 0.0 0.0 - 0.2 %   Neutrophils Relative % 83 %   Neutro Abs 8.6 (H) 1.7 - 7.7 K/uL   Lymphocytes Relative 6 %   Lymphs Abs 0.6 (L) 0.7 - 4.0 K/uL   Monocytes Relative 10 %   Monocytes Absolute 1.0 0.1 - 1.0 K/uL   Eosinophils Relative 0 %   Eosinophils Absolute 0.0 0.0 - 0.5 K/uL   Basophils Relative 0 %   Basophils Absolute 0.0 0.0 - 0.1 K/uL   Immature Granulocytes 1 %   Abs Immature Granulocytes 0.05 0.00 - 0.07 K/uL    Comment: Performed at [facility], [place] [address], [place], [place] [address]  Basic metabolic panel     Status: Abnormal   Collection Time: 01/29/22 11:06 PM  Result Value Ref Range   Sodium 140 135 - 145 mmol/L   Potassium 2.9 (L) 3.5 - 5.1 mmol/L   Chloride 100 98 - 111 mmol/L   CO2 31 22 - 32 mmol/L   Glucose, Bld 128 (H) 70 - 99 mg/dL    Comment: Glucose reference range applies only to samples taken after fasting for at least 8 hours.   BUN 17 8 - 23 mg/dL   Creatinine, Ser 1.11 (H) 0.44 - 1.00 mg/dL   Calcium 9.1 8.9 - 10.3 mg/dL   GFR, Estimated 47 (L) >60 mL/min    Comment: (NOTE) Calculated using the CKD-EPI Creatinine Equation (2021)    Anion gap 9 5 - 15    Comment:  Performed at [facility], [place] [address], [place], [place] [address]  Magnesium     Status: None   Collection Time: 01/29/22 11:06 PM  Result Value Ref Range   Magnesium 1.7 1.7 - 2.4 mg/dL    Comment: Performed at [facility], [place] [address], [place], [place] [address]  Phosphorus     Status: Abnormal   Collection Time: 01/29/22 11:06 PM  Result Value Ref Range   Phosphorus 2.4 (L) 2.5 - 4.6 mg/dL    Comment: Performed at [facility], [place] [address] [place], [place] [address]  Urinalysis, Routine w reflex microscopic Urine, Clean Catch     Status: Abnormal   Collection Time: 01/30/22  2:58 AM  Result Value Ref Range   Color, Urine YELLOW YELLOW   APPearance HAZY (A) CLEAR   Specific Gravity, Urine 1.021 1.005 - 1.030   pH 6.0 5.0 - 8.0   Glucose, UA NEGATIVE NEGATIVE mg/dL   Hgb urine dipstick LARGE (A) NEGATIVE   Bilirubin Urine NEGATIVE NEGATIVE   Ketones, ur NEGATIVE NEGATIVE mg/dL   Protein, ur 30 (A) NEGATIVE mg/dL   Nitrite NEGATIVE NEGATIVE   Leukocytes,Ua TRACE (A) NEGATIVE   RBC / HPF 21-50 0 - 5 RBC/hpf   WBC, UA 11-20 0 - 5 WBC/hpf   Bacteria, UA FEW (A) NONE SEEN   Squamous Epithelial / LPF 0-5 0 - 5   Mucus PRESENT     Comment: Performed at [facility], [place]  [address] [place],  [address]    Imaging / Studies: CT Cervical Spine Wo Contrast  Result Date: 01/30/2022 CLINICAL DATA:  History of recent fall. EXAM: CT CERVICAL SPINE WITHOUT CONTRAST TECHNIQUE: Multidetector CT imaging of the cervical spine was performed without intravenous contrast. Multiplanar CT image reconstructions were also generated. RADIATION DOSE REDUCTION: This exam was performed according to the departmental dose-optimization program which includes automated exposure control, adjustment of the mA and/or kV according to patient size and/or use of iterative reconstruction technique. COMPARISON:  June 23, 2019  FINDINGS: Alignment: There is straightening of the normal cervical spine lordosis. Stable, stable stepwise 2 mm anterolisthesis of the C2 and C3 vertebral bodies is noted. Skull base and vertebrae: No acute fracture. No primary bone lesion or focal pathologic process. Soft tissues and spinal canal: No prevertebral fluid or swelling. No visible canal hematoma. Disc levels: There is marked severity endplate sclerosis and mild anterior osteophyte formation at the levels of C4-C5 and C5-C6. There is marked severity narrowing of the anterior atlantoaxial articulation. Marked severity intervertebral disc space narrowing is seen at C4-C5 and C5-C6. Mild to moderate severity, predominant posterior intervertebral disc space narrowing is seen at C2-C3 and C3-C4. Bilateral marked severity multilevel facet joint hypertrophy is noted. Upper chest: Moderate severity linear scarring is seen within the posterior aspect of the right upper lobe. Other: None. IMPRESSION: 1. No acute fracture or traumatic malalignment of the cervical spine. 2. Marked severity multilevel degenerative changes, as described above. 3. Stable, stable, stepwise 2 mm anterolisthesis of the C2 and C3 vertebral bodies. Electronically Signed   By: Virgina Norfolk M.D.   On: 01/30/2022 01:28   CT Angio Chest PE W and/or Wo Contrast  Result Date: 01/30/2022 CLINICAL DATA:  Status post fall 3 weeks ago. EXAM: CT ANGIOGRAPHY CHEST WITH CONTRAST TECHNIQUE: Multidetector CT imaging of the chest was performed using the standard protocol during bolus administration of intravenous contrast. Multiplanar CT image reconstructions and MIPs were obtained to evaluate the vascular anatomy. RADIATION DOSE REDUCTION: This exam was performed according to the departmental dose-optimization program which includes automated exposure control, adjustment of the mA and/or kV according to patient size and/or use of iterative reconstruction technique. CONTRAST:  26m OMNIPAQUE  IOHEXOL 350 MG/ML SOLN COMPARISON:  January 29, 2022 and July 29, 2021 FINDINGS: Cardiovascular: There is marked severity calcification of the thoracic aorta, without evidence of aortic aneurysm or dissection. An artificial aortic valve is seen. Satisfactory opacification of the pulmonary arteries to the segmental level. No evidence of pulmonary embolism. Normal heart size with mild coronary artery calcification. No pericardial effusion. Mediastinum/Nodes: No enlarged mediastinal, hilar, or axillary lymph nodes. Small thyroid nodules are seen within both the right and left lobes of the thyroid gland. The trachea and esophagus demonstrate no significant findings. Lungs/Pleura: There is marked severity emphysematous lung disease. Stable mild to moderate severity right upper lobe linear scarring is again noted with mild, stable left upper lobe and posterior left basilar atelectatic changes. A stable 6 mm noncalcified lung nodule is seen within the lateral aspect of the right lower lobe. There is no evidence of acute infiltrate, pleural effusion or pneumothorax. Upper Abdomen: Multiple stable hepatic cysts are seen. Multiple surgical clips are noted within the region posterior to the left lobe of the liver. Musculoskeletal: Acute fractures are seen involving the body and manubrium of the sternum. Ill-defined deformities of indeterminate age are seen along the anterolateral aspects of the sixth and seventh right ribs. Chronic compression fracture deformities  are seen involving the T8, T9 and T12 vertebral bodies. A new compression fracture deformity of the T7 vertebral body is noted. Review of the MIP images confirms the above findings. IMPRESSION: 1. Acute fractures involving the body and manubrium of the sternum. 2. Ill-defined deformities of indeterminate age along the anterolateral aspects of the sixth and seventh right ribs. Correlation with physical examination is recommended to determine the presence of point  tenderness. 3. Chronic compression fracture deformities of the T8, T9 and T12 vertebral bodies. 4. New compression fracture deformity of the T7 vertebral body. 5. Stable 6 mm noncalcified lung nodule within the lateral aspect of the right lower lobe. Non-contrast chest CT at 6-12 months is recommended. If the nodule is stable at time of repeat CT, then future CT at 18-24 months (from today's scan) is considered optional for low-risk patients, but is recommended for high-risk patients. This recommendation follows the consensus statement: Guidelines for Management of Incidental Pulmonary Nodules Detected on CT Images: From the Fleischner Society 2017; Radiology 2017; 284:228-243. 6. Marked severity emphysematous lung disease with stable bilateral upper lobe and posterior left basilar atelectatic changes. Aortic Atherosclerosis (ICD10-I70.0) and Emphysema (ICD10-J43.9). Electronically Signed   By: Virgina Norfolk M.D.   On: 01/30/2022 01:24   CT Head Wo Contrast  Result Date: 01/30/2022 CLINICAL DATA:  Status post fall 3 weeks ago. EXAM: CT HEAD WITHOUT CONTRAST TECHNIQUE: Contiguous axial images were obtained from the base of the skull through the vertex without intravenous contrast. RADIATION DOSE REDUCTION: This exam was performed according to the departmental dose-optimization program which includes automated exposure control, adjustment of the mA and/or kV according to patient size and/or use of iterative reconstruction technique. COMPARISON:  October 10, 2021 FINDINGS: Brain: There is moderate severity cerebral atrophy with widening of the extra-axial spaces and ventricular dilatation. There are areas of decreased attenuation within the white matter tracts of the supratentorial brain, consistent with microvascular disease changes. Vascular: No hyperdense vessel or unexpected calcification. Skull: Normal. Negative for fracture or focal lesion. Sinuses/Orbits: There is mild left maxillary sinus mucosal  thickening. Other: None. IMPRESSION: 1. No acute intracranial abnormality. 2. Generalized cerebral atrophy and microvascular disease changes of the supratentorial brain. 3. Mild left maxillary sinus disease. Electronically Signed   By: Virgina Norfolk M.D.   On: 01/30/2022 01:12   CT CHEST ABDOMEN PELVIS WO CONTRAST  Result Date: 01/29/2022 CLINICAL DATA:  Status post trauma. EXAM: CT CHEST, ABDOMEN AND PELVIS WITHOUT CONTRAST TECHNIQUE: Multidetector CT imaging of the chest, abdomen and pelvis was performed following the standard protocol without IV contrast. RADIATION DOSE REDUCTION: This exam was performed according to the departmental dose-optimization program which includes automated exposure control, adjustment of the mA and/or kV according to patient size and/or use of iterative reconstruction technique. COMPARISON:  July 29, 2021 FINDINGS: CT CHEST FINDINGS Cardiovascular: There is marked severity calcification of the thoracic aorta, without evidence of aortic aneurysm. An artificial aortic valve is seen. Normal heart size with moderate severity coronary artery calcification. A trace amount of pericardial fluid is present. Mediastinum/Nodes: No enlarged mediastinal, hilar, or axillary lymph nodes. Thyroid gland, trachea, and esophagus demonstrate no significant findings. Lungs/Pleura: There is marked severity emphysematous lung disease. Stable mild to moderate severity posterior right upper lobe linear scarring is seen. Mild left upper lobe and posterior left basilar atelectasis is seen. A stable 6 mm noncalcified lung nodule is noted within the lateral aspect of the right lower lobe (axial CT image 82, CT series 4). Musculoskeletal: Fracture  deformities are seen involving the body and manubrium of the sternum. Chronic compression fracture deformities are seen at the levels of T8, T9 and T12. An additional compression fracture deformity of the T7 vertebral body is seen. This represents a new  finding when compared to the prior exam. CT ABDOMEN PELVIS FINDINGS Hepatobiliary: Multiple hepatic cysts are seen, the largest of which measures 1.5 cm and is noted within the anterior aspect of the left lobe of the liver. Multiple surgical clips are seen posterior to the left lobe of the liver. No gallstones, gallbladder wall thickening, or biliary dilatation. Pancreas: Unremarkable. No pancreatic ductal dilatation or surrounding inflammatory changes. Spleen: Normal in size without focal abnormality. Adrenals/Urinary Tract: The right adrenal gland is unremarkable. A 1.4 cm x 0.8 cm low-attenuation left adrenal mass is seen (approximately 36.71 Hounsfield units). Kidneys are normal, without renal calculi, focal lesion, or hydronephrosis. Bladder is unremarkable. Stomach/Bowel: Stomach is within normal limits. The appendix is not identified. A large amount of stool is seen within the ascending colon. No evidence of bowel wall thickening, distention, or inflammatory changes. Noninflamed diverticula are seen throughout the sigmoid colon. Vascular/Lymphatic: Aortic atherosclerosis. No enlarged abdominal or pelvic lymph nodes. Reproductive: Status post hysterectomy. No adnexal masses. Other: No abdominal wall hernia or abnormality. No abdominopelvic ascites. Musculoskeletal: A chronic pressure in fracture deformity and subsequent vertebroplasty is seen at the level of L1. Extensive postoperative changes are seen within the mid and lower lumbar spine. IMPRESSION: 1. Fracture deformities involving the body and manubrium of the sternum. 2. Chronic compression fracture deformities at the levels of T8, T9 and T12 with a new compression fracture deformity of the T7 vertebral body. 3. Stable 6 mm right lower lobe noncalcified lung nodule. 4. Marked severity emphysematous lung disease with stable mild to moderate severity posterior right upper lobe linear scarring. 5. Multiple hepatic cysts. 6. Stable low-attenuation left  adrenal mass which likely represents a benign adenoma. Correlation with 1 year follow-up adrenal washout CT is recommended. This recommendation follows ACR consensus guidelines: Management of Incidental Adrenal Masses: A White Paper of the ACR Incidental Findings Committee. J Am Coll Radiol 2017;14:1038-1044. 7. Sigmoid diverticulosis. 8. Extensive postoperative changes within the mid and lower lumbar spine. Aortic Atherosclerosis (ICD10-I70.0) and Emphysema (ICD10-J43.9). Electronically Signed   By: Virgina Norfolk M.D.   On: 01/29/2022 23:20   DG Chest 2 View  Result Date: 01/08/2022 CLINICAL DATA:  Chest pain, fatigue EXAM: CHEST - 2 VIEW COMPARISON:  01/03/2022 FINDINGS: Heart is normal size. Aortic atherosclerosis. Emphysema. No confluent opacities or effusions. No acute bony abnormality. Prior aortic valve repair. IMPRESSION: Emphysema. No acute cardiopulmonary disease. Electronically Signed   By: Rolm Baptise M.D.   On: 01/08/2022 15:46   DG Ribs Unilateral Right  Result Date: 01/03/2022 CLINICAL DATA:  Status post fall while at home. EXAM: RIGHT RIBS - 2 VIEW COMPARISON:  10/10/2021 FINDINGS: No fracture or other bone lesions are seen involving the ribs. IMPRESSION: Negative. Electronically Signed   By: Kerby Moors M.D.   On: 01/03/2022 17:05   DG Sternum  Result Date: 01/03/2022 CLINICAL DATA:  [place] at home with right-sided chest pain. EXAM: STERNUM - 2+ VIEW COMPARISON:  Chest 10/10/2021, 08/09/2021 FINDINGS: Cortical irregularity and deformity of the mid sternum, new since prior chest radiograph from 08/09/2021. given the history of trauma, this likely represents a sternal fracture. No bone destruction or expansile lesion is identified. Heart size and pulmonary vascularity are normal. Calcified and tortuous aorta. Cardiac valve prosthesis. Emphysematous  changes and fibrosis in the lungs. Mediastinal contours appear intact. IMPRESSION: Cortical irregularity and deformity of the mid  sternum, likely acute sternal fracture. Electronically Signed   By: Lucienne Capers M.D.   On: 01/03/2022 17:05    Medications / Allergies: per chart  Antibiotics: Anti-infectives (From admission, onward)    None         Note: Portions of this report may have been transcribed using voice recognition software. Every effort was made to ensure accuracy; however, inadvertent computerized transcription errors may be present.   Any transcriptional errors that result from this process are unintentional.    Adin Hector, MD, FACS, MASCRS Esophageal, Gastrointestinal & Colorectal Surgery Robotic and Minimally Invasive Surgery  [facility]. [address] [place], [place] [address] [phone] Fax [phone] Main  CONTACT INFORMATION:  Weekday (9AM-5PM): Call [facility] main office at [phone]  Weeknight (5PM-9AM) or Weekend/Holiday: Check [email or web address] (password " [email or web address]") for General Surgery [facility] coverage  (Please, do not use SecureChat as it is not reliable communication to reach operating surgeons for immediate patient care)       01/30/2022  6:54 AM

## 2022-01-30 NOTE — Progress Notes (Addendum)
PROGRESS NOTE    Katherine [name]  DXA:[id number] DOB: 10/31/1928 DOA: 01/29/2022 PCP: Elby Showers, MD    Chief Complaint  Patient presents with   Chest Pain    From recent injury/sternum fx    Brief Narrative:    This is a no charge note as patient was seen and admitted earlier this morning by Dr. Wandra Feinstein, patient was seen and examined, chart, imaging and labs were reviewed    Katherine [name] is a 88 y.o. female with medical history significant of severe COPD, chronic hypoxic respiratory failure, severe aortic stenosis status post TAVR, hyperlipidemia, PUD, iron deficiency anemia. Seen by PCP last month on 7/21 after a fall and x-ray revealed acute sternal fracture.  She was prescribed Norco for pain.  Seen in the ED on 7/26 for ongoing pain and was prescribed oral morphine.  PCP had also referred the patient to pain management and was prescribed tramadol.  Patient returned to the ED tonight with ongoing pain after another fall.  In the ED, stable on 2 L home oxygen.  Labs showing hemoglobin 10.9, stable.  Potassium 2.9, magnesium 1.7.  Creatinine 1.1, stable.  Phosphorus 2.4.  UA with negative nitrite, trace leukocytes, and microscopy showing 11-20 WBCs and few bacteria.  CT head negative for acute intracranial abnormality.  CT C-spine negative for acute finding.  Her CT chest significant for acute fractures involving the body and manubrium of the sternum, sixth and seventh right rib fractures, new compression fracture deformity of the T7 vertebral body, and chronic compression fracture deformity of T8, T9 and T12 vertebral bodies.  Assessment & Plan:   Principal Problem:   Closed fracture of body of sternum Active Problems:   COPD, severe (HCC)   Lung nodule   UTI (urinary tract infection)   Closed fracture of manubrium   Closed post-traumatic compression fracture of T7 thoracic vertebra    Fracture of ribs - right 6th & 7th   Adrenal mass (HCC)   Hypokalemia   QT  prolongation   Acute sternal fractures Acute right sixth and seventh rib fractures without evidence of pneumothorax Acute T7 vertebral compression fracture -In the setting of recent falls -general surgery consulted , recommending transfer here to [facility] for evaluation by trauma surgery . -Encouraged use incentive spirometer . -requested neurosurgery to look at patient imaging regarding T7 vertebral compression fracture to give their opinion per trauma surgery request .>>  Imaging were reviewed by Dr. Kathyrn Sheriff, no need for surgical intervention, and fracture is too high for a brace, can follow as an outpatient with neurosurgery in few weeks if still having severe pain -Incentive spirometry -PT/OT eval -Fall precautions -TOC consulted for placement   UTI -She does report urinary symptoms. -Continue with Rocephin . -On urine cultures  Acute  metabolic encephalopathy -Patient is more confused than baseline per husband at bedside, most likely related to pain medications at home and UTI -No acute findings on  CT head    Adrenal mass CT showing stable low-attenuation left adrenal mass which likely represents a benign adenoma. -Radiologist recommending 1 year follow-up adrenal washout CT.   Lung nodule CT showing stable 6 mm noncalcified lung nodule within the lateral aspect of the right lower lobe. -Will need noncontrast CT chest in 6 to 12 months for follow-up.   Hypokalemia QT prolongation -Potassium replacement, continue to monitor -Magnesium borderline low and replace -Avoid QT prolonging drugs -Repeat EKG in a.m.   Severe COPD Chronic hypoxic respiratory failure  Stable, no change in oxygen requirement from baseline. -Continue home inhalers after pharmacy med rec is done. -Continue supplemental oxygen   PUD -Continue PPI after pharmacy med rec is done.   DVT prophylaxis: [place] heparin Code Status: Full Family Communication: D/W husband at  bedside Disposition:   Status is: Inpatient    Consultants:  General surgery, recommending transfer to [facility] for surgery evaluation  Subjective:  No significant events overnight as discussed with staff, patient herself cannot provide any complaints, she is confused per husband at bedside  Objective: Vitals:   01/30/22 0900 01/30/22 1037 01/30/22 1200 01/30/22 1400  BP: 99/86  (!) 117/57 123/62  Pulse: 84  86 88  Resp: (!) 21  18 (!) 22  Temp:  (!) 97.5 F (36.4 C)  (!) 97.4 F (36.3 C)  TempSrc:  Oral  Oral  SpO2: 100%  98% 98%    Intake/Output Summary (Last 24 hours) at 01/30/2022 1602 Last data filed at 01/30/2022 1106 Gross per 24 hour  Intake 500 ml  Output 300 ml  Net 200 ml    There were no vitals filed for this visit.  Examination:  Awake, confused, pleasant, frail Symmetrical Chest wall movement, Good air movement bilaterally, CTAB RRR,No Gallops,Rubs or new Murmurs, No Parasternal Heave +ve B.Sounds, Abd Soft, No tenderness, No rebound - guarding or rigidity. No Cyanosis, Clubbing or edema, No new Rash or bruise      Data Reviewed: I have personally reviewed following labs and imaging studies  CBC: Recent Labs  Lab 01/29/22 2306  WBC 10.3  NEUTROABS 8.6*  HGB 10.9*  HCT 34.4*  MCV 92.2  PLT 169     Basic Metabolic Panel: Recent Labs  Lab 01/29/22 2306  NA 140  K 2.9*  CL 100  CO2 31  GLUCOSE 128*  BUN 17  CREATININE 1.11*  CALCIUM 9.1  MG 1.7  PHOS 2.4*     GFR: CrCl cannot be calculated (Unknown ideal weight.).  Liver Function Tests: No results for input(s): "AST", "ALT", "ALKPHOS", "BILITOT", "PROT", "ALBUMIN" in the last 168 hours.  CBG: No results for input(s): "GLUCAP" in the last 168 hours.   No results found for this or any previous visit (from the past 240 hour(s)).       Radiology Studies: CT Cervical Spine Wo Contrast  Result Date: 01/30/2022 CLINICAL DATA:  History of recent fall. EXAM: CT CERVICAL  SPINE WITHOUT CONTRAST TECHNIQUE: Multidetector CT imaging of the cervical spine was performed without intravenous contrast. Multiplanar CT image reconstructions were also generated. RADIATION DOSE REDUCTION: This exam was performed according to the departmental dose-optimization program which includes automated exposure control, adjustment of the mA and/or kV according to patient size and/or use of iterative reconstruction technique. COMPARISON:  June 23, 2019 FINDINGS: Alignment: There is straightening of the normal cervical spine lordosis. Stable, stable stepwise 2 mm anterolisthesis of the C2 and C3 vertebral bodies is noted. Skull base and vertebrae: No acute fracture. No primary bone lesion or focal pathologic process. Soft tissues and spinal canal: No prevertebral fluid or swelling. No visible canal hematoma. Disc levels: There is marked severity endplate sclerosis and mild anterior osteophyte formation at the levels of C4-C5 and C5-C6. There is marked severity narrowing of the anterior atlantoaxial articulation. Marked severity intervertebral disc space narrowing is seen at C4-C5 and C5-C6. Mild to moderate severity, predominant posterior intervertebral disc space narrowing is seen at C2-C3 and C3-C4. Bilateral marked severity multilevel facet joint hypertrophy is noted. Upper chest:  Moderate severity linear scarring is seen within the posterior aspect of the right upper lobe. Other: None. IMPRESSION: 1. No acute fracture or traumatic malalignment of the cervical spine. 2. Marked severity multilevel degenerative changes, as described above. 3. Stable, stable, stepwise 2 mm anterolisthesis of the C2 and C3 vertebral bodies. Electronically Signed   By: Virgina Norfolk M.D.   On: 01/30/2022 01:28   CT Angio Chest PE W and/or Wo Contrast  Result Date: 01/30/2022 CLINICAL DATA:  Status post fall 3 weeks ago. EXAM: CT ANGIOGRAPHY CHEST WITH CONTRAST TECHNIQUE: Multidetector CT imaging of the chest was  performed using the standard protocol during bolus administration of intravenous contrast. Multiplanar CT image reconstructions and MIPs were obtained to evaluate the vascular anatomy. RADIATION DOSE REDUCTION: This exam was performed according to the departmental dose-optimization program which includes automated exposure control, adjustment of the mA and/or kV according to patient size and/or use of iterative reconstruction technique. CONTRAST:  40m OMNIPAQUE IOHEXOL 350 MG/ML SOLN COMPARISON:  January 29, 2022 and July 29, 2021 FINDINGS: Cardiovascular: There is marked severity calcification of the thoracic aorta, without evidence of aortic aneurysm or dissection. An artificial aortic valve is seen. Satisfactory opacification of the pulmonary arteries to the segmental level. No evidence of pulmonary embolism. Normal heart size with mild coronary artery calcification. No pericardial effusion. Mediastinum/Nodes: No enlarged mediastinal, hilar, or axillary lymph nodes. Small thyroid nodules are seen within both the right and left lobes of the thyroid gland. The trachea and esophagus demonstrate no significant findings. Lungs/Pleura: There is marked severity emphysematous lung disease. Stable mild to moderate severity right upper lobe linear scarring is again noted with mild, stable left upper lobe and posterior left basilar atelectatic changes. A stable 6 mm noncalcified lung nodule is seen within the lateral aspect of the right lower lobe. There is no evidence of acute infiltrate, pleural effusion or pneumothorax. Upper Abdomen: Multiple stable hepatic cysts are seen. Multiple surgical clips are noted within the region posterior to the left lobe of the liver. Musculoskeletal: Acute fractures are seen involving the body and manubrium of the sternum. Ill-defined deformities of indeterminate age are seen along the anterolateral aspects of the sixth and seventh right ribs. Chronic compression fracture deformities  are seen involving the T8, T9 and T12 vertebral bodies. A new compression fracture deformity of the T7 vertebral body is noted. Review of the MIP images confirms the above findings. IMPRESSION: 1. Acute fractures involving the body and manubrium of the sternum. 2. Ill-defined deformities of indeterminate age along the anterolateral aspects of the sixth and seventh right ribs. Correlation with physical examination is recommended to determine the presence of point tenderness. 3. Chronic compression fracture deformities of the T8, T9 and T12 vertebral bodies. 4. New compression fracture deformity of the T7 vertebral body. 5. Stable 6 mm noncalcified lung nodule within the lateral aspect of the right lower lobe. Non-contrast chest CT at 6-12 months is recommended. If the nodule is stable at time of repeat CT, then future CT at 18-24 months (from today's scan) is considered optional for low-risk patients, but is recommended for high-risk patients. This recommendation follows the consensus statement: Guidelines for Management of Incidental Pulmonary Nodules Detected on CT Images: From the Fleischner Society 2017; Radiology 2017; 284:228-243. 6. Marked severity emphysematous lung disease with stable bilateral upper lobe and posterior left basilar atelectatic changes. Aortic Atherosclerosis (ICD10-I70.0) and Emphysema (ICD10-J43.9). Electronically Signed   By: TVirgina NorfolkM.D.   On: 01/30/2022 01:24   CT  Head Wo Contrast  Result Date: 01/30/2022 CLINICAL DATA:  Status post fall 3 weeks ago. EXAM: CT HEAD WITHOUT CONTRAST TECHNIQUE: Contiguous axial images were obtained from the base of the skull through the vertex without intravenous contrast. RADIATION DOSE REDUCTION: This exam was performed according to the departmental dose-optimization program which includes automated exposure control, adjustment of the mA and/or kV according to patient size and/or use of iterative reconstruction technique. COMPARISON:  October 10, 2021 FINDINGS: Brain: There is moderate severity cerebral atrophy with widening of the extra-axial spaces and ventricular dilatation. There are areas of decreased attenuation within the white matter tracts of the supratentorial brain, consistent with microvascular disease changes. Vascular: No hyperdense vessel or unexpected calcification. Skull: Normal. Negative for fracture or focal lesion. Sinuses/Orbits: There is mild left maxillary sinus mucosal thickening. Other: None. IMPRESSION: 1. No acute intracranial abnormality. 2. Generalized cerebral atrophy and microvascular disease changes of the supratentorial brain. 3. Mild left maxillary sinus disease. Electronically Signed   By: Virgina Norfolk M.D.   On: 01/30/2022 01:12   CT CHEST ABDOMEN PELVIS WO CONTRAST  Result Date: 01/29/2022 CLINICAL DATA:  Status post trauma. EXAM: CT CHEST, ABDOMEN AND PELVIS WITHOUT CONTRAST TECHNIQUE: Multidetector CT imaging of the chest, abdomen and pelvis was performed following the standard protocol without IV contrast. RADIATION DOSE REDUCTION: This exam was performed according to the departmental dose-optimization program which includes automated exposure control, adjustment of the mA and/or kV according to patient size and/or use of iterative reconstruction technique. COMPARISON:  July 29, 2021 FINDINGS: CT CHEST FINDINGS Cardiovascular: There is marked severity calcification of the thoracic aorta, without evidence of aortic aneurysm. An artificial aortic valve is seen. Normal heart size with moderate severity coronary artery calcification. A trace amount of pericardial fluid is present. Mediastinum/Nodes: No enlarged mediastinal, hilar, or axillary lymph nodes. Thyroid gland, trachea, and esophagus demonstrate no significant findings. Lungs/Pleura: There is marked severity emphysematous lung disease. Stable mild to moderate severity posterior right upper lobe linear scarring is seen. Mild left upper lobe and  posterior left basilar atelectasis is seen. A stable 6 mm noncalcified lung nodule is noted within the lateral aspect of the right lower lobe (axial CT image 82, CT series 4). Musculoskeletal: Fracture deformities are seen involving the body and manubrium of the sternum. Chronic compression fracture deformities are seen at the levels of T8, T9 and T12. An additional compression fracture deformity of the T7 vertebral body is seen. This represents a new finding when compared to the prior exam. CT ABDOMEN PELVIS FINDINGS Hepatobiliary: Multiple hepatic cysts are seen, the largest of which measures 1.5 cm and is noted within the anterior aspect of the left lobe of the liver. Multiple surgical clips are seen posterior to the left lobe of the liver. No gallstones, gallbladder wall thickening, or biliary dilatation. Pancreas: Unremarkable. No pancreatic ductal dilatation or surrounding inflammatory changes. Spleen: Normal in size without focal abnormality. Adrenals/Urinary Tract: The right adrenal gland is unremarkable. A 1.4 cm x 0.8 cm low-attenuation left adrenal mass is seen (approximately 36.71 Hounsfield units). Kidneys are normal, without renal calculi, focal lesion, or hydronephrosis. Bladder is unremarkable. Stomach/Bowel: Stomach is within normal limits. The appendix is not identified. A large amount of stool is seen within the ascending colon. No evidence of bowel wall thickening, distention, or inflammatory changes. Noninflamed diverticula are seen throughout the sigmoid colon. Vascular/Lymphatic: Aortic atherosclerosis. No enlarged abdominal or pelvic lymph nodes. Reproductive: Status post hysterectomy. No adnexal masses. Other: No abdominal wall hernia  or abnormality. No abdominopelvic ascites. Musculoskeletal: A chronic pressure in fracture deformity and subsequent vertebroplasty is seen at the level of L1. Extensive postoperative changes are seen within the mid and lower lumbar spine. IMPRESSION: 1.  Fracture deformities involving the body and manubrium of the sternum. 2. Chronic compression fracture deformities at the levels of T8, T9 and T12 with a new compression fracture deformity of the T7 vertebral body. 3. Stable 6 mm right lower lobe noncalcified lung nodule. 4. Marked severity emphysematous lung disease with stable mild to moderate severity posterior right upper lobe linear scarring. 5. Multiple hepatic cysts. 6. Stable low-attenuation left adrenal mass which likely represents a benign adenoma. Correlation with 1 year follow-up adrenal washout CT is recommended. This recommendation follows ACR consensus guidelines: Management of Incidental Adrenal Masses: A White Paper of the ACR Incidental Findings Committee. J Am Coll Radiol 2017;14:1038-1044. 7. Sigmoid diverticulosis. 8. Extensive postoperative changes within the mid and lower lumbar spine. Aortic Atherosclerosis (ICD10-I70.0) and Emphysema (ICD10-J43.9). Electronically Signed   By: Virgina Norfolk M.D.   On: 01/29/2022 23:20        Scheduled Meds:  acetaminophen  1,000 mg Oral Q6H   aspirin EC  81 mg Oral Daily   budesonide  0.5 mg Nebulization BID   [START ON 01/31/2022] vitamin B-12  500 mcg Oral Daily   gabapentin  100 mg Oral TID   heparin injection (subcutaneous)  5,000 Units Subcutaneous Q8H   lip balm   Topical BID   melatonin  3 mg Oral QHS   pantoprazole  40 mg Oral Daily   Continuous Infusions:  cefTRIAXone (ROCEPHIN)  IV Stopped (01/30/22 1021)   lactated ringers 75 mL/hr at 01/30/22 0551     LOS: 0 days      Phillips Climes, MD [facility]   To contact the attending provider between 7A-7P or the covering provider during after hours 7P-7A, please log into the web site [email or web address] and access using universal [place] password for that web site. If you do not have the password, please call the hospital operator.  01/30/2022, 4:02 PM

## 2022-01-30 NOTE — Progress Notes (Signed)
Patient ID: Katherine [name], female   DOB: 08-16-1928, 88 y.o.   MRN: [id number]      Subjective: Just came from [facility] Some sternal pain ROS negative except as listed above. Objective: Vital signs in last 24 hours: Temp:  [97.4 F (36.3 C)-98.5 F (36.9 C)] 97.9 F (36.6 C) (08/17 1636) Pulse Rate:  [74-104] 104 (08/17 1636) Resp:  [18-29] 20 (08/17 1636) BP: (99-185)/(57-88) 133/66 (08/17 1636) SpO2:  [95 %-100 %] 100 % (08/17 1636)    Intake/Output from previous day: 08/16 0701 - 08/17 0700 In: 100 [IV Piggyback:100] Out: 300 [Urine:300] Intake/Output this shift: Total I/O In: 400 [IV Piggyback:400] Out: -   General appearance: cooperative Chest wall: anterior and R chest tenderness Cardio: regular rate and rhythm GI: soft, NT Extremities: scattered bruises of various ages Neurologic: Mental status: Alert, oriented, thought content appropriate Motor: MAE. F/C  Lab Results: CBC  Recent Labs    01/29/22 2306 01/30/22 1610  WBC 10.3 9.6  HGB 10.9* 10.3*  HCT 34.4* 33.2*  PLT 169 186   BMET Recent Labs    01/29/22 2306 01/30/22 1610  NA 140 136  K 2.9* 4.5  CL 100 108  CO2 31 24  GLUCOSE 128* 258*  BUN 17 14  CREATININE 1.11* 0.97  CALCIUM 9.1 7.3*   PT/INR No results for input(s): "LABPROT", "INR" in the last 72 hours. ABG No results for input(s): "PHART", "HCO3" in the last 72 hours.  Invalid input(s): "PCO2", "PO2"  Studies/Results: CT Cervical Spine Wo Contrast  Result Date: 01/30/2022 CLINICAL DATA:  History of recent fall. EXAM: CT CERVICAL SPINE WITHOUT CONTRAST TECHNIQUE: Multidetector CT imaging of the cervical spine was performed without intravenous contrast. Multiplanar CT image reconstructions were also generated. RADIATION DOSE REDUCTION: This exam was performed according to the departmental dose-optimization program which includes automated exposure control, adjustment of the mA and/or kV according to patient size and/or use of iterative  reconstruction technique. COMPARISON:  June 23, 2019 FINDINGS: Alignment: There is straightening of the normal cervical spine lordosis. Stable, stable stepwise 2 mm anterolisthesis of the C2 and C3 vertebral bodies is noted. Skull base and vertebrae: No acute fracture. No primary bone lesion or focal pathologic process. Soft tissues and spinal canal: No prevertebral fluid or swelling. No visible canal hematoma. Disc levels: There is marked severity endplate sclerosis and mild anterior osteophyte formation at the levels of C4-C5 and C5-C6. There is marked severity narrowing of the anterior atlantoaxial articulation. Marked severity intervertebral disc space narrowing is seen at C4-C5 and C5-C6. Mild to moderate severity, predominant posterior intervertebral disc space narrowing is seen at C2-C3 and C3-C4. Bilateral marked severity multilevel facet joint hypertrophy is noted. Upper chest: Moderate severity linear scarring is seen within the posterior aspect of the right upper lobe. Other: None. IMPRESSION: 1. No acute fracture or traumatic malalignment of the cervical spine. 2. Marked severity multilevel degenerative changes, as described above. 3. Stable, stable, stepwise 2 mm anterolisthesis of the C2 and C3 vertebral bodies. Electronically Signed   By: Virgina Norfolk M.D.   On: 01/30/2022 01:28   CT Angio Chest PE W and/or Wo Contrast  Result Date: 01/30/2022 CLINICAL DATA:  Status post fall 3 weeks ago. EXAM: CT ANGIOGRAPHY CHEST WITH CONTRAST TECHNIQUE: Multidetector CT imaging of the chest was performed using the standard protocol during bolus administration of intravenous contrast. Multiplanar CT image reconstructions and MIPs were obtained to evaluate the vascular anatomy. RADIATION DOSE REDUCTION: This exam was performed according to  the departmental dose-optimization program which includes automated exposure control, adjustment of the mA and/or kV according to patient size and/or use of iterative  reconstruction technique. CONTRAST:  62m OMNIPAQUE IOHEXOL 350 MG/ML SOLN COMPARISON:  January 29, 2022 and July 29, 2021 FINDINGS: Cardiovascular: There is marked severity calcification of the thoracic aorta, without evidence of aortic aneurysm or dissection. An artificial aortic valve is seen. Satisfactory opacification of the pulmonary arteries to the segmental level. No evidence of pulmonary embolism. Normal heart size with mild coronary artery calcification. No pericardial effusion. Mediastinum/Nodes: No enlarged mediastinal, hilar, or axillary lymph nodes. Small thyroid nodules are seen within both the right and left lobes of the thyroid gland. The trachea and esophagus demonstrate no significant findings. Lungs/Pleura: There is marked severity emphysematous lung disease. Stable mild to moderate severity right upper lobe linear scarring is again noted with mild, stable left upper lobe and posterior left basilar atelectatic changes. A stable 6 mm noncalcified lung nodule is seen within the lateral aspect of the right lower lobe. There is no evidence of acute infiltrate, pleural effusion or pneumothorax. Upper Abdomen: Multiple stable hepatic cysts are seen. Multiple surgical clips are noted within the region posterior to the left lobe of the liver. Musculoskeletal: Acute fractures are seen involving the body and manubrium of the sternum. Ill-defined deformities of indeterminate age are seen along the anterolateral aspects of the sixth and seventh right ribs. Chronic compression fracture deformities are seen involving the T8, T9 and T12 vertebral bodies. A new compression fracture deformity of the T7 vertebral body is noted. Review of the MIP images confirms the above findings. IMPRESSION: 1. Acute fractures involving the body and manubrium of the sternum. 2. Ill-defined deformities of indeterminate age along the anterolateral aspects of the sixth and seventh right ribs. Correlation with physical examination  is recommended to determine the presence of point tenderness. 3. Chronic compression fracture deformities of the T8, T9 and T12 vertebral bodies. 4. New compression fracture deformity of the T7 vertebral body. 5. Stable 6 mm noncalcified lung nodule within the lateral aspect of the right lower lobe. Non-contrast chest CT at 6-12 months is recommended. If the nodule is stable at time of repeat CT, then future CT at 18-24 months (from today's scan) is considered optional for low-risk patients, but is recommended for high-risk patients. This recommendation follows the consensus statement: Guidelines for Management of Incidental Pulmonary Nodules Detected on CT Images: From the Fleischner Society 2017; Radiology 2017; 284:228-243. 6. Marked severity emphysematous lung disease with stable bilateral upper lobe and posterior left basilar atelectatic changes. Aortic Atherosclerosis (ICD10-I70.0) and Emphysema (ICD10-J43.9). Electronically Signed   By: TVirgina NorfolkM.D.   On: 01/30/2022 01:24   CT Head Wo Contrast  Result Date: 01/30/2022 CLINICAL DATA:  Status post fall 3 weeks ago. EXAM: CT HEAD WITHOUT CONTRAST TECHNIQUE: Contiguous axial images were obtained from the base of the skull through the vertex without intravenous contrast. RADIATION DOSE REDUCTION: This exam was performed according to the departmental dose-optimization program which includes automated exposure control, adjustment of the mA and/or kV according to patient size and/or use of iterative reconstruction technique. COMPARISON:  October 10, 2021 FINDINGS: Brain: There is moderate severity cerebral atrophy with widening of the extra-axial spaces and ventricular dilatation. There are areas of decreased attenuation within the white matter tracts of the supratentorial brain, consistent with microvascular disease changes. Vascular: No hyperdense vessel or unexpected calcification. Skull: Normal. Negative for fracture or focal lesion. Sinuses/Orbits:  There is mild left  maxillary sinus mucosal thickening. Other: None. IMPRESSION: 1. No acute intracranial abnormality. 2. Generalized cerebral atrophy and microvascular disease changes of the supratentorial brain. 3. Mild left maxillary sinus disease. Electronically Signed   By: Virgina Norfolk M.D.   On: 01/30/2022 01:12   CT CHEST ABDOMEN PELVIS WO CONTRAST  Result Date: 01/29/2022 CLINICAL DATA:  Status post trauma. EXAM: CT CHEST, ABDOMEN AND PELVIS WITHOUT CONTRAST TECHNIQUE: Multidetector CT imaging of the chest, abdomen and pelvis was performed following the standard protocol without IV contrast. RADIATION DOSE REDUCTION: This exam was performed according to the departmental dose-optimization program which includes automated exposure control, adjustment of the mA and/or kV according to patient size and/or use of iterative reconstruction technique. COMPARISON:  July 29, 2021 FINDINGS: CT CHEST FINDINGS Cardiovascular: There is marked severity calcification of the thoracic aorta, without evidence of aortic aneurysm. An artificial aortic valve is seen. Normal heart size with moderate severity coronary artery calcification. A trace amount of pericardial fluid is present. Mediastinum/Nodes: No enlarged mediastinal, hilar, or axillary lymph nodes. Thyroid gland, trachea, and esophagus demonstrate no significant findings. Lungs/Pleura: There is marked severity emphysematous lung disease. Stable mild to moderate severity posterior right upper lobe linear scarring is seen. Mild left upper lobe and posterior left basilar atelectasis is seen. A stable 6 mm noncalcified lung nodule is noted within the lateral aspect of the right lower lobe (axial CT image 82, CT series 4). Musculoskeletal: Fracture deformities are seen involving the body and manubrium of the sternum. Chronic compression fracture deformities are seen at the levels of T8, T9 and T12. An additional compression fracture deformity of the T7 vertebral  body is seen. This represents a new finding when compared to the prior exam. CT ABDOMEN PELVIS FINDINGS Hepatobiliary: Multiple hepatic cysts are seen, the largest of which measures 1.5 cm and is noted within the anterior aspect of the left lobe of the liver. Multiple surgical clips are seen posterior to the left lobe of the liver. No gallstones, gallbladder wall thickening, or biliary dilatation. Pancreas: Unremarkable. No pancreatic ductal dilatation or surrounding inflammatory changes. Spleen: Normal in size without focal abnormality. Adrenals/Urinary Tract: The right adrenal gland is unremarkable. A 1.4 cm x 0.8 cm low-attenuation left adrenal mass is seen (approximately 36.71 Hounsfield units). Kidneys are normal, without renal calculi, focal lesion, or hydronephrosis. Bladder is unremarkable. Stomach/Bowel: Stomach is within normal limits. The appendix is not identified. A large amount of stool is seen within the ascending colon. No evidence of bowel wall thickening, distention, or inflammatory changes. Noninflamed diverticula are seen throughout the sigmoid colon. Vascular/Lymphatic: Aortic atherosclerosis. No enlarged abdominal or pelvic lymph nodes. Reproductive: Status post hysterectomy. No adnexal masses. Other: No abdominal wall hernia or abnormality. No abdominopelvic ascites. Musculoskeletal: A chronic pressure in fracture deformity and subsequent vertebroplasty is seen at the level of L1. Extensive postoperative changes are seen within the mid and lower lumbar spine. IMPRESSION: 1. Fracture deformities involving the body and manubrium of the sternum. 2. Chronic compression fracture deformities at the levels of T8, T9 and T12 with a new compression fracture deformity of the T7 vertebral body. 3. Stable 6 mm right lower lobe noncalcified lung nodule. 4. Marked severity emphysematous lung disease with stable mild to moderate severity posterior right upper lobe linear scarring. 5. Multiple hepatic cysts.  6. Stable low-attenuation left adrenal mass which likely represents a benign adenoma. Correlation with 1 year follow-up adrenal washout CT is recommended. This recommendation follows ACR consensus guidelines: Management of Incidental Adrenal Masses:  A White Paper of the ACR Incidental Findings Committee. J Am Coll Radiol 2017;14:1038-1044. 7. Sigmoid diverticulosis. 8. Extensive postoperative changes within the mid and lower lumbar spine. Aortic Atherosclerosis (ICD10-I70.0) and Emphysema (ICD10-J43.9). Electronically Signed   By: Virgina Norfolk M.D.   On: 01/29/2022 23:20    Anti-infectives: Anti-infectives (From admission, onward)    Start     Dose/Rate Route Frequency Ordered Stop   01/30/22 0800  cefTRIAXone (ROCEPHIN) 1 g in sodium chloride 0.9 % 100 mL IVPB        1 g 200 mL/hr over 30 Minutes Intravenous Every 24 hours 01/30/22 0736         Assessment/Plan: Fall 3 weeks ago  Sternal FX, R rib FX 6-7 - multimodal pain control, pulmonary toilet.  T7 FX - Dr. Kathyrn Sheriff to evaluate COPD - complicates above, per TR[facility]. Pulmonologist is Dr. Lamonte Sakai. S/P TAVR 08/13/21 Malnutrition (pre-albumin 15)  I spoke with her husband at the bedside as well. Plan PT/OT.  LOS: 0 days    Georganna Skeans, MD, MPH, FACS Trauma & General Surgery Use [email or web address] to contact on call provider  01/30/2022

## 2022-01-30 NOTE — Progress Notes (Signed)
Trauma Event Note    TRN at bedside to round/complete CAGE AID. Patient asleep, did not rouse to her name being called. Equal chest rise and fall noted.     Yonathan Perrow O Ellwyn Ergle  Trauma Response RN  Please call TRN at [phone] for further assistance.

## 2022-01-30 NOTE — ED Notes (Signed)
Pt resting, respirations equal & unlabored.

## 2022-01-30 NOTE — ED Notes (Signed)
Pt attempted to void without success, will perform I&O if needed shortly.

## 2022-01-30 NOTE — ED Notes (Signed)
Pt placed on bedpan

## 2022-01-30 NOTE — Telephone Encounter (Signed)
Patient husband aware

## 2022-01-31 DIAGNOSIS — E876 Hypokalemia: Secondary | ICD-10-CM

## 2022-01-31 DIAGNOSIS — N390 Urinary tract infection, site not specified: Secondary | ICD-10-CM | POA: Diagnosis not present

## 2022-01-31 DIAGNOSIS — S2241XA Multiple fractures of ribs, right side, initial encounter for closed fracture: Secondary | ICD-10-CM

## 2022-01-31 DIAGNOSIS — S2222XA Fracture of body of sternum, initial encounter for closed fracture: Secondary | ICD-10-CM | POA: Diagnosis not present

## 2022-01-31 MED ORDER — LIDOCAINE 5 % EX PTCH
1.0000 | MEDICATED_PATCH | CUTANEOUS | Status: DC
  Administered 2022-02-01 – 2022-02-03 (×3): 1 via TRANSDERMAL
  Filled 2022-01-31 (×3): qty 1

## 2022-01-31 MED ORDER — SENNOSIDES-DOCUSATE SODIUM 8.6-50 MG PO TABS
1.0000 | ORAL_TABLET | Freq: Every day | ORAL | Status: DC
  Administered 2022-01-31: 1 via ORAL
  Filled 2022-01-31: qty 1

## 2022-01-31 NOTE — Progress Notes (Signed)
PT Cancellation Note  Patient Details Name: Katherine [name] MRN: [id number] DOB: 1928-12-05   Cancelled Treatment:    Reason Eval/Treat Not Completed: Medical issues which prohibited therapy. PT awaiting neurosurgery recommendations for new T7 compression fx.   Zenaida Niece 01/31/2022, 10:23 AM

## 2022-01-31 NOTE — Progress Notes (Signed)
PROGRESS NOTE    Katherine [name]  CZY:[id number] DOB: 13-Aug-1928 DOA: 01/29/2022 PCP: Elby Showers, MD     Brief Narrative:   H/o severe COPD on home O2 2 L, severe aortic stenosis status post TAVR,   Seen by PCP last month on 7/21 after a fall and x-ray revealed acute sternal fracture.   Seen in the ED on 7/26 for ongoing pain and was prescribed oral morphine.  PCP had also referred the patient to pain management and was prescribed tramadol.   Patient returned to the ED on  8/16 with ongoing pain after another fall.   Evaluated by trauma surgeon, PT OT, placement, as husband states he can not care for her at home anymore  Subjective:  Reports feeling better, report most of her pain is at right rib area, agreed to try lidocaine patch   Assessment & Plan:  Principal Problem:   Closed fracture of body of sternum Active Problems:   COPD, severe (HCC)   Lung nodule   UTI (urinary tract infection)   Closed fracture of manubrium   Closed post-traumatic compression fracture of T7 thoracic vertebra    Fracture of ribs - right 6th & 7th   Adrenal mass (HCC)   Hypokalemia   QT prolongation  frequent falls/multiple fractures -Dr. Waldron Labs discussed case with neurosurgery Dr. Kathyrn Sheriff regarding T7 vertebral compression fracture Dr. Kathyrn Sheriff, no need for surgical intervention, and fracture is too high for a brace, can follow as an outpatient with neurosurgery in few weeks if still having severe pain -Seen by trauma surgeon did not recommend any intervention, recommend PT OT and pain control -plan for SNF placement  UTI Currently on Rocephin, follow-up urine culture  Hypokalemia K replaced and improved   Failure to thrive Seen by palliative care in May 2023 Recommend palliative care continue to follow patient at snf   I have Reviewed nursing notes, Vitals, pain scores, I/o's, Lab results and  imaging results since pt's last encounter, details please see discussion above   I ordered the following labs:  Unresulted Labs (From admission, onward)     Start     Ordered   02/01/22 0500  CBC with Differential/Platelet  Tomorrow morning,   R       Question:  Specimen collection method  Answer:  Lab=Lab collect   01/31/22 2226   02/01/22 0500  Magnesium  Tomorrow morning,   R       Question:  Specimen collection method  Answer:  Lab=Lab collect   01/31/22 2226   02/01/22 0500  Phosphorus  Tomorrow morning,   R       Question:  Specimen collection method  Answer:  Lab=Lab collect   01/31/22 2226   01/31/22 0932  Basic metabolic panel  Once,   R       Question:  Specimen collection method  Answer:  Lab=Lab collect   01/31/22 2226             DVT prophylaxis: heparin injection 5,000 Units Start: 01/30/22 2200 SCDs Start: 01/30/22 0747   Code Status:   Code Status: Full Code  Family Communication: patient  Disposition:   Dispo: The patient is from: home              Anticipated d/c is to: SNF once urine culture resulted              Antimicrobials:   Anti-infectives (From admission, onward)    Start     Dose/Rate  Route Frequency Ordered Stop   01/30/22 0800  cefTRIAXone (ROCEPHIN) 1 g in sodium chloride 0.9 % 100 mL IVPB        1 g 200 mL/hr over 30 Minutes Intravenous Every 24 hours 01/30/22 0736             Objective: Vitals:   01/31/22 0815 01/31/22 0835 01/31/22 2000 01/31/22 2022  BP: (!) 144/49  130/82   Pulse: 71 71  94  Resp: '17 16 17 18  '$ Temp: 98.3 F (36.8 C)  97.7 F (36.5 C)   TempSrc: Axillary  Oral   SpO2:  100% 99% 97%    Intake/Output Summary (Last 24 hours) at 01/31/2022 2226 Last data filed at 01/31/2022 0300 Gross per 24 hour  Intake 343.9 ml  Output --  Net 343.9 ml   There were no vitals filed for this visit.  Examination:  General exam: alert, awake, communicative,calm, NAD Respiratory system: Diminished, no wheezing, no rales, no rhonchi. Respiratory effort normal. Cardiovascular system:  RRR.   Gastrointestinal system: Abdomen is nondistended, soft and nontender.  Normal bowel sounds heard. Central nervous system: Alert and oriented. No focal neurological deficits. Extremities:  no edema Skin: No rashes, lesions or ulcers Psychiatry: Judgement and insight appear normal. Mood & affect appropriate.     Data Reviewed: I have personally reviewed  labs and visualized  imaging studies since the last encounter and formulate the plan        Scheduled Meds:  acetaminophen  1,000 mg Oral Q6H   ALPRAZolam  0.25 mg Oral QHS   aspirin EC  81 mg Oral Daily   budesonide  0.5 mg Nebulization BID   gabapentin  100 mg Oral TID   heparin injection (subcutaneous)  5,000 Units Subcutaneous Q8H   lidocaine  1 patch Transdermal Q24H   lip balm   Topical BID   melatonin  3 mg Oral QHS   pantoprazole  40 mg Oral Daily   senna-docusate  1 tablet Oral QHS   vitamin B-12  500 mcg Oral Daily   Continuous Infusions:  cefTRIAXone (ROCEPHIN)  IV 1 g (01/31/22 0757)   lactated ringers 75 mL/hr at 01/30/22 0551     LOS: 1 day     Florencia Reasons, MD PhD FACP [facility]  Available via Epic secure chat 7am-7pm for nonurgent issues Please page for urgent issues To page the attending provider between 7A-7P or the covering provider during after hours 7P-7A, please log into the web site [email or web address] and access using universal [place] password for that web site. If you do not have the password, please call the hospital operator.    01/31/2022, 10:26 PM

## 2022-01-31 NOTE — Evaluation (Signed)
Physical Therapy Evaluation Patient Details Name: EVELISSE SZALKOWSKI MRN: [id number] DOB: 1928/08/14 Today's Date: 01/31/2022  History of Present Illness  88 y.o. female presents to [facility] hospital on 01/29/2022 after a fall. Ptalso with recent fall 7/21, experiencing a sternal fx. Pt also found to have R rib 6-7 fxs and new T7 vertebral compression fx (conservative management). Pt also with adrenal mass and RLL lung nodule. PMH includes COPD, aortic stenosis s/p TAVR, HLD.  Clinical Impression  Pt presents to PT with deficits in cognition, gait, balance, functional mobility, endurance, cardiopulmonary function. Pt reports she often falls backwards but has no awareness of a posterior lean. Pt consistently demonstrates a posterior lean with multiple transfer attempts this session. Pt demonstrates difficulty correcting posterior preference with verbal and visual cueing, requiring tactile cues and physical assistance to prevent falls. Pt will benefit from aggressive mobilization in an effort to reduce falls risk. PT recommends AIR consult at this time.       Recommendations for follow up therapy are one component of a multi-disciplinary discharge planning process, led by the attending physician.  Recommendations may be updated based on patient status, additional functional criteria and insurance authorization.  Follow Up Recommendations Acute inpatient rehab (3hours/day)      Assistance Recommended at Discharge Frequent or constant Supervision/Assistance  Patient can return home with the following  A lot of help with walking and/or transfers;A lot of help with bathing/dressing/bathroom;Assistance with cooking/housework;Direct supervision/assist for medications management;Direct supervision/assist for financial management;Assist for transportation;Help with stairs or ramp for entrance    Equipment Recommendations Wheelchair (measurements PT);Wheelchair cushion (measurements PT)  Recommendations for Other  Services  Rehab consult    Functional Status Assessment Patient has had a recent decline in their functional status and demonstrates the ability to make significant improvements in function in a reasonable and predictable amount of time.     Precautions / Restrictions Precautions Precautions: Fall Precaution Comments: tends to lean posteriorly Restrictions Weight Bearing Restrictions: No      Mobility  Bed Mobility Overal bed mobility: Needs Assistance Bed Mobility: Rolling, Sidelying to Sit Rolling: Min assist Sidelying to sit: Min assist       General bed mobility comments: PT provides instruction for log roll technique    Transfers Overall transfer level: Needs assistance Equipment used: Rolling walker (2 wheels) Transfers: Sit to/from Stand, Bed to chair/wheelchair/BSC Sit to Stand: Mod assist   Step pivot transfers: Mod assist       General transfer comment: posterior lean, PT providing significant cueing as well as physical assistance to provide anterior weight shift as verbal cues appear insufficient to mitigate posterior lean    Ambulation/Gait Ambulation/Gait assistance:  (deferred due to falls risk 2/2 posterior lean)                Stairs            Wheelchair Mobility    Modified Rankin (Stroke Patients Only)       Balance Overall balance assessment: Needs assistance Sitting-balance support: No upper extremity supported, Feet supported Sitting balance-Leahy Scale: Fair     Standing balance support: Bilateral upper extremity supported, Reliant on assistive device for balance Standing balance-Leahy Scale: Poor Standing balance comment: posterior lean                             Pertinent Vitals/Pain Pain Assessment Pain Assessment: 0-10 Pain Score: 4  Pain Location: R ribs and back Pain Descriptors /  Indicators: Aching Pain Intervention(s): Monitored during session    Home Living Family/patient expects to be  discharged to:: Inpatient rehab Living Arrangements: Spouse/significant other Available Help at Discharge: Family;Available 24 hours/day Type of Home: House Home Access: Stairs to enter Entrance Stairs-Rails: Right;Left;Can reach both Entrance Stairs-Number of Steps: 5 Alternate Level Stairs-Number of Steps: flight with chair lift Home Layout: Two level;Bed/bath upstairs Home Equipment: Rolling Walker (2 wheels);Rollator (4 wheels);BSC/3in1;Shower seat;Grab bars - tub/shower;Grab bars - toilet;Wheelchair - manual;Other (comment)      Prior Function Prior Level of Function : Needs assist             Mobility Comments: pt reports ambulating with a rollator at home, has been needing assistance with all mobility since fall in July. Pt reports mulitple people have told her she leans backward although she does not feel this ADLs Comments: Husband assists with shower transfers for safety but pt able to bathe self once seated on shower chair. PRN assist with LB dressing. Husband completes IADLs.     Hand Dominance   Dominant Hand: Left    Extremity/Trunk Assessment   Upper Extremity Assessment Upper Extremity Assessment: Generalized weakness    Lower Extremity Assessment Lower Extremity Assessment: Generalized weakness    Cervical / Trunk Assessment Cervical / Trunk Assessment: Kyphotic  Communication   Communication: HOH  Cognition Arousal/Alertness: Awake/alert Behavior During Therapy: WFL for tasks assessed/performed Overall Cognitive Status: Impaired/Different from baseline Area of Impairment: Memory, Orientation, Safety/judgement, Awareness                 Orientation Level: Disoriented to, Place (pt appears to intermittently think she is at home, often reporting her walker or her blankets are in the closet)   Memory: Decreased recall of precautions, Decreased short-term memory   Safety/Judgement: Decreased awareness of safety, Decreased awareness of  deficits Awareness: Emergent            General Comments General comments (skin integrity, edema, etc.): pt desats with attempts to wean to room air, requires 2L [place] to maintain stats in 90s    Exercises     Assessment/Plan    PT Assessment Patient needs continued PT services  PT Problem List Decreased strength;Decreased activity tolerance;Decreased balance;Decreased mobility;Decreased cognition;Decreased knowledge of use of DME;Decreased safety awareness;Decreased knowledge of precautions;Cardiopulmonary status limiting activity;Pain       PT Treatment Interventions DME instruction;Gait training;Therapeutic activities;Stair training;Functional mobility training;Therapeutic exercise;Balance training;Neuromuscular re-education;Patient/family education    PT Goals (Current goals can be found in the Care Plan section)  Acute Rehab PT Goals Patient Stated Goal: to stop leaning backward and falling PT Goal Formulation: With patient Time For Goal Achievement: 02/14/22 Potential to Achieve Goals: Fair    Frequency Min 4X/week     Co-evaluation               AM-PAC PT "6 Clicks" Mobility  Outcome Measure Help needed turning from your back to your side while in a flat bed without using bedrails?: A Little Help needed moving from lying on your back to sitting on the side of a flat bed without using bedrails?: A Little Help needed moving to and from a bed to a chair (including a wheelchair)?: A Lot Help needed standing up from a chair using your arms (e.g., wheelchair or bedside chair)?: A Lot Help needed to walk in hospital room?: Total Help needed climbing 3-5 steps with a railing? : Total 6 Click Score: 12    End of Session Equipment Utilized During Treatment:  Oxygen Activity Tolerance: Patient tolerated treatment well Patient left: in chair;with call bell/phone within reach;with chair alarm set Nurse Communication: Mobility status PT Visit Diagnosis: Repeated falls  (R29.6);Other abnormalities of gait and mobility (R26.89)    Time: 2162-4469 PT Time Calculation (min) (ACUTE ONLY): 40 min   Charges:   PT Evaluation $PT Eval Low Complexity: 1 Low          Zenaida Niece, PT, DPT Acute Rehabilitation Office [phone]   Zenaida Niece 01/31/2022, 2:31 PM

## 2022-01-31 NOTE — Progress Notes (Signed)
Inpatient Rehab Admissions Coordinator:   Per therapy recommendations, pt was screened for CIR by Shann Medal, PT, DPT.  Do not feel [employer] would approve given medical presentation.  Further note that per H&P pt's spouse feels he can no longer care for patient at home.  Would not recommend CIR consult at this time.   Shann Medal, PT, DPT Admissions Coordinator [phone] 01/31/22  2:43 PM

## 2022-01-31 NOTE — Evaluation (Signed)
Occupational Therapy Evaluation Patient Details Name: Katherine [name] MRN: [id number] DOB: July 15, 1928 Today's Date: 01/31/2022   History of Present Illness 88 y.o. female presents to [facility] hospital on 01/29/2022 after a fall. Ptalso with recent fall 7/21, experiencing a sternal fx. Pt also found to have R rib 6-7 fxs and new T7 vertebral compression fx (conservative management). Pt also with adrenal mass and RLL lung nodule. PMH includes COPD, aortic stenosis s/p TAVR, HLD.   Clinical Impression   This 88 yo female admitted with above presents to acute OT with PLOF of needing very little A with basic ADLs from a rollator level. Currently she is setup/S-Max A for basic ADLs (needing for A for LB) and Mod A for transfers with RW due to tendency for posterior lean that she is not aware of. SHe will continue to benefit from acute OT with follow up on AIR.      Recommendations for follow up therapy are one component of a multi-disciplinary discharge planning process, led by the attending physician.  Recommendations may be updated based on patient status, additional functional criteria and insurance authorization.   Follow Up Recommendations  Acute inpatient rehab (3hours/day)    Assistance Recommended at Discharge Frequent or constant Supervision/Assistance  Patient can return home with the following A lot of help with walking and/or transfers;A lot of help with bathing/dressing/bathroom;Assistance with cooking/housework;Help with stairs or ramp for entrance;Assist for transportation;Direct supervision/assist for financial management;Direct supervision/assist for medications management    Functional Status Assessment  Patient has had a recent decline in their functional status and demonstrates the ability to make significant improvements in function in a reasonable and predictable amount of time.  Equipment Recommendations  Other (comment) (TBD next venue)    Recommendations for Other Services  Rehab consult     Precautions / Restrictions Precautions Precautions: Fall Precaution Comments: tends to lean posteriorly Restrictions Weight Bearing Restrictions: No      Mobility Bed Mobility               General bed mobility comments: up in recliner    Transfers Overall transfer level: Needs assistance Equipment used: Rolling walker (2 wheels) Transfers: Sit to/from Stand, Bed to chair/wheelchair/BSC Sit to Stand: Min assist (min A sit<>stand, Mod A to maintain standing due to posterior lean) Stand pivot transfers: Mod assist                Balance Overall balance assessment: Needs assistance Sitting-balance support: No upper extremity supported, Feet supported Sitting balance-Leahy Scale: Fair     Standing balance support: Bilateral upper extremity supported, Reliant on assistive device for balance Standing balance-Leahy Scale: Poor Standing balance comment: posterior lean. Attempted to get her to come into a more posterior pelvic tilt instanding by cueing her verbally and tactilly to stick her stomach out--she reported his made her back hurt.                           ADL either performed or assessed with clinical judgement   ADL Overall ADL's : Needs assistance/impaired Eating/Feeding: Independent;Sitting Eating/Feeding Details (indicate cue type and reason): in recliner Grooming: Set up;Supervision/safety;Sitting Grooming Details (indicate cue type and reason): in recliner Upper Body Bathing: Minimal assistance;Sitting Upper Body Bathing Details (indicate cue type and reason): in recliner Lower Body Bathing: Moderate assistance Lower Body Bathing Details (indicate cue type and reason): min A sit<>stand, Mod A to maintain standing due to posterior lean Upper Body Dressing :  Minimal assistance;Sitting Upper Body Dressing Details (indicate cue type and reason): in recliner Lower Body Dressing: Maximal assistance Lower Body Dressing Details  (indicate cue type and reason): min A sit<>stand, Mod A to maintain standing due to posterior lean Toilet Transfer: Moderate assistance;Stand-pivot;Rolling walker (2 wheels) Toilet Transfer Details (indicate cue type and reason): simulated taking steps forward and backward to recliner with RW                 Vision Patient Visual Report: No change from baseline              Pertinent Vitals/Pain Pain Assessment Pain Assessment: Faces Pain Score: 6  Pain Location: sternal pain with sit<>stand transfers Pain Descriptors / Indicators: Aching, Sore Pain Intervention(s): Limited activity within patient's tolerance, Monitored during session, Repositioned, Patient requesting pain meds-RN notified     Hand Dominance Left   Extremity/Trunk Assessment Upper Extremity Assessment Upper Extremity Assessment: Generalized weakness           Communication Communication Communication: HOH   Cognition Arousal/Alertness: Awake/alert Behavior During Therapy: WFL for tasks assessed/performed                                   General Comments: Can't decide true cognitive deficits v. HOH making it appear she has cognitive deficits                Home Living Family/patient expects to be discharged to:: Inpatient rehab Living Arrangements: Spouse/significant other Available Help at Discharge: Family;Available 24 hours/day Type of Home: House Home Access: Stairs to enter [employer] of Steps: 5 Entrance Stairs-Rails: Right;Left;Can reach both Home Layout: Two level;Bed/bath upstairs Alternate Level Stairs-Number of Steps: flight with chair lift   Bathroom Shower/Tub: Hospital doctor Toilet: Handicapped height     Home Equipment: Conservation officer, nature (2 wheels);Rollator (4 wheels);BSC/3in1;Shower seat;Grab bars - tub/shower;Grab bars - toilet;Wheelchair - Education administrator (comment)          Prior Functioning/Environment Prior Level of Function :  Needs assist             Mobility Comments: Pt uses rollator at home with husband for S; husband A with stairs into house; has stair lift for indoor stairs ADLs Comments: Husband assists with shower transfers for safety but pt able to bathe self once seated on shower chair. PRN assist with LB dressing. Husband completes IADLs.        OT Problem List: Impaired balance (sitting and/or standing);Decreased cognition;Decreased safety awareness;Pain      OT Treatment/Interventions: Self-care/ADL training;DME and/or AE instruction;Patient/family education;Balance training    OT Goals(Current goals can be found in the care plan section) Acute Rehab OT Goals Patient Stated Goal: to feel better OT Goal Formulation: With patient Time For Goal Achievement: 02/21/22 Potential to Achieve Goals: Good  OT Frequency: Min 2X/week       AM-PAC OT "6 Clicks" Daily Activity     Outcome Measure Help from another person eating meals?: None Help from another person taking care of personal grooming?: A Little Help from another person toileting, which includes using toliet, bedpan, or urinal?: A Lot Help from another person bathing (including washing, rinsing, drying)?: A Lot Help from another person to put on and taking off regular upper body clothing?: A Lot Help from another person to put on and taking off regular lower body clothing?: A Lot 6 Click Score: 15   End of Session  Equipment Utilized During Treatment: Rolling walker (2 wheels) Nurse Communication: Mobility status  Activity Tolerance:  (increased her pain) Patient left: in chair;with call bell/phone within reach;with chair alarm set  OT Visit Diagnosis: Unsteadiness on feet (R26.81);Other abnormalities of gait and mobility (R26.89);Pain;Other symptoms and signs involving cognitive function Pain - part of body:  (back and sternal)                Time: 7505-1833 OT Time Calculation (min): 21 min Charges:  OT General Charges $OT  Visit: 1 Visit OT Evaluation $OT Eval Moderate Complexity: [place], OTR/L Acute [employer] [facility] [phone] Office [phone]    Almon Register 01/31/2022, 2:06 PM

## 2022-01-31 NOTE — Progress Notes (Signed)
Pt given her morning daily meds. Pt complained of a pill stuck, jello and water given but not luck. Pt coughed up some meds, unsure which ones or how much was absorbed.

## 2022-01-31 NOTE — Progress Notes (Signed)
Subjective: CC: Pain over her sternum that is worse with palpation. No other areas of pain. SOB at baseline. On 2L that she reports she wears at baseline. No IS/FV in room. No other complaints. Tolerating cld without n/v. Husband reports she has not been eating much at home. Voiding. No BM. Does not think she got oob yesterday.   Objective: Vital signs in last 24 hours: Temp:  [97.2 F (36.2 C)-98.3 F (36.8 C)] 98.3 F (36.8 C) (08/18 0815) Pulse Rate:  [71-104] 71 (08/18 0835) Resp:  [14-22] 16 (08/18 0835) BP: (115-144)/(44-66) 144/49 (08/18 0815) SpO2:  [96 %-100 %] 100 % (08/18 0835) Last BM Date : 01/29/22  Intake/Output from previous day: 08/17 0701 - 08/18 0700 In: 743.9 [I.V.:343.9; IV Piggyback:400] Out: -  Intake/Output this shift: No intake/output data recorded.  PE: Gen:  Frail elderly female that is alert, in NAD, pleasant Card:  Reg rate Pulm:  Faint expiratory wheezing noted, no rales or rhonchi. On 2L. Normal rate and effort. Sternal ttp.  Abd: Soft, ND, NT, +BS MSK: MAE's. No ttp of the b/l shoulder, upper arms, elbow, knees, wrist or hands. Neg logroll test b/l. No ttp over b/l hips, upper legs, lower legs, ankle or feet. She reports some stiffness of the left knee with flexion but no ttp. No ttp over right knee. No LE edema.   Lab Results:  Recent Labs    01/29/22 2306 01/30/22 1610  WBC 10.3 9.6  HGB 10.9* 10.3*  HCT 34.4* 33.2*  PLT 169 186   BMET Recent Labs    01/29/22 2306 01/30/22 1610  NA 140 136  K 2.9* 4.5  CL 100 108  CO2 31 24  GLUCOSE 128* 258*  BUN 17 14  CREATININE 1.11* 0.97  CALCIUM 9.1 7.3*   PT/INR No results for input(s): "LABPROT", "INR" in the last 72 hours. CMP     Component Value Date/Time   NA 136 01/30/2022 1610   NA 145 (H) 08/23/2021 1332   K 4.5 01/30/2022 1610   CL 108 01/30/2022 1610   CO2 24 01/30/2022 1610   GLUCOSE 258 (H) 01/30/2022 1610   BUN 14 01/30/2022 1610   BUN 9 (L) 08/23/2021  1332   CREATININE 0.97 01/30/2022 1610   CREATININE 1.10 (H) 11/28/2021 1106   CALCIUM 7.3 (L) 01/30/2022 1610   PROT 5.7 (L) 01/08/2022 1521   ALBUMIN 3.0 (L) 01/08/2022 1521   AST 26 01/08/2022 1521   AST 37 09/25/2021 1359   ALT 14 01/08/2022 1521   ALT 16 09/25/2021 1359   ALKPHOS 49 01/08/2022 1521   BILITOT 0.3 01/08/2022 1521   BILITOT <0.1 (L) 09/25/2021 1359   GFRNONAA 55 (L) 01/30/2022 1610   GFRNONAA 49 (L) 09/25/2021 1359   GFRNONAA 50 (L) 11/27/2020 0931   GFRAA 58 (L) 11/27/2020 0931   Lipase     Component Value Date/Time   LIPASE 46 05/07/2015 1107    Studies/Results: CT Cervical Spine Wo Contrast  Result Date: 01/30/2022 CLINICAL DATA:  History of recent fall. EXAM: CT CERVICAL SPINE WITHOUT CONTRAST TECHNIQUE: Multidetector CT imaging of the cervical spine was performed without intravenous contrast. Multiplanar CT image reconstructions were also generated. RADIATION DOSE REDUCTION: This exam was performed according to the departmental dose-optimization program which includes automated exposure control, adjustment of the mA and/or kV according to patient size and/or use of iterative reconstruction technique. COMPARISON:  June 23, 2019 FINDINGS: Alignment: There is straightening of the normal  cervical spine lordosis. Stable, stable stepwise 2 mm anterolisthesis of the C2 and C3 vertebral bodies is noted. Skull base and vertebrae: No acute fracture. No primary bone lesion or focal pathologic process. Soft tissues and spinal canal: No prevertebral fluid or swelling. No visible canal hematoma. Disc levels: There is marked severity endplate sclerosis and mild anterior osteophyte formation at the levels of C4-C5 and C5-C6. There is marked severity narrowing of the anterior atlantoaxial articulation. Marked severity intervertebral disc space narrowing is seen at C4-C5 and C5-C6. Mild to moderate severity, predominant posterior intervertebral disc space narrowing is seen at C2-C3  and C3-C4. Bilateral marked severity multilevel facet joint hypertrophy is noted. Upper chest: Moderate severity linear scarring is seen within the posterior aspect of the right upper lobe. Other: None. IMPRESSION: 1. No acute fracture or traumatic malalignment of the cervical spine. 2. Marked severity multilevel degenerative changes, as described above. 3. Stable, stable, stepwise 2 mm anterolisthesis of the C2 and C3 vertebral bodies. Electronically Signed   By: Virgina Norfolk M.D.   On: 01/30/2022 01:28   CT Angio Chest PE W and/or Wo Contrast  Result Date: 01/30/2022 CLINICAL DATA:  Status post fall 3 weeks ago. EXAM: CT ANGIOGRAPHY CHEST WITH CONTRAST TECHNIQUE: Multidetector CT imaging of the chest was performed using the standard protocol during bolus administration of intravenous contrast. Multiplanar CT image reconstructions and MIPs were obtained to evaluate the vascular anatomy. RADIATION DOSE REDUCTION: This exam was performed according to the departmental dose-optimization program which includes automated exposure control, adjustment of the mA and/or kV according to patient size and/or use of iterative reconstruction technique. CONTRAST:  92m OMNIPAQUE IOHEXOL 350 MG/ML SOLN COMPARISON:  January 29, 2022 and July 29, 2021 FINDINGS: Cardiovascular: There is marked severity calcification of the thoracic aorta, without evidence of aortic aneurysm or dissection. An artificial aortic valve is seen. Satisfactory opacification of the pulmonary arteries to the segmental level. No evidence of pulmonary embolism. Normal heart size with mild coronary artery calcification. No pericardial effusion. Mediastinum/Nodes: No enlarged mediastinal, hilar, or axillary lymph nodes. Small thyroid nodules are seen within both the right and left lobes of the thyroid gland. The trachea and esophagus demonstrate no significant findings. Lungs/Pleura: There is marked severity emphysematous lung disease. Stable mild to  moderate severity right upper lobe linear scarring is again noted with mild, stable left upper lobe and posterior left basilar atelectatic changes. A stable 6 mm noncalcified lung nodule is seen within the lateral aspect of the right lower lobe. There is no evidence of acute infiltrate, pleural effusion or pneumothorax. Upper Abdomen: Multiple stable hepatic cysts are seen. Multiple surgical clips are noted within the region posterior to the left lobe of the liver. Musculoskeletal: Acute fractures are seen involving the body and manubrium of the sternum. Ill-defined deformities of indeterminate age are seen along the anterolateral aspects of the sixth and seventh right ribs. Chronic compression fracture deformities are seen involving the T8, T9 and T12 vertebral bodies. A new compression fracture deformity of the T7 vertebral body is noted. Review of the MIP images confirms the above findings. IMPRESSION: 1. Acute fractures involving the body and manubrium of the sternum. 2. Ill-defined deformities of indeterminate age along the anterolateral aspects of the sixth and seventh right ribs. Correlation with physical examination is recommended to determine the presence of point tenderness. 3. Chronic compression fracture deformities of the T8, T9 and T12 vertebral bodies. 4. New compression fracture deformity of the T7 vertebral body. 5. Stable 6 mm  noncalcified lung nodule within the lateral aspect of the right lower lobe. Non-contrast chest CT at 6-12 months is recommended. If the nodule is stable at time of repeat CT, then future CT at 18-24 months (from today's scan) is considered optional for low-risk patients, but is recommended for high-risk patients. This recommendation follows the consensus statement: Guidelines for Management of Incidental Pulmonary Nodules Detected on CT Images: From the Fleischner Society 2017; Radiology 2017; 284:228-243. 6. Marked severity emphysematous lung disease with stable bilateral  upper lobe and posterior left basilar atelectatic changes. Aortic Atherosclerosis (ICD10-I70.0) and Emphysema (ICD10-J43.9). Electronically Signed   By: Virgina Norfolk M.D.   On: 01/30/2022 01:24   CT Head Wo Contrast  Result Date: 01/30/2022 CLINICAL DATA:  Status post fall 3 weeks ago. EXAM: CT HEAD WITHOUT CONTRAST TECHNIQUE: Contiguous axial images were obtained from the base of the skull through the vertex without intravenous contrast. RADIATION DOSE REDUCTION: This exam was performed according to the departmental dose-optimization program which includes automated exposure control, adjustment of the mA and/or kV according to patient size and/or use of iterative reconstruction technique. COMPARISON:  October 10, 2021 FINDINGS: Brain: There is moderate severity cerebral atrophy with widening of the extra-axial spaces and ventricular dilatation. There are areas of decreased attenuation within the white matter tracts of the supratentorial brain, consistent with microvascular disease changes. Vascular: No hyperdense vessel or unexpected calcification. Skull: Normal. Negative for fracture or focal lesion. Sinuses/Orbits: There is mild left maxillary sinus mucosal thickening. Other: None. IMPRESSION: 1. No acute intracranial abnormality. 2. Generalized cerebral atrophy and microvascular disease changes of the supratentorial brain. 3. Mild left maxillary sinus disease. Electronically Signed   By: Virgina Norfolk M.D.   On: 01/30/2022 01:12   CT CHEST ABDOMEN PELVIS WO CONTRAST  Result Date: 01/29/2022 CLINICAL DATA:  Status post trauma. EXAM: CT CHEST, ABDOMEN AND PELVIS WITHOUT CONTRAST TECHNIQUE: Multidetector CT imaging of the chest, abdomen and pelvis was performed following the standard protocol without IV contrast. RADIATION DOSE REDUCTION: This exam was performed according to the departmental dose-optimization program which includes automated exposure control, adjustment of the mA and/or kV according  to patient size and/or use of iterative reconstruction technique. COMPARISON:  July 29, 2021 FINDINGS: CT CHEST FINDINGS Cardiovascular: There is marked severity calcification of the thoracic aorta, without evidence of aortic aneurysm. An artificial aortic valve is seen. Normal heart size with moderate severity coronary artery calcification. A trace amount of pericardial fluid is present. Mediastinum/Nodes: No enlarged mediastinal, hilar, or axillary lymph nodes. Thyroid gland, trachea, and esophagus demonstrate no significant findings. Lungs/Pleura: There is marked severity emphysematous lung disease. Stable mild to moderate severity posterior right upper lobe linear scarring is seen. Mild left upper lobe and posterior left basilar atelectasis is seen. A stable 6 mm noncalcified lung nodule is noted within the lateral aspect of the right lower lobe (axial CT image 82, CT series 4). Musculoskeletal: Fracture deformities are seen involving the body and manubrium of the sternum. Chronic compression fracture deformities are seen at the levels of T8, T9 and T12. An additional compression fracture deformity of the T7 vertebral body is seen. This represents a new finding when compared to the prior exam. CT ABDOMEN PELVIS FINDINGS Hepatobiliary: Multiple hepatic cysts are seen, the largest of which measures 1.5 cm and is noted within the anterior aspect of the left lobe of the liver. Multiple surgical clips are seen posterior to the left lobe of the liver. No gallstones, gallbladder wall thickening, or biliary dilatation.  Pancreas: Unremarkable. No pancreatic ductal dilatation or surrounding inflammatory changes. Spleen: Normal in size without focal abnormality. Adrenals/Urinary Tract: The right adrenal gland is unremarkable. A 1.4 cm x 0.8 cm low-attenuation left adrenal mass is seen (approximately 36.71 Hounsfield units). Kidneys are normal, without renal calculi, focal lesion, or hydronephrosis. Bladder is  unremarkable. Stomach/Bowel: Stomach is within normal limits. The appendix is not identified. A large amount of stool is seen within the ascending colon. No evidence of bowel wall thickening, distention, or inflammatory changes. Noninflamed diverticula are seen throughout the sigmoid colon. Vascular/Lymphatic: Aortic atherosclerosis. No enlarged abdominal or pelvic lymph nodes. Reproductive: Status post hysterectomy. No adnexal masses. Other: No abdominal wall hernia or abnormality. No abdominopelvic ascites. Musculoskeletal: A chronic pressure in fracture deformity and subsequent vertebroplasty is seen at the level of L1. Extensive postoperative changes are seen within the mid and lower lumbar spine. IMPRESSION: 1. Fracture deformities involving the body and manubrium of the sternum. 2. Chronic compression fracture deformities at the levels of T8, T9 and T12 with a new compression fracture deformity of the T7 vertebral body. 3. Stable 6 mm right lower lobe noncalcified lung nodule. 4. Marked severity emphysematous lung disease with stable mild to moderate severity posterior right upper lobe linear scarring. 5. Multiple hepatic cysts. 6. Stable low-attenuation left adrenal mass which likely represents a benign adenoma. Correlation with 1 year follow-up adrenal washout CT is recommended. This recommendation follows ACR consensus guidelines: Management of Incidental Adrenal Masses: A White Paper of the ACR Incidental Findings Committee. J Am Coll Radiol 2017;14:1038-1044. 7. Sigmoid diverticulosis. 8. Extensive postoperative changes within the mid and lower lumbar spine. Aortic Atherosclerosis (ICD10-I70.0) and Emphysema (ICD10-J43.9). Electronically Signed   By: Virgina Norfolk M.D.   On: 01/29/2022 23:20    Anti-infectives: Anti-infectives (From admission, onward)    Start     Dose/Rate Route Frequency Ordered Stop   01/30/22 0800  cefTRIAXone (ROCEPHIN) 1 g in sodium chloride 0.9 % 100 mL IVPB        1  g 200 mL/hr over 30 Minutes Intravenous Every 24 hours 01/30/22 0736          Assessment/Plan Fall 3 weeks ago Sternal FX, R rib FX 6-7 - Also noted to have sternal fx on xray 7/21. Cont multimodal pain control. Ordered IS/FV. On baseline o2. Recommend PT/OT  T7 FX - Per note from [facility], they discussed with NSGY, Dr. Kathyrn Sheriff, and "no need for surgical intervention, and fracture is too high for a brace, can follow as an outpatient with neurosurgery in few weeks if still having severe pain"  - Per [facility] -  COPD - Complicates above, per [facility]. Pulmonologist is Dr. Lamonte Sakai. S/P TAVR 08/13/21 Malnutrition (pre-albumin 15) UTI - On rocephin, ucx pending Adrenal mass - Noted on CT. Radiologist recommending 1 year follow-up adrenal washout CT. Lung nodule - noted on CT. Radiology recommending noncontrast CT chest in 6 to 12 months for follow-up.  FEN - On CLD. Diet can be advanced as tolerated VTE - SCDs, subq heparin ID - Rocephin for UTI per [facility] Plan - Multimodal pain control, pulm toilet and therapies. Patient reports pain is well controlled. She is on baseline o2 requirement. We will sign off. Please call back with any questions or concerns.    LOS: 1 day    Jillyn Ledger , [facility] 01/31/2022, 10:08 AM Please see Amion for pager number during day hours 7:00am-4:30pm

## 2022-02-01 DIAGNOSIS — S2222XA Fracture of body of sternum, initial encounter for closed fracture: Secondary | ICD-10-CM | POA: Diagnosis not present

## 2022-02-01 LAB — CBC WITH DIFFERENTIAL/PLATELET
Abs Immature Granulocytes: 0.01 10*3/uL (ref 0.00–0.07)
Basophils Absolute: 0.1 10*3/uL (ref 0.0–0.1)
Basophils Relative: 1 %
Eosinophils Absolute: 0.3 10*3/uL (ref 0.0–0.5)
Eosinophils Relative: 4 %
HCT: 32.1 % — ABNORMAL LOW (ref 36.0–46.0)
Hemoglobin: 10.3 g/dL — ABNORMAL LOW (ref 12.0–15.0)
Immature Granulocytes: 0 %
Lymphocytes Relative: 23 %
Lymphs Abs: 1.6 10*3/uL (ref 0.7–4.0)
MCH: 29.8 pg (ref 26.0–34.0)
MCHC: 32.1 g/dL (ref 30.0–36.0)
MCV: 92.8 fL (ref 80.0–100.0)
Monocytes Absolute: 0.8 10*3/uL (ref 0.1–1.0)
Monocytes Relative: 12 %
Neutro Abs: 4.2 10*3/uL (ref 1.7–7.7)
Neutrophils Relative %: 60 %
Platelets: 180 10*3/uL (ref 150–400)
RBC: 3.46 MIL/uL — ABNORMAL LOW (ref 3.87–5.11)
RDW: 13.1 % (ref 11.5–15.5)
WBC: 7 10*3/uL (ref 4.0–10.5)
nRBC: 0 % (ref 0.0–0.2)

## 2022-02-01 LAB — BASIC METABOLIC PANEL
Anion gap: 7 (ref 5–15)
BUN: 19 mg/dL (ref 8–23)
CO2: 28 mmol/L (ref 22–32)
Calcium: 8.9 mg/dL (ref 8.9–10.3)
Chloride: 102 mmol/L (ref 98–111)
Creatinine, Ser: 1.3 mg/dL — ABNORMAL HIGH (ref 0.44–1.00)
GFR, Estimated: 39 mL/min — ABNORMAL LOW (ref >60)
Glucose, Bld: 80 mg/dL (ref 70–99)
Potassium: 4.1 mmol/L (ref 3.5–5.1)
Sodium: 137 mmol/L (ref 135–145)

## 2022-02-01 LAB — URINE CULTURE: Culture: >=100000 — AB

## 2022-02-01 LAB — PHOSPHORUS: Phosphorus: 4.1 mg/dL (ref 2.5–4.6)

## 2022-02-01 LAB — MAGNESIUM: Magnesium: 2.1 mg/dL (ref 1.7–2.4)

## 2022-02-01 MED ORDER — SENNOSIDES-DOCUSATE SODIUM 8.6-50 MG PO TABS
1.0000 | ORAL_TABLET | Freq: Two times a day (BID) | ORAL | Status: DC
Start: 2022-02-01 — End: 2022-02-04
  Administered 2022-02-01 – 2022-02-04 (×7): 1 via ORAL
  Filled 2022-02-01 (×7): qty 1

## 2022-02-01 MED ORDER — IPRATROPIUM-ALBUTEROL 0.5-2.5 (3) MG/3ML IN SOLN: 3.0000 mL | Freq: Four times a day (QID) | RESPIRATORY_TRACT | Status: DC | PRN

## 2022-02-01 NOTE — Plan of Care (Signed)
  Problem: Activity: Goal: Risk for activity intolerance will decrease Outcome: Progressing   Problem: Pain Managment: Goal: General experience of comfort will improve Outcome: Progressing   Problem: Safety: Goal: Ability to remain free from injury will improve Outcome: Progressing   

## 2022-02-01 NOTE — TOC Initial Note (Addendum)
Transition of Care [facility]) - Initial/Assessment Note    Patient Details  Name: Katherine [name] MRN: [id number] Date of Birth: Mar 14, 1929  Transition [facility]) CM/SW Contact:    Bary Castilla, LCSW Phone Number:[phone] 02/01/2022, 1:41 PM  Clinical Narrative:      CSW met with patient and pt's spouse Katherine [name] to discuss PT and recommendation of a SNF. CSW explained that the original recommendation was for [facility] however it was screened out. They are agreement with going to a ST SNF. CSW discussed the SNF process.CSW provided patient with medicare.gov rating list. Katherine [name] explained that pt has been to [facility] before however does not want pt to return. They gave CSW permission to fax referrals out to local facilities as well as the facility being a 3 star or better.. They gave CSW some specific facilities that they wanted that they wanted the referrals to be sent. CSW answered questions about the SNF process and the next steps in the process. They are requesting that Katherine [name] be a part of the SNF decision.   TOC team will continue to assist with discharge planning needs.                  Expected Discharge Plan: Skilled Nursing Facility Barriers to Discharge: Ship broker, Continued Medical Work up, SNF Pending bed offer   Patient Goals and CMS Choice Patient states their goals for this hospitalization and ongoing recovery are:: To be able to return home CMS Medicare.gov Compare Post Acute Care list provided to:: Patient Represenative (must comment) (spouse) Choice offered to / list presented to : Spouse  Expected Discharge Plan and Services Expected Discharge Plan: [place] arrangements for the past 2 months: Single Family Home                                      Prior Living Arrangements/Services Living arrangements for the past 2 months: Single Family Home Lives with:: Self, Spouse Patient language and need for interpreter  reviewed:: Yes          Care giver support system in place?: Yes (comment)      Activities of Daily Living      Permission Sought/Granted   Permission granted to share information with : Yes, Verbal Permission Granted  Share Information with NAME: Katherine [name]  Permission granted to share info w AGENCY: SNFs  Permission granted to share info w Relationship: spouse  Permission granted to share info w Contact Information: [phone]  Emotional Assessment Appearance:: Appears stated age Attitude/Demeanor/Rapport: Engaged Affect (typically observed): Accepting Orientation: : Oriented to Self, Oriented to Place, Oriented to  Time, Oriented to Situation, Fluctuating Orientation (Suspected and/or reported Sundowners)      Admission diagnosis:  Closed fracture of body of sternum [S22.22XA] Closed fracture of manubrium, initial encounter [S22.21XA] Closed fracture of body of sternum, initial encounter [S22.22XA] Closed fracture of multiple ribs of right side, initial encounter [S22.41XA] Compression fracture of T7 vertebra, initial encounter ([place]) [S22.060A] Patient Active Problem List   Diagnosis Date Noted   Closed fracture of manubrium 01/30/2022   Closed post-traumatic compression fracture of T7 thoracic vertebra  01/30/2022   Fracture of ribs - right 6th & 7th 01/30/2022   Closed fracture of body of sternum 01/30/2022   Adrenal mass ([facility]) 01/30/2022   Hypokalemia 01/30/2022   QT prolongation 01/30/2022  UTI (urinary tract infection) 10/11/2021   Anxiety 10/11/2021   Altered mental status 10/11/2021   Normocytic anemia 10/11/2021   Protein-calorie malnutrition, moderate ([place]) 10/11/2021   Chronic respiratory failure with hypoxia ([place]) 08/29/2021   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   AKI (acute kidney injury) ([place]) 08/15/2021   S/P TAVR (transcatheter aortic valve replacement) 08/13/2021   Pericardial effusion 08/13/2021   Depression 07/31/2021   Severe  aortic stenosis    Iron deficiency anemia 01/22/2021   Malnutrition of mild degree ([place]) 01/27/2020   Lung nodule 08/11/2019   Painful orthopaedic hardware ([place]) 12/13/2015   Spinal stenosis of lumbar region 03/21/2015   Spondylolisthesis at L4-L5 level 08/02/2014   COPD, severe ([place]) 07/06/2012   Hyperlipidemia 12/24/2010   GE reflux 12/24/2010   Chronic back pain 12/24/2010   PCP:  Elby Showers, MD Pharmacy:   [name]: [phone]: [phone]    Social Determinants of Health (SDOH) Interventions    Readmission Risk Interventions    08/19/2021    3:21 PM  Readmission Risk Prevention Plan  Post Dischage Appt Complete  Medication Screening Complete  Transportation Screening Complete

## 2022-02-01 NOTE — NC FL2 (Signed)
[place] LEVEL OF CARE SCREENING TOOL     IDENTIFICATION  Patient Name: Katherine [name] Birthdate: [birth year] Sex: female Admission Date (Current Location): 01/29/2022  [facility] and [place] Number:  Herbalist and Address:  [facility] [place], [place] [address] [place], [place] [address]      Provider Number: [id number]  Attending Physician Name and Address:  Elmarie Shiley, MD  Relative Name and Phone Number:  [name] [phone]    Current Level of Care: SNF Recommended Level of Care: [place] Prior Approval Number:    Date Approved/Denied:   PASRR Number: [id number]  Discharge Plan: SNF    Current Diagnoses: Patient Active Problem List   Diagnosis Date Noted   Closed fracture of manubrium 01/30/2022   Closed post-traumatic compression fracture of T7 thoracic vertebra  01/30/2022   Fracture of ribs - right 6th & 7th 01/30/2022   Closed fracture of body of sternum 01/30/2022   Adrenal mass ([place]) 01/30/2022   Hypokalemia 01/30/2022   QT prolongation 01/30/2022   UTI (urinary tract infection) 10/11/2021   Anxiety 10/11/2021   Altered mental status 10/11/2021   Normocytic anemia 10/11/2021   Protein-calorie malnutrition, moderate ([place]) 10/11/2021   Chronic respiratory failure with hypoxia ([place]) 08/29/2021   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   AKI (acute kidney injury) ([place]) 08/15/2021   S/P TAVR (transcatheter aortic valve replacement) 08/13/2021   Pericardial effusion 08/13/2021   Depression 07/31/2021   Severe aortic stenosis    Iron deficiency anemia 01/22/2021   Malnutrition of mild degree ([place]) 01/27/2020   Lung nodule 08/11/2019   Painful orthopaedic hardware ([place]) 12/13/2015   Spinal stenosis of lumbar region 03/21/2015   Spondylolisthesis at L4-L5 level 08/02/2014   COPD, severe ([place]) 07/06/2012   Hyperlipidemia 12/24/2010   GE reflux 12/24/2010   Chronic back pain  12/24/2010    Orientation RESPIRATION BLADDER Height & Weight     Self, Place  O2 Incontinent Weight:   Height:     BEHAVIORAL SYMPTOMS/MOOD NEUROLOGICAL BOWEL NUTRITION STATUS      Continent Diet (See DC summary)  AMBULATORY STATUS COMMUNICATION OF NEEDS Skin   Limited Assist Verbally Skin abrasions                       Personal Care Assistance Level of Assistance  Bathing, Feeding, Dressing Bathing Assistance: Limited assistance Feeding assistance: Independent Dressing Assistance: Limited assistance     Functional Limitations Info  Sight, Hearing Sight Info: Adequate Hearing Info: Adequate      SPECIAL CARE FACTORS FREQUENCY  PT (By licensed PT), OT (By licensed OT)     PT Frequency: 5x per week OT Frequency: 5x per week            Contractures Contractures Info: Not present    Additional Factors Info  Code Status, Allergies, Psychotropic Code Status Info: Full Allergies Info: NKA Psychotropic Info: Xanex         Current Medications (02/01/2022):  This is the current hospital active medication list Current Facility-Administered Medications  Medication Dose Route Frequency Provider Last Rate Last Admin   acetaminophen (TYLENOL) tablet 1,000 mg  1,000 mg Oral Q6H Shela Leff, MD   1,000 mg at 02/01/22 1209   ALPRAZolam (XANAX) tablet 0.25 mg  0.25 mg Oral QHS Kristopher Oppenheim, DO   0.25 mg at 01/31/22 2313   aspirin EC tablet 81 mg  81 mg Oral Daily  Elgergawy, Silver Huguenin, MD   81 mg at 02/01/22 1208   bisacodyl (DULCOLAX) suppository 10 mg  10 mg Rectal Q12H PRN Shela Leff, MD       budesonide (PULMICORT) nebulizer solution 0.5 mg  0.5 mg Nebulization BID Elgergawy, Silver Huguenin, MD   0.5 mg at 02/01/22 0824   cefTRIAXone (ROCEPHIN) 1 g in sodium chloride 0.9 % 100 mL IVPB  1 g Intravenous Q24H Shela Leff, MD 200 mL/hr at 02/01/22 1004 1 g at 02/01/22 1004   cyanocobalamin (VITAMIN B12) tablet 500 mcg  500 mcg Oral Daily Elgergawy, Silver Huguenin,  MD   500 mcg at 01/31/22 0931   gabapentin (NEURONTIN) capsule 100 mg  100 mg Oral TID Shela Leff, MD   100 mg at 02/01/22 1208   heparin injection 5,000 Units  5,000 Units Subcutaneous Q8H Elgergawy, Silver Huguenin, MD   5,000 Units at 02/01/22 2330   HYDROmorphone (DILAUDID) injection 0.5-1 mg  0.5-1 mg Intravenous Q2H PRN Elgergawy, Silver Huguenin, MD       lactated ringers infusion   Intravenous Continuous Shela Leff, MD 75 mL/hr at 01/30/22 0551 New Bag at 01/30/22 0551   lidocaine (LIDODERM) 5 % 1 patch  1 patch Transdermal Q24H Florencia Reasons, MD       lip balm (CARMEX) ointment   Topical BID Shela Leff, MD   Given at 02/01/22 1209   melatonin tablet 3 mg  3 mg Oral QHS Elgergawy, Silver Huguenin, MD   3 mg at 01/31/22 2313   methocarbamol (ROBAXIN) tablet 1,000 mg  1,000 mg Oral Q6H PRN Shela Leff, MD   1,000 mg at 01/30/22 0958   naloxone (NARCAN) injection 0.4 mg  0.4 mg Intravenous PRN Shela Leff, MD       oxyCODONE (Oxy IR/ROXICODONE) immediate release tablet 2.5-10 mg  2.5-10 mg Oral Q4H PRN Shela Leff, MD   10 mg at 02/01/22 1208   pantoprazole (PROTONIX) EC tablet 40 mg  40 mg Oral Daily Elgergawy, Silver Huguenin, MD   40 mg at 02/01/22 1209   polyethylene glycol (MIRALAX / GLYCOLAX) packet 17 g  17 g Oral Daily PRN Shela Leff, MD       senna-docusate (Senokot-S) tablet 1 tablet  1 tablet Oral BID Regalado, Belkys A, MD       traMADol (ULTRAM) tablet 50 mg  50 mg Oral TID PRN Elgergawy, Silver Huguenin, MD   50 mg at 01/31/22 1142   traZODone (DESYREL) tablet 50 mg  50 mg Oral QHS PRN Elgergawy, Silver Huguenin, MD         Discharge Medications: Please see discharge summary for a list of discharge medications.  Relevant Imaging Results:  Relevant Lab Results:   Additional Information SSN# [id number]  [place], [place]

## 2022-02-01 NOTE — Progress Notes (Signed)
PROGRESS NOTE    Katherine [name]  OVZ:[id number] DOB: Oct 25, 1928 DOA: 01/29/2022 PCP: Elby Showers, MD   Brief Narrative: 88 year old with past medical history significant for history of severe COPD on 2 L of oxygen, severe aortic stenosis status post TAVR, seen by PCP last month on 7/21st after a fall and x-ray revealed acute sternal fracture.  Seen in the ED 7/26 for ongoing pain and was prescribed oral morphine.  PCP had also referred patient to pain management and prescribed tramadol.  Patient return to the ED 80/16 with ongoing pain after another fall. Patient was evaluated by trauma team, PT OT recommending rehab.  Husband unable to care of patient at home.     Assessment & Plan:   Principal Problem:   Closed fracture of body of sternum Active Problems:   COPD, severe (HCC)   Lung nodule   UTI (urinary tract infection)   Closed fracture of manubrium   Closed post-traumatic compression fracture of T7 thoracic vertebra    Fracture of ribs - right 6th & 7th   Adrenal mass (HCC)   Hypokalemia   QT prolongation   1-Frequent fall, multiple fractures: Body and manubrium of the sternum fracture, chronic compression fracture T8, T9 and T12 new compression fracture of T7. -Dr Waldron Labs discussed with neurosurgery Dr. Kathyrn Sheriff regarding T7 vertebral compression fracture, no need for surgical intervention, fracture is too high for a brace.  Patient to follow-up outpatient with neurosurgery in a few weeks if she still have severe pain. -Patient was also evaluated by trauma team who recommended multimodal pain management Scheduled Tylenol, as needed oxycodone Incentive spirometry.  Lidoderm patch  UTI: Continue with IV ceftriaxone Urine culture: Citrobacter, pan-sensitive  Hypokalemia: Replaced.   Failure to thrive She was seen by palliative care May 2023 Recommend for Palliative care to follow-up at rehab  COPD, emphysema, chronic hypoxic respiratory failure on 2 L of  oxygen at home Continue with Pulmicort DuoNebs  Stable left adrenal mass: Follow-up as an outpatient   Estimated body mass index is 17.91 kg/m as calculated from the following:   Height as of 01/08/22: '5\' 1"'$  (1.549 m).   Weight as of 01/08/22: 43 kg.   DVT prophylaxis: Heparin Code Status: Full code Family Communication: Care discussed with husband who was at bedside Disposition Plan:  Status is: Inpatient Remains inpatient appropriate because: pain management, UTI>  Plan for SNF, SW consulted.     Consultants:  Trauma team.  Procedures:    Antimicrobials:    Subjective: She report no BM  Nausea improved, plan to advance diet.   Objective: Vitals:   01/31/22 2022 01/31/22 2318 02/01/22 0611 02/01/22 0824  BP:  (!) 152/67 (!) 105/46   Pulse: 94     Resp: 18  17   Temp:   97.8 F (36.6 C)   TempSrc:   Axillary   SpO2: 97%   98%    Intake/Output Summary (Last 24 hours) at 02/01/2022 1630 Last data filed at 01/31/2022 2300 Gross per 24 hour  Intake --  Output 700 ml  Net -700 ml   There were no vitals filed for this visit.  Examination:  General exam: Appears calm and comfortable  Respiratory system: Clear to auscultation. Respiratory effort normal. Cardiovascular system: S1 & S2 heard, RRR. No JVD, murmurs, rubs, gallops or clicks. No pedal edema. Gastrointestinal system: Abdomen is nondistended, soft and nontender. No organomegaly or masses felt. Normal bowel sounds heard. Central nervous system: Alert and oriented. No focal  neurological deficits. Extremities: Symmetric 5 x 5 power. Skin: No rashes, lesions or ulcers Psychiatry: Judgement and insight appear normal. Mood & affect appropriate.     Data Reviewed: I have personally reviewed following labs and imaging studies  CBC: Recent Labs  Lab 01/29/22 2306 01/30/22 1610 02/01/22 0237  WBC 10.3 9.6 7.0  NEUTROABS 8.6*  --  4.2  HGB 10.9* 10.3* 10.3*  HCT 34.4* 33.2* 32.1*  MCV 92.2 94.3 92.8   PLT 169 186 287   Basic Metabolic Panel: Recent Labs  Lab 01/29/22 2306 01/30/22 1610 01/31/22 2244 02/01/22 0237  NA 140 136 137  --   K 2.9* 4.5 4.1  --   CL 100 108 102  --   CO2 '31 24 28  '$ --   GLUCOSE 128* 258* 80  --   BUN '17 14 19  '$ --   CREATININE 1.11* 0.97 1.30*  --   CALCIUM 9.1 7.3* 8.9  --   MG 1.7  --   --  2.1  PHOS 2.4*  --   --  4.1   GFR: CrCl cannot be calculated (Unknown ideal weight.). Liver Function Tests: No results for input(s): "AST", "ALT", "ALKPHOS", "BILITOT", "PROT", "ALBUMIN" in the last 168 hours. No results for input(s): "LIPASE", "AMYLASE" in the last 168 hours. No results for input(s): "AMMONIA" in the last 168 hours. Coagulation Profile: No results for input(s): "INR", "PROTIME" in the last 168 hours. Cardiac Enzymes: No results for input(s): "CKTOTAL", "CKMB", "CKMBINDEX", "TROPONINI" in the last 168 hours. BNP (last 3 results) No results for input(s): "PROBNP" in the last 8760 hours. HbA1C: No results for input(s): "HGBA1C" in the last 72 hours. CBG: No results for input(s): "GLUCAP" in the last 168 hours. Lipid Profile: No results for input(s): "CHOL", "HDL", "LDLCALC", "TRIG", "CHOLHDL", "LDLDIRECT" in the last 72 hours. Thyroid Function Tests: No results for input(s): "TSH", "T4TOTAL", "FREET4", "T3FREE", "THYROIDAB" in the last 72 hours. Anemia Panel: No results for input(s): "VITAMINB12", "FOLATE", "FERRITIN", "TIBC", "IRON", "RETICCTPCT" in the last 72 hours. Sepsis Labs: No results for input(s): "PROCALCITON", "LATICACIDVEN" in the last 168 hours.  Recent Results (from the past 240 hour(s))  Urine Culture     Status: Abnormal   Collection Time: 01/30/22 10:49 AM   Specimen: Urine, Clean Catch  Result Value Ref Range Status   Specimen Description   Final    URINE, CLEAN CATCH Performed at [facility], [place] [address] [place], [place] [address]    Special Requests   Final    NONE Performed at [facility], [place] [address] [place], [place] [address]    Culture >=100,000 COLONIES/mL CITROBACTER AMALONATICUS (A)  Final   Report Status 02/01/2022 FINAL  Final   Organism ID, Bacteria CITROBACTER AMALONATICUS (A)  Final      Susceptibility   Citrobacter amalonaticus - MIC*    CEFAZOLIN <=4 SENSITIVE Sensitive     CEFEPIME <=0.12 SENSITIVE Sensitive     CEFTRIAXONE <=0.25 SENSITIVE Sensitive     CIPROFLOXACIN <=0.25 SENSITIVE Sensitive     GENTAMICIN <=1 SENSITIVE Sensitive     IMIPENEM <=0.25 SENSITIVE Sensitive     NITROFURANTOIN 32 SENSITIVE Sensitive     TRIMETH/SULFA <=20 SENSITIVE Sensitive     PIP/TAZO <=4 SENSITIVE Sensitive     * >=100,000 COLONIES/mL CITROBACTER AMALONATICUS         Radiology Studies: No results found.      Scheduled Meds:  acetaminophen  1,000 mg Oral Q6H  ALPRAZolam  0.25 mg Oral QHS   aspirin EC  81 mg Oral Daily   budesonide  0.5 mg Nebulization BID   cyanocobalamin  500 mcg Oral Daily   gabapentin  100 mg Oral TID   heparin injection (subcutaneous)  5,000 Units Subcutaneous Q8H   lidocaine  1 patch Transdermal Q24H   lip balm   Topical BID   melatonin  3 mg Oral QHS   pantoprazole  40 mg Oral Daily   senna-docusate  1 tablet Oral BID   Continuous Infusions:  cefTRIAXone (ROCEPHIN)  IV 1 g (02/01/22 1004)   lactated ringers 75 mL/hr at 01/30/22 0551     LOS: 2 days    Time spent: 35 Minutes.     Elmarie Shiley, MD [facility]   If 7PM-7AM, please contact night-coverage [email or web address]  02/01/2022, 4:30 PM

## 2022-02-02 ENCOUNTER — Inpatient Hospital Stay (HOSPITAL_COMMUNITY): Payer: Medicare PPO

## 2022-02-02 DIAGNOSIS — S2222XA Fracture of body of sternum, initial encounter for closed fracture: Secondary | ICD-10-CM | POA: Diagnosis not present

## 2022-02-02 LAB — BASIC METABOLIC PANEL
Anion gap: 9 (ref 5–15)
BUN: 19 mg/dL (ref 8–23)
CO2: 22 mmol/L (ref 22–32)
Calcium: 8.6 mg/dL — ABNORMAL LOW (ref 8.9–10.3)
Chloride: 105 mmol/L (ref 98–111)
Creatinine, Ser: 1.1 mg/dL — ABNORMAL HIGH (ref 0.44–1.00)
GFR, Estimated: 47 mL/min — ABNORMAL LOW (ref >60)
Glucose, Bld: 80 mg/dL (ref 70–99)
Potassium: 3.9 mmol/L (ref 3.5–5.1)
Sodium: 136 mmol/L (ref 135–145)

## 2022-02-02 LAB — CBC
HCT: 32.4 % — ABNORMAL LOW (ref 36.0–46.0)
Hemoglobin: 10 g/dL — ABNORMAL LOW (ref 12.0–15.0)
MCH: 29.1 pg (ref 26.0–34.0)
MCHC: 30.9 g/dL (ref 30.0–36.0)
MCV: 94.2 fL (ref 80.0–100.0)
Platelets: 187 10*3/uL (ref 150–400)
RBC: 3.44 MIL/uL — ABNORMAL LOW (ref 3.87–5.11)
RDW: 13.1 % (ref 11.5–15.5)
WBC: 6.9 10*3/uL (ref 4.0–10.5)
nRBC: 0 % (ref 0.0–0.2)

## 2022-02-02 MED ORDER — NYSTATIN 100000 UNIT/ML MT SUSP
5.0000 mL | Freq: Four times a day (QID) | OROMUCOSAL | Status: DC
  Administered 2022-02-02 – 2022-02-04 (×6): 500000 [IU] via ORAL
  Filled 2022-02-02 (×5): qty 5

## 2022-02-02 MED ORDER — ENSURE ENLIVE PO LIQD
237.0000 mL | Freq: Two times a day (BID) | ORAL | Status: DC
  Administered 2022-02-02 – 2022-02-03 (×3): 237 mL via ORAL

## 2022-02-02 MED ORDER — POLYETHYLENE GLYCOL 3350 17 G PO PACK
17.0000 g | PACK | Freq: Every day | ORAL | Status: DC
  Administered 2022-02-02: 17 g via ORAL
  Filled 2022-02-02 (×2): qty 1

## 2022-02-02 MED ORDER — GABAPENTIN 100 MG PO CAPS
100.0000 mg | ORAL_CAPSULE | Freq: Three times a day (TID) | ORAL | Status: DC
  Administered 2022-02-02 – 2022-02-04 (×8): 100 mg via ORAL
  Filled 2022-02-02 (×7): qty 1

## 2022-02-02 MED ORDER — FLEET ENEMA 7-19 GM/118ML RE ENEM
1.0000 | ENEMA | Freq: Once | RECTAL | Status: AC
Start: 2022-02-02 — End: 2022-02-02
  Administered 2022-02-02: 1 via RECTAL
  Filled 2022-02-02: qty 1

## 2022-02-02 NOTE — Progress Notes (Signed)
Pt had coughing and difficulty clearing secretions after taking nystatin followed by a drink of water. Increased WOB concerning for aspiration.  Sats are currently 95 % on 2 Liters. She has been able to cough up some clear secretions and has relaxed somewhat. Breath sounds are a little diminished but I only heard faint wheezes. Dr Tyrell Antonio notified.

## 2022-02-02 NOTE — Progress Notes (Addendum)
PROGRESS NOTE    Katherine [name]  BDZ:[id number] DOB: [birth year] DOA: 01/29/2022 PCP: Elby Showers, MD   Brief Narrative: 88 year old with past medical history significant for history of severe COPD on 2 L of oxygen, severe aortic stenosis status post TAVR, seen by PCP last month on 7/21st after a fall and x-ray revealed acute sternal fracture.  Seen in the ED 7/26 for ongoing pain and was prescribed oral morphine.  PCP had also referred patient to pain management and prescribed tramadol.  Patient return to the ED 8/16 with ongoing pain after another fall. Patient was evaluated by trauma team, PT OT recommending rehab.  Husband unable to care of patient at home. Plan for rehab.    Assessment & Plan:   Principal Problem:   Closed fracture of body of sternum Active Problems:   COPD, severe (HCC)   Lung nodule   UTI (urinary tract infection)   Closed fracture of manubrium   Closed post-traumatic compression fracture of T7 thoracic vertebra    Fracture of ribs - right 6th & 7th   Adrenal mass (HCC)   Hypokalemia   QT prolongation   1-Frequent fall, multiple fractures: Body and manubrium of the sternum fracture, chronic compression fracture T8, T9 and T12 new compression fracture of T7. -Dr Waldron Labs discussed with neurosurgery Dr. Kathyrn Sheriff regarding T7 vertebral compression fracture, no need for surgical intervention, fracture is too high for a brace.  Patient to follow-up outpatient with neurosurgery in a few weeks if she still have severe pain. -Patient was also evaluated by trauma team who recommended multimodal pain management. Scheduled Tylenol, as needed oxycodone Incentive spirometry.  Lidoderm patch Pain controlled.  Awaiting rehab.   UTI: Continue with IV ceftriaxone day 4/5. Urine culture: Citrobacter, pan-sensitive  Hypokalemia: Replaced.   Failure to thrive She was seen by palliative care May 2023 Recommend for Palliative care to follow-up at rehab Start  ensure.  Treat oral thrush.  Treat constipation.   Constipation:  KUB showed : Nonobstructive bowel gas pattern with moderate fecal retention throughout the colon.  Fleet enema ordered. Senna, Miralax.   COPD, emphysema, chronic hypoxic respiratory failure on 2 L of oxygen at home Continue with Pulmicort DuoNebs  Stable left adrenal mass: Follow-up as an outpatient  Aspiration event; Patient had coughing spell today per nurse report after drinking water.  Came to see patient she is in no distress. Lungs clear.  Chest x ray Negative.  Monitor.  Seech swallow.    Estimated body mass index is 17.91 kg/m as calculated from the following:   Height as of 01/08/22: '5\' 1"'$  (1.549 m).   Weight as of 01/08/22: 43 kg.   DVT prophylaxis: Heparin Code Status: discussed with patient nad husband , she wishes to be DNR>  Family Communication: Care discussed with husband who was at bedside Disposition Plan:  Status is: Inpatient Remains inpatient appropriate because: pain management, UTI>  Plan for SNF, SW consulted.     Consultants:  Trauma team.  Procedures:    Antimicrobials:    Subjective: No BM since admission.  Pain is controlled. She has poor appetite.    Objective: Vitals:   02/01/22 1756 02/01/22 1920 02/01/22 2256 02/02/22 0620  BP: (!) 93/55 (!) 93/55 (!) 113/48 (!) 117/54  Pulse: 82 82 78 80  Resp: '20 18 16 15  '$ Temp: 97.6 F (36.4 C)  98.1 F (36.7 C) 98 F (36.7 C)  TempSrc: Oral  Oral Axillary  SpO2: 100% 99% 99% 100%  Intake/Output Summary (Last 24 hours) at 02/02/2022 1051 Last data filed at 02/02/2022 0700 Gross per 24 hour  Intake 920 ml  Output 600 ml  Net 320 ml    There were no vitals filed for this visit.  Examination:  General exam: calm.  Respiratory system: CTA Cardiovascular system: S 1, S 2 RRR Gastrointestinal system: BS present, soft, nt Central nervous system: Alert.  Extremities: no edema   Data Reviewed: I have  personally reviewed following labs and imaging studies  CBC: Recent Labs  Lab 01/29/22 2306 01/30/22 1610 02/01/22 0237 02/02/22 0801  WBC 10.3 9.6 7.0 6.9  NEUTROABS 8.6*  --  4.2  --   HGB 10.9* 10.3* 10.3* 10.0*  HCT 34.4* 33.2* 32.1* 32.4*  MCV 92.2 94.3 92.8 94.2  PLT 169 186 180 494    Basic Metabolic Panel: Recent Labs  Lab 01/29/22 2306 01/30/22 1610 01/31/22 2244 02/01/22 0237 02/02/22 0801  NA 140 136 137  --  136  K 2.9* 4.5 4.1  --  3.9  CL 100 108 102  --  105  CO2 '31 24 28  '$ --  22  GLUCOSE 128* 258* 80  --  80  BUN '17 14 19  '$ --  19  CREATININE 1.11* 0.97 1.30*  --  1.10*  CALCIUM 9.1 7.3* 8.9  --  8.6*  MG 1.7  --   --  2.1  --   PHOS 2.4*  --   --  4.1  --     GFR: CrCl cannot be calculated (Unknown ideal weight.). Liver Function Tests: No results for input(s): "AST", "ALT", "ALKPHOS", "BILITOT", "PROT", "ALBUMIN" in the last 168 hours. No results for input(s): "LIPASE", "AMYLASE" in the last 168 hours. No results for input(s): "AMMONIA" in the last 168 hours. Coagulation Profile: No results for input(s): "INR", "PROTIME" in the last 168 hours. Cardiac Enzymes: No results for input(s): "CKTOTAL", "CKMB", "CKMBINDEX", "TROPONINI" in the last 168 hours. BNP (last 3 results) No results for input(s): "PROBNP" in the last 8760 hours. HbA1C: No results for input(s): "HGBA1C" in the last 72 hours. CBG: No results for input(s): "GLUCAP" in the last 168 hours. Lipid Profile: No results for input(s): "CHOL", "HDL", "LDLCALC", "TRIG", "CHOLHDL", "LDLDIRECT" in the last 72 hours. Thyroid Function Tests: No results for input(s): "TSH", "T4TOTAL", "FREET4", "T3FREE", "THYROIDAB" in the last 72 hours. Anemia Panel: No results for input(s): "VITAMINB12", "FOLATE", "FERRITIN", "TIBC", "IRON", "RETICCTPCT" in the last 72 hours. Sepsis Labs: No results for input(s): "PROCALCITON", "LATICACIDVEN" in the last 168 hours.  Recent Results (from the past 240  hour(s))  Urine Culture     Status: Abnormal   Collection Time: 01/30/22 10:49 AM   Specimen: Urine, Clean Catch  Result Value Ref Range Status   Specimen Description   Final    URINE, CLEAN CATCH Performed at [facility], [place] [address], [place], [place] [address]    Special Requests   Final    NONE Performed at [facility], [place] [address], [place], [place] [address]    Culture >=100,000 COLONIES/mL CITROBACTER AMALONATICUS (A)  Final   Report Status 02/01/2022 FINAL  Final   Organism ID, Bacteria CITROBACTER AMALONATICUS (A)  Final      Susceptibility   Citrobacter amalonaticus - MIC*    CEFAZOLIN <=4 SENSITIVE Sensitive     CEFEPIME <=0.12 SENSITIVE Sensitive     CEFTRIAXONE <=0.25 SENSITIVE Sensitive     CIPROFLOXACIN <=0.25 SENSITIVE Sensitive  GENTAMICIN <=1 SENSITIVE Sensitive     IMIPENEM <=0.25 SENSITIVE Sensitive     NITROFURANTOIN 32 SENSITIVE Sensitive     TRIMETH/SULFA <=20 SENSITIVE Sensitive     PIP/TAZO <=4 SENSITIVE Sensitive     * >=100,000 COLONIES/mL CITROBACTER AMALONATICUS         Radiology Studies: No results found.      Scheduled Meds:  acetaminophen  1,000 mg Oral Q6H   ALPRAZolam  0.25 mg Oral QHS   aspirin EC  81 mg Oral Daily   budesonide  0.5 mg Nebulization BID   cyanocobalamin  500 mcg Oral Daily   feeding supplement  237 mL Oral BID BM   gabapentin  100 mg Oral TID   heparin injection (subcutaneous)  5,000 Units Subcutaneous Q8H   lidocaine  1 patch Transdermal Q24H   lip balm   Topical BID   melatonin  3 mg Oral QHS   nystatin  5 mL Oral QID   pantoprazole  40 mg Oral Daily   polyethylene glycol  17 g Oral Daily   senna-docusate  1 tablet Oral BID   Continuous Infusions:  cefTRIAXone (ROCEPHIN)  IV 1 g (02/02/22 0916)   lactated ringers 75 mL/hr at 01/30/22 0551     LOS: 3 days    Time spent: 35 Minutes.     Elmarie Shiley, MD [facility]   If 7PM-7AM, please  contact night-coverage [email or web address]  02/02/2022, 10:51 AM

## 2022-02-03 ENCOUNTER — Other Ambulatory Visit: Payer: Self-pay

## 2022-02-03 ENCOUNTER — Inpatient Hospital Stay (HOSPITAL_COMMUNITY): Payer: Medicare PPO

## 2022-02-03 DIAGNOSIS — E278 Other specified disorders of adrenal gland: Secondary | ICD-10-CM

## 2022-02-03 DIAGNOSIS — J449 Chronic obstructive pulmonary disease, unspecified: Secondary | ICD-10-CM | POA: Diagnosis not present

## 2022-02-03 DIAGNOSIS — S2221XK Fracture of manubrium, subsequent encounter for fracture with nonunion: Secondary | ICD-10-CM

## 2022-02-03 DIAGNOSIS — S2222XA Fracture of body of sternum, initial encounter for closed fracture: Secondary | ICD-10-CM | POA: Diagnosis not present

## 2022-02-03 DIAGNOSIS — R911 Solitary pulmonary nodule: Secondary | ICD-10-CM

## 2022-02-03 MED ORDER — SORBITOL 70 % SOLN
960.0000 mL | TOPICAL_OIL | Freq: Once | ORAL | Status: DC
  Filled 2022-02-03: qty 473

## 2022-02-03 MED ORDER — POLYETHYLENE GLYCOL 3350 17 G PO PACK
17.0000 g | PACK | Freq: Two times a day (BID) | ORAL | Status: DC
  Administered 2022-02-03 – 2022-02-04 (×3): 17 g via ORAL
  Filled 2022-02-03 (×2): qty 1

## 2022-02-03 MED ORDER — BISACODYL 10 MG RE SUPP
10.0000 mg | Freq: Once | RECTAL | Status: AC
  Administered 2022-02-03: 10 mg via RECTAL
  Filled 2022-02-03: qty 1

## 2022-02-03 MED ORDER — DOCUSATE SODIUM 100 MG PO CAPS
100.0000 mg | ORAL_CAPSULE | Freq: Two times a day (BID) | ORAL | Status: DC
  Administered 2022-02-03 – 2022-02-04 (×3): 100 mg via ORAL
  Filled 2022-02-03 (×3): qty 1

## 2022-02-03 NOTE — Evaluation (Signed)
Clinical/Bedside Swallow Evaluation Patient Details  Name: Katherine [name] MRN: [id number] Date of Birth: 1928/09/15  Today's Date: 02/03/2022 Time: SLP Start Time (ACUTE ONLY): 6195 SLP Stop Time (ACUTE ONLY): 0946 SLP Time Calculation (min) (ACUTE ONLY): 15 min  Past Medical History:  Past Medical History:  Diagnosis Date   Allergy    Anemia    Arthritis    Asthma    patient denies   B12 deficiency    COPD (chronic obstructive pulmonary disease) ([place])    Depression    not recent   Diverticulosis    Esophageal stricture    Hepatic cyst 01/30/2010   Hiatal hernia 1985   patient unaware   Hx of adenomatous colonic polyps 2006   Hyperlipidemia    IBS (irritable bowel syndrome)    PONV (postoperative nausea and vomiting)    no nausea or vomitting after surgery 07/2014   Positive PPD    PUD (peptic ulcer disease)    S/P TAVR (transcatheter aortic valve replacement) 08/13/2021   s/p TAVR with a 16m Edwards S3UR via the right carotid approach by Dr. MAngelena Form& Dr. BCyndia Bent  Severe aortic stenosis    Past Surgical History:  Past Surgical History:  Procedure Laterality Date   ABDOMINAL HYSTERECTOMY     abnormal bleeding   APPENDECTOMY     BARTHOLIN GLAND CYST EXCISION     x2   BREAST BIOPSY Bilateral    Milk glnd   CATARACT EXTRACTION W/ INTRAOCULAR LENS  IMPLANT, BILATERAL Bilateral    COLONOSCOPY     COLONOSCOPY W/ POLYPECTOMY     HARDWARE REMOVAL N/A 12/13/2015   Procedure: Removal of HARDWARE  Lumbar Five-Sacral One ;  Surgeon: GKary Kos MD;  [facility];  Service: Neurosurgery;  Laterality: N/A;   IR KYPHO LUMBAR INC FX REDUCE BONE BX UNI/BIL CANNULATION INC/IMAGING  07/13/2019   PERICARDIAL WINDOW  08/13/2021   Procedure: PERICARDIAL WINDOW;  Surgeon: MBurnell Blanks MD;  [place] [facility]  Service: Open Heart Surgery;;   RIGHT/LEFT HEART CATH AND CORONARY ANGIOGRAPHY N/A 07/11/2021   Procedure: RIGHT/LEFT HEART CATH AND CORONARY ANGIOGRAPHY;   Surgeon: MBurnell Blanks MD;  [facility];  Service: Cardiovascular;  Laterality: N/A;   TEE WITHOUT CARDIOVERSION N/A 08/13/2021   Procedure: TRANSESOPHAGEAL ECHOCARDIOGRAM (TEE);  Surgeon: MBurnell Blanks MD;  [place] [facility]  Service: Open Heart Surgery;  Laterality: N/A;   TRANSCATHETER AORTIC VALVE REPLACEMENT, CAROTID Right 08/13/2021   Procedure: Transcatheter Aortic Valve Replacement-Carotid;  Surgeon: MBurnell Blanks MD;  [place] [facility]  Service: Open Heart Surgery;  Laterality: Right;   UPPER GI ENDOSCOPY     VAGOTOMY AND PYLOROPLASTY     in w36 [place]   HPI:  88year old with past medical history significant for history of severe COPD on 2 L of oxygen, esophageal stricture, hiatal hernia, severe aortic stenosis status post TAVR, fall 7/21 acute sternal fracture.  Admitted 8/16 with ongoing pain after another fall. Pt referred after coughing with Nystatin and water, increased WOB concerning for aspiriton.    Assessment / Plan / Recommendation  Clinical Impression   Pt alert with husband present. Pt referred for coughng event during a meal, however, reports she just had something stuck in her throat. Husband reports pt takes small sips/bites during meals and has no difficulty with swallowing previously. Oral-mech was unremarkable, oral mastication and violitional swallow were WFL. However, one delayed cough with thin liquids and mildly increased WOB while eating  were present. Educated pt on importance of rest breaks as needed due to respiratory status. Recommend continue diet of regular consistancy and thin liquids and refrain from taking more than two pills at once.    SLP Visit Diagnosis: Dysphagia, unspecified (R13.10)    Aspiration Risk  Mild aspiration risk    Diet Recommendation Regular;Thin liquid   Liquid Administration via: Straw;Cup Medication Administration: Whole meds with liquid Supervision: Patient able to self  feed Postural Changes: Seated upright at 90 degrees    Other  Recommendations Oral Care Recommendations: Oral care BID    Recommendations for follow up therapy are one component of a multi-disciplinary discharge planning process, led by the attending physician.  Recommendations may be updated based on patient status, additional functional criteria and insurance authorization.  Follow up Recommendations No SLP follow up      Assistance Recommended at Discharge None  Functional Status Assessment    Frequency and Duration            Prognosis        Swallow Study   General Date of Onset: 01/29/22 HPI: 88 year old with past medical history significant for history of severe COPD on 2 L of oxygen, esophageal stricture, hiatal hernia, severe aortic stenosis status post TAVR, fall 7/21 acute sternal fracture.  Admitted 8/16 with ongoing pain after another fall. Pt referred after coughing with Nystatin and water, increased WOB concerning for aspiriton. Type of Study: Bedside Swallow Evaluation Previous Swallow Assessment:  (none) Diet Prior to this Study: Regular;Thin liquids Temperature Spikes Noted: No Respiratory Status: Nasal cannula History of Recent Intubation: No Behavior/Cognition: Alert;Cooperative;Pleasant mood Oral Cavity Assessment: Dried secretions;Dry Oral Care Completed by SLP: No Oral Cavity - Dentition: Adequate natural dentition Vision: Functional for self-feeding Self-Feeding Abilities: Able to feed self Patient Positioning: Upright in chair Baseline Vocal Quality: Normal Volitional Cough: Strong Volitional Swallow: Able to elicit    Oral/Motor/Sensory Function Overall Oral Motor/Sensory Function: Within functional limits   Ice Chips Ice chips: Not tested   Thin Liquid Thin Liquid: Within functional limits Presentation: Cup;Straw    Nectar Thick Nectar Thick Liquid: Not tested   Honey Thick Honey Thick Liquid: Not tested   Puree Puree: Within functional  limits Presentation: Self Fed;Spoon   Solid     Solid: Within functional limits Presentation: [place] Student SLP  02/03/2022,10:29 AM

## 2022-02-03 NOTE — TOC Progression Note (Addendum)
Transition of Care [facility]) - Progression Note    Patient Details  Name: Katherine [name] MRN: [id number] Date of Birth: 05-13-1929  Transition of Care [facility]) CM/SW [place], [place] Phone Number: 02/03/2022, 12:43 PM  Clinical Narrative:     Update- CSW started insurance authorization for patient reference # [id number].   Update- CSW heard back from [name] can offer semi private room tomorrow if medically ready and [facility] has no current beds available. CSW informed patients spouse. Patients Spouse asked if [name] room tomorrow. CSW spoke with [name] with [name] who confirmed private room tomorrow for patient. Patients spouse chose SNF bed at [facility]. [name] with [name] confirmed they can accept patient tomorrow if medically ready. CSW awaiting current PT note to start insurance authorization for patient.  CSW spoke with [name] patients spouse regarding SNF bed offers. [name] informed CSW that number one choice for SNF bed is with [name] then [name]. CSW informed patients spouse will check to see if [name] and [facility] can offer and will call patients spouse back. CSW reached out to [place] with [name] and [place] with [name] awaiting to hear back to see if they can offer SNF bed for patient.CSW will continue to follow and assist with patients dc planning needs.  Expected Discharge Plan: Skilled Nursing Facility Barriers to Discharge: Ship broker, Continued Medical Work up, SNF Pending bed offer  Expected Discharge Plan and Services Expected Discharge Plan: [place] arrangements for the past 2 months: Single Family Home                                       Social Determinants of Health (SDOH) Interventions    Readmission Risk Interventions    08/19/2021    3:21 PM  Readmission Risk Prevention Plan  Post Dischage Appt Complete  Medication Screening Complete  Transportation Screening  Complete

## 2022-02-03 NOTE — Progress Notes (Signed)
Pt more confused tonight. Per shift report pt has been like this most of the day. Tonight after taking her PM medications pt opened her full cup of water and threw it at me, when I questioned her as to why she just did that she stated "your trying to kill me with those pills". Pt oriented to self only at this time. Will continue to monitor. Jessie Foot, RN

## 2022-02-03 NOTE — Progress Notes (Signed)
PROGRESS NOTE    Katherine [name]  TWS:[id number] DOB: 31-Oct-1928 DOA: 01/29/2022 PCP: Elby Showers, MD   Brief Narrative:  88 year old with past medical history significant for history of severe COPD on 2 L of oxygen, severe aortic stenosis status post TAVR, seen by PCP last month on 7/21st after a fall and x-ray revealed acute sternal fracture.  Seen in the ED 7/26 for ongoing pain and was prescribed oral morphine.  PCP had also referred patient to pain management and prescribed tramadol.  Patient return to the ED 8/16 with ongoing pain after another fall. Patient was evaluated by trauma team, PT OT recommending rehab.  Husband unable to care of patient at home. Plan for rehab.    Assessment & Plan:  Principal Problem:   Closed fracture of body of sternum Active Problems:   COPD, severe (HCC)   Lung nodule   UTI (urinary tract infection)   Closed fracture of manubrium   Closed post-traumatic compression fracture of T7 thoracic vertebra    Fracture of ribs - right 6th & 7th   Adrenal mass (HCC)   Hypokalemia   QT prolongation    1-Frequent fall, multiple fractures: Body and manubrium of the sternum fracture, chronic compression fracture T8, T9 and T12 new compression fracture of T7. -Dr Waldron Labs discussed with neurosurgery Dr. Kathyrn Sheriff regarding T7 vertebral compression fracture, no need for surgical intervention, fracture is too high for a brace.  Patient to follow-up outpatient with neurosurgery in a few weeks if she still have severe pain.  Pain is currently better controlled on current regimen.  Advised to use aggressive incentive spirometer, lidocaine patch.   UTI: Last day of Rocephin today Urine culture: Citrobacter, pan-sensitive   Hypokalemia: Replaced.    Adult failure to thrive Start oral supplements.   Constipation:  Aggressive bowel regimen has been ordered   COPD, emphysema, chronic hypoxic respiratory failure on 2 L of oxygen at home Continue with  Pulmicort DuoNebs   Stable left adrenal mass: Follow-up as an outpatient   Aspiration event; Patient has been seen by speech.  Mild aspiration risk.     Estimated body mass index is 17.91 kg/m as calculated from the following:   Height as of 01/08/22: '5\' 1"'$  (1.549 m).   Weight as of 01/08/22: 43 kg.          DVT prophylaxis: heparin injection 5,000 Units Start: 01/30/22 2200 SCDs Start: 01/30/22 0747 Code Status: DNR Family Communication: None at bedside  Status is: Inpatient Patient is medically doing well, currently awaiting placement  Subjective: Seen and examined at bedside, does not have any new complaints.   Examination:  General exam: Chronically ill and frail appearing Respiratory system: Clear to auscultation. Respiratory effort normal. Cardiovascular system: S1 & S2 heard, RRR. No JVD, murmurs, rubs, gallops or clicks. No pedal edema. Gastrointestinal system: Abdomen is nondistended, soft and nontender. No organomegaly or masses felt. Normal bowel sounds heard. Central nervous system: Alert and oriented. No focal neurological deficits. Extremities: Symmetric 4 x 5 power. Skin: No rashes, lesions or ulcers Psychiatry: Judgement and insight appear normal. Mood & affect appropriate.   external female catheter in place  Objective: Vitals:   02/02/22 1930 02/03/22 0522 02/03/22 0745 02/03/22 1134  BP: (!) 134/59 (!) 169/67  (!) 149/60  Pulse: 86  88 85  Resp: '16 16 18 16  '$ Temp:  98 F (36.7 C)  98 F (36.7 C)  TempSrc:  Oral  Oral  SpO2:   96% 100%  Intake/Output Summary (Last 24 hours) at 02/03/2022 1317 Last data filed at 02/03/2022 0500 Gross per 24 hour  Intake 1515 ml  Output 1100 ml  Net 415 ml   There were no vitals filed for this visit.   Data Reviewed:   CBC: Recent Labs  Lab 01/29/22 2306 01/30/22 1610 02/01/22 0237 02/02/22 0801  WBC 10.3 9.6 7.0 6.9  NEUTROABS 8.6*  --  4.2  --   HGB 10.9* 10.3* 10.3* 10.0*  HCT 34.4*  33.2* 32.1* 32.4*  MCV 92.2 94.3 92.8 94.2  PLT 169 186 180 211   Basic Metabolic Panel: Recent Labs  Lab 01/29/22 2306 01/30/22 1610 01/31/22 2244 02/01/22 0237 02/02/22 0801  NA 140 136 137  --  136  K 2.9* 4.5 4.1  --  3.9  CL 100 108 102  --  105  CO2 '31 24 28  '$ --  22  GLUCOSE 128* 258* 80  --  80  BUN '17 14 19  '$ --  19  CREATININE 1.11* 0.97 1.30*  --  1.10*  CALCIUM 9.1 7.3* 8.9  --  8.6*  MG 1.7  --   --  2.1  --   PHOS 2.4*  --   --  4.1  --    GFR: CrCl cannot be calculated (Unknown ideal weight.). Liver Function Tests: No results for input(s): "AST", "ALT", "ALKPHOS", "BILITOT", "PROT", "ALBUMIN" in the last 168 hours. No results for input(s): "LIPASE", "AMYLASE" in the last 168 hours. No results for input(s): "AMMONIA" in the last 168 hours. Coagulation Profile: No results for input(s): "INR", "PROTIME" in the last 168 hours. Cardiac Enzymes: No results for input(s): "CKTOTAL", "CKMB", "CKMBINDEX", "TROPONINI" in the last 168 hours. BNP (last 3 results) No results for input(s): "PROBNP" in the last 8760 hours. HbA1C: No results for input(s): "HGBA1C" in the last 72 hours. CBG: No results for input(s): "GLUCAP" in the last 168 hours. Lipid Profile: No results for input(s): "CHOL", "HDL", "LDLCALC", "TRIG", "CHOLHDL", "LDLDIRECT" in the last 72 hours. Thyroid Function Tests: No results for input(s): "TSH", "T4TOTAL", "FREET4", "T3FREE", "THYROIDAB" in the last 72 hours. Anemia Panel: No results for input(s): "VITAMINB12", "FOLATE", "FERRITIN", "TIBC", "IRON", "RETICCTPCT" in the last 72 hours. Sepsis Labs: No results for input(s): "PROCALCITON", "LATICACIDVEN" in the last 168 hours.  Recent Results (from the past 240 hour(s))  Urine Culture     Status: Abnormal   Collection Time: 01/30/22 10:49 AM   Specimen: Urine, Clean Catch  Result Value Ref Range Status   Specimen Description   Final    URINE, CLEAN CATCH Performed at [facility],  [place] [address], [place], [place] [address]    Special Requests   Final    NONE Performed at [facility], [place] [address] [place], [place] [address]    Culture >=100,000 COLONIES/mL CITROBACTER AMALONATICUS (A)  Final   Report Status 02/01/2022 FINAL  Final   Organism ID, Bacteria CITROBACTER AMALONATICUS (A)  Final      Susceptibility   Citrobacter amalonaticus - MIC*    CEFAZOLIN <=4 SENSITIVE Sensitive     CEFEPIME <=0.12 SENSITIVE Sensitive     CEFTRIAXONE <=0.25 SENSITIVE Sensitive     CIPROFLOXACIN <=0.25 SENSITIVE Sensitive     GENTAMICIN <=1 SENSITIVE Sensitive     IMIPENEM <=0.25 SENSITIVE Sensitive     NITROFURANTOIN 32 SENSITIVE Sensitive     TRIMETH/SULFA <=20 SENSITIVE Sensitive     PIP/TAZO <=4 SENSITIVE Sensitive     * >=100,000  COLONIES/mL CITROBACTER AMALONATICUS         Radiology Studies: DG CHEST PORT 1 VIEW  Result Date: 02/02/2022 CLINICAL DATA:  Dyspnea. EXAM: PORTABLE CHEST 1 VIEW COMPARISON:  01/08/2022 and older exams.  CT, 01/30/2022. FINDINGS: Cardiac silhouette is normal in size. Stable prosthetic aortic valve. No mediastinal or hilar masses. Lungs are hyperexpanded. There are prominent interstitial markings and relative lucency in the upper lobes. No lung consolidation. No evidence of edema. No pleural effusion or pneumothorax. Skeletal structures are grossly intact. IMPRESSION: 1. No acute cardiopulmonary disease. 2. COPD/emphysema. Electronically Signed   By: Lajean Manes M.D.   On: 02/02/2022 16:22   DG Abd 1 View  Result Date: 02/02/2022 CLINICAL DATA:  Left lower quadrant pain and constipation. EXAM: ABDOMEN - 1 VIEW COMPARISON:  Pelvis 05/24/2019 FINDINGS: Bowel gas pattern is nonobstructive. There is moderate fecal retention throughout the colon. No free peritoneal air. Surgical clips present over the gastroesophageal junction. Fusion hardware intact over the lumbar spine. There is diffuse decreased bone mineralization.  Degenerative changes of the spine and hips are present. Previous L1 kyphoplasty. IMPRESSION: Nonobstructive bowel gas pattern with moderate fecal retention throughout the colon. Electronically Signed   By: Marin Olp M.D.   On: 02/02/2022 12:02        Scheduled Meds:  acetaminophen  1,000 mg Oral Q6H   ALPRAZolam  0.25 mg Oral QHS   aspirin EC  81 mg Oral Daily   budesonide  0.5 mg Nebulization BID   cyanocobalamin  500 mcg Oral Daily   docusate sodium  100 mg Oral BID   feeding supplement  237 mL Oral BID BM   gabapentin  100 mg Oral TID   heparin injection (subcutaneous)  5,000 Units Subcutaneous Q8H   lidocaine  1 patch Transdermal Q24H   lip balm   Topical BID   melatonin  3 mg Oral QHS   nystatin  5 mL Oral QID   pantoprazole  40 mg Oral Daily   polyethylene glycol  17 g Oral BID   senna-docusate  1 tablet Oral BID   Continuous Infusions:  cefTRIAXone (ROCEPHIN)  IV 1 g (02/03/22 0905)   lactated ringers 75 mL/hr at 02/03/22 0416     LOS: 4 days   Time spent= 35 mins    Tiasha Helvie Arsenio Loader, MD [facility]  If 7PM-7AM, please contact night-coverage  02/03/2022, 1:17 PM

## 2022-02-03 NOTE — Progress Notes (Signed)
Pt assisted from bed to chair this am, and a short time later from chair to [facility]. Pt required moderate to max assist from sit to stand and also to pivot to sit. Pt requested pain medication for severe back pain which was administered. Will continue to monitor. Jessie Foot, RN

## 2022-02-03 NOTE — Progress Notes (Signed)
Physical Therapy Treatment Patient Details Name: Katherine [name] MRN: [id number] DOB: 01/18/1929 Today's Date: 02/03/2022   History of Present Illness 88 y.o. female presents to [facility] hospital on 01/29/2022 after a fall. Ptalso with recent fall 7/21, experiencing a sternal fx. Pt also found to have R rib 6-7 fxs and new T7 vertebral compression fx (conservative management). Pt also with adrenal mass and RLL lung nodule. PMH includes COPD, aortic stenosis s/p TAVR, HLD.    PT Comments    Pt making slow, steady progress with mobility. Pt not at level that husband can manage at home. Updated recommendation to SNF.    Recommendations for follow up therapy are one component of a multi-disciplinary discharge planning process, led by the attending physician.  Recommendations may be updated based on patient status, additional functional criteria and insurance authorization.  Follow Up Recommendations  Skilled nursing-short term rehab (<3 hours/day) Can patient physically be transported by private vehicle: No   Assistance Recommended at Discharge Frequent or constant Supervision/Assistance  Patient can return home with the following A lot of help with walking and/or transfers;A lot of help with bathing/dressing/bathroom;Assistance with cooking/housework;Direct supervision/assist for medications management;Direct supervision/assist for financial management;Assist for transportation;Help with stairs or ramp for entrance   Equipment Recommendations  Wheelchair (measurements PT);Wheelchair cushion (measurements PT)    Recommendations for Other Services       Precautions / Restrictions Precautions Precautions: Fall Precaution Comments: tends to lean posteriorly Restrictions Weight Bearing Restrictions: No     Mobility  Bed Mobility Overal bed mobility: Needs Assistance Bed Mobility: Supine to Sit     Supine to sit: Min assist     General bed mobility comments: Assist to elevate trunk into  sitting and bring hips to EOB    Transfers Overall transfer level: Needs assistance Equipment used: Rollator (4 wheels) Transfers: Sit to/from Stand, Bed to chair/wheelchair/BSC Sit to Stand: Mod assist   Step pivot transfers: Mod assist       General transfer comment: Assist for balance due to posterior lean with standing and with stepping bed to bsc to bed. Pt needs verbal/tactile cuing for sequencing turning especially    Ambulation/Gait Ambulation/Gait assistance: Mod assist Gait Distance (Feet): 10 Feet (x 2) Assistive device: Rollator (4 wheels) Gait Pattern/deviations: Step-to pattern, Decreased step length - right, Decreased step length - left, Leaning posteriorly, Trunk flexed, Narrow base of support Gait velocity: decr Gait velocity interpretation: <1.31 ft/sec, indicative of household ambulator   General Gait Details: Assist for balance and to correct for posterior lean   Stairs             Wheelchair Mobility    Modified Rankin (Stroke Patients Only)       Balance Overall balance assessment: Needs assistance Sitting-balance support: No upper extremity supported, Feet supported Sitting balance-Leahy Scale: Fair     Standing balance support: Bilateral upper extremity supported, Reliant on assistive device for balance Standing balance-Leahy Scale: Poor Standing balance comment: walker and min to mod assist due to posterior lean                            Cognition Arousal/Alertness: Awake/alert Behavior During Therapy: WFL for tasks assessed/performed Overall Cognitive Status: Impaired/Different from baseline Area of Impairment: Memory, Orientation, Safety/judgement, Awareness                 Orientation Level: Disoriented to, Place, Situation (pt thinks she is at home)   Memory: Decreased  recall of precautions, Decreased short-term memory   Safety/Judgement: Decreased awareness of safety, Decreased awareness of  deficits Awareness: Emergent            Exercises      General Comments General comments (skin integrity, edema, etc.): SpO2 decr to 88% with activity on RA. Replaced O2. HR to 130's with activity      Pertinent Vitals/Pain Pain Assessment Pain Assessment: Faces Faces Pain Scale: Hurts little more Pain Location: sternum Pain Descriptors / Indicators: Aching, Grimacing Pain Intervention(s): Limited activity within patient's tolerance, Monitored during session    Home Living                          Prior Function            PT Goals (current goals can now be found in the care plan section) Acute Rehab PT Goals Patient Stated Goal: to stop leaning backward and falling Progress towards PT goals: Progressing toward goals    Frequency    Min 3X/week      PT Plan Discharge plan needs to be updated;Frequency needs to be updated    Co-evaluation              AM-PAC PT "6 Clicks" Mobility   Outcome Measure  Help needed turning from your back to your side while in a flat bed without using bedrails?: A Little Help needed moving from lying on your back to sitting on the side of a flat bed without using bedrails?: A Little Help needed moving to and from a bed to a chair (including a wheelchair)?: A Lot Help needed standing up from a chair using your arms (e.g., wheelchair or bedside chair)?: A Lot Help needed to walk in hospital room?: Total Help needed climbing 3-5 steps with a railing? : Total 6 Click Score: 12    End of Session Equipment Utilized During Treatment: Oxygen;Gait belt Activity Tolerance: Patient tolerated treatment well Patient left: in chair;with call bell/phone within reach;with chair alarm set Nurse Communication: Mobility status PT Visit Diagnosis: Repeated falls (R29.6);Other abnormalities of gait and mobility (R26.89)     Time: 1720-1747 PT Time Calculation (min) (ACUTE ONLY): 27 min  Charges:  $Gait Training: 23-37  mins                     [place] Office [place] 02/03/2022, 6:03 PM

## 2022-02-03 NOTE — Care Management Important Message (Signed)
Important Message  Patient Details  Name: NICO ROGNESS MRN: [id number] Date of Birth: Jan 10, 1929   Medicare Important Message Given:  Yes     Shelda Altes 02/03/2022, 10:22 AM

## 2022-02-04 ENCOUNTER — Encounter: Payer: Self-pay | Admitting: Physician Assistant

## 2022-02-04 DIAGNOSIS — S0101XA Laceration without foreign body of scalp, initial encounter: Secondary | ICD-10-CM | POA: Diagnosis not present

## 2022-02-04 DIAGNOSIS — F32A Depression, unspecified: Secondary | ICD-10-CM | POA: Diagnosis not present

## 2022-02-04 DIAGNOSIS — R9431 Abnormal electrocardiogram [ECG] [EKG]: Secondary | ICD-10-CM

## 2022-02-04 DIAGNOSIS — S59911A Unspecified injury of right forearm, initial encounter: Secondary | ICD-10-CM | POA: Diagnosis present

## 2022-02-04 DIAGNOSIS — S22080D Wedge compression fracture of T11-T12 vertebra, subsequent encounter for fracture with routine healing: Secondary | ICD-10-CM | POA: Diagnosis not present

## 2022-02-04 DIAGNOSIS — S2222XD Fracture of body of sternum, subsequent encounter for fracture with routine healing: Secondary | ICD-10-CM | POA: Diagnosis not present

## 2022-02-04 DIAGNOSIS — R262 Difficulty in walking, not elsewhere classified: Secondary | ICD-10-CM | POA: Diagnosis not present

## 2022-02-04 DIAGNOSIS — Z23 Encounter for immunization: Secondary | ICD-10-CM | POA: Diagnosis not present

## 2022-02-04 DIAGNOSIS — Z7401 Bed confinement status: Secondary | ICD-10-CM | POA: Diagnosis not present

## 2022-02-04 DIAGNOSIS — E119 Type 2 diabetes mellitus without complications: Secondary | ICD-10-CM | POA: Diagnosis not present

## 2022-02-04 DIAGNOSIS — S2220XD Unspecified fracture of sternum, subsequent encounter for fracture with routine healing: Secondary | ICD-10-CM | POA: Diagnosis not present

## 2022-02-04 DIAGNOSIS — S52501A Unspecified fracture of the lower end of right radius, initial encounter for closed fracture: Secondary | ICD-10-CM | POA: Diagnosis not present

## 2022-02-04 DIAGNOSIS — I10 Essential (primary) hypertension: Secondary | ICD-10-CM | POA: Diagnosis not present

## 2022-02-04 DIAGNOSIS — F411 Generalized anxiety disorder: Secondary | ICD-10-CM | POA: Diagnosis not present

## 2022-02-04 DIAGNOSIS — Z9181 History of falling: Secondary | ICD-10-CM | POA: Diagnosis not present

## 2022-02-04 DIAGNOSIS — S22060D Wedge compression fracture of T7-T8 vertebra, subsequent encounter for fracture with routine healing: Secondary | ICD-10-CM | POA: Diagnosis not present

## 2022-02-04 DIAGNOSIS — R1310 Dysphagia, unspecified: Secondary | ICD-10-CM | POA: Diagnosis not present

## 2022-02-04 DIAGNOSIS — N390 Urinary tract infection, site not specified: Secondary | ICD-10-CM | POA: Diagnosis not present

## 2022-02-04 DIAGNOSIS — R52 Pain, unspecified: Secondary | ICD-10-CM | POA: Diagnosis not present

## 2022-02-04 DIAGNOSIS — S0003XA Contusion of scalp, initial encounter: Secondary | ICD-10-CM | POA: Diagnosis not present

## 2022-02-04 DIAGNOSIS — W19XXXA Unspecified fall, initial encounter: Secondary | ICD-10-CM | POA: Diagnosis not present

## 2022-02-04 DIAGNOSIS — G8929 Other chronic pain: Secondary | ICD-10-CM | POA: Diagnosis not present

## 2022-02-04 DIAGNOSIS — R296 Repeated falls: Secondary | ICD-10-CM | POA: Diagnosis not present

## 2022-02-04 DIAGNOSIS — M79603 Pain in arm, unspecified: Secondary | ICD-10-CM | POA: Diagnosis not present

## 2022-02-04 DIAGNOSIS — R531 Weakness: Secondary | ICD-10-CM | POA: Diagnosis not present

## 2022-02-04 DIAGNOSIS — S2221XD Fracture of manubrium, subsequent encounter for fracture with routine healing: Secondary | ICD-10-CM | POA: Diagnosis not present

## 2022-02-04 DIAGNOSIS — S2221XK Fracture of manubrium, subsequent encounter for fracture with nonunion: Secondary | ICD-10-CM | POA: Diagnosis not present

## 2022-02-04 DIAGNOSIS — E278 Other specified disorders of adrenal gland: Secondary | ICD-10-CM | POA: Diagnosis not present

## 2022-02-04 DIAGNOSIS — Z7189 Other specified counseling: Secondary | ICD-10-CM | POA: Diagnosis not present

## 2022-02-04 DIAGNOSIS — F0390 Unspecified dementia without behavioral disturbance: Secondary | ICD-10-CM | POA: Diagnosis not present

## 2022-02-04 DIAGNOSIS — R410 Disorientation, unspecified: Secondary | ICD-10-CM | POA: Diagnosis not present

## 2022-02-04 DIAGNOSIS — M4312 Spondylolisthesis, cervical region: Secondary | ICD-10-CM | POA: Diagnosis not present

## 2022-02-04 DIAGNOSIS — N3000 Acute cystitis without hematuria: Secondary | ICD-10-CM | POA: Diagnosis not present

## 2022-02-04 DIAGNOSIS — J9610 Chronic respiratory failure, unspecified whether with hypoxia or hypercapnia: Secondary | ICD-10-CM | POA: Diagnosis not present

## 2022-02-04 DIAGNOSIS — S52601D Unspecified fracture of lower end of right ulna, subsequent encounter for closed fracture with routine healing: Secondary | ICD-10-CM | POA: Diagnosis not present

## 2022-02-04 DIAGNOSIS — M6281 Muscle weakness (generalized): Secondary | ICD-10-CM | POA: Diagnosis not present

## 2022-02-04 DIAGNOSIS — S52601A Unspecified fracture of lower end of right ulna, initial encounter for closed fracture: Secondary | ICD-10-CM | POA: Diagnosis not present

## 2022-02-04 DIAGNOSIS — R41841 Cognitive communication deficit: Secondary | ICD-10-CM | POA: Diagnosis not present

## 2022-02-04 DIAGNOSIS — I959 Hypotension, unspecified: Secondary | ICD-10-CM | POA: Diagnosis not present

## 2022-02-04 DIAGNOSIS — Y92009 Unspecified place in unspecified non-institutional (private) residence as the place of occurrence of the external cause: Secondary | ICD-10-CM | POA: Diagnosis not present

## 2022-02-04 DIAGNOSIS — M1990 Unspecified osteoarthritis, unspecified site: Secondary | ICD-10-CM | POA: Diagnosis not present

## 2022-02-04 DIAGNOSIS — Z7982 Long term (current) use of aspirin: Secondary | ICD-10-CM | POA: Diagnosis not present

## 2022-02-04 DIAGNOSIS — S2241XA Multiple fractures of ribs, right side, initial encounter for closed fracture: Secondary | ICD-10-CM | POA: Diagnosis not present

## 2022-02-04 DIAGNOSIS — R2689 Other abnormalities of gait and mobility: Secondary | ICD-10-CM | POA: Diagnosis not present

## 2022-02-04 DIAGNOSIS — Z9189 Other specified personal risk factors, not elsewhere classified: Secondary | ICD-10-CM | POA: Diagnosis not present

## 2022-02-04 DIAGNOSIS — J449 Chronic obstructive pulmonary disease, unspecified: Secondary | ICD-10-CM | POA: Diagnosis not present

## 2022-02-04 DIAGNOSIS — E559 Vitamin D deficiency, unspecified: Secondary | ICD-10-CM | POA: Diagnosis not present

## 2022-02-04 DIAGNOSIS — I6782 Cerebral ischemia: Secondary | ICD-10-CM | POA: Diagnosis not present

## 2022-02-04 DIAGNOSIS — M79631 Pain in right forearm: Secondary | ICD-10-CM | POA: Diagnosis not present

## 2022-02-04 DIAGNOSIS — M7989 Other specified soft tissue disorders: Secondary | ICD-10-CM | POA: Diagnosis not present

## 2022-02-04 DIAGNOSIS — M25531 Pain in right wrist: Secondary | ICD-10-CM | POA: Diagnosis not present

## 2022-02-04 DIAGNOSIS — S22000D Wedge compression fracture of unspecified thoracic vertebra, subsequent encounter for fracture with routine healing: Secondary | ICD-10-CM | POA: Diagnosis not present

## 2022-02-04 DIAGNOSIS — R2681 Unsteadiness on feet: Secondary | ICD-10-CM | POA: Diagnosis not present

## 2022-02-04 DIAGNOSIS — S52501D Unspecified fracture of the lower end of right radius, subsequent encounter for closed fracture with routine healing: Secondary | ICD-10-CM | POA: Diagnosis not present

## 2022-02-04 DIAGNOSIS — S2222XA Fracture of body of sternum, initial encounter for closed fracture: Secondary | ICD-10-CM | POA: Diagnosis not present

## 2022-02-04 DIAGNOSIS — S22070D Wedge compression fracture of T9-T10 vertebra, subsequent encounter for fracture with routine healing: Secondary | ICD-10-CM | POA: Diagnosis not present

## 2022-02-04 DIAGNOSIS — S2249XD Multiple fractures of ribs, unspecified side, subsequent encounter for fracture with routine healing: Secondary | ICD-10-CM | POA: Diagnosis not present

## 2022-02-04 DIAGNOSIS — G4489 Other headache syndrome: Secondary | ICD-10-CM | POA: Diagnosis not present

## 2022-02-04 MED ORDER — OXYCODONE HCL 5 MG PO TABS: 5.0000 mg | ORAL_TABLET | Freq: Four times a day (QID) | ORAL | 0 refills | Status: DC | PRN

## 2022-02-04 MED ORDER — DOCUSATE SODIUM 100 MG PO CAPS: 100.0000 mg | ORAL_CAPSULE | Freq: Two times a day (BID) | ORAL | 0 refills | Status: DC

## 2022-02-04 MED ORDER — METHOCARBAMOL 1000 MG PO TABS: 500.0000 mg | ORAL_TABLET | Freq: Four times a day (QID) | ORAL | 0 refills | Status: DC | PRN

## 2022-02-04 MED ORDER — BISACODYL 10 MG RE SUPP: 10.0000 mg | Freq: Two times a day (BID) | RECTAL | 0 refills | Status: DC | PRN

## 2022-02-04 MED ORDER — GABAPENTIN 100 MG PO CAPS: 100.0000 mg | ORAL_CAPSULE | Freq: Three times a day (TID) | ORAL | Status: DC

## 2022-02-04 MED ORDER — TRAMADOL HCL 50 MG PO TABS
50.0000 mg | ORAL_TABLET | Freq: Three times a day (TID) | ORAL | 0 refills | Status: DC | PRN
Start: 2022-02-04 — End: 2022-02-12

## 2022-02-04 MED ORDER — ALPRAZOLAM 0.25 MG PO TABS: 0.2500 mg | ORAL_TABLET | Freq: Every evening | ORAL | 0 refills | Status: DC | PRN

## 2022-02-04 MED ORDER — POLYETHYLENE GLYCOL 3350 17 G PO PACK: 17.0000 g | PACK | Freq: Every day | ORAL | 0 refills | Status: DC

## 2022-02-04 NOTE — TOC Progression Note (Addendum)
Transition of Care [facility]) - Progression Note    Patient Details  Name: Katherine [name] MRN: [id number] Date of Birth: [birth year]  Transition of Care [facility]) CM/SW [place], [place] Phone Number: 02/04/2022, 10:44 AM  Clinical Narrative:     Patients insurance authorization has been approved. Plan Auth ID# [id number] [place] ID# [id number]. Insurance authorization has been approved from 8/22-8/24. Patient has SNF bed at [facility] when medically ready for dc. CSW informed MD.CSW will continue to follow and assist with patients dc planning needs.  Expected Discharge Plan: Skilled Nursing Facility Barriers to Discharge: Ship broker, Continued Medical Work up, SNF Pending bed offer  Expected Discharge Plan and Services Expected Discharge Plan: [place] arrangements for the past 2 months: Single Family Home                                       Social Determinants of Health (SDOH) Interventions    Readmission Risk Interventions    08/19/2021    3:21 PM  Readmission Risk Prevention Plan  Post Dischage Appt Complete  Medication Screening Complete  Transportation Screening Complete

## 2022-02-04 NOTE — TOC Transition Note (Signed)
Transition of Care [facility]) - CM/SW Discharge Note   Patient Details  Name: Katherine [name] MRN: [id number] Date of Birth: 12-Jun-1929  Transition of Care [facility]) CM/SW Contact:  Milas Gain, [place] Phone Number: 02/04/2022, 11:08 AM   Clinical Narrative:     Patient will DC to: [place] date: 02/04/2022  Family notified: [name]   Transport by: Corey Harold  ?  Per MD patient ready for DC to [facility] . RN, patient, patient's family, and facility notified of DC. Discharge Summary sent to facility. RN given number for report tele# [phone] RM# [id number]. DC packet on chart. DNR signed by MD attached to patients DC packet.Ambulance transport requested for patient.  CSW signing off.   Final next level of care: Skilled Nursing Facility Barriers to Discharge: No Barriers Identified   Patient Goals and CMS Choice Patient states their goals for this hospitalization and ongoing recovery are:: To be able to return home CMS Medicare.gov Compare Post Acute Care list provided to:: Patient Represenative (must comment) (Patients spouse [name]) Choice offered to / list presented to : Spouse  Discharge Placement              Patient chooses bed at: [facility] Patient to be transferred to facility by: [place] Name of family member notified: [name] Patient and family notified of of transfer: 02/04/22  Discharge Plan and Services                                     Social Determinants of Health (SDOH) Interventions     Readmission Risk Interventions    08/19/2021    3:21 PM  Readmission Risk Prevention Plan  Post Dischage Appt Complete  Medication Screening Complete  Transportation Screening Complete

## 2022-02-04 NOTE — Discharge Summary (Signed)
Physician Discharge Summary  Katherine [name] YQI:[id number] DOB: May 25, 1929 DOA: 01/29/2022  PCP: Katherine Showers, MD  Admit date: 01/29/2022 Discharge date: 02/04/2022  Admitted From: Home Disposition:  SNF  Recommendations for Outpatient Follow-up:  Follow up with PCP in 1-2 weeks Please obtain BMP/CBC in one week your next doctors visit.  Pain medications with bowel regimen has been prescribed   Discharge Condition: Stable CODE STATUS: DN number Diet recommendation: Clear  Brief/Interim Summary: 88 year old with past medical history significant for history of severe COPD on 2 L of oxygen, severe aortic stenosis status post TAVR, seen by PCP last month on 7/21st after a fall and x-ray revealed acute sternal fracture.  Seen in the ED 7/26 for ongoing pain and was prescribed oral morphine.  PCP had also referred patient to pain management and prescribed tramadol.  Patient return to the ED 8/16 with ongoing pain after another fall. Patient was evaluated by trauma team who recommended medical management.  She was seen by PT/OT who recommended SNF.  Arrangements were made by TOC.         1-Frequent fall, multiple fractures: Body and manubrium of the sternum fracture, chronic compression fracture T8, T9 and T12 new compression fracture of T7. -Dr Waldron Labs discussed with neurosurgery Dr. Kathyrn Sheriff regarding T7 vertebral compression fracture, no need for surgical intervention, fracture is too high for a brace.  Patient to follow-up outpatient with neurosurgery in a few weeks if she still have severe pain.  Patient will be discharged home with pain medication with bowel regimen.  Advised to continue using incentive spirometer.   UTI: Did course of Rocephin in the hospital Urine culture: Citrobacter, pan-sensitive   Hypokalemia: Replaced.    Adult failure to thrive Oral supplements as necessary   Constipation:  Aggressive bowel regimen has been ordered   COPD, emphysema, chronic  hypoxic respiratory failure on 2 L of oxygen at home Continue with Pulmicort DuoNebs   Stable left adrenal mass: Follow-up as an outpatient   Aspiration event; Patient has been seen by speech.  Mild aspiration risk.     Estimated body mass index is 17.91 kg/m as calculated from the following:   Height as of 01/08/22: '5\' 1"'$  (1.549 m).   Weight as of 01/08/22: 43 kg.        Discharge Diagnoses:  Principal Problem:   Closed fracture of body of sternum Active Problems:   COPD, severe (HCC)   Lung nodule   UTI (urinary tract infection)   Closed fracture of manubrium   Closed post-traumatic compression fracture of T7 thoracic vertebra    Fracture of ribs - right 6th & 7th   Adrenal mass (HCC)   Hypokalemia   QT prolongation      Consultations: Trauma  Subjective: Feels well no complaints. Overnight had an episode of confusion/delirium but better this morning.  Discharge Exam: Vitals:   02/04/22 0552 02/04/22 0824  BP: (!) 150/56   Pulse: 81 89  Resp: 17 16  Temp: 97.9 F (36.6 C)   SpO2: 100%    Vitals:   02/03/22 2018 02/03/22 2053 02/04/22 0552 02/04/22 0824  BP: 137/66  (!) 150/56   Pulse: 100  81 89  Resp: '16  17 16  '$ Temp: 98.3 F (36.8 C)  97.9 F (36.6 C)   TempSrc: Oral  Axillary   SpO2: 99% 99% 100%     General: Pt is alert, awake, not in acute distress Cardiovascular: RRR, S1/S2 +, no rubs, no gallops Respiratory: CTA  bilaterally, no wheezing, no rhonchi Abdominal: Soft, NT, ND, bowel sounds + Extremities: no edema, no cyanosis  Discharge Instructions   Allergies as of 02/04/2022   No Known Allergies      Medication List     STOP taking these medications    HYDROcodone-acetaminophen 10-325 MG tablet Commonly known as: NORCO   ibuprofen 200 MG tablet Commonly known as: ADVIL   loperamide 2 MG capsule Commonly known as: IMODIUM   morphine 15 MG tablet Commonly known as: MSIR       TAKE these medications    albuterol  108 (90 Base) MCG/ACT inhaler Commonly known as: VENTOLIN HFA INHALE 2 PUFFS INTO THE LUNGS 4 (FOUR) TIMES DAILY AS NEEDED FOR WHEEZING OR SHORTNESS OF BREATH. What changed: See the new instructions.   ALPRAZolam 0.25 MG tablet Commonly known as: XANAX Take 1 tablet (0.25 mg total) by mouth at bedtime as needed for anxiety or sleep. What changed: See the new instructions.   aspirin EC 81 MG tablet Take 81 mg by mouth daily. Swallow whole. What changed: Another medication with the same name was removed. Continue taking this medication, and follow the directions you see here.   bisacodyl 10 MG suppository Commonly known as: DULCOLAX Place 1 suppository (10 mg total) rectally every 12 (twelve) hours as needed for moderate constipation or severe constipation.   budesonide 0.5 MG/2ML nebulizer solution Commonly known as: PULMICORT TAKE 2 MLS (0.5 MG TOTAL) BY NEBULIZATION 2 (TWO) TIMES DAILY. What changed: See the new instructions.   diclofenac Sodium 1 % Gel Commonly known as: VOLTAREN Apply 4 g topically 4 (four) times daily.   docusate sodium 100 MG capsule Commonly known as: COLACE Take 1 capsule (100 mg total) by mouth 2 (two) times daily.   escitalopram 5 MG tablet Commonly known as: LEXAPRO TAKE 1 TABLET BY MOUTH EVERY DAY What changed: when to take this   fluticasone 50 MCG/ACT nasal spray Commonly known as: FLONASE Place 1 spray into both nostrils daily.   gabapentin 100 MG capsule Commonly known as: NEURONTIN Take 1 capsule (100 mg total) by mouth 3 (three) times daily.   ipratropium 0.02 % nebulizer solution Commonly known as: ATROVENT TAKE 2.5 MLS (0.5 MG TOTAL) BY NEBULIZATION 4 (FOUR) TIMES DAILY. What changed:  when to take this reasons to take this   iron polysaccharides 150 MG capsule Commonly known as: NIFEREX TAKE 1 CAPSULE BY MOUTH TWICE A DAY What changed: when to take this   Melatonin 10 MG Caps Take 10 mg by mouth at bedtime.    Methocarbamol 1000 MG Tabs Take 500 mg by mouth every 6 (six) hours as needed for muscle spasms.   multivitamin with minerals tablet Take 1 tablet by mouth daily.   oxyCODONE 5 MG immediate release tablet Commonly known as: Oxy IR/ROXICODONE Take 1 tablet (5 mg total) by mouth every 6 (six) hours as needed for severe pain or breakthrough pain.   pantoprazole 40 MG tablet Commonly known as: PROTONIX TAKE 1 TABLET BY MOUTH EVERY DAY   polyethylene glycol 17 g packet Commonly known as: MIRALAX / GLYCOLAX Take 17 g by mouth daily.   promethazine 12.5 MG tablet Commonly known as: PHENERGAN TAKE 1 TABLET (12.5 MG TOTAL) BY MOUTH EVERY 8 (EIGHT) HOURS AS NEEDED FOR NAUSEA OR VOMITING.   SALONPAS PAIN RELIEVING EX Apply 1 Application topically 2 (two) times daily as needed (pain).   traMADol 50 MG tablet Commonly known as: ULTRAM Take 1 tablet (50 mg  total) by mouth 3 (three) times daily as needed for moderate pain (pain). What changed: reasons to take this   traZODone 50 MG tablet Commonly known as: DESYREL TAKE 3 TABLETS BY MOUTH AT BEDTIME What changed: how much to take   VITAMIN B-12 PO Take 1 tablet by mouth daily.        Follow-up Information     Baxley, Cresenciano Lick, MD Follow up in 1 week(s).   Specialty: Internal Medicine Contact information: [address] [address] [phone]         Burnell Blanks, MD .   Specialty: Cardiology Contact information: [address] [place] [address] [phone]                No Known Allergies  You were cared for by a hospitalist during your hospital stay. If you have any questions about your discharge medications or the care you received while you were in the hospital after you are discharged, you can call the unit and asked to speak with the hospitalist on call if the hospitalist that took care of you is not available. Once you are discharged, your primary care  physician will handle any further medical issues. Please note that no refills for any discharge medications will be authorized once you are discharged, as it is imperative that you return to your primary care physician (or establish a relationship with a primary care physician if you do not have one) for your aftercare needs so that they can reassess your need for medications and monitor your lab values.   Procedures/Studies: DG CHEST PORT 1 VIEW  Result Date: 02/02/2022 CLINICAL DATA:  Dyspnea. EXAM: PORTABLE CHEST 1 VIEW COMPARISON:  01/08/2022 and older exams.  CT, 01/30/2022. FINDINGS: Cardiac silhouette is normal in size. Stable prosthetic aortic valve. No mediastinal or hilar masses. Lungs are hyperexpanded. There are prominent interstitial markings and relative lucency in the upper lobes. No lung consolidation. No evidence of edema. No pleural effusion or pneumothorax. Skeletal structures are grossly intact. IMPRESSION: 1. No acute cardiopulmonary disease. 2. COPD/emphysema. Electronically Signed   By: Lajean Manes M.D.   On: 02/02/2022 16:22   DG Abd 1 View  Result Date: 02/02/2022 CLINICAL DATA:  Left lower quadrant pain and constipation. EXAM: ABDOMEN - 1 VIEW COMPARISON:  Pelvis 05/24/2019 FINDINGS: Bowel gas pattern is nonobstructive. There is moderate fecal retention throughout the colon. No free peritoneal air. Surgical clips present over the gastroesophageal junction. Fusion hardware intact over the lumbar spine. There is diffuse decreased bone mineralization. Degenerative changes of the spine and hips are present. Previous L1 kyphoplasty. IMPRESSION: Nonobstructive bowel gas pattern with moderate fecal retention throughout the colon. Electronically Signed   By: Marin Olp M.D.   On: 02/02/2022 12:02   CT Cervical Spine Wo Contrast  Result Date: 01/30/2022 CLINICAL DATA:  History of recent fall. EXAM: CT CERVICAL SPINE WITHOUT CONTRAST TECHNIQUE: Multidetector CT imaging of the  cervical spine was performed without intravenous contrast. Multiplanar CT image reconstructions were also generated. RADIATION DOSE REDUCTION: This exam was performed according to the departmental dose-optimization program which includes automated exposure control, adjustment of the mA and/or kV according to patient size and/or use of iterative reconstruction technique. COMPARISON:  June 23, 2019 FINDINGS: Alignment: There is straightening of the normal cervical spine lordosis. Stable, stable stepwise 2 mm anterolisthesis of the C2 and C3 vertebral bodies is noted. Skull base and vertebrae: No acute fracture. No primary bone lesion or focal  pathologic process. Soft tissues and spinal canal: No prevertebral fluid or swelling. No visible canal hematoma. Disc levels: There is marked severity endplate sclerosis and mild anterior osteophyte formation at the levels of C4-C5 and C5-C6. There is marked severity narrowing of the anterior atlantoaxial articulation. Marked severity intervertebral disc space narrowing is seen at C4-C5 and C5-C6. Mild to moderate severity, predominant posterior intervertebral disc space narrowing is seen at C2-C3 and C3-C4. Bilateral marked severity multilevel facet joint hypertrophy is noted. Upper chest: Moderate severity linear scarring is seen within the posterior aspect of the right upper lobe. Other: None. IMPRESSION: 1. No acute fracture or traumatic malalignment of the cervical spine. 2. Marked severity multilevel degenerative changes, as described above. 3. Stable, stable, stepwise 2 mm anterolisthesis of the C2 and C3 vertebral bodies. Electronically Signed   By: Virgina Norfolk M.D.   On: 01/30/2022 01:28   CT Angio Chest PE W and/or Wo Contrast  Result Date: 01/30/2022 CLINICAL DATA:  Status post fall 3 weeks ago. EXAM: CT ANGIOGRAPHY CHEST WITH CONTRAST TECHNIQUE: Multidetector CT imaging of the chest was performed using the standard protocol during bolus administration of  intravenous contrast. Multiplanar CT image reconstructions and MIPs were obtained to evaluate the vascular anatomy. RADIATION DOSE REDUCTION: This exam was performed according to the departmental dose-optimization program which includes automated exposure control, adjustment of the mA and/or kV according to patient size and/or use of iterative reconstruction technique. CONTRAST:  98m OMNIPAQUE IOHEXOL 350 MG/ML SOLN COMPARISON:  January 29, 2022 and July 29, 2021 FINDINGS: Cardiovascular: There is marked severity calcification of the thoracic aorta, without evidence of aortic aneurysm or dissection. An artificial aortic valve is seen. Satisfactory opacification of the pulmonary arteries to the segmental level. No evidence of pulmonary embolism. Normal heart size with mild coronary artery calcification. No pericardial effusion. Mediastinum/Nodes: No enlarged mediastinal, hilar, or axillary lymph nodes. Small thyroid nodules are seen within both the right and left lobes of the thyroid gland. The trachea and esophagus demonstrate no significant findings. Lungs/Pleura: There is marked severity emphysematous lung disease. Stable mild to moderate severity right upper lobe linear scarring is again noted with mild, stable left upper lobe and posterior left basilar atelectatic changes. A stable 6 mm noncalcified lung nodule is seen within the lateral aspect of the right lower lobe. There is no evidence of acute infiltrate, pleural effusion or pneumothorax. Upper Abdomen: Multiple stable hepatic cysts are seen. Multiple surgical clips are noted within the region posterior to the left lobe of the liver. Musculoskeletal: Acute fractures are seen involving the body and manubrium of the sternum. Ill-defined deformities of indeterminate age are seen along the anterolateral aspects of the sixth and seventh right ribs. Chronic compression fracture deformities are seen involving the T8, T9 and T12 vertebral bodies. A new  compression fracture deformity of the T7 vertebral body is noted. Review of the MIP images confirms the above findings. IMPRESSION: 1. Acute fractures involving the body and manubrium of the sternum. 2. Ill-defined deformities of indeterminate age along the anterolateral aspects of the sixth and seventh right ribs. Correlation with physical examination is recommended to determine the presence of point tenderness. 3. Chronic compression fracture deformities of the T8, T9 and T12 vertebral bodies. 4. New compression fracture deformity of the T7 vertebral body. 5. Stable 6 mm noncalcified lung nodule within the lateral aspect of the right lower lobe. Non-contrast chest CT at 6-12 months is recommended. If the nodule is stable at time of repeat CT, then  future CT at 18-24 months (from today's scan) is considered optional for low-risk patients, but is recommended for high-risk patients. This recommendation follows the consensus statement: Guidelines for Management of Incidental Pulmonary Nodules Detected on CT Images: From the Fleischner Society 2017; Radiology 2017; 284:228-243. 6. Marked severity emphysematous lung disease with stable bilateral upper lobe and posterior left basilar atelectatic changes. Aortic Atherosclerosis (ICD10-I70.0) and Emphysema (ICD10-J43.9). Electronically Signed   By: Virgina Norfolk M.D.   On: 01/30/2022 01:24   CT Head Wo Contrast  Result Date: 01/30/2022 CLINICAL DATA:  Status post fall 3 weeks ago. EXAM: CT HEAD WITHOUT CONTRAST TECHNIQUE: Contiguous axial images were obtained from the base of the skull through the vertex without intravenous contrast. RADIATION DOSE REDUCTION: This exam was performed according to the departmental dose-optimization program which includes automated exposure control, adjustment of the mA and/or kV according to patient size and/or use of iterative reconstruction technique. COMPARISON:  October 10, 2021 FINDINGS: Brain: There is moderate severity cerebral  atrophy with widening of the extra-axial spaces and ventricular dilatation. There are areas of decreased attenuation within the white matter tracts of the supratentorial brain, consistent with microvascular disease changes. Vascular: No hyperdense vessel or unexpected calcification. Skull: Normal. Negative for fracture or focal lesion. Sinuses/Orbits: There is mild left maxillary sinus mucosal thickening. Other: None. IMPRESSION: 1. No acute intracranial abnormality. 2. Generalized cerebral atrophy and microvascular disease changes of the supratentorial brain. 3. Mild left maxillary sinus disease. Electronically Signed   By: Virgina Norfolk M.D.   On: 01/30/2022 01:12   CT CHEST ABDOMEN PELVIS WO CONTRAST  Result Date: 01/29/2022 CLINICAL DATA:  Status post trauma. EXAM: CT CHEST, ABDOMEN AND PELVIS WITHOUT CONTRAST TECHNIQUE: Multidetector CT imaging of the chest, abdomen and pelvis was performed following the standard protocol without IV contrast. RADIATION DOSE REDUCTION: This exam was performed according to the departmental dose-optimization program which includes automated exposure control, adjustment of the mA and/or kV according to patient size and/or use of iterative reconstruction technique. COMPARISON:  July 29, 2021 FINDINGS: CT CHEST FINDINGS Cardiovascular: There is marked severity calcification of the thoracic aorta, without evidence of aortic aneurysm. An artificial aortic valve is seen. Normal heart size with moderate severity coronary artery calcification. A trace amount of pericardial fluid is present. Mediastinum/Nodes: No enlarged mediastinal, hilar, or axillary lymph nodes. Thyroid gland, trachea, and esophagus demonstrate no significant findings. Lungs/Pleura: There is marked severity emphysematous lung disease. Stable mild to moderate severity posterior right upper lobe linear scarring is seen. Mild left upper lobe and posterior left basilar atelectasis is seen. A stable 6 mm  noncalcified lung nodule is noted within the lateral aspect of the right lower lobe (axial CT image 82, CT series 4). Musculoskeletal: Fracture deformities are seen involving the body and manubrium of the sternum. Chronic compression fracture deformities are seen at the levels of T8, T9 and T12. An additional compression fracture deformity of the T7 vertebral body is seen. This represents a new finding when compared to the prior exam. CT ABDOMEN PELVIS FINDINGS Hepatobiliary: Multiple hepatic cysts are seen, the largest of which measures 1.5 cm and is noted within the anterior aspect of the left lobe of the liver. Multiple surgical clips are seen posterior to the left lobe of the liver. No gallstones, gallbladder wall thickening, or biliary dilatation. Pancreas: Unremarkable. No pancreatic ductal dilatation or surrounding inflammatory changes. Spleen: Normal in size without focal abnormality. Adrenals/Urinary Tract: The right adrenal gland is unremarkable. A 1.4 cm x 0.8 cm  low-attenuation left adrenal mass is seen (approximately 36.71 Hounsfield units). Kidneys are normal, without renal calculi, focal lesion, or hydronephrosis. Bladder is unremarkable. Stomach/Bowel: Stomach is within normal limits. The appendix is not identified. A large amount of stool is seen within the ascending colon. No evidence of bowel wall thickening, distention, or inflammatory changes. Noninflamed diverticula are seen throughout the sigmoid colon. Vascular/Lymphatic: Aortic atherosclerosis. No enlarged abdominal or pelvic lymph nodes. Reproductive: Status post hysterectomy. No adnexal masses. Other: No abdominal wall hernia or abnormality. No abdominopelvic ascites. Musculoskeletal: A chronic pressure in fracture deformity and subsequent vertebroplasty is seen at the level of L1. Extensive postoperative changes are seen within the mid and lower lumbar spine. IMPRESSION: 1. Fracture deformities involving the body and manubrium of the  sternum. 2. Chronic compression fracture deformities at the levels of T8, T9 and T12 with a new compression fracture deformity of the T7 vertebral body. 3. Stable 6 mm right lower lobe noncalcified lung nodule. 4. Marked severity emphysematous lung disease with stable mild to moderate severity posterior right upper lobe linear scarring. 5. Multiple hepatic cysts. 6. Stable low-attenuation left adrenal mass which likely represents a benign adenoma. Correlation with 1 year follow-up adrenal washout CT is recommended. This recommendation follows ACR consensus guidelines: Management of Incidental Adrenal Masses: A White Paper of the ACR Incidental Findings Committee. J Am Coll Radiol 2017;14:1038-1044. 7. Sigmoid diverticulosis. 8. Extensive postoperative changes within the mid and lower lumbar spine. Aortic Atherosclerosis (ICD10-I70.0) and Emphysema (ICD10-J43.9). Electronically Signed   By: Virgina Norfolk M.D.   On: 01/29/2022 23:20   DG Chest 2 View  Result Date: 01/08/2022 CLINICAL DATA:  Chest pain, fatigue EXAM: CHEST - 2 VIEW COMPARISON:  01/03/2022 FINDINGS: Heart is normal size. Aortic atherosclerosis. Emphysema. No confluent opacities or effusions. No acute bony abnormality. Prior aortic valve repair. IMPRESSION: Emphysema. No acute cardiopulmonary disease. Electronically Signed   By: Rolm Baptise M.D.   On: 01/08/2022 15:46     The results of significant diagnostics from this hospitalization (including imaging, microbiology, ancillary and laboratory) are listed below for reference.     Microbiology: Recent Results (from the past 240 hour(s))  Urine Culture     Status: Abnormal   Collection Time: 01/30/22 10:49 AM   Specimen: Urine, Clean Catch  Result Value Ref Range Status   Specimen Description   Final    URINE, CLEAN CATCH Performed at [facility], [place] [address] [place], [place] [address]    Special Requests   Final    NONE Performed at [name], [place] [address], [place], [place] [address]    Culture >=100,000 COLONIES/mL CITROBACTER AMALONATICUS (A)  Final   Report Status 02/01/2022 FINAL  Final   Organism ID, Bacteria CITROBACTER AMALONATICUS (A)  Final      Susceptibility   Citrobacter amalonaticus - MIC*    CEFAZOLIN <=4 SENSITIVE Sensitive     CEFEPIME <=0.12 SENSITIVE Sensitive     CEFTRIAXONE <=0.25 SENSITIVE Sensitive     CIPROFLOXACIN <=0.25 SENSITIVE Sensitive     GENTAMICIN <=1 SENSITIVE Sensitive     IMIPENEM <=0.25 SENSITIVE Sensitive     NITROFURANTOIN 32 SENSITIVE Sensitive     TRIMETH/SULFA <=20 SENSITIVE Sensitive     PIP/TAZO <=4 SENSITIVE Sensitive     * >=100,000 COLONIES/mL CITROBACTER AMALONATICUS     Labs: BNP (last 3 results) Recent Labs    08/09/21 1112  BNP 270.6*   Basic Metabolic Panel: Recent Labs  Lab 01/29/22 2306  01/30/22 1610 01/31/22 2244 02/01/22 0237 02/02/22 0801  NA 140 136 137  --  136  K 2.9* 4.5 4.1  --  3.9  CL 100 108 102  --  105  CO2 '31 24 28  '$ --  22  GLUCOSE 128* 258* 80  --  80  BUN '17 14 19  '$ --  19  CREATININE 1.11* 0.97 1.30*  --  1.10*  CALCIUM 9.1 7.3* 8.9  --  8.6*  MG 1.7  --   --  2.1  --   PHOS 2.4*  --   --  4.1  --    Liver Function Tests: No results for input(s): "AST", "ALT", "ALKPHOS", "BILITOT", "PROT", "ALBUMIN" in the last 168 hours. No results for input(s): "LIPASE", "AMYLASE" in the last 168 hours. No results for input(s): "AMMONIA" in the last 168 hours. CBC: Recent Labs  Lab 01/29/22 2306 01/30/22 1610 02/01/22 0237 02/02/22 0801  WBC 10.3 9.6 7.0 6.9  NEUTROABS 8.6*  --  4.2  --   HGB 10.9* 10.3* 10.3* 10.0*  HCT 34.4* 33.2* 32.1* 32.4*  MCV 92.2 94.3 92.8 94.2  PLT 169 186 180 187   Cardiac Enzymes: No results for input(s): "CKTOTAL", "CKMB", "CKMBINDEX", "TROPONINI" in the last 168 hours. BNP: Invalid input(s): "POCBNP" CBG: No results for input(s): "GLUCAP" in the last 168 hours. D-Dimer No results for  input(s): "DDIMER" in the last 72 hours. Hgb A1c No results for input(s): "HGBA1C" in the last 72 hours. Lipid Profile No results for input(s): "CHOL", "HDL", "LDLCALC", "TRIG", "CHOLHDL", "LDLDIRECT" in the last 72 hours. Thyroid function studies No results for input(s): "TSH", "T4TOTAL", "T3FREE", "THYROIDAB" in the last 72 hours.  Invalid input(s): "FREET3" Anemia work up No results for input(s): "VITAMINB12", "FOLATE", "FERRITIN", "TIBC", "IRON", "RETICCTPCT" in the last 72 hours. Urinalysis    Component Value Date/Time   COLORURINE YELLOW 01/30/2022 0258   APPEARANCEUR HAZY (A) 01/30/2022 0258   LABSPEC 1.021 01/30/2022 0258   PHURINE 6.0 01/30/2022 0258   GLUCOSEU NEGATIVE 01/30/2022 0258   HGBUR LARGE (A) 01/30/2022 0258   BILIRUBINUR NEGATIVE 01/30/2022 0258   BILIRUBINUR neg 12/03/2021 1536   KETONESUR NEGATIVE 01/30/2022 0258   PROTEINUR 30 (A) 01/30/2022 0258   UROBILINOGEN 0.2 12/03/2021 1536   NITRITE NEGATIVE 01/30/2022 0258   LEUKOCYTESUR TRACE (A) 01/30/2022 0258   Sepsis Labs Recent Labs  Lab 01/29/22 2306 01/30/22 1610 02/01/22 0237 02/02/22 0801  WBC 10.3 9.6 7.0 6.9   Microbiology Recent Results (from the past 240 hour(s))  Urine Culture     Status: Abnormal   Collection Time: 01/30/22 10:49 AM   Specimen: Urine, Clean Catch  Result Value Ref Range Status   Specimen Description   Final    URINE, CLEAN CATCH Performed at [facility], [place] [address] [place], [place] [address]    Special Requests   Final    NONE Performed at [facility], [place] [address], [place], [place] [address]    Culture >=100,000 COLONIES/mL CITROBACTER AMALONATICUS (A)  Final   Report Status 02/01/2022 FINAL  Final   Organism ID, Bacteria CITROBACTER AMALONATICUS (A)  Final      Susceptibility   Citrobacter amalonaticus - MIC*    CEFAZOLIN <=4 SENSITIVE Sensitive     CEFEPIME <=0.12 SENSITIVE Sensitive     CEFTRIAXONE <=0.25  SENSITIVE Sensitive     CIPROFLOXACIN <=0.25 SENSITIVE Sensitive     GENTAMICIN <=1 SENSITIVE Sensitive     IMIPENEM <=0.25 SENSITIVE Sensitive  NITROFURANTOIN 32 SENSITIVE Sensitive     TRIMETH/SULFA <=20 SENSITIVE Sensitive     PIP/TAZO <=4 SENSITIVE Sensitive     * >=100,000 COLONIES/mL CITROBACTER AMALONATICUS     Time coordinating discharge:  I have spent 35 minutes face to face with the patient and on the ward discussing the patients care, assessment, plan and disposition with other care givers. >50% of the time was devoted counseling the patient about the risks and benefits of treatment/Discharge disposition and coordinating care.   SIGNED:   Damita Lack, MD  [facility] 02/04/2022, 10:54 AM   If 7PM-7AM, please contact night-coverage

## 2022-02-06 DIAGNOSIS — R52 Pain, unspecified: Secondary | ICD-10-CM | POA: Diagnosis not present

## 2022-02-06 DIAGNOSIS — Z9181 History of falling: Secondary | ICD-10-CM | POA: Diagnosis not present

## 2022-02-06 DIAGNOSIS — R296 Repeated falls: Secondary | ICD-10-CM | POA: Diagnosis not present

## 2022-02-06 DIAGNOSIS — S2222XA Fracture of body of sternum, initial encounter for closed fracture: Secondary | ICD-10-CM | POA: Diagnosis not present

## 2022-02-06 DIAGNOSIS — R2681 Unsteadiness on feet: Secondary | ICD-10-CM | POA: Diagnosis not present

## 2022-02-06 DIAGNOSIS — S22000D Wedge compression fracture of unspecified thoracic vertebra, subsequent encounter for fracture with routine healing: Secondary | ICD-10-CM | POA: Diagnosis not present

## 2022-02-06 DIAGNOSIS — M6281 Muscle weakness (generalized): Secondary | ICD-10-CM | POA: Diagnosis not present

## 2022-02-06 DIAGNOSIS — S22060D Wedge compression fracture of T7-T8 vertebra, subsequent encounter for fracture with routine healing: Secondary | ICD-10-CM | POA: Diagnosis not present

## 2022-02-06 DIAGNOSIS — S22070D Wedge compression fracture of T9-T10 vertebra, subsequent encounter for fracture with routine healing: Secondary | ICD-10-CM | POA: Diagnosis not present

## 2022-02-06 DIAGNOSIS — S22080D Wedge compression fracture of T11-T12 vertebra, subsequent encounter for fracture with routine healing: Secondary | ICD-10-CM | POA: Diagnosis not present

## 2022-02-06 DIAGNOSIS — N3000 Acute cystitis without hematuria: Secondary | ICD-10-CM | POA: Diagnosis not present

## 2022-02-06 DIAGNOSIS — J449 Chronic obstructive pulmonary disease, unspecified: Secondary | ICD-10-CM | POA: Diagnosis not present

## 2022-02-06 DIAGNOSIS — F411 Generalized anxiety disorder: Secondary | ICD-10-CM | POA: Diagnosis not present

## 2022-02-06 DIAGNOSIS — S2221XD Fracture of manubrium, subsequent encounter for fracture with routine healing: Secondary | ICD-10-CM | POA: Diagnosis not present

## 2022-02-06 DIAGNOSIS — S2222XD Fracture of body of sternum, subsequent encounter for fracture with routine healing: Secondary | ICD-10-CM | POA: Diagnosis not present

## 2022-02-06 DIAGNOSIS — J9610 Chronic respiratory failure, unspecified whether with hypoxia or hypercapnia: Secondary | ICD-10-CM | POA: Diagnosis not present

## 2022-02-07 DIAGNOSIS — R52 Pain, unspecified: Secondary | ICD-10-CM | POA: Diagnosis not present

## 2022-02-07 DIAGNOSIS — S22000D Wedge compression fracture of unspecified thoracic vertebra, subsequent encounter for fracture with routine healing: Secondary | ICD-10-CM | POA: Diagnosis not present

## 2022-02-07 DIAGNOSIS — M6281 Muscle weakness (generalized): Secondary | ICD-10-CM | POA: Diagnosis not present

## 2022-02-07 DIAGNOSIS — S2222XA Fracture of body of sternum, initial encounter for closed fracture: Secondary | ICD-10-CM | POA: Diagnosis not present

## 2022-02-07 DIAGNOSIS — S2221XD Fracture of manubrium, subsequent encounter for fracture with routine healing: Secondary | ICD-10-CM | POA: Diagnosis not present

## 2022-02-07 DIAGNOSIS — S2222XD Fracture of body of sternum, subsequent encounter for fracture with routine healing: Secondary | ICD-10-CM | POA: Diagnosis not present

## 2022-02-07 DIAGNOSIS — Z9181 History of falling: Secondary | ICD-10-CM | POA: Diagnosis not present

## 2022-02-07 DIAGNOSIS — F411 Generalized anxiety disorder: Secondary | ICD-10-CM | POA: Diagnosis not present

## 2022-02-07 DIAGNOSIS — R2681 Unsteadiness on feet: Secondary | ICD-10-CM | POA: Diagnosis not present

## 2022-02-10 DIAGNOSIS — F411 Generalized anxiety disorder: Secondary | ICD-10-CM | POA: Diagnosis not present

## 2022-02-10 DIAGNOSIS — S2222XA Fracture of body of sternum, initial encounter for closed fracture: Secondary | ICD-10-CM | POA: Diagnosis not present

## 2022-02-10 DIAGNOSIS — Z9181 History of falling: Secondary | ICD-10-CM | POA: Diagnosis not present

## 2022-02-10 DIAGNOSIS — S22000D Wedge compression fracture of unspecified thoracic vertebra, subsequent encounter for fracture with routine healing: Secondary | ICD-10-CM | POA: Diagnosis not present

## 2022-02-10 DIAGNOSIS — S2222XD Fracture of body of sternum, subsequent encounter for fracture with routine healing: Secondary | ICD-10-CM | POA: Diagnosis not present

## 2022-02-10 DIAGNOSIS — R52 Pain, unspecified: Secondary | ICD-10-CM | POA: Diagnosis not present

## 2022-02-10 DIAGNOSIS — S2221XD Fracture of manubrium, subsequent encounter for fracture with routine healing: Secondary | ICD-10-CM | POA: Diagnosis not present

## 2022-02-10 DIAGNOSIS — R2681 Unsteadiness on feet: Secondary | ICD-10-CM | POA: Diagnosis not present

## 2022-02-10 DIAGNOSIS — M6281 Muscle weakness (generalized): Secondary | ICD-10-CM | POA: Diagnosis not present

## 2022-02-12 ENCOUNTER — Emergency Department (HOSPITAL_COMMUNITY): Payer: Medicare PPO

## 2022-02-12 ENCOUNTER — Encounter (HOSPITAL_COMMUNITY): Payer: Self-pay

## 2022-02-12 ENCOUNTER — Emergency Department (HOSPITAL_COMMUNITY)
Admission: EM | Admit: 2022-02-12 | Discharge: 2022-02-12 | Disposition: A | Discharge status: Rehabilitation Facility | Payer: Medicare PPO | Attending: Emergency Medicine | Admitting: Emergency Medicine

## 2022-02-12 DIAGNOSIS — Z23 Encounter for immunization: Secondary | ICD-10-CM | POA: Insufficient documentation

## 2022-02-12 DIAGNOSIS — F32A Depression, unspecified: Secondary | ICD-10-CM | POA: Diagnosis not present

## 2022-02-12 DIAGNOSIS — I6782 Cerebral ischemia: Secondary | ICD-10-CM | POA: Insufficient documentation

## 2022-02-12 DIAGNOSIS — S22060D Wedge compression fracture of T7-T8 vertebra, subsequent encounter for fracture with routine healing: Secondary | ICD-10-CM | POA: Diagnosis not present

## 2022-02-12 DIAGNOSIS — S52601A Unspecified fracture of lower end of right ulna, initial encounter for closed fracture: Secondary | ICD-10-CM | POA: Diagnosis not present

## 2022-02-12 DIAGNOSIS — S0101XA Laceration without foreign body of scalp, initial encounter: Secondary | ICD-10-CM | POA: Insufficient documentation

## 2022-02-12 DIAGNOSIS — W19XXXA Unspecified fall, initial encounter: Secondary | ICD-10-CM | POA: Insufficient documentation

## 2022-02-12 DIAGNOSIS — S2221XD Fracture of manubrium, subsequent encounter for fracture with routine healing: Secondary | ICD-10-CM | POA: Diagnosis not present

## 2022-02-12 DIAGNOSIS — R2681 Unsteadiness on feet: Secondary | ICD-10-CM | POA: Diagnosis not present

## 2022-02-12 DIAGNOSIS — S22000D Wedge compression fracture of unspecified thoracic vertebra, subsequent encounter for fracture with routine healing: Secondary | ICD-10-CM | POA: Diagnosis not present

## 2022-02-12 DIAGNOSIS — S2220XD Unspecified fracture of sternum, subsequent encounter for fracture with routine healing: Secondary | ICD-10-CM | POA: Diagnosis not present

## 2022-02-12 DIAGNOSIS — R52 Pain, unspecified: Secondary | ICD-10-CM | POA: Diagnosis not present

## 2022-02-12 DIAGNOSIS — S2222XD Fracture of body of sternum, subsequent encounter for fracture with routine healing: Secondary | ICD-10-CM | POA: Diagnosis not present

## 2022-02-12 DIAGNOSIS — Z7982 Long term (current) use of aspirin: Secondary | ICD-10-CM | POA: Insufficient documentation

## 2022-02-12 DIAGNOSIS — S52501A Unspecified fracture of the lower end of right radius, initial encounter for closed fracture: Secondary | ICD-10-CM | POA: Diagnosis not present

## 2022-02-12 DIAGNOSIS — S59911A Unspecified injury of right forearm, initial encounter: Secondary | ICD-10-CM | POA: Diagnosis present

## 2022-02-12 DIAGNOSIS — Y92009 Unspecified place in unspecified non-institutional (private) residence as the place of occurrence of the external cause: Secondary | ICD-10-CM | POA: Diagnosis not present

## 2022-02-12 DIAGNOSIS — S2222XA Fracture of body of sternum, initial encounter for closed fracture: Secondary | ICD-10-CM | POA: Diagnosis not present

## 2022-02-12 DIAGNOSIS — S0003XA Contusion of scalp, initial encounter: Secondary | ICD-10-CM | POA: Diagnosis not present

## 2022-02-12 DIAGNOSIS — M6281 Muscle weakness (generalized): Secondary | ICD-10-CM | POA: Diagnosis not present

## 2022-02-12 DIAGNOSIS — F411 Generalized anxiety disorder: Secondary | ICD-10-CM | POA: Diagnosis not present

## 2022-02-12 DIAGNOSIS — Z9181 History of falling: Secondary | ICD-10-CM | POA: Diagnosis not present

## 2022-02-12 DIAGNOSIS — M79631 Pain in right forearm: Secondary | ICD-10-CM | POA: Insufficient documentation

## 2022-02-12 DIAGNOSIS — J449 Chronic obstructive pulmonary disease, unspecified: Secondary | ICD-10-CM | POA: Diagnosis not present

## 2022-02-12 DIAGNOSIS — M4312 Spondylolisthesis, cervical region: Secondary | ICD-10-CM | POA: Diagnosis not present

## 2022-02-12 DIAGNOSIS — S22070D Wedge compression fracture of T9-T10 vertebra, subsequent encounter for fracture with routine healing: Secondary | ICD-10-CM | POA: Diagnosis not present

## 2022-02-12 DIAGNOSIS — M7989 Other specified soft tissue disorders: Secondary | ICD-10-CM | POA: Diagnosis not present

## 2022-02-12 DIAGNOSIS — S22080D Wedge compression fracture of T11-T12 vertebra, subsequent encounter for fracture with routine healing: Secondary | ICD-10-CM | POA: Diagnosis not present

## 2022-02-12 MED ORDER — TETANUS-DIPHTH-ACELL PERTUSSIS 5-2.5-18.5 LF-MCG/0.5 IM SUSY
0.5000 mL | PREFILLED_SYRINGE | Freq: Once | INTRAMUSCULAR | Status: AC
  Administered 2022-02-12: 0.5 mL via INTRAMUSCULAR
  Filled 2022-02-12: qty 0.5

## 2022-02-12 MED ORDER — OXYCODONE HCL 5 MG PO TABS: 5.0000 mg | ORAL_TABLET | Freq: Four times a day (QID) | ORAL | 0 refills | Status: DC | PRN

## 2022-02-12 MED ORDER — HYDROCODONE-ACETAMINOPHEN 5-325 MG PO TABS
1.0000 | ORAL_TABLET | Freq: Once | ORAL | Status: AC
  Administered 2022-02-12: 1 via ORAL
  Filled 2022-02-12: qty 1

## 2022-02-12 NOTE — ED Triage Notes (Signed)
Pt from SNF [facility]) BIB [facility] c/o unwitnessed fall while up ambulating w/o assistance. Possible deformity to right arm and laceration above right eye, bleeding controlled. + right radial pulse, sensation intact. Pt is NOT on thinners. Confused at baseline, VSS. Pt has hx of recent mechanical falls.

## 2022-02-12 NOTE — ED Notes (Signed)
Orth tech at bedside.  Splint applied at this time

## 2022-02-12 NOTE — Progress Notes (Signed)
Orthopedic Tech Progress Note Patient Details:  Katherine [name] [birth year] [id number]  Ortho Devices Type of Ortho Device: Shoulder immobilizer, Sugartong splint Ortho Device/Splint Location: rue Ortho Device/Splint Interventions: Ordered, Application, Adjustment   Post Interventions Patient Tolerated: Well  Edwina Barth 02/12/2022, 6:24 AM

## 2022-02-12 NOTE — Discharge Instructions (Signed)
Apply ice for 30 minutes at a time, 4 times a day.  Keep your wrist elevated is much as possible.  Take ibuprofen or naproxen as needed for pain.  For additional pain relief, add acetaminophen.  Reserve oxycodone for pain not relieved with the other medications.  Please follow-up with a hand specialist, call today for an appointment.

## 2022-02-12 NOTE — ED Notes (Signed)
[facility] called and transportation arranged for transport back to [facility].  Spoke to operator [phone]

## 2022-02-12 NOTE — ED Notes (Signed)
Patient currently in CT/xray

## 2022-02-12 NOTE — ED Notes (Signed)
Report attempted again with no answer. Report given to [facility]

## 2022-02-12 NOTE — ED Notes (Signed)
Ortho Tech called and made aware of order for short arm splint

## 2022-02-12 NOTE — ED Provider Notes (Addendum)
[facility] DEPT Provider Note   CSN: [id number] Arrival date & time: 02/12/22  0248     History  Chief Complaint  Patient presents with   Katherine [name] is a 88 y.o. female.  The history is provided by the patient and the nursing home.  Fall  She has history of hyperlipidemia, COPD and comes in from skilled nursing facility where she had an unwitnessed fall.  Patient states that she lost her balance and fell.  She suffered a laceration to the right side of her forehead and injury to her right forearm and hand.  She denies loss of consciousness.  She is not on any anticoagulants.  She denies other injury.  Last tetanus immunization is unknown.   Home Medications Prior to Admission medications   Medication Sig Start Date End Date Taking? Authorizing Provider  albuterol (VENTOLIN HFA) 108 (90 Base) MCG/ACT inhaler INHALE 2 PUFFS INTO THE LUNGS 4 (FOUR) TIMES DAILY AS NEEDED FOR WHEEZING OR SHORTNESS OF BREATH. Patient taking differently: Inhale 2 puffs into the lungs every 4 (four) hours as needed for wheezing or shortness of breath. 07/10/21  Yes Byrum, Rose Fillers, MD  ALPRAZolam Duanne Moron) 0.25 MG tablet Take 1 tablet (0.25 mg total) by mouth at bedtime as needed for anxiety or sleep. 02/04/22  Yes Amin, Jeanella Flattery, MD  aspirin EC 81 MG tablet Take 81 mg by mouth daily. Swallow whole.   Yes [provider]  bisacodyl (DULCOLAX) 10 MG suppository Place 1 suppository (10 mg total) rectally every 12 (twelve) hours as needed for moderate constipation or severe constipation. 02/04/22  Yes Amin, Ankit Chirag, MD  budesonide (PULMICORT) 0.5 MG/2ML nebulizer solution TAKE 2 MLS (0.5 MG TOTAL) BY NEBULIZATION 2 (TWO) TIMES DAILY. Patient taking differently: Take 0.5 mg by nebulization 2 (two) times daily. 09/23/21  Yes Cobb, Karie Schwalbe, NP  Cyanocobalamin (VITAMIN B-12 PO) Take 1 tablet by mouth daily.   Yes [provider]  cyclobenzaprine  (FLEXERIL) 5 MG tablet Take 5 mg by mouth at bedtime as needed. 02/06/22  Yes [provider]  diclofenac Sodium (VOLTAREN) 1 % GEL Apply 4 g topically 4 (four) times daily. 01/08/22  Yes Deno Etienne, DO  docusate sodium (COLACE) 100 MG capsule Take 1 capsule (100 mg total) by mouth 2 (two) times daily. 02/04/22  Yes Amin, Ankit Chirag, MD  escitalopram (LEXAPRO) 5 MG tablet TAKE 1 TABLET BY MOUTH EVERY DAY Patient taking differently: Take 5 mg by mouth at bedtime. 10/01/21  Yes Baxley, Cresenciano Lick, MD  fluticasone (FLONASE) 50 MCG/ACT nasal spray Place 1 spray into both nostrils daily. 07/24/21  Yes Cobb, Karie Schwalbe, NP  gabapentin (NEURONTIN) 100 MG capsule Take 1 capsule (100 mg total) by mouth 3 (three) times daily. Patient taking differently: Take 100 mg by mouth. 2 capsules at bedtime 1 in the morning 02/04/22  Yes Amin, Ankit Chirag, MD  ipratropium (ATROVENT) 0.02 % nebulizer solution TAKE 2.5 MLS (0.5 MG TOTAL) BY NEBULIZATION 4 (FOUR) TIMES DAILY. Patient taking differently: Take 0.5 mg by nebulization every 6 (six) hours as needed for wheezing or shortness of breath. 02/09/21  Yes Baxley, Cresenciano Lick, MD  Lidocaine (SALONPAS PAIN RELIEVING EX) Apply 1 Application topically 2 (two) times daily as needed (pain).   Yes [provider]  Melatonin 10 MG CAPS Take 10 mg by mouth at bedtime.   Yes [provider]  methocarbamol (ROBAXIN) 500 MG tablet Take 500 mg by  mouth every 6 (six) hours as needed for muscle spasms. 02/04/22  Yes [provider]  Multiple Vitamins-Minerals (MULTIVITAMIN WITH MINERALS) tablet Take 1 tablet by mouth daily.   Yes [provider]  oxyCODONE (OXY IR/ROXICODONE) 5 MG immediate release tablet Take 1 tablet (5 mg total) by mouth every 6 (six) hours as needed for severe pain or breakthrough pain. 02/04/22  Yes Amin, Ankit Chirag, MD  pantoprazole (PROTONIX) 40 MG tablet TAKE 1 TABLET BY MOUTH EVERY DAY Patient taking differently: Take 40 mg by  mouth daily. 10/14/21  Yes Cobb, Karie Schwalbe, NP  polyethylene glycol (MIRALAX / GLYCOLAX) 17 g packet Take 17 g by mouth daily. 02/04/22  Yes Amin, Ankit Chirag, MD  promethazine (PHENERGAN) 12.5 MG tablet TAKE 1 TABLET (12.5 MG TOTAL) BY MOUTH EVERY 8 (EIGHT) HOURS AS NEEDED FOR NAUSEA OR VOMITING. 09/13/21  Yes Baxley, Cresenciano Lick, MD  traMADol (ULTRAM) 50 MG tablet Take 1 tablet (50 mg total) by mouth 3 (three) times daily as needed for moderate pain (pain). 02/04/22  Yes Amin, Jeanella Flattery, MD  traMADol-acetaminophen (ULTRACET) 37.5-325 MG tablet Take by mouth. 02/06/22  Yes [provider]  iron polysaccharides (NIFEREX) 150 MG capsule TAKE 1 CAPSULE BY MOUTH TWICE A DAY Patient not taking: Reported on 02/12/2022 01/05/22   Elby Showers, MD  methocarbamol 1000 MG TABS Take 500 mg by mouth every 6 (six) hours as needed for muscle spasms. Patient not taking: Reported on 02/12/2022 02/04/22   Damita Lack, MD  traZODone (DESYREL) 50 MG tablet TAKE 3 TABLETS BY MOUTH AT BEDTIME Patient taking differently: Take 100 mg by mouth at bedtime. 11/24/21   Elby Showers, MD      Allergies    Patient has no known allergies.    Review of Systems   Review of Systems  All other systems reviewed and are negative.   Physical Exam Updated Vital Signs BP (!) 149/66   Pulse 81   Temp 98.2 F (36.8 C)   Resp (!) 21   Ht '5\' 1"'$  (1.549 m)   Wt 42.4 kg   SpO2 100%   BMI 17.65 kg/m  Physical Exam Vitals and nursing note reviewed.   88 year old female, resting comfortably and in no acute distress. Vital signs are significant for elevated blood pressure and borderline elevated respiratory rate. Oxygen saturation is 100%, which is normal. Head is normocephalic.  Superficial skin tear is noted superior and lateral to the right eyebrow without discrete laceration.  There is no swelling or deformity. PERRLA, EOMI. Oropharynx is clear. Neck is nontender without adenopathy or JVD. Back is nontender and  there is no CVA tenderness. Lungs are clear without rales, wheezes, or rhonchi. Chest is nontender. Heart has regular rate and rhythm without murmur. Abdomen is soft, flat, nontender. Extremities: There is mild swelling of the dorsum of the right hand with tenderness over the same area.  There is also mild tenderness to palpation over the right forearm diffusely without any point tenderness.  There is pain with passive range of motion.  Full range of motion of all other joints without pain. Skin is warm and dry without rash. Neurologic: Awake and alert, cranial nerves are intact, moves all extremities equally.   ED Results / Procedures / Treatments    Radiology CT Head Wo Contrast  Result Date: 02/12/2022 CLINICAL DATA:  Unwitnessed fall, head and neck trauma. Laceration above right eye. EXAM: CT HEAD WITHOUT CONTRAST CT CERVICAL SPINE WITHOUT CONTRAST  TECHNIQUE: Multidetector CT imaging of the head and cervical spine was performed following the standard protocol without intravenous contrast. Multiplanar CT image reconstructions of the cervical spine were also generated. RADIATION DOSE REDUCTION: This exam was performed according to the departmental dose-optimization program which includes automated exposure control, adjustment of the mA and/or kV according to patient size and/or use of iterative reconstruction technique. COMPARISON:  01/30/2022. FINDINGS: CT HEAD FINDINGS Brain: No acute intracranial hemorrhage, midline shift or mass effect. No extra-axial fluid collection. Periventricular white matter hypodensities are present bilaterally. No hydrocephalus. Vascular: No hyperdense vessel or unexpected calcification. Skull: Normal. Negative for fracture or focal lesion. Sinuses/Orbits: No acute finding. Other: Small scalp hematoma with laceration over the frontal bone on the right. CT CERVICAL SPINE FINDINGS Alignment: There is mild anterolisthesis at C2-C3 and C3-C4. Skull base and vertebrae: No  acute fracture. No primary bone lesion or focal pathologic process. Soft tissues and spinal canal: No prevertebral fluid or swelling. No visible canal hematoma. Disc levels: Multilevel intervertebral disc space narrowing, uncovertebral osteophyte formation, and facet arthropathy. Upper chest: Emphysematous changes in the lungs with right upper lobe scarring. Other: Carotid artery calcifications. Hypodensities are present in the left lobe of the thyroid gland measuring up to 9 mm. IMPRESSION: 1. No acute intracranial process. 2. Atrophy with chronic microvascular ischemic changes. 3. Small scalp hematoma with laceration over the frontal bone on the right. 4. Degenerative changes in the cervical spine without evidence of acute fracture. Electronically Signed   By: Brett Fairy M.D.   On: 02/12/2022 05:00   CT Cervical Spine Wo Contrast  Result Date: 02/12/2022 CLINICAL DATA:  Unwitnessed fall, head and neck trauma. Laceration above right eye. EXAM: CT HEAD WITHOUT CONTRAST CT CERVICAL SPINE WITHOUT CONTRAST TECHNIQUE: Multidetector CT imaging of the head and cervical spine was performed following the standard protocol without intravenous contrast. Multiplanar CT image reconstructions of the cervical spine were also generated. RADIATION DOSE REDUCTION: This exam was performed according to the departmental dose-optimization program which includes automated exposure control, adjustment of the mA and/or kV according to patient size and/or use of iterative reconstruction technique. COMPARISON:  01/30/2022. FINDINGS: CT HEAD FINDINGS Brain: No acute intracranial hemorrhage, midline shift or mass effect. No extra-axial fluid collection. Periventricular white matter hypodensities are present bilaterally. No hydrocephalus. Vascular: No hyperdense vessel or unexpected calcification. Skull: Normal. Negative for fracture or focal lesion. Sinuses/Orbits: No acute finding. Other: Small scalp hematoma with laceration over the  frontal bone on the right. CT CERVICAL SPINE FINDINGS Alignment: There is mild anterolisthesis at C2-C3 and C3-C4. Skull base and vertebrae: No acute fracture. No primary bone lesion or focal pathologic process. Soft tissues and spinal canal: No prevertebral fluid or swelling. No visible canal hematoma. Disc levels: Multilevel intervertebral disc space narrowing, uncovertebral osteophyte formation, and facet arthropathy. Upper chest: Emphysematous changes in the lungs with right upper lobe scarring. Other: Carotid artery calcifications. Hypodensities are present in the left lobe of the thyroid gland measuring up to 9 mm. IMPRESSION: 1. No acute intracranial process. 2. Atrophy with chronic microvascular ischemic changes. 3. Small scalp hematoma with laceration over the frontal bone on the right. 4. Degenerative changes in the cervical spine without evidence of acute fracture. Electronically Signed   By: Brett Fairy M.D.   On: 02/12/2022 05:00   DG Forearm Right  Result Date: 02/12/2022 CLINICAL DATA:  Fall with pain and distal forearm and wrist. EXAM: RIGHT HAND - COMPLETE 3+ VIEW; RIGHT FOREARM - 2 VIEW COMPARISON:  None Available. FINDINGS: There is a comminuted fracture of the distal radius with intra-articular extension and dorsal angulation of the distal fracture fragment. There is a nondisplaced impaction fracture of the distal ulnar metaphysis. Remaining bony structures appear intact. Degenerative changes are present at the wrist and interphalangeal joints. Soft tissue swelling is noted about the wrist. IMPRESSION: 1. Comminuted impaction fracture of the distal radius with dorsal angulation. 2. Nondisplaced fracture of the distal ulnar metaphysis. Electronically Signed   By: Brett Fairy M.D.   On: 02/12/2022 04:49   DG Hand Complete Right  Result Date: 02/12/2022 CLINICAL DATA:  Fall with pain and distal forearm and wrist. EXAM: RIGHT HAND - COMPLETE 3+ VIEW; RIGHT FOREARM - 2 VIEW COMPARISON:   None Available. FINDINGS: There is a comminuted fracture of the distal radius with intra-articular extension and dorsal angulation of the distal fracture fragment. There is a nondisplaced impaction fracture of the distal ulnar metaphysis. Remaining bony structures appear intact. Degenerative changes are present at the wrist and interphalangeal joints. Soft tissue swelling is noted about the wrist. IMPRESSION: 1. Comminuted impaction fracture of the distal radius with dorsal angulation. 2. Nondisplaced fracture of the distal ulnar metaphysis. Electronically Signed   By: Brett Fairy M.D.   On: 02/12/2022 04:49    Procedures .Splint Application  Date/Time: 02/12/2022 7:29 AM  Performed by: Delora Fuel, MD Authorized by: Delora Fuel, MD   Consent:    Consent obtained:  Verbal   Consent given by:  Patient   Risks, benefits, and alternatives were discussed: yes     Risks discussed:  Discoloration, pain and numbness   Alternatives discussed:  No treatment Universal protocol:    Procedure explained and questions answered to patient or proxy's satisfaction: yes     Relevant documents present and verified: yes     Test results available: yes     Imaging studies available: yes     Required blood products, implants, devices, and special equipment available: yes     Site/side marked: yes     Immediately prior to procedure a time out was called: yes     Patient identity confirmed:  Verbally with patient and arm band Pre-procedure details:    Distal neurologic exam:  Normal   Distal perfusion: brisk capillary refill   Procedure details:    Location:  Arm   Arm location:  R lower arm   Strapping: no     Splint type:  Sugar tong   Supplies:  Elastic bandage, fiberglass and sling Post-procedure details:    Distal neurologic exam:  Unchanged   Distal perfusion: unchanged     Procedure completion:  Tolerated well, no immediate complications   Post-procedure imaging: not applicable        Medications Ordered in ED Medications  Tdap (BOOSTRIX) injection 0.5 mL (has no administration in time range)    ED Course/ Medical Decision Making/ A&P                           Medical Decision Making Amount and/or Complexity of Data Reviewed Radiology: ordered.  Risk Prescription drug management.   Fall with skin tear to the forehead and injury to the right forearm.  I have ordered x-rays of the right forearm and right hand to evaluate for fracture, and I have ordered CT scans of head and cervical spine to look for evidence of intracranial injury or cervical spine fracture.  Old records are reviewed, and last  recorded tetanus immunization was in 2004.  I have ordered a Tdap booster.  CT scans of head and cervical spine showed no acute injury.  I have independently viewed the images, and agree with radiologist interpretation.  X-rays of the right forearm and hand show fracture of the distal radius and ulna with some dorsal angulation.  I have independently viewed the images, and agree with radiologist interpretation.  Case has been discussed with Dr. Greta Doom of hand surgery who has reviewed the images and requests sugar-tong splint be applied and he will follow the patient in his office.  Dermabond is applied to the skin tear.  She is given a prescription for oxycodone to take for pain but is advised to use over-the-counter NSAIDs and acetaminophen as her primary means of pain control.  She is to follow-up with hand surgery in the next week.  Final Clinical Impression(s) / ED Diagnoses Final diagnoses:  Fall at home, initial encounter  Closed fracture of distal ends of right radius and ulna, initial encounter    Rx / DC Orders ED Discharge Orders          Ordered    oxyCODONE (OXY IR/ROXICODONE) 5 MG immediate release tablet  Every 6 hours PRN,   Status:  Discontinued        02/12/22 0631    oxyCODONE (OXY IR/ROXICODONE) 5 MG immediate release tablet  Every 6 hours PRN         02/12/22 5615              Delora Fuel, MD 37/94/32 7614    Delora Fuel, MD 70/92/95 0730

## 2022-02-13 DIAGNOSIS — M6281 Muscle weakness (generalized): Secondary | ICD-10-CM | POA: Diagnosis not present

## 2022-02-13 DIAGNOSIS — F411 Generalized anxiety disorder: Secondary | ICD-10-CM | POA: Diagnosis not present

## 2022-02-13 DIAGNOSIS — R52 Pain, unspecified: Secondary | ICD-10-CM | POA: Diagnosis not present

## 2022-02-13 DIAGNOSIS — S2222XD Fracture of body of sternum, subsequent encounter for fracture with routine healing: Secondary | ICD-10-CM | POA: Diagnosis not present

## 2022-02-13 DIAGNOSIS — S2222XA Fracture of body of sternum, initial encounter for closed fracture: Secondary | ICD-10-CM | POA: Diagnosis not present

## 2022-02-13 DIAGNOSIS — S22000D Wedge compression fracture of unspecified thoracic vertebra, subsequent encounter for fracture with routine healing: Secondary | ICD-10-CM | POA: Diagnosis not present

## 2022-02-13 DIAGNOSIS — Z9181 History of falling: Secondary | ICD-10-CM | POA: Diagnosis not present

## 2022-02-13 DIAGNOSIS — S2221XD Fracture of manubrium, subsequent encounter for fracture with routine healing: Secondary | ICD-10-CM | POA: Diagnosis not present

## 2022-02-13 DIAGNOSIS — R410 Disorientation, unspecified: Secondary | ICD-10-CM | POA: Diagnosis not present

## 2022-02-13 DIAGNOSIS — S52601A Unspecified fracture of lower end of right ulna, initial encounter for closed fracture: Secondary | ICD-10-CM | POA: Diagnosis not present

## 2022-02-13 DIAGNOSIS — Z7189 Other specified counseling: Secondary | ICD-10-CM | POA: Diagnosis not present

## 2022-02-13 DIAGNOSIS — R2681 Unsteadiness on feet: Secondary | ICD-10-CM | POA: Diagnosis not present

## 2022-02-13 DIAGNOSIS — S52501A Unspecified fracture of the lower end of right radius, initial encounter for closed fracture: Secondary | ICD-10-CM | POA: Diagnosis not present

## 2022-02-18 ENCOUNTER — Telehealth: Payer: Self-pay | Admitting: *Deleted

## 2022-02-18 DIAGNOSIS — S22070D Wedge compression fracture of T9-T10 vertebra, subsequent encounter for fracture with routine healing: Secondary | ICD-10-CM | POA: Diagnosis not present

## 2022-02-18 DIAGNOSIS — S52601D Unspecified fracture of lower end of right ulna, subsequent encounter for closed fracture with routine healing: Secondary | ICD-10-CM | POA: Diagnosis not present

## 2022-02-18 DIAGNOSIS — R410 Disorientation, unspecified: Secondary | ICD-10-CM | POA: Diagnosis not present

## 2022-02-18 DIAGNOSIS — S22060D Wedge compression fracture of T7-T8 vertebra, subsequent encounter for fracture with routine healing: Secondary | ICD-10-CM | POA: Diagnosis not present

## 2022-02-18 DIAGNOSIS — S22000D Wedge compression fracture of unspecified thoracic vertebra, subsequent encounter for fracture with routine healing: Secondary | ICD-10-CM | POA: Diagnosis not present

## 2022-02-18 DIAGNOSIS — S2220XD Unspecified fracture of sternum, subsequent encounter for fracture with routine healing: Secondary | ICD-10-CM | POA: Diagnosis not present

## 2022-02-18 DIAGNOSIS — S2221XD Fracture of manubrium, subsequent encounter for fracture with routine healing: Secondary | ICD-10-CM | POA: Diagnosis not present

## 2022-02-18 DIAGNOSIS — G8929 Other chronic pain: Secondary | ICD-10-CM | POA: Diagnosis not present

## 2022-02-18 DIAGNOSIS — S2222XA Fracture of body of sternum, initial encounter for closed fracture: Secondary | ICD-10-CM | POA: Diagnosis not present

## 2022-02-18 DIAGNOSIS — Z9181 History of falling: Secondary | ICD-10-CM | POA: Diagnosis not present

## 2022-02-18 DIAGNOSIS — Z9189 Other specified personal risk factors, not elsewhere classified: Secondary | ICD-10-CM | POA: Diagnosis not present

## 2022-02-18 DIAGNOSIS — R52 Pain, unspecified: Secondary | ICD-10-CM | POA: Diagnosis not present

## 2022-02-18 DIAGNOSIS — R2681 Unsteadiness on feet: Secondary | ICD-10-CM | POA: Diagnosis not present

## 2022-02-18 DIAGNOSIS — M6281 Muscle weakness (generalized): Secondary | ICD-10-CM | POA: Diagnosis not present

## 2022-02-18 DIAGNOSIS — F411 Generalized anxiety disorder: Secondary | ICD-10-CM | POA: Diagnosis not present

## 2022-02-18 DIAGNOSIS — S2222XD Fracture of body of sternum, subsequent encounter for fracture with routine healing: Secondary | ICD-10-CM | POA: Diagnosis not present

## 2022-02-18 DIAGNOSIS — S52501D Unspecified fracture of the lower end of right radius, subsequent encounter for closed fracture with routine healing: Secondary | ICD-10-CM | POA: Diagnosis not present

## 2022-02-18 NOTE — Telephone Encounter (Signed)
     Patient  visit on 02/12/2022  at [facility]  was for fall  Have you been able to follow up with your primary care physician?  The patient was able to obtain any needed medicine or equipment.  Are there diet recommendations that you are having difficulty following?  Patient expresses understanding of discharge instructions and education provided has no other needs at this time.   [facility] [phone] [address] , [place] [address] Email : [email or web address]

## 2022-02-19 ENCOUNTER — Ambulatory Visit: Payer: Medicare PPO | Admitting: Emergency Medicine

## 2022-02-19 DIAGNOSIS — S52509A Unspecified fracture of the lower end of unspecified radius, initial encounter for closed fracture: Secondary | ICD-10-CM | POA: Insufficient documentation

## 2022-02-19 DIAGNOSIS — S52501A Unspecified fracture of the lower end of right radius, initial encounter for closed fracture: Secondary | ICD-10-CM | POA: Diagnosis not present

## 2022-02-19 DIAGNOSIS — R296 Repeated falls: Secondary | ICD-10-CM | POA: Diagnosis not present

## 2022-02-19 DIAGNOSIS — M25531 Pain in right wrist: Secondary | ICD-10-CM | POA: Diagnosis not present

## 2022-02-19 DIAGNOSIS — M1990 Unspecified osteoarthritis, unspecified site: Secondary | ICD-10-CM | POA: Diagnosis not present

## 2022-02-19 DIAGNOSIS — S2249XD Multiple fractures of ribs, unspecified side, subsequent encounter for fracture with routine healing: Secondary | ICD-10-CM | POA: Diagnosis not present

## 2022-02-19 DIAGNOSIS — F0390 Unspecified dementia without behavioral disturbance: Secondary | ICD-10-CM | POA: Diagnosis not present

## 2022-02-19 DIAGNOSIS — J9610 Chronic respiratory failure, unspecified whether with hypoxia or hypercapnia: Secondary | ICD-10-CM | POA: Diagnosis not present

## 2022-02-20 DIAGNOSIS — R2681 Unsteadiness on feet: Secondary | ICD-10-CM | POA: Diagnosis not present

## 2022-02-20 DIAGNOSIS — Z9181 History of falling: Secondary | ICD-10-CM | POA: Diagnosis not present

## 2022-02-20 DIAGNOSIS — S2221XD Fracture of manubrium, subsequent encounter for fracture with routine healing: Secondary | ICD-10-CM | POA: Diagnosis not present

## 2022-02-20 DIAGNOSIS — R52 Pain, unspecified: Secondary | ICD-10-CM | POA: Diagnosis not present

## 2022-02-20 DIAGNOSIS — S22000D Wedge compression fracture of unspecified thoracic vertebra, subsequent encounter for fracture with routine healing: Secondary | ICD-10-CM | POA: Diagnosis not present

## 2022-02-20 DIAGNOSIS — S2222XA Fracture of body of sternum, initial encounter for closed fracture: Secondary | ICD-10-CM | POA: Diagnosis not present

## 2022-02-20 DIAGNOSIS — S2222XD Fracture of body of sternum, subsequent encounter for fracture with routine healing: Secondary | ICD-10-CM | POA: Diagnosis not present

## 2022-02-20 DIAGNOSIS — F411 Generalized anxiety disorder: Secondary | ICD-10-CM | POA: Diagnosis not present

## 2022-02-20 DIAGNOSIS — M6281 Muscle weakness (generalized): Secondary | ICD-10-CM | POA: Diagnosis not present

## 2022-02-24 DIAGNOSIS — F411 Generalized anxiety disorder: Secondary | ICD-10-CM | POA: Diagnosis not present

## 2022-02-24 DIAGNOSIS — F5101 Primary insomnia: Secondary | ICD-10-CM | POA: Diagnosis not present

## 2022-02-24 DIAGNOSIS — F0390 Unspecified dementia without behavioral disturbance: Secondary | ICD-10-CM | POA: Diagnosis not present

## 2022-02-24 DIAGNOSIS — F1091 Alcohol use, unspecified, in remission: Secondary | ICD-10-CM | POA: Diagnosis not present

## 2022-02-24 DIAGNOSIS — F32A Depression, unspecified: Secondary | ICD-10-CM | POA: Diagnosis not present

## 2022-02-26 DIAGNOSIS — J449 Chronic obstructive pulmonary disease, unspecified: Secondary | ICD-10-CM | POA: Diagnosis not present

## 2022-02-26 DIAGNOSIS — S22060D Wedge compression fracture of T7-T8 vertebra, subsequent encounter for fracture with routine healing: Secondary | ICD-10-CM | POA: Diagnosis not present

## 2022-02-26 DIAGNOSIS — S52501D Unspecified fracture of the lower end of right radius, subsequent encounter for closed fracture with routine healing: Secondary | ICD-10-CM | POA: Diagnosis not present

## 2022-02-26 DIAGNOSIS — D509 Iron deficiency anemia, unspecified: Secondary | ICD-10-CM | POA: Diagnosis not present

## 2022-02-26 DIAGNOSIS — G8929 Other chronic pain: Secondary | ICD-10-CM | POA: Diagnosis not present

## 2022-02-26 DIAGNOSIS — F32A Depression, unspecified: Secondary | ICD-10-CM | POA: Diagnosis not present

## 2022-02-26 DIAGNOSIS — Z681 Body mass index (BMI) 19 or less, adult: Secondary | ICD-10-CM | POA: Diagnosis not present

## 2022-02-26 DIAGNOSIS — R410 Disorientation, unspecified: Secondary | ICD-10-CM | POA: Diagnosis not present

## 2022-02-26 DIAGNOSIS — R7989 Other specified abnormal findings of blood chemistry: Secondary | ICD-10-CM | POA: Diagnosis not present

## 2022-02-27 ENCOUNTER — Telehealth: Payer: Self-pay | Admitting: Internal Medicine

## 2022-02-27 DIAGNOSIS — S2221XD Fracture of manubrium, subsequent encounter for fracture with routine healing: Secondary | ICD-10-CM | POA: Diagnosis not present

## 2022-02-27 DIAGNOSIS — F0390 Unspecified dementia without behavioral disturbance: Secondary | ICD-10-CM | POA: Diagnosis not present

## 2022-02-27 DIAGNOSIS — S22060D Wedge compression fracture of T7-T8 vertebra, subsequent encounter for fracture with routine healing: Secondary | ICD-10-CM | POA: Diagnosis not present

## 2022-02-27 DIAGNOSIS — F1120 Opioid dependence, uncomplicated: Secondary | ICD-10-CM | POA: Diagnosis not present

## 2022-02-27 DIAGNOSIS — S2222XD Fracture of body of sternum, subsequent encounter for fracture with routine healing: Secondary | ICD-10-CM | POA: Diagnosis not present

## 2022-02-27 DIAGNOSIS — R296 Repeated falls: Secondary | ICD-10-CM | POA: Diagnosis not present

## 2022-02-27 NOTE — Telephone Encounter (Signed)
Shraddha Lebron [phone]  [name] called to give [place] an update on Katherine [name], he stated she fell and broke her wrist a couple weeks ago and she has been a lot more confused since then. They are going to get her an appointment with neurology about the confusion. I did let him know that you get the updates from the hospital and doctors offices.

## 2022-02-28 DIAGNOSIS — M6281 Muscle weakness (generalized): Secondary | ICD-10-CM | POA: Diagnosis not present

## 2022-02-28 DIAGNOSIS — R2681 Unsteadiness on feet: Secondary | ICD-10-CM | POA: Diagnosis not present

## 2022-02-28 DIAGNOSIS — R2689 Other abnormalities of gait and mobility: Secondary | ICD-10-CM | POA: Diagnosis not present

## 2022-02-28 DIAGNOSIS — S2222XD Fracture of body of sternum, subsequent encounter for fracture with routine healing: Secondary | ICD-10-CM | POA: Diagnosis not present

## 2022-03-02 ENCOUNTER — Other Ambulatory Visit: Payer: Self-pay | Admitting: Internal Medicine

## 2022-03-03 DIAGNOSIS — R52 Pain, unspecified: Secondary | ICD-10-CM | POA: Diagnosis not present

## 2022-03-03 DIAGNOSIS — M6281 Muscle weakness (generalized): Secondary | ICD-10-CM | POA: Diagnosis not present

## 2022-03-03 DIAGNOSIS — S2221XD Fracture of manubrium, subsequent encounter for fracture with routine healing: Secondary | ICD-10-CM | POA: Diagnosis not present

## 2022-03-03 DIAGNOSIS — S2222XA Fracture of body of sternum, initial encounter for closed fracture: Secondary | ICD-10-CM | POA: Diagnosis not present

## 2022-03-03 DIAGNOSIS — R2681 Unsteadiness on feet: Secondary | ICD-10-CM | POA: Diagnosis not present

## 2022-03-03 DIAGNOSIS — R2689 Other abnormalities of gait and mobility: Secondary | ICD-10-CM | POA: Diagnosis not present

## 2022-03-03 DIAGNOSIS — S22000D Wedge compression fracture of unspecified thoracic vertebra, subsequent encounter for fracture with routine healing: Secondary | ICD-10-CM | POA: Diagnosis not present

## 2022-03-03 DIAGNOSIS — Z9181 History of falling: Secondary | ICD-10-CM | POA: Diagnosis not present

## 2022-03-03 DIAGNOSIS — F411 Generalized anxiety disorder: Secondary | ICD-10-CM | POA: Diagnosis not present

## 2022-03-03 DIAGNOSIS — S2222XD Fracture of body of sternum, subsequent encounter for fracture with routine healing: Secondary | ICD-10-CM | POA: Diagnosis not present

## 2022-03-04 DIAGNOSIS — I10 Essential (primary) hypertension: Secondary | ICD-10-CM | POA: Diagnosis not present

## 2022-03-04 DIAGNOSIS — M6281 Muscle weakness (generalized): Secondary | ICD-10-CM | POA: Diagnosis not present

## 2022-03-04 DIAGNOSIS — R2689 Other abnormalities of gait and mobility: Secondary | ICD-10-CM | POA: Diagnosis not present

## 2022-03-04 DIAGNOSIS — R2681 Unsteadiness on feet: Secondary | ICD-10-CM | POA: Diagnosis not present

## 2022-03-04 DIAGNOSIS — S2222XD Fracture of body of sternum, subsequent encounter for fracture with routine healing: Secondary | ICD-10-CM | POA: Diagnosis not present

## 2022-03-05 DIAGNOSIS — M6281 Muscle weakness (generalized): Secondary | ICD-10-CM | POA: Diagnosis not present

## 2022-03-05 DIAGNOSIS — R2681 Unsteadiness on feet: Secondary | ICD-10-CM | POA: Diagnosis not present

## 2022-03-05 DIAGNOSIS — M25531 Pain in right wrist: Secondary | ICD-10-CM | POA: Diagnosis not present

## 2022-03-05 DIAGNOSIS — S52501A Unspecified fracture of the lower end of right radius, initial encounter for closed fracture: Secondary | ICD-10-CM | POA: Diagnosis not present

## 2022-03-05 DIAGNOSIS — S2222XD Fracture of body of sternum, subsequent encounter for fracture with routine healing: Secondary | ICD-10-CM | POA: Diagnosis not present

## 2022-03-05 DIAGNOSIS — R2689 Other abnormalities of gait and mobility: Secondary | ICD-10-CM | POA: Diagnosis not present

## 2022-03-06 DIAGNOSIS — R2681 Unsteadiness on feet: Secondary | ICD-10-CM | POA: Diagnosis not present

## 2022-03-06 DIAGNOSIS — S2222XD Fracture of body of sternum, subsequent encounter for fracture with routine healing: Secondary | ICD-10-CM | POA: Diagnosis not present

## 2022-03-06 DIAGNOSIS — M6281 Muscle weakness (generalized): Secondary | ICD-10-CM | POA: Diagnosis not present

## 2022-03-06 DIAGNOSIS — R2689 Other abnormalities of gait and mobility: Secondary | ICD-10-CM | POA: Diagnosis not present

## 2022-03-07 DIAGNOSIS — R2681 Unsteadiness on feet: Secondary | ICD-10-CM | POA: Diagnosis not present

## 2022-03-07 DIAGNOSIS — S2222XD Fracture of body of sternum, subsequent encounter for fracture with routine healing: Secondary | ICD-10-CM | POA: Diagnosis not present

## 2022-03-07 DIAGNOSIS — R2689 Other abnormalities of gait and mobility: Secondary | ICD-10-CM | POA: Diagnosis not present

## 2022-03-07 DIAGNOSIS — M6281 Muscle weakness (generalized): Secondary | ICD-10-CM | POA: Diagnosis not present

## 2022-03-08 DIAGNOSIS — S2222XD Fracture of body of sternum, subsequent encounter for fracture with routine healing: Secondary | ICD-10-CM | POA: Diagnosis not present

## 2022-03-08 DIAGNOSIS — R2681 Unsteadiness on feet: Secondary | ICD-10-CM | POA: Diagnosis not present

## 2022-03-08 DIAGNOSIS — R2689 Other abnormalities of gait and mobility: Secondary | ICD-10-CM | POA: Diagnosis not present

## 2022-03-08 DIAGNOSIS — M6281 Muscle weakness (generalized): Secondary | ICD-10-CM | POA: Diagnosis not present

## 2022-03-09 DIAGNOSIS — R2681 Unsteadiness on feet: Secondary | ICD-10-CM | POA: Diagnosis not present

## 2022-03-09 DIAGNOSIS — R2689 Other abnormalities of gait and mobility: Secondary | ICD-10-CM | POA: Diagnosis not present

## 2022-03-09 DIAGNOSIS — S2222XD Fracture of body of sternum, subsequent encounter for fracture with routine healing: Secondary | ICD-10-CM | POA: Diagnosis not present

## 2022-03-09 DIAGNOSIS — M6281 Muscle weakness (generalized): Secondary | ICD-10-CM | POA: Diagnosis not present

## 2022-03-10 DIAGNOSIS — M6281 Muscle weakness (generalized): Secondary | ICD-10-CM | POA: Diagnosis not present

## 2022-03-10 DIAGNOSIS — S2222XD Fracture of body of sternum, subsequent encounter for fracture with routine healing: Secondary | ICD-10-CM | POA: Diagnosis not present

## 2022-03-10 DIAGNOSIS — R2681 Unsteadiness on feet: Secondary | ICD-10-CM | POA: Diagnosis not present

## 2022-03-10 DIAGNOSIS — R2689 Other abnormalities of gait and mobility: Secondary | ICD-10-CM | POA: Diagnosis not present

## 2022-03-11 DIAGNOSIS — R2681 Unsteadiness on feet: Secondary | ICD-10-CM | POA: Diagnosis not present

## 2022-03-11 DIAGNOSIS — S2222XD Fracture of body of sternum, subsequent encounter for fracture with routine healing: Secondary | ICD-10-CM | POA: Diagnosis not present

## 2022-03-11 DIAGNOSIS — R2689 Other abnormalities of gait and mobility: Secondary | ICD-10-CM | POA: Diagnosis not present

## 2022-03-11 DIAGNOSIS — M6281 Muscle weakness (generalized): Secondary | ICD-10-CM | POA: Diagnosis not present

## 2022-03-12 DIAGNOSIS — R2681 Unsteadiness on feet: Secondary | ICD-10-CM | POA: Diagnosis not present

## 2022-03-12 DIAGNOSIS — R2689 Other abnormalities of gait and mobility: Secondary | ICD-10-CM | POA: Diagnosis not present

## 2022-03-12 DIAGNOSIS — S2222XD Fracture of body of sternum, subsequent encounter for fracture with routine healing: Secondary | ICD-10-CM | POA: Diagnosis not present

## 2022-03-12 DIAGNOSIS — M6281 Muscle weakness (generalized): Secondary | ICD-10-CM | POA: Diagnosis not present

## 2022-03-13 DIAGNOSIS — S2221XD Fracture of manubrium, subsequent encounter for fracture with routine healing: Secondary | ICD-10-CM | POA: Diagnosis not present

## 2022-03-13 DIAGNOSIS — S2222XA Fracture of body of sternum, initial encounter for closed fracture: Secondary | ICD-10-CM | POA: Diagnosis not present

## 2022-03-13 DIAGNOSIS — F411 Generalized anxiety disorder: Secondary | ICD-10-CM | POA: Diagnosis not present

## 2022-03-13 DIAGNOSIS — R2689 Other abnormalities of gait and mobility: Secondary | ICD-10-CM | POA: Diagnosis not present

## 2022-03-13 DIAGNOSIS — Z9181 History of falling: Secondary | ICD-10-CM | POA: Diagnosis not present

## 2022-03-13 DIAGNOSIS — R52 Pain, unspecified: Secondary | ICD-10-CM | POA: Diagnosis not present

## 2022-03-13 DIAGNOSIS — R2681 Unsteadiness on feet: Secondary | ICD-10-CM | POA: Diagnosis not present

## 2022-03-13 DIAGNOSIS — S2222XD Fracture of body of sternum, subsequent encounter for fracture with routine healing: Secondary | ICD-10-CM | POA: Diagnosis not present

## 2022-03-13 DIAGNOSIS — M6281 Muscle weakness (generalized): Secondary | ICD-10-CM | POA: Diagnosis not present

## 2022-03-13 DIAGNOSIS — S22000D Wedge compression fracture of unspecified thoracic vertebra, subsequent encounter for fracture with routine healing: Secondary | ICD-10-CM | POA: Diagnosis not present

## 2022-03-14 DIAGNOSIS — R2689 Other abnormalities of gait and mobility: Secondary | ICD-10-CM | POA: Diagnosis not present

## 2022-03-14 DIAGNOSIS — R2681 Unsteadiness on feet: Secondary | ICD-10-CM | POA: Diagnosis not present

## 2022-03-14 DIAGNOSIS — S2222XD Fracture of body of sternum, subsequent encounter for fracture with routine healing: Secondary | ICD-10-CM | POA: Diagnosis not present

## 2022-03-14 DIAGNOSIS — M6281 Muscle weakness (generalized): Secondary | ICD-10-CM | POA: Diagnosis not present

## 2022-03-17 DIAGNOSIS — M6281 Muscle weakness (generalized): Secondary | ICD-10-CM | POA: Diagnosis not present

## 2022-03-17 DIAGNOSIS — S2221XD Fracture of manubrium, subsequent encounter for fracture with routine healing: Secondary | ICD-10-CM | POA: Diagnosis not present

## 2022-03-17 DIAGNOSIS — R2689 Other abnormalities of gait and mobility: Secondary | ICD-10-CM | POA: Diagnosis not present

## 2022-03-17 DIAGNOSIS — R2681 Unsteadiness on feet: Secondary | ICD-10-CM | POA: Diagnosis not present

## 2022-03-18 DIAGNOSIS — S2221XD Fracture of manubrium, subsequent encounter for fracture with routine healing: Secondary | ICD-10-CM | POA: Diagnosis not present

## 2022-03-18 DIAGNOSIS — M6281 Muscle weakness (generalized): Secondary | ICD-10-CM | POA: Diagnosis not present

## 2022-03-18 DIAGNOSIS — R2689 Other abnormalities of gait and mobility: Secondary | ICD-10-CM | POA: Diagnosis not present

## 2022-03-18 DIAGNOSIS — Z7185 Encounter for immunization safety counseling: Secondary | ICD-10-CM | POA: Diagnosis not present

## 2022-03-18 DIAGNOSIS — Z23 Encounter for immunization: Secondary | ICD-10-CM | POA: Diagnosis not present

## 2022-03-18 DIAGNOSIS — R2681 Unsteadiness on feet: Secondary | ICD-10-CM | POA: Diagnosis not present

## 2022-03-19 DIAGNOSIS — R2689 Other abnormalities of gait and mobility: Secondary | ICD-10-CM | POA: Diagnosis not present

## 2022-03-19 DIAGNOSIS — M6281 Muscle weakness (generalized): Secondary | ICD-10-CM | POA: Diagnosis not present

## 2022-03-19 DIAGNOSIS — J449 Chronic obstructive pulmonary disease, unspecified: Secondary | ICD-10-CM | POA: Diagnosis not present

## 2022-03-19 DIAGNOSIS — R2681 Unsteadiness on feet: Secondary | ICD-10-CM | POA: Diagnosis not present

## 2022-03-19 DIAGNOSIS — S2221XD Fracture of manubrium, subsequent encounter for fracture with routine healing: Secondary | ICD-10-CM | POA: Diagnosis not present

## 2022-03-20 DIAGNOSIS — R2681 Unsteadiness on feet: Secondary | ICD-10-CM | POA: Diagnosis not present

## 2022-03-20 DIAGNOSIS — M6281 Muscle weakness (generalized): Secondary | ICD-10-CM | POA: Diagnosis not present

## 2022-03-20 DIAGNOSIS — F411 Generalized anxiety disorder: Secondary | ICD-10-CM | POA: Diagnosis not present

## 2022-03-20 DIAGNOSIS — R2689 Other abnormalities of gait and mobility: Secondary | ICD-10-CM | POA: Diagnosis not present

## 2022-03-20 DIAGNOSIS — S2221XD Fracture of manubrium, subsequent encounter for fracture with routine healing: Secondary | ICD-10-CM | POA: Diagnosis not present

## 2022-03-20 DIAGNOSIS — S2222XA Fracture of body of sternum, initial encounter for closed fracture: Secondary | ICD-10-CM | POA: Diagnosis not present

## 2022-03-20 DIAGNOSIS — S22000D Wedge compression fracture of unspecified thoracic vertebra, subsequent encounter for fracture with routine healing: Secondary | ICD-10-CM | POA: Diagnosis not present

## 2022-03-20 DIAGNOSIS — Z9181 History of falling: Secondary | ICD-10-CM | POA: Diagnosis not present

## 2022-03-20 DIAGNOSIS — R52 Pain, unspecified: Secondary | ICD-10-CM | POA: Diagnosis not present

## 2022-03-20 DIAGNOSIS — S2222XD Fracture of body of sternum, subsequent encounter for fracture with routine healing: Secondary | ICD-10-CM | POA: Diagnosis not present

## 2022-03-21 DIAGNOSIS — F0390 Unspecified dementia without behavioral disturbance: Secondary | ICD-10-CM | POA: Diagnosis not present

## 2022-03-21 DIAGNOSIS — R296 Repeated falls: Secondary | ICD-10-CM | POA: Diagnosis not present

## 2022-03-21 DIAGNOSIS — I959 Hypotension, unspecified: Secondary | ICD-10-CM | POA: Diagnosis not present

## 2022-03-21 DIAGNOSIS — R2681 Unsteadiness on feet: Secondary | ICD-10-CM | POA: Diagnosis not present

## 2022-03-21 DIAGNOSIS — S52601D Unspecified fracture of lower end of right ulna, subsequent encounter for closed fracture with routine healing: Secondary | ICD-10-CM | POA: Diagnosis not present

## 2022-03-21 DIAGNOSIS — F1120 Opioid dependence, uncomplicated: Secondary | ICD-10-CM | POA: Diagnosis not present

## 2022-03-21 DIAGNOSIS — R2689 Other abnormalities of gait and mobility: Secondary | ICD-10-CM | POA: Diagnosis not present

## 2022-03-21 DIAGNOSIS — M6281 Muscle weakness (generalized): Secondary | ICD-10-CM | POA: Diagnosis not present

## 2022-03-21 DIAGNOSIS — S52501D Unspecified fracture of the lower end of right radius, subsequent encounter for closed fracture with routine healing: Secondary | ICD-10-CM | POA: Diagnosis not present

## 2022-03-21 DIAGNOSIS — S2221XD Fracture of manubrium, subsequent encounter for fracture with routine healing: Secondary | ICD-10-CM | POA: Diagnosis not present

## 2022-03-23 DIAGNOSIS — M6281 Muscle weakness (generalized): Secondary | ICD-10-CM | POA: Diagnosis not present

## 2022-03-23 DIAGNOSIS — R2681 Unsteadiness on feet: Secondary | ICD-10-CM | POA: Diagnosis not present

## 2022-03-23 DIAGNOSIS — R2689 Other abnormalities of gait and mobility: Secondary | ICD-10-CM | POA: Diagnosis not present

## 2022-03-23 DIAGNOSIS — S2221XD Fracture of manubrium, subsequent encounter for fracture with routine healing: Secondary | ICD-10-CM | POA: Diagnosis not present

## 2022-03-24 DIAGNOSIS — R2681 Unsteadiness on feet: Secondary | ICD-10-CM | POA: Diagnosis not present

## 2022-03-24 DIAGNOSIS — S2221XD Fracture of manubrium, subsequent encounter for fracture with routine healing: Secondary | ICD-10-CM | POA: Diagnosis not present

## 2022-03-24 DIAGNOSIS — S2222XD Fracture of body of sternum, subsequent encounter for fracture with routine healing: Secondary | ICD-10-CM | POA: Diagnosis not present

## 2022-03-24 DIAGNOSIS — F411 Generalized anxiety disorder: Secondary | ICD-10-CM | POA: Diagnosis not present

## 2022-03-24 DIAGNOSIS — Z9181 History of falling: Secondary | ICD-10-CM | POA: Diagnosis not present

## 2022-03-24 DIAGNOSIS — S2222XA Fracture of body of sternum, initial encounter for closed fracture: Secondary | ICD-10-CM | POA: Diagnosis not present

## 2022-03-24 DIAGNOSIS — M6281 Muscle weakness (generalized): Secondary | ICD-10-CM | POA: Diagnosis not present

## 2022-03-24 DIAGNOSIS — R2689 Other abnormalities of gait and mobility: Secondary | ICD-10-CM | POA: Diagnosis not present

## 2022-03-24 DIAGNOSIS — R52 Pain, unspecified: Secondary | ICD-10-CM | POA: Diagnosis not present

## 2022-03-24 DIAGNOSIS — S22000D Wedge compression fracture of unspecified thoracic vertebra, subsequent encounter for fracture with routine healing: Secondary | ICD-10-CM | POA: Diagnosis not present

## 2022-03-25 ENCOUNTER — Ambulatory Visit (INDEPENDENT_AMBULATORY_CARE_PROVIDER_SITE_OTHER): Payer: Medicare PPO | Admitting: Emergency Medicine

## 2022-03-25 ENCOUNTER — Encounter: Payer: Self-pay | Admitting: Emergency Medicine

## 2022-03-25 DIAGNOSIS — S52501D Unspecified fracture of the lower end of right radius, subsequent encounter for closed fracture with routine healing: Secondary | ICD-10-CM | POA: Diagnosis not present

## 2022-03-25 DIAGNOSIS — F0390 Unspecified dementia without behavioral disturbance: Secondary | ICD-10-CM | POA: Diagnosis not present

## 2022-03-25 DIAGNOSIS — R2681 Unsteadiness on feet: Secondary | ICD-10-CM | POA: Diagnosis not present

## 2022-03-25 DIAGNOSIS — J9611 Chronic respiratory failure with hypoxia: Secondary | ICD-10-CM

## 2022-03-25 DIAGNOSIS — F1120 Opioid dependence, uncomplicated: Secondary | ICD-10-CM | POA: Diagnosis not present

## 2022-03-25 DIAGNOSIS — J449 Chronic obstructive pulmonary disease, unspecified: Secondary | ICD-10-CM | POA: Diagnosis not present

## 2022-03-25 DIAGNOSIS — R2689 Other abnormalities of gait and mobility: Secondary | ICD-10-CM | POA: Diagnosis not present

## 2022-03-25 DIAGNOSIS — S2221XD Fracture of manubrium, subsequent encounter for fracture with routine healing: Secondary | ICD-10-CM | POA: Diagnosis not present

## 2022-03-25 DIAGNOSIS — R296 Repeated falls: Secondary | ICD-10-CM | POA: Diagnosis not present

## 2022-03-25 DIAGNOSIS — M6281 Muscle weakness (generalized): Secondary | ICD-10-CM | POA: Diagnosis not present

## 2022-03-25 NOTE — Patient Instructions (Signed)
Agree with continuing your oxygen at 2 L/min at all times. We will continue your ipratropium (Atrovent) nebulizer treatments 3 times a day on a schedule. Continue budesonide (Pulmicort) nebulizer treatments 2 times a day on a schedule You can use albuterol 2 puffs up to every 4 hours if needed for shortness of breath, chest tightness, wheezing. Continue your physical therapy treatments. You are due for the flu shot. Would recommend that you get the COVID-19 vaccine this fall Follow with APP in 6 months or sooner if any problems Follow with Dr. Lamonte Sakai in 12 months or sooner if you have any problems.

## 2022-03-25 NOTE — Progress Notes (Signed)
   Subjective:    Patient ID: Katherine [name], female    DOB: 1928-07-29, 88 y.o.   MRN: [id number]  HPI  03/25/2022 --Katherine [name] is 56 and follows up today for her history of severe COPD.  She also has chronic cough with upper airway irritation syndrome and a remote positive PPD.  She underwent TAVR for severe aortic stenosis in February and was discharged on 2 L/min.  As of last visit we have been managing her on scheduled Atrovent nebs, scheduled budesonide nebs and albuterol as needed Unfortunately she has had multiple falls since last time I saw her, right radial fracture. Today she reports that she has been at rehab after a fall, and then fell and broke the wrist. Since she has been at rehab she has been on 2L/min at all times. She reports that her breathing is doing ok, good compliance w the O2. Minimal cough, no sputum. Hasn't yet had the flu slot.  Currently on atrovent nebs tid + budesonide nebs bid    06/22/18:  FEV1 0.94 (66) FVC 1.72 (89) Ratio 54% RV 131% TLC 106% DLCO/VA 40 % pred   Review of Systems  Constitutional:  Negative for activity change, fatigue and unexpected weight change.  HENT:  Positive for postnasal drip and voice change.   Eyes: Negative.   Respiratory:  Positive for shortness of breath.   Cardiovascular: Negative.   Gastrointestinal: Negative.   Endocrine: Negative.   Genitourinary: Negative.   Musculoskeletal:  Negative for back pain and joint swelling.  Skin: Negative.   Allergic/Immunologic: Negative.   Neurological:  Positive for weakness.  Hematological: Negative.   Psychiatric/Behavioral: Negative.          Objective:   Physical Exam Vitals:   03/25/22 1441  BP: 106/80  Pulse: 97  Temp: 98.1 F (36.7 C)  TempSrc: Oral  SpO2: 97%  Weight: 89 lb 12.8 oz (40.7 kg)  Height: '4\' 10"'$  (1.473 m)   Gen: Pleasant, thin elderly woman, kyphotic, in no distress,  normal affect  ENT: No lesions,  mouth clear,  oropharynx clear, no  postnasal drip  Neck: No JVD, no stridor  Lungs: No use of accessory muscles, distant, no wheeze on forced exp  Cardiovascular: RRR, holosystolic murmur  Musculoskeletal: No deformities, no cyanosis or clubbing  Neuro: alert, awake, poor memory but does attempt to answer questions  Skin: Warm, no lesions or rash      Assessment & Plan:  COPD, severe (HCC) We will continue your ipratropium (Atrovent) nebulizer treatments 3 times a day on a schedule. Continue budesonide (Pulmicort) nebulizer treatments 2 times a day on a schedule You can use albuterol 2 puffs up to every 4 hours if needed for shortness of breath, chest tightness, wheezing. Continue your physical therapy treatments. You are due for the flu shot. Would recommend that you get the COVID-19 vaccine this fall Follow with APP in 6 months or sooner if any problems Follow with Dr. Lamonte Sakai in 12 months or sooner if you have any problems.   Chronic respiratory failure with hypoxia (HCC) Agree with continuing your oxygen at 2 L/min at all times.  Baltazar Apo, MD, PhD 03/25/2022, 2:54 PM  Pulmonary and [facility] Care [phone] or if no answer [phone]

## 2022-03-25 NOTE — Assessment & Plan Note (Signed)
We will continue your ipratropium (Atrovent) nebulizer treatments 3 times a day on a schedule. Continue budesonide (Pulmicort) nebulizer treatments 2 times a day on a schedule You can use albuterol 2 puffs up to every 4 hours if needed for shortness of breath, chest tightness, wheezing. Continue your physical therapy treatments. You are due for the flu shot. Would recommend that you get the COVID-19 vaccine this fall Follow with APP in 6 months or sooner if any problems Follow with Dr. Lamonte Sakai in 12 months or sooner if you have any problems.

## 2022-03-25 NOTE — Assessment & Plan Note (Signed)
Agree with continuing your oxygen at 2 L/min at all times.

## 2022-03-26 DIAGNOSIS — S2221XD Fracture of manubrium, subsequent encounter for fracture with routine healing: Secondary | ICD-10-CM | POA: Diagnosis not present

## 2022-03-26 DIAGNOSIS — M6281 Muscle weakness (generalized): Secondary | ICD-10-CM | POA: Diagnosis not present

## 2022-03-26 DIAGNOSIS — R2681 Unsteadiness on feet: Secondary | ICD-10-CM | POA: Diagnosis not present

## 2022-03-26 DIAGNOSIS — R2689 Other abnormalities of gait and mobility: Secondary | ICD-10-CM | POA: Diagnosis not present

## 2022-03-26 DIAGNOSIS — S52501A Unspecified fracture of the lower end of right radius, initial encounter for closed fracture: Secondary | ICD-10-CM | POA: Diagnosis not present

## 2022-03-27 DIAGNOSIS — R2681 Unsteadiness on feet: Secondary | ICD-10-CM | POA: Diagnosis not present

## 2022-03-27 DIAGNOSIS — S2221XD Fracture of manubrium, subsequent encounter for fracture with routine healing: Secondary | ICD-10-CM | POA: Diagnosis not present

## 2022-03-27 DIAGNOSIS — R2689 Other abnormalities of gait and mobility: Secondary | ICD-10-CM | POA: Diagnosis not present

## 2022-03-27 DIAGNOSIS — M6281 Muscle weakness (generalized): Secondary | ICD-10-CM | POA: Diagnosis not present

## 2022-03-30 DIAGNOSIS — R2681 Unsteadiness on feet: Secondary | ICD-10-CM | POA: Diagnosis not present

## 2022-03-30 DIAGNOSIS — M6281 Muscle weakness (generalized): Secondary | ICD-10-CM | POA: Diagnosis not present

## 2022-03-30 DIAGNOSIS — S2221XD Fracture of manubrium, subsequent encounter for fracture with routine healing: Secondary | ICD-10-CM | POA: Diagnosis not present

## 2022-03-30 DIAGNOSIS — R2689 Other abnormalities of gait and mobility: Secondary | ICD-10-CM | POA: Diagnosis not present

## 2022-03-31 DIAGNOSIS — R2689 Other abnormalities of gait and mobility: Secondary | ICD-10-CM | POA: Diagnosis not present

## 2022-03-31 DIAGNOSIS — M6281 Muscle weakness (generalized): Secondary | ICD-10-CM | POA: Diagnosis not present

## 2022-03-31 DIAGNOSIS — S2221XD Fracture of manubrium, subsequent encounter for fracture with routine healing: Secondary | ICD-10-CM | POA: Diagnosis not present

## 2022-03-31 DIAGNOSIS — R2681 Unsteadiness on feet: Secondary | ICD-10-CM | POA: Diagnosis not present

## 2022-04-01 DIAGNOSIS — M6281 Muscle weakness (generalized): Secondary | ICD-10-CM | POA: Diagnosis not present

## 2022-04-01 DIAGNOSIS — R2681 Unsteadiness on feet: Secondary | ICD-10-CM | POA: Diagnosis not present

## 2022-04-01 DIAGNOSIS — K279 Peptic ulcer, site unspecified, unspecified as acute or chronic, without hemorrhage or perforation: Secondary | ICD-10-CM | POA: Diagnosis not present

## 2022-04-01 DIAGNOSIS — D509 Iron deficiency anemia, unspecified: Secondary | ICD-10-CM | POA: Diagnosis not present

## 2022-04-01 DIAGNOSIS — R2689 Other abnormalities of gait and mobility: Secondary | ICD-10-CM | POA: Diagnosis not present

## 2022-04-01 DIAGNOSIS — Z952 Presence of prosthetic heart valve: Secondary | ICD-10-CM | POA: Diagnosis not present

## 2022-04-01 DIAGNOSIS — F1021 Alcohol dependence, in remission: Secondary | ICD-10-CM | POA: Diagnosis not present

## 2022-04-01 DIAGNOSIS — R296 Repeated falls: Secondary | ICD-10-CM | POA: Diagnosis not present

## 2022-04-01 DIAGNOSIS — J9610 Chronic respiratory failure, unspecified whether with hypoxia or hypercapnia: Secondary | ICD-10-CM | POA: Diagnosis not present

## 2022-04-01 DIAGNOSIS — S2222XD Fracture of body of sternum, subsequent encounter for fracture with routine healing: Secondary | ICD-10-CM | POA: Diagnosis not present

## 2022-04-01 DIAGNOSIS — S2221XD Fracture of manubrium, subsequent encounter for fracture with routine healing: Secondary | ICD-10-CM | POA: Diagnosis not present

## 2022-04-01 DIAGNOSIS — S22000D Wedge compression fracture of unspecified thoracic vertebra, subsequent encounter for fracture with routine healing: Secondary | ICD-10-CM | POA: Diagnosis not present

## 2022-04-01 DIAGNOSIS — J449 Chronic obstructive pulmonary disease, unspecified: Secondary | ICD-10-CM | POA: Diagnosis not present

## 2022-04-02 DIAGNOSIS — M6281 Muscle weakness (generalized): Secondary | ICD-10-CM | POA: Diagnosis not present

## 2022-04-02 DIAGNOSIS — S2222XD Fracture of body of sternum, subsequent encounter for fracture with routine healing: Secondary | ICD-10-CM | POA: Diagnosis not present

## 2022-04-02 DIAGNOSIS — R2681 Unsteadiness on feet: Secondary | ICD-10-CM | POA: Diagnosis not present

## 2022-04-02 DIAGNOSIS — S2222XA Fracture of body of sternum, initial encounter for closed fracture: Secondary | ICD-10-CM | POA: Diagnosis not present

## 2022-04-02 DIAGNOSIS — S2221XD Fracture of manubrium, subsequent encounter for fracture with routine healing: Secondary | ICD-10-CM | POA: Diagnosis not present

## 2022-04-02 DIAGNOSIS — R52 Pain, unspecified: Secondary | ICD-10-CM | POA: Diagnosis not present

## 2022-04-02 DIAGNOSIS — S22000D Wedge compression fracture of unspecified thoracic vertebra, subsequent encounter for fracture with routine healing: Secondary | ICD-10-CM | POA: Diagnosis not present

## 2022-04-02 DIAGNOSIS — F411 Generalized anxiety disorder: Secondary | ICD-10-CM | POA: Diagnosis not present

## 2022-04-02 DIAGNOSIS — Z9181 History of falling: Secondary | ICD-10-CM | POA: Diagnosis not present

## 2022-04-02 DIAGNOSIS — R2689 Other abnormalities of gait and mobility: Secondary | ICD-10-CM | POA: Diagnosis not present

## 2022-04-03 DIAGNOSIS — R2689 Other abnormalities of gait and mobility: Secondary | ICD-10-CM | POA: Diagnosis not present

## 2022-04-03 DIAGNOSIS — S2221XD Fracture of manubrium, subsequent encounter for fracture with routine healing: Secondary | ICD-10-CM | POA: Diagnosis not present

## 2022-04-03 DIAGNOSIS — R2681 Unsteadiness on feet: Secondary | ICD-10-CM | POA: Diagnosis not present

## 2022-04-03 DIAGNOSIS — M6281 Muscle weakness (generalized): Secondary | ICD-10-CM | POA: Diagnosis not present

## 2022-04-04 DIAGNOSIS — R2689 Other abnormalities of gait and mobility: Secondary | ICD-10-CM | POA: Diagnosis not present

## 2022-04-04 DIAGNOSIS — M6281 Muscle weakness (generalized): Secondary | ICD-10-CM | POA: Diagnosis not present

## 2022-04-04 DIAGNOSIS — R2681 Unsteadiness on feet: Secondary | ICD-10-CM | POA: Diagnosis not present

## 2022-04-04 DIAGNOSIS — S2221XD Fracture of manubrium, subsequent encounter for fracture with routine healing: Secondary | ICD-10-CM | POA: Diagnosis not present

## 2022-04-07 DIAGNOSIS — S2221XD Fracture of manubrium, subsequent encounter for fracture with routine healing: Secondary | ICD-10-CM | POA: Diagnosis not present

## 2022-04-07 DIAGNOSIS — R2689 Other abnormalities of gait and mobility: Secondary | ICD-10-CM | POA: Diagnosis not present

## 2022-04-07 DIAGNOSIS — R2681 Unsteadiness on feet: Secondary | ICD-10-CM | POA: Diagnosis not present

## 2022-04-07 DIAGNOSIS — M6281 Muscle weakness (generalized): Secondary | ICD-10-CM | POA: Diagnosis not present

## 2022-04-08 DIAGNOSIS — Z8719 Personal history of other diseases of the digestive system: Secondary | ICD-10-CM | POA: Diagnosis not present

## 2022-04-08 DIAGNOSIS — S2221XD Fracture of manubrium, subsequent encounter for fracture with routine healing: Secondary | ICD-10-CM | POA: Diagnosis not present

## 2022-04-08 DIAGNOSIS — M6281 Muscle weakness (generalized): Secondary | ICD-10-CM | POA: Diagnosis not present

## 2022-04-08 DIAGNOSIS — R2689 Other abnormalities of gait and mobility: Secondary | ICD-10-CM | POA: Diagnosis not present

## 2022-04-08 DIAGNOSIS — R2681 Unsteadiness on feet: Secondary | ICD-10-CM | POA: Diagnosis not present

## 2022-04-08 DIAGNOSIS — R031 Nonspecific low blood-pressure reading: Secondary | ICD-10-CM | POA: Diagnosis not present

## 2022-04-08 DIAGNOSIS — Z09 Encounter for follow-up examination after completed treatment for conditions other than malignant neoplasm: Secondary | ICD-10-CM | POA: Diagnosis not present

## 2022-04-09 DIAGNOSIS — M6281 Muscle weakness (generalized): Secondary | ICD-10-CM | POA: Diagnosis not present

## 2022-04-09 DIAGNOSIS — R2689 Other abnormalities of gait and mobility: Secondary | ICD-10-CM | POA: Diagnosis not present

## 2022-04-09 DIAGNOSIS — S2221XD Fracture of manubrium, subsequent encounter for fracture with routine healing: Secondary | ICD-10-CM | POA: Diagnosis not present

## 2022-04-09 DIAGNOSIS — R2681 Unsteadiness on feet: Secondary | ICD-10-CM | POA: Diagnosis not present

## 2022-04-10 ENCOUNTER — Telehealth: Payer: Self-pay

## 2022-04-10 ENCOUNTER — Encounter: Payer: Self-pay | Admitting: Neurology

## 2022-04-10 ENCOUNTER — Encounter: Payer: Self-pay | Admitting: Physician Assistant

## 2022-04-10 ENCOUNTER — Ambulatory Visit (INDEPENDENT_AMBULATORY_CARE_PROVIDER_SITE_OTHER): Payer: Medicare PPO | Admitting: Neurology

## 2022-04-10 VITALS — BP 107/55 | HR 89

## 2022-04-10 DIAGNOSIS — F02B11 Dementia in other diseases classified elsewhere, moderate, with agitation: Secondary | ICD-10-CM

## 2022-04-10 DIAGNOSIS — S2222XA Fracture of body of sternum, initial encounter for closed fracture: Secondary | ICD-10-CM | POA: Diagnosis not present

## 2022-04-10 DIAGNOSIS — I10 Essential (primary) hypertension: Secondary | ICD-10-CM | POA: Diagnosis not present

## 2022-04-10 DIAGNOSIS — F411 Generalized anxiety disorder: Secondary | ICD-10-CM | POA: Diagnosis not present

## 2022-04-10 DIAGNOSIS — S22000D Wedge compression fracture of unspecified thoracic vertebra, subsequent encounter for fracture with routine healing: Secondary | ICD-10-CM | POA: Diagnosis not present

## 2022-04-10 DIAGNOSIS — G301 Alzheimer's disease with late onset: Secondary | ICD-10-CM | POA: Diagnosis not present

## 2022-04-10 DIAGNOSIS — S2221XD Fracture of manubrium, subsequent encounter for fracture with routine healing: Secondary | ICD-10-CM | POA: Diagnosis not present

## 2022-04-10 DIAGNOSIS — M6281 Muscle weakness (generalized): Secondary | ICD-10-CM | POA: Diagnosis not present

## 2022-04-10 DIAGNOSIS — R52 Pain, unspecified: Secondary | ICD-10-CM | POA: Diagnosis not present

## 2022-04-10 DIAGNOSIS — R2681 Unsteadiness on feet: Secondary | ICD-10-CM | POA: Diagnosis not present

## 2022-04-10 DIAGNOSIS — Z9181 History of falling: Secondary | ICD-10-CM | POA: Diagnosis not present

## 2022-04-10 DIAGNOSIS — E119 Type 2 diabetes mellitus without complications: Secondary | ICD-10-CM | POA: Diagnosis not present

## 2022-04-10 DIAGNOSIS — S2222XD Fracture of body of sternum, subsequent encounter for fracture with routine healing: Secondary | ICD-10-CM | POA: Diagnosis not present

## 2022-04-10 MED ORDER — DONEPEZIL HCL 5 MG PO TABS: 5.0000 mg | ORAL_TABLET | Freq: Every day | ORAL | 6 refills | Status: DC

## 2022-04-10 NOTE — Progress Notes (Signed)
[facility]  PATIENT: Katherine [name] DOB: 04-13-1929  REQUESTING CLINICIAN: Raymondo Band, MD HISTORY FROM: Patient, husband and chart review  REASON FOR VISIT: Memory problem   HISTORICAL  CHIEF COMPLAINT:  Chief Complaint  Patient presents with   Memory Loss    RM 13 with spouse [name]  Pt is well, spouse states she is having some short term memory issues for a few months. Since about July-aug      HISTORY OF PRESENT ILLNESS:  This is a 88 year old woman past medical history of COPD on supplemental O2, valvular disease status post valve replacement, chronic pain, depression, history of multiple falls who is presenting for worsening memory.  Per husband, patient has been having multiple falls, was seen in the ED in July and had an acute sternal fracture.  She has presented to the hospital again due to falls and has additional fractures.  In August she was sent to rehab [facility].  Husband reports while in [place] patient is not eating well, she is weak, she cannot stand without assistance to the point that she had 2 additional falls. Husband reports that patient memory continues to get worse, she is forgetful, she is paranoid about her husband, she is agitated in regard to her husband, When I ask about her own memory patient report that she is fine but her husband is making things up, her husband is keeping her at [facility], he is make everything worse   TBI:   No past history of TBI Stroke:    no past history of stroke Seizures:   no past history of seizures Sleep:   no history of sleep apnea.  Mood: Yes, denies anxiety and depression Family history of Dementia: Denies  Functional status: Dependent in most ADLs and IADLs Patient lives at [facility]. Cooking: with husband  Cleaning: husband  Shopping: no  Bathing: with assistance  Toileting: with assistance  Driving: Has not driven in the past 2 years due to mobility issues  Bills: Husband   Medications: Husband helping with medicine Denies  Ever left the stove on by accident?: denies  Forget how to use items around the house?: denies  Getting lost going to familiar places?: denies  Forgetting loved ones names?: denies  Word finding difficulty? Denies  Sleep: ok    OTHER MEDICAL CONDITIONS: COPD, Valvular heart disease, depression, chronic pain   REVIEW OF SYSTEMS: Full 14 system review of systems performed and negative with exception of: Unable to fully obtain   ALLERGIES: No Known Allergies  HOME MEDICATIONS: Outpatient Medications Prior to Visit  Medication Sig Dispense Refill   acetaminophen (TYLENOL) 325 MG tablet Take 650 mg by mouth as needed.     albuterol (VENTOLIN HFA) 108 (90 Base) MCG/ACT inhaler INHALE 2 PUFFS INTO THE LUNGS 4 (FOUR) TIMES DAILY AS NEEDED FOR WHEEZING OR SHORTNESS OF BREATH. 36 each 6   ALPRAZolam (XANAX) 0.25 MG tablet Take 1 tablet (0.25 mg total) by mouth at bedtime as needed for anxiety or sleep. 10 tablet 0   aspirin EC 81 MG tablet Take 81 mg by mouth daily. Swallow whole.     bisacodyl (DULCOLAX) 10 MG suppository Place 1 suppository (10 mg total) rectally every 12 (twelve) hours as needed for moderate constipation or severe constipation. 12 suppository 0   budesonide (PULMICORT) 0.5 MG/2ML nebulizer solution TAKE 2 MLS (0.5 MG TOTAL) BY NEBULIZATION 2 (TWO) TIMES DAILY. 120 mL 5   Cyanocobalamin (VITAMIN B-12 PO) Take 1 tablet  by mouth daily.     diclofenac Sodium (VOLTAREN) 1 % GEL Apply 4 g topically 4 (four) times daily. 100 g 0   docusate sodium (COLACE) 100 MG capsule Take 1 capsule (100 mg total) by mouth 2 (two) times daily. 10 capsule 0   escitalopram (LEXAPRO) 5 MG tablet TAKE 1 TABLET BY MOUTH EVERY DAY 90 tablet 2   fluticasone (FLONASE) 50 MCG/ACT nasal spray Place 1 spray into both nostrils daily. 18.2 mL 2   gabapentin (NEURONTIN) 100 MG capsule Take 1 capsule (100 mg total) by mouth 3 (three) times daily.      hydrOXYzine (ATARAX) 10 MG tablet Take 10 mg by mouth daily as needed.     ipratropium (ATROVENT) 0.02 % nebulizer solution TAKE 2.5 MLS (0.5 MG TOTAL) BY NEBULIZATION 4 (FOUR) TIMES DAILY. 62.5 mL 12   lidocaine (HM LIDOCAINE PATCH) 4 % Place 1 patch onto the skin daily.     Lidocaine (SALONPAS PAIN RELIEVING EX) Apply 1 Application topically 2 (two) times daily as needed (pain).     Melatonin 10 MG CAPS Take 10 mg by mouth at bedtime.     Multiple Vitamins-Minerals (MULTIVITAMIN WITH MINERALS) tablet Take 1 tablet by mouth daily.     ondansetron (ZOFRAN) 4 MG/5ML solution Take by mouth once.     oxyCODONE (OXY IR/ROXICODONE) 5 MG immediate release tablet Take 1 tablet (5 mg total) by mouth every 6 (six) hours as needed for severe pain or breakthrough pain. 15 tablet 0   pantoprazole (PROTONIX) 40 MG tablet TAKE 1 TABLET BY MOUTH EVERY DAY 90 tablet 3   polyethylene glycol (MIRALAX / GLYCOLAX) 17 g packet Take 17 g by mouth daily. 14 each 0   traMADol-acetaminophen (ULTRACET) 37.5-325 MG tablet Take by mouth.     traZODone (DESYREL) 50 MG tablet TAKE 3 TABLETS BY MOUTH AT BEDTIME 270 tablet 0   cyclobenzaprine (FLEXERIL) 5 MG tablet Take 5 mg by mouth at bedtime as needed. (Patient not taking: Reported on 04/10/2022)     iron polysaccharides (NIFEREX) 150 MG capsule TAKE 1 CAPSULE BY MOUTH TWICE A DAY (Patient not taking: Reported on 04/10/2022) 180 capsule 1   methocarbamol (ROBAXIN) 500 MG tablet Take 500 mg by mouth every 6 (six) hours as needed for muscle spasms. (Patient not taking: Reported on 04/10/2022)     methocarbamol 1000 MG TABS Take 500 mg by mouth every 6 (six) hours as needed for muscle spasms. (Patient not taking: Reported on 04/10/2022) 15 tablet 0   promethazine (PHENERGAN) 12.5 MG tablet TAKE 1 TABLET (12.5 MG TOTAL) BY MOUTH EVERY 8 (EIGHT) HOURS AS NEEDED FOR NAUSEA OR VOMITING. (Patient not taking: Reported on 04/10/2022) 30 tablet 3   No facility-administered medications  prior to visit.    PAST MEDICAL HISTORY: Past Medical History:  Diagnosis Date   Allergy    Anemia    Arthritis    Asthma    patient denies   B12 deficiency    COPD (chronic obstructive pulmonary disease) ([employer])    Depression    not recent   Diverticulosis    Esophageal stricture    Hepatic cyst 01/30/2010   Hiatal hernia 1985   patient unaware   Hx of adenomatous colonic polyps 2006   Hyperlipidemia    IBS (irritable bowel syndrome)    PONV (postoperative nausea and vomiting)    no nausea or vomitting after surgery 07/2014   Positive PPD    PUD (peptic ulcer disease)  S/P TAVR (transcatheter aortic valve replacement) 08/13/2021   s/p TAVR with a 81m Edwards S3UR via the right carotid approach by Dr. MAngelena Form& Dr. BCyndia Bent  Severe aortic stenosis     PAST SURGICAL HISTORY: Past Surgical History:  Procedure Laterality Date   ABDOMINAL HYSTERECTOMY     abnormal bleeding   APPENDECTOMY     BARTHOLIN GLAND CYST EXCISION     x2   BREAST BIOPSY Bilateral    Milk glnd   CATARACT EXTRACTION W/ INTRAOCULAR LENS  IMPLANT, BILATERAL Bilateral    COLONOSCOPY     COLONOSCOPY W/ POLYPECTOMY     HARDWARE REMOVAL N/A 12/13/2015   Procedure: Removal of HARDWARE  Lumbar Five-Sacral One ;  Surgeon: GKary Kos MD;  [facility];  Service: Neurosurgery;  Laterality: N/A;   IR KYPHO LUMBAR INC FX REDUCE BONE BX UNI/BIL CANNULATION INC/IMAGING  07/13/2019   PERICARDIAL WINDOW  08/13/2021   Procedure: PERICARDIAL WINDOW;  Surgeon: MBurnell Blanks MD;  [facility]  Service: Open Heart Surgery;;   RIGHT/LEFT HEART CATH AND CORONARY ANGIOGRAPHY N/A 07/11/2021   Procedure: RIGHT/LEFT HEART CATH AND CORONARY ANGIOGRAPHY;  Surgeon: MBurnell Blanks MD;  [facility];  Service: Cardiovascular;  Laterality: N/A;   TEE WITHOUT CARDIOVERSION N/A 08/13/2021   Procedure: TRANSESOPHAGEAL ECHOCARDIOGRAM (TEE);  Surgeon: MBurnell Blanks MD;   [facility]  Service: Open Heart Surgery;  Laterality: N/A;   TRANSCATHETER AORTIC VALVE REPLACEMENT, CAROTID Right 08/13/2021   Procedure: Transcatheter Aortic Valve Replacement-Carotid;  Surgeon: MBurnell Blanks MD;  [place] [facility]  Service: Open Heart Surgery;  Laterality: Right;   UPPER GI ENDOSCOPY     VAGOTOMY AND PYLOROPLASTY     in [place], [place]    FAMILY HISTORY: Family History  Problem Relation Age of Onset   Ulcers Father    Stroke Father    Irritable bowel syndrome Father    Ovarian cancer Sister    Colon cancer Neg Hx     SOCIAL HISTORY: Social History   Socioeconomic History   Marital status: Married    Spouse name: Not on file   Number of children: 2   Years of education: Not on file   Highest education level: Not on file  Occupational History    Employer: RETIRED   Occupation: Retired-office work  Tobacco Use   Smoking status: Former    Years: 60.00    Types: Cigarettes    Quit date: 02/02/2010    Years since quitting: 12.1   Smokeless tobacco: Never  Vaping Use   Vaping Use: Never used  Substance and Sexual Activity   Alcohol use: No    Comment: quit in 1983   Drug use: No   Sexual activity: Not Currently  Other Topics Concern   Not on file  Social History Narrative   Daily caffeine    Social Determinants of Health   Financial Resource Strain: Not on file  Food Insecurity: Not on file  Transportation Needs: Not on file  Physical Activity: Not on file  Stress: Not on file  Social Connections: Not on file  Intimate Partner Violence: Not on file    PHYSICAL EXAM  GENERAL EXAM/CONSTITUTIONAL: Vitals:  Vitals:   04/10/22 1321  BP: (!) 107/55  Pulse: 89   There is no height or weight on file to calculate BMI. Wt Readings from Last 3 Encounters:  03/25/22 89 lb 12.8 oz (40.7 kg)  02/12/22 93 lb 6.4 oz (  42.4 kg)  01/08/22 94 lb 12.8 oz (43 kg)   Patient appears annoyed and agitated; thin and looks frail,     EYES: Pupils round and reactive to light, Visual fields full to confrontation, Extraocular movements intacts,   MUSCULOSKELETAL: Gait, strength, tone, movements noted in Neurologic exam below  NEUROLOGIC: MENTAL STATUS:      No data to display            04/10/2022    1:20 PM  Montreal Cognitive Assessment   Visuospatial/ Executive (0/5) 0  Naming (0/3) 2  Attention: Read list of digits (0/2) 1  Attention: Read list of letters (0/1) 0  Attention: Serial 7 subtraction starting at 100 (0/3) 0  Language: Repeat phrase (0/2) 1  Language : Fluency (0/1) 0  Abstraction (0/2) 1  Delayed Recall (0/5) 0  Orientation (0/6) 3  Total 8     CRANIAL NERVE:  2nd, 3rd, 4th, 6th - pupils equal and reactive to light, visual fields full to confrontation, extraocular muscles intact, no nystagmus 5th - facial sensation symmetric 7th - facial strength symmetric 8th - hearing intact 9th - palate elevates symmetrically, uvula midline 11th - shoulder shrug symmetric 12th - tongue protrusion midline  MOTOR:  Decrease muscle bulk, Extremity at leas antigravity   SENSORY:  normal and symmetric to light touch  COORDINATION:  finger-nose-finger, fine finger movements normal  GAIT/STATION:  Deferred      DIAGNOSTIC DATA (LABS, IMAGING, TESTING) - I reviewed patient records, labs, notes, testing and imaging myself where available.  Lab Results  Component Value Date   WBC 6.3 04/10/2022   HGB 10.2 (L) 04/10/2022   HCT 31.4 (L) 04/10/2022   MCV 91 04/10/2022   PLT 231 04/10/2022      Component Value Date/Time   NA 142 04/10/2022 1410   K 4.1 04/10/2022 1410   CL 103 04/10/2022 1410   CO2 26 04/10/2022 1410   GLUCOSE 95 04/10/2022 1410   GLUCOSE 80 02/02/2022 0801   BUN 18 04/10/2022 1410   CREATININE 0.92 04/10/2022 1410   CREATININE 1.10 (H) 11/28/2021 1106   CALCIUM 9.6 04/10/2022 1410   PROT 6.1 04/10/2022 1410   ALBUMIN 3.8 04/10/2022 1410   AST 24 04/10/2022  1410   AST 37 09/25/2021 1359   ALT 13 04/10/2022 1410   ALT 16 09/25/2021 1359   ALKPHOS 76 04/10/2022 1410   BILITOT <0.2 04/10/2022 1410   BILITOT <0.1 (L) 09/25/2021 1359   GFRNONAA 47 (L) 02/02/2022 0801   GFRNONAA 49 (L) 09/25/2021 1359   GFRNONAA 50 (L) 11/27/2020 0931   GFRAA 58 (L) 11/27/2020 0931   Lab Results  Component Value Date   CHOL 206 (H) 11/28/2021   HDL 68 11/28/2021   LDLCALC 115 (H) 11/28/2021   TRIG 124 11/28/2021   CHOLHDL 3.0 11/28/2021   Lab Results  Component Value Date   HGBA1C 5.2 07/09/2017   Lab Results  Component Value Date   VITAMINB12 1,463 (H) 10/24/2021   Lab Results  Component Value Date   TSH 2.88 11/28/2021    Head CT 02/12/22 1. No acute intracranial process. 2. Atrophy with chronic microvascular ischemic changes. 3. Small scalp hematoma with laceration over the frontal bone on the right.    ASSESSMENT AND PLAN  88 y.o. year old female with COPD on supplemental O2, valvular heart disease status post valve replacement, chronic pain, depression, history of multiple falls who is presenting for worsening memory and agitation. Per husband, patient is  forgetful, asking the same questions, needs assistance in most ADLs; this is also due to her own physical limitations.  On exam today she clearly appears agitated, cursing at her husband.  She scored a 8 out of 30 on the [place] which is concerning with for severe impairment but again not sure if patient was willing to participate because of agitation.  I do believe at baseline patient does have likely dementia, this may be worsened by her current state of depression versus delirium.  I will do basic lab work today, she is unable to give a urine sample but I will recommend to check a urine when she is back at [place].  Due to her ongoing agitation, I will recommend to increase Lexapro to 20 mg daily, we will recommend also to start Aricept 5 mg nightly.  I would like to see her again in 6 months  hopefully by then her agitation will resolve and we will get a better picture of her memory.   1. Moderate late onset Alzheimer's dementia with agitation [facility])      Patient Instructions  CMP and CBC today  Check urinalysis at [facility] Increase Lexapro to 20 mg daily  Encourage PO intake  Start Aricept 5 mg nightly  Continue your other medications  Follow up in 6 months  There are well-accepted and sensible ways to reduce risk for Alzheimers disease and other degenerative brain disorders .  Exercise Daily Walk A daily 20 minute walk should be part of your routine. Disease related apathy can be a significant roadblock to exercise and the only way to overcome this is to make it a daily routine and perhaps have a reward at the end (something your loved one loves to eat or drink perhaps) or a personal trainer coming to the home can also be very useful. Most importantly, the patient is much more likely to exercise if the caregiver / spouse does it with him/her. In general a structured, repetitive schedule is best.  General Health: Any diseases which effect your body will effect your brain such as a pneumonia, urinary infection, blood clot, heart attack or stroke. Keep contact with your primary care doctor for regular follow ups.  Sleep. A good nights sleep is healthy for the brain. Seven hours is recommended. If you have insomnia or poor sleep habits we can give you some instructions. If you have sleep apnea wear your mask.  Diet: Eating a heart healthy diet is also a good idea; fish and poultry instead of red meat, nuts (mostly non-peanuts), vegetables, fruits, olive oil or canola oil (instead of butter), minimal salt (use other spices to flavor foods), whole grain rice, bread, cereal and pasta and wine in moderation.Research is now showing that the MIND diet, which is a combination of The Mediterranean diet and the DASH diet, is beneficial for cognitive processing and longevity. Information about  this diet can be found in The MIND Diet, a book by Doyne Keel, MS, RDN, and online at [employer]bookDistributors.si  Finances, Power of Attorney and Advance Directives: You should consider putting legal safeguards in place with regard to financial and medical decision making. While the spouse always has power of attorney for medical and financial issues in the absence of any form, you should consider what you want in case the spouse / caregiver is no longer around or capable of making decisions.   Orders Placed This Encounter  Procedures   CMP   CBC (no diff)    Meds ordered this  encounter  Medications   donepezil (ARICEPT) 5 MG tablet    Sig: Take 1 tablet (5 mg total) by mouth at bedtime.    Dispense:  30 tablet    Refill:  6    Return in about 6 months (around 10/10/2022).  I have spent a total of 60 minutes dedicated to this patient today, preparing to see patient, performing a medically appropriate examination and evaluation, ordering tests and/or medications and procedures, and counseling and educating the patient/family/caregiver; independently interpreting result and communicating results to the family/patient/caregiver; and documenting clinical information in the electronic medical record.   Alric Ran, MD 04/11/2022, 3:05 PM  [facility] [address] [place], [place] [address] [phone]    Addendum 04/10/22 Patient husband called asking to speak to provider, stating that he has additional information that he wanted to share but did not want to further upset patient. This Probation officer was unable to talk to husband due to seeing other patients and I did ask him to log into mychart and send me a detailed message.

## 2022-04-10 NOTE — Patient Instructions (Addendum)
CMP and CBC today  Check urinalysis at [facility] Increase Lexapro to 20 mg daily  Encourage PO intake  Start Aricept 5 mg nightly  Continue your other medications  Follow up in 6 months  There are well-accepted and sensible ways to reduce risk for Alzheimers disease and other degenerative brain disorders .  Exercise Daily Walk A daily 20 minute walk should be part of your routine. Disease related apathy can be a significant roadblock to exercise and the only way to overcome this is to make it a daily routine and perhaps have a reward at the end (something your loved one loves to eat or drink perhaps) or a personal trainer coming to the home can also be very useful. Most importantly, the patient is much more likely to exercise if the caregiver / spouse does it with him/her. In general a structured, repetitive schedule is best.  General Health: Any diseases which effect your body will effect your brain such as a pneumonia, urinary infection, blood clot, heart attack or stroke. Keep contact with your primary care doctor for regular follow ups.  Sleep. A good nights sleep is healthy for the brain. Seven hours is recommended. If you have insomnia or poor sleep habits we can give you some instructions. If you have sleep apnea wear your mask.  Diet: Eating a heart healthy diet is also a good idea; fish and poultry instead of red meat, nuts (mostly non-peanuts), vegetables, fruits, olive oil or canola oil (instead of butter), minimal salt (use other spices to flavor foods), whole grain rice, bread, cereal and pasta and wine in moderation.Research is now showing that the MIND diet, which is a combination of The Mediterranean diet and the DASH diet, is beneficial for cognitive processing and longevity. Information about this diet can be found in The MIND Diet, a book by Doyne Keel, MS, RDN, and online at [employer]bookDist[employer].si  Finances, Power of Attorney and Advance Directives: You  should consider putting legal safeguards in place with regard to financial and medical decision making. While the spouse always has power of attorney for medical and financial issues in the absence of any form, you should consider what you want in case the spouse / caregiver is no longer around or capable of making decisions.

## 2022-04-10 NOTE — Telephone Encounter (Signed)
The patient's spouse, Xariah Silvernail approached me prior to check out. He stated there were issues regarding the patient's memory he did not feel comfortable mentioning in front of her during the office visit. He is requesting a return call from Dr. April Manson.

## 2022-04-10 NOTE — Telephone Encounter (Signed)
Please advise husband if he can utilize MyChart to send me a detailed history of what has been going on, otherwise I will have to call him after hours.   Thanks

## 2022-04-11 DIAGNOSIS — S2221XD Fracture of manubrium, subsequent encounter for fracture with routine healing: Secondary | ICD-10-CM | POA: Diagnosis not present

## 2022-04-11 DIAGNOSIS — F39 Unspecified mood [affective] disorder: Secondary | ICD-10-CM | POA: Diagnosis not present

## 2022-04-11 DIAGNOSIS — F02B3 Dementia in other diseases classified elsewhere, moderate, with mood disturbance: Secondary | ICD-10-CM | POA: Diagnosis not present

## 2022-04-11 DIAGNOSIS — R5381 Other malaise: Secondary | ICD-10-CM | POA: Diagnosis not present

## 2022-04-11 DIAGNOSIS — F02B11 Dementia in other diseases classified elsewhere, moderate, with agitation: Secondary | ICD-10-CM | POA: Diagnosis not present

## 2022-04-11 DIAGNOSIS — R2689 Other abnormalities of gait and mobility: Secondary | ICD-10-CM | POA: Diagnosis not present

## 2022-04-11 DIAGNOSIS — R2681 Unsteadiness on feet: Secondary | ICD-10-CM | POA: Diagnosis not present

## 2022-04-11 DIAGNOSIS — G301 Alzheimer's disease with late onset: Secondary | ICD-10-CM | POA: Diagnosis not present

## 2022-04-11 DIAGNOSIS — M6281 Muscle weakness (generalized): Secondary | ICD-10-CM | POA: Diagnosis not present

## 2022-04-11 LAB — CBC
Hematocrit: 31.4 % — ABNORMAL LOW (ref 34.0–46.6)
Hemoglobin: 10.2 g/dL — ABNORMAL LOW (ref 11.1–15.9)
MCH: 29.4 pg (ref 26.6–33.0)
MCHC: 32.5 g/dL (ref 31.5–35.7)
MCV: 91 fL (ref 79–97)
Platelets: 231 10*3/uL (ref 150–450)
RBC: 3.47 x10E6/uL — ABNORMAL LOW (ref 3.77–5.28)
RDW: 13 % (ref 11.7–15.4)
WBC: 6.3 10*3/uL (ref 3.4–10.8)

## 2022-04-11 LAB — COMPREHENSIVE METABOLIC PANEL
ALT: 13 IU/L (ref 0–32)
AST: 24 IU/L (ref 0–40)
Albumin/Globulin Ratio: 1.7 (ref 1.2–2.2)
Albumin: 3.8 g/dL (ref 3.6–4.6)
Alkaline Phosphatase: 76 IU/L (ref 44–121)
BUN/Creatinine Ratio: 20 (ref 12–28)
BUN: 18 mg/dL (ref 10–36)
Bilirubin Total: <0.2 mg/dL (ref 0.0–1.2)
CO2: 26 mmol/L (ref 20–29)
Calcium: 9.6 mg/dL (ref 8.7–10.3)
Chloride: 103 mmol/L (ref 96–106)
Creatinine, Ser: 0.92 mg/dL (ref 0.57–1.00)
Globulin, Total: 2.3 g/dL (ref 1.5–4.5)
Glucose: 95 mg/dL (ref 70–99)
Potassium: 4.1 mmol/L (ref 3.5–5.2)
Sodium: 142 mmol/L (ref 134–144)
Total Protein: 6.1 g/dL (ref 6.0–8.5)
eGFR: 59 mL/min/{1.73_m2} — ABNORMAL LOW (ref >59)

## 2022-04-11 NOTE — Progress Notes (Signed)
Please call and advise the patient and her husband that the recent labs we checked showed that she is anemic. She needs to follow up with her doctor regarding the anemia and also to do an urinalysis to rule out urine infection.  Please remind patient to keep any upcoming appointments or tests and to call us with any interim questions, concerns, problems or updates. Thanks,   Alric Ran, MD

## 2022-04-14 ENCOUNTER — Telehealth: Payer: Self-pay

## 2022-04-14 DIAGNOSIS — S22000D Wedge compression fracture of unspecified thoracic vertebra, subsequent encounter for fracture with routine healing: Secondary | ICD-10-CM | POA: Diagnosis not present

## 2022-04-14 DIAGNOSIS — Z029 Encounter for administrative examinations, unspecified: Secondary | ICD-10-CM | POA: Diagnosis not present

## 2022-04-14 DIAGNOSIS — Z9181 History of falling: Secondary | ICD-10-CM | POA: Diagnosis not present

## 2022-04-14 DIAGNOSIS — F03B0 Unspecified dementia, moderate, without behavioral disturbance, psychotic disturbance, mood disturbance, and anxiety: Secondary | ICD-10-CM | POA: Diagnosis not present

## 2022-04-14 DIAGNOSIS — F411 Generalized anxiety disorder: Secondary | ICD-10-CM | POA: Diagnosis not present

## 2022-04-14 DIAGNOSIS — S2222XA Fracture of body of sternum, initial encounter for closed fracture: Secondary | ICD-10-CM | POA: Diagnosis not present

## 2022-04-14 DIAGNOSIS — M6281 Muscle weakness (generalized): Secondary | ICD-10-CM | POA: Diagnosis not present

## 2022-04-14 DIAGNOSIS — E860 Dehydration: Secondary | ICD-10-CM | POA: Diagnosis not present

## 2022-04-14 DIAGNOSIS — R52 Pain, unspecified: Secondary | ICD-10-CM | POA: Diagnosis not present

## 2022-04-14 DIAGNOSIS — R109 Unspecified abdominal pain: Secondary | ICD-10-CM | POA: Diagnosis not present

## 2022-04-14 DIAGNOSIS — S2221XD Fracture of manubrium, subsequent encounter for fracture with routine healing: Secondary | ICD-10-CM | POA: Diagnosis not present

## 2022-04-14 DIAGNOSIS — R2689 Other abnormalities of gait and mobility: Secondary | ICD-10-CM | POA: Diagnosis not present

## 2022-04-14 DIAGNOSIS — R2681 Unsteadiness on feet: Secondary | ICD-10-CM | POA: Diagnosis not present

## 2022-04-14 DIAGNOSIS — R112 Nausea with vomiting, unspecified: Secondary | ICD-10-CM | POA: Diagnosis not present

## 2022-04-14 DIAGNOSIS — R399 Unspecified symptoms and signs involving the genitourinary system: Secondary | ICD-10-CM | POA: Diagnosis not present

## 2022-04-14 DIAGNOSIS — S2222XD Fracture of body of sternum, subsequent encounter for fracture with routine healing: Secondary | ICD-10-CM | POA: Diagnosis not present

## 2022-04-14 NOTE — Telephone Encounter (Signed)
Patient's husband, [name], per DPR returned my call.  I discussed patient's lab results and recommendations.  Patient's husband will call Dr. Renold Genta regarding the anemia and also to do a urinalysis.  Patient's husband reports that they have not gotten a call about the Aricept yet.  I encouraged him to call [facility] on [address] to check the status.  Patient's husband verbalized understanding and appreciation.

## 2022-04-14 NOTE — Telephone Encounter (Signed)
-----   Message from Alric Ran, MD sent at 04/11/2022  8:00 AM EDT ----- Please call and advise the patient and her husband that the recent labs we checked showed that she is anemic. She needs to follow up with her doctor regarding the anemia and also to do an urinalysis to rule out urine infection.  Please remind patient to keep any upcoming appointments or tests and to call us with any interim questions, concerns, problems or updates. Thanks,   Alric Ran, MD

## 2022-04-14 NOTE — Telephone Encounter (Signed)
MyChart message sent to patient.

## 2022-04-14 NOTE — Telephone Encounter (Signed)
I sent a MyChart message requesting more information to be sent regarding the patient's history.

## 2022-04-14 NOTE — Telephone Encounter (Signed)
I called patient to discuss. No answer, left a message asking her to call me back. ?

## 2022-04-15 DIAGNOSIS — R2681 Unsteadiness on feet: Secondary | ICD-10-CM | POA: Diagnosis not present

## 2022-04-15 DIAGNOSIS — S22070D Wedge compression fracture of T9-T10 vertebra, subsequent encounter for fracture with routine healing: Secondary | ICD-10-CM | POA: Diagnosis not present

## 2022-04-15 DIAGNOSIS — F1120 Opioid dependence, uncomplicated: Secondary | ICD-10-CM | POA: Diagnosis not present

## 2022-04-15 DIAGNOSIS — S22000D Wedge compression fracture of unspecified thoracic vertebra, subsequent encounter for fracture with routine healing: Secondary | ICD-10-CM | POA: Diagnosis not present

## 2022-04-15 DIAGNOSIS — K5901 Slow transit constipation: Secondary | ICD-10-CM | POA: Diagnosis not present

## 2022-04-15 DIAGNOSIS — J449 Chronic obstructive pulmonary disease, unspecified: Secondary | ICD-10-CM | POA: Diagnosis not present

## 2022-04-15 DIAGNOSIS — S2221XD Fracture of manubrium, subsequent encounter for fracture with routine healing: Secondary | ICD-10-CM | POA: Diagnosis not present

## 2022-04-15 DIAGNOSIS — R2689 Other abnormalities of gait and mobility: Secondary | ICD-10-CM | POA: Diagnosis not present

## 2022-04-15 DIAGNOSIS — S2220XD Unspecified fracture of sternum, subsequent encounter for fracture with routine healing: Secondary | ICD-10-CM | POA: Diagnosis not present

## 2022-04-15 DIAGNOSIS — S52501D Unspecified fracture of the lower end of right radius, subsequent encounter for closed fracture with routine healing: Secondary | ICD-10-CM | POA: Diagnosis not present

## 2022-04-15 DIAGNOSIS — M6281 Muscle weakness (generalized): Secondary | ICD-10-CM | POA: Diagnosis not present

## 2022-04-15 DIAGNOSIS — J9610 Chronic respiratory failure, unspecified whether with hypoxia or hypercapnia: Secondary | ICD-10-CM | POA: Diagnosis not present

## 2022-04-15 DIAGNOSIS — R112 Nausea with vomiting, unspecified: Secondary | ICD-10-CM | POA: Diagnosis not present

## 2022-04-16 ENCOUNTER — Non-Acute Institutional Stay (SKILLED_NURSING_FACILITY): Payer: Medicare PPO | Admitting: Adult Health

## 2022-04-16 DIAGNOSIS — K219 Gastro-esophageal reflux disease without esophagitis: Secondary | ICD-10-CM

## 2022-04-16 DIAGNOSIS — F419 Anxiety disorder, unspecified: Secondary | ICD-10-CM

## 2022-04-16 DIAGNOSIS — T07XXXA Unspecified multiple injuries, initial encounter: Secondary | ICD-10-CM

## 2022-04-16 DIAGNOSIS — F5101 Primary insomnia: Secondary | ICD-10-CM

## 2022-04-16 DIAGNOSIS — J449 Chronic obstructive pulmonary disease, unspecified: Secondary | ICD-10-CM

## 2022-04-16 DIAGNOSIS — F32A Depression, unspecified: Secondary | ICD-10-CM | POA: Diagnosis not present

## 2022-04-16 MED ORDER — ALPRAZOLAM 0.25 MG PO TABS: 0.2500 mg | ORAL_TABLET | Freq: Every evening | ORAL | 0 refills | Status: DC | PRN

## 2022-04-16 NOTE — Progress Notes (Incomplete)
Location:  [place] Room Number: 63-A Place of Service:  SNF (31) Provider:  Durenda Age, DNP, FNP-BC  Patient Care Team: Elby Showers, MD as PCP - General (Internal Medicine) Burnell Blanks, MD as PCP - Cardiology (Cardiology) Collene Gobble, MD as Consulting Physician (Pulmonary Disease)  Extended Emergency Contact Information Primary Emergency Contact: Duve,[name] Address: [address]          [place], [place] [address] Johnnette Litter of [place] Phone: [phone] Mobile Phone: [phone] Relation: Spouse Secondary Emergency Contact: Sunday Shams Mobile Phone: [phone] Relation: Granddaughter  Code Status:  DNR  Goals of care: Advanced Directive information    02/12/2022    3:03 AM  Advanced Directives  Does Patient Have a Medical Advance Directive? Yes  Type of Advance Directive Out of facility DNR (pink MOST or yellow form)  Does patient want to make changes to medical advance directive? No - Patient declined     Chief Complaint  Patient presents with  . Acute Visit    Admission to [facility]    HPI:  Pt is a 88 y.o. female who was admitted to [facility] today, 04/16/22, from [place]. She has a PMH of COPD on supplemental oxygen, valvular disease s/p valve replacement, chronic pain, depression and history of multiple falls.  She was sent to the ED in July 2023 s/p fall sustaining sternal fracture.  She was discharged to [facility] but had another fall sustaining radditional fractures. Husband is at bedside. He is wanting patient to get rehabilitation due to weakness and poor oral intake.   Past Medical History:  Diagnosis Date  . Allergy   . Anemia   . Arthritis   . Asthma    patient denies  . B12 deficiency   . COPD (chronic obstructive pulmonary disease) ([place])   . Depression    not recent  . Diverticulosis   . Esophageal stricture   . Hepatic  cyst 01/30/2010  . Hiatal hernia 1985   patient unaware  . Hx of adenomatous colonic polyps 2006  . Hyperlipidemia   . IBS (irritable bowel syndrome)   . PONV (postoperative nausea and vomiting)    no nausea or vomitting after surgery 07/2014  . Positive PPD   . PUD (peptic ulcer disease)   . S/P TAVR (transcatheter aortic valve replacement) 08/13/2021   s/p TAVR with a 12m Edwards S3UR via the right carotid approach by Dr. MAngelena Form& Dr. BCyndia Bent . Severe aortic stenosis    Past Surgical History:  Procedure Laterality Date  . ABDOMINAL HYSTERECTOMY     abnormal bleeding  . APPENDECTOMY    . BARTHOLIN GLAND CYST EXCISION     x2  . BREAST BIOPSY Bilateral    Milk glnd  . CATARACT EXTRACTION W/ INTRAOCULAR LENS  IMPLANT, BILATERAL Bilateral   . COLONOSCOPY    . COLONOSCOPY W/ POLYPECTOMY    . HARDWARE REMOVAL N/A 12/13/2015   Procedure: Removal of HARDWARE  Lumbar Five-Sacral One ;  Surgeon: GKary Kos MD;  [facility];  Service: Neurosurgery;  Laterality: N/A;  . IR KYPHO LUMBAR INC FX REDUCE BONE BX UNI/BIL CANNULATION INC/IMAGING  07/13/2019  . PERICARDIAL WINDOW  08/13/2021   Procedure: PERICARDIAL WINDOW;  Surgeon: MBurnell Blanks MD;  [place] [facility]  Service: Open Heart Surgery;;  . RIGHT/LEFT HEART CATH AND CORONARY ANGIOGRAPHY N/A 07/11/2021   Procedure: RIGHT/LEFT HEART CATH AND CORONARY ANGIOGRAPHY;  Surgeon: Burnell Blanks, MD;  Location: [facility] CV LAB;  Service: Cardiovascular;  Laterality: N/A;  . TEE WITHOUT CARDIOVERSION N/A 08/13/2021   Procedure: TRANSESOPHAGEAL ECHOCARDIOGRAM (TEE);  Surgeon: Burnell Blanks, MD;  Location: [facility];  Service: Open Heart Surgery;  Laterality: N/A;  . TRANSCATHETER AORTIC VALVE REPLACEMENT, CAROTID Right 08/13/2021   Procedure: Transcatheter Aortic Valve Replacement-Carotid;  Surgeon: Burnell Blanks, MD;  Location: [facility];  Service: Open Heart Surgery;  Laterality: Right;  . UPPER GI  ENDOSCOPY    . VAGOTOMY AND PYLOROPLASTY     in [place], [place]    No Known Allergies  Outpatient Encounter Medications as of 04/16/2022  Medication Sig  . acetaminophen (TYLENOL) 325 MG tablet Take 650 mg by mouth as needed.  Marland Kitchen albuterol (VENTOLIN HFA) 108 (90 Base) MCG/ACT inhaler INHALE 2 PUFFS INTO THE LUNGS 4 (FOUR) TIMES DAILY AS NEEDED FOR WHEEZING OR SHORTNESS OF BREATH.  Marland Kitchen ALPRAZolam (XANAX) 0.25 MG tablet Take 1 tablet (0.25 mg total) by mouth at bedtime as needed for anxiety or sleep.  Marland Kitchen aspirin EC 81 MG tablet Take 81 mg by mouth daily. Swallow whole.  . bisacodyl (DULCOLAX) 10 MG suppository Place 1 suppository (10 mg total) rectally every 12 (twelve) hours as needed for moderate constipation or severe constipation.  . budesonide (PULMICORT) 0.5 MG/2ML nebulizer solution TAKE 2 MLS (0.5 MG TOTAL) BY NEBULIZATION 2 (TWO) TIMES DAILY.  Marland Kitchen Cyanocobalamin (VITAMIN B-12 PO) Take 1 tablet by mouth daily.  . diclofenac Sodium (VOLTAREN) 1 % GEL Apply 4 g topically 4 (four) times daily.  Marland Kitchen docusate sodium (COLACE) 100 MG capsule Take 1 capsule (100 mg total) by mouth 2 (two) times daily.  Marland Kitchen donepezil (ARICEPT) 5 MG tablet Take 1 tablet (5 mg total) by mouth at bedtime.  Marland Kitchen escitalopram (LEXAPRO) 5 MG tablet TAKE 1 TABLET BY MOUTH EVERY DAY  . fluticasone (FLONASE) 50 MCG/ACT nasal spray Place 1 spray into both nostrils daily.  Marland Kitchen gabapentin (NEURONTIN) 100 MG capsule Take 1 capsule (100 mg total) by mouth 3 (three) times daily.  . hydrOXYzine (ATARAX) 10 MG tablet Take 10 mg by mouth daily as needed.  Marland Kitchen ipratropium (ATROVENT) 0.02 % nebulizer solution TAKE 2.5 MLS (0.5 MG TOTAL) BY NEBULIZATION 4 (FOUR) TIMES DAILY.  Marland Kitchen lidocaine (HM LIDOCAINE PATCH) 4 % Place 1 patch onto the skin daily.  . Lidocaine (SALONPAS PAIN RELIEVING EX) Apply 1 Application topically 2 (two) times daily as needed (pain).  . Melatonin 10 MG CAPS Take 10 mg by mouth at bedtime.  . Multiple Vitamins-Minerals  (MULTIVITAMIN WITH MINERALS) tablet Take 1 tablet by mouth daily.  . ondansetron (ZOFRAN) 4 MG/5ML solution Take by mouth once.  Marland Kitchen oxyCODONE (OXY IR/ROXICODONE) 5 MG immediate release tablet Take 1 tablet (5 mg total) by mouth every 6 (six) hours as needed for severe pain or breakthrough pain.  . pantoprazole (PROTONIX) 40 MG tablet TAKE 1 TABLET BY MOUTH EVERY DAY  . polyethylene glycol (MIRALAX / GLYCOLAX) 17 g packet Take 17 g by mouth daily.  . traMADol-acetaminophen (ULTRACET) 37.5-325 MG tablet Take by mouth.  . traZODone (DESYREL) 50 MG tablet TAKE 3 TABLETS BY MOUTH AT BEDTIME  . [DISCONTINUED] ALPRAZolam (XANAX) 0.25 MG tablet Take 1 tablet (0.25 mg total) by mouth at bedtime as needed for anxiety or sleep.   No facility-administered encounter medications on file as of 04/16/2022.    Review of Systems  Constitutional:  Negative for appetite change, chills, fatigue and fever.  HENT:  Negative for congestion, hearing loss, rhinorrhea and sore throat.   Eyes: Negative.   Respiratory:  Negative for cough, shortness of breath and wheezing.   Cardiovascular:  Negative for chest pain, palpitations and leg swelling.  Gastrointestinal:  Negative for abdominal pain, constipation, diarrhea, nausea and vomiting.  Genitourinary:  Negative for dysuria.  Musculoskeletal:  Negative for arthralgias, back pain and myalgias.  Skin:  Negative for color change, rash and wound.  Neurological:  Negative for dizziness, weakness and headaches.  Psychiatric/Behavioral:  Negative for behavioral problems. The patient is not nervous/anxious.       Immunization History  Administered Date(s) Administered  . Fluad Quad(high Dose 65+) 03/18/2020  . Influenza Split 02/20/2018  . Influenza Whole 01/29/2010  . Influenza, High Dose Seasonal PF 04/13/2014, 02/12/2016, 03/10/2017, 03/19/2018, 04/11/2019  . Influenza-Unspecified 03/16/2014, 03/05/2021  . PFIZER(Purple Top)SARS-COV-2 Vaccination 08/08/2019,  08/29/2019, 03/18/2020, 02/26/2021  . Pneumococcal Conjugate-13 05/12/2016  . Pneumococcal Polysaccharide-23 08/08/1996  . Tdap 04/10/2003, 02/12/2022  . Zoster Recombinat (Shingrix) 02/22/2019   Pertinent  Health Maintenance Due  Topic Date Due  . INFLUENZA VACCINE  01/14/2022  . DEXA SCAN  Completed      02/02/2022   10:30 PM 02/03/2022    8:30 AM 02/03/2022    8:00 PM 02/04/2022    8:00 AM 02/12/2022    3:03 AM  Fall Risk  Patient Fall Risk Level High fall risk High fall risk High fall risk High fall risk High fall risk     There were no vitals filed for this visit. There is no height or weight on file to calculate BMI.  Physical Exam Constitutional:      Appearance: Normal appearance.  HENT:     Head: Normocephalic and atraumatic.     Nose: Nose normal.     Mouth/Throat:     Mouth: Mucous membranes are moist.  Eyes:     Conjunctiva/sclera: Conjunctivae normal.  Cardiovascular:     Rate and Rhythm: Normal rate and regular rhythm.  Pulmonary:     Effort: Pulmonary effort is normal.     Breath sounds: Normal breath sounds.  Abdominal:     General: Bowel sounds are normal.     Palpations: Abdomen is soft.  Musculoskeletal:        General: Normal range of motion.     Cervical back: Normal range of motion.  Skin:    General: Skin is warm and dry.  Neurological:     General: No focal deficit present.     Mental Status: She is alert and oriented to person, place, and time.  Psychiatric:        Mood and Affect: Mood normal.        Behavior: Behavior normal.        Thought Content: Thought content normal.        Judgment: Judgment normal.        Labs reviewed: Recent Labs    10/12/21 0318 10/24/21 0023 01/29/22 2306 01/30/22 1610 01/31/22 2244 02/01/22 0237 02/02/22 0801 04/10/22 1410  NA 142   < > 140   < > 137  --  136 142  K 3.6   < > 2.9*   < > 4.1  --  3.9 4.1  CL 109   < > 100   < > 102  --  105 103  CO2 28   < > 31   < > 28  --  22 26  GLUCOSE  87   < >  128*   < > 80  --  80 95  BUN 14   < > 17   < > 19  --  19 18  CREATININE 0.95   < > 1.11*   < > 1.30*  --  1.10* 0.92  CALCIUM 8.8*   < > 9.1   < > 8.9  --  8.6* 9.6  MG 2.3  --  1.7  --   --  2.1  --   --   PHOS  --   --  2.4*  --   --  4.1  --   --    < > = values in this interval not displayed.   Recent Labs    10/12/21 0318 11/28/21 1106 01/08/22 1521 04/10/22 1410  AST '26 22 26 24  '$ ALT '15 10 14 13  '$ ALKPHOS 42  --  49 76  BILITOT 0.7 0.3 0.3 <0.2  PROT 5.5* 6.5 5.7* 6.1  ALBUMIN 3.0*  --  3.0* 3.8   Recent Labs    01/08/22 1521 01/29/22 2306 01/30/22 1610 02/01/22 0237 02/02/22 0801 04/10/22 1410  WBC 7.3 10.3   < > 7.0 6.9 6.3  NEUTROABS 5.1 8.6*  --  4.2  --   --   HGB 9.7* 10.9*   < > 10.3* 10.0* 10.2*  HCT 30.6* 34.4*   < > 32.1* 32.4* 31.4*  MCV 93.6 92.2   < > 92.8 94.2 91  PLT 171 169   < > 180 187 231   < > = values in this interval not displayed.   Lab Results  Component Value Date   TSH 2.88 11/28/2021   Lab Results  Component Value Date   HGBA1C 5.2 07/09/2017   Lab Results  Component Value Date   CHOL 206 (H) 11/28/2021   HDL 68 11/28/2021   LDLCALC 115 (H) 11/28/2021   TRIG 124 11/28/2021   CHOLHDL 3.0 11/28/2021    Significant Diagnostic Results in last 30 days:  No results found.  Assessment/Plan      Family/ staff Communication: Discussed plan of care with resident and charge nurse  Labs/tests ordered:     Durenda Age, DNP, MSN, FNP-BC [facility] [phone] (Monday-Friday 8:00 a.m. - 5:00 p.m.) [phone] (after hours)

## 2022-04-16 NOTE — Progress Notes (Unsigned)
Location:  [place] Room Number: 63-A Place of Service:  SNF (31) Provider:  Durenda Age, DNP, FNP-BC  Patient Care Team: Elby Showers, MD as PCP - General (Internal Medicine) Burnell Blanks, MD as PCP - Cardiology (Cardiology) Collene Gobble, MD as Consulting Physician (Pulmonary Disease)  Extended Emergency Contact Information Primary Emergency Contact: Ormand,Katherine [name] Address: [address]          [place], [place] [address] Katherine Katherine [name] Litter of [place] Phone: [phone] Mobile Phone: [phone] Relation: Spouse Secondary Emergency Contact: Sunday Shams Mobile Phone: [phone] Relation: Granddaughter  Code Status:  DNR  Goals of care: Advanced Directive information    02/12/2022    3:03 AM  Advanced Directives  Does Patient Have a Medical Advance Directive? Yes  Type of Advance Directive Out of facility DNR (pink MOST or yellow form)  Does patient want to make changes to medical advance directive? No - Patient declined     Chief Complaint  Patient presents with   Acute Visit    Admission to [facility]    HPI:  Pt is a 88 y.o. Katherine [name] who was admitted to [facility] today, 04/16/22, from [place]. She has a PMH of COPD on supplemental oxygen, valvular disease s/p valve replacement, chronic pain, depression and history of multiple falls.  She was sent to the ED in July 2023 s/p fall sustaining sternal fracture.  She was discharged to [facility] but had another fall sustaining additional fractures. Husband is at bedside. He is wanting patient to get rehabilitation due to weakness and poor oral intake.   Past Medical History:  Diagnosis Date   Allergy    Anemia    Arthritis    Asthma    patient denies   B12 deficiency    COPD (chronic obstructive pulmonary disease) ([place])    Depression    not recent   Diverticulosis    Esophageal stricture    Hepatic cyst  01/30/2010   Hiatal hernia 1985   patient unaware   Hx of adenomatous colonic polyps 2006   Hyperlipidemia    IBS (irritable bowel syndrome)    PONV (postoperative nausea and vomiting)    no nausea or vomitting after surgery 07/2014   Positive PPD    PUD (peptic ulcer disease)    S/P TAVR (transcatheter aortic valve replacement) 08/13/2021   s/p TAVR with a 40m Edwards S3UR via the right carotid approach by Dr. MAngelena Form& Dr. BCyndia Bent  Severe aortic stenosis    Past Surgical History:  Procedure Laterality Date   ABDOMINAL HYSTERECTOMY     abnormal bleeding   APPENDECTOMY     BARTHOLIN GLAND CYST EXCISION     x2   BREAST BIOPSY Bilateral    Milk glnd   CATARACT EXTRACTION W/ INTRAOCULAR LENS  IMPLANT, BILATERAL Bilateral    COLONOSCOPY     COLONOSCOPY W/ POLYPECTOMY     HARDWARE REMOVAL N/A 12/13/2015   Procedure: Removal of HARDWARE  Lumbar Five-Sacral One ;  Surgeon: GKary Kos MD;  [facility];  Service: Neurosurgery;  Laterality: N/A;   IR KYPHO LUMBAR INC FX REDUCE BONE BX UNI/BIL CANNULATION INC/IMAGING  07/13/2019   PERICARDIAL WINDOW  08/13/2021   Procedure: PERICARDIAL WINDOW;  Surgeon: MBurnell Blanks MD;  [facility]  Service: Open Heart Surgery;;   RIGHT/LEFT HEART CATH AND CORONARY ANGIOGRAPHY N/A 07/11/2021   Procedure: RIGHT/LEFT HEART CATH AND CORONARY ANGIOGRAPHY;  Surgeon: Burnell Blanks, MD;  Location: [facility] CV LAB;  Service: Cardiovascular;  Laterality: N/A;   TEE WITHOUT CARDIOVERSION N/A 08/13/2021   Procedure: TRANSESOPHAGEAL ECHOCARDIOGRAM (TEE);  Surgeon: Burnell Blanks, MD;  Location: [facility];  Service: Open Heart Surgery;  Laterality: N/A;   TRANSCATHETER AORTIC VALVE REPLACEMENT, CAROTID Right 08/13/2021   Procedure: Transcatheter Aortic Valve Replacement-Carotid;  Surgeon: Burnell Blanks, MD;  Location: [facility];  Service: Open Heart Surgery;  Laterality: Right;   UPPER GI ENDOSCOPY     VAGOTOMY AND  PYLOROPLASTY     in [place], [place]    No Known Allergies  Outpatient Encounter Medications as of 04/16/2022  Medication Sig   acetaminophen (TYLENOL) 325 MG tablet Take 650 mg by mouth as needed.   albuterol (VENTOLIN HFA) 108 (90 Base) MCG/ACT inhaler INHALE 2 PUFFS INTO THE LUNGS 4 (FOUR) TIMES DAILY AS NEEDED FOR WHEEZING OR SHORTNESS OF BREATH.   ALPRAZolam (XANAX) 0.25 MG tablet Take 1 tablet (0.25 mg total) by mouth at bedtime as needed for anxiety or sleep.   aspirin EC 81 MG tablet Take 81 mg by mouth daily. Swallow whole.   bisacodyl (DULCOLAX) 10 MG suppository Place 1 suppository (10 mg total) rectally every 12 (twelve) hours as needed for moderate constipation or severe constipation.   budesonide (PULMICORT) 0.5 MG/2ML nebulizer solution TAKE 2 MLS (0.5 MG TOTAL) BY NEBULIZATION 2 (TWO) TIMES DAILY.   Cyanocobalamin (VITAMIN B-12 PO) Take 1 tablet by mouth daily.   diclofenac Sodium (VOLTAREN) 1 % GEL Apply 4 g topically 4 (four) times daily.   docusate sodium (COLACE) 100 MG capsule Take 1 capsule (100 mg total) by mouth 2 (two) times daily.   donepezil (ARICEPT) 5 MG tablet Take 1 tablet (5 mg total) by mouth at bedtime.   escitalopram (LEXAPRO) 5 MG tablet TAKE 1 TABLET BY MOUTH EVERY DAY   fluticasone (FLONASE) 50 MCG/ACT nasal spray Place 1 spray into both nostrils daily.   gabapentin (NEURONTIN) 100 MG capsule Take 1 capsule (100 mg total) by mouth 3 (three) times daily.   ipratropium (ATROVENT) 0.02 % nebulizer solution TAKE 2.5 MLS (0.5 MG TOTAL) BY NEBULIZATION 4 (FOUR) TIMES DAILY.   lidocaine (HM LIDOCAINE PATCH) 4 % Place 1 patch onto the skin daily.   Lidocaine (SALONPAS PAIN RELIEVING EX) Apply 1 Application topically 2 (two) times daily as needed (pain).   Melatonin 10 MG CAPS Take 10 mg by mouth at bedtime.   Multiple Vitamins-Minerals (MULTIVITAMIN WITH MINERALS) tablet Take 1 tablet by mouth daily.   ondansetron (ZOFRAN) 4 MG/5ML solution Take by mouth once.    pantoprazole (PROTONIX) 40 MG tablet TAKE 1 TABLET BY MOUTH EVERY DAY   polyethylene glycol (MIRALAX / GLYCOLAX) 17 g packet Take 17 g by mouth daily.   traMADol-acetaminophen (ULTRACET) 37.5-325 MG tablet Take by mouth.   traZODone (DESYREL) 50 MG tablet TAKE 3 TABLETS BY MOUTH AT BEDTIME   [DISCONTINUED] ALPRAZolam (XANAX) 0.25 MG tablet Take 1 tablet (0.25 mg total) by mouth at bedtime as needed for anxiety or sleep.   [DISCONTINUED] hydrOXYzine (ATARAX) 10 MG tablet Take 10 mg by mouth daily as needed.   [DISCONTINUED] oxyCODONE (OXY IR/ROXICODONE) 5 MG immediate release tablet Take 1 tablet (5 mg total) by mouth every 6 (six) hours as needed for severe pain or breakthrough pain.   No facility-administered encounter medications on file as of 04/16/2022.    Review of Systems  Constitutional:  Negative for appetite change, chills, fatigue  and fever.  HENT:  Negative for congestion, hearing loss, rhinorrhea and sore throat.   Eyes: Negative.   Respiratory:  Negative for cough, shortness of breath and wheezing.   Cardiovascular:  Negative for chest pain, palpitations and leg swelling.  Gastrointestinal:  Negative for abdominal pain, constipation, diarrhea, nausea and vomiting.  Genitourinary:  Negative for dysuria.  Musculoskeletal:  Negative for arthralgias, back pain and myalgias.  Skin:  Negative for color change, rash and wound.  Neurological:  Positive for weakness. Negative for dizziness and headaches.  Psychiatric/Behavioral:  Negative for behavioral problems. The patient is not nervous/anxious.       Immunization History  Administered Date(s) Administered   Fluad Quad(high Dose 65+) 03/18/2020   Influenza Split 02/20/2018   Influenza Whole 01/29/2010   Influenza, High Dose Seasonal PF 04/13/2014, 02/12/2016, 03/10/2017, 03/19/2018, 04/11/2019   Influenza-Unspecified 03/16/2014, 03/05/2021   PFIZER(Purple Top)SARS-COV-2 Vaccination 08/08/2019, 08/29/2019, 03/18/2020, 02/26/2021    Pneumococcal Conjugate-13 05/12/2016   Pneumococcal Polysaccharide-23 08/08/1996   Tdap 04/10/2003, 02/12/2022   Zoster Recombinat (Shingrix) 02/22/2019   Pertinent  Health Maintenance Due  Topic Date Due   INFLUENZA VACCINE  01/14/2022   DEXA SCAN  Completed      02/02/2022   10:30 PM 02/03/2022    8:30 AM 02/03/2022    8:00 PM 02/04/2022    8:00 AM 02/12/2022    3:03 AM  Fall Risk  Patient Fall Risk Level High fall risk High fall risk High fall risk High fall risk High fall risk     Vitals:   04/16/22 1600  BP: (!) 140/80  Pulse: 78  Resp: 18  Temp: 98 F (36.7 C)  SpO2: 98%   There is no height or weight on file to calculate BMI.  Physical Exam Constitutional:      General: She is not in acute distress. HENT:     Head: Normocephalic and atraumatic.     Nose: Nose normal.     Mouth/Throat:     Mouth: Mucous membranes are moist.  Eyes:     Conjunctiva/sclera: Conjunctivae normal.  Cardiovascular:     Rate and Rhythm: Normal rate and regular rhythm.  Pulmonary:     Effort: Pulmonary effort is normal.     Breath sounds: Normal breath sounds.  Abdominal:     General: Bowel sounds are normal.     Palpations: Abdomen is soft.  Musculoskeletal:        General: Normal range of motion.     Cervical back: Normal range of motion.     Comments: Right wrist with brace  Skin:    General: Skin is warm and dry.  Neurological:     Mental Status: She is alert. Mental status is at baseline.  Psychiatric:        Mood and Affect: Mood normal.        Behavior: Behavior normal.        Labs reviewed: Recent Labs    10/12/21 0318 10/24/21 0023 01/29/22 2306 01/30/22 1610 01/31/22 2244 02/01/22 0237 02/02/22 0801 04/10/22 1410  NA 142   < > 140   < > 137  --  136 142  K 3.6   < > 2.9*   < > 4.1  --  3.9 4.1  CL 109   < > 100   < > 102  --  105 103  CO2 28   < > 31   < > 28  --  22 26  GLUCOSE 87   < >  128*   < > 80  --  80 95  BUN 14   < > 17   < > 19  --  19  18  CREATININE 0.95   < > 1.11*   < > 1.30*  --  1.10* 0.92  CALCIUM 8.8*   < > 9.1   < > 8.9  --  8.6* 9.6  MG 2.3  --  1.7  --   --  2.1  --   --   PHOS  --   --  2.4*  --   --  4.1  --   --    < > = values in this interval not displayed.   Recent Labs    10/12/21 0318 11/28/21 1106 01/08/22 1521 04/10/22 1410  AST '26 22 26 24  '$ ALT '15 10 14 13  '$ ALKPHOS 42  --  Katherine 76  BILITOT 0.7 0.3 0.3 <0.2  PROT 5.5* 6.5 5.7* 6.1  ALBUMIN 3.0*  --  3.0* 3.8   Recent Labs    01/08/22 1521 01/29/22 2306 01/30/22 1610 02/01/22 0237 02/02/22 0801 04/10/22 1410  WBC 7.3 10.3   < > 7.0 6.9 6.3  NEUTROABS 5.1 8.6*  --  4.2  --   --   HGB 9.7* 10.9*   < > 10.3* 10.0* 10.2*  HCT 30.6* 34.4*   < > 32.1* 32.4* 31.4*  MCV 93.6 92.2   < > 92.8 94.2 91  PLT 171 169   < > 180 187 231   < > = values in this interval not displayed.   Lab Results  Component Value Date   TSH 2.88 11/28/2021   Lab Results  Component Value Date   HGBA1C 5.2 07/09/2017   Lab Results  Component Value Date   CHOL 206 (H) 11/28/2021   HDL 68 11/28/2021   LDLCALC 115 (H) 11/28/2021   TRIG 124 11/28/2021   CHOLHDL 3.0 11/28/2021    Significant Diagnostic Results in last 30 days:  No results found.  Assessment/Plan  1. Multiple fractures -  has fracture of distal ends of right radius and ulna, sternum fracture, T8, T9 and T12 fracture and compression fracture of T7 -   No surgical intervention was recommended per neurosurgery -   For PT and OT, for therapeutic strengthening exercises -Continue Ultracet for pain -  fall precautions  2. COPD, severe ([place]) -  no wheezing, continue O2 -   Continue ipratropium bromide neb and albuterol PRN  3. Anxiety - ALPRAZolam (XANAX) 0.25 MG tablet; Take 1 tablet (0.25 mg total) by mouth at bedtime as needed for anxiety or sleep.  Dispense: 30 tablet; Refill: 0  4. Depression, unspecified depression type -  continue Lexapro  5. Gastroesophageal reflux disease  without esophagitis -  stable, continue Pantoprazole  6. Primary insomnia -  continue Trazodone     Family/ staff Communication: Discussed plan of care with resident, husband and charge nurse.  Labs/tests ordered:  None    Durenda Age, DNP, MSN, FNP-BC [facility] [phone] (Monday-Friday 8:00 a.m. - 5:00 p.m.) [phone] (after hours)

## 2022-04-17 ENCOUNTER — Encounter: Payer: Self-pay | Admitting: Adult Health

## 2022-04-19 DIAGNOSIS — J449 Chronic obstructive pulmonary disease, unspecified: Secondary | ICD-10-CM | POA: Diagnosis not present

## 2022-04-21 ENCOUNTER — Telehealth: Payer: Self-pay | Admitting: Neurology

## 2022-04-21 ENCOUNTER — Other Ambulatory Visit: Payer: Self-pay | Admitting: Neurology

## 2022-04-21 DIAGNOSIS — R41841 Cognitive communication deficit: Secondary | ICD-10-CM | POA: Diagnosis not present

## 2022-04-21 DIAGNOSIS — R278 Other lack of coordination: Secondary | ICD-10-CM | POA: Diagnosis not present

## 2022-04-21 DIAGNOSIS — R1312 Dysphagia, oropharyngeal phase: Secondary | ICD-10-CM | POA: Diagnosis not present

## 2022-04-21 DIAGNOSIS — M6281 Muscle weakness (generalized): Secondary | ICD-10-CM | POA: Diagnosis not present

## 2022-04-21 DIAGNOSIS — R2689 Other abnormalities of gait and mobility: Secondary | ICD-10-CM | POA: Diagnosis not present

## 2022-04-21 MED ORDER — ESCITALOPRAM OXALATE 20 MG PO TABS: 20.0000 mg | ORAL_TABLET | Freq: Every day | ORAL | 11 refills | Status: DC

## 2022-04-21 NOTE — Telephone Encounter (Signed)
I will fax Aricept '5mg'$  and Lexapro '20mg'$  to [facility] at [phone]. Confirmation received.

## 2022-04-21 NOTE — Telephone Encounter (Signed)
[facility] is wanting to know if there have been changes in pt's donepezil (ARICEPT) 5 MG tablet and escitalopram (LEXAPRO) 5 MG tablet.  RN from POD relayed plan from most recent office visit.  [facility] is asking if the Dr order to them @ [phone]

## 2022-04-21 NOTE — Progress Notes (Signed)
Rx for Lexapro '20mg'$  sent.

## 2022-04-22 ENCOUNTER — Non-Acute Institutional Stay (SKILLED_NURSING_FACILITY): Payer: Medicare PPO | Admitting: Family Medicine

## 2022-04-22 DIAGNOSIS — M545 Low back pain, unspecified: Secondary | ICD-10-CM | POA: Diagnosis not present

## 2022-04-22 DIAGNOSIS — E441 Mild protein-calorie malnutrition: Secondary | ICD-10-CM | POA: Diagnosis not present

## 2022-04-22 DIAGNOSIS — J449 Chronic obstructive pulmonary disease, unspecified: Secondary | ICD-10-CM

## 2022-04-22 DIAGNOSIS — G8929 Other chronic pain: Secondary | ICD-10-CM | POA: Diagnosis not present

## 2022-04-22 DIAGNOSIS — E440 Moderate protein-calorie malnutrition: Secondary | ICD-10-CM

## 2022-04-22 DIAGNOSIS — R41841 Cognitive communication deficit: Secondary | ICD-10-CM | POA: Diagnosis not present

## 2022-04-22 DIAGNOSIS — R1312 Dysphagia, oropharyngeal phase: Secondary | ICD-10-CM | POA: Diagnosis not present

## 2022-04-22 DIAGNOSIS — J9611 Chronic respiratory failure with hypoxia: Secondary | ICD-10-CM

## 2022-04-22 DIAGNOSIS — M6281 Muscle weakness (generalized): Secondary | ICD-10-CM | POA: Diagnosis not present

## 2022-04-22 DIAGNOSIS — F32A Depression, unspecified: Secondary | ICD-10-CM

## 2022-04-22 DIAGNOSIS — R278 Other lack of coordination: Secondary | ICD-10-CM | POA: Diagnosis not present

## 2022-04-22 DIAGNOSIS — R2689 Other abnormalities of gait and mobility: Secondary | ICD-10-CM | POA: Diagnosis not present

## 2022-04-22 NOTE — Progress Notes (Signed)
Provider:  Alain Honey, MD Location:      Place of Service:     PCP: Elby Showers, MD Patient Care Team: Elby Showers, MD as PCP - General (Internal Medicine) Burnell Blanks, MD as PCP - Cardiology (Cardiology) Collene Gobble, MD as Consulting Physician (Pulmonary Disease)  Extended Emergency Contact Information Primary Emergency Contact: Vogan,[name] Address: [address]          [place], [place] [address] Johnnette Litter of [place] Phone: [phone] Mobile Phone: [phone] Relation: Spouse Secondary Emergency Contact: Sunday Shams Mobile Phone: [phone] Relation: Granddaughter  Code Status:  Goals of Care: Advanced Directive information    02/12/2022    3:03 AM  Advanced Directives  Does Patient Have a Medical Advance Directive? Yes  Type of Advance Directive Out of facility DNR (pink MOST or yellow form)  Does patient want to make changes to medical advance directive? No - Patient declined     Prior to No chief complaint on file.   HPI: Patient is a 88 y.o. female seen today for admission to skilled nursing facility [facility]  She has a history of COPD on supplemental oxygen, valvular heart disease and is status post valve replacement, depression, and history of multiple falls.  She had previously been at [facility] and rehab prior to her admission here She had been living with her husband here in [place] and I understand plan is for him to come into assisted living here.  She is ostensibly being admitted for rehab due to increasing weakness as well as poor oral intake and weight loss. She does have chronic pain but that is managed with tramadol and Tylenol combo.  Past Medical History:  Diagnosis Date   Allergy    Anemia    Arthritis    Asthma    patient denies   B12 deficiency    COPD (chronic obstructive pulmonary disease) ([place])    Depression    not recent   Diverticulosis    Esophageal stricture     Hepatic cyst 01/30/2010   Hiatal hernia 1985   patient unaware   Hx of adenomatous colonic polyps 2006   Hyperlipidemia    IBS (irritable bowel syndrome)    PONV (postoperative nausea and vomiting)    no nausea or vomitting after surgery 07/2014   Positive PPD    PUD (peptic ulcer disease)    S/P TAVR (transcatheter aortic valve replacement) 08/13/2021   s/p TAVR with a 17m Edwards S3UR via the right carotid approach by Dr. MAngelena Form& Dr. BCyndia Bent  Severe aortic stenosis    Past Surgical History:  Procedure Laterality Date   ABDOMINAL HYSTERECTOMY     abnormal bleeding   APPENDECTOMY     BARTHOLIN GLAND CYST EXCISION     x2   BREAST BIOPSY Bilateral    Milk glnd   CATARACT EXTRACTION W/ INTRAOCULAR LENS  IMPLANT, BILATERAL Bilateral    COLONOSCOPY     COLONOSCOPY W/ POLYPECTOMY     HARDWARE REMOVAL N/A 12/13/2015   Procedure: Removal of HARDWARE  Lumbar Five-Sacral One ;  Surgeon: GKary Kos MD;  Location: [facility];  Service: Neurosurgery;  Laterality: N/A;   IR KYPHO LUMBAR INC FX REDUCE BONE BX UNI/BIL CANNULATION INC/IMAGING  07/13/2019   PERICARDIAL WINDOW  08/13/2021   Procedure: PERICARDIAL WINDOW;  Surgeon: MBurnell Blanks MD;  Location: [facility]  Service: Open Heart Surgery;;   RIGHT/LEFT HEART CATH AND CORONARY ANGIOGRAPHY N/A 07/11/2021  Procedure: RIGHT/LEFT HEART CATH AND CORONARY ANGIOGRAPHY;  Surgeon: Burnell Blanks, MD;  Location: [facility];  Service: Cardiovascular;  Laterality: N/A;   TEE WITHOUT CARDIOVERSION N/A 08/13/2021   Procedure: TRANSESOPHAGEAL ECHOCARDIOGRAM (TEE);  Surgeon: Burnell Blanks, MD;  Location: [facility];  Service: Open Heart Surgery;  Laterality: N/A;   TRANSCATHETER AORTIC VALVE REPLACEMENT, CAROTID Right 08/13/2021   Procedure: Transcatheter Aortic Valve Replacement-Carotid;  Surgeon: Burnell Blanks, MD;  Location: [facility];  Service: Open Heart Surgery;  Laterality: Right;   UPPER GI ENDOSCOPY      VAGOTOMY AND PYLOROPLASTY     in [place], [place]    reports that she quit smoking about 12 years ago. Her smoking use included cigarettes. She has never used smokeless tobacco. She reports that she does not drink alcohol and does not use drugs. Social History   Socioeconomic History   Marital status: Married    Spouse name: Not on file   Number of children: 2   Years of education: Not on file   Highest education level: Not on file  Occupational History    Employer: RETIRED   Occupation: Retired-office work  Tobacco Use   Smoking status: Former    Years: 60.00    Types: Cigarettes    Quit date: 02/02/2010    Years since quitting: 12.2   Smokeless tobacco: Never  Vaping Use   Vaping Use: Never used  Substance and Sexual Activity   Alcohol use: No    Comment: quit in 1983   Drug use: No   Sexual activity: Not Currently  Other Topics Concern   Not on file  Social History Narrative   Daily caffeine    Social Determinants of Health   Financial Resource Strain: Not on file  Food Insecurity: Not on file  Transportation Needs: Not on file  Physical Activity: Not on file  Stress: Not on file  Social Connections: Not on file  Intimate Partner Violence: Not on file    Functional Status Survey:    Family History  Problem Relation Age of Onset   Ulcers Father    Stroke Father    Irritable bowel syndrome Father    Ovarian cancer Sister    Colon cancer Neg Hx     Health Maintenance  Topic Date Due   Zoster Vaccines- Shingrix (2 of 2) 04/19/2019   COVID-19 Vaccine (5 - Pfizer series) 04/23/2021   INFLUENZA VACCINE  01/14/2022   Medicare Annual Wellness (AWV)  12/04/2022   TETANUS/TDAP  02/13/2032   Pneumonia Vaccine 53+ Years old  Completed   DEXA SCAN  Completed   HPV VACCINES  Aged Out    No Known Allergies  Outpatient Encounter Medications as of 04/22/2022  Medication Sig   acetaminophen (TYLENOL) 325 MG tablet Take 650 mg by mouth as needed.   albuterol  (VENTOLIN HFA) 108 (90 Base) MCG/ACT inhaler INHALE 2 PUFFS INTO THE LUNGS 4 (FOUR) TIMES DAILY AS NEEDED FOR WHEEZING OR SHORTNESS OF BREATH.   ALPRAZolam (XANAX) 0.25 MG tablet Take 1 tablet (0.25 mg total) by mouth at bedtime as needed for anxiety or sleep.   aspirin EC 81 MG tablet Take 81 mg by mouth daily. Swallow whole.   bisacodyl (DULCOLAX) 10 MG suppository Place 1 suppository (10 mg total) rectally every 12 (twelve) hours as needed for moderate constipation or severe constipation.   budesonide (PULMICORT) 0.5 MG/2ML nebulizer solution TAKE 2 MLS (0.5 MG TOTAL) BY NEBULIZATION 2 (TWO) TIMES DAILY.  Cyanocobalamin (VITAMIN B-12 PO) Take 1 tablet by mouth daily.   diclofenac Sodium (VOLTAREN) 1 % GEL Apply 4 g topically 4 (four) times daily.   docusate sodium (COLACE) 100 MG capsule Take 1 capsule (100 mg total) by mouth 2 (two) times daily.   donepezil (ARICEPT) 5 MG tablet Take 1 tablet (5 mg total) by mouth at bedtime.   escitalopram (LEXAPRO) 20 MG tablet Take 1 tablet (20 mg total) by mouth daily.   fluticasone (FLONASE) 50 MCG/ACT nasal spray Place 1 spray into both nostrils daily.   gabapentin (NEURONTIN) 100 MG capsule Take 1 capsule (100 mg total) by mouth 3 (three) times daily.   ipratropium (ATROVENT) 0.02 % nebulizer solution TAKE 2.5 MLS (0.5 MG TOTAL) BY NEBULIZATION 4 (FOUR) TIMES DAILY.   lidocaine (HM LIDOCAINE PATCH) 4 % Place 1 patch onto the skin daily.   Lidocaine (SALONPAS PAIN RELIEVING EX) Apply 1 Application topically 2 (two) times daily as needed (pain).   Melatonin 10 MG CAPS Take 10 mg by mouth at bedtime.   Multiple Vitamins-Minerals (MULTIVITAMIN WITH MINERALS) tablet Take 1 tablet by mouth daily.   ondansetron (ZOFRAN) 4 MG/5ML solution Take by mouth once.   pantoprazole (PROTONIX) 40 MG tablet TAKE 1 TABLET BY MOUTH EVERY DAY   polyethylene glycol (MIRALAX / GLYCOLAX) 17 g packet Take 17 g by mouth daily.   traMADol-acetaminophen (ULTRACET) 37.5-325 MG  tablet Take by mouth.   traZODone (DESYREL) 50 MG tablet TAKE 3 TABLETS BY MOUTH AT BEDTIME   No facility-administered encounter medications on file as of 04/22/2022.    Review of Systems  Constitutional:  Positive for appetite change and fatigue.  HENT:  Positive for hearing loss.   Eyes: Negative.   Respiratory:  Positive for shortness of breath.   Cardiovascular: Negative.   Gastrointestinal: Negative.   Neurological: Negative.   Hematological: Negative.   Psychiatric/Behavioral:  Positive for confusion.     There were no vitals filed for this visit. There is no height or weight on file to calculate BMI. Physical Exam Vitals and nursing note reviewed.  Constitutional:      Appearance: Normal appearance.  HENT:     Head: Normocephalic.     Right Ear: Tympanic membrane normal.     Left Ear: Tympanic membrane normal.     Nose: Nose normal.     Mouth/Throat:     Mouth: Mucous membranes are moist.     Pharynx: Oropharynx is clear.  Eyes:     Pupils: Pupils are equal, round, and reactive to light.  Cardiovascular:     Rate and Rhythm: Normal rate and regular rhythm.  Pulmonary:     Effort: Pulmonary effort is normal.     Breath sounds: Normal breath sounds.     Comments: Oxygen per nasal cannula Abdominal:     General: Abdomen is flat. Bowel sounds are normal.  Musculoskeletal:     Cervical back: Normal range of motion.     Comments: Wearing splint on right wrist  Skin:    General: Skin is warm.  Neurological:     General: No focal deficit present.     Mental Status: She is alert and oriented to person, place, and time.     Comments: She is oriented but there is obviously some confusion  Psychiatric:        Mood and Affect: Mood normal.        Behavior: Behavior normal.     Labs reviewed: Basic Metabolic Panel: Recent  Labs    10/12/21 0318 10/24/21 0023 01/29/22 2306 01/30/22 1610 01/31/22 2244 02/01/22 0237 02/02/22 0801 04/10/22 1410  NA 142   < >  140   < > 137  --  136 142  K 3.6   < > 2.9*   < > 4.1  --  3.9 4.1  CL 109   < > 100   < > 102  --  105 103  CO2 28   < > 31   < > 28  --  22 26  GLUCOSE 87   < > 128*   < > 80  --  80 95  BUN 14   < > 17   < > 19  --  19 18  CREATININE 0.95   < > 1.11*   < > 1.30*  --  1.10* 0.92  CALCIUM 8.8*   < > 9.1   < > 8.9  --  8.6* 9.6  MG 2.3  --  1.7  --   --  2.1  --   --   PHOS  --   --  2.4*  --   --  4.1  --   --    < > = values in this interval not displayed.   Liver Function Tests: Recent Labs    10/12/21 0318 11/28/21 1106 01/08/22 1521 04/10/22 1410  AST '26 22 26 24  '$ ALT '15 10 14 13  '$ ALKPHOS 42  --  49 76  BILITOT 0.7 0.3 0.3 <0.2  PROT 5.5* 6.5 5.7* 6.1  ALBUMIN 3.0*  --  3.0* 3.8   No results for input(s): "LIPASE", "AMYLASE" in the last 8760 hours. No results for input(s): "AMMONIA" in the last 8760 hours. CBC: Recent Labs    01/08/22 1521 01/29/22 2306 01/30/22 1610 02/01/22 0237 02/02/22 0801 04/10/22 1410  WBC 7.3 10.3   < > 7.0 6.9 6.3  NEUTROABS 5.1 8.6*  --  4.2  --   --   HGB 9.7* 10.9*   < > 10.3* 10.0* 10.2*  HCT 30.6* 34.4*   < > 32.1* 32.4* 31.4*  MCV 93.6 92.2   < > 92.8 94.2 91  PLT 171 169   < > 180 187 231   < > = values in this interval not displayed.   Cardiac Enzymes: No results for input(s): "CKTOTAL", "CKMB", "CKMBINDEX", "TROPONINI" in the last 8760 hours. BNP: Invalid input(s): "POCBNP" Lab Results  Component Value Date   HGBA1C 5.2 07/09/2017   Lab Results  Component Value Date   TSH 2.88 11/28/2021   Lab Results  Component Value Date   VITAMINB12 1,463 (H) 10/24/2021   Lab Results  Component Value Date   FOLATE >24.0 10/24/2021   Lab Results  Component Value Date   IRON 82 10/11/2021   TIBC 285 10/11/2021   FERRITIN 67 09/25/2021    Imaging and Procedures obtained prior to SNF admission: CT Head Wo Contrast  Result Date: 02/12/2022 CLINICAL DATA:  Unwitnessed fall, head and neck trauma. Laceration above right  eye. EXAM: CT HEAD WITHOUT CONTRAST CT CERVICAL SPINE WITHOUT CONTRAST TECHNIQUE: Multidetector CT imaging of the head and cervical spine was performed following the standard protocol without intravenous contrast. Multiplanar CT image reconstructions of the cervical spine were also generated. RADIATION DOSE REDUCTION: This exam was performed according to the departmental dose-optimization program which includes automated exposure control, adjustment of the mA and/or kV according to patient size and/or use of iterative reconstruction technique. COMPARISON:  01/30/2022. FINDINGS: CT HEAD FINDINGS Brain: No acute intracranial hemorrhage, midline shift or mass effect. No extra-axial fluid collection. Periventricular white matter hypodensities are present bilaterally. No hydrocephalus. Vascular: No hyperdense vessel or unexpected calcification. Skull: Normal. Negative for fracture or focal lesion. Sinuses/Orbits: No acute finding. Other: Small scalp hematoma with laceration over the frontal bone on the right. CT CERVICAL SPINE FINDINGS Alignment: There is mild anterolisthesis at C2-C3 and C3-C4. Skull base and vertebrae: No acute fracture. No primary bone lesion or focal pathologic process. Soft tissues and spinal canal: No prevertebral fluid or swelling. No visible canal hematoma. Disc levels: Multilevel intervertebral disc space narrowing, uncovertebral osteophyte formation, and facet arthropathy. Upper chest: Emphysematous changes in the lungs with right upper lobe scarring. Other: Carotid artery calcifications. Hypodensities are present in the left lobe of the thyroid gland measuring up to 9 mm. IMPRESSION: 1. No acute intracranial process. 2. Atrophy with chronic microvascular ischemic changes. 3. Small scalp hematoma with laceration over the frontal bone on the right. 4. Degenerative changes in the cervical spine without evidence of acute fracture. Electronically Signed   By: Brett Fairy M.D.   On: 02/12/2022  05:00   CT Cervical Spine Wo Contrast  Result Date: 02/12/2022 CLINICAL DATA:  Unwitnessed fall, head and neck trauma. Laceration above right eye. EXAM: CT HEAD WITHOUT CONTRAST CT CERVICAL SPINE WITHOUT CONTRAST TECHNIQUE: Multidetector CT imaging of the head and cervical spine was performed following the standard protocol without intravenous contrast. Multiplanar CT image reconstructions of the cervical spine were also generated. RADIATION DOSE REDUCTION: This exam was performed according to the departmental dose-optimization program which includes automated exposure control, adjustment of the mA and/or kV according to patient size and/or use of iterative reconstruction technique. COMPARISON:  01/30/2022. FINDINGS: CT HEAD FINDINGS Brain: No acute intracranial hemorrhage, midline shift or mass effect. No extra-axial fluid collection. Periventricular white matter hypodensities are present bilaterally. No hydrocephalus. Vascular: No hyperdense vessel or unexpected calcification. Skull: Normal. Negative for fracture or focal lesion. Sinuses/Orbits: No acute finding. Other: Small scalp hematoma with laceration over the frontal bone on the right. CT CERVICAL SPINE FINDINGS Alignment: There is mild anterolisthesis at C2-C3 and C3-C4. Skull base and vertebrae: No acute fracture. No primary bone lesion or focal pathologic process. Soft tissues and spinal canal: No prevertebral fluid or swelling. No visible canal hematoma. Disc levels: Multilevel intervertebral disc space narrowing, uncovertebral osteophyte formation, and facet arthropathy. Upper chest: Emphysematous changes in the lungs with right upper lobe scarring. Other: Carotid artery calcifications. Hypodensities are present in the left lobe of the thyroid gland measuring up to 9 mm. IMPRESSION: 1. No acute intracranial process. 2. Atrophy with chronic microvascular ischemic changes. 3. Small scalp hematoma with laceration over the frontal bone on the right. 4.  Degenerative changes in the cervical spine without evidence of acute fracture. Electronically Signed   By: Brett Fairy M.D.   On: 02/12/2022 05:00   DG Forearm Right  Result Date: 02/12/2022 CLINICAL DATA:  Fall with pain and distal forearm and wrist. EXAM: RIGHT HAND - COMPLETE 3+ VIEW; RIGHT FOREARM - 2 VIEW COMPARISON:  None Available. FINDINGS: There is a comminuted fracture of the distal radius with intra-articular extension and dorsal angulation of the distal fracture fragment. There is a nondisplaced impaction fracture of the distal ulnar metaphysis. Remaining bony structures appear intact. Degenerative changes are present at the wrist and interphalangeal joints. Soft tissue swelling is noted about the wrist. IMPRESSION: 1. Comminuted impaction fracture of the distal radius  with dorsal angulation. 2. Nondisplaced fracture of the distal ulnar metaphysis. Electronically Signed   By: Brett Fairy M.D.   On: 02/12/2022 04:49   DG Hand Complete Right  Result Date: 02/12/2022 CLINICAL DATA:  Fall with pain and distal forearm and wrist. EXAM: RIGHT HAND - COMPLETE 3+ VIEW; RIGHT FOREARM - 2 VIEW COMPARISON:  None Available. FINDINGS: There is a comminuted fracture of the distal radius with intra-articular extension and dorsal angulation of the distal fracture fragment. There is a nondisplaced impaction fracture of the distal ulnar metaphysis. Remaining bony structures appear intact. Degenerative changes are present at the wrist and interphalangeal joints. Soft tissue swelling is noted about the wrist. IMPRESSION: 1. Comminuted impaction fracture of the distal radius with dorsal angulation. 2. Nondisplaced fracture of the distal ulnar metaphysis. Electronically Signed   By: Brett Fairy M.D.   On: 02/12/2022 04:49    Assessment/Plan 1. Chronic low back pain, unspecified back pain laterality, unspecified whether sciatica present History of compression fractures.  Had formally been managed with  hydrocodone but she reports pain is now controlled with tramadol and Tylenol  2. Chronic respiratory failure with hypoxia (HCC) Followed by pulmonary.  History of severe COPD.  Continues with Atrovent nebulizer 3 times a day Pulmicort twice daily and as needed albuterol.  Also on chronic oxygen     4. Malnutrition of mild degree ([place]) Patient has no appetite per her history.  Per her history she has lost weight.  Consider use of appetite stimulant such as mirtazapine and low-dose    6. Depression, unspecified depression type 2 continue with low-dose Lexapro and trazodone     Family/ staff Communication:   Labs/tests ordered:  .[email or web address]

## 2022-04-23 DIAGNOSIS — M6281 Muscle weakness (generalized): Secondary | ICD-10-CM | POA: Diagnosis not present

## 2022-04-23 DIAGNOSIS — R41841 Cognitive communication deficit: Secondary | ICD-10-CM | POA: Diagnosis not present

## 2022-04-23 DIAGNOSIS — R1312 Dysphagia, oropharyngeal phase: Secondary | ICD-10-CM | POA: Diagnosis not present

## 2022-04-23 DIAGNOSIS — R278 Other lack of coordination: Secondary | ICD-10-CM | POA: Diagnosis not present

## 2022-04-23 DIAGNOSIS — R2689 Other abnormalities of gait and mobility: Secondary | ICD-10-CM | POA: Diagnosis not present

## 2022-04-24 ENCOUNTER — Telehealth: Payer: Self-pay | Admitting: Neurology

## 2022-04-24 DIAGNOSIS — R278 Other lack of coordination: Secondary | ICD-10-CM | POA: Diagnosis not present

## 2022-04-24 DIAGNOSIS — S52501A Unspecified fracture of the lower end of right radius, initial encounter for closed fracture: Secondary | ICD-10-CM | POA: Diagnosis not present

## 2022-04-24 DIAGNOSIS — R1312 Dysphagia, oropharyngeal phase: Secondary | ICD-10-CM | POA: Diagnosis not present

## 2022-04-24 DIAGNOSIS — M6281 Muscle weakness (generalized): Secondary | ICD-10-CM | POA: Diagnosis not present

## 2022-04-24 DIAGNOSIS — R41841 Cognitive communication deficit: Secondary | ICD-10-CM | POA: Diagnosis not present

## 2022-04-24 DIAGNOSIS — R2689 Other abnormalities of gait and mobility: Secondary | ICD-10-CM | POA: Diagnosis not present

## 2022-04-25 ENCOUNTER — Encounter: Payer: Self-pay | Admitting: Nurse Practitioner

## 2022-04-25 DIAGNOSIS — R2689 Other abnormalities of gait and mobility: Secondary | ICD-10-CM | POA: Diagnosis not present

## 2022-04-25 DIAGNOSIS — R278 Other lack of coordination: Secondary | ICD-10-CM | POA: Diagnosis not present

## 2022-04-25 DIAGNOSIS — M159 Polyosteoarthritis, unspecified: Secondary | ICD-10-CM | POA: Insufficient documentation

## 2022-04-25 DIAGNOSIS — R41841 Cognitive communication deficit: Secondary | ICD-10-CM | POA: Diagnosis not present

## 2022-04-25 DIAGNOSIS — R1312 Dysphagia, oropharyngeal phase: Secondary | ICD-10-CM | POA: Diagnosis not present

## 2022-04-25 DIAGNOSIS — M6281 Muscle weakness (generalized): Secondary | ICD-10-CM | POA: Diagnosis not present

## 2022-04-28 ENCOUNTER — Ambulatory Visit: Payer: Medicare PPO | Admitting: Cardiovascular Disease

## 2022-04-28 DIAGNOSIS — R1312 Dysphagia, oropharyngeal phase: Secondary | ICD-10-CM | POA: Diagnosis not present

## 2022-04-28 DIAGNOSIS — R2689 Other abnormalities of gait and mobility: Secondary | ICD-10-CM | POA: Diagnosis not present

## 2022-04-28 DIAGNOSIS — M6281 Muscle weakness (generalized): Secondary | ICD-10-CM | POA: Diagnosis not present

## 2022-04-28 DIAGNOSIS — R41841 Cognitive communication deficit: Secondary | ICD-10-CM | POA: Diagnosis not present

## 2022-04-28 DIAGNOSIS — R278 Other lack of coordination: Secondary | ICD-10-CM | POA: Diagnosis not present

## 2022-04-29 DIAGNOSIS — R41841 Cognitive communication deficit: Secondary | ICD-10-CM | POA: Diagnosis not present

## 2022-04-29 DIAGNOSIS — R1312 Dysphagia, oropharyngeal phase: Secondary | ICD-10-CM | POA: Diagnosis not present

## 2022-04-29 DIAGNOSIS — R2689 Other abnormalities of gait and mobility: Secondary | ICD-10-CM | POA: Diagnosis not present

## 2022-04-29 DIAGNOSIS — M6281 Muscle weakness (generalized): Secondary | ICD-10-CM | POA: Diagnosis not present

## 2022-04-29 DIAGNOSIS — R278 Other lack of coordination: Secondary | ICD-10-CM | POA: Diagnosis not present

## 2022-04-29 NOTE — Telephone Encounter (Signed)
Error

## 2022-04-30 DIAGNOSIS — R41841 Cognitive communication deficit: Secondary | ICD-10-CM | POA: Diagnosis not present

## 2022-04-30 DIAGNOSIS — R2689 Other abnormalities of gait and mobility: Secondary | ICD-10-CM | POA: Diagnosis not present

## 2022-04-30 DIAGNOSIS — R1312 Dysphagia, oropharyngeal phase: Secondary | ICD-10-CM | POA: Diagnosis not present

## 2022-04-30 DIAGNOSIS — R278 Other lack of coordination: Secondary | ICD-10-CM | POA: Diagnosis not present

## 2022-04-30 DIAGNOSIS — M6281 Muscle weakness (generalized): Secondary | ICD-10-CM | POA: Diagnosis not present

## 2022-05-01 DIAGNOSIS — M6281 Muscle weakness (generalized): Secondary | ICD-10-CM | POA: Diagnosis not present

## 2022-05-01 DIAGNOSIS — R2689 Other abnormalities of gait and mobility: Secondary | ICD-10-CM | POA: Diagnosis not present

## 2022-05-01 DIAGNOSIS — R1312 Dysphagia, oropharyngeal phase: Secondary | ICD-10-CM | POA: Diagnosis not present

## 2022-05-01 DIAGNOSIS — R278 Other lack of coordination: Secondary | ICD-10-CM | POA: Diagnosis not present

## 2022-05-01 DIAGNOSIS — R41841 Cognitive communication deficit: Secondary | ICD-10-CM | POA: Diagnosis not present

## 2022-05-02 DIAGNOSIS — M6281 Muscle weakness (generalized): Secondary | ICD-10-CM | POA: Diagnosis not present

## 2022-05-02 DIAGNOSIS — R2689 Other abnormalities of gait and mobility: Secondary | ICD-10-CM | POA: Diagnosis not present

## 2022-05-02 DIAGNOSIS — R41841 Cognitive communication deficit: Secondary | ICD-10-CM | POA: Diagnosis not present

## 2022-05-02 DIAGNOSIS — R1312 Dysphagia, oropharyngeal phase: Secondary | ICD-10-CM | POA: Diagnosis not present

## 2022-05-02 DIAGNOSIS — R278 Other lack of coordination: Secondary | ICD-10-CM | POA: Diagnosis not present

## 2022-05-05 DIAGNOSIS — R2689 Other abnormalities of gait and mobility: Secondary | ICD-10-CM | POA: Diagnosis not present

## 2022-05-05 DIAGNOSIS — M6281 Muscle weakness (generalized): Secondary | ICD-10-CM | POA: Diagnosis not present

## 2022-05-05 DIAGNOSIS — R41841 Cognitive communication deficit: Secondary | ICD-10-CM | POA: Diagnosis not present

## 2022-05-05 DIAGNOSIS — R1312 Dysphagia, oropharyngeal phase: Secondary | ICD-10-CM | POA: Diagnosis not present

## 2022-05-05 DIAGNOSIS — R278 Other lack of coordination: Secondary | ICD-10-CM | POA: Diagnosis not present

## 2022-05-06 DIAGNOSIS — R278 Other lack of coordination: Secondary | ICD-10-CM | POA: Diagnosis not present

## 2022-05-06 DIAGNOSIS — M6281 Muscle weakness (generalized): Secondary | ICD-10-CM | POA: Diagnosis not present

## 2022-05-06 DIAGNOSIS — R1312 Dysphagia, oropharyngeal phase: Secondary | ICD-10-CM | POA: Diagnosis not present

## 2022-05-06 DIAGNOSIS — R2689 Other abnormalities of gait and mobility: Secondary | ICD-10-CM | POA: Diagnosis not present

## 2022-05-06 DIAGNOSIS — R41841 Cognitive communication deficit: Secondary | ICD-10-CM | POA: Diagnosis not present

## 2022-05-07 DIAGNOSIS — R2689 Other abnormalities of gait and mobility: Secondary | ICD-10-CM | POA: Diagnosis not present

## 2022-05-07 DIAGNOSIS — M6281 Muscle weakness (generalized): Secondary | ICD-10-CM | POA: Diagnosis not present

## 2022-05-07 DIAGNOSIS — R1312 Dysphagia, oropharyngeal phase: Secondary | ICD-10-CM | POA: Diagnosis not present

## 2022-05-07 DIAGNOSIS — R41841 Cognitive communication deficit: Secondary | ICD-10-CM | POA: Diagnosis not present

## 2022-05-07 DIAGNOSIS — R278 Other lack of coordination: Secondary | ICD-10-CM | POA: Diagnosis not present

## 2022-05-09 DIAGNOSIS — R41841 Cognitive communication deficit: Secondary | ICD-10-CM | POA: Diagnosis not present

## 2022-05-09 DIAGNOSIS — R278 Other lack of coordination: Secondary | ICD-10-CM | POA: Diagnosis not present

## 2022-05-09 DIAGNOSIS — R1312 Dysphagia, oropharyngeal phase: Secondary | ICD-10-CM | POA: Diagnosis not present

## 2022-05-09 DIAGNOSIS — M6281 Muscle weakness (generalized): Secondary | ICD-10-CM | POA: Diagnosis not present

## 2022-05-09 DIAGNOSIS — R2689 Other abnormalities of gait and mobility: Secondary | ICD-10-CM | POA: Diagnosis not present

## 2022-05-10 ENCOUNTER — Encounter (HOSPITAL_COMMUNITY): Payer: Self-pay | Admitting: Emergency Medicine

## 2022-05-10 ENCOUNTER — Emergency Department (HOSPITAL_COMMUNITY)
Admission: EM | Admit: 2022-05-10 | Discharge: 2022-05-10 | Disposition: A | Discharge status: Home / Self Care | Payer: Medicare PPO | Attending: Emergency Medicine | Admitting: Emergency Medicine

## 2022-05-10 ENCOUNTER — Encounter: Payer: Self-pay | Admitting: Physician Assistant

## 2022-05-10 ENCOUNTER — Emergency Department (HOSPITAL_COMMUNITY): Payer: Medicare PPO

## 2022-05-10 DIAGNOSIS — I959 Hypotension, unspecified: Secondary | ICD-10-CM | POA: Diagnosis not present

## 2022-05-10 DIAGNOSIS — S0003XA Contusion of scalp, initial encounter: Secondary | ICD-10-CM | POA: Diagnosis not present

## 2022-05-10 DIAGNOSIS — J45909 Unspecified asthma, uncomplicated: Secondary | ICD-10-CM | POA: Insufficient documentation

## 2022-05-10 DIAGNOSIS — S0990XA Unspecified injury of head, initial encounter: Secondary | ICD-10-CM | POA: Diagnosis not present

## 2022-05-10 DIAGNOSIS — Z7951 Long term (current) use of inhaled steroids: Secondary | ICD-10-CM | POA: Insufficient documentation

## 2022-05-10 DIAGNOSIS — Y92002 Bathroom of unspecified non-institutional (private) residence single-family (private) house as the place of occurrence of the external cause: Secondary | ICD-10-CM | POA: Insufficient documentation

## 2022-05-10 DIAGNOSIS — W182XXA Fall in (into) shower or empty bathtub, initial encounter: Secondary | ICD-10-CM | POA: Insufficient documentation

## 2022-05-10 DIAGNOSIS — Z7982 Long term (current) use of aspirin: Secondary | ICD-10-CM | POA: Insufficient documentation

## 2022-05-10 DIAGNOSIS — J449 Chronic obstructive pulmonary disease, unspecified: Secondary | ICD-10-CM | POA: Diagnosis not present

## 2022-05-10 DIAGNOSIS — R531 Weakness: Secondary | ICD-10-CM | POA: Diagnosis not present

## 2022-05-10 DIAGNOSIS — W19XXXA Unspecified fall, initial encounter: Secondary | ICD-10-CM | POA: Diagnosis not present

## 2022-05-10 DIAGNOSIS — T68XXXA Hypothermia, initial encounter: Secondary | ICD-10-CM | POA: Diagnosis not present

## 2022-05-10 DIAGNOSIS — R519 Headache, unspecified: Secondary | ICD-10-CM | POA: Diagnosis not present

## 2022-05-10 DIAGNOSIS — Z7401 Bed confinement status: Secondary | ICD-10-CM | POA: Diagnosis not present

## 2022-05-10 DIAGNOSIS — S060X0A Concussion without loss of consciousness, initial encounter: Secondary | ICD-10-CM | POA: Insufficient documentation

## 2022-05-10 NOTE — ED Triage Notes (Signed)
Pt arriving via [facility] from [facility] rehab following a fall. According to staff, pt has had multiple falls recently. Pt has hematoma on her forehead. Rating pain 4/10 at this time. Pt has skin tear on right forearm from previous fall. Pt denies use of assistive devices. Unable to recall what she was doing when she fell tonight, believes she was trying to go to the bathroom. Pt not on blood thinners.

## 2022-05-10 NOTE — ED Provider Notes (Signed)
[facility] DEPT Provider Note   CSN: [id number] Arrival date & time: 05/10/22  0034     History  Chief Complaint  Patient presents with   Katherine [name] is a 88 y.o. female.  The history is provided by the spouse and the patient.  Patient presents after accidental fall.  Patient lives in a local rehab facility.  Patient fell tonight when she was try to go the bathroom.  She is not on anticoagulation.  Patient has a skin tear to her right elbow and also hit her head.  No LOC. No acute complaints Patient is on chronic oxygen    Past Medical History:  Diagnosis Date   Allergy    Anemia    Arthritis    Asthma    patient denies   B12 deficiency    COPD (chronic obstructive pulmonary disease) ([place])    Depression    not recent   Diverticulosis    Esophageal stricture    Hepatic cyst 01/30/2010   Hiatal hernia 1985   patient unaware   Hx of adenomatous colonic polyps 2006   Hyperlipidemia    IBS (irritable bowel syndrome)    PONV (postoperative nausea and vomiting)    no nausea or vomitting after surgery 07/2014   Positive PPD    PUD (peptic ulcer disease)    S/P TAVR (transcatheter aortic valve replacement) 08/13/2021   s/p TAVR with a 40m Edwards S3UR via the right carotid approach by Dr. MAngelena Form& Dr. BCyndia Bent  Severe aortic stenosis     Home Medications Prior to Admission medications   Medication Sig Start Date End Date Taking? Authorizing Provider  acetaminophen (TYLENOL) 325 MG tablet Take 650 mg by mouth as needed.    [provider]  albuterol (VENTOLIN HFA) 108 (90 Base) MCG/ACT inhaler INHALE 2 PUFFS INTO THE LUNGS 4 (FOUR) TIMES DAILY AS NEEDED FOR WHEEZING OR SHORTNESS OF BREATH. 07/10/21   Byrum, RRose Fillers MD  ALPRAZolam (Duanne Moron 0.25 MG tablet Take 1 tablet (0.25 mg total) by mouth at bedtime as needed for anxiety or sleep. 04/16/22   Medina-Vargas, Monina C, NP  aspirin EC 81 MG tablet Take 81 mg by mouth  daily. Swallow whole.    [provider]  bisacodyl (DULCOLAX) 10 MG suppository Place 1 suppository (10 mg total) rectally every 12 (twelve) hours as needed for moderate constipation or severe constipation. 02/04/22   Amin, Ankit Chirag, MD  budesonide (PULMICORT) 0.5 MG/2ML nebulizer solution TAKE 2 MLS (0.5 MG TOTAL) BY NEBULIZATION 2 (TWO) TIMES DAILY. 09/23/21   Cobb, KKarie Schwalbe NP  Cyanocobalamin (VITAMIN B-12 PO) Take 1 tablet by mouth daily.    [provider]  diclofenac Sodium (VOLTAREN) 1 % GEL Apply 4 g topically 4 (four) times daily. 01/08/22   FDeno Etienne DO  docusate sodium (COLACE) 100 MG capsule Take 1 capsule (100 mg total) by mouth 2 (two) times daily. 02/04/22   Amin, AJeanella Flattery MD  donepezil (ARICEPT) 5 MG tablet Take 1 tablet (5 mg total) by mouth at bedtime. 04/10/22   CAlric Ran MD  escitalopram (LEXAPRO) 20 MG tablet Take 1 tablet (20 mg total) by mouth daily. 04/21/22 04/16/23  CAlric Ran MD  fluticasone (FLONASE) 50 MCG/ACT nasal spray Place 1 spray into both nostrils daily. 07/24/21   Cobb, KKarie Schwalbe NP  gabapentin (NEURONTIN) 100 MG capsule Take 1 capsule (100 mg total) by mouth 3 (three) times daily. 02/04/22  Amin, Ankit Chirag, MD  ipratropium (ATROVENT) 0.02 % nebulizer solution TAKE 2.5 MLS (0.5 MG TOTAL) BY NEBULIZATION 4 (FOUR) TIMES DAILY. 02/09/21   Elby Showers, MD  lidocaine (HM LIDOCAINE PATCH) 4 % Place 1 patch onto the skin daily.    [provider]  Lidocaine (SALONPAS PAIN RELIEVING EX) Apply 1 Application topically 2 (two) times daily as needed (pain).    [provider]  Melatonin 10 MG CAPS Take 10 mg by mouth at bedtime.    [provider]  Multiple Vitamins-Minerals (MULTIVITAMIN WITH MINERALS) tablet Take 1 tablet by mouth daily.    [provider]  ondansetron [facility]) 4 MG/5ML solution Take by mouth once.    [provider]  pantoprazole (PROTONIX) 40 MG tablet TAKE 1 TABLET  BY MOUTH EVERY DAY 10/14/21   Cobb, Karie Schwalbe, NP  polyethylene glycol (MIRALAX / GLYCOLAX) 17 g packet Take 17 g by mouth daily. 02/04/22   Damita Lack, MD  traMADol-acetaminophen Caroline Sauger) 37.5-325 MG tablet Take by mouth. 02/06/22   [provider]  traZODone (DESYREL) 50 MG tablet TAKE 3 TABLETS BY MOUTH AT BEDTIME 03/02/22   Elby Showers, MD      Allergies    Patient has no known allergies.    Review of Systems   Review of Systems  Skin:  Positive for wound.    Physical Exam Updated Vital Signs BP (!) 161/65   Pulse 79   Temp (!) 97.5 F (36.4 C) (Oral)   SpO2 100%  Physical Exam CONSTITUTIONAL: Elderly and frail, no acute distress HEAD: Hematoma noted to forehead, no other signs of trauma EYES: EOMI/PERRL ENMT: Mucous membranes moist NECK: supple no meningeal signs SPINE/BACK:entire spine nontender No bruising/crepitance/stepoffs noted to spine CV: S1/S2 noted, no murmurs/rubs/gallops noted LUNGS: Lungs are clear to auscultation bilaterally, she is wearing oxygen Chest-no bruising or crepitus ABDOMEN: soft, nontender GU:no cva tenderness NEURO: Pt is awake/alert/appropriate, moves all extremitiesx4.  No facial droop.   EXTREMITIES: pulses normal/equal, full ROM Skin tear noted to right elbow.  No other lacerations.  She is already wearing a Velcro splint to her right wrist from previous injury. Pelvis stable All other extremities/joints palpated/ranged and nontender SKIN: warm, color normal PSYCH: no abnormalities of mood noted, alert and oriented to situation  ED Results / Procedures / Treatments   Labs (all labs ordered are listed, but only abnormal results are displayed) Labs Reviewed - No data to display  EKG None  Radiology CT Head Wo Contrast  Result Date: 05/10/2022 CLINICAL DATA:  Multiple recent falls with frontal hematoma, initial encounter EXAM: CT HEAD WITHOUT CONTRAST TECHNIQUE: Contiguous axial images were obtained from the  base of the skull through the vertex without intravenous contrast. RADIATION DOSE REDUCTION: This exam was performed according to the departmental dose-optimization program which includes automated exposure control, adjustment of the mA and/or kV according to patient size and/or use of iterative reconstruction technique. COMPARISON:  02/12/2022 FINDINGS: Brain: No evidence of acute infarction, hemorrhage, hydrocephalus, extra-axial collection or mass lesion/mass effect. Chronic atrophic and ischemic changes are noted. Vascular: No hyperdense vessel or unexpected calcification. Skull: Normal. Negative for fracture or focal lesion. Sinuses/Orbits: No acute finding. Other: Left frontal hematoma is noted consistent with the recent injury. IMPRESSION: Left frontal scalp hematoma. Chronic atrophic and ischemic changes without acute abnormality. Electronically Signed   By: Inez Catalina M.D.   On: 05/10/2022 02:37    Procedures Procedures    Medications Ordered in ED Medications -  No data to display  ED Course/ Medical Decision Making/ A&P Clinical Course as of 05/10/22 0255  Sat May 10, 2022  0254 Patient resting comfortably.  CT head negative for acute intracranial hemorrhage.  She has no other complaints.  She will be discharged back to facility [DW]    Clinical Course User Index [DW] Ripley Fraise, MD                           Medical Decision Making Amount and/or Complexity of Data Reviewed Radiology: ordered.   This patient presents to the ED for concern of head injury, this involves an extensive number of treatment options, and is a complaint that carries with it a high risk of complications and morbidity.  The differential diagnosis includes but is not limited to concussion, subdural hematoma, subarachnoid hemorrhage, skull fracture  Comorbidities that complicate the patient evaluation: Patient's presentation is complicated by their history of COPD  Social Determinants of  Health: Patient's  poor mobility   increases the complexity of managing their presentation  Additional history obtained: Additional history obtained from spouse  Imaging Studies ordered: I ordered imaging studies including CT scan head   I independently visualized and interpreted imaging which showed no acute findings I agree with the radiologist interpretation  Reevaluation: After the interventions noted above, I reevaluated the patient and found that they have :improved  Complexity of problems addressed: Patient's presentation is most consistent with  acute presentation with potential threat to life or bodily function  Disposition: After consideration of the diagnostic results and the patient's response to treatment,  I feel that the patent would benefit from discharge   .           Final Clinical Impression(s) / ED Diagnoses Final diagnoses:  Injury of head, initial encounter  Concussion without loss of consciousness, initial encounter    Rx / DC Orders ED Discharge Orders     None         Ripley Fraise, MD 05/10/22 [phone]

## 2022-05-12 ENCOUNTER — Encounter: Payer: Self-pay | Admitting: Nurse Practitioner

## 2022-05-12 ENCOUNTER — Non-Acute Institutional Stay (SKILLED_NURSING_FACILITY): Payer: Medicare PPO | Admitting: Nurse Practitioner

## 2022-05-12 DIAGNOSIS — M159 Polyosteoarthritis, unspecified: Secondary | ICD-10-CM

## 2022-05-12 DIAGNOSIS — K5901 Slow transit constipation: Secondary | ICD-10-CM

## 2022-05-12 DIAGNOSIS — F01A0 Vascular dementia, mild, without behavioral disturbance, psychotic disturbance, mood disturbance, and anxiety: Secondary | ICD-10-CM | POA: Diagnosis not present

## 2022-05-12 DIAGNOSIS — F0390 Unspecified dementia without behavioral disturbance: Secondary | ICD-10-CM | POA: Insufficient documentation

## 2022-05-12 DIAGNOSIS — R1312 Dysphagia, oropharyngeal phase: Secondary | ICD-10-CM | POA: Diagnosis not present

## 2022-05-12 DIAGNOSIS — R278 Other lack of coordination: Secondary | ICD-10-CM | POA: Diagnosis not present

## 2022-05-12 DIAGNOSIS — R030 Elevated blood-pressure reading, without diagnosis of hypertension: Secondary | ICD-10-CM

## 2022-05-12 DIAGNOSIS — R2689 Other abnormalities of gait and mobility: Secondary | ICD-10-CM | POA: Diagnosis not present

## 2022-05-12 DIAGNOSIS — S0083XD Contusion of other part of head, subsequent encounter: Secondary | ICD-10-CM | POA: Diagnosis not present

## 2022-05-12 DIAGNOSIS — S0083XA Contusion of other part of head, initial encounter: Secondary | ICD-10-CM | POA: Insufficient documentation

## 2022-05-12 DIAGNOSIS — J449 Chronic obstructive pulmonary disease, unspecified: Secondary | ICD-10-CM | POA: Diagnosis not present

## 2022-05-12 DIAGNOSIS — M150 Primary generalized (osteo)arthritis: Secondary | ICD-10-CM

## 2022-05-12 DIAGNOSIS — K219 Gastro-esophageal reflux disease without esophagitis: Secondary | ICD-10-CM

## 2022-05-12 DIAGNOSIS — R41841 Cognitive communication deficit: Secondary | ICD-10-CM | POA: Diagnosis not present

## 2022-05-12 DIAGNOSIS — R269 Unspecified abnormalities of gait and mobility: Secondary | ICD-10-CM

## 2022-05-12 DIAGNOSIS — F0150 Vascular dementia without behavioral disturbance: Secondary | ICD-10-CM | POA: Insufficient documentation

## 2022-05-12 DIAGNOSIS — M6281 Muscle weakness (generalized): Secondary | ICD-10-CM | POA: Diagnosis not present

## 2022-05-12 NOTE — Progress Notes (Unsigned)
This encounter was created in error - please disregard.

## 2022-05-12 NOTE — Assessment & Plan Note (Signed)
takes Pantoprazolem Hgb 19,2 04/10/22

## 2022-05-12 NOTE — Assessment & Plan Note (Signed)
takes ASA, Donepezil,  CT head 05/10/22 Left frontal scalp hematoma. Chronic atrophic and ischemic changes without acute abnormality.

## 2022-05-12 NOTE — Progress Notes (Signed)
Location:   S[facility] Room Number: 1 Place of Service:  SNF (31) Provider: Lennie Odor Yale Golla NP  Baxley, Cresenciano Lick, MD  Patient Care Team: Elby Showers, MD as PCP - General (Internal Medicine) Burnell Blanks, MD as PCP - Cardiology (Cardiology) Collene Gobble, MD as Consulting Physician (Pulmonary Disease)  Extended Emergency Contact Information Primary Emergency Contact: Cobb,[name] Address: [address]          [place], [place] [address] [place] Phone: [phone] Mobile Phone: [phone] Relation: Spouse Secondary Emergency Contact: Sunday Shams Mobile Phone: [phone] Relation: Granddaughter  Code Status: DNR Goals of care: Advanced Directive information    05/10/2022   12:52 AM  Advanced Directives  Does Patient Have a Medical Advance Directive? Yes  Type of Advance Directive Out of facility DNR (pink MOST or yellow form)     Chief Complaint  Patient presents with   Acute Visit    ED evaluation for fall, forehead contusion.     HPI:  Pt is a 88 y.o. female seen today for an acute visit for fall when the patient attempted transferring self w/o assistance,  struck forehead, resulted forehead contusion. CT head 05/10/22 Left frontal scalp hematoma. Chronic atrophic and ischemic changes without acute abnormality.  GERD, takes Pantoprazolem Hgb 19,2 04/10/22  Constipation, takes MiraLax.   Vascular cognitive impairment, takes ASA, Donepezil,  CT head 05/10/22 Left frontal scalp hematoma. Chronic atrophic and ischemic changes without acute abnormality.  Chronic back pain, Hx of compression fx, managed with Tramadol, Tylenol, Gabapentin  COPD, hypoxia, chronic respiratory failure, followed by Pulmonology, takes Atrovent Neb, Pulmicort, prn Albuterol. O2  Depression, takes Lexapro, Trazodone, prn Alprazolam.   Gait abnormality, needs assistance with transfer, mobility.      Past Medical History:  Diagnosis Date   Allergy     Anemia    Arthritis    Asthma    patient denies   B12 deficiency    COPD (chronic obstructive pulmonary disease) ([place])    Depression    not recent   Diverticulosis    Esophageal stricture    Hepatic cyst 01/30/2010   Hiatal hernia 1985   patient unaware   Hx of adenomatous colonic polyps 2006   Hyperlipidemia    IBS (irritable bowel syndrome)    PONV (postoperative nausea and vomiting)    no nausea or vomitting after surgery 07/2014   Positive PPD    PUD (peptic ulcer disease)    S/P TAVR (transcatheter aortic valve replacement) 08/13/2021   s/p TAVR with a 42m Edwards S3UR via the right carotid approach by Dr. MAngelena Form& Dr. BCyndia Bent  Severe aortic stenosis    Past Surgical History:  Procedure Laterality Date   ABDOMINAL HYSTERECTOMY     abnormal bleeding   APPENDECTOMY     BARTHOLIN GLAND CYST EXCISION     x2   BREAST BIOPSY Bilateral    Milk glnd   CATARACT EXTRACTION W/ INTRAOCULAR LENS  IMPLANT, BILATERAL Bilateral    COLONOSCOPY     COLONOSCOPY W/ POLYPECTOMY     HARDWARE REMOVAL N/A 12/13/2015   Procedure: Removal of HARDWARE  Lumbar Five-Sacral One ;  Surgeon: GKary Kos MD;  Location: [facility];  Service: Neurosurgery;  Laterality: N/A;   IR KYPHO LUMBAR INC FX REDUCE BONE BX UNI/BIL CANNULATION INC/IMAGING  07/13/2019   PERICARDIAL WINDOW  08/13/2021   Procedure: PERICARDIAL WINDOW;  Surgeon: MBurnell Blanks MD;  [facility]  Service:  Open Heart Surgery;;   RIGHT/LEFT HEART CATH AND CORONARY ANGIOGRAPHY N/A 07/11/2021   Procedure: RIGHT/LEFT HEART CATH AND CORONARY ANGIOGRAPHY;  Surgeon: Burnell Blanks, MD;  Location: [facility];  Service: Cardiovascular;  Laterality: N/A;   TEE WITHOUT CARDIOVERSION N/A 08/13/2021   Procedure: TRANSESOPHAGEAL ECHOCARDIOGRAM (TEE);  Surgeon: Burnell Blanks, MD;  Location: [facility];  Service: Open Heart Surgery;  Laterality: N/A;   TRANSCATHETER AORTIC VALVE REPLACEMENT, CAROTID Right  08/13/2021   Procedure: Transcatheter Aortic Valve Replacement-Carotid;  Surgeon: Burnell Blanks, MD;  Location: [facility];  Service: Open Heart Surgery;  Laterality: Right;   UPPER GI ENDOSCOPY     VAGOTOMY AND PYLOROPLASTY     in [place], [place]    No Known Allergies  Allergies as of 05/12/2022   No Known Allergies      Medication List        Accurate as of May 12, 2022  2:03 PM. If you have any questions, ask your nurse or doctor.          acetaminophen 325 MG tablet Commonly known as: TYLENOL Take 650 mg by mouth as needed.   albuterol 108 (90 Base) MCG/ACT inhaler Commonly known as: VENTOLIN HFA INHALE 2 PUFFS INTO THE LUNGS 4 (FOUR) TIMES DAILY AS NEEDED FOR WHEEZING OR SHORTNESS OF BREATH.   ALPRAZolam 0.25 MG tablet Commonly known as: XANAX Take 1 tablet (0.25 mg total) by mouth at bedtime as needed for anxiety or sleep.   aspirin EC 81 MG tablet Take 81 mg by mouth daily. Swallow whole.   bisacodyl 10 MG suppository Commonly known as: DULCOLAX Place 1 suppository (10 mg total) rectally every 12 (twelve) hours as needed for moderate constipation or severe constipation.   budesonide 0.5 MG/2ML nebulizer solution Commonly known as: PULMICORT TAKE 2 MLS (0.5 MG TOTAL) BY NEBULIZATION 2 (TWO) TIMES DAILY.   diclofenac Sodium 1 % Gel Commonly known as: VOLTAREN Apply 4 g topically 4 (four) times daily.   docusate sodium 100 MG capsule Commonly known as: COLACE Take 1 capsule (100 mg total) by mouth 2 (two) times daily.   donepezil 5 MG tablet Commonly known as: ARICEPT Take 1 tablet (5 mg total) by mouth at bedtime.   escitalopram 20 MG tablet Commonly known as: LEXAPRO Take 1 tablet (20 mg total) by mouth daily.   fluticasone 50 MCG/ACT nasal spray Commonly known as: FLONASE Place 1 spray into both nostrils daily.   gabapentin 100 MG capsule Commonly known as: NEURONTIN Take 1 capsule (100 mg total) by mouth 3 (three) times daily.    ipratropium 0.02 % nebulizer solution Commonly known as: ATROVENT TAKE 2.5 MLS (0.5 MG TOTAL) BY NEBULIZATION 4 (FOUR) TIMES DAILY.   Melatonin 10 MG Caps Take 10 mg by mouth at bedtime.   multivitamin with minerals tablet Take 1 tablet by mouth daily.   ondansetron 4 MG/5ML solution Commonly known as: ZOFRAN Take by mouth once.   pantoprazole 40 MG tablet Commonly known as: PROTONIX TAKE 1 TABLET BY MOUTH EVERY DAY   polyethylene glycol 17 g packet Commonly known as: MIRALAX / GLYCOLAX Take 17 g by mouth daily.   SALONPAS PAIN RELIEVING EX Apply 1 Application topically 2 (two) times daily as needed (pain).   HM Lidocaine Patch 4 % Generic drug: lidocaine Place 1 patch onto the skin daily.   traMADol-acetaminophen 37.5-325 MG tablet Commonly known as: ULTRACET Take by mouth.   traZODone 50 MG tablet Commonly known as: DESYREL TAKE  3 TABLETS BY MOUTH AT BEDTIME   VITAMIN B-12 PO Take 1 tablet by mouth daily.        Review of Systems  Constitutional:  Positive for fatigue. Negative for appetite change and fever.  HENT:  Positive for hearing loss. Negative for congestion and trouble swallowing.   Eyes:  Negative for visual disturbance.  Respiratory:  Positive for shortness of breath. Negative for cough and wheezing.   Cardiovascular:  Negative for chest pain, palpitations and leg swelling.  Gastrointestinal:  Negative for abdominal pain, constipation, nausea and vomiting.  Genitourinary:  Negative for dysuria and urgency.  Musculoskeletal:  Positive for arthralgias, back pain and gait problem.  Skin:  Positive for wound.  Neurological:  Negative for dizziness, speech difficulty, weakness and headaches.  Psychiatric/Behavioral:  Positive for confusion. Negative for behavioral problems and sleep disturbance. The patient is not nervous/anxious.     Immunization History  Administered Date(s) Administered   Fluad Quad(high Dose 65+) 03/18/2020   Influenza Split  02/20/2018   Influenza Whole 01/29/2010   Influenza, High Dose Seasonal PF 04/13/2014, 02/12/2016, 03/10/2017, 03/19/2018, 04/11/2019   Influenza-Unspecified 03/16/2014, 03/05/2021   PFIZER(Purple Top)SARS-COV-2 Vaccination 08/08/2019, 08/29/2019, 03/18/2020, 02/26/2021   Pneumococcal Conjugate-13 05/12/2016   Pneumococcal Polysaccharide-23 08/08/1996   Tdap 04/10/2003, 02/12/2022   Zoster Recombinat (Shingrix) 02/22/2019   Pertinent  Health Maintenance Due  Topic Date Due   INFLUENZA VACCINE  01/14/2022   DEXA SCAN  Completed      02/03/2022    8:30 AM 02/03/2022    8:00 PM 02/04/2022    8:00 AM 02/12/2022    3:03 AM 05/10/2022   12:52 AM  Fall Risk  Patient Fall Risk Level High fall risk High fall risk High fall risk High fall risk High fall risk   Functional Status Survey:    Vitals:   05/12/22 1340  BP: (!) 177/81  Pulse: 90  Resp: 20  Temp: 97.6 F (36.4 C)  SpO2: 99%  Weight: 86 lb 9.6 oz (39.3 kg)   Body mass index is 18.1 kg/m. Physical Exam Vitals and nursing note reviewed.  Constitutional:      Comments: frail  HENT:     Head:     Comments: Left forehead contusion    Nose: Nose normal.     Mouth/Throat:     Mouth: Mucous membranes are moist.  Eyes:     Extraocular Movements: Extraocular movements intact.     Conjunctiva/sclera: Conjunctivae normal.     Pupils: Pupils are equal, round, and reactive to light.  Cardiovascular:     Rate and Rhythm: Normal rate and regular rhythm.     Heart sounds: No murmur heard. Pulmonary:     Breath sounds: No wheezing, rhonchi or rales.     Comments: O2 via [facility] Abdominal:     Palpations: Abdomen is soft.     Tenderness: There is no abdominal tenderness.  Musculoskeletal:     Cervical back: Normal range of motion and neck supple.     Right lower leg: No edema.     Left lower leg: No edema.     Comments: Left wrist splint.   Skin:    General: Skin is warm and dry.     Findings: Bruising present.     Comments:  Left forehead hematoma/contusion, no active bleeding or s/s of infection.   Neurological:     General: No focal deficit present.     Mental Status: She is alert.     Motor: No  weakness.     Coordination: Coordination normal.     Gait: Gait abnormal.     Comments: Oriented to person, her room on unit.   Psychiatric:        Mood and Affect: Mood normal.     Labs reviewed: Recent Labs    10/12/21 0318 10/24/21 0023 01/29/22 2306 01/30/22 1610 01/31/22 2244 02/01/22 0237 02/02/22 0801 04/10/22 1410  NA 142   < > 140   < > 137  --  136 142  K 3.6   < > 2.9*   < > 4.1  --  3.9 4.1  CL 109   < > 100   < > 102  --  105 103  CO2 28   < > 31   < > 28  --  22 26  GLUCOSE 87   < > 128*   < > 80  --  80 95  BUN 14   < > 17   < > 19  --  19 18  CREATININE 0.95   < > 1.11*   < > 1.30*  --  1.10* 0.92  CALCIUM 8.8*   < > 9.1   < > 8.9  --  8.6* 9.6  MG 2.3  --  1.7  --   --  2.1  --   --   PHOS  --   --  2.4*  --   --  4.1  --   --    < > = values in this interval not displayed.   Recent Labs    10/12/21 0318 11/28/21 1106 01/08/22 1521 04/10/22 1410  AST '26 22 26 24  '$ ALT '15 10 14 13  '$ ALKPHOS 42  --  49 76  BILITOT 0.7 0.3 0.3 <0.2  PROT 5.5* 6.5 5.7* 6.1  ALBUMIN 3.0*  --  3.0* 3.8   Recent Labs    01/08/22 1521 01/29/22 2306 01/30/22 1610 02/01/22 0237 02/02/22 0801 04/10/22 1410  WBC 7.3 10.3   < > 7.0 6.9 6.3  NEUTROABS 5.1 8.6*  --  4.2  --   --   HGB 9.7* 10.9*   < > 10.3* 10.0* 10.2*  HCT 30.6* 34.4*   < > 32.1* 32.4* 31.4*  MCV 93.6 92.2   < > 92.8 94.2 91  PLT 171 169   < > 180 187 231   < > = values in this interval not displayed.   Lab Results  Component Value Date   TSH 2.88 11/28/2021   Lab Results  Component Value Date   HGBA1C 5.2 07/09/2017   Lab Results  Component Value Date   CHOL 206 (H) 11/28/2021   HDL 68 11/28/2021   LDLCALC 115 (H) 11/28/2021   TRIG 124 11/28/2021   CHOLHDL 3.0 11/28/2021    Significant Diagnostic Results in  last 30 days:  CT Head Wo Contrast  Result Date: 05/10/2022 CLINICAL DATA:  Multiple recent falls with frontal hematoma, initial encounter EXAM: CT HEAD WITHOUT CONTRAST TECHNIQUE: Contiguous axial images were obtained from the base of the skull through the vertex without intravenous contrast. RADIATION DOSE REDUCTION: This exam was performed according to the departmental dose-optimization program which includes automated exposure control, adjustment of the mA and/or kV according to patient size and/or use of iterative reconstruction technique. COMPARISON:  02/12/2022 FINDINGS: Brain: No evidence of acute infarction, hemorrhage, hydrocephalus, extra-axial collection or mass lesion/mass effect. Chronic atrophic and ischemic changes are noted. Vascular: No hyperdense vessel or unexpected  calcification. Skull: Normal. Negative for fracture or focal lesion. Sinuses/Orbits: No acute finding. Other: Left frontal hematoma is noted consistent with the recent injury. IMPRESSION: Left frontal scalp hematoma. Chronic atrophic and ischemic changes without acute abnormality. Electronically Signed   By: Inez Catalina M.D.   On: 05/10/2022 02:37    Assessment/Plan: Traumatic hematoma of forehead fall when the patient attempted transferring self w/o assistance,  struck forehead, resulted forehead contusion. CT head 05/10/22 Left frontal scalp hematoma. Chronic atrophic and ischemic changes without acute abnormality. Continue to monitor s/s of neurological symptoms.   GE reflux takes Pantoprazolem Hgb 19,2 04/10/22  Slow transit constipation  takes MiraLax, stable.   Vascular dementia ([place])  takes ASA, Donepezil,  CT head 05/10/22 Left frontal scalp hematoma. Chronic atrophic and ischemic changes without acute abnormality.  Osteoarthritis, multiple sites Hx of compression fx, managed with Tramadol, Tylenol, Gabapentin. 04/24/22 Ortho R hand, brace prn, finger ROM only.   COPD, severe (HCC) COPD, hypoxia, chronic  respiratory failure, followed by Pulmonology, takes Atrovent Neb, Pulmicort, prn Albuterol. O2  Recurrent depression (HCC)  takes Lexapro, Trazodone, prn Alprazolam.     Gait abnormality Needs assistance for transfer, ambulation. Risk for fall. Close supervision and assistance for safety  Elevated systolic blood pressure reading without diagnosis of hypertension Asymptomatic, monitor Bp daily.     Family/ staff Communication: plan of care reviewed with the patient and charge nurse.   Labs/tests ordered:  none  Time spend 35 minutes.

## 2022-05-12 NOTE — Assessment & Plan Note (Signed)
takes Lexapro, Trazodone, prn Alprazolam.

## 2022-05-12 NOTE — Assessment & Plan Note (Signed)
COPD, hypoxia, chronic respiratory failure, followed by Pulmonology, takes Atrovent Neb, Pulmicort, prn Albuterol. O2

## 2022-05-12 NOTE — Assessment & Plan Note (Signed)
Asymptomatic, monitor Bp daily.

## 2022-05-12 NOTE — Assessment & Plan Note (Signed)
Needs assistance for transfer, ambulation. Risk for fall. Close supervision and assistance for safety

## 2022-05-12 NOTE — Assessment & Plan Note (Signed)
fall when the patient attempted transferring self w/o assistance,  struck forehead, resulted forehead contusion. CT head 05/10/22 Left frontal scalp hematoma. Chronic atrophic and ischemic changes without acute abnormality. Continue to monitor s/s of neurological symptoms.

## 2022-05-12 NOTE — Assessment & Plan Note (Signed)
takes MiraLax, stable.

## 2022-05-12 NOTE — Assessment & Plan Note (Addendum)
Hx of compression fx, managed with Tramadol, Tylenol, Gabapentin. 04/24/22 Ortho R hand, brace prn, finger ROM only.

## 2022-05-14 DIAGNOSIS — R41841 Cognitive communication deficit: Secondary | ICD-10-CM | POA: Diagnosis not present

## 2022-05-14 DIAGNOSIS — M6281 Muscle weakness (generalized): Secondary | ICD-10-CM | POA: Diagnosis not present

## 2022-05-14 DIAGNOSIS — R2689 Other abnormalities of gait and mobility: Secondary | ICD-10-CM | POA: Diagnosis not present

## 2022-05-14 DIAGNOSIS — R1312 Dysphagia, oropharyngeal phase: Secondary | ICD-10-CM | POA: Diagnosis not present

## 2022-05-14 DIAGNOSIS — R278 Other lack of coordination: Secondary | ICD-10-CM | POA: Diagnosis not present

## 2022-05-15 DIAGNOSIS — R1312 Dysphagia, oropharyngeal phase: Secondary | ICD-10-CM | POA: Diagnosis not present

## 2022-05-15 DIAGNOSIS — M6281 Muscle weakness (generalized): Secondary | ICD-10-CM | POA: Diagnosis not present

## 2022-05-15 DIAGNOSIS — R2689 Other abnormalities of gait and mobility: Secondary | ICD-10-CM | POA: Diagnosis not present

## 2022-05-15 DIAGNOSIS — R41841 Cognitive communication deficit: Secondary | ICD-10-CM | POA: Diagnosis not present

## 2022-05-15 DIAGNOSIS — R278 Other lack of coordination: Secondary | ICD-10-CM | POA: Diagnosis not present

## 2022-05-16 DIAGNOSIS — R278 Other lack of coordination: Secondary | ICD-10-CM | POA: Diagnosis not present

## 2022-05-16 DIAGNOSIS — M6281 Muscle weakness (generalized): Secondary | ICD-10-CM | POA: Diagnosis not present

## 2022-05-16 DIAGNOSIS — R1312 Dysphagia, oropharyngeal phase: Secondary | ICD-10-CM | POA: Diagnosis not present

## 2022-05-16 DIAGNOSIS — R41841 Cognitive communication deficit: Secondary | ICD-10-CM | POA: Diagnosis not present

## 2022-05-16 DIAGNOSIS — R2689 Other abnormalities of gait and mobility: Secondary | ICD-10-CM | POA: Diagnosis not present

## 2022-05-19 DIAGNOSIS — M6281 Muscle weakness (generalized): Secondary | ICD-10-CM | POA: Diagnosis not present

## 2022-05-19 DIAGNOSIS — R2689 Other abnormalities of gait and mobility: Secondary | ICD-10-CM | POA: Diagnosis not present

## 2022-05-19 DIAGNOSIS — R41841 Cognitive communication deficit: Secondary | ICD-10-CM | POA: Diagnosis not present

## 2022-05-19 DIAGNOSIS — R1312 Dysphagia, oropharyngeal phase: Secondary | ICD-10-CM | POA: Diagnosis not present

## 2022-05-19 DIAGNOSIS — R278 Other lack of coordination: Secondary | ICD-10-CM | POA: Diagnosis not present

## 2022-05-20 DIAGNOSIS — R278 Other lack of coordination: Secondary | ICD-10-CM | POA: Diagnosis not present

## 2022-05-20 DIAGNOSIS — R41841 Cognitive communication deficit: Secondary | ICD-10-CM | POA: Diagnosis not present

## 2022-05-20 DIAGNOSIS — M6281 Muscle weakness (generalized): Secondary | ICD-10-CM | POA: Diagnosis not present

## 2022-05-20 DIAGNOSIS — R2689 Other abnormalities of gait and mobility: Secondary | ICD-10-CM | POA: Diagnosis not present

## 2022-05-20 DIAGNOSIS — R1312 Dysphagia, oropharyngeal phase: Secondary | ICD-10-CM | POA: Diagnosis not present

## 2022-05-22 DIAGNOSIS — R1312 Dysphagia, oropharyngeal phase: Secondary | ICD-10-CM | POA: Diagnosis not present

## 2022-05-22 DIAGNOSIS — R278 Other lack of coordination: Secondary | ICD-10-CM | POA: Diagnosis not present

## 2022-05-22 DIAGNOSIS — R2689 Other abnormalities of gait and mobility: Secondary | ICD-10-CM | POA: Diagnosis not present

## 2022-05-22 DIAGNOSIS — M6281 Muscle weakness (generalized): Secondary | ICD-10-CM | POA: Diagnosis not present

## 2022-05-22 DIAGNOSIS — R41841 Cognitive communication deficit: Secondary | ICD-10-CM | POA: Diagnosis not present

## 2022-05-23 ENCOUNTER — Non-Acute Institutional Stay (SKILLED_NURSING_FACILITY): Payer: Medicare PPO | Admitting: Nurse Practitioner

## 2022-05-23 ENCOUNTER — Encounter: Payer: Self-pay | Admitting: Nurse Practitioner

## 2022-05-23 DIAGNOSIS — J449 Chronic obstructive pulmonary disease, unspecified: Secondary | ICD-10-CM

## 2022-05-23 DIAGNOSIS — K5901 Slow transit constipation: Secondary | ICD-10-CM | POA: Diagnosis not present

## 2022-05-23 DIAGNOSIS — F01A0 Vascular dementia, mild, without behavioral disturbance, psychotic disturbance, mood disturbance, and anxiety: Secondary | ICD-10-CM | POA: Diagnosis not present

## 2022-05-23 DIAGNOSIS — F419 Anxiety disorder, unspecified: Secondary | ICD-10-CM

## 2022-05-23 DIAGNOSIS — M159 Polyosteoarthritis, unspecified: Secondary | ICD-10-CM | POA: Diagnosis not present

## 2022-05-23 DIAGNOSIS — R269 Unspecified abnormalities of gait and mobility: Secondary | ICD-10-CM

## 2022-05-23 NOTE — Assessment & Plan Note (Signed)
Stable, takes Pantoprazolem Hgb 10.2 04/10/22

## 2022-05-23 NOTE — Assessment & Plan Note (Signed)
hypoxia, chronic respiratory failure, followed by Pulmonology, takes Atrovent Neb, Pulmicort, prn Albuterol. O2

## 2022-05-23 NOTE — Assessment & Plan Note (Signed)
takes ASA, Donepezil,  CT head 05/10/22 Left frontal scalp hematoma. Chronic atrophic and ischemic changes without acute abnormality.

## 2022-05-23 NOTE — Progress Notes (Unsigned)
Location:  [facility] of Service:  SNF (31) Provider:  Luci Bellucci Otho Darner, NP  Baxley, Cresenciano Lick, MD  Patient Care Team: Elby Showers, MD as PCP - General (Internal Medicine) Burnell Blanks, MD as PCP - Cardiology (Cardiology) Collene Gobble, MD as Consulting Physician (Pulmonary Disease)  Extended Emergency Contact Information Primary Emergency Contact: Novakowski,[name] Address: [address]          [place], [place] [address] Johnnette Litter of [place] Phone: [phone] Mobile Phone: [phone] Relation: Spouse Secondary Emergency Contact: Sunday Shams Mobile Phone: [phone] Relation: Granddaughter  Code Status:  *** Goals of care: Advanced Directive information    05/23/2022    2:38 PM  Advanced Directives  Does Patient Have a Medical Advance Directive? Yes  Type of Advance Directive Out of facility DNR (pink MOST or yellow form)  Does patient want to make changes to medical advance directive? No - Patient declined     Chief Complaint  Patient presents with   Medical Management of Chronic Issues    Patient is being seen for a routine visit   Immunizations    Discussed the need for shingles vaccine    HPI:  Pt is a 88 y.o. female seen today for medical management of chronic diseases.    GERD, takes Pantoprazolem Hgb 10.2 04/10/22             Constipation, takes MiraLax.              Vascular cognitive impairment, takes ASA, Donepezil,  CT head 05/10/22 Left frontal scalp hematoma. Chronic atrophic and ischemic changes without acute abnormality.             Chronic back pain, Hx of compression fx, managed with Tramadol, Tylenol, Gabapentin             COPD, hypoxia, chronic respiratory failure, followed by Pulmonology, takes Atrovent Neb, Pulmicort, prn Albuterol. O2             Depression, takes Lexapro, Trazodone, prn Alprazolam.              Gait abnormality, needs assistance with transfer, ambulates with walker with SBA.   Past  Medical History:  Diagnosis Date   Allergy    Anemia    Arthritis    Asthma    patient denies   B12 deficiency    COPD (chronic obstructive pulmonary disease) ([place])    Depression    not recent   Diverticulosis    Esophageal stricture    Hepatic cyst 01/30/2010   Hiatal hernia 1985   patient unaware   Hx of adenomatous colonic polyps 2006   Hyperlipidemia    IBS (irritable bowel syndrome)    PONV (postoperative nausea and vomiting)    no nausea or vomitting after surgery 07/2014   Positive PPD    PUD (peptic ulcer disease)    S/P TAVR (transcatheter aortic valve replacement) 08/13/2021   s/p TAVR with a 56m Edwards S3UR via the right carotid approach by Dr. MAngelena Form& Dr. BCyndia Bent  Severe aortic stenosis    Past Surgical History:  Procedure Laterality Date   ABDOMINAL HYSTERECTOMY     abnormal bleeding   APPENDECTOMY     BARTHOLIN GLAND CYST EXCISION     x2   BREAST BIOPSY Bilateral    Milk glnd   CATARACT EXTRACTION W/ INTRAOCULAR LENS  IMPLANT, BILATERAL Bilateral    COLONOSCOPY     COLONOSCOPY W/ POLYPECTOMY  HARDWARE REMOVAL N/A 12/13/2015   Procedure: Removal of HARDWARE  Lumbar Five-Sacral One ;  Surgeon: Kary Kos, MD;  Location: [facility];  Service: Neurosurgery;  Laterality: N/A;   IR KYPHO LUMBAR INC FX REDUCE BONE BX UNI/BIL CANNULATION INC/IMAGING  07/13/2019   PERICARDIAL WINDOW  08/13/2021   Procedure: PERICARDIAL WINDOW;  Surgeon: Burnell Blanks, MD;  Location: [facility];  Service: Open Heart Surgery;;   RIGHT/LEFT HEART CATH AND CORONARY ANGIOGRAPHY N/A 07/11/2021   Procedure: RIGHT/LEFT HEART CATH AND CORONARY ANGIOGRAPHY;  Surgeon: Burnell Blanks, MD;  Location: [facility];  Service: Cardiovascular;  Laterality: N/A;   TEE WITHOUT CARDIOVERSION N/A 08/13/2021   Procedure: TRANSESOPHAGEAL ECHOCARDIOGRAM (TEE);  Surgeon: Burnell Blanks, MD;  Location: [facility];  Service: Open Heart Surgery;  Laterality: N/A;   TRANSCATHETER  AORTIC VALVE REPLACEMENT, CAROTID Right 08/13/2021   Procedure: Transcatheter Aortic Valve Replacement-Carotid;  Surgeon: Burnell Blanks, MD;  Location: [facility];  Service: Open Heart Surgery;  Laterality: Right;   UPPER GI ENDOSCOPY     VAGOTOMY AND PYLOROPLASTY     in [place], [place]    No Known Allergies  Outpatient Encounter Medications as of 05/23/2022  Medication Sig   acetaminophen (TYLENOL) 325 MG tablet Take 650 mg by mouth as needed.   albuterol (VENTOLIN HFA) 108 (90 Base) MCG/ACT inhaler INHALE 2 PUFFS INTO THE LUNGS 4 (FOUR) TIMES DAILY AS NEEDED FOR WHEEZING OR SHORTNESS OF BREATH.   ALPRAZolam (XANAX) 0.25 MG tablet Take 1 tablet (0.25 mg total) by mouth at bedtime as needed for anxiety or sleep.   aspirin EC 81 MG tablet Take 81 mg by mouth daily. Swallow whole.   bisacodyl (DULCOLAX) 10 MG suppository Place 1 suppository (10 mg total) rectally every 12 (twelve) hours as needed for moderate constipation or severe constipation.   budesonide (PULMICORT) 0.5 MG/2ML nebulizer solution TAKE 2 MLS (0.5 MG TOTAL) BY NEBULIZATION 2 (TWO) TIMES DAILY.   docusate sodium (COLACE) 100 MG capsule Take 1 capsule (100 mg total) by mouth 2 (two) times daily.   donepezil (ARICEPT) 5 MG tablet Take 1 tablet (5 mg total) by mouth at bedtime.   escitalopram (LEXAPRO) 20 MG tablet Take 1 tablet (20 mg total) by mouth daily.   fluticasone (FLONASE) 50 MCG/ACT nasal spray Place 1 spray into both nostrils daily.   ipratropium (ATROVENT) 0.02 % nebulizer solution TAKE 2.5 MLS (0.5 MG TOTAL) BY NEBULIZATION 4 (FOUR) TIMES DAILY.   lidocaine (HM LIDOCAINE PATCH) 4 % Place 1 patch onto the skin daily.   Multiple Vitamins-Minerals (MULTIVITAMIN WITH MINERALS) tablet Take 1 tablet by mouth daily.   pantoprazole (PROTONIX) 40 MG tablet TAKE 1 TABLET BY MOUTH EVERY DAY   polyethylene glycol (MIRALAX / GLYCOLAX) 17 g packet Take 17 g by mouth daily.   traMADol-acetaminophen (ULTRACET) 37.5-325 MG tablet  Take by mouth.   traZODone (DESYREL) 50 MG tablet TAKE 3 TABLETS BY MOUTH AT BEDTIME   Cyanocobalamin (VITAMIN B-12 PO) Take 1 tablet by mouth daily. (Patient not taking: Reported on 05/23/2022)   diclofenac Sodium (VOLTAREN) 1 % GEL Apply 4 g topically 4 (four) times daily. (Patient not taking: Reported on 05/23/2022)   gabapentin (NEURONTIN) 100 MG capsule Take 1 capsule (100 mg total) by mouth 3 (three) times daily. (Patient not taking: Reported on 05/23/2022)   Lidocaine (SALONPAS PAIN RELIEVING EX) Apply 1 Application topically 2 (two) times daily as needed (pain). (Patient not taking: Reported on 05/23/2022)   Melatonin  10 MG CAPS Take 10 mg by mouth at bedtime. (Patient not taking: Reported on 05/23/2022)   ondansetron (ZOFRAN) 4 MG/5ML solution Take by mouth once. (Patient not taking: Reported on 05/23/2022)   No facility-administered encounter medications on file as of 05/23/2022.    Review of Systems  Constitutional:  Positive for fatigue. Negative for appetite change and fever.  HENT:  Positive for hearing loss. Negative for congestion and trouble swallowing.   Eyes:  Negative for visual disturbance.  Respiratory:  Positive for shortness of breath. Negative for cough and wheezing.   Cardiovascular:  Negative for chest pain, palpitations and leg swelling.  Gastrointestinal:  Negative for abdominal pain, constipation, nausea and vomiting.  Genitourinary:  Negative for dysuria and urgency.  Musculoskeletal:  Positive for arthralgias, back pain and gait problem.  Skin:  Negative for color change.  Neurological:  Negative for dizziness, speech difficulty, weakness and headaches.  Psychiatric/Behavioral:  Positive for confusion. Negative for behavioral problems and sleep disturbance. The patient is not nervous/anxious.     Immunization History  Administered Date(s) Administered   Fluad Quad(high Dose 65+) 03/18/2020, 05/15/2022   Influenza Split 02/20/2018   Influenza Whole 01/29/2010    Influenza, High Dose Seasonal PF 04/13/2014, 02/12/2016, 03/10/2017, 03/19/2018, 04/11/2019   Influenza-Unspecified 03/16/2014, 03/05/2021   Moderna Sars-Covid-2 Vaccination 04/17/2022   PFIZER(Purple Top)SARS-COV-2 Vaccination 08/08/2019, 08/29/2019, 03/18/2020, 02/26/2021   Pneumococcal Conjugate-13 05/12/2016   Pneumococcal Polysaccharide-23 08/08/1996   Tdap 04/10/2003, 02/12/2022   Zoster Recombinat (Shingrix) 02/22/2019   Pertinent  Health Maintenance Due  Topic Date Due   INFLUENZA VACCINE  Completed   DEXA SCAN  Completed      02/04/2022    8:00 AM 02/12/2022    3:03 AM 05/10/2022   12:52 AM 05/12/2022    3:50 PM 05/23/2022    2:38 PM  Fall Risk  Falls in the past year?    1 1  Was there an injury with Fall?    1 1  Fall Risk Category Calculator    3 3  Fall Risk Category    High High  Patient Fall Risk Level High fall risk High fall risk High fall risk High fall risk High fall risk  Patient at Risk for Falls Due to    History of fall(s);Impaired balance/gait Impaired balance/gait;History of fall(s)  Fall risk Follow up    Falls evaluation completed Falls evaluation completed   Functional Status Survey:    Vitals:   05/23/22 1435  BP: 108/62  Pulse: 85  Resp: 18  Temp: (!) 97.5 F (36.4 C)  TempSrc: Temporal  SpO2: 98%  Weight: 84 lb 3.2 oz (38.2 kg)  Height: '4\' 10"'$  (1.473 m)   Body mass index is 17.6 kg/m. Physical Exam Vitals and nursing note reviewed.  Constitutional:      Comments: frail  HENT:     Head:     Comments: Left forehead contusion    Nose: Nose normal.     Mouth/Throat:     Mouth: Mucous membranes are moist.  Eyes:     Extraocular Movements: Extraocular movements intact.     Conjunctiva/sclera: Conjunctivae normal.     Pupils: Pupils are equal, round, and reactive to light.  Cardiovascular:     Rate and Rhythm: Normal rate and regular rhythm.     Heart sounds: No murmur heard. Pulmonary:     Breath sounds: No wheezing, rhonchi or  rales.     Comments: O2 via [place] Abdominal:     Palpations:  Abdomen is soft.     Tenderness: There is no abdominal tenderness.  Musculoskeletal:     Cervical back: Normal range of motion and neck supple.     Right lower leg: No edema.     Left lower leg: No edema.     Comments: Left wrist splint.   Skin:    General: Skin is warm and dry.     Findings: Bruising present.     Comments: Left forehead hematoma/contusion, resolving.   Neurological:     General: No focal deficit present.     Mental Status: She is alert.     Motor: No weakness.     Coordination: Coordination normal.     Gait: Gait abnormal.     Comments: Oriented to person, her room on unit.   Psychiatric:        Mood and Affect: Mood normal.     Labs reviewed: Recent Labs    10/12/21 0318 10/24/21 0023 01/29/22 2306 01/30/22 1610 01/31/22 2244 02/01/22 0237 02/02/22 0801 04/10/22 1410  NA 142   < > 140   < > 137  --  136 142  K 3.6   < > 2.9*   < > 4.1  --  3.9 4.1  CL 109   < > 100   < > 102  --  105 103  CO2 28   < > 31   < > 28  --  22 26  GLUCOSE 87   < > 128*   < > 80  --  80 95  BUN 14   < > 17   < > 19  --  19 18  CREATININE 0.95   < > 1.11*   < > 1.30*  --  1.10* 0.92  CALCIUM 8.8*   < > 9.1   < > 8.9  --  8.6* 9.6  MG 2.3  --  1.7  --   --  2.1  --   --   PHOS  --   --  2.4*  --   --  4.1  --   --    < > = values in this interval not displayed.   Recent Labs    10/12/21 0318 11/28/21 1106 01/08/22 1521 04/10/22 1410  AST '26 22 26 24  '$ ALT '15 10 14 13  '$ ALKPHOS 42  --  49 76  BILITOT 0.7 0.3 0.3 <0.2  PROT 5.5* 6.5 5.7* 6.1  ALBUMIN 3.0*  --  3.0* 3.8   Recent Labs    01/08/22 1521 01/29/22 2306 01/30/22 1610 02/01/22 0237 02/02/22 0801 04/10/22 1410  WBC 7.3 10.3   < > 7.0 6.9 6.3  NEUTROABS 5.1 8.6*  --  4.2  --   --   HGB 9.7* 10.9*   < > 10.3* 10.0* 10.2*  HCT 30.6* 34.4*   < > 32.1* 32.4* 31.4*  MCV 93.6 92.2   < > 92.8 94.2 91  PLT 171 169   < > 180 187 231   < > = values  in this interval not displayed.   Lab Results  Component Value Date   TSH 2.88 11/28/2021   Lab Results  Component Value Date   HGBA1C 5.2 07/09/2017   Lab Results  Component Value Date   CHOL 206 (H) 11/28/2021   HDL 68 11/28/2021   LDLCALC 115 (H) 11/28/2021   TRIG 124 11/28/2021   CHOLHDL 3.0 11/28/2021    Significant Diagnostic Results in last 30  days:  CT Head Wo Contrast  Result Date: 05/10/2022 CLINICAL DATA:  Multiple recent falls with frontal hematoma, initial encounter EXAM: CT HEAD WITHOUT CONTRAST TECHNIQUE: Contiguous axial images were obtained from the base of the skull through the vertex without intravenous contrast. RADIATION DOSE REDUCTION: This exam was performed according to the departmental dose-optimization program which includes automated exposure control, adjustment of the mA and/or kV according to patient size and/or use of iterative reconstruction technique. COMPARISON:  02/12/2022 FINDINGS: Brain: No evidence of acute infarction, hemorrhage, hydrocephalus, extra-axial collection or mass lesion/mass effect. Chronic atrophic and ischemic changes are noted. Vascular: No hyperdense vessel or unexpected calcification. Skull: Normal. Negative for fracture or focal lesion. Sinuses/Orbits: No acute finding. Other: Left frontal hematoma is noted consistent with the recent injury. IMPRESSION: Left frontal scalp hematoma. Chronic atrophic and ischemic changes without acute abnormality. Electronically Signed   By: Inez Catalina M.D.   On: 05/10/2022 02:37    Assessment/Plan COPD, severe (HCC) hypoxia, chronic respiratory failure, followed by Pulmonology, takes Atrovent Neb, Pulmicort, prn Albuterol. O2  Anxiety Her mood is stable, takes Lexapro, Trazodone, prn Alprazolam.   Gait abnormality needs assistance with transfer, ambulates with walker with SBA  Osteoarthritis, multiple sites Hx of compression fx, managed with Tramadol, Tylenol, Gabapentin. 04/24/22 Ortho R  hand, brace prn, finger ROM only.   Vascular dementia ([facility]) takes ASA, Donepezil,  CT head 05/10/22 Left frontal scalp hematoma. Chronic atrophic and ischemic changes without acute abnormality.  Slow transit constipation Stable, takes MiraLax.      Family/ staff Communication: plan of care reviewed with the patient and charge nurse.   Labs/tests ordered:  none  Time spend 35 minutes.

## 2022-05-23 NOTE — Assessment & Plan Note (Signed)
Her mood is stable, takes Lexapro, Trazodone, prn Alprazolam.

## 2022-05-23 NOTE — Assessment & Plan Note (Signed)
needs assistance with transfer, ambulates with walker with SBA

## 2022-05-23 NOTE — Assessment & Plan Note (Signed)
Stable, takes MiraLax.

## 2022-05-23 NOTE — Assessment & Plan Note (Addendum)
Hx of compression fx, managed with Tramadol, Tylenol, Gabapentin. 04/24/22 Ortho R hand, brace prn, finger ROM only.

## 2022-05-25 DIAGNOSIS — M6281 Muscle weakness (generalized): Secondary | ICD-10-CM | POA: Diagnosis not present

## 2022-05-25 DIAGNOSIS — R2689 Other abnormalities of gait and mobility: Secondary | ICD-10-CM | POA: Diagnosis not present

## 2022-05-25 DIAGNOSIS — R278 Other lack of coordination: Secondary | ICD-10-CM | POA: Diagnosis not present

## 2022-05-25 DIAGNOSIS — R41841 Cognitive communication deficit: Secondary | ICD-10-CM | POA: Diagnosis not present

## 2022-05-25 DIAGNOSIS — R1312 Dysphagia, oropharyngeal phase: Secondary | ICD-10-CM | POA: Diagnosis not present

## 2022-05-26 DIAGNOSIS — R2689 Other abnormalities of gait and mobility: Secondary | ICD-10-CM | POA: Diagnosis not present

## 2022-05-26 DIAGNOSIS — M6281 Muscle weakness (generalized): Secondary | ICD-10-CM | POA: Diagnosis not present

## 2022-05-26 DIAGNOSIS — R1312 Dysphagia, oropharyngeal phase: Secondary | ICD-10-CM | POA: Diagnosis not present

## 2022-05-26 DIAGNOSIS — R278 Other lack of coordination: Secondary | ICD-10-CM | POA: Diagnosis not present

## 2022-05-26 DIAGNOSIS — R41841 Cognitive communication deficit: Secondary | ICD-10-CM | POA: Diagnosis not present

## 2022-05-28 DIAGNOSIS — R1312 Dysphagia, oropharyngeal phase: Secondary | ICD-10-CM | POA: Diagnosis not present

## 2022-05-28 DIAGNOSIS — M6281 Muscle weakness (generalized): Secondary | ICD-10-CM | POA: Diagnosis not present

## 2022-05-28 DIAGNOSIS — R2689 Other abnormalities of gait and mobility: Secondary | ICD-10-CM | POA: Diagnosis not present

## 2022-05-28 DIAGNOSIS — R278 Other lack of coordination: Secondary | ICD-10-CM | POA: Diagnosis not present

## 2022-05-28 DIAGNOSIS — R41841 Cognitive communication deficit: Secondary | ICD-10-CM | POA: Diagnosis not present

## 2022-05-30 DIAGNOSIS — R2689 Other abnormalities of gait and mobility: Secondary | ICD-10-CM | POA: Diagnosis not present

## 2022-05-30 DIAGNOSIS — R1312 Dysphagia, oropharyngeal phase: Secondary | ICD-10-CM | POA: Diagnosis not present

## 2022-05-30 DIAGNOSIS — M6281 Muscle weakness (generalized): Secondary | ICD-10-CM | POA: Diagnosis not present

## 2022-05-30 DIAGNOSIS — R41841 Cognitive communication deficit: Secondary | ICD-10-CM | POA: Diagnosis not present

## 2022-05-30 DIAGNOSIS — R278 Other lack of coordination: Secondary | ICD-10-CM | POA: Diagnosis not present

## 2022-06-02 DIAGNOSIS — M6281 Muscle weakness (generalized): Secondary | ICD-10-CM | POA: Diagnosis not present

## 2022-06-02 DIAGNOSIS — R41841 Cognitive communication deficit: Secondary | ICD-10-CM | POA: Diagnosis not present

## 2022-06-02 DIAGNOSIS — R1312 Dysphagia, oropharyngeal phase: Secondary | ICD-10-CM | POA: Diagnosis not present

## 2022-06-02 DIAGNOSIS — R2689 Other abnormalities of gait and mobility: Secondary | ICD-10-CM | POA: Diagnosis not present

## 2022-06-02 DIAGNOSIS — R278 Other lack of coordination: Secondary | ICD-10-CM | POA: Diagnosis not present

## 2022-06-03 ENCOUNTER — Other Ambulatory Visit: Payer: Medicare PPO

## 2022-06-05 ENCOUNTER — Ambulatory Visit: Payer: Medicare PPO | Admitting: Internal Medicine

## 2022-06-05 DIAGNOSIS — R2689 Other abnormalities of gait and mobility: Secondary | ICD-10-CM | POA: Diagnosis not present

## 2022-06-05 DIAGNOSIS — R41841 Cognitive communication deficit: Secondary | ICD-10-CM | POA: Diagnosis not present

## 2022-06-05 DIAGNOSIS — R1312 Dysphagia, oropharyngeal phase: Secondary | ICD-10-CM | POA: Diagnosis not present

## 2022-06-05 DIAGNOSIS — R278 Other lack of coordination: Secondary | ICD-10-CM | POA: Diagnosis not present

## 2022-06-05 DIAGNOSIS — M6281 Muscle weakness (generalized): Secondary | ICD-10-CM | POA: Diagnosis not present

## 2022-06-06 DIAGNOSIS — R278 Other lack of coordination: Secondary | ICD-10-CM | POA: Diagnosis not present

## 2022-06-06 DIAGNOSIS — M6281 Muscle weakness (generalized): Secondary | ICD-10-CM | POA: Diagnosis not present

## 2022-06-06 DIAGNOSIS — R41841 Cognitive communication deficit: Secondary | ICD-10-CM | POA: Diagnosis not present

## 2022-06-06 DIAGNOSIS — R1312 Dysphagia, oropharyngeal phase: Secondary | ICD-10-CM | POA: Diagnosis not present

## 2022-06-06 DIAGNOSIS — R2689 Other abnormalities of gait and mobility: Secondary | ICD-10-CM | POA: Diagnosis not present

## 2022-06-10 DIAGNOSIS — R1312 Dysphagia, oropharyngeal phase: Secondary | ICD-10-CM | POA: Diagnosis not present

## 2022-06-10 DIAGNOSIS — M6281 Muscle weakness (generalized): Secondary | ICD-10-CM | POA: Diagnosis not present

## 2022-06-10 DIAGNOSIS — R41841 Cognitive communication deficit: Secondary | ICD-10-CM | POA: Diagnosis not present

## 2022-06-10 DIAGNOSIS — R2689 Other abnormalities of gait and mobility: Secondary | ICD-10-CM | POA: Diagnosis not present

## 2022-06-10 DIAGNOSIS — R278 Other lack of coordination: Secondary | ICD-10-CM | POA: Diagnosis not present

## 2022-06-13 DIAGNOSIS — R300 Dysuria: Secondary | ICD-10-CM | POA: Diagnosis not present

## 2022-06-20 ENCOUNTER — Non-Acute Institutional Stay (SKILLED_NURSING_FACILITY): Payer: Medicare PPO | Admitting: Nurse Practitioner

## 2022-06-20 ENCOUNTER — Encounter: Payer: Self-pay | Admitting: Nurse Practitioner

## 2022-06-20 DIAGNOSIS — K5901 Slow transit constipation: Secondary | ICD-10-CM

## 2022-06-20 DIAGNOSIS — J449 Chronic obstructive pulmonary disease, unspecified: Secondary | ICD-10-CM | POA: Diagnosis not present

## 2022-06-20 DIAGNOSIS — N390 Urinary tract infection, site not specified: Secondary | ICD-10-CM

## 2022-06-20 DIAGNOSIS — R269 Unspecified abnormalities of gait and mobility: Secondary | ICD-10-CM

## 2022-06-20 DIAGNOSIS — M4316 Spondylolisthesis, lumbar region: Secondary | ICD-10-CM | POA: Diagnosis not present

## 2022-06-20 DIAGNOSIS — F419 Anxiety disorder, unspecified: Secondary | ICD-10-CM

## 2022-06-20 DIAGNOSIS — F01A0 Vascular dementia, mild, without behavioral disturbance, psychotic disturbance, mood disturbance, and anxiety: Secondary | ICD-10-CM | POA: Diagnosis not present

## 2022-06-20 DIAGNOSIS — K219 Gastro-esophageal reflux disease without esophagitis: Secondary | ICD-10-CM

## 2022-06-20 NOTE — Progress Notes (Signed)
Location:   SNF [facility] Room Number: 63A Place of Service:  SNF (31) Provider: Arna Snipe Ommie Degeorge NP  Baxley, Luanna Cole, MD  Patient Care Team: Margaree Mackintosh, MD as PCP - General (Internal Medicine) [name], MD as PCP - Cardiology (Cardiology) [name], MD as Consulting Physician (Pulmonary Disease)  Extended Emergency Contact Information Primary Emergency Contact: Abel,[name] Address: [address]          [place], [place] [address] [name] Home Phone: [phone] Mobile Phone: [phone] Relation: Spouse Secondary Emergency Contact: [name] Mobile Phone: [phone] Relation: Granddaughter  Code Status:  DNR Goals of care: Advanced Directive information    05/23/2022    2:38 PM  Advanced Directives  Does Patient Have a Medical Advance Directive? Yes  Type of Advance Directive Out of facility DNR (pink MOST or yellow form)  Does patient want to make changes to medical advance directive? No - Patient declined     Chief Complaint  Patient presents with  . Medical Management of Chronic Issues    Routine follow up visit.  . Immunizations    Shingrix vaccine, COVID booster due    HPI:  Pt is a 89 y.o. female seen today for medical management of chronic diseases.    UTI: 06/13/22 urine culture Proteus Mirabilis >100,000c/ml, Klebsiella variicola 50-100,000, Augmentin 500mg  bid x 3 days 06/17/21, asymptomatic today.    GERD, takes Pantoprazolem Hgb 10.2 04/10/22             Constipation, takes MiraLax.              Vascular cognitive impairment, takes ASA, Donepezil,  CT head 05/10/22 Left frontal scalp hematoma. Chronic atrophic and ischemic changes without acute abnormality.             Chronic back pain, Hx of compression fx, managed with Tramadol, Tylenol, Gabapentin             COPD, hypoxia, chronic respiratory failure, followed by Pulmonology, takes Atrovent Neb, Pulmicort, prn Albuterol. O2             Depression,  takes Lexapro, Trazodone, prn Alprazolam.              Gait abnormality, needs assistance with transfer, ambulates with walker with SBA.      Past Medical History:  Diagnosis Date  . Allergy   . Anemia   . Arthritis   . Asthma    patient denies  . B12 deficiency   . COPD (chronic obstructive pulmonary disease) (HCC)   . Depression    not recent  . Diverticulosis   . Esophageal stricture   . Hepatic cyst 01/30/2010  . Hiatal hernia 1985   patient unaware  . Hx of adenomatous colonic polyps 2006  . Hyperlipidemia   . IBS (irritable bowel syndrome)   . PONV (postoperative nausea and vomiting)    no nausea or vomitting after surgery 07/2014  . Positive PPD   . PUD (peptic ulcer disease)   . S/P TAVR (transcatheter aortic valve replacement) 08/13/2021   s/p TAVR with a 23mm Edwards S3UR via the right carotid approach by Dr. Clifton James & Dr. Laneta Simmers  . Severe aortic stenosis    Past Surgical History:  Procedure Laterality Date  . ABDOMINAL HYSTERECTOMY     abnormal bleeding  . APPENDECTOMY    . BARTHOLIN GLAND CYST EXCISION     x2  . BREAST BIOPSY Bilateral    Milk glnd  .  CATARACT EXTRACTION W/ INTRAOCULAR LENS  IMPLANT, BILATERAL Bilateral   . COLONOSCOPY    . COLONOSCOPY W/ POLYPECTOMY    . HARDWARE REMOVAL N/A 12/13/2015   Procedure: Removal of HARDWARE  Lumbar Five-Sacral One ;  Surgeon: Donalee Citrin, MD;  Location: [facility];  Service: Neurosurgery;  Laterality: N/A;  . IR KYPHO LUMBAR INC FX REDUCE BONE BX UNI/BIL CANNULATION INC/IMAGING  07/13/2019  . PERICARDIAL WINDOW  08/13/2021   Procedure: PERICARDIAL WINDOW;  Surgeon: [name], MD;  Location: [facility] OR;  Service: Open Heart Surgery;;  . RIGHT/LEFT HEART CATH AND CORONARY ANGIOGRAPHY N/A 07/11/2021   Procedure: RIGHT/LEFT HEART CATH AND CORONARY ANGIOGRAPHY;  Surgeon: [name], MD;  Location: [facility] INVASIVE CV LAB;  Service: Cardiovascular;  Laterality: N/A;  . TEE WITHOUT CARDIOVERSION N/A  08/13/2021   Procedure: TRANSESOPHAGEAL ECHOCARDIOGRAM (TEE);  Surgeon: [name], MD;  Location: [facility] OR;  Service: Open Heart Surgery;  Laterality: N/A;  . TRANSCATHETER AORTIC VALVE REPLACEMENT, CAROTID Right 08/13/2021   Procedure: Transcatheter Aortic Valve Replacement-Carotid;  Surgeon: [name], MD;  Location: [facility] OR;  Service: Open Heart Surgery;  Laterality: Right;  . UPPER GI ENDOSCOPY    . VAGOTOMY AND PYLOROPLASTY     in [place], [place]    No Known Allergies  Allergies as of 06/20/2022   No Known Allergies      Medication List        Accurate as of June 20, 2022 11:59 PM. If you have any questions, ask your nurse or doctor.          STOP taking these medications    diclofenac Sodium 1 % Gel Commonly known as: VOLTAREN Stopped by: Kenika Sahm X Jahred Tatar, NP   gabapentin 100 MG capsule Commonly known as: NEURONTIN Stopped by: Ashten Prats X Ionia Schey, NP   Melatonin 10 MG Caps Stopped by: Trygg Mantz X Kamaljit Hizer, NP   ondansetron 4 MG/5ML solution Commonly known as: ZOFRAN Stopped by: Sarha Bartelt X Ambreen Tufte, NP   VITAMIN B-12 PO Stopped by: Kawthar Ennen X Malyssa Maris, NP       TAKE these medications    acetaminophen 500 MG tablet Commonly known as: TYLENOL Take 500 mg by mouth 3 (three) times daily. What changed: Another medication with the same name was removed. Continue taking this medication, and follow the directions you see here. Changed by: Zyana Amaro X Navi Erber, NP   albuterol 108 (90 Base) MCG/ACT inhaler Commonly known as: VENTOLIN HFA INHALE 2 PUFFS INTO THE LUNGS 4 (FOUR) TIMES DAILY AS NEEDED FOR WHEEZING OR SHORTNESS OF BREATH.   ALPRAZolam 0.25 MG tablet Commonly known as: XANAX Take 1 tablet (0.25 mg total) by mouth at bedtime as needed for anxiety or sleep. What changed: when to take this   aspirin EC 81 MG tablet Take 81 mg by mouth daily. Swallow whole.   Biofreeze 4 % Gel Generic drug: Menthol (Topical Analgesic) Apply topically. Apply to affected areas topically as  needed for Pain Bio-freeze gel 4% 2 grams topical BID PRN   bisacodyl 10 MG suppository Commonly known as: DULCOLAX Place 1 suppository (10 mg total) rectally every 12 (twelve) hours as needed for moderate constipation or severe constipation.   budesonide 0.5 MG/2ML nebulizer solution Commonly known as: PULMICORT TAKE 2 MLS (0.5 MG TOTAL) BY NEBULIZATION 2 (TWO) TIMES DAILY.   docusate sodium 100 MG capsule Commonly known as: COLACE Take 1 capsule (100 mg total) by mouth 2 (two) times daily.   donepezil 5 MG tablet Commonly known  as: ARICEPT Take 1 tablet (5 mg total) by mouth at bedtime.   escitalopram 5 MG tablet Commonly known as: LEXAPRO Take 20 mg by mouth daily. What changed: Another medication with the same name was removed. Continue taking this medication, and follow the directions you see here. Changed by: Donnie Gedeon X Dakotah Orrego, NP   feeding supplement Liqd Take 237 mLs by mouth 2 (two) times daily.   ferrous sulfate 325 (65 FE) MG EC tablet Take 325 mg by mouth 3 (three) times daily with meals.   fluticasone 50 MCG/ACT nasal spray Commonly known as: FLONASE Place 1 spray into both nostrils daily.   ipratropium 0.02 % nebulizer solution Commonly known as: ATROVENT TAKE 2.5 MLS (0.5 MG TOTAL) BY NEBULIZATION 4 (FOUR) TIMES DAILY.   multivitamin with minerals tablet Take 1 tablet by mouth daily.   pantoprazole 40 MG tablet Commonly known as: PROTONIX TAKE 1 TABLET BY MOUTH EVERY DAY   polyethylene glycol 17 g packet Commonly known as: MIRALAX / GLYCOLAX Take 17 g by mouth daily.   promethazine 25 MG tablet Commonly known as: PHENERGAN Take 25 mg by mouth every 6 (six) hours as needed for nausea or vomiting.   SALONPAS PAIN RELIEVING EX Apply 1 Application topically 2 (two) times daily as needed (pain). What changed: Another medication with the same name was removed. Continue taking this medication, and follow the directions you see here. Changed by: Louanne Calvillo X Subrena Devereux,  NP   traMADol-acetaminophen 37.5-325 MG tablet Commonly known as: ULTRACET Take by mouth.   traZODone 150 MG tablet Commonly known as: DESYREL Take 150 mg by mouth at bedtime. Give 0.5 tablet What changed: Another medication with the same name was removed. Continue taking this medication, and follow the directions you see here. Changed by: Adya Wirz X Jonia Oakey, NP        Review of Systems  Constitutional:  Positive for fatigue. Negative for appetite change and fever.  HENT:  Positive for hearing loss. Negative for congestion and trouble swallowing.   Eyes:  Negative for visual disturbance.  Respiratory:  Positive for shortness of breath. Negative for cough and wheezing.   Cardiovascular:  Negative for leg swelling.  Gastrointestinal:  Negative for abdominal pain, constipation, nausea and vomiting.  Genitourinary:  Negative for dysuria and urgency.  Musculoskeletal:  Positive for arthralgias, back pain and gait problem.  Skin:  Negative for color change.  Neurological:  Negative for dizziness, speech difficulty, weakness and headaches.  Psychiatric/Behavioral:  Positive for confusion. Negative for behavioral problems and sleep disturbance. The patient is not nervous/anxious.     Immunization History  Administered Date(s) Administered  . Fluad Quad(high Dose 65+) 03/18/2020, 05/15/2022  . Influenza Split 02/20/2018  . Influenza Whole 01/29/2010  . Influenza, High Dose Seasonal PF 04/13/2014, 02/12/2016, 03/10/2017, 03/19/2018, 04/11/2019, 05/15/2022  . Influenza-Unspecified 03/16/2014, 03/05/2021  . Moderna Sars-Covid-2 Vaccination 04/17/2022  . PFIZER(Purple Top)SARS-COV-2 Vaccination 08/08/2019, 08/29/2019, 03/18/2020, 02/26/2021  . Pneumococcal Conjugate-13 05/12/2016  . Pneumococcal Polysaccharide-23 08/08/1996  . Tdap 04/10/2003, 02/12/2022  . Zoster Recombinat (Shingrix) 02/22/2019   Pertinent  Health Maintenance Due  Topic Date Due  . INFLUENZA VACCINE  Completed  . DEXA  SCAN  Completed      02/04/2022    8:00 AM 02/12/2022    3:03 AM 05/10/2022   12:52 AM 05/12/2022    3:50 PM 05/23/2022    2:38 PM  Fall Risk  Falls in the past year?    1 1  Was there an injury with Fall?  1 1  Fall Risk Category Calculator    3 3  Fall Risk Category    High High  Patient Fall Risk Level High fall risk High fall risk High fall risk High fall risk High fall risk  Patient at Risk for Falls Due to    History of fall(s);Impaired balance/gait Impaired balance/gait;History of fall(s)  Fall risk Follow up    Falls evaluation completed Falls evaluation completed   Functional Status Survey:    Vitals:   06/20/22 1451  BP: 120/66  Pulse: 78  SpO2: 98%  Weight: 83 lb 9.6 oz (37.9 kg)  Height: 4\' 10"  (1.473 m)   Body mass index is 17.47 kg/m. Physical Exam Vitals and nursing note reviewed.  Constitutional:      Comments: frail  HENT:     Head: Normocephalic and atraumatic.     Nose: Nose normal.     Mouth/Throat:     Mouth: Mucous membranes are moist.  Eyes:     Extraocular Movements: Extraocular movements intact.     Conjunctiva/sclera: Conjunctivae normal.     Pupils: Pupils are equal, round, and reactive to light.  Cardiovascular:     Rate and Rhythm: Normal rate and regular rhythm.     Heart sounds: No murmur heard. Pulmonary:     Breath sounds: No wheezing, rhonchi or rales.     Comments: O2 via [place] Abdominal:     Palpations: Abdomen is soft.     Tenderness: There is no abdominal tenderness.  Musculoskeletal:     Cervical back: Normal range of motion and neck supple.     Right lower leg: No edema.     Left lower leg: No edema.     Comments: Left wrist splint.   Skin:    General: Skin is warm and dry.     Findings: Bruising present.     Comments: Left forehead hematoma/contusion, resolving.   Neurological:     General: No focal deficit present.     Mental Status: She is alert.     Motor: No weakness.     Coordination: Coordination normal.      Gait: Gait abnormal.     Comments: Oriented to person, her room on unit.   Psychiatric:        Mood and Affect: Mood normal.    Labs reviewed: Recent Labs    10/12/21 0318 10/24/21 0023 01/29/22 2306 01/30/22 1610 01/31/22 2244 02/01/22 0237 02/02/22 0801 04/10/22 1410  NA 142   < > 140   < > 137  --  136 142  K 3.6   < > 2.9*   < > 4.1  --  3.9 4.1  CL 109   < > 100   < > 102  --  105 103  CO2 28   < > 31   < > 28  --  22 26  GLUCOSE 87   < > 128*   < > 80  --  80 95  BUN 14   < > 17   < > 19  --  19 18  CREATININE 0.95   < > 1.11*   < > 1.30*  --  1.10* 0.92  CALCIUM 8.8*   < > 9.1   < > 8.9  --  8.6* 9.6  MG 2.3  --  1.7  --   --  2.1  --   --   PHOS  --   --  2.4*  --   --  4.1  --   --    < > = values in this interval not displayed.   Recent Labs    10/12/21 0318 11/28/21 1106 01/08/22 1521 04/10/22 1410  AST 26 22 26 24   ALT 15 10 14 13   ALKPHOS 42  --  49 76  BILITOT 0.7 0.3 0.3 <0.2  PROT 5.5* 6.5 5.7* 6.1  ALBUMIN 3.0*  --  3.0* 3.8   Recent Labs    01/08/22 1521 01/29/22 2306 01/30/22 1610 02/01/22 0237 02/02/22 0801 04/10/22 1410  WBC 7.3 10.3   < > 7.0 6.9 6.3  NEUTROABS 5.1 8.6*  --  4.2  --   --   HGB 9.7* 10.9*   < > 10.3* 10.0* 10.2*  HCT 30.6* 34.4*   < > 32.1* 32.4* 31.4*  MCV 93.6 92.2   < > 92.8 94.2 91  PLT 171 169   < > 180 187 231   < > = values in this interval not displayed.   Lab Results  Component Value Date   TSH 2.88 11/28/2021   Lab Results  Component Value Date   HGBA1C 5.2 07/09/2017   Lab Results  Component Value Date   CHOL 206 (H) 11/28/2021   HDL 68 11/28/2021   LDLCALC 115 (H) 11/28/2021   TRIG 124 11/28/2021   CHOLHDL 3.0 11/28/2021    Significant Diagnostic Results in last 30 days:  No results found.  Assessment/Plan  UTI (urinary tract infection) 06/13/22 urine culture Proteus Mirabilis >100,000c/ml, Klebsiella variicola 50-100,000, Augmentin 500mg  bid x 3 days 06/17/21, asymptomatic today.   GE  reflux Stable,  takes Pantoprazolem Hgb 10.2 04/10/22  Slow transit constipation Stable,  takes MiraLax.   Vascular dementia [facility]) Resides in SN[facility] for supportive care,  takes ASA, Donepezil,  CT head 05/10/22 Left frontal scalp hematoma. Chronic atrophic and ischemic changes without acute abnormality.  Spondylolisthesis at L4-L5 level Hx of compression fx, managed with Tramadol, Tylenol, Gabapentin  COPD, severe (HCC) hypoxia, chronic respiratory failure, followed by Pulmonology, takes Atrovent Neb, Pulmicort, prn Albuterol. O2  Anxiety Her mood is stable,  takes Lexapro, Trazodone, prn Alprazolam.   Gait abnormality needs assistance with transfer, ambulates with walker with SBA.      Family/ staff Communication: plan of care reviewed with the patient and charge nurse.   Labs/tests ordered:  none  Time spend 35 minutes.

## 2022-06-20 NOTE — Assessment & Plan Note (Signed)
needs assistance with transfer, ambulates with walker with SBA.

## 2022-06-20 NOTE — Assessment & Plan Note (Signed)
Resides in SNF [facility] for supportive care,  takes ASA, Donepezil,  CT head 05/10/22 Left frontal scalp hematoma. Chronic atrophic and ischemic changes without acute abnormality.

## 2022-06-20 NOTE — Assessment & Plan Note (Signed)
Stable,  takes MiraLax.

## 2022-06-20 NOTE — Assessment & Plan Note (Signed)
hypoxia, chronic respiratory failure, followed by Pulmonology, takes Atrovent Neb, Pulmicort, prn Albuterol. O2

## 2022-06-20 NOTE — Assessment & Plan Note (Signed)
06/13/22 urine culture Proteus Mirabilis >100,000c/ml, Klebsiella variicola 50-100,000, Augmentin '500mg'$  bid x 3 days 06/17/21, asymptomatic today.

## 2022-06-20 NOTE — Assessment & Plan Note (Signed)
Stable,  takes Pantoprazolem Hgb 10.2 04/10/22

## 2022-06-20 NOTE — Assessment & Plan Note (Signed)
Her mood is stable,  takes Lexapro, Trazodone, prn Alprazolam.

## 2022-06-20 NOTE — Assessment & Plan Note (Signed)
Hx of compression fx, managed with Tramadol, Tylenol, Gabapentin

## 2022-06-23 ENCOUNTER — Encounter: Payer: Self-pay | Admitting: Nurse Practitioner

## 2022-06-25 ENCOUNTER — Other Ambulatory Visit: Payer: Self-pay | Admitting: Adult Health

## 2022-06-25 MED ORDER — TRAMADOL-ACETAMINOPHEN 37.5-325 MG PO TABS: 1.0000 | ORAL_TABLET | Freq: Three times a day (TID) | ORAL | 0 refills | Status: DC

## 2022-07-01 ENCOUNTER — Non-Acute Institutional Stay (SKILLED_NURSING_FACILITY): Payer: Medicare PPO | Admitting: Family Medicine

## 2022-07-01 DIAGNOSIS — F01A0 Vascular dementia, mild, without behavioral disturbance, psychotic disturbance, mood disturbance, and anxiety: Secondary | ICD-10-CM

## 2022-07-01 DIAGNOSIS — R269 Unspecified abnormalities of gait and mobility: Secondary | ICD-10-CM | POA: Diagnosis not present

## 2022-07-01 NOTE — Progress Notes (Signed)
Provider:  Alain Honey, MD Location:      Place of Service:     PCP: Elby Showers, MD Patient Care Team: Elby Showers, MD as PCP - General (Internal Medicine) Burnell Blanks, MD as PCP - Cardiology (Cardiology) Collene Gobble, MD as Consulting Physician (Pulmonary Disease)  Extended Emergency Contact Information Primary Emergency Contact: Alsop,[name] Address: [address]          [place], [place] [address] Johnnette Litter of [place] Phone: [phone] Mobile Phone: [phone] Relation: Spouse Secondary Emergency Contact: Sunday Shams Mobile Phone: [phone] Relation: Granddaughter  Code Status:  Goals of Care: Advanced Directive information    05/23/2022    2:38 PM  Advanced Directives  Does Patient Have a Medical Advance Directive? Yes  Type of Advance Directive Out of facility DNR (pink MOST or yellow form)  Does patient want to make changes to medical advance directive? No - Patient declined      No chief complaint on file.   HPI: Patient is a 89 y.o. female seen today for agitation associated with dementia.  Her husband requested help with agitation.  He comes twice a day to visit to assist her with meals.  Frequently she is agitated, cursing him, throwing tea and ham.  He realizes that this is the result of her dementia and request some help.  She was frequently treated for falls as she lacks awareness of precautions to take for her safety.  She does not understand why she cannot go home with him. Review of her medicines reveals Lexapro 20 mg a day, Aricept 5 mg a day alprazolam 0.25 mg as needed.  She has seen a neurologist in the past who recommended the Aricept and increasing the Lexapro. She was admitted here a little over 2 months ago.  She has a history of COPD on supplemental oxygen, valvular heart disease depression and dementia.  She also has chronic pain that is managed with tramadol and Tylenol  Past Medical History:  Diagnosis  Date   Allergy    Anemia    Arthritis    Asthma    patient denies   B12 deficiency    COPD (chronic obstructive pulmonary disease) ([place])    Depression    not recent   Diverticulosis    Esophageal stricture    Hepatic cyst 01/30/2010   Hiatal hernia 1985   patient unaware   Hx of adenomatous colonic polyps 2006   Hyperlipidemia    IBS (irritable bowel syndrome)    PONV (postoperative nausea and vomiting)    no nausea or vomitting after surgery 07/2014   Positive PPD    PUD (peptic ulcer disease)    S/P TAVR (transcatheter aortic valve replacement) 08/13/2021   s/p TAVR with a 77m Edwards S3UR via the right carotid approach by Dr. MAngelena Form& Dr. BCyndia Bent  Severe aortic stenosis    Past Surgical History:  Procedure Laterality Date   ABDOMINAL HYSTERECTOMY     abnormal bleeding   APPENDECTOMY     BARTHOLIN GLAND CYST EXCISION     x2   BREAST BIOPSY Bilateral    Milk glnd   CATARACT EXTRACTION W/ INTRAOCULAR LENS  IMPLANT, BILATERAL Bilateral    COLONOSCOPY     COLONOSCOPY W/ POLYPECTOMY     HARDWARE REMOVAL N/A 12/13/2015   Procedure: Removal of HARDWARE  Lumbar Five-Sacral One ;  Surgeon: GKary Kos MD;  Location: [facility];  Service: Neurosurgery;  Laterality: N/A;   IR  KYPHO LUMBAR INC FX REDUCE BONE BX UNI/BIL CANNULATION INC/IMAGING  07/13/2019   PERICARDIAL WINDOW  08/13/2021   Procedure: PERICARDIAL WINDOW;  Surgeon: Burnell Blanks, MD;  Location: [facility];  Service: Open Heart Surgery;;   RIGHT/LEFT HEART CATH AND CORONARY ANGIOGRAPHY N/A 07/11/2021   Procedure: RIGHT/LEFT HEART CATH AND CORONARY ANGIOGRAPHY;  Surgeon: Burnell Blanks, MD;  Location: [facility];  Service: Cardiovascular;  Laterality: N/A;   TEE WITHOUT CARDIOVERSION N/A 08/13/2021   Procedure: TRANSESOPHAGEAL ECHOCARDIOGRAM (TEE);  Surgeon: Burnell Blanks, MD;  Location: [facility];  Service: Open Heart Surgery;  Laterality: N/A;   TRANSCATHETER AORTIC VALVE REPLACEMENT,  CAROTID Right 08/13/2021   Procedure: Transcatheter Aortic Valve Replacement-Carotid;  Surgeon: Burnell Blanks, MD;  Location: [facility];  Service: Open Heart Surgery;  Laterality: Right;   UPPER GI ENDOSCOPY     VAGOTOMY AND PYLOROPLASTY     in [place], [place]    reports that she quit smoking about 12 years ago. Her smoking use included cigarettes. She has never used smokeless tobacco. She reports that she does not drink alcohol and does not use drugs. Social History   Socioeconomic History   Marital status: Married    Spouse name: Not on file   Number of children: 2   Years of education: Not on file   Highest education level: Not on file  Occupational History    Employer: RETIRED   Occupation: Retired-office work  Tobacco Use   Smoking status: Former    Years: 60.00    Types: Cigarettes    Quit date: 02/02/2010    Years since quitting: 12.4   Smokeless tobacco: Never  Vaping Use   Vaping Use: Never used  Substance and Sexual Activity   Alcohol use: No    Comment: quit in 1983   Drug use: No   Sexual activity: Not Currently  Other Topics Concern   Not on file  Social History Narrative   Daily caffeine    Social Determinants of Health   Financial Resource Strain: Not on file  Food Insecurity: Not on file  Transportation Needs: Not on file  Physical Activity: Not on file  Stress: Not on file  Social Connections: Not on file  Intimate Partner Violence: Not on file    Functional Status Survey:    Family History  Problem Relation Age of Onset   Ulcers Father    Stroke Father    Irritable bowel syndrome Father    Ovarian cancer Sister    Colon cancer Neg Hx     Health Maintenance  Topic Date Due   Zoster Vaccines- Shingrix (2 of 2) 04/19/2019   COVID-19 Vaccine (6 - 2023-24 season) 06/12/2022   Medicare Annual Wellness (AWV)  12/04/2022   DTaP/Tdap/Td (3 - Td or Tdap) 02/13/2032   Pneumonia Vaccine 65+ Years old  Completed   INFLUENZA VACCINE  Completed    DEXA SCAN  Completed   HPV VACCINES  Aged Out    No Known Allergies  Outpatient Encounter Medications as of 07/01/2022  Medication Sig   acetaminophen (TYLENOL) 500 MG tablet Take 500 mg by mouth 3 (three) times daily.   albuterol (VENTOLIN HFA) 108 (90 Base) MCG/ACT inhaler INHALE 2 PUFFS INTO THE LUNGS 4 (FOUR) TIMES DAILY AS NEEDED FOR WHEEZING OR SHORTNESS OF BREATH.   ALPRAZolam (XANAX) 0.25 MG tablet Take 1 tablet (0.25 mg total) by mouth at bedtime as needed for anxiety or sleep. (Patient taking differently: Take 0.25 mg  by mouth at bedtime.)   aspirin EC 81 MG tablet Take 81 mg by mouth daily. Swallow whole.   bisacodyl (DULCOLAX) 10 MG suppository Place 1 suppository (10 mg total) rectally every 12 (twelve) hours as needed for moderate constipation or severe constipation.   budesonide (PULMICORT) 0.5 MG/2ML nebulizer solution TAKE 2 MLS (0.5 MG TOTAL) BY NEBULIZATION 2 (TWO) TIMES DAILY.   docusate sodium (COLACE) 100 MG capsule Take 1 capsule (100 mg total) by mouth 2 (two) times daily.   donepezil (ARICEPT) 5 MG tablet Take 1 tablet (5 mg total) by mouth at bedtime.   escitalopram (LEXAPRO) 5 MG tablet Take 20 mg by mouth daily.   feeding supplement (BOOST HIGH PROTEIN) LIQD Take 237 mLs by mouth 2 (two) times daily.   ferrous sulfate 325 (65 FE) MG EC tablet Take 325 mg by mouth 3 (three) times daily with meals.   fluticasone (FLONASE) 50 MCG/ACT nasal spray Place 1 spray into both nostrils daily.   ipratropium (ATROVENT) 0.02 % nebulizer solution TAKE 2.5 MLS (0.5 MG TOTAL) BY NEBULIZATION 4 (FOUR) TIMES DAILY.   Lidocaine (SALONPAS PAIN RELIEVING EX) Apply 1 Application topically 2 (two) times daily as needed (pain).   Menthol, Topical Analgesic, (BIOFREEZE) 4 % GEL Apply topically. Apply to affected areas topically as needed for Pain Bio-freeze gel 4% 2 grams topical BID PRN   Multiple Vitamins-Minerals (MULTIVITAMIN WITH MINERALS) tablet Take 1 tablet by mouth daily.    pantoprazole (PROTONIX) 40 MG tablet TAKE 1 TABLET BY MOUTH EVERY DAY   polyethylene glycol (MIRALAX / GLYCOLAX) 17 g packet Take 17 g by mouth daily.   promethazine (PHENERGAN) 25 MG tablet Take 25 mg by mouth every 6 (six) hours as needed for nausea or vomiting.   traMADol-acetaminophen (ULTRACET) 37.5-325 MG tablet Take 1 tablet by mouth 3 (three) times daily.   traZODone (DESYREL) 150 MG tablet Take 150 mg by mouth at bedtime. Give 0.5 tablet   No facility-administered encounter medications on file as of 07/01/2022.    Review of Systems  Constitutional:  Positive for appetite change and unexpected weight change.  Respiratory:  Positive for shortness of breath.   Musculoskeletal:  Positive for gait problem.  Psychiatric/Behavioral:  Positive for confusion.   All other systems reviewed and are negative.   There were no vitals filed for this visit. There is no height or weight on file to calculate BMI. Physical Exam Vitals and nursing note reviewed.  Constitutional:      Appearance: Normal appearance. She is ill-appearing.  Pulmonary:     Effort: Pulmonary effort is normal.     Comments: Wearing oxygen Skin:    General: Skin is warm and dry.  Neurological:     General: No focal deficit present.     Mental Status: She is alert.     Comments: Oriented to self  Psychiatric:        Mood and Affect: Mood normal.     Comments: Behaviors are as noted above in HPI.  Further history provided by husband     Labs reviewed: Basic Metabolic Panel: Recent Labs    10/12/21 0318 10/24/21 0023 01/29/22 2306 01/30/22 1610 01/31/22 2244 02/01/22 0237 02/02/22 0801 04/10/22 1410  NA 142   < > 140   < > 137  --  136 142  K 3.6   < > 2.9*   < > 4.1  --  3.9 4.1  CL 109   < > 100   < >  102  --  105 103  CO2 28   < > 31   < > 28  --  22 26  GLUCOSE 87   < > 128*   < > 80  --  80 95  BUN 14   < > 17   < > 19  --  19 18  CREATININE 0.95   < > 1.11*   < > 1.30*  --  1.10* 0.92  CALCIUM  8.8*   < > 9.1   < > 8.9  --  8.6* 9.6  MG 2.3  --  1.7  --   --  2.1  --   --   PHOS  --   --  2.4*  --   --  4.1  --   --    < > = values in this interval not displayed.   Liver Function Tests: Recent Labs    10/12/21 0318 11/28/21 1106 01/08/22 1521 04/10/22 1410  AST '26 22 26 24  '$ ALT '15 10 14 13  '$ ALKPHOS 42  --  49 76  BILITOT 0.7 0.3 0.3 <0.2  PROT 5.5* 6.5 5.7* 6.1  ALBUMIN 3.0*  --  3.0* 3.8   No results for input(s): "LIPASE", "AMYLASE" in the last 8760 hours. No results for input(s): "AMMONIA" in the last 8760 hours. CBC: Recent Labs    01/08/22 1521 01/29/22 2306 01/30/22 1610 02/01/22 0237 02/02/22 0801 04/10/22 1410  WBC 7.3 10.3   < > 7.0 6.9 6.3  NEUTROABS 5.1 8.6*  --  4.2  --   --   HGB 9.7* 10.9*   < > 10.3* 10.0* 10.2*  HCT 30.6* 34.4*   < > 32.1* 32.4* 31.4*  MCV 93.6 92.2   < > 92.8 94.2 91  PLT 171 169   < > 180 187 231   < > = values in this interval not displayed.   Cardiac Enzymes: No results for input(s): "CKTOTAL", "CKMB", "CKMBINDEX", "TROPONINI" in the last 8760 hours. BNP: Invalid input(s): "POCBNP" Lab Results  Component Value Date   HGBA1C 5.2 07/09/2017   Lab Results  Component Value Date   TSH 2.88 11/28/2021   Lab Results  Component Value Date   VITAMINB12 1,463 (H) 10/24/2021   Lab Results  Component Value Date   FOLATE >24.0 10/24/2021   Lab Results  Component Value Date   IRON 82 10/11/2021   TIBC 285 10/11/2021   FERRITIN 67 09/25/2021    Imaging and Procedures obtained prior to SNF admission: CT Head Wo Contrast  Result Date: 05/10/2022 CLINICAL DATA:  Multiple recent falls with frontal hematoma, initial encounter EXAM: CT HEAD WITHOUT CONTRAST TECHNIQUE: Contiguous axial images were obtained from the base of the skull through the vertex without intravenous contrast. RADIATION DOSE REDUCTION: This exam was performed according to the departmental dose-optimization program which includes automated exposure  control, adjustment of the mA and/or kV according to patient size and/or use of iterative reconstruction technique. COMPARISON:  02/12/2022 FINDINGS: Brain: No evidence of acute infarction, hemorrhage, hydrocephalus, extra-axial collection or mass lesion/mass effect. Chronic atrophic and ischemic changes are noted. Vascular: No hyperdense vessel or unexpected calcification. Skull: Normal. Negative for fracture or focal lesion. Sinuses/Orbits: No acute finding. Other: Left frontal hematoma is noted consistent with the recent injury. IMPRESSION: Left frontal scalp hematoma. Chronic atrophic and ischemic changes without acute abnormality. Electronically igned   By: Inez Catalina M.D.   On: 05/10/2022 02:37    Assessment/Plan 1. Gait  abnormality Patient needs walker, not wheelchair  2. Mild vascular dementia without behavioral disturbance, psychotic disturbance, mood disturbance, or anxiety (HCC) Will discontinue Aricept and begin Namenda tapering the dose upward.  Will also add alprazolam scheduled twice a day while increasing Namenda.  If behaviors are not improved consider addition of Depakote    Family/ staff Communication:   Labs/tests ordered:  Lillette Boxer. Sabra Heck, [place] [address] [place], [place] Office [phone]

## 2022-07-23 ENCOUNTER — Ambulatory Visit: Payer: Medicare PPO | Admitting: Cardiovascular Disease

## 2022-07-23 ENCOUNTER — Other Ambulatory Visit (HOSPITAL_COMMUNITY): Payer: Medicare PPO

## 2022-07-25 DIAGNOSIS — S2221XD Fracture of manubrium, subsequent encounter for fracture with routine healing: Secondary | ICD-10-CM | POA: Diagnosis not present

## 2022-07-28 DIAGNOSIS — S2221XD Fracture of manubrium, subsequent encounter for fracture with routine healing: Secondary | ICD-10-CM | POA: Diagnosis not present

## 2022-07-29 ENCOUNTER — Non-Acute Institutional Stay (SKILLED_NURSING_FACILITY): Payer: Medicare PPO | Admitting: Family Medicine

## 2022-07-29 DIAGNOSIS — E441 Mild protein-calorie malnutrition: Secondary | ICD-10-CM | POA: Diagnosis not present

## 2022-07-29 DIAGNOSIS — F01A18 Vascular dementia, mild, with other behavioral disturbance: Secondary | ICD-10-CM | POA: Diagnosis not present

## 2022-07-29 DIAGNOSIS — S2221XD Fracture of manubrium, subsequent encounter for fracture with routine healing: Secondary | ICD-10-CM | POA: Diagnosis not present

## 2022-07-29 DIAGNOSIS — R269 Unspecified abnormalities of gait and mobility: Secondary | ICD-10-CM | POA: Diagnosis not present

## 2022-07-29 DIAGNOSIS — G8929 Other chronic pain: Secondary | ICD-10-CM

## 2022-07-29 DIAGNOSIS — M545 Low back pain, unspecified: Secondary | ICD-10-CM | POA: Diagnosis not present

## 2022-07-29 DIAGNOSIS — J449 Chronic obstructive pulmonary disease, unspecified: Secondary | ICD-10-CM

## 2022-07-29 NOTE — Progress Notes (Signed)
Provider:  Alain Honey, MD Location:      Place of Service:     PCP: Elby Showers, MD Patient Care Team: Elby Showers, MD as PCP - General (Internal Medicine) Burnell Blanks, MD as PCP - Cardiology (Cardiology) Collene Gobble, MD as Consulting Physician (Pulmonary Disease)  Extended Emergency Contact Information Primary Emergency Contact: Vanwyk,[name] Address: [address]          [place], [place] [address] Johnnette Litter of [place] Phone: [phone] Mobile Phone: [phone] Relation: Spouse Secondary Emergency Contact: Sunday Shams Mobile Phone: [phone] Relation: Granddaughter  Code Status:  Goals of Care: Advanced Directive information    05/23/2022    2:38 PM  Advanced Directives  Does Patient Have a Medical Advance Directive? Yes  Type of Advance Directive Out of facility DNR (pink MOST or yellow form)  Does patient want to make changes to medical advance directive? No - Patient declined      No chief complaint on file.   HPI: Patient is a 89 y.o. female seen today for medical management of chronic problems including COPD, dementia, chronic pain, usually in her back.  History of frequent falls.  She has been seen by neurologist in the past who recommended Aricept Increase Lexapro for her dementia with agitation.  I saw her at the request of her husband several weeks ago.  She had been throwing food at him and cursing him and my plan is to begin [place] with increasing dose to therapeutic level as well as schedule alprazolam.  Nursing reports some improvement in behaviors and that she only curses her husband not other staff now. She spends most of her time in bed.  Using wheelchair rather than walker.  Nursing also requests appetite stimulant  Past Medical History:  Diagnosis Date   Allergy    Anemia    Arthritis    Asthma    patient denies   B12 deficiency    COPD (chronic obstructive pulmonary disease) ([place])    Depression    not  recent   Diverticulosis    Esophageal stricture    Hepatic cyst 01/30/2010   Hiatal hernia 1985   patient unaware   Hx of adenomatous colonic polyps 2006   Hyperlipidemia    IBS (irritable bowel syndrome)    PONV (postoperative nausea and vomiting)    no nausea or vomitting after surgery 07/2014   Positive PPD    PUD (peptic ulcer disease)    S/P TAVR (transcatheter aortic valve replacement) 08/13/2021   s/p TAVR with a 38m Edwards S3UR via the right carotid approach by Dr. MAngelena Form& Dr. BCyndia Bent  Severe aortic stenosis    Past Surgical History:  Procedure Laterality Date   ABDOMINAL HYSTERECTOMY     abnormal bleeding   APPENDECTOMY     BARTHOLIN GLAND CYST EXCISION     x2   BREAST BIOPSY Bilateral    Milk glnd   CATARACT EXTRACTION W/ INTRAOCULAR LENS  IMPLANT, BILATERAL Bilateral    COLONOSCOPY     COLONOSCOPY W/ POLYPECTOMY     HARDWARE REMOVAL N/A 12/13/2015   Procedure: Removal of HARDWARE  Lumbar Five-Sacral One ;  Surgeon: GKary Kos MD;  Location: [facility];  Service: Neurosurgery;  Laterality: N/A;   IR KYPHO LUMBAR INC FX REDUCE BONE BX UNI/BIL CANNULATION INC/IMAGING  07/13/2019   PERICARDIAL WINDOW  08/13/2021   Procedure: PERICARDIAL WINDOW;  Surgeon: MBurnell Blanks MD;  [facility]  Service: Open Heart  Surgery;;   RIGHT/LEFT HEART CATH AND CORONARY ANGIOGRAPHY N/A 07/11/2021   Procedure: RIGHT/LEFT HEART CATH AND CORONARY ANGIOGRAPHY;  Surgeon: Burnell Blanks, MD;  Location: [facility];  Service: Cardiovascular;  Laterality: N/A;   TEE WITHOUT CARDIOVERSION N/A 08/13/2021   Procedure: TRANSESOPHAGEAL ECHOCARDIOGRAM (TEE);  Surgeon: Burnell Blanks, MD;  Location: [facility];  Service: Open Heart Surgery;  Laterality: N/A;   TRANSCATHETER AORTIC VALVE REPLACEMENT, CAROTID Right 08/13/2021   Procedure: Transcatheter Aortic Valve Replacement-Carotid;  Surgeon: Burnell Blanks, MD;  Location: [facility];  Service: Open Heart  Surgery;  Laterality: Right;   UPPER GI ENDOSCOPY     VAGOTOMY AND PYLOROPLASTY     in [place], [facility]    reports that she quit smoking about 12 years ago. Her smoking use included cigarettes. She has never used smokeless tobacco. She reports that she does not drink alcohol and does not use drugs. Social History   Socioeconomic History   Marital status: Married    Spouse name: Not on file   Number of children: 2   Years of education: Not on file   Highest education level: Not on file  Occupational History    Employer: RETIRED   Occupation: Retired-office work  Tobacco Use   Smoking status: Former    Years: 60.00    Types: Cigarettes    Quit date: 02/02/2010    Years since quitting: 12.4   Smokeless tobacco: Never  Vaping Use   Vaping Use: Never used  Substance and Sexual Activity   Alcohol use: No    Comment: quit in 1983   Drug use: No   Sexual activity: Not Currently  Other Topics Concern   Not on file  Social History Narrative   Daily caffeine    Social Determinants of Health   Financial Resource Strain: Not on file  Food Insecurity: Not on file  Transportation Needs: Not on file  Physical Activity: Not on file  Stress: Not on file  Social Connections: Not on file  Intimate Partner Violence: Not on file    Functional Status Survey:    Family History  Problem Relation Age of Onset   Ulcers Father    Stroke Father    Irritable bowel syndrome Father    Ovarian cancer Sister    Colon cancer Neg Hx     Health Maintenance  Topic Date Due   Zoster Vaccines- Shingrix (2 of 2) 04/19/2019   COVID-19 Vaccine (6 - 2023-24 season) 06/12/2022   Medicare Annual Wellness (AWV)  12/04/2022   DTaP/Tdap/Td (3 - Td or Tdap) 02/13/2032   Pneumonia Vaccine 32+ Years old  Completed   INFLUENZA VACCINE  Completed   DEXA SCAN  Completed   HPV VACCINES  Aged Out    No Known Allergies  Outpatient Encounter Medications as of 07/29/2022  Medication Sig   acetaminophen  (TYLENOL) 500 MG tablet Take 500 mg by mouth 3 (three) times daily.   albuterol (VENTOLIN HFA) 108 (90 Base) MCG/ACT inhaler INHALE 2 PUFFS INTO THE LUNGS 4 (FOUR) TIMES DAILY AS NEEDED FOR WHEEZING OR SHORTNESS OF BREATH.   ALPRAZolam (XANAX) 0.25 MG tablet Take 1 tablet (0.25 mg total) by mouth at bedtime as needed for anxiety or sleep. (Patient taking differently: Take 0.25 mg by mouth at bedtime.)   aspirin EC 81 MG tablet Take 81 mg by mouth daily. Swallow whole.   bisacodyl (DULCOLAX) 10 MG suppository Place 1 suppository (10 mg total) rectally every 12 (twelve) hours  as needed for moderate constipation or severe constipation.   budesonide (PULMICORT) 0.5 MG/2ML nebulizer solution TAKE 2 MLS (0.5 MG TOTAL) BY NEBULIZATION 2 (TWO) TIMES DAILY.   docusate sodium (COLACE) 100 MG capsule Take 1 capsule (100 mg total) by mouth 2 (two) times daily.   donepezil (ARICEPT) 5 MG tablet Take 1 tablet (5 mg total) by mouth at bedtime.   escitalopram (LEXAPRO) 5 MG tablet Take 20 mg by mouth daily.   feeding supplement (BOOST HIGH PROTEIN) LIQD Take 237 mLs by mouth 2 (two) times daily.   ferrous sulfate 325 (65 FE) MG EC tablet Take 325 mg by mouth 3 (three) times daily with meals.   fluticasone (FLONASE) 50 MCG/ACT nasal spray Place 1 spray into both nostrils daily.   ipratropium (ATROVENT) 0.02 % nebulizer solution TAKE 2.5 MLS (0.5 MG TOTAL) BY NEBULIZATION 4 (FOUR) TIMES DAILY.   Lidocaine (SALONPAS PAIN RELIEVING EX) Apply 1 Application topically 2 (two) times daily as needed (pain).   Menthol, Topical Analgesic, (BIOFREEZE) 4 % GEL Apply topically. Apply to affected areas topically as needed for Pain Bio-freeze gel 4% 2 grams topical BID PRN   Multiple Vitamins-Minerals (MULTIVITAMIN WITH MINERALS) tablet Take 1 tablet by mouth daily.   pantoprazole (PROTONIX) 40 MG tablet TAKE 1 TABLET BY MOUTH EVERY DAY   polyethylene glycol (MIRALAX / GLYCOLAX) 17 g packet Take 17 g by mouth daily.    promethazine (PHENERGAN) 25 MG tablet Take 25 mg by mouth every 6 (six) hours as needed for nausea or vomiting.   traMADol-acetaminophen (ULTRACET) 37.5-325 MG tablet Take 1 tablet by mouth 3 (three) times daily.   traZODone (DESYREL) 150 MG tablet Take 150 mg by mouth at bedtime. Give 0.5 tablet   No facility-administered encounter medications on file as of 07/29/2022.    Review of Systems  Constitutional:  Positive for appetite change.  HENT: Negative.    Respiratory:  Positive for shortness of breath.   Cardiovascular: Negative.  Negative for leg swelling.  Gastrointestinal: Negative.   Musculoskeletal:  Positive for gait problem.  Psychiatric/Behavioral:  Positive for agitation.   All other systems reviewed and are negative.   There were no vitals filed for this visit. There is no height or weight on file to calculate BMI. Physical Exam Vitals and nursing note reviewed.  Constitutional:      Appearance: Normal appearance.  HENT:     Head: Normocephalic.  Cardiovascular:     Rate and Rhythm: Normal rate and regular rhythm.  Pulmonary:     Effort: Pulmonary effort is normal.     Breath sounds: Normal breath sounds.     Comments: Uses continuous O2 Abdominal:     General: Bowel sounds are normal.  Neurological:     General: No focal deficit present.     Mental Status: She is alert.     Comments: Oriented to person and place  Psychiatric:        Mood and Affect: Mood normal.        Behavior: Behavior normal.     Labs reviewed: Basic Metabolic Panel: Recent Labs    10/12/21 0318 10/24/21 0023 01/29/22 2306 01/30/22 1610 01/31/22 2244 02/01/22 0237 02/02/22 0801 04/10/22 1410  NA 142   < > 140   < > 137  --  136 142  K 3.6   < > 2.9*   < > 4.1  --  3.9 4.1  CL 109   < > 100   < > 102  --  105 103  CO2 28   < > 31   < > 28  --  22 26  GLUCOSE 87   < > 128*   < > 80  --  80 95  BUN 14   < > 17   < > 19  --  19 18  CREATININE 0.95   < > 1.11*   < > 1.30*  --   1.10* 0.92  CALCIUM 8.8*   < > 9.1   < > 8.9  --  8.6* 9.6  MG 2.3  --  1.7  --   --  2.1  --   --   PHOS  --   --  2.4*  --   --  4.1  --   --    < > = values in this interval not displayed.   Liver Function Tests: Recent Labs    10/12/21 0318 11/28/21 1106 01/08/22 1521 04/10/22 1410  AST 26 22 26 24  $ ALT 15 10 14 13  $ ALKPHOS 42  --  49 76  BILITOT 0.7 0.3 0.3 <0.2  PROT 5.5* 6.5 5.7* 6.1  ALBUMIN 3.0*  --  3.0* 3.8   No results for input(s): "LIPASE", "AMYLASE" in the last 8760 hours. No results for input(s): "AMMONIA" in the last 8760 hours. CBC: Recent Labs    01/08/22 1521 01/29/22 2306 01/30/22 1610 02/01/22 0237 02/02/22 0801 04/10/22 1410  WBC 7.3 10.3   < > 7.0 6.9 6.3  NEUTROABS 5.1 8.6*  --  4.2  --   --   HGB 9.7* 10.9*   < > 10.3* 10.0* 10.2*  HCT 30.6* 34.4*   < > 32.1* 32.4* 31.4*  MCV 93.6 92.2   < > 92.8 94.2 91  PLT 171 169   < > 180 187 231   < > = values in this interval not displayed.   Cardiac Enzymes: No results for input(s): "CKTOTAL", "CKMB", "CKMBINDEX", "TROPONINI" in the last 8760 hours. BNP: Invalid input(s): "POCBNP" Lab Results  Component Value Date   HGBA1C 5.2 07/09/2017   Lab Results  Component Value Date   TSH 2.88 11/28/2021   Lab Results  Component Value Date   VITAMINB12 1,463 (H) 10/24/2021   Lab Results  Component Value Date   FOLATE >24.0 10/24/2021   Lab Results  Component Value Date   IRON 82 10/11/2021   TIBC 285 10/11/2021   FERRITIN 67 09/25/2021    Imaging and Procedures obtained prior to SNF admission: CT Head Wo Contrast  Result Date: 05/10/2022 CLINICAL DATA:  Multiple recent falls with frontal hematoma, initial encounter EXAM: CT HEAD WITHOUT CONTRAST TECHNIQUE: Contiguous axial images were obtained from the base of the skull through the vertex without intravenous contrast. RADIATION DOSE REDUCTION: This exam was performed according to the departmental dose-optimization program which includes  automated exposure control, adjustment of the mA and/or kV according to patient size and/or use of iterative reconstruction technique. COMPARISON:  02/12/2022 FINDINGS: Brain: No evidence of acute infarction, hemorrhage, hydrocephalus, extra-axial collection or mass lesion/mass effect. Chronic atrophic and ischemic changes are noted. Vascular: No hyperdense vessel or unexpected calcification. Skull: Normal. Negative for fracture or focal lesion. Sinuses/Orbits: No acute finding. Other: Left frontal hematoma is noted consistent with the recent injury. IMPRESSION: Left frontal scalp hematoma. Chronic atrophic and ischemic changes without acute abnormality. Electronically Signed   By: Inez Catalina M.D.   On: 05/10/2022 02:37    Assessment/Plan 1. Chronic low back pain, unspecified  back pain laterality, unspecified whether sciatica present Chronic pain secondary to spinal stenosis osteoarthritis.  Managed with tramadol and Tylenol  2. COPD, severe ([place]) 2 dependent.  She gets neb treatments regularly denies any shortness of breath today  3. Gait abnormality Uses wheelchair for ambulation now (at least is supposed to). No  4. Malnutrition of mild degree ([facility]) Weight is down about 3 pounds over the last 4 months.  Will begin low-dose mirtazapine in hopes of stimulating appetite  5. 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  Early indications of this might be helping    Family/ staff Communication:   Labs/tests ordered:  .smmsig

## 2022-07-30 DIAGNOSIS — S2221XD Fracture of manubrium, subsequent encounter for fracture with routine healing: Secondary | ICD-10-CM | POA: Diagnosis not present

## 2022-07-31 DIAGNOSIS — S2221XD Fracture of manubrium, subsequent encounter for fracture with routine healing: Secondary | ICD-10-CM | POA: Diagnosis not present

## 2022-08-04 DIAGNOSIS — S2221XD Fracture of manubrium, subsequent encounter for fracture with routine healing: Secondary | ICD-10-CM | POA: Diagnosis not present

## 2022-08-05 DIAGNOSIS — S2221XD Fracture of manubrium, subsequent encounter for fracture with routine healing: Secondary | ICD-10-CM | POA: Diagnosis not present

## 2022-08-06 DIAGNOSIS — S2221XD Fracture of manubrium, subsequent encounter for fracture with routine healing: Secondary | ICD-10-CM | POA: Diagnosis not present

## 2022-08-06 DIAGNOSIS — S52501D Unspecified fracture of the lower end of right radius, subsequent encounter for closed fracture with routine healing: Secondary | ICD-10-CM | POA: Diagnosis not present

## 2022-08-07 DIAGNOSIS — S2221XD Fracture of manubrium, subsequent encounter for fracture with routine healing: Secondary | ICD-10-CM | POA: Diagnosis not present

## 2022-08-08 DIAGNOSIS — S2221XD Fracture of manubrium, subsequent encounter for fracture with routine healing: Secondary | ICD-10-CM | POA: Diagnosis not present

## 2022-08-11 DIAGNOSIS — S2221XD Fracture of manubrium, subsequent encounter for fracture with routine healing: Secondary | ICD-10-CM | POA: Diagnosis not present

## 2022-08-12 DIAGNOSIS — S2221XD Fracture of manubrium, subsequent encounter for fracture with routine healing: Secondary | ICD-10-CM | POA: Diagnosis not present

## 2022-08-13 DIAGNOSIS — S2221XD Fracture of manubrium, subsequent encounter for fracture with routine healing: Secondary | ICD-10-CM | POA: Diagnosis not present

## 2022-08-15 ENCOUNTER — Other Ambulatory Visit (HOSPITAL_COMMUNITY): Payer: Medicare PPO

## 2022-08-15 ENCOUNTER — Ambulatory Visit: Payer: Medicare PPO

## 2022-08-15 DIAGNOSIS — S2221XD Fracture of manubrium, subsequent encounter for fracture with routine healing: Secondary | ICD-10-CM | POA: Diagnosis not present

## 2022-08-18 DIAGNOSIS — S2221XD Fracture of manubrium, subsequent encounter for fracture with routine healing: Secondary | ICD-10-CM | POA: Diagnosis not present

## 2022-08-20 DIAGNOSIS — S2221XD Fracture of manubrium, subsequent encounter for fracture with routine healing: Secondary | ICD-10-CM | POA: Diagnosis not present

## 2022-08-22 ENCOUNTER — Non-Acute Institutional Stay (SKILLED_NURSING_FACILITY): Payer: Medicare PPO | Admitting: Nurse Practitioner

## 2022-08-22 ENCOUNTER — Encounter: Payer: Self-pay | Admitting: Nurse Practitioner

## 2022-08-22 DIAGNOSIS — S2221XD Fracture of manubrium, subsequent encounter for fracture with routine healing: Secondary | ICD-10-CM | POA: Diagnosis not present

## 2022-08-22 DIAGNOSIS — F419 Anxiety disorder, unspecified: Secondary | ICD-10-CM

## 2022-08-22 DIAGNOSIS — K5901 Slow transit constipation: Secondary | ICD-10-CM | POA: Diagnosis not present

## 2022-08-22 DIAGNOSIS — J449 Chronic obstructive pulmonary disease, unspecified: Secondary | ICD-10-CM

## 2022-08-22 DIAGNOSIS — K219 Gastro-esophageal reflux disease without esophagitis: Secondary | ICD-10-CM

## 2022-08-22 DIAGNOSIS — M159 Polyosteoarthritis, unspecified: Secondary | ICD-10-CM

## 2022-08-22 DIAGNOSIS — D508 Other iron deficiency anemias: Secondary | ICD-10-CM | POA: Diagnosis not present

## 2022-08-22 DIAGNOSIS — F0390 Unspecified dementia without behavioral disturbance: Secondary | ICD-10-CM

## 2022-08-22 NOTE — Assessment & Plan Note (Signed)
hypoxia, chronic respiratory failure, followed by Pulmonology, takes Atrovent Neb, Pulmicort, prn Albuterol. O2, Flonase

## 2022-08-22 NOTE — Assessment & Plan Note (Signed)
Stable, takes Pantoprazole, prn Phenergan, Hgb 10.2 04/10/22

## 2022-08-22 NOTE — Assessment & Plan Note (Signed)
takes Lexapro, Trazodone, Mirtazapine, prn Alprazolam.

## 2022-08-22 NOTE — Assessment & Plan Note (Signed)
Chronic back pain, Hx of compression fx, managed with Tramadol, Tylenol, Lidocaine patch,  off Gabapentin

## 2022-08-22 NOTE — Progress Notes (Signed)
Location:  [facility] Room Number: [place] of Service:  SNF (31) Provider:  Kaydenn Mclear X, NP    Patient Care Team: Burnell Blanks, MD as PCP - Cardiology (Cardiology) Collene Gobble, MD as Consulting Physician (Pulmonary Disease)  Extended Emergency Contact Information Primary Emergency Contact: Zirbel,[name] Address: [address]          [place], [place] [address] Johnnette Litter of [place] Phone: [phone] Mobile Phone: [phone] Relation: Spouse Secondary Emergency Contact: Sunday Shams Mobile Phone: [phone] Relation: Granddaughter  Code Status:  DNR  Goals of care: Advanced Directive information    08/22/2022   10:11 AM  Advanced Directives  Does Patient Have a Medical Advance Directive? Yes  Type of Advance Directive Out of facility DNR (pink MOST or yellow form);Living will;Healthcare Power of Attorney  Does patient want to make changes to medical advance directive? No - Patient declined  Copy of [place] in Chart? Yes - validated most recent copy scanned in chart (See row information)  Pre-existing out of facility DNR order (yellow form or pink MOST form) Pink MOST/Yellow Form most recent copy in chart - Physician notified to receive inpatient order     Chief Complaint  Patient presents with   Medical Management of Chronic Issues    Routine Visit   Quality Metric Gaps    Needs to discuss vaccine Shingrix and Covid     HPI:  Pt is a 89 y.o. female seen today for medical management of chronic diseases.    GERD, takes Pantoprazole, prn Phenergan, Hgb 10.2 04/10/22             Constipation, takes MiraLax, Colace, Prn Dulcolax suppository,              Cognitive impairment, takes ASA, off Donepezil, on Namenda, CT head 05/10/22 Left frontal scalp hematoma. Chronic atrophic and ischemic changes without acute abnormality.              Chronic back pain, Hx of compression fx, managed with Tramadol,  Tylenol, Lidocaine patch,  off Gabapentin             COPD, hypoxia, chronic respiratory failure, followed by Pulmonology, takes Atrovent Neb, Pulmicort, prn Albuterol. O2, Flonase             Depression, takes Lexapro, Trazodone, Mirtazapine, prn Alprazolam.              Gait abnormality, needs assistance with transfer, ambulates with walker with SBA.   Anemia, takes Fe, Hgb 10.2 04/10/22   Past Medical History:  Diagnosis Date   Allergy    Anemia    Arthritis    Asthma    patient denies   B12 deficiency    COPD (chronic obstructive pulmonary disease) ([place])    Depression    not recent   Diverticulosis    Esophageal stricture    Hepatic cyst 01/30/2010   Hiatal hernia 1985   patient unaware   Hx of adenomatous colonic polyps 2006   Hyperlipidemia    IBS (irritable bowel syndrome)    PONV (postoperative nausea and vomiting)    no nausea or vomitting after surgery 07/2014   Positive PPD    PUD (peptic ulcer disease)    S/P TAVR (transcatheter aortic valve replacement) 08/13/2021   s/p TAVR with a 33m Edwards S3UR via the right carotid approach by Dr. MAngelena Form& Dr. BCyndia Bent  Severe aortic stenosis    Past Surgical History:  Procedure Laterality Date   ABDOMINAL HYSTERECTOMY     abnormal bleeding   APPENDECTOMY     BARTHOLIN GLAND CYST EXCISION     x2   BREAST BIOPSY Bilateral    Milk glnd   CATARACT EXTRACTION W/ INTRAOCULAR LENS  IMPLANT, BILATERAL Bilateral    COLONOSCOPY     COLONOSCOPY W/ POLYPECTOMY     HARDWARE REMOVAL N/A 12/13/2015   Procedure: Removal of HARDWARE  Lumbar Five-Sacral One ;  Surgeon: Kary Kos, MD;  Location: [facility];  Service: Neurosurgery;  Laterality: N/A;   IR KYPHO LUMBAR INC FX REDUCE BONE BX UNI/BIL CANNULATION INC/IMAGING  07/13/2019   PERICARDIAL WINDOW  08/13/2021   Procedure: PERICARDIAL WINDOW;  Surgeon: Burnell Blanks, MD;  Location: [facility];  Service: Open Heart Surgery;;   RIGHT/LEFT HEART CATH AND CORONARY ANGIOGRAPHY  N/A 07/11/2021   Procedure: RIGHT/LEFT HEART CATH AND CORONARY ANGIOGRAPHY;  Surgeon: Burnell Blanks, MD;  Location: [facility];  Service: Cardiovascular;  Laterality: N/A;   TEE WITHOUT CARDIOVERSION N/A 08/13/2021   Procedure: TRANSESOPHAGEAL ECHOCARDIOGRAM (TEE);  Surgeon: Burnell Blanks, MD;  Location: [facility];  Service: Open Heart Surgery;  Laterality: N/A;   TRANSCATHETER AORTIC VALVE REPLACEMENT, CAROTID Right 08/13/2021   Procedure: Transcatheter Aortic Valve Replacement-Carotid;  Surgeon: Burnell Blanks, MD;  Location: [facility];  Service: Open Heart Surgery;  Laterality: Right;   UPPER GI ENDOSCOPY     VAGOTOMY AND PYLOROPLASTY     in [place], [place]    No Known Allergies  Outpatient Encounter Medications as of 08/22/2022  Medication Sig   acetaminophen (TYLENOL) 500 MG tablet Take 500 mg by mouth 3 (three) times daily.   albuterol (VENTOLIN HFA) 108 (90 Base) MCG/ACT inhaler INHALE 2 PUFFS INTO THE LUNGS 4 (FOUR) TIMES DAILY AS NEEDED FOR WHEEZING OR SHORTNESS OF BREATH.   ALPRAZolam (XANAX) 0.25 MG tablet Take 1 tablet (0.25 mg total) by mouth at bedtime as needed for anxiety or sleep.   aspirin EC 81 MG tablet Take 81 mg by mouth daily. Swallow whole.   bisacodyl (DULCOLAX) 10 MG suppository Place 1 suppository (10 mg total) rectally every 12 (twelve) hours as needed for moderate constipation or severe constipation.   budesonide (PULMICORT) 0.5 MG/2ML nebulizer solution TAKE 2 MLS (0.5 MG TOTAL) BY NEBULIZATION 2 (TWO) TIMES DAILY.   docusate sodium (COLACE) 100 MG capsule Take 1 capsule (100 mg total) by mouth 2 (two) times daily.   escitalopram (LEXAPRO) 5 MG tablet Take 20 mg by mouth daily.   feeding supplement (BOOST HIGH PROTEIN) LIQD Take 237 mLs by mouth 2 (two) times daily.   ferrous sulfate 325 (65 FE) MG EC tablet Take 325 mg by mouth 3 (three) times daily with meals.   fluticasone (FLONASE) 50 MCG/ACT nasal spray Place 1 spray into both  nostrils daily.   ipratropium (ATROVENT) 0.02 % nebulizer solution TAKE 2.5 MLS (0.5 MG TOTAL) BY NEBULIZATION 4 (FOUR) TIMES DAILY.   Lidocaine (SALONPAS PAIN RELIEVING EX) Apply 1 Application topically 2 (two) times daily as needed (pain).   Menthol, Topical Analgesic, (BIOFREEZE) 4 % GEL Apply topically. Apply to affected areas topically as needed for Pain Bio-freeze gel 4% 2 grams topical BID PRN   Multiple Vitamins-Minerals (MULTIVITAMIN WITH MINERALS) tablet Take 1 tablet by mouth daily.   pantoprazole (PROTONIX) 40 MG tablet TAKE 1 TABLET BY MOUTH EVERY DAY   polyethylene glycol (MIRALAX / GLYCOLAX) 17 g packet Take 17 g by mouth daily.  promethazine (PHENERGAN) 25 MG tablet Take 25 mg by mouth every 6 (six) hours as needed for nausea or vomiting.   traMADol-acetaminophen (ULTRACET) 37.5-325 MG tablet Take 1 tablet by mouth 3 (three) times daily.   traZODone (DESYREL) 150 MG tablet Take 150 mg by mouth at bedtime. Give 0.5 tablet   [DISCONTINUED] donepezil (ARICEPT) 5 MG tablet Take 1 tablet (5 mg total) by mouth at bedtime.   No facility-administered encounter medications on file as of 08/22/2022.    Review of Systems  Constitutional:  Positive for fatigue. Negative for appetite change and fever.  HENT:  Positive for hearing loss. Negative for congestion and trouble swallowing.   Eyes:  Negative for visual disturbance.  Respiratory:  Positive for shortness of breath. Negative for cough and wheezing.   Cardiovascular:  Negative for leg swelling.  Gastrointestinal:  Negative for abdominal pain, constipation, nausea and vomiting.  Genitourinary:  Negative for dysuria and urgency.  Musculoskeletal:  Positive for arthralgias, back pain and gait problem.  Skin:  Negative for color change.  Neurological:  Negative for dizziness, speech difficulty, weakness and headaches.  Psychiatric/Behavioral:  Positive for confusion. Negative for behavioral problems and sleep disturbance. The patient is  not nervous/anxious.     Immunization History  Administered Date(s) Administered   Fluad Quad(high Dose 65+) 03/18/2020, 05/15/2022   Influenza Split 02/20/2018   Influenza Whole 01/29/2010   Influenza, High Dose Seasonal PF 04/13/2014, 02/12/2016, 03/10/2017, 03/19/2018, 04/11/2019, 05/15/2022   Influenza-Unspecified 03/16/2014, 03/05/2021   Moderna Sars-Covid-2 Vaccination 04/17/2022   PFIZER(Purple Top)SARS-COV-2 Vaccination 08/08/2019, 08/29/2019, 03/18/2020, 02/26/2021   Pneumococcal Conjugate-13 05/12/2016   Pneumococcal Polysaccharide-23 08/08/1996   Tdap 04/10/2003, 02/12/2022   Zoster Recombinat (Shingrix) 02/22/2019   Pertinent  Health Maintenance Due  Topic Date Due   INFLUENZA VACCINE  Completed   DEXA SCAN  Completed      02/12/2022    3:03 AM 05/10/2022   12:52 AM 05/12/2022    3:50 PM 05/23/2022    2:38 PM 08/22/2022   10:08 AM  Fall Risk  Falls in the past year?   1 1 0  Was there an injury with Fall?   1 1 0  Fall Risk Category Calculator   3 3 0  Fall Risk Category (Retired)   [employer]   (RETIRED) Patient Fall Risk Level High fall risk High fall risk High fall risk High fall risk   Patient at Risk for Falls Due to   History of fall(s);Impaired balance/gait Impaired balance/gait;History of fall(s) History of fall(s);Impaired balance/gait;Impaired mobility  Fall risk Follow up   Falls evaluation completed Falls evaluation completed Falls evaluation completed   Functional Status Survey:    Vitals:   08/22/22 0950  BP: 118/62  Pulse: 66  Resp: 18  Temp: 97.9 F (36.6 C)  SpO2: 99%  Weight: 85 lb (38.6 kg)  Height: '4\' 10"'$  (1.473 m)   Body mass index is 17.77 kg/m. Physical Exam Vitals and nursing note reviewed.  Constitutional:      Comments: frail  HENT:     Head: Normocephalic and atraumatic.     Nose: Nose normal.     Mouth/Throat:     Mouth: Mucous membranes are moist.  Eyes:     Extraocular Movements: Extraocular movements intact.      Conjunctiva/sclera: Conjunctivae normal.     Pupils: Pupils are equal, round, and reactive to light.  Cardiovascular:     Rate and Rhythm: Normal rate and regular rhythm.     Heart  sounds: No murmur heard. Pulmonary:     Breath sounds: No wheezing, rhonchi or rales.     Comments: O2 via [place] Abdominal:     Palpations: Abdomen is soft.     Tenderness: There is no abdominal tenderness.  Musculoskeletal:     Cervical back: Normal range of motion and neck supple.     Right lower leg: No edema.     Left lower leg: No edema.     Comments: Left wrist splint.   Skin:    General: Skin is warm and dry.  Neurological:     General: No focal deficit present.     Mental Status: She is alert.     Motor: No weakness.     Coordination: Coordination normal.     Gait: Gait abnormal.     Comments: Oriented to person, her room on unit.   Psychiatric:        Mood and Affect: Mood normal.     Labs reviewed: Recent Labs    10/12/21 0318 10/24/21 0023 01/29/22 2306 01/30/22 1610 01/31/22 2244 02/01/22 0237 02/02/22 0801 04/10/22 1410  NA 142   < > 140   < > 137  --  136 142  K 3.6   < > 2.9*   < > 4.1  --  3.9 4.1  CL 109   < > 100   < > 102  --  105 103  CO2 28   < > 31   < > 28  --  22 26  GLUCOSE 87   < > 128*   < > 80  --  80 95  BUN 14   < > 17   < > 19  --  19 18  CREATININE 0.95   < > 1.11*   < > 1.30*  --  1.10* 0.92  CALCIUM 8.8*   < > 9.1   < > 8.9  --  8.6* 9.6  MG 2.3  --  1.7  --   --  2.1  --   --   PHOS  --   --  2.4*  --   --  4.1  --   --    < > = values in this interval not displayed.   Recent Labs    10/12/21 0318 11/28/21 1106 01/08/22 1521 04/10/22 1410  AST '26 22 26 24  '$ ALT '15 10 14 13  '$ ALKPHOS 42  --  49 76  BILITOT 0.7 0.3 0.3 <0.2  PROT 5.5* 6.5 5.7* 6.1  ALBUMIN 3.0*  --  3.0* 3.8   Recent Labs    01/08/22 1521 01/29/22 2306 01/30/22 1610 02/01/22 0237 02/02/22 0801 04/10/22 1410  WBC 7.3 10.3   < > 7.0 6.9 6.3  NEUTROABS 5.1 8.6*  --  4.2  --    --   HGB 9.7* 10.9*   < > 10.3* 10.0* 10.2*  HCT 30.6* 34.4*   < > 32.1* 32.4* 31.4*  MCV 93.6 92.2   < > 92.8 94.2 91  PLT 171 169   < > 180 187 231   < > = values in this interval not displayed.   Lab Results  Component Value Date   TSH 2.88 11/28/2021   Lab Results  Component Value Date   HGBA1C 5.2 07/09/2017   Lab Results  Component Value Date   CHOL 206 (H) 11/28/2021   HDL 68 11/28/2021   LDLCALC 115 (H) 11/28/2021   TRIG 124 11/28/2021  CHOLHDL 3.0 11/28/2021    Significant Diagnostic Results in last 30 days:  No results found.  Assessment/Plan COPD, severe (HCC)  hypoxia, chronic respiratory failure, followed by Pulmonology, takes Atrovent Neb, Pulmicort, prn Albuterol. O2, Flonase  Anxiety takes Lexapro, Trazodone, Mirtazapine, prn Alprazolam.   Iron deficiency anemia  takes Fe, Hgb 10.2 04/10/22  Osteoarthritis, multiple sites Chronic back pain, Hx of compression fx, managed with Tramadol, Tylenol, Lidocaine patch,  off Gabapentin  Senile dementia (HCC)  Cognitive impairment, takes ASA, off Donepezil, on Namenda, CT head 05/10/22 Left frontal scalp hematoma. Chronic atrophic and ischemic changes without acute abnormality.   Slow transit constipation Stable,  takes MiraLax, Colace, Prn Dulcolax suppository,   GE reflux Stable, takes Pantoprazole, prn Phenergan, Hgb 10.2 04/10/22     Family/ staff Communication: plan of care reviewed with the patient and charge nurse.   Labs/tests ordered:  none  Time spend 35 minutes.

## 2022-08-22 NOTE — Assessment & Plan Note (Signed)
Cognitive impairment, takes ASA, off Donepezil, on Namenda, CT head 05/10/22 Left frontal scalp hematoma. Chronic atrophic and ischemic changes without acute abnormality.

## 2022-08-22 NOTE — Assessment & Plan Note (Signed)
Stable,  takes MiraLax, Colace, Prn Dulcolax suppository,

## 2022-08-22 NOTE — Assessment & Plan Note (Signed)
takes Fe, Hgb 10.2 04/10/22

## 2022-08-25 DIAGNOSIS — S2221XD Fracture of manubrium, subsequent encounter for fracture with routine healing: Secondary | ICD-10-CM | POA: Diagnosis not present

## 2022-08-27 DIAGNOSIS — S2221XD Fracture of manubrium, subsequent encounter for fracture with routine healing: Secondary | ICD-10-CM | POA: Diagnosis not present

## 2022-08-28 ENCOUNTER — Other Ambulatory Visit: Payer: Self-pay | Admitting: Nurse Practitioner

## 2022-08-29 ENCOUNTER — Encounter: Payer: Self-pay | Admitting: Nurse Practitioner

## 2022-08-29 ENCOUNTER — Non-Acute Institutional Stay (SKILLED_NURSING_FACILITY): Payer: Medicare PPO | Admitting: Nurse Practitioner

## 2022-08-29 DIAGNOSIS — J449 Chronic obstructive pulmonary disease, unspecified: Secondary | ICD-10-CM | POA: Diagnosis not present

## 2022-08-29 DIAGNOSIS — D508 Other iron deficiency anemias: Secondary | ICD-10-CM

## 2022-08-29 DIAGNOSIS — M545 Low back pain, unspecified: Secondary | ICD-10-CM | POA: Diagnosis not present

## 2022-08-29 DIAGNOSIS — K219 Gastro-esophageal reflux disease without esophagitis: Secondary | ICD-10-CM | POA: Diagnosis not present

## 2022-08-29 DIAGNOSIS — R918 Other nonspecific abnormal finding of lung field: Secondary | ICD-10-CM | POA: Diagnosis not present

## 2022-08-29 DIAGNOSIS — F0390 Unspecified dementia without behavioral disturbance: Secondary | ICD-10-CM | POA: Diagnosis not present

## 2022-08-29 DIAGNOSIS — J9811 Atelectasis: Secondary | ICD-10-CM | POA: Diagnosis not present

## 2022-08-29 DIAGNOSIS — F419 Anxiety disorder, unspecified: Secondary | ICD-10-CM

## 2022-08-29 DIAGNOSIS — G8929 Other chronic pain: Secondary | ICD-10-CM

## 2022-08-29 DIAGNOSIS — J9809 Other diseases of bronchus, not elsewhere classified: Secondary | ICD-10-CM | POA: Diagnosis not present

## 2022-08-29 DIAGNOSIS — R300 Dysuria: Secondary | ICD-10-CM | POA: Diagnosis not present

## 2022-08-29 NOTE — Progress Notes (Signed)
This encounter was created in error - please disregard.

## 2022-08-29 NOTE — Assessment & Plan Note (Signed)
takes Pantoprazole, prn Phenergan, Hgb 10.2 04/10/22

## 2022-08-29 NOTE — Assessment & Plan Note (Addendum)
08/29/22 cough, wheezing, SOB, central congestion/discomfort, CXR ap/lateral, CBC/diff, CMP/eGFR, dc Ipratropium inhalation, starts DuoNeb q8hr and q12hr prn, Prednisone 20mg  qd x 7days, then 10mg  qd x 7days, then dc.  08/29/22 CXR change of infrahilar atelectasis.   COPD, hypoxia, chronic respiratory failure, followed by Pulmonology, takes Atrovent Neb, Pulmicort,  O2, Flonase

## 2022-08-29 NOTE — Assessment & Plan Note (Signed)
takes Fe, Hgb 10.2 04/10/22 

## 2022-08-29 NOTE — Assessment & Plan Note (Signed)
takes ASA, off Donepezil, on Namenda, CT head 05/10/22 Left frontal scalp hematoma. Chronic atrophic and ischemic changes without acute abnormality.

## 2022-08-29 NOTE — Assessment & Plan Note (Signed)
takes Lexapro, Trazodone, Mirtazapine, prn Alprazolam.  

## 2022-08-29 NOTE — Progress Notes (Signed)
Location:   SNF [facility] Room Number: 49 Place of Service:  SNF (31) Provider: Marlana Latus NP  No primary care provider on file.  Patient Care Team: Burnell Blanks, MD as PCP - Cardiology (Cardiology) Collene Gobble, MD as Consulting Physician (Pulmonary Disease)  Extended Emergency Contact Information Primary Emergency Contact: Campanaro,[name] Address: [address]          [place], [place] [address] Johnnette Litter of [place] Phone: [phone] Mobile Phone: [phone] Relation: Spouse Secondary Emergency Contact: Sunday Shams Mobile Phone: [phone] Relation: Granddaughter  Code Status: DNR Goals of care: Advanced Directive information    08/29/2022    3:15 PM  Advanced Directives  Does Patient Have a Medical Advance Directive? Yes  Type of Advance Directive Out of facility DNR (pink MOST or yellow form);Living will;Healthcare Power of Attorney  Does patient want to make changes to medical advance directive? No - Patient declined  Copy of [place] in Chart? Yes - validated most recent copy scanned in chart (See row information)  Pre-existing out of facility DNR order (yellow form or pink MOST form) Pink MOST/Yellow Form most recent copy in chart - Physician notified to receive inpatient order     Chief Complaint  Patient presents with   Acute Visit    Wheezing cough    HPI:  Pt is a 89 y.o. female seen today for an acute visit for wheezing cough, SOB, afebrile.    GERD, takes Pantoprazole, prn Phenergan, Hgb 10.2 04/10/22             Constipation, takes MiraLax, Colace, Prn Dulcolax suppository,              Cognitive impairment, takes ASA, off Donepezil, on Namenda, CT head 05/10/22 Left frontal scalp hematoma. Chronic atrophic and ischemic changes without acute abnormality.              Chronic back pain, Hx of compression fx, managed with Tramadol, Tylenol, Lidocaine patch,  off Gabapentin             COPD, hypoxia,  chronic respiratory failure, followed by Pulmonology, takes Atrovent Neb, Pulmicort,  O2, Flonase             Depression, takes Lexapro, Trazodone, Mirtazapine, prn Alprazolam.              Gait abnormality, needs assistance with transfer, ambulates with walker with SBA.              Anemia, takes Fe, Hgb 10.2 04/10/22       Past Medical History:  Diagnosis Date   Allergy    Anemia    Arthritis    Asthma    patient denies   B12 deficiency    COPD (chronic obstructive pulmonary disease) ([place])    Depression    not recent   Diverticulosis    Esophageal stricture    Hepatic cyst 01/30/2010   Hiatal hernia 1985   patient unaware   Hx of adenomatous colonic polyps 2006   Hyperlipidemia    IBS (irritable bowel syndrome)    PONV (postoperative nausea and vomiting)    no nausea or vomitting after surgery 07/2014   Positive PPD    PUD (peptic ulcer disease)    S/P TAVR (transcatheter aortic valve replacement) 08/13/2021   s/p TAVR with a 93mm Edwards S3UR via the right carotid approach by Dr. Angelena Form & Dr. Cyndia Bent   Severe aortic stenosis    Past Surgical History:  Procedure Laterality Date   ABDOMINAL HYSTERECTOMY     abnormal bleeding   APPENDECTOMY     BARTHOLIN GLAND CYST EXCISION     x2   BREAST BIOPSY Bilateral    Milk glnd   CATARACT EXTRACTION W/ INTRAOCULAR LENS  IMPLANT, BILATERAL Bilateral    COLONOSCOPY     COLONOSCOPY W/ POLYPECTOMY     HARDWARE REMOVAL N/A 12/13/2015   Procedure: Removal of HARDWARE  Lumbar Five-Sacral One ;  Surgeon: Kary Kos, MD;  Location: [facility];  Service: Neurosurgery;  Laterality: N/A;   IR KYPHO LUMBAR INC FX REDUCE BONE BX UNI/BIL CANNULATION INC/IMAGING  07/13/2019   PERICARDIAL WINDOW  08/13/2021   Procedure: PERICARDIAL WINDOW;  Surgeon: Burnell Blanks, MD;  Location: [facility];  Service: Open Heart Surgery;;   RIGHT/LEFT HEART CATH AND CORONARY ANGIOGRAPHY N/A 07/11/2021   Procedure: RIGHT/LEFT HEART CATH AND CORONARY  ANGIOGRAPHY;  Surgeon: Burnell Blanks, MD;  Location: [facility];  Service: Cardiovascular;  Laterality: N/A;   TEE WITHOUT CARDIOVERSION N/A 08/13/2021   Procedure: TRANSESOPHAGEAL ECHOCARDIOGRAM (TEE);  Surgeon: Burnell Blanks, MD;  Location: [facility];  Service: Open Heart Surgery;  Laterality: N/A;   TRANSCATHETER AORTIC VALVE REPLACEMENT, CAROTID Right 08/13/2021   Procedure: Transcatheter Aortic Valve Replacement-Carotid;  Surgeon: Burnell Blanks, MD;  Location: [facility];  Service: Open Heart Surgery;  Laterality: Right;   UPPER GI ENDOSCOPY     VAGOTOMY AND PYLOROPLASTY     in [place], [place]    No Known Allergies  Allergies as of 08/29/2022   No Known Allergies      Medication List        Accurate as of August 29, 2022  3:47 PM. If you have any questions, ask your nurse or doctor.          acetaminophen 500 MG tablet Commonly known as: TYLENOL Take 500 mg by mouth 3 (three) times daily.   ALPRAZolam 0.25 MG tablet Commonly known as: XANAX Take 1 tablet (0.25 mg total) by mouth at bedtime as needed for anxiety or sleep.   aspirin EC 81 MG tablet Take 81 mg by mouth daily. Swallow whole.   bisacodyl 10 MG suppository Commonly known as: DULCOLAX Place 1 suppository (10 mg total) rectally every 12 (twelve) hours as needed for moderate constipation or severe constipation.   budesonide 0.5 MG/2ML nebulizer solution Commonly known as: PULMICORT TAKE 2 MLS (0.5 MG TOTAL) BY NEBULIZATION 2 (TWO) TIMES DAILY.   docusate sodium 100 MG capsule Commonly known as: COLACE Take 1 capsule (100 mg total) by mouth 2 (two) times daily.   escitalopram 5 MG tablet Commonly known as: LEXAPRO Take 20 mg by mouth daily.   feeding supplement Liqd Take 237 mLs by mouth 2 (two) times daily.   ferrous sulfate 325 (65 FE) MG EC tablet Take 325 mg by mouth 3 (three) times daily with meals.   fluticasone 50 MCG/ACT nasal spray Commonly known as:  FLONASE Place 1 spray into both nostrils daily.   ipratropium 0.02 % nebulizer solution Commonly known as: ATROVENT Take 0.5 mg by nebulization 4 (four) times daily. What changed: Another medication with the same name was removed. Continue taking this medication, and follow the directions you see here. Changed by: TRUE Garciamartinez X Angelin Cutrone, NP   memantine 10 MG tablet Commonly known as: NAMENDA Take 10 mg by mouth 2 (two) times daily.   mirtazapine 7.5 MG tablet Commonly known as: REMERON Take 7.5 mg by  mouth at bedtime.   multivitamin with minerals tablet Take 1 tablet by mouth daily.   pantoprazole 40 MG tablet Commonly known as: PROTONIX TAKE 1 TABLET BY MOUTH EVERY DAY   polyethylene glycol 17 g packet Commonly known as: MIRALAX / GLYCOLAX Take 17 g by mouth daily.   promethazine 25 MG tablet Commonly known as: PHENERGAN Take 25 mg by mouth every 6 (six) hours as needed for nausea or vomiting.   traMADol-acetaminophen 37.5-325 MG tablet Commonly known as: ULTRACET Take 1 tablet by mouth 3 (three) times daily.   traZODone 150 MG tablet Commonly known as: DESYREL Take 150 mg by mouth at bedtime. Give 0.5 tablet        Review of Systems  Constitutional:  Positive for fatigue. Negative for appetite change and fever.  HENT:  Positive for hearing loss. Negative for congestion and trouble swallowing.   Eyes:  Negative for visual disturbance.  Respiratory:  Positive for cough, shortness of breath and wheezing.   Cardiovascular:  Negative for leg swelling.  Gastrointestinal:  Negative for abdominal pain, constipation, nausea and vomiting.  Genitourinary:  Negative for dysuria and urgency.  Musculoskeletal:  Positive for arthralgias, back pain and gait problem.  Skin:  Negative for color change.  Neurological:  Negative for dizziness, speech difficulty, weakness and headaches.  Psychiatric/Behavioral:  Positive for confusion. Negative for behavioral problems and sleep disturbance.  The patient is not nervous/anxious.     Immunization History  Administered Date(s) Administered   Fluad Quad(high Dose 65+) 03/18/2020, 05/15/2022   Influenza Split 02/20/2018   Influenza Whole 01/29/2010   Influenza, High Dose Seasonal PF 04/13/2014, 02/12/2016, 03/10/2017, 03/19/2018, 04/11/2019, 05/15/2022   Influenza-Unspecified 03/16/2014, 03/05/2021   Moderna Sars-Covid-2 Vaccination 04/17/2022   PFIZER(Purple Top)SARS-COV-2 Vaccination 08/08/2019, 08/29/2019, 03/18/2020, 02/26/2021   Pneumococcal Conjugate-13 05/12/2016   Pneumococcal Polysaccharide-23 08/08/1996   Tdap 04/10/2003, 02/12/2022   Zoster Recombinat (Shingrix) 02/22/2019   Pertinent  Health Maintenance Due  Topic Date Due   INFLUENZA VACCINE  Completed   DEXA SCAN  Completed      02/12/2022    3:03 AM 05/10/2022   12:52 AM 05/12/2022    3:50 PM 05/23/2022    2:38 PM 08/22/2022   10:08 AM  Fall Risk  Falls in the past year?   1 1 0  Was there an injury with Fall?   1 1 0  Fall Risk Category Calculator   3 3 0  Fall Risk Category (Retired)   [employer]   (RETIRED) Patient Fall Risk Level High fall risk High fall risk High fall risk High fall risk   Patient at Risk for Falls Due to   History of fall(s);Impaired balance/gait Impaired balance/gait;History of fall(s) History of fall(s);Impaired balance/gait;Impaired mobility  Fall risk Follow up   Falls evaluation completed Falls evaluation completed Falls evaluation completed   Functional Status Survey:    Vitals:   08/29/22 1339  BP: (!) 104/57  Pulse: 78  Resp: 18  Temp: 97.9 F (36.6 C)  TempSrc: Temporal  SpO2: 98%  Weight: 85 lb (38.6 kg)  Height: 4\' 10"  (1.473 m)   Body mass index is 17.77 kg/m. Physical Exam Vitals and nursing note reviewed.  Constitutional:      Comments: frail  HENT:     Head: Normocephalic and atraumatic.     Nose: Nose normal.     Mouth/Throat:     Mouth: Mucous membranes are moist.  Eyes:     Extraocular  Movements: Extraocular movements intact.  Conjunctiva/sclera: Conjunctivae normal.     Pupils: Pupils are equal, round, and reactive to light.  Cardiovascular:     Rate and Rhythm: Normal rate and regular rhythm.     Heart sounds: No murmur heard. Pulmonary:     Breath sounds: Wheezing present. No rhonchi or rales.     Comments: O2 via [place], diffused expiratory wheezes R+L lungs.  Abdominal:     Palpations: Abdomen is soft.     Tenderness: There is no abdominal tenderness.  Musculoskeletal:     Cervical back: Normal range of motion and neck supple.     Right lower leg: No edema.     Left lower leg: No edema.     Comments: Left wrist splint.   Skin:    General: Skin is warm and dry.  Neurological:     General: No focal deficit present.     Mental Status: She is alert.     Motor: No weakness.     Coordination: Coordination normal.     Gait: Gait abnormal.     Comments: Oriented to person, her room on unit.   Psychiatric:        Mood and Affect: Mood normal.     Labs reviewed: Recent Labs    10/12/21 0318 10/24/21 0023 01/29/22 2306 01/30/22 1610 01/31/22 2244 02/01/22 0237 02/02/22 0801 04/10/22 1410  NA 142   < > 140   < > 137  --  136 142  K 3.6   < > 2.9*   < > 4.1  --  3.9 4.1  CL 109   < > 100   < > 102  --  105 103  CO2 28   < > 31   < > 28  --  22 26  GLUCOSE 87   < > 128*   < > 80  --  80 95  BUN 14   < > 17   < > 19  --  19 18  CREATININE 0.95   < > 1.11*   < > 1.30*  --  1.10* 0.92  CALCIUM 8.8*   < > 9.1   < > 8.9  --  8.6* 9.6  MG 2.3  --  1.7  --   --  2.1  --   --   PHOS  --   --  2.4*  --   --  4.1  --   --    < > = values in this interval not displayed.   Recent Labs    10/12/21 0318 11/28/21 1106 01/08/22 1521 04/10/22 1410  AST 26 22 26 24   ALT 15 10 14 13   ALKPHOS 42  --  49 76  BILITOT 0.7 0.3 0.3 <0.2  PROT 5.5* 6.5 5.7* 6.1  ALBUMIN 3.0*  --  3.0* 3.8   Recent Labs    01/08/22 1521 01/29/22 2306 01/30/22 1610 02/01/22 0237  02/02/22 0801 04/10/22 1410  WBC 7.3 10.3   < > 7.0 6.9 6.3  NEUTROABS 5.1 8.6*  --  4.2  --   --   HGB 9.7* 10.9*   < > 10.3* 10.0* 10.2*  HCT 30.6* 34.4*   < > 32.1* 32.4* 31.4*  MCV 93.6 92.2   < > 92.8 94.2 91  PLT 171 169   < > 180 187 231   < > = values in this interval not displayed.   Lab Results  Component Value Date   TSH 2.88 11/28/2021  Lab Results  Component Value Date   HGBA1C 5.2 07/09/2017   Lab Results  Component Value Date   CHOL 206 (H) 11/28/2021   HDL 68 11/28/2021   LDLCALC 115 (H) 11/28/2021   TRIG 124 11/28/2021   CHOLHDL 3.0 11/28/2021    Significant Diagnostic Results in last 30 days:  No results found.  Assessment/Plan: COPD, severe ([place]) 08/29/22 cough, wheezing, SOB, central congestion/discomfort, CXR ap/lateral, CBC/diff, CMP/eGFR, dc Ipratropium inhalation, starts DuoNeb q8hr and q12hr prn, Prednisone 20mg  qd x 7days, then 10mg  qd x 7days, then dc.  08/29/22 CXR change of infrahilar atelectasis.   COPD, hypoxia, chronic respiratory failure, followed by Pulmonology, takes Atrovent Neb, Pulmicort,  O2, Flonase  Anxiety  takes Lexapro, Trazodone, Mirtazapine, prn Alprazolam.   Iron deficiency anemia takes Fe, Hgb 10.2 04/10/22  Chronic back pain Hx of compression fx, managed with Tramadol, Tylenol, Lidocaine patch,  off Gabapentin  Senile dementia (HCC) takes ASA, off Donepezil, on Namenda, CT head 05/10/22 Left frontal scalp hematoma. Chronic atrophic and ischemic changes without acute abnormality.     Family/ staff Communication: plan of care reviewed with the patient and charge nurse.   Labs/tests ordered:  CBC/diff, CMP/eGFR, CXR ap/lateral  Time spend 35 minutes.

## 2022-08-29 NOTE — Assessment & Plan Note (Signed)
Hx of compression fx, managed with Tramadol, Tylenol, Lidocaine patch,  off Gabapentin

## 2022-09-01 ENCOUNTER — Other Ambulatory Visit: Payer: Self-pay

## 2022-09-01 DIAGNOSIS — S2221XD Fracture of manubrium, subsequent encounter for fracture with routine healing: Secondary | ICD-10-CM | POA: Diagnosis not present

## 2022-09-01 DIAGNOSIS — I350 Nonrheumatic aortic (valve) stenosis: Secondary | ICD-10-CM

## 2022-09-01 DIAGNOSIS — Z952 Presence of prosthetic heart valve: Secondary | ICD-10-CM

## 2022-09-03 DIAGNOSIS — S2221XD Fracture of manubrium, subsequent encounter for fracture with routine healing: Secondary | ICD-10-CM | POA: Diagnosis not present

## 2022-09-05 DIAGNOSIS — S2221XD Fracture of manubrium, subsequent encounter for fracture with routine healing: Secondary | ICD-10-CM | POA: Diagnosis not present

## 2022-09-08 DIAGNOSIS — S2221XD Fracture of manubrium, subsequent encounter for fracture with routine healing: Secondary | ICD-10-CM | POA: Diagnosis not present

## 2022-09-10 DIAGNOSIS — S2221XD Fracture of manubrium, subsequent encounter for fracture with routine healing: Secondary | ICD-10-CM | POA: Diagnosis not present

## 2022-09-12 DIAGNOSIS — S2221XD Fracture of manubrium, subsequent encounter for fracture with routine healing: Secondary | ICD-10-CM | POA: Diagnosis not present

## 2022-09-15 ENCOUNTER — Telehealth: Payer: Self-pay | Admitting: Nurse Practitioner

## 2022-09-15 ENCOUNTER — Telehealth: Payer: Self-pay | Admitting: Physician Assistant

## 2022-09-15 DIAGNOSIS — S2221XD Fracture of manubrium, subsequent encounter for fracture with routine healing: Secondary | ICD-10-CM | POA: Diagnosis not present

## 2022-09-15 NOTE — Telephone Encounter (Signed)
Yes this is OK. We can always work her in sooner if she has trouble.

## 2022-09-15 NOTE — Telephone Encounter (Signed)
[facility]. Per DPR left detailed vm letting him know that dr. Lamonte Sakai said it was fine to put pt's f/u in 6 months and that I would put the recall in for her f/u. Recall has been put in for pt.   Nothing further needed.

## 2022-09-15 NOTE — Telephone Encounter (Signed)
Pt husband wants to know the necessity of the appt Schedule.

## 2022-09-15 NOTE — Telephone Encounter (Signed)
Spoke with patients husband. He canceled upcoming apt with [facility]. He advises its hard to get patient transported since she's not mobile and now has Dementia.  He wonders if he can reshedule her f/u for 6 more months. He states she is doing okay. She is on 02 and receive neb treatments 3times daily. I went over last AVS with husband were Dr. Lamonte Sakai stated she could f/u in a year, but he still wanted me to ask.  Dr. Lamonte Sakai can you please advise? Is 6 month f/u okay? Or does she need to be seen sooner

## 2022-09-15 NOTE — Telephone Encounter (Signed)
Pt spouse called in stating pt is in assisted living now for Dementia. She has an echo and 1 year f/u scheduled. Pt spouse wants to know how imperative is it that pt keep these appts

## 2022-09-16 ENCOUNTER — Telehealth: Payer: Self-pay | Admitting: Cardiology

## 2022-09-16 NOTE — Telephone Encounter (Signed)
Patient contacted for follow up s/p TAVR. She is currently residing in an assisted living due to worsening dementia. I spoke to her husband at length. He reports that she is basically bed bound and is now oxygen dependent due to progressive dementia. [facility] added to chart. She has NYHA class III/IV symptoms. He was contacted earlier today and appointment with Dr. Angelena Form was rescheduled for 09/29/22 with echocardiogram. He would like to cancel this appointment as well due to her condition. He asked to reschedule in early May.   Kathyrn Drown NP-C Structural Heart Team  Pager: [phone] Phone: [phone]

## 2022-09-17 DIAGNOSIS — S2221XD Fracture of manubrium, subsequent encounter for fracture with routine healing: Secondary | ICD-10-CM | POA: Diagnosis not present

## 2022-09-18 DIAGNOSIS — S2221XD Fracture of manubrium, subsequent encounter for fracture with routine healing: Secondary | ICD-10-CM | POA: Diagnosis not present

## 2022-09-23 NOTE — Telephone Encounter (Signed)
I spoke with the pt's husband and cancelled 4/15 echocardiogram and office visit appointment with Dr Clifton James.  The pt is bed bound on oxygen, has dementia and currently resides at [facility]. The pt's husband will speak with the MD at facility and if it is felt that the patient needs follow up with cardiology then Katherine [name] will contact me directly to assist with getting appointments arranged.

## 2022-09-24 ENCOUNTER — Ambulatory Visit: Payer: Medicare PPO | Admitting: Nurse Practitioner

## 2022-09-26 ENCOUNTER — Non-Acute Institutional Stay (SKILLED_NURSING_FACILITY): Payer: Medicare PPO | Admitting: Nurse Practitioner

## 2022-09-26 ENCOUNTER — Encounter: Payer: Self-pay | Admitting: Nurse Practitioner

## 2022-09-26 DIAGNOSIS — F0390 Unspecified dementia without behavioral disturbance: Secondary | ICD-10-CM

## 2022-09-26 DIAGNOSIS — R627 Adult failure to thrive: Secondary | ICD-10-CM | POA: Diagnosis not present

## 2022-09-26 DIAGNOSIS — M48061 Spinal stenosis, lumbar region without neurogenic claudication: Secondary | ICD-10-CM

## 2022-09-26 DIAGNOSIS — J449 Chronic obstructive pulmonary disease, unspecified: Secondary | ICD-10-CM

## 2022-09-26 DIAGNOSIS — K5901 Slow transit constipation: Secondary | ICD-10-CM

## 2022-09-26 DIAGNOSIS — F419 Anxiety disorder, unspecified: Secondary | ICD-10-CM

## 2022-09-26 DIAGNOSIS — K219 Gastro-esophageal reflux disease without esophagitis: Secondary | ICD-10-CM

## 2022-09-26 NOTE — Assessment & Plan Note (Signed)
Chronic back pain, Hx of compression fx, managed with Tramadol, Tylenol, Lidocaine patch,  off Gabapentin 

## 2022-09-26 NOTE — Assessment & Plan Note (Signed)
hypoxia, chronic respiratory failure, followed by Pulmonology, takes Atrovent Neb, Budesonide inh, DuoNeb,  O2, Flonase

## 2022-09-26 NOTE — Progress Notes (Unsigned)
Location:  [facility] Room Number: 63-A Place of Service:  SNF [phone]) Provider:  Stevenson Windmiller Dicie Beam  No primary care provider on file.  Patient Care Team: [name], MD as PCP - Cardiology (Cardiology) [name], MD as Consulting Physician (Pulmonary Disease)  Extended Emergency Contact Information Primary Emergency Contact: Nanni,[name] Address: [address]          [place], [place] [address] [name] Home Phone: [phone] Mobile Phone: [phone] Relation: Spouse Secondary Emergency Contact: [name] Mobile Phone: [phone] Relation: Granddaughter  Code Status:  DNR Goals of care: Advanced Directive information    09/26/2022   10:46 AM  Advanced Directives  Does Patient Have a Medical Advance Directive? Yes  Type of Estate agent of [place];Living will;Out of facility DNR (pink MOST or yellow form)  Does patient want to make changes to medical advance directive? No - Patient declined  Copy of Healthcare Power of Attorney in Chart? Yes - validated most recent copy scanned in chart (See row information)  Pre-existing out of facility DNR order (yellow form or pink MOST form) Pink MOST/Yellow Form most recent copy in chart - Physician notified to receive inpatient order     Chief Complaint  Patient presents with  . Routine    HPI:  Pt is a 89 y.o. female seen today for medical management of chronic diseases.    Adult failure to thrive, under hospice care. Bun/creat 19/0.76 08/29/22  GERD, takes Pantoprazole, prn Phenergan, Hgb 10.2 04/10/22             Constipation, takes MiraLax, Colace, Prn Dulcolax suppository,              Cognitive impairment, takes ASA, off Donepezil, on Namenda, CT head 05/10/22 Left frontal scalp hematoma. Chronic atrophic and ischemic changes without acute abnormality.              Chronic back pain, Hx of compression fx, managed with Tramadol, Tylenol,  Lidocaine patch,  off Gabapentin             COPD, hypoxia, chronic respiratory failure, followed by Pulmonology, takes Atrovent Neb, Budesonide inh, DuoNeb,  O2, Flonase             Depression, takes Lexapro, Trazodone, Mirtazapine, prn Alprazolam.              Gait abnormality, needs assistance with transfer, ambulates with walker with SBA.              Anemia, takes Fe, Hgb 10.2 04/10/22                             Past Medical History:  Diagnosis Date  . Allergy   . Anemia   . Arthritis   . Asthma    patient denies  . B12 deficiency   . COPD (chronic obstructive pulmonary disease)   . Depression    not recent  . Diverticulosis   . Esophageal stricture   . Hepatic cyst 01/30/2010  . Hiatal hernia 1985   patient unaware  . Hx of adenomatous colonic polyps 2006  . Hyperlipidemia   . IBS (irritable bowel syndrome)   . PONV (postoperative nausea and vomiting)    no nausea or vomitting after surgery 07/2014  . Positive PPD   . PUD (peptic ulcer disease)   . S/P TAVR (transcatheter aortic valve replacement) 08/13/2021   s/p TAVR with  a 72mm Edwards S3UR via the right carotid approach by Dr. Clifton James & Dr. Laneta Simmers  . Severe aortic stenosis    Past Surgical History:  Procedure Laterality Date  . ABDOMINAL HYSTERECTOMY     abnormal bleeding  . APPENDECTOMY    . BARTHOLIN GLAND CYST EXCISION     x2  . BREAST BIOPSY Bilateral    Milk glnd  . CATARACT EXTRACTION W/ INTRAOCULAR LENS  IMPLANT, BILATERAL Bilateral   . COLONOSCOPY    . COLONOSCOPY W/ POLYPECTOMY    . HARDWARE REMOVAL N/A 12/13/2015   Procedure: Removal of HARDWARE  Lumbar Five-Sacral One ;  Surgeon: Donalee Citrin, MD;  Location: [facility] NEURO ORS;  Service: Neurosurgery;  Laterality: N/A;  . IR KYPHO LUMBAR INC FX REDUCE BONE BX UNI/BIL CANNULATION INC/IMAGING  07/13/2019  . PERICARDIAL WINDOW  08/13/2021   Procedure: PERICARDIAL WINDOW;  Surgeon: [name], MD;  Location: [facility] OR;  Service: Open Heart Surgery;;   . RIGHT/LEFT HEART CATH AND CORONARY ANGIOGRAPHY N/A 07/11/2021   Procedure: RIGHT/LEFT HEART CATH AND CORONARY ANGIOGRAPHY;  Surgeon: [name], MD;  Location: [facility] INVASIVE CV LAB;  Service: Cardiovascular;  Laterality: N/A;  . TEE WITHOUT CARDIOVERSION N/A 08/13/2021   Procedure: TRANSESOPHAGEAL ECHOCARDIOGRAM (TEE);  Surgeon: [name], MD;  Location: [facility] OR;  Service: Open Heart Surgery;  Laterality: N/A;  . TRANSCATHETER AORTIC VALVE REPLACEMENT, CAROTID Right 08/13/2021   Procedure: Transcatheter Aortic Valve Replacement-Carotid;  Surgeon: [name], MD;  Location: [facility] OR;  Service: Open Heart Surgery;  Laterality: Right;  . UPPER GI ENDOSCOPY    . VAGOTOMY AND PYLOROPLASTY     in [place], [place]    No Known Allergies  Outpatient Encounter Medications as of 09/26/2022  Medication Sig  . ALPRAZolam (XANAX) 0.25 MG tablet Take 1 tablet (0.25 mg total) by mouth at bedtime as needed for anxiety or sleep.  Marland Kitchen aspirin EC 81 MG tablet Take 81 mg by mouth daily. Swallow whole.  . bisacodyl (DULCOLAX) 10 MG suppository Place 1 suppository (10 mg total) rectally every 12 (twelve) hours as needed for moderate constipation or severe constipation.  . budesonide (PULMICORT) 0.5 MG/2ML nebulizer solution TAKE 2 MLS (0.5 MG TOTAL) BY NEBULIZATION 2 (TWO) TIMES DAILY.  Marland Kitchen docusate sodium (COLACE) 100 MG capsule Take 1 capsule (100 mg total) by mouth 2 (two) times daily.  Marland Kitchen escitalopram (LEXAPRO) 5 MG tablet Take 20 mg by mouth daily.  . ferrous sulfate 325 (65 FE) MG EC tablet Take 325 mg by mouth daily.  . fluticasone (FLONASE) 50 MCG/ACT nasal spray Place 1 spray into both nostrils daily.  Marland Kitchen ipratropium (ATROVENT) 0.02 % nebulizer solution Take 0.5 mg by nebulization 4 (four) times daily.  . memantine (NAMENDA) 10 MG tablet Take 10 mg by mouth 2 (two) times daily.  . mirtazapine (REMERON) 7.5 MG tablet Take 7.5 mg by mouth at bedtime.  . Multiple Vitamins-Minerals  (MULTIVITAMIN WITH MINERALS) tablet Take 1 tablet by mouth daily.  . pantoprazole (PROTONIX) 40 MG tablet TAKE 1 TABLET BY MOUTH EVERY DAY  . polyethylene glycol (MIRALAX / GLYCOLAX) 17 g packet Take 17 g by mouth daily.  . promethazine (PHENERGAN) 25 MG tablet Take 25 mg by mouth every 6 (six) hours as needed for nausea or vomiting.  . traMADol-acetaminophen (ULTRACET) 37.5-325 MG tablet Take 1 tablet by mouth 3 (three) times daily.  . traZODone (DESYREL) 150 MG tablet Take 150 mg by mouth at bedtime. Give 0.5 tablet  . [DISCONTINUED]  acetaminophen (TYLENOL) 500 MG tablet Take 500 mg by mouth 3 (three) times daily.  . [DISCONTINUED] feeding supplement (BOOST HIGH PROTEIN) LIQD Take 237 mLs by mouth 2 (two) times daily.   No facility-administered encounter medications on file as of 09/26/2022.    Review of Systems  Constitutional:  Positive for fatigue. Negative for appetite change and fever.  HENT:  Positive for hearing loss. Negative for congestion and trouble swallowing.   Eyes:  Negative for visual disturbance.  Respiratory:  Positive for cough, shortness of breath and wheezing.   Cardiovascular:  Negative for leg swelling.  Gastrointestinal:  Negative for abdominal pain, constipation, nausea and vomiting.  Genitourinary:  Negative for dysuria and urgency.  Musculoskeletal:  Positive for arthralgias, back pain and gait problem.  Skin:  Negative for color change.  Neurological:  Negative for dizziness, speech difficulty, weakness and headaches.  Psychiatric/Behavioral:  Positive for confusion. Negative for behavioral problems and sleep disturbance. The patient is not nervous/anxious.     Immunization History  Administered Date(s) Administered  . Fluad Quad(high Dose 65+) 03/18/2020, 05/15/2022  . Influenza Split 02/20/2018  . Influenza Whole 01/29/2010  . Influenza, High Dose Seasonal PF 04/13/2014, 02/12/2016, 03/10/2017, 03/19/2018, 04/11/2019, 05/15/2022  . Influenza-Unspecified  03/16/2014, 03/05/2021  . Moderna Sars-Covid-2 Vaccination 04/17/2022  . PFIZER(Purple Top)SARS-COV-2 Vaccination 08/08/2019, 08/29/2019, 03/18/2020, 02/26/2021  . Pneumococcal Conjugate-13 05/12/2016  . Pneumococcal Polysaccharide-23 08/08/1996  . Tdap 04/10/2003, 02/12/2022  . Zoster Recombinat (Shingrix) 02/22/2019   Pertinent  Health Maintenance Due  Topic Date Due  . INFLUENZA VACCINE  01/15/2023  . DEXA SCAN  Completed      02/12/2022    3:03 AM 05/10/2022   12:52 AM 05/12/2022    3:50 PM 05/23/2022    2:38 PM 08/22/2022   10:08 AM  Fall Risk  Falls in the past year?   1 1 0  Was there an injury with Fall?   1 1 0  Fall Risk Category Calculator   3 3 0  Fall Risk Category (Retired)   [employer]   (RETIRED) Patient Fall Risk Level High fall risk High fall risk High fall risk High fall risk   Patient at Risk for Falls Due to   History of fall(s);Impaired balance/gait Impaired balance/gait;History of fall(s) History of fall(s);Impaired balance/gait;Impaired mobility  Fall risk Follow up   Falls evaluation completed Falls evaluation completed Falls evaluation completed   Functional Status Survey:    Vitals:   09/26/22 1042  BP: 115/62  Pulse: 68  Resp: 18  Temp: (!) 97.4 F (36.3 C)  SpO2: 95%  Weight: 86 lb 1.6 oz (39.1 kg)  Height: 4\' 10"  (1.473 m)   Body mass index is 17.99 kg/m. Physical Exam Vitals and nursing note reviewed.  Constitutional:      Comments: frail  HENT:     Head: Normocephalic and atraumatic.     Nose: Nose normal.     Mouth/Throat:     Mouth: Mucous membranes are moist.  Eyes:     Extraocular Movements: Extraocular movements intact.     Conjunctiva/sclera: Conjunctivae normal.     Pupils: Pupils are equal, round, and reactive to light.  Cardiovascular:     Rate and Rhythm: Normal rate and regular rhythm.     Heart sounds: No murmur heard. Pulmonary:     Breath sounds: Wheezing present. No rhonchi or rales.     Comments: O2 via [place],  diffused expiratory wheezes R+L lungs.  Abdominal:     Palpations: Abdomen  is soft.     Tenderness: There is no abdominal tenderness.  Musculoskeletal:     Cervical back: Normal range of motion and neck supple.     Right lower leg: No edema.     Left lower leg: No edema.     Comments: Left wrist splint.   Skin:    General: Skin is warm and dry.  Neurological:     General: No focal deficit present.     Mental Status: She is alert.     Motor: No weakness.     Coordination: Coordination normal.     Gait: Gait abnormal.     Comments: Oriented to person, her room on unit.   Psychiatric:        Mood and Affect: Mood normal.    Labs reviewed: Recent Labs    10/12/21 0318 10/24/21 0023 01/29/22 2306 01/30/22 1610 01/31/22 2244 02/01/22 0237 02/02/22 0801 04/10/22 1410  NA 142   < > 140   < > 137  --  136 142  K 3.6   < > 2.9*   < > 4.1  --  3.9 4.1  CL 109   < > 100   < > 102  --  105 103  CO2 28   < > 31   < > 28  --  22 26  GLUCOSE 87   < > 128*   < > 80  --  80 95  BUN 14   < > 17   < > 19  --  19 18  CREATININE 0.95   < > 1.11*   < > 1.30*  --  1.10* 0.92  CALCIUM 8.8*   < > 9.1   < > 8.9  --  8.6* 9.6  MG 2.3  --  1.7  --   --  2.1  --   --   PHOS  --   --  2.4*  --   --  4.1  --   --    < > = values in this interval not displayed.   Recent Labs    10/12/21 0318 11/28/21 1106 01/08/22 1521 04/10/22 1410  AST ALT ALKPHOS 42  --  49 76  BILITOT 0.7 0.3 0.3 <0.2  PROT 5.5* 6.5 5.7* 6.1  ALBUMIN 3.0*  --  3.0* 3.8   Recent Labs    01/08/22 1521 01/29/22 2306 01/30/22 1610 02/01/22 0237 02/02/22 0801 04/10/22 1410  WBC 7.3 10.3   < > 7.0 6.9 6.3  NEUTROABS 5.1 8.6*  --  4.2  --   --   HGB 9.7* 10.9*   < > 10.3* 10.0* 10.2*  HCT 30.6* 34.4*   < > 32.1* 32.4* 31.4*  MCV 93.6 92.2   < > 92.8 94.2 91  PLT 171 169   < > 180 187 231   < > = values in this interval not displayed.   Lab Results  Component Value Date   TSH 2.88  11/28/2021   Lab Results  Component Value Date   HGBA1C 5.2 07/09/2017   Lab Results  Component Value Date   CHOL 206 (H) 11/28/2021   HDL 68 11/28/2021   LDLCALC 115 (H) 11/28/2021   TRIG 124 11/28/2021   CHOLHDL 3.0 11/28/2021    Significant Diagnostic Results in last 30 days:  No results found.  Assessment/Plan Adult failure to thrive  under hospice care. Bun/creat 19/0.76 08/29/22  GE reflux  takes Pantoprazole, prn Phenergan, Hgb 10.2 04/10/22  Slow transit constipation takes MiraLax, Colace, Prn Dulcolax suppository,   Senile dementia (HCC)   Cognitive impairment, takes ASA, off Donepezil, on Namenda, CT head 05/10/22 Left frontal scalp hematoma. Chronic atrophic and ischemic changes without acute abnormality.   Spinal stenosis of lumbar region   Chronic back pain, Hx of compression fx, managed with Tramadol, Tylenol, Lidocaine patch,  off Gabapentin  COPD, severe (HCC)  hypoxia, chronic respiratory failure, followed by Pulmonology, takes Atrovent Neb, Budesonide inh, DuoNeb,  O2, Flonase  Anxiety  takes Lexapro, Trazodone, Mirtazapine, prn Alprazolam.      Family/ staff Communication: plan of care reviewed with the patient and charge nurse.   Labs/tests ordered:  none  Time spend 35 minutes.

## 2022-09-26 NOTE — Assessment & Plan Note (Signed)
takes Lexapro, Trazodone, Mirtazapine, prn Alprazolam.  

## 2022-09-26 NOTE — Assessment & Plan Note (Signed)
takes Pantoprazole, prn Phenergan, Hgb 10.2 04/10/22 

## 2022-09-26 NOTE — Assessment & Plan Note (Signed)
Cognitive impairment, takes ASA, off Donepezil, on Namenda, CT head 05/10/22 Left frontal scalp hematoma. Chronic atrophic and ischemic changes without acute abnormality.  

## 2022-09-26 NOTE — Assessment & Plan Note (Signed)
under hospice care. Bun/creat 19/0.76 08/29/22

## 2022-09-26 NOTE — Assessment & Plan Note (Signed)
takes MiraLax, Colace, Prn Dulcolax suppository,

## 2022-09-28 ENCOUNTER — Other Ambulatory Visit: Payer: Self-pay | Admitting: Emergency Medicine

## 2022-09-29 ENCOUNTER — Ambulatory Visit: Payer: Medicare PPO | Admitting: Cardiovascular Disease

## 2022-09-29 ENCOUNTER — Ambulatory Visit (HOSPITAL_COMMUNITY): Payer: Medicare PPO

## 2022-10-15 ENCOUNTER — Non-Acute Institutional Stay (SKILLED_NURSING_FACILITY): Payer: Medicare PPO | Admitting: Nurse Practitioner

## 2022-10-15 DIAGNOSIS — J449 Chronic obstructive pulmonary disease, unspecified: Secondary | ICD-10-CM

## 2022-10-15 DIAGNOSIS — F0390 Unspecified dementia without behavioral disturbance: Secondary | ICD-10-CM

## 2022-10-15 DIAGNOSIS — R627 Adult failure to thrive: Secondary | ICD-10-CM

## 2022-10-15 DIAGNOSIS — F419 Anxiety disorder, unspecified: Secondary | ICD-10-CM

## 2022-10-15 DIAGNOSIS — L03032 Cellulitis of left toe: Secondary | ICD-10-CM | POA: Diagnosis not present

## 2022-10-15 DIAGNOSIS — K5901 Slow transit constipation: Secondary | ICD-10-CM

## 2022-10-15 DIAGNOSIS — D508 Other iron deficiency anemias: Secondary | ICD-10-CM

## 2022-10-15 DIAGNOSIS — M159 Polyosteoarthritis, unspecified: Secondary | ICD-10-CM

## 2022-10-15 DIAGNOSIS — K219 Gastro-esophageal reflux disease without esophagitis: Secondary | ICD-10-CM | POA: Diagnosis not present

## 2022-10-15 NOTE — Progress Notes (Signed)
Location:   [facility] Room Number: 92 Place of Service:  SNF (31) Provider: Chipper Oman NP  No primary care provider on file.  Patient Care Team: Katherine Hazel, MD as PCP - Cardiology (Cardiology) [name], MD as Consulting Physician (Pulmonary Disease)  Extended Emergency Contact Information Primary Emergency Contact: Katherine [name] Address: [address]          [place], [place] [address] Katherine [name] Home Phone: [phone] Mobile Phone: [phone] Relation: Spouse Secondary Emergency Contact: Katherine [name] Mobile Phone: [phone] Relation: Granddaughter  Code Status: DNR Goals of care: Advanced Directive information    09/26/2022   10:46 AM  Advanced Directives  Does Patient Have a Medical Advance Directive? Yes  Type of Estate agent of [place];Living will;Out of facility DNR (pink MOST or yellow form)  Does patient want to make changes to medical advance directive? No - Patient declined  Copy of Healthcare Power of Attorney in Chart? Yes - validated most recent copy scanned in chart (See row information)  Pre-existing out of facility DNR order (yellow form or pink MOST form) Pink MOST/Yellow Form most recent copy in chart - Physician notified to receive inpatient order     Chief Complaint  Patient presents with   Acute Visit    Left great toe pain.     HPI:  Pt is a 89 y.o. female seen today for an acute visit for left toe pain, small open area at the base of the left great toe, odorous drainage, redness, warmth, tenderness noted in the area, able to move the left great toe.    Adult failure to thrive, under hospice care. Bun/creat 19/0.76 08/29/22             GERD, takes Pantoprazole, prn Phenergan, Hgb 10.2 04/10/22             Constipation, takes MiraLax, Colace, Prn Dulcolax suppository,              Cognitive impairment, takes ASA, off Donepezil, on Namenda, CT head 05/10/22 Left frontal scalp  hematoma. Chronic atrophic and ischemic changes without acute abnormality.              Chronic back pain, Hx of compression fx, managed with Tramadol, Tylenol, Lidocaine patch,  off Gabapentin             COPD, hypoxia, chronic respiratory failure, followed by Pulmonology, takes Budesonide inh, DuoNeb,  O2, Flonase             Depression, takes Lexapro, Trazodone, Mirtazapine, prn Alprazolam.              Gait abnormality, needs assistance with transfer, ambulates with walker with SBA.              Anemia, takes Fe, Hgb 10.2 04/10/22                             Past Medical History:  Diagnosis Date   Allergy    Anemia    Arthritis    Asthma    patient denies   B12 deficiency    COPD (chronic obstructive pulmonary disease) (HCC)    Depression    not recent   Diverticulosis    Esophageal stricture    Hepatic cyst 01/30/2010   Hiatal hernia 1985   patient unaware   Hx of adenomatous colonic polyps 2006   Hyperlipidemia    IBS (irritable  bowel syndrome)    PONV (postoperative nausea and vomiting)    no nausea or vomitting after surgery 07/2014   Positive PPD    PUD (peptic ulcer disease)    S/P TAVR (transcatheter aortic valve replacement) 08/13/2021   s/p TAVR with a 23mm Edwards S3UR via the right carotid approach by Dr. Clifton James & Dr. Laneta Simmers   Severe aortic stenosis    Past Surgical History:  Procedure Laterality Date   ABDOMINAL HYSTERECTOMY     abnormal bleeding   APPENDECTOMY     BARTHOLIN GLAND CYST EXCISION     x2   BREAST BIOPSY Bilateral    Milk glnd   CATARACT EXTRACTION W/ INTRAOCULAR LENS  IMPLANT, BILATERAL Bilateral    COLONOSCOPY     COLONOSCOPY W/ POLYPECTOMY     HARDWARE REMOVAL N/A 12/13/2015   Procedure: Removal of HARDWARE  Lumbar Five-Sacral One ;  Surgeon: Donalee Citrin, MD;  Location: [facility];  Service: Neurosurgery;  Laterality: N/A;   IR KYPHO LUMBAR INC FX REDUCE BONE BX UNI/BIL CANNULATION INC/IMAGING  07/13/2019   PERICARDIAL WINDOW   08/13/2021   Procedure: PERICARDIAL WINDOW;  Surgeon: Katherine Hazel, MD;  Location: [facility] OR;  Service: Open Heart Surgery;;   RIGHT/LEFT HEART CATH AND CORONARY ANGIOGRAPHY N/A 07/11/2021   Procedure: RIGHT/LEFT HEART CATH AND CORONARY ANGIOGRAPHY;  Surgeon: Katherine Hazel, MD;  [facility];  Service: Cardiovascular;  Laterality: N/A;   TEE WITHOUT CARDIOVERSION N/A 08/13/2021   Procedure: TRANSESOPHAGEAL ECHOCARDIOGRAM (TEE);  Surgeon: Katherine Hazel, MD;  Location: [facility] OR;  Service: Open Heart Surgery;  Laterality: N/A;   TRANSCATHETER AORTIC VALVE REPLACEMENT, CAROTID Right 08/13/2021   Procedure: Transcatheter Aortic Valve Replacement-Carotid;  Surgeon: Katherine Hazel, MD;  Location: [facility] OR;  Service: Open Heart Surgery;  Laterality: Right;   UPPER GI ENDOSCOPY     VAGOTOMY AND PYLOROPLASTY     in [place], [place]    No Known Allergies  Allergies as of 10/15/2022   No Known Allergies      Medication List        Accurate as of Oct 15, 2022 11:59 PM. If you have any questions, ask your nurse or doctor.          albuterol 108 (90 Base) MCG/ACT inhaler Commonly known as: VENTOLIN HFA INHALE 2 PUFFS INTO THE LUNGS 4 (FOUR) TIMES DAILY AS NEEDED FOR WHEEZING OR SHORTNESS OF BREATH.   ALPRAZolam 0.25 MG tablet Commonly known as: XANAX Take 1 tablet (0.25 mg total) by mouth at bedtime as needed for anxiety or sleep.   aspirin EC 81 MG tablet Take 81 mg by mouth daily. Swallow whole.   bisacodyl 10 MG suppository Commonly known as: DULCOLAX Place 1 suppository (10 mg total) rectally every 12 (twelve) hours as needed for moderate constipation or severe constipation.   budesonide 0.5 MG/2ML nebulizer solution Commonly known as: PULMICORT TAKE 2 MLS (0.5 MG TOTAL) BY NEBULIZATION 2 (TWO) TIMES DAILY.   docusate sodium 100 MG capsule Commonly known as: COLACE Take 1 capsule (100 mg total) by mouth 2 (two) times daily.   escitalopram  5 MG tablet Commonly known as: LEXAPRO Take 20 mg by mouth daily.   ferrous sulfate 325 (65 FE) MG EC tablet Take 325 mg by mouth daily.   fluticasone 50 MCG/ACT nasal spray Commonly known as: FLONASE Place 1 spray into both nostrils daily.   ipratropium 0.02 % nebulizer solution Commonly known as: ATROVENT Take 0.5 mg by  nebulization 4 (four) times daily.   memantine 10 MG tablet Commonly known as: NAMENDA Take 10 mg by mouth 2 (two) times daily.   mirtazapine 7.5 MG tablet Commonly known as: REMERON Take 7.5 mg by mouth at bedtime.   multivitamin with minerals tablet Take 1 tablet by mouth daily.   pantoprazole 40 MG tablet Commonly known as: PROTONIX TAKE 1 TABLET BY MOUTH EVERY DAY   polyethylene glycol 17 g packet Commonly known as: MIRALAX / GLYCOLAX Take 17 g by mouth daily.   promethazine 25 MG tablet Commonly known as: PHENERGAN Take 25 mg by mouth every 6 (six) hours as needed for nausea or vomiting.   traMADol-acetaminophen 37.5-325 MG tablet Commonly known as: ULTRACET Take 1 tablet by mouth 3 (three) times daily.   traZODone 150 MG tablet Commonly known as: DESYREL Take 150 mg by mouth at bedtime. Give 0.5 tablet        Review of Systems  Constitutional:  Positive for fatigue. Negative for appetite change and fever.  HENT:  Positive for hearing loss. Negative for congestion and trouble swallowing.   Eyes:  Negative for visual disturbance.  Respiratory:  Positive for cough, shortness of breath and wheezing.   Cardiovascular:  Negative for leg swelling.  Gastrointestinal:  Negative for abdominal pain, constipation, nausea and vomiting.  Genitourinary:  Negative for dysuria and urgency.  Musculoskeletal:  Positive for arthralgias, back pain and gait problem.  Skin:  Positive for wound. Negative for color change.       The left toe pain, small open area at the base of the left great toe nail, odorous drainage, redness, warmth, tenderness noted in  the area, able to move the left great toe.    Neurological:  Negative for dizziness, speech difficulty, weakness and headaches.  Psychiatric/Behavioral:  Positive for confusion. Negative for behavioral problems and sleep disturbance. The patient is not nervous/anxious.     Immunization History  Administered Date(s) Administered   Fluad Quad(high Dose 65+) 03/18/2020, 05/15/2022   Influenza Split 02/20/2018   Influenza Whole 01/29/2010   Influenza, High Dose Seasonal PF 04/13/2014, 02/12/2016, 03/10/2017, 03/19/2018, 04/11/2019, 05/15/2022   Influenza-Unspecified 03/16/2014, 03/05/2021   Moderna Sars-Covid-2 Vaccination 04/17/2022   PFIZER(Purple Top)SARS-COV-2 Vaccination 08/08/2019, 08/29/2019, 03/18/2020, 02/26/2021   Pneumococcal Conjugate-13 05/12/2016   Pneumococcal Polysaccharide-23 08/08/1996   Tdap 04/10/2003, 02/12/2022   Zoster Recombinat (Shingrix) 02/22/2019   Pertinent  Health Maintenance Due  Topic Date Due   INFLUENZA VACCINE  01/15/2023   DEXA SCAN  Completed      02/12/2022    3:03 AM 05/10/2022   12:52 AM 05/12/2022    3:50 PM 05/23/2022    2:38 PM 08/22/2022   10:08 AM  Fall Risk  Falls in the past year?   1 1 0  Was there an injury with Fall?   1 1 0  Fall Risk Category Calculator   3 3 0  Fall Risk Category (Retired)   [employer]   (RETIRED) Patient Fall Risk Level High fall risk High fall risk High fall risk High fall risk   Patient at Risk for Falls Due to   History of fall(s);Impaired balance/gait Impaired balance/gait;History of fall(s) History of fall(s);Impaired balance/gait;Impaired mobility  Fall risk Follow up   Falls evaluation completed Falls evaluation completed Falls evaluation completed   Functional Status Survey:    Vitals:   10/15/22 1619  BP: (!) 122/47  Pulse: 60  Resp: 20  Temp: 97.8 F (36.6 C)  SpO2: 98%  There is no height or weight on file to calculate BMI. Physical Exam Vitals and nursing note reviewed.  Constitutional:       Comments: frail  HENT:     Head: Normocephalic and atraumatic.     Nose: Nose normal.     Mouth/Throat:     Mouth: Mucous membranes are moist.  Eyes:     Extraocular Movements: Extraocular movements intact.     Conjunctiva/sclera: Conjunctivae normal.     Pupils: Pupils are equal, round, and reactive to light.  Cardiovascular:     Rate and Rhythm: Normal rate and regular rhythm.     Heart sounds: No murmur heard. Pulmonary:     Breath sounds: Wheezing present. No rhonchi or rales.     Comments: O2 via [place], diffused expiratory wheezes R+L lungs.  Abdominal:     Palpations: Abdomen is soft.     Tenderness: There is no abdominal tenderness.  Musculoskeletal:     Cervical back: Normal range of motion and neck supple.     Right lower leg: No edema.     Left lower leg: No edema.     Comments: Left wrist splint.   Skin:    General: Skin is warm and dry.     Findings: Erythema present.     Comments: The left toe pain, small open area at the base of the left great toe nail, odorous drainage, redness, warmth, tenderness noted in the area, able to move the left great toe.     Neurological:     General: No focal deficit present.     Mental Status: She is alert.     Motor: No weakness.     Coordination: Coordination normal.     Gait: Gait abnormal.     Comments: Oriented to person, her room on unit.   Psychiatric:        Mood and Affect: Mood normal.     Labs reviewed: Recent Labs    01/29/22 2306 01/30/22 1610 01/31/22 2244 02/01/22 0237 02/02/22 0801 04/10/22 1410  NA 140   < > 137  --  136 142  K 2.9*   < > 4.1  --  3.9 4.1  CL 100   < > 102  --  105 103  CO2 31   < > 28  --  22 26  GLUCOSE 128*   < > 80  --  80 95  BUN 17   < > 19  --  19 18  CREATININE 1.11*   < > 1.30*  --  1.10* 0.92  CALCIUM 9.1   < > 8.9  --  8.6* 9.6  MG 1.7  --   --  2.1  --   --   PHOS 2.4*  --   --  4.1  --   --    < > = values in this interval not displayed.   Recent Labs     11/28/21 1106 01/08/22 1521 04/10/22 1410  AST 22 26 24   ALT 10 14 13   ALKPHOS  --  49 76  BILITOT 0.3 0.3 <0.2  PROT 6.5 5.7* 6.1  ALBUMIN  --  3.0* 3.8   Recent Labs    01/08/22 1521 01/29/22 2306 01/30/22 1610 02/01/22 0237 02/02/22 0801 04/10/22 1410  WBC 7.3 10.3   < > 7.0 6.9 6.3  NEUTROABS 5.1 8.6*  --  4.2  --   --   HGB 9.7* 10.9*   < > 10.3* 10.0* 10.2*  HCT 30.6* 34.4*   < > 32.1* 32.4* 31.4*  MCV 93.6 92.2   < > 92.8 94.2 91  PLT 171 169   < > 180 187 231   < > = values in this interval not displayed.   Lab Results  Component Value Date   TSH 2.88 11/28/2021   Lab Results  Component Value Date   HGBA1C 5.2 07/09/2017   Lab Results  Component Value Date   CHOL 206 (H) 11/28/2021   HDL 68 11/28/2021   LDLCALC 115 (H) 11/28/2021   TRIG 124 11/28/2021   CHOLHDL 3.0 11/28/2021    Significant Diagnostic Results in last 30 days:  No results found.  Assessment/Plan: Paronychia of great toe, left Doxycycline 100mg  bid x 7 days.   Adult failure to thrive under hospice care. Bun/creat 19/0.76 08/29/22  GE reflux Stable,  takes Pantoprazole, prn Phenergan, Hgb 10.2 04/10/22  Slow transit constipation Stable,  takes MiraLax, Colace, Prn Dulcolax suppository,   Senile dementia (HCC)  takes ASA, off Donepezil, on Namenda, CT head 05/10/22 Left frontal scalp hematoma. Chronic atrophic and ischemic changes without acute abnormality.   Osteoarthritis, multiple sites Chronic back pain, Hx of compression fx, managed with Tramadol, Tylenol, Lidocaine patch,  off Gabapentin  COPD, severe (HCC)  hypoxia, chronic respiratory failure, followed by Pulmonology, takes Budesonide inh, DuoNeb,  O2, Flonase  Anxiety Better,  takes Lexapro, Trazodone, Mirtazapine, prn Alprazolam.   Iron deficiency anemia takes Fe, Hgb 10.2 04/10/22   Family/ staff Communication: plan of the care reviewed with the patient and charge nurse.   Labs/tests ordered:  none  Time  spend 35 minutes.

## 2022-10-15 NOTE — Assessment & Plan Note (Signed)
Doxycycline 100mg bid x 7 days.  

## 2022-10-16 ENCOUNTER — Encounter: Payer: Self-pay | Admitting: Nurse Practitioner

## 2022-10-16 NOTE — Assessment & Plan Note (Signed)
Stable, takes Pantoprazole, prn Phenergan, Hgb 10.2 04/10/22 

## 2022-10-16 NOTE — Assessment & Plan Note (Signed)
Better,  takes Lexapro, Trazodone, Mirtazapine, prn Alprazolam.

## 2022-10-16 NOTE — Assessment & Plan Note (Signed)
takes Fe, Hgb 10.2 04/10/22 

## 2022-10-16 NOTE — Assessment & Plan Note (Signed)
Stable,  takes MiraLax, Colace, Prn Dulcolax suppository,  

## 2022-10-16 NOTE — Assessment & Plan Note (Signed)
under hospice care. Bun/creat 19/0.76 08/29/22 

## 2022-10-16 NOTE — Assessment & Plan Note (Signed)
hypoxia, chronic respiratory failure, followed by Pulmonology, takes Budesonide inh, DuoNeb,  O2, Flonase

## 2022-10-16 NOTE — Assessment & Plan Note (Signed)
takes ASA, off Donepezil, on Namenda, CT head 05/10/22 Left frontal scalp hematoma. Chronic atrophic and ischemic changes without acute abnormality.  

## 2022-10-16 NOTE — Assessment & Plan Note (Signed)
Chronic back pain, Hx of compression fx, managed with Tramadol, Tylenol, Lidocaine patch,  off Gabapentin 

## 2022-11-04 ENCOUNTER — Non-Acute Institutional Stay (SKILLED_NURSING_FACILITY): Admitting: Family Medicine

## 2022-11-04 DIAGNOSIS — G8929 Other chronic pain: Secondary | ICD-10-CM

## 2022-11-04 DIAGNOSIS — J9611 Chronic respiratory failure with hypoxia: Secondary | ICD-10-CM

## 2022-11-04 DIAGNOSIS — M25531 Pain in right wrist: Secondary | ICD-10-CM

## 2022-11-04 DIAGNOSIS — M545 Low back pain, unspecified: Secondary | ICD-10-CM

## 2022-11-04 DIAGNOSIS — F419 Anxiety disorder, unspecified: Secondary | ICD-10-CM

## 2022-11-04 DIAGNOSIS — R627 Adult failure to thrive: Secondary | ICD-10-CM

## 2022-11-04 DIAGNOSIS — F0390 Unspecified dementia without behavioral disturbance: Secondary | ICD-10-CM

## 2022-11-04 DIAGNOSIS — M159 Polyosteoarthritis, unspecified: Secondary | ICD-10-CM

## 2022-11-04 NOTE — Progress Notes (Signed)
Location:      Place of Service:     Provider: Jacalyn Lefevre MD  Code Status:  Goals of Care:     09/26/2022   10:46 AM  Advanced Directives  Does Patient Have a Medical Advance Directive? Yes  Type of Estate agent of [place];Living will;Out of facility DNR (pink MOST or yellow form)  Does patient want to make changes to medical advance directive? No - Patient declined  Copy of Healthcare Power of Attorney in Chart? Yes - validated most recent copy scanned in chart (See row information)  Pre-existing out of facility DNR order (yellow form or pink MOST form) Pink MOST/Yellow Form most recent copy in chart - Physician notified to receive inpatient order     No chief complaint on file.   HPI: Patient is a 89 y.o. female seen today for medical management of chronic diseases.  Including COPD, status post TAVR, d and cognitive impairment depression, adult failure to thrive, gait abnormality.  She continues to lay in bed most of the time.  Her husband comes twice a day to feed her as she has fairly severe arthritis in her hands and fingers and is unable to feed self. She is on multiple meds for COPD and followed by pulmonology meds include Atrovent ,budesonide, DuoNeb.  There have been some behavioral issues.  Have had discussions with husband.  Behaviors seem to have improved with addition of alprazolam at nighttime .  Past Medical History:  Diagnosis Date   Allergy    Anemia    Arthritis    Asthma    patient denies   B12 deficiency    COPD (chronic obstructive pulmonary disease) (HCC)    Depression    not recent   Diverticulosis    Esophageal stricture    Hepatic cyst 01/30/2010   Hiatal hernia 1985   patient unaware   Hx of adenomatous colonic polyps 2006   Hyperlipidemia    IBS (irritable bowel syndrome)    PONV (postoperative nausea and vomiting)    no nausea or vomitting after surgery 07/2014   Positive PPD    PUD (peptic ulcer disease)     S/P TAVR (transcatheter aortic valve replacement) 08/13/2021   s/p TAVR with a 23mm Edwards S3UR via the right carotid approach by Dr. Clifton James & Dr. Laneta Simmers   Severe aortic stenosis     Past Surgical History:  Procedure Laterality Date   ABDOMINAL HYSTERECTOMY     abnormal bleeding   APPENDECTOMY     BARTHOLIN GLAND CYST EXCISION     x2   BREAST BIOPSY Bilateral    Milk glnd   CATARACT EXTRACTION W/ INTRAOCULAR LENS  IMPLANT, BILATERAL Bilateral    COLONOSCOPY     COLONOSCOPY W/ POLYPECTOMY     HARDWARE REMOVAL N/A 12/13/2015   Procedure: Removal of HARDWARE  Lumbar Five-Sacral One ;  Surgeon: Donalee Citrin, MD;  Location: [facility];  Service: Neurosurgery;  Laterality: N/A;   IR KYPHO LUMBAR INC FX REDUCE BONE BX UNI/BIL CANNULATION INC/IMAGING  07/13/2019   PERICARDIAL WINDOW  08/13/2021   Procedure: PERICARDIAL WINDOW;  Surgeon: [name], MD;  Location: [facility] OR;  Service: Open Heart Surgery;;   RIGHT/LEFT HEART CATH AND CORONARY ANGIOGRAPHY N/A 07/11/2021   Procedure: RIGHT/LEFT HEART CATH AND CORONARY ANGIOGRAPHY;  Surgeon: [name], MD;  [facility];  Service: Cardiovascular;  Laterality: N/A;   TEE WITHOUT CARDIOVERSION N/A 08/13/2021   Procedure: TRANSESOPHAGEAL ECHOCARDIOGRAM (  TEE);  Surgeon: [name], MD;  [facility] OR;  Service: Open Heart Surgery;  Laterality: N/A;   TRANSCATHETER AORTIC VALVE REPLACEMENT, CAROTID Right 08/13/2021   Procedure: Transcatheter Aortic Valve Replacement-Carotid;  Surgeon: [name], MD;  Location: [facility] OR;  Service: Open Heart Surgery;  Laterality: Right;   UPPER GI ENDOSCOPY     VAGOTOMY AND PYLOROPLASTY     in [place], [place]    No Known Allergies  Outpatient Encounter Medications as of 11/04/2022  Medication Sig   albuterol (VENTOLIN HFA) 108 (90 Base) MCG/ACT inhaler INHALE 2 PUFFS INTO THE LUNGS 4 (FOUR) TIMES DAILY AS NEEDED FOR WHEEZING OR SHORTNESS OF BREATH.    ALPRAZolam (XANAX) 0.25 MG tablet Take 1 tablet (0.25 mg total) by mouth at bedtime as needed for anxiety or sleep.   aspirin EC 81 MG tablet Take 81 mg by mouth daily. Swallow whole.   bisacodyl (DULCOLAX) 10 MG suppository Place 1 suppository (10 mg total) rectally every 12 (twelve) hours as needed for moderate constipation or severe constipation.   budesonide (PULMICORT) 0.5 MG/2ML nebulizer solution TAKE 2 MLS (0.5 MG TOTAL) BY NEBULIZATION 2 (TWO) TIMES DAILY.   docusate sodium (COLACE) 100 MG capsule Take 1 capsule (100 mg total) by mouth 2 (two) times daily.   escitalopram (LEXAPRO) 5 MG tablet Take 20 mg by mouth daily.   ferrous sulfate 325 (65 FE) MG EC tablet Take 325 mg by mouth daily.   fluticasone (FLONASE) 50 MCG/ACT nasal spray Place 1 spray into both nostrils daily.   ipratropium (ATROVENT) 0.02 % nebulizer solution Take 0.5 mg by nebulization 4 (four) times daily.   memantine (NAMENDA) 10 MG tablet Take 10 mg by mouth 2 (two) times daily.   mirtazapine (REMERON) 7.5 MG tablet Take 7.5 mg by mouth at bedtime.   Multiple Vitamins-Minerals (MULTIVITAMIN WITH MINERALS) tablet Take 1 tablet by mouth daily.   pantoprazole (PROTONIX) 40 MG tablet TAKE 1 TABLET BY MOUTH EVERY DAY   polyethylene glycol (MIRALAX / GLYCOLAX) 17 g packet Take 17 g by mouth daily.   promethazine (PHENERGAN) 25 MG tablet Take 25 mg by mouth every 6 (six) hours as needed for nausea or vomiting.   traMADol-acetaminophen (ULTRACET) 37.5-325 MG tablet Take 1 tablet by mouth 3 (three) times daily.   traZODone (DESYREL) 150 MG tablet Take 150 mg by mouth at bedtime. Give 0.5 tablet   No facility-administered encounter medications on file as of 11/04/2022.    Review of Systems:  Review of Systems  Constitutional: Negative.   HENT: Negative.    Respiratory:  Positive for shortness of breath.   Cardiovascular: Negative.   Gastrointestinal: Negative.   Genitourinary:  Negative for pelvic pain.  Musculoskeletal:   Positive for arthralgias and gait problem.  Neurological:  Positive for weakness.  Psychiatric/Behavioral:  Positive for confusion. Negative for behavioral problems. The patient is nervous/anxious.   All other systems reviewed and are negative.   Health Maintenance  Topic Date Due   Zoster Vaccines- Shingrix (2 of 2) 04/19/2019   COVID-19 Vaccine (6 - 2023-24 season) 06/12/2022   INFLUENZA VACCINE  01/15/2023   DTaP/Tdap/Td (3 - Td or Tdap) 02/13/2032   Pneumonia Vaccine 33+ Years old  Completed   DEXA SCAN  Completed   HPV VACCINES  Aged Out    Physical Exam: There were no vitals filed for this visit. There is no height or weight on file to calculate BMI. Physical Exam Vitals and nursing note reviewed.  Constitutional:  Appearance: She is ill-appearing.  Cardiovascular:     Rate and Rhythm: Normal rate and regular rhythm.  Pulmonary:     Effort: Pulmonary effort is normal.     Breath sounds: Normal breath sounds.  Abdominal:     General: Bowel sounds are normal.     Palpations: Abdomen is soft.  Neurological:     General: No focal deficit present.     Mental Status: She is alert.     Comments: Oriented to self and place  Psychiatric:        Mood and Affect: Mood normal.        Behavior: Behavior normal.     Labs reviewed: Basic Metabolic Panel: Recent Labs    11/28/21 1106 01/08/22 1521 01/29/22 2306 01/30/22 1610 01/31/22 2244 02/01/22 0237 02/02/22 0801 04/10/22 1410  NA 147*   < > 140   < > 137  --  136 142  K 4.5   < > 2.9*   < > 4.1  --  3.9 4.1  CL 110   < > 100   < > 102  --  105 103  CO2 25   < > 31   < > 28  --  22 26  GLUCOSE 96   < > 128*   < > 80  --  80 95  BUN 18   < > 17   < > 19  --  19 18  CREATININE 1.10*   < > 1.11*   < > 1.30*  --  1.10* 0.92  CALCIUM 9.7   < > 9.1   < > 8.9  --  8.6* 9.6  MG  --   --  1.7  --   --  2.1  --   --   PHOS  --   --  2.4*  --   --  4.1  --   --   TSH 2.88  --   --   --   --   --   --   --    < > =  values in this interval not displayed.   Liver Function Tests: Recent Labs    11/28/21 1106 01/08/22 1521 04/10/22 1410  AST 22 26 24   ALT 10 14 13   ALKPHOS  --  49 76  BILITOT 0.3 0.3 <0.2  PROT 6.5 5.7* 6.1  ALBUMIN  --  3.0* 3.8   No results for input(s): "LIPASE", "AMYLASE" in the last 8760 hours. No results for input(s): "AMMONIA" in the last 8760 hours. CBC: Recent Labs    01/08/22 1521 01/29/22 2306 01/30/22 1610 02/01/22 0237 02/02/22 0801 04/10/22 1410  WBC 7.3 10.3   < > 7.0 6.9 6.3  NEUTROABS 5.1 8.6*  --  4.2  --   --   HGB 9.7* 10.9*   < > 10.3* 10.0* 10.2*  HCT 30.6* 34.4*   < > 32.1* 32.4* 31.4*  MCV 93.6 92.2   < > 92.8 94.2 91  PLT 171 169   < > 180 187 231   < > = values in this interval not displayed.   Lipid Panel: Recent Labs    11/28/21 1106  CHOL 206*  HDL 68  LDLCALC 115*  TRIG 124  CHOLHDL 3.0   Lab Results  Component Value Date   HGBA1C 5.2 07/09/2017    Procedures since last visit: No results found.  Assessment/Plan 1. Acute pain of right wrist Appears to be rheumatoid has been on  braces before which were not well-tolerated  2. Adult failure to thrive Patient is on hospice and mirtazapine at bedtime  3. Anxiety Alprazolam at bedtime also Lexapro and trazodone  4. Chronic low back pain, unspecified back pain laterality, unspecified whether sciatica present Continue with medication for pain, ultrasound  5. Chronic respiratory failure with hypoxia (HCC)  On oxygen 24-7.  Also budesonide, Atrovent 6. Primary osteoarthritis involving multiple joints Takes Ultracet; will ask for PT evaluation.  Husband feels like hands and fingers are looking worse in terms of early contractures  7. Senile dementia (HCC) There are some behaviors.  Have tried to adjust medicines including addition of alprazolam and memantine    Labs/tests ordered:  * No order type specified * Next appt:  Visit date not found  Bertram Millard. Hyacinth Meeker,  MD [facility] [address] [place], [place] [address] Office [phone]

## 2022-11-04 NOTE — Addendum Note (Signed)
Addended by: Frederica Kuster on: 11/04/2022 02:31 PM   Modules accepted: Level of Service

## 2022-11-13 ENCOUNTER — Encounter: Payer: Self-pay | Admitting: Physician Assistant

## 2022-11-18 ENCOUNTER — Encounter: Payer: Self-pay | Admitting: Nurse Practitioner

## 2022-11-18 ENCOUNTER — Non-Acute Institutional Stay (SKILLED_NURSING_FACILITY): Payer: Medicare PPO | Admitting: Nurse Practitioner

## 2022-11-18 DIAGNOSIS — M159 Polyosteoarthritis, unspecified: Secondary | ICD-10-CM

## 2022-11-18 DIAGNOSIS — B351 Tinea unguium: Secondary | ICD-10-CM | POA: Insufficient documentation

## 2022-11-18 DIAGNOSIS — H0100B Unspecified blepharitis left eye, upper and lower eyelids: Secondary | ICD-10-CM

## 2022-11-18 DIAGNOSIS — H01003 Unspecified blepharitis right eye, unspecified eyelid: Secondary | ICD-10-CM | POA: Insufficient documentation

## 2022-11-18 DIAGNOSIS — D508 Other iron deficiency anemias: Secondary | ICD-10-CM

## 2022-11-18 DIAGNOSIS — F419 Anxiety disorder, unspecified: Secondary | ICD-10-CM

## 2022-11-18 DIAGNOSIS — R627 Adult failure to thrive: Secondary | ICD-10-CM | POA: Diagnosis not present

## 2022-11-18 DIAGNOSIS — H0100A Unspecified blepharitis right eye, upper and lower eyelids: Secondary | ICD-10-CM | POA: Diagnosis not present

## 2022-11-18 DIAGNOSIS — F0390 Unspecified dementia without behavioral disturbance: Secondary | ICD-10-CM

## 2022-11-18 DIAGNOSIS — K219 Gastro-esophageal reflux disease without esophagitis: Secondary | ICD-10-CM

## 2022-11-18 DIAGNOSIS — J449 Chronic obstructive pulmonary disease, unspecified: Secondary | ICD-10-CM

## 2022-11-18 DIAGNOSIS — K5901 Slow transit constipation: Secondary | ICD-10-CM

## 2022-11-18 NOTE — Progress Notes (Signed)
Location:  [facility] Room Number: NO/63 Place of Service:  SNF (31) Provider:  Kinlee Garrison X, NP   Patient Care Team: [name], MD as PCP - Cardiology (Cardiology) [name], MD as Consulting Physician (Pulmonary Disease)  Extended Emergency Contact Information Primary Emergency Contact: Schaaf,[name] Address: [address]          [place], [place] [address] Darden [name] Home Phone: [phone] Mobile Phone: [phone] Relation: Spouse Secondary Emergency Contact: [name] Mobile Phone: [phone] Relation: Granddaughter  Code Status:  DNR Goals of care: Advanced Directive information    11/18/2022   10:51 AM  Advanced Directives  Does Patient Have a Medical Advance Directive? Yes  Type of Estate agent of [place];Living will;Out of facility DNR (pink MOST or yellow form)  Does patient want to make changes to medical advance directive? No - Patient declined  Copy of Healthcare Power of Attorney in Chart? Yes - validated most recent copy scanned in chart (See row information)  Pre-existing out of facility DNR order (yellow form or pink MOST form) Pink MOST/Yellow Form most recent copy in chart - Physician notified to receive inpatient order     Chief Complaint  Patient presents with   Medical Management of Chronic Issues    Patient is here for a follow up for chronic conditions Patient is due for 2nd shingrix vaccine and covid booster     HPI:  Pt is a 89 y.o. female seen today for medical management of chronic diseases.    The R great toe nail yellow thick toe nail, trauma at the base, dry blood noted, no s/s of infection.   Crusted eyelashes, not injected   Adult failure to thrive, under hospice care. Bun/creat 19/0.76 08/29/22             GERD, takes Pantoprazole, prn Phenergan, Hgb 10.2 04/10/22             Constipation, takes MiraLax, Colace, Prn Dulcolax suppository,               Cognitive impairment, takes ASA, off Donepezil, on Namenda, CT head 05/10/22 Left frontal scalp hematoma. Chronic atrophic and ischemic changes without acute abnormality.              Chronic back pain, Hx of compression fx, managed with Tramadol, Tylenol, Lidocaine patch,  off Gabapentin             COPD, hypoxia, chronic respiratory failure, followed by Pulmonology, takes Budesonide inh, DuoNeb,  O2, Flonase             Depression, takes Lexapro, Trazodone, Mirtazapine, prn Alprazolam.              Gait abnormality, needs assistance with transfer, no longer ambulating.              Anemia, takes Fe, Hgb 10.2 04/10/22                             Past Medical History:  Diagnosis Date   Allergy    Anemia    Arthritis    Asthma    patient denies   B12 deficiency    COPD (chronic obstructive pulmonary disease) (HCC)    Depression    not recent   Diverticulosis    Esophageal stricture    Hepatic cyst 01/30/2010   Hiatal hernia 1985   patient unaware   Hx  of adenomatous colonic polyps 2006   Hyperlipidemia    IBS (irritable bowel syndrome)    PONV (postoperative nausea and vomiting)    no nausea or vomitting after surgery 07/2014   Positive PPD    PUD (peptic ulcer disease)    S/P TAVR (transcatheter aortic valve replacement) 08/13/2021   s/p TAVR with a 23mm Edwards S3UR via the right carotid approach by Dr. Clifton James & Dr. Laneta Simmers   Severe aortic stenosis    Past Surgical History:  Procedure Laterality Date   ABDOMINAL HYSTERECTOMY     abnormal bleeding   APPENDECTOMY     BARTHOLIN GLAND CYST EXCISION     x2   BREAST BIOPSY Bilateral    Milk glnd   CATARACT EXTRACTION W/ INTRAOCULAR LENS  IMPLANT, BILATERAL Bilateral    COLONOSCOPY     COLONOSCOPY W/ POLYPECTOMY     HARDWARE REMOVAL N/A 12/13/2015   Procedure: Removal of HARDWARE  Lumbar Five-Sacral One ;  Surgeon: Donalee Citrin, MD;  Location: [facility];  Service: Neurosurgery;  Laterality: N/A;   IR KYPHO LUMBAR INC FX  REDUCE BONE BX UNI/BIL CANNULATION INC/IMAGING  07/13/2019   PERICARDIAL WINDOW  08/13/2021   Procedure: PERICARDIAL WINDOW;  Surgeon: [name], MD;  Location: [facility] OR;  Service: Open Heart Surgery;;   RIGHT/LEFT HEART CATH AND CORONARY ANGIOGRAPHY N/A 07/11/2021   Procedure: RIGHT/LEFT HEART CATH AND CORONARY ANGIOGRAPHY;  Surgeon: [name], MD;  Location: [facility];  Service: Cardiovascular;  Laterality: N/A;   TEE WITHOUT CARDIOVERSION N/A 08/13/2021   Procedure: TRANSESOPHAGEAL ECHOCARDIOGRAM (TEE);  Surgeon: [name], MD;  Location: [facility];  Service: Open Heart Surgery;  Laterality: N/A;   TRANSCATHETER AORTIC VALVE REPLACEMENT, CAROTID Right 08/13/2021   Procedure: Transcatheter Aortic Valve Replacement-Carotid;  Surgeon: [name], MD;  Location: [facility];  Service: Open Heart Surgery;  Laterality: Right;   UPPER GI ENDOSCOPY     VAGOTOMY AND PYLOROPLASTY     in [place], [place]    No Known Allergies  Outpatient Encounter Medications as of 11/18/2022  Medication Sig   albuterol (VENTOLIN HFA) 108 (90 Base) MCG/ACT inhaler INHALE 2 PUFFS INTO THE LUNGS 4 (FOUR) TIMES DAILY AS NEEDED FOR WHEEZING OR SHORTNESS OF BREATH.   ALPRAZolam (XANAX) 0.25 MG tablet Take 1 tablet (0.25 mg total) by mouth at bedtime as needed for anxiety or sleep.   aspirin EC 81 MG tablet Take 81 mg by mouth daily. Swallow whole.   bisacodyl (DULCOLAX) 10 MG suppository Place 1 suppository (10 mg total) rectally every 12 (twelve) hours as needed for moderate constipation or severe constipation.   budesonide (PULMICORT) 0.5 MG/2ML nebulizer solution TAKE 2 MLS (0.5 MG TOTAL) BY NEBULIZATION 2 (TWO) TIMES DAILY.   docusate sodium (COLACE) 100 MG capsule Take 1 capsule (100 mg total) by mouth 2 (two) times daily.   escitalopram (LEXAPRO) 5 MG tablet Take 20 mg by mouth daily.   ferrous sulfate 325 (65 FE) MG EC tablet Take 325 mg by mouth daily.   fluticasone  (FLONASE) 50 MCG/ACT nasal spray Place 1 spray into both nostrils daily.   ipratropium (ATROVENT) 0.02 % nebulizer solution Take 0.5 mg by nebulization 4 (four) times daily.   memantine (NAMENDA) 10 MG tablet Take 10 mg by mouth 2 (two) times daily.   mirtazapine (REMERON) 7.5 MG tablet Take 7.5 mg by mouth at bedtime.   Multiple Vitamins-Minerals (MULTIVITAMIN WITH MINERALS) tablet Take 1 tablet by mouth daily.  pantoprazole (PROTONIX) 40 MG tablet TAKE 1 TABLET BY MOUTH EVERY DAY   polyethylene glycol (MIRALAX / GLYCOLAX) 17 g packet Take 17 g by mouth daily.   promethazine (PHENERGAN) 25 MG tablet Take 25 mg by mouth every 6 (six) hours as needed for nausea or vomiting.   traMADol-acetaminophen (ULTRACET) 37.5-325 MG tablet Take 1 tablet by mouth 3 (three) times daily.   traZODone (DESYREL) 150 MG tablet Take 150 mg by mouth at bedtime. Give 0.5 tablet   No facility-administered encounter medications on file as of 11/18/2022.    Review of Systems  Constitutional:  Positive for fatigue. Negative for appetite change and fever.  HENT:  Positive for hearing loss. Negative for congestion and trouble swallowing.   Eyes:  Negative for visual disturbance.  Respiratory:  Positive for cough, shortness of breath and wheezing.   Cardiovascular:  Negative for leg swelling.  Gastrointestinal:  Negative for abdominal pain and constipation.  Genitourinary:  Negative for dysuria and urgency.  Musculoskeletal:  Positive for arthralgias, back pain and gait problem.  Skin:  Positive for wound. Negative for color change.       The R great toe nail yellow thick, trauma, base with dry blood.    Neurological:  Negative for speech difficulty, weakness and headaches.  Psychiatric/Behavioral:  Positive for confusion. Negative for behavioral problems and sleep disturbance. The patient is not nervous/anxious.     Immunization History  Administered Date(s) Administered   Fluad Quad(high Dose 65+) 03/18/2020,  05/15/2022   Influenza Split 02/20/2018   Influenza Whole 01/29/2010   Influenza, High Dose Seasonal PF 04/13/2014, 02/12/2016, 03/10/2017, 03/19/2018, 04/11/2019, 05/15/2022   Influenza-Unspecified 03/16/2014, 03/05/2021   Moderna Sars-Covid-2 Vaccination 04/17/2022   PFIZER(Purple Top)SARS-COV-2 Vaccination 08/08/2019, 08/29/2019, 03/18/2020, 02/26/2021   Pneumococcal Conjugate-13 05/12/2016   Pneumococcal Polysaccharide-23 08/08/1996   Tdap 04/10/2003, 02/12/2022   Zoster Recombinat (Shingrix) 02/22/2019   Pertinent  Health Maintenance Due  Topic Date Due   INFLUENZA VACCINE  01/15/2023   DEXA SCAN  Completed      05/10/2022   12:52 AM 05/12/2022    3:50 PM 05/23/2022    2:38 PM 08/22/2022   10:08 AM 11/18/2022   10:51 AM  Fall Risk  Falls in the past year?  1 1 0 1  Was there an injury with Fall?  1 1 0 1  Fall Risk Category Calculator  3 3 0 3  Fall Risk Category (Retired)  [employer]    (RETIRED) Patient Fall Risk Level High fall risk High fall risk High fall risk    Patient at Risk for Falls Due to  History of fall(s);Impaired balance/gait Impaired balance/gait;History of fall(s) History of fall(s);Impaired balance/gait;Impaired mobility History of fall(s);Impaired balance/gait;Impaired mobility  Fall risk Follow up  Falls evaluation completed Falls evaluation completed Falls evaluation completed Falls evaluation completed   Functional Status Survey:    Vitals:   11/18/22 1050  BP: 110/64  Pulse: 74  Resp: 18  Temp: 98 F (36.7 C)  SpO2: 96%  Weight: 88 lb 13.6 oz (40.3 kg)  Height: 4\' 10"  (1.473 m)   Body mass index is 18.57 kg/m. Physical Exam Vitals and nursing note reviewed.  Constitutional:      Comments: frail  HENT:     Head: Normocephalic and atraumatic.     Nose: Nose normal.     Mouth/Throat:     Mouth: Mucous membranes are moist.  Eyes:     Extraocular Movements: Extraocular movements intact.     Conjunctiva/sclera:  Conjunctivae normal.      Pupils: Pupils are equal, round, and reactive to light.  Cardiovascular:     Rate and Rhythm: Normal rate and regular rhythm.     Heart sounds: No murmur heard. Pulmonary:     Breath sounds: No rales.     Comments: O2 via [place], diffused expiratory wheezes R+L lungs.  Abdominal:     Palpations: Abdomen is soft.     Tenderness: There is no abdominal tenderness.  Musculoskeletal:     Cervical back: Normal range of motion and neck supple.     Right lower leg: No edema.     Left lower leg: No edema.     Comments: Left wrist splint.   Skin:    General: Skin is warm and dry.     Comments: Right great toe nail yellow thick, trauma at the base with dry blood seen, no s/s of infection.    Neurological:     General: No focal deficit present.     Mental Status: She is alert.     Motor: No weakness.     Coordination: Coordination normal.     Gait: Gait abnormal.     Comments: Oriented to person, her room on unit.   Psychiatric:        Mood and Affect: Mood normal.     Labs reviewed: Recent Labs    01/29/22 2306 01/30/22 1610 01/31/22 2244 02/01/22 0237 02/02/22 0801 04/10/22 1410  NA 140   < > 137  --  136 142  K 2.9*   < > 4.1  --  3.9 4.1  CL 100   < > 102  --  105 103  CO2 31   < > 28  --  22 26  GLUCOSE 128*   < > 80  --  80 95  BUN 17   < > 19  --  19 18  CREATININE 1.11*   < > 1.30*  --  1.10* 0.92  CALCIUM 9.1   < > 8.9  --  8.6* 9.6  MG 1.7  --   --  2.1  --   --   PHOS 2.4*  --   --  4.1  --   --    < > = values in this interval not displayed.   Recent Labs    11/28/21 1106 01/08/22 1521 04/10/22 1410  AST 22 26 24   ALT 10 14 13   ALKPHOS  --  49 76  BILITOT 0.3 0.3 <0.2  PROT 6.5 5.7* 6.1  ALBUMIN  --  3.0* 3.8   Recent Labs    01/08/22 1521 01/29/22 2306 01/30/22 1610 02/01/22 0237 02/02/22 0801 04/10/22 1410  WBC 7.3 10.3   < > 7.0 6.9 6.3  NEUTROABS 5.1 8.6*  --  4.2  --   --   HGB 9.7* 10.9*   < > 10.3* 10.0* 10.2*  HCT 30.6* 34.4*   < > 32.1*  32.4* 31.4*  MCV 93.6 92.2   < > 92.8 94.2 91  PLT 171 169   < > 180 187 231   < > = values in this interval not displayed.   Lab Results  Component Value Date   TSH 2.88 11/28/2021   Lab Results  Component Value Date   HGBA1C 5.2 07/09/2017   Lab Results  Component Value Date   CHOL 206 (H) 11/28/2021   HDL 68 11/28/2021   LDLCALC 115 (H) 11/28/2021   TRIG 124 11/28/2021  CHOLHDL 3.0 11/28/2021    Significant Diagnostic Results in last 30 days:  No results found.  Assessment/Plan Onychomycosis of right great toe The R great toe nail yellow thick toe nail, trauma at the base, dry blood noted, no s/s of infection, apply protective dressing.   Blepharitis of both eyes Crusted eyelashes, not injected, warm compress 20 min am and pm, cleanse/scrub eyelashes with 1/2 warm water and 1/2 baby shampoo am and pm.   Adult failure to thrive  under hospice care. Bun/creat 19/0.76 08/29/22  GE reflux takes Pantoprazole, prn Phenergan, Hgb 10.2 04/10/22  Slow transit constipation Stable, takes MiraLax, Colace, Prn Dulcolax suppository,   Senile dementia (HCC)  takes ASA, off Donepezil, on Namenda, CT head 05/10/22 Left frontal scalp hematoma. Chronic atrophic and ischemic changes without acute abnormality.   Osteoarthritis, multiple sites Hx of compression fx, managed with Tramadol, Tylenol, Lidocaine patch,  off Gabapentin  COPD, severe (HCC) hypoxia, chronic respiratory failure, followed by Pulmonology, takes Budesonide inh, DuoNeb,  O2, Flonase  Anxiety  takes Lexapro, Trazodone, Mirtazapine, prn Alprazolam.      Family/ staff Communication: plan of care reviewed with the patient and charge nurse.   Labs/tests ordered:  none  Time spend 25 minutes.

## 2022-11-18 NOTE — Assessment & Plan Note (Signed)
Stable,  takes MiraLax, Colace, Prn Dulcolax suppository,  

## 2022-11-18 NOTE — Assessment & Plan Note (Signed)
takes Fe, Hgb 10.2 04/10/22 

## 2022-11-18 NOTE — Assessment & Plan Note (Signed)
under hospice care. Bun/creat 19/0.76 08/29/22 

## 2022-11-18 NOTE — Assessment & Plan Note (Signed)
takes Pantoprazole, prn Phenergan, Hgb 10.2 04/10/22 

## 2022-11-18 NOTE — Assessment & Plan Note (Signed)
Hx of compression fx, managed with Tramadol, Tylenol, Lidocaine patch,  off Gabapentin 

## 2022-11-18 NOTE — Assessment & Plan Note (Signed)
takes Lexapro, Trazodone, Mirtazapine, prn Alprazolam.  

## 2022-11-18 NOTE — Assessment & Plan Note (Signed)
takes ASA, off Donepezil, on Namenda, CT head 05/10/22 Left frontal scalp hematoma. Chronic atrophic and ischemic changes without acute abnormality.  

## 2022-11-18 NOTE — Assessment & Plan Note (Signed)
hypoxia, chronic respiratory failure, followed by Pulmonology, takes Budesonide inh, DuoNeb,  O2, Flonase 

## 2022-11-18 NOTE — Assessment & Plan Note (Signed)
Crusted eyelashes, not injected, warm compress 20 min am and pm, cleanse/scrub eyelashes with 1/2 warm water and 1/2 baby shampoo am and pm.

## 2022-11-18 NOTE — Assessment & Plan Note (Signed)
The R great toe nail yellow thick toe nail, trauma at the base, dry blood noted, no s/s of infection, apply protective dressing.

## 2022-12-15 ENCOUNTER — Encounter: Payer: Self-pay | Admitting: Nurse Practitioner

## 2022-12-15 ENCOUNTER — Non-Acute Institutional Stay (SKILLED_NURSING_FACILITY): Payer: Medicare PPO | Admitting: Nurse Practitioner

## 2022-12-15 DIAGNOSIS — R627 Adult failure to thrive: Secondary | ICD-10-CM | POA: Diagnosis not present

## 2022-12-15 DIAGNOSIS — M4316 Spondylolisthesis, lumbar region: Secondary | ICD-10-CM

## 2022-12-15 DIAGNOSIS — K219 Gastro-esophageal reflux disease without esophagitis: Secondary | ICD-10-CM

## 2022-12-15 DIAGNOSIS — F0390 Unspecified dementia without behavioral disturbance: Secondary | ICD-10-CM | POA: Diagnosis not present

## 2022-12-15 DIAGNOSIS — K5901 Slow transit constipation: Secondary | ICD-10-CM

## 2022-12-15 DIAGNOSIS — J449 Chronic obstructive pulmonary disease, unspecified: Secondary | ICD-10-CM

## 2022-12-15 DIAGNOSIS — F419 Anxiety disorder, unspecified: Secondary | ICD-10-CM

## 2022-12-15 DIAGNOSIS — D508 Other iron deficiency anemias: Secondary | ICD-10-CM

## 2022-12-15 NOTE — Progress Notes (Unsigned)
Location:  [facility] Room Number: NO/63/A Place of Service:  SNF (31) Provider:: Lidwina Kaner X, NP  Patient Care Team: [name], MD as PCP - Cardiology (Cardiology) [name], MD as Consulting Physician (Pulmonary Disease)  Extended Emergency Contact Information Primary Emergency Contact: Olver,[name] Address: [address]          [place], [place] [address] [name] Home Phone: [phone] Mobile Phone: [phone] Relation: Spouse Secondary Emergency Contact: [name] Mobile Phone: [phone] Relation: Granddaughter  Code Status:  DNR Goals of care: Advanced Directive information    12/15/2022    9:29 AM  Advanced Directives  Does Patient Have a Medical Advance Directive? Yes  Type of Estate agent of [place];Living will;Out of facility DNR (pink MOST or yellow form)  Does patient want to make changes to medical advance directive? No - Patient declined  Copy of Healthcare Power of Attorney in Chart? Yes - validated most recent copy scanned in chart (See row information)  Pre-existing out of facility DNR order (yellow form or pink MOST form) Pink MOST/Yellow Form most recent copy in chart - Physician notified to receive inpatient order     Chief Complaint  Patient presents with   Medical Management of Chronic Issues    Patient is here for a follow up for chronic conditions     HPI:  Pt is a 89 y.o. female seen today for medical management of chronic diseases.     Past Medical History:  Diagnosis Date   Allergy    Anemia    Arthritis    Asthma    patient denies   B12 deficiency    COPD (chronic obstructive pulmonary disease) (HCC)    Depression    not recent   Diverticulosis    Esophageal stricture    Hepatic cyst 01/30/2010   Hiatal hernia 1985   patient unaware   Hx of adenomatous colonic polyps 2006   Hyperlipidemia    IBS (irritable bowel syndrome)    PONV  (postoperative nausea and vomiting)    no nausea or vomitting after surgery 07/2014   Positive PPD    PUD (peptic ulcer disease)    S/P TAVR (transcatheter aortic valve replacement) 08/13/2021   s/p TAVR with a 23mm Edwards S3UR via the right carotid approach by Dr. Clifton James & Dr. Laneta Simmers   Severe aortic stenosis    Past Surgical History:  Procedure Laterality Date   ABDOMINAL HYSTERECTOMY     abnormal bleeding   APPENDECTOMY     BARTHOLIN GLAND CYST EXCISION     x2   BREAST BIOPSY Bilateral    Milk glnd   CATARACT EXTRACTION W/ INTRAOCULAR LENS  IMPLANT, BILATERAL Bilateral    COLONOSCOPY     COLONOSCOPY W/ POLYPECTOMY     HARDWARE REMOVAL N/A 12/13/2015   Procedure: Removal of HARDWARE  Lumbar Five-Sacral One ;  Surgeon: Donalee Citrin, MD;  Location: [facility];  Service: Neurosurgery;  Laterality: N/A;   IR KYPHO LUMBAR INC FX REDUCE BONE BX UNI/BIL CANNULATION INC/IMAGING  07/13/2019   PERICARDIAL WINDOW  08/13/2021   Procedure: PERICARDIAL WINDOW;  Surgeon: [name], MD;  Location: [facility] OR;  Service: Open Heart Surgery;;   RIGHT/LEFT HEART CATH AND CORONARY ANGIOGRAPHY N/A 07/11/2021   Procedure: RIGHT/LEFT HEART CATH AND CORONARY ANGIOGRAPHY;  Surgeon: [name], MD;  [facility];  Service: Cardiovascular;  Laterality: N/A;   TEE WITHOUT CARDIOVERSION N/A 08/13/2021  Procedure: TRANSESOPHAGEAL ECHOCARDIOGRAM (TEE);  Surgeon: [name], MD;  Location: [facility] OR;  Service: Open Heart Surgery;  Laterality: N/A;   TRANSCATHETER AORTIC VALVE REPLACEMENT, CAROTID Right 08/13/2021   Procedure: Transcatheter Aortic Valve Replacement-Carotid;  Surgeon: [name], MD;  Location: [facility] OR;  Service: Open Heart Surgery;  Laterality: Right;   UPPER GI ENDOSCOPY     VAGOTOMY AND PYLOROPLASTY     in [place], [place]    No Known Allergies  Outpatient Encounter Medications as of 12/15/2022  Medication Sig   albuterol (VENTOLIN HFA)  108 (90 Base) MCG/ACT inhaler INHALE 2 PUFFS INTO THE LUNGS 4 (FOUR) TIMES DAILY AS NEEDED FOR WHEEZING OR SHORTNESS OF BREATH.   ALPRAZolam (XANAX) 0.25 MG tablet Take 1 tablet (0.25 mg total) by mouth at bedtime as needed for anxiety or sleep.   aspirin EC 81 MG tablet Take 81 mg by mouth daily. Swallow whole.   bisacodyl (DULCOLAX) 10 MG suppository Place 1 suppository (10 mg total) rectally every 12 (twelve) hours as needed for moderate constipation or severe constipation.   budesonide (PULMICORT) 0.5 MG/2ML nebulizer solution TAKE 2 MLS (0.5 MG TOTAL) BY NEBULIZATION 2 (TWO) TIMES DAILY.   docusate sodium (COLACE) 100 MG capsule Take 1 capsule (100 mg total) by mouth 2 (two) times daily.   escitalopram (LEXAPRO) 5 MG tablet Take 20 mg by mouth daily.   ferrous sulfate 325 (65 FE) MG EC tablet Take 325 mg by mouth daily.   fluticasone (FLONASE) 50 MCG/ACT nasal spray Place 1 spray into both nostrils daily.   ipratropium (ATROVENT) 0.02 % nebulizer solution Take 0.5 mg by nebulization 4 (four) times daily.   memantine (NAMENDA) 10 MG tablet Take 10 mg by mouth 2 (two) times daily.   mirtazapine (REMERON) 7.5 MG tablet Take 7.5 mg by mouth at bedtime.   Multiple Vitamins-Minerals (MULTIVITAMIN WITH MINERALS) tablet Take 1 tablet by mouth daily.   pantoprazole (PROTONIX) 40 MG tablet TAKE 1 TABLET BY MOUTH EVERY DAY   polyethylene glycol (MIRALAX / GLYCOLAX) 17 g packet Take 17 g by mouth daily.   promethazine (PHENERGAN) 25 MG tablet Take 25 mg by mouth every 6 (six) hours as needed for nausea or vomiting.   traMADol-acetaminophen (ULTRACET) 37.5-325 MG tablet Take 1 tablet by mouth 3 (three) times daily.   traZODone (DESYREL) 150 MG tablet Take 150 mg by mouth at bedtime. Give 0.5 tablet   No facility-administered encounter medications on file as of 12/15/2022.    Review of Systems  Immunization History  Administered Date(s) Administered   Fluad Quad(high Dose 65+) 03/18/2020, 05/15/2022    Influenza Split 02/20/2018   Influenza Whole 01/29/2010   Influenza, High Dose Seasonal PF 04/13/2014, 02/12/2016, 03/10/2017, 03/19/2018, 04/11/2019, 05/15/2022   Influenza-Unspecified 03/16/2014, 03/05/2021   Moderna Sars-Covid-2 Vaccination 04/17/2022   PFIZER(Purple Top)SARS-COV-2 Vaccination 08/08/2019, 08/29/2019, 03/18/2020, 02/26/2021   Pneumococcal Conjugate-13 05/12/2016   Pneumococcal Polysaccharide-23 08/08/1996   Tdap 04/10/2003, 02/12/2022   Zoster Recombinant(Shingrix) 02/22/2019   Pertinent  Health Maintenance Due  Topic Date Due   INFLUENZA VACCINE  01/15/2023   DEXA SCAN  Completed      05/12/2022    3:50 PM 05/23/2022    2:38 PM 08/22/2022   10:08 AM 11/18/2022   10:51 AM 12/15/2022    9:29 AM  Fall Risk  Falls in the past year? 1 1 0 1 1  Was there an injury with Fall? 1 1 0 1 1  Fall Risk Category Calculator 3 3 0  3 3  Fall Risk Category (Retired) [employer]     (RETIRED) Patient Fall Risk Level High fall risk High fall risk     Patient at Risk for Falls Due to History of fall(s);Impaired balance/gait Impaired balance/gait;History of fall(s) History of fall(s);Impaired balance/gait;Impaired mobility History of fall(s);Impaired balance/gait;Impaired mobility History of fall(s);Impaired balance/gait;Impaired mobility;Impaired vision  Fall risk Follow up Falls evaluation completed Falls evaluation completed Falls evaluation completed Falls evaluation completed Falls evaluation completed   Functional Status Survey:    Vitals:   12/15/22 0930  BP: (!) 108/44  Pulse: 83  Resp: 16  Temp: (!) 97 F (36.1 C)  SpO2: 96%  Weight: 88 lb 8 oz (40.1 kg)  Height: 4\' 10"  (1.473 m)   Body mass index is 18.5 kg/m. Physical Exam  Labs reviewed: Recent Labs    01/29/22 2306 01/30/22 1610 01/31/22 2244 02/01/22 0237 02/02/22 0801 04/10/22 1410  NA 140   < > 137  --  136 142  K 2.9*   < > 4.1  --  3.9 4.1  CL 100   < > 102  --  105 103  CO2 31   < > 28  --  22  26  GLUCOSE 128*   < > 80  --  80 95  BUN 17   < > 19  --  19 18  CREATININE 1.11*   < > 1.30*  --  1.10* 0.92  CALCIUM 9.1   < > 8.9  --  8.6* 9.6  MG 1.7  --   --  2.1  --   --   PHOS 2.4*  --   --  4.1  --   --    < > = values in this interval not displayed.   Recent Labs    01/08/22 1521 04/10/22 1410  AST 26 24  ALT 14 13  ALKPHOS 49 76  BILITOT 0.3 <0.2  PROT 5.7* 6.1  ALBUMIN 3.0* 3.8   Recent Labs    01/08/22 1521 01/29/22 2306 01/30/22 1610 02/01/22 0237 02/02/22 0801 04/10/22 1410  WBC 7.3 10.3   < > 7.0 6.9 6.3  NEUTROABS 5.1 8.6*  --  4.2  --   --   HGB 9.7* 10.9*   < > 10.3* 10.0* 10.2*  HCT 30.6* 34.4*   < > 32.1* 32.4* 31.4*  MCV 93.6 92.2   < > 92.8 94.2 91  PLT 171 169   < > 180 187 231   < > = values in this interval not displayed.   Lab Results  Component Value Date   TSH 2.88 11/28/2021   Lab Results  Component Value Date   HGBA1C 5.2 07/09/2017   Lab Results  Component Value Date   CHOL 206 (H) 11/28/2021   HDL 68 11/28/2021   LDLCALC 115 (H) 11/28/2021   TRIG 124 11/28/2021   CHOLHDL 3.0 11/28/2021    Significant Diagnostic Results in last 30 days:  No results found.  Assessment/Plan There are no diagnoses linked to this encounter.   Family/ staff Communication: ***  Labs/tests ordered:  ***

## 2022-12-16 ENCOUNTER — Encounter: Payer: Self-pay | Admitting: Nurse Practitioner

## 2022-12-16 NOTE — Assessment & Plan Note (Signed)
Stable, takes MiraLax, Colace, Prn Dulcolax suppository

## 2022-12-16 NOTE — Assessment & Plan Note (Signed)
Stable, takes Pantoprazole, prn Phenergan, Hgb 10.2 04/10/22 

## 2022-12-16 NOTE — Assessment & Plan Note (Signed)
Stable, takes Lexapro, Trazodone, Mirtazapine, prn Alprazolam. Na 138 08/29/22

## 2022-12-16 NOTE — Assessment & Plan Note (Signed)
Hx of compression fx, managed with Tramadol, Tylenol, Lidocaine patch,  off Gabapentin 

## 2022-12-16 NOTE — Assessment & Plan Note (Signed)
takes ASA, off Donepezil, on Namenda, CT head 05/10/22 Left frontal scalp hematoma. Chronic atrophic and ischemic changes without acute abnormality.  

## 2022-12-16 NOTE — Assessment & Plan Note (Signed)
takes Fe, Hgb 10.2 04/10/22 

## 2022-12-16 NOTE — Assessment & Plan Note (Signed)
under hospice care. Bun/creat 19/0.76 08/29/22 

## 2022-12-16 NOTE — Assessment & Plan Note (Signed)
hypoxia, chronic respiratory failure, followed by Pulmonology, takes Budesonide inh, DuoNeb,  O2, Flonase 

## 2022-12-25 ENCOUNTER — Other Ambulatory Visit (HOSPITAL_COMMUNITY): Payer: Medicare PPO

## 2022-12-26 ENCOUNTER — Ambulatory Visit: Payer: Medicare PPO | Admitting: Cardiovascular Disease

## 2022-12-26 ENCOUNTER — Other Ambulatory Visit (HOSPITAL_COMMUNITY): Payer: Medicare PPO

## 2022-12-29 ENCOUNTER — Other Ambulatory Visit: Payer: Self-pay | Admitting: Orthopedic Surgery

## 2022-12-29 DIAGNOSIS — M150 Primary generalized (osteo)arthritis: Secondary | ICD-10-CM

## 2022-12-29 DIAGNOSIS — M159 Polyosteoarthritis, unspecified: Secondary | ICD-10-CM

## 2022-12-29 DIAGNOSIS — F419 Anxiety disorder, unspecified: Secondary | ICD-10-CM

## 2022-12-29 MED ORDER — ALPRAZOLAM 0.25 MG PO TABS: 0.2500 mg | ORAL_TABLET | Freq: Every day | ORAL | 0 refills | Status: DC

## 2022-12-29 MED ORDER — TRAMADOL-ACETAMINOPHEN 37.5-325 MG PO TABS: 1.0000 | ORAL_TABLET | Freq: Two times a day (BID) | ORAL | 0 refills | Status: DC

## 2023-01-02 ENCOUNTER — Encounter: Payer: Self-pay | Admitting: Nurse Practitioner

## 2023-01-02 ENCOUNTER — Non-Acute Institutional Stay (SKILLED_NURSING_FACILITY): Payer: Medicare PPO | Admitting: Nurse Practitioner

## 2023-01-02 DIAGNOSIS — K219 Gastro-esophageal reflux disease without esophagitis: Secondary | ICD-10-CM | POA: Diagnosis not present

## 2023-01-02 DIAGNOSIS — F419 Anxiety disorder, unspecified: Secondary | ICD-10-CM

## 2023-01-02 DIAGNOSIS — J449 Chronic obstructive pulmonary disease, unspecified: Secondary | ICD-10-CM

## 2023-01-02 DIAGNOSIS — R627 Adult failure to thrive: Secondary | ICD-10-CM

## 2023-01-02 DIAGNOSIS — K121 Other forms of stomatitis: Secondary | ICD-10-CM

## 2023-01-02 DIAGNOSIS — K5901 Slow transit constipation: Secondary | ICD-10-CM

## 2023-01-02 DIAGNOSIS — K1230 Oral mucositis (ulcerative), unspecified: Secondary | ICD-10-CM

## 2023-01-02 DIAGNOSIS — F0390 Unspecified dementia without behavioral disturbance: Secondary | ICD-10-CM

## 2023-01-02 DIAGNOSIS — M150 Primary generalized (osteo)arthritis: Secondary | ICD-10-CM

## 2023-01-02 DIAGNOSIS — M159 Polyosteoarthritis, unspecified: Secondary | ICD-10-CM

## 2023-01-02 DIAGNOSIS — D508 Other iron deficiency anemias: Secondary | ICD-10-CM

## 2023-01-02 NOTE — Assessment & Plan Note (Signed)
hypoxia, chronic respiratory failure, followed by Pulmonology, takes Budesonide inh, DuoNeb,  O2, Flonase 

## 2023-01-02 NOTE — Assessment & Plan Note (Signed)
c/o sore mouth and gums, noted some redness, no open wounds or white/yellow coating. Will try Magic mouth wash 5ml s/s ac and hs x 2 weeks.

## 2023-01-02 NOTE — Assessment & Plan Note (Signed)
takes Lexapro, Trazodone, Mirtazapine, prn Alprazolam. Na 138 08/29/22

## 2023-01-02 NOTE — Assessment & Plan Note (Signed)
Stable,  takes MiraLax, Colace, Prn Dulcolax suppository,  

## 2023-01-02 NOTE — Assessment & Plan Note (Signed)
under hospice care. Bun/creat 19/0.76 08/29/22

## 2023-01-02 NOTE — Assessment & Plan Note (Signed)
Chronic back pain, Hx of compression fx, managed with Tramadol, Tylenol, Lidocaine patch,  off Gabapentin 

## 2023-01-02 NOTE — Assessment & Plan Note (Signed)
takes Pantoprazole, prn Phenergan, Hgb 10.2 04/10/22 

## 2023-01-02 NOTE — Progress Notes (Signed)
Location:   SNF [facility] Room Number: 32 Place of Service:  SNF (31) Provider: Chipper Oman NP  No primary care provider on file.  Patient Care Team: [name], MD as PCP - Cardiology (Cardiology) [name], MD as Consulting Physician (Pulmonary Disease)  Extended Emergency Contact Information Primary Emergency Contact: Menzie,[name] Address: [address]          [place], [place] [address] [name] Home Phone: [phone] Mobile Phone: [phone] Relation: Spouse Secondary Emergency Contact: [name] Mobile Phone: [phone] Relation: Granddaughter  Code Status: DNR Goals of care: Advanced Directive information    12/15/2022    9:29 AM  Advanced Directives  Does Patient Have a Medical Advance Directive? Yes  Type of Estate agent of [place];Living will;Out of facility DNR (pink MOST or yellow form)  Does patient want to make changes to medical advance directive? No - Patient declined  Copy of Healthcare Power of Attorney in Chart? Yes - validated most recent copy scanned in chart (See row information)  Pre-existing out of facility DNR order (yellow form or pink MOST form) Pink MOST/Yellow Form most recent copy in chart - Physician notified to receive inpatient order     Chief Complaint  Patient presents with   Acute Visit    Sore mouth and gums    HPI:  Pt is a 89 y.o. female seen today for an acute visit for c/o sore mouth and gums, noted some redness, no open wounds or white/yellow coating.      Adult failure to thrive, under hospice care. Bun/creat 19/0.76 08/29/22             GERD, takes Pantoprazole, prn Phenergan, Hgb 10.2 04/10/22             Constipation, takes MiraLax, Colace, Prn Dulcolax suppository,              Cognitive impairment, takes ASA, off Donepezil, on Namenda, CT head 05/10/22 Left frontal scalp hematoma. Chronic atrophic and ischemic changes without acute abnormality.               Chronic back pain, Hx of compression fx, managed with Tramadol, Tylenol, Lidocaine patch,  off Gabapentin             COPD, hypoxia, chronic respiratory failure, followed by Pulmonology, takes Budesonide inh, DuoNeb,  O2, Flonase             Depression, takes Lexapro, Trazodone, Mirtazapine, prn Alprazolam. Na 138 08/29/22             Gait abnormality, needs assistance with transfer, no longer ambulating.              Anemia, takes Fe, Hgb 10.2 04/10/22  Past Medical History:  Diagnosis Date   Allergy    Anemia    Arthritis    Asthma    patient denies   B12 deficiency    COPD (chronic obstructive pulmonary disease) (HCC)    Depression    not recent   Diverticulosis    Esophageal stricture    Hepatic cyst 01/30/2010   Hiatal hernia 1985   patient unaware   Hx of adenomatous colonic polyps 2006   Hyperlipidemia    IBS (irritable bowel syndrome)    PONV (postoperative nausea and vomiting)    no nausea or vomitting after surgery 07/2014   Positive PPD    PUD (peptic ulcer disease)    S/P TAVR (transcatheter aortic valve replacement) 08/13/2021  s/p TAVR with a 23mm Edwards S3UR via the right carotid approach by Dr. Clifton James & Dr. Laneta Simmers   Severe aortic stenosis    Past Surgical History:  Procedure Laterality Date   ABDOMINAL HYSTERECTOMY     abnormal bleeding   APPENDECTOMY     BARTHOLIN GLAND CYST EXCISION     x2   BREAST BIOPSY Bilateral    Milk glnd   CATARACT EXTRACTION W/ INTRAOCULAR LENS  IMPLANT, BILATERAL Bilateral    COLONOSCOPY     COLONOSCOPY W/ POLYPECTOMY     HARDWARE REMOVAL N/A 12/13/2015   Procedure: Removal of HARDWARE  Lumbar Five-Sacral One ;  Surgeon: Donalee Citrin, MD;  Location: [facility];  Service: Neurosurgery;  Laterality: N/A;   IR KYPHO LUMBAR INC FX REDUCE BONE BX UNI/BIL CANNULATION INC/IMAGING  07/13/2019   PERICARDIAL WINDOW  08/13/2021   Procedure: PERICARDIAL WINDOW;  Surgeon: [name], MD;  Location: [facility] OR;  Service:  Open Heart Surgery;;   RIGHT/LEFT HEART CATH AND CORONARY ANGIOGRAPHY N/A 07/11/2021   Procedure: RIGHT/LEFT HEART CATH AND CORONARY ANGIOGRAPHY;  Surgeon: [name], MD;  Location: [facility] CV [facility];  Service: Cardiovascular;  Laterality: N/A;   TEE WITHOUT CARDIOVERSION N/A 08/13/2021   Procedure: TRANSESOPHAGEAL ECHOCARDIOGRAM (TEE);  Surgeon: [name], MD;  Location: [facility] OR;  Service: Open Heart Surgery;  Laterality: N/A;   TRANSCATHETER AORTIC VALVE REPLACEMENT, CAROTID Right 08/13/2021   Procedure: Transcatheter Aortic Valve Replacement-Carotid;  Surgeon: [name], MD;  Location: [facility] OR;  Service: Open Heart Surgery;  Laterality: Right;   UPPER GI ENDOSCOPY     VAGOTOMY AND PYLOROPLASTY     in [place], [place]    No Known Allergies  Allergies as of 01/02/2023   No Known Allergies      Medication List        Accurate as of January 02, 2023  1:10 PM. If you have any questions, ask your nurse or doctor.          albuterol 108 (90 Base) MCG/ACT inhaler Commonly known as: VENTOLIN HFA INHALE 2 PUFFS INTO THE LUNGS 4 (FOUR) TIMES DAILY AS NEEDED FOR WHEEZING OR SHORTNESS OF BREATH.   ALPRAZolam 0.25 MG tablet Commonly known as: XANAX Take 1 tablet (0.25 mg total) by mouth at bedtime.   aspirin EC 81 MG tablet Take 81 mg by mouth daily. Swallow whole.   bisacodyl 10 MG suppository Commonly known as: DULCOLAX Place 1 suppository (10 mg total) rectally every 12 (twelve) hours as needed for moderate constipation or severe constipation.   budesonide 0.5 MG/2ML nebulizer solution Commonly known as: PULMICORT TAKE 2 MLS (0.5 MG TOTAL) BY NEBULIZATION 2 (TWO) TIMES DAILY.   docusate sodium 100 MG capsule Commonly known as: COLACE Take 1 capsule (100 mg total) by mouth 2 (two) times daily.   escitalopram 5 MG tablet Commonly known as: LEXAPRO Take 20 mg by mouth daily.   ferrous sulfate 325 (65 FE) MG EC tablet Take 325 mg by mouth  daily.   fluticasone 50 MCG/ACT nasal spray Commonly known as: FLONASE Place 1 spray into both nostrils daily.   ipratropium 0.02 % nebulizer solution Commonly known as: ATROVENT Take 0.5 mg by nebulization 4 (four) times daily.   memantine 10 MG tablet Commonly known as: NAMENDA Take 10 mg by mouth 2 (two) times daily.   mirtazapine 7.5 MG tablet Commonly known as: REMERON Take 7.5 mg by mouth at bedtime.   multivitamin with minerals tablet Take  1 tablet by mouth daily.   pantoprazole 40 MG tablet Commonly known as: PROTONIX TAKE 1 TABLET BY MOUTH EVERY DAY   polyethylene glycol 17 g packet Commonly known as: MIRALAX / GLYCOLAX Take 17 g by mouth daily.   promethazine 25 MG tablet Commonly known as: PHENERGAN Take 25 mg by mouth every 6 (six) hours as needed for nausea or vomiting.   traMADol-acetaminophen 37.5-325 MG tablet Commonly known as: ULTRACET Take 1 tablet by mouth 2 (two) times daily.   traZODone 150 MG tablet Commonly known as: DESYREL Take 150 mg by mouth at bedtime. Give 0.5 tablet        Review of Systems  Constitutional:  Positive for fatigue. Negative for appetite change and fever.  HENT:  Positive for hearing loss and mouth sores. Negative for congestion, sinus pain, sore throat and trouble swallowing.   Eyes:  Negative for visual disturbance.  Respiratory:  Positive for cough and shortness of breath. Negative for wheezing.   Cardiovascular:  Negative for leg swelling.  Gastrointestinal:  Negative for abdominal pain and constipation.  Genitourinary:  Negative for dysuria and urgency.  Musculoskeletal:  Positive for arthralgias, back pain and gait problem.  Skin:  Negative for color change and wound.       The R great toe nail yellow thick, trauma, healed.    Neurological:  Negative for speech difficulty, weakness and headaches.  Psychiatric/Behavioral:  Positive for confusion. Negative for behavioral problems and sleep disturbance. The  patient is not nervous/anxious.     Immunization History  Administered Date(s) Administered   Fluad Quad(high Dose 65+) 03/18/2020, 05/15/2022   Influenza Split 02/20/2018   Influenza Whole 01/29/2010   Influenza, High Dose Seasonal PF 04/13/2014, 02/12/2016, 03/10/2017, 03/19/2018, 04/11/2019, 05/15/2022   Influenza-Unspecified 03/16/2014, 03/05/2021   Moderna Sars-Covid-2 Vaccination 04/17/2022   PFIZER(Purple Top)SARS-COV-2 Vaccination 08/08/2019, 08/29/2019, 03/18/2020, 02/26/2021   Pneumococcal Conjugate-13 05/12/2016   Pneumococcal Polysaccharide-23 08/08/1996   Tdap 04/10/2003, 02/12/2022   Zoster Recombinant(Shingrix) 02/22/2019   Pertinent  Health Maintenance Due  Topic Date Due   INFLUENZA VACCINE  01/15/2023   DEXA SCAN  Completed      05/12/2022    3:50 PM 05/23/2022    2:38 PM 08/22/2022   10:08 AM 11/18/2022   10:51 AM 12/15/2022    9:29 AM  Fall Risk  Falls in the past year? 1 1 0 1 1  Was there an injury with Fall? 1 1 0 1 1  Fall Risk Category Calculator 3 3 0 3 3  Fall Risk Category (Retired) High High     (RETIRED) Patient Fall Risk Level High fall risk High fall risk     Patient at Risk for Falls Due to History of fall(s);Impaired balance/gait Impaired balance/gait;History of fall(s) History of fall(s);Impaired balance/gait;Impaired mobility History of fall(s);Impaired balance/gait;Impaired mobility History of fall(s);Impaired balance/gait;Impaired mobility;Impaired vision  Fall risk Follow up Falls evaluation completed Falls evaluation completed Falls evaluation completed Falls evaluation completed Falls evaluation completed   Functional Status Survey:    Vitals:   01/02/23 1254  BP: (!) 103/53  Pulse: 96  Resp: 18  Temp: (!) 97.3 F (36.3 C)  SpO2: 98%  Weight: 88 lb 8 oz (40.1 kg)   Body mass index is 18.5 kg/m. Physical Exam Vitals and nursing note reviewed.  Constitutional:      Comments: frail  HENT:     Head: Normocephalic and atraumatic.      Nose: Nose normal.     Mouth/Throat:  Mouth: Mucous membranes are moist.     Pharynx: Posterior oropharyngeal erythema present.     Comments: Entire oral lining and gums mild erythema, no open areas, no white/yellow coating.  Eyes:     Extraocular Movements: Extraocular movements intact.     Conjunctiva/sclera: Conjunctivae normal.     Pupils: Pupils are equal, round, and reactive to light.  Cardiovascular:     Rate and Rhythm: Normal rate and regular rhythm.     Heart sounds: No murmur heard. Pulmonary:     Breath sounds: No wheezing or rales.     Comments: O2 via [place] Abdominal:     Palpations: Abdomen is soft.     Tenderness: There is no abdominal tenderness.  Musculoskeletal:     Cervical back: Normal range of motion and neck supple.     Right lower leg: No edema.     Left lower leg: No edema.     Comments: Left wrist splint.   Skin:    General: Skin is warm and dry.     Comments: Thick yellow toe nails.    Neurological:     General: No focal deficit present.     Mental Status: She is alert.     Motor: No weakness.     Coordination: Coordination normal.     Gait: Gait abnormal.     Comments: Oriented to person, her room on unit.   Psychiatric:        Mood and Affect: Mood normal.     Labs reviewed: Recent Labs    01/29/22 2306 01/30/22 1610 01/31/22 2244 02/01/22 0237 02/02/22 0801 04/10/22 1410  NA 140   < > 137  --  136 142  K 2.9*   < > 4.1  --  3.9 4.1  CL 100   < > 102  --  105 103  CO2 31   < > 28  --  22 26  GLUCOSE 128*   < > 80  --  80 95  BUN 17   < > 19  --  19 18  CREATININE 1.11*   < > 1.30*  --  1.10* 0.92  CALCIUM 9.1   < > 8.9  --  8.6* 9.6  MG 1.7  --   --  2.1  --   --   PHOS 2.4*  --   --  4.1  --   --    < > = values in this interval not displayed.   Recent Labs    01/08/22 1521 04/10/22 1410  AST 26 24  ALT 14 13  ALKPHOS 49 76  BILITOT 0.3 <0.2  PROT 5.7* 6.1  ALBUMIN 3.0* 3.8   Recent Labs    01/08/22 1521  01/29/22 2306 01/30/22 1610 02/01/22 0237 02/02/22 0801 04/10/22 1410  WBC 7.3 10.3   < > 7.0 6.9 6.3  NEUTROABS 5.1 8.6*  --  4.2  --   --   HGB 9.7* 10.9*   < > 10.3* 10.0* 10.2*  HCT 30.6* 34.4*   < > 32.1* 32.4* 31.4*  MCV 93.6 92.2   < > 92.8 94.2 91  PLT 171 169   < > 180 187 231   < > = values in this interval not displayed.   Lab Results  Component Value Date   TSH 2.88 11/28/2021   Lab Results  Component Value Date   HGBA1C 5.2 07/09/2017   Lab Results  Component Value Date   CHOL 206 (H)  11/28/2021   HDL 68 11/28/2021   LDLCALC 115 (H) 11/28/2021   TRIG 124 11/28/2021   CHOLHDL 3.0 11/28/2021    Significant Diagnostic Results in last 30 days:  No results found.  Assessment/Plan: Stomatitis and mucositis  c/o sore mouth and gums, noted some redness, no open wounds or white/yellow coating. Will try Magic mouth wash 5ml s/s ac and hs x 2 weeks.   Adult failure to thrive under hospice care. Bun/creat 19/0.76 08/29/22  GE reflux  takes Pantoprazole, prn Phenergan, Hgb 10.2 04/10/22  Slow transit constipation Stable, takes MiraLax, Colace, Prn Dulcolax suppository,   Senile dementia (HCC) takes ASA, off Donepezil, on Namenda, CT head 05/10/22 Left frontal scalp hematoma. Chronic atrophic and ischemic changes without acute abnormality.   Osteoarthritis, multiple sites  Chronic back pain, Hx of compression fx, managed with Tramadol, Tylenol, Lidocaine patch,  off Gabapentin  COPD, severe (HCC) hypoxia, chronic respiratory failure, followed by Pulmonology, takes Budesonide inh, DuoNeb,  O2, Flonase  Anxiety  takes Lexapro, Trazodone, Mirtazapine, prn Alprazolam. Na 138 08/29/22  Iron deficiency anemia takes Fe, Hgb 10.2 04/10/22    Family/ staff Communication: plan of care reviewed with the patient, the patient's husband,  and charge nurse.   Labs/tests ordered:  none  Time spend 25 minutes.

## 2023-01-02 NOTE — Assessment & Plan Note (Signed)
takes ASA, off Donepezil, on Namenda, CT head 05/10/22 Left frontal scalp hematoma. Chronic atrophic and ischemic changes without acute abnormality.  

## 2023-01-02 NOTE — Assessment & Plan Note (Signed)
takes Fe, Hgb 10.2 04/10/22 

## 2023-01-12 ENCOUNTER — Non-Acute Institutional Stay: Payer: Self-pay | Admitting: Nurse Practitioner

## 2023-01-12 ENCOUNTER — Encounter: Payer: Self-pay | Admitting: Nurse Practitioner

## 2023-01-12 DIAGNOSIS — R627 Adult failure to thrive: Secondary | ICD-10-CM

## 2023-01-12 DIAGNOSIS — D649 Anemia, unspecified: Secondary | ICD-10-CM

## 2023-01-12 DIAGNOSIS — J449 Chronic obstructive pulmonary disease, unspecified: Secondary | ICD-10-CM

## 2023-01-12 DIAGNOSIS — K219 Gastro-esophageal reflux disease without esophagitis: Secondary | ICD-10-CM | POA: Diagnosis not present

## 2023-01-12 DIAGNOSIS — F419 Anxiety disorder, unspecified: Secondary | ICD-10-CM

## 2023-01-12 DIAGNOSIS — K5901 Slow transit constipation: Secondary | ICD-10-CM

## 2023-01-12 DIAGNOSIS — F0390 Unspecified dementia without behavioral disturbance: Secondary | ICD-10-CM

## 2023-01-12 DIAGNOSIS — K121 Other forms of stomatitis: Secondary | ICD-10-CM

## 2023-01-12 DIAGNOSIS — M159 Polyosteoarthritis, unspecified: Secondary | ICD-10-CM

## 2023-01-12 DIAGNOSIS — K1230 Oral mucositis (ulcerative), unspecified: Secondary | ICD-10-CM

## 2023-01-12 NOTE — Assessment & Plan Note (Signed)
Stable,  takes MiraLax, Colace, Prn Dulcolax suppository,  

## 2023-01-12 NOTE — Assessment & Plan Note (Signed)
takes Fe, Hgb 10.2 04/10/22 

## 2023-01-12 NOTE — Assessment & Plan Note (Signed)
Gradual progression,  takes ASA, off Donepezil, on Namenda, CT head 05/10/22 Left frontal scalp hematoma. Chronic atrophic and ischemic changes without acute abnormality.

## 2023-01-12 NOTE — Assessment & Plan Note (Signed)
Healed, treated with Magic mouth wash.

## 2023-01-12 NOTE — Assessment & Plan Note (Signed)
takes Lexapro, Trazodone, Mirtazapine, prn Alprazolam. Na 138 08/29/22

## 2023-01-12 NOTE — Assessment & Plan Note (Signed)
hypoxia, chronic respiratory failure, followed by Pulmonology, takes Budesonide inh, DuoNeb,  O2, Flonase 

## 2023-01-12 NOTE — Progress Notes (Signed)
Location:   SNF [facility] Room Number: 68 Place of Service:  SNF (31) Provider: Chipper Oman NP  No primary care provider on file.  Patient Care Team: [name], MD as PCP - Cardiology (Cardiology) [name], MD as Consulting Physician (Pulmonary Disease)  Extended Emergency Contact Information Primary Emergency Contact: Grilli,[name] Address: [address]          [place], [place] [address] D[name] Home Phone: [phone] Mobile Phone: [phone] Relation: Spouse Secondary Emergency Contact: [name] Mobile Phone: [phone] Relation: Granddaughter  Code Status:  DNR Goals of care: Advanced Directive information    12/15/2022    9:29 AM  Advanced Directives  Does Patient Have a Medical Advance Directive? Yes  Type of Estate agent of [place];Living will;Out of facility DNR (pink MOST or yellow form)  Does patient want to make changes to medical advance directive? No - Patient declined  Copy of Healthcare Power of Attorney in Chart? Yes - validated most recent copy scanned in chart (See row information)  Pre-existing out of facility DNR order (yellow form or pink MOST form) Pink MOST/Yellow Form most recent copy in chart - Physician notified to receive inpatient order     Chief Complaint  Patient presents with   Medical Management of Chronic Issues    HPI:  Pt is a 89 y.o. female seen today for medical management of chronic diseases.    Stomatitis, resolved, treated with Magic mouth wash.    Adult failure to thrive, same weight as in march, under hospice care. Bun/creat 19/0.76 08/29/22             GERD, takes Pantoprazole, prn Phenergan, Hgb 10.2 04/10/22             Constipation, takes MiraLax, Colace, Prn Dulcolax suppository,              Cognitive impairment, takes ASA, off Donepezil, on Namenda, CT head 05/10/22 Left frontal scalp hematoma. Chronic atrophic and ischemic changes without acute  abnormality.              Chronic back pain, Hx of compression fx, managed with Tramadol, Tylenol, Lidocaine patch,  off Gabapentin             COPD, hypoxia, chronic respiratory failure, followed by Pulmonology, takes Budesonide inh, DuoNeb,  O2, Flonase             Depression, takes Lexapro, Trazodone, Mirtazapine, prn Alprazolam. Na 138 08/29/22             Gait abnormality, needs assistance with transfer, no longer ambulating.              Anemia, takes Fe, Hgb 10.2 04/10/22    Past Medical History:  Diagnosis Date   Allergy    Anemia    Arthritis    Asthma    patient denies   B12 deficiency    COPD (chronic obstructive pulmonary disease) (HCC)    Depression    not recent   Diverticulosis    Esophageal stricture    Hepatic cyst 01/30/2010   Hiatal hernia 1985   patient unaware   Hx of adenomatous colonic polyps 2006   Hyperlipidemia    IBS (irritable bowel syndrome)    PONV (postoperative nausea and vomiting)    no nausea or vomitting after surgery 07/2014   Positive PPD    PUD (peptic ulcer disease)    S/P TAVR (transcatheter aortic valve replacement) 08/13/2021  s/p TAVR with a 23mm Edwards S3UR via the right carotid approach by Dr. Clifton James & Dr. Laneta Simmers   Severe aortic stenosis    Past Surgical History:  Procedure Laterality Date   ABDOMINAL HYSTERECTOMY     abnormal bleeding   APPENDECTOMY     BARTHOLIN GLAND CYST EXCISION     x2   BREAST BIOPSY Bilateral    Milk glnd   CATARACT EXTRACTION W/ INTRAOCULAR LENS  IMPLANT, BILATERAL Bilateral    COLONOSCOPY     COLONOSCOPY W/ POLYPECTOMY     HARDWARE REMOVAL N/A 12/13/2015   Procedure: Removal of HARDWARE  Lumbar Five-Sacral One ;  Surgeon: Donalee Citrin, MD;  Location: [facility];  Service: Neurosurgery;  Laterality: N/A;   IR KYPHO LUMBAR INC FX REDUCE BONE BX UNI/BIL CANNULATION INC/IMAGING  07/13/2019   PERICARDIAL WINDOW  08/13/2021   Procedure: PERICARDIAL WINDOW;  Surgeon: [name], MD;   Location: [facility] OR;  Service: Open Heart Surgery;;   RIGHT/LEFT HEART CATH AND CORONARY ANGIOGRAPHY N/A 07/11/2021   Procedure: RIGHT/LEFT HEART CATH AND CORONARY ANGIOGRAPHY;  Surgeon: [name], MD;  Location: [facility] CV [facility];  Service: Cardiovascular;  Laterality: N/A;   TEE WITHOUT CARDIOVERSION N/A 08/13/2021   Procedure: TRANSESOPHAGEAL ECHOCARDIOGRAM (TEE);  Surgeon: [name], MD;  Location: [facility] OR;  Service: Open Heart Surgery;  Laterality: N/A;   TRANSCATHETER AORTIC VALVE REPLACEMENT, CAROTID Right 08/13/2021   Procedure: Transcatheter Aortic Valve Replacement-Carotid;  Surgeon: [name], MD;  Location: [facility] OR;  Service: Open Heart Surgery;  Laterality: Right;   UPPER GI ENDOSCOPY     VAGOTOMY AND PYLOROPLASTY     in [place]    No Known Allergies  Allergies as of 01/12/2023   No Known Allergies      Medication List        Accurate as of January 12, 2023  4:44 PM. If you have any questions, ask your nurse or doctor.          albuterol 108 (90 Base) MCG/ACT inhaler Commonly known as: VENTOLIN HFA INHALE 2 PUFFS INTO THE LUNGS 4 (FOUR) TIMES DAILY AS NEEDED FOR WHEEZING OR SHORTNESS OF BREATH.   ALPRAZolam 0.25 MG tablet Commonly known as: XANAX Take 1 tablet (0.25 mg total) by mouth at bedtime.   aspirin EC 81 MG tablet Take 81 mg by mouth daily. Swallow whole.   bisacodyl 10 MG suppository Commonly known as: DULCOLAX Place 1 suppository (10 mg total) rectally every 12 (twelve) hours as needed for moderate constipation or severe constipation.   budesonide 0.5 MG/2ML nebulizer solution Commonly known as: PULMICORT TAKE 2 MLS (0.5 MG TOTAL) BY NEBULIZATION 2 (TWO) TIMES DAILY.   docusate sodium 100 MG capsule Commonly known as: COLACE Take 1 capsule (100 mg total) by mouth 2 (two) times daily.   escitalopram 5 MG tablet Commonly known as: LEXAPRO Take 20 mg by mouth daily.   ferrous sulfate 325 (65 FE) MG EC  tablet Take 325 mg by mouth daily.   fluticasone 50 MCG/ACT nasal spray Commonly known as: FLONASE Place 1 spray into both nostrils daily.   ipratropium 0.02 % nebulizer solution Commonly known as: ATROVENT Take 0.5 mg by nebulization 4 (four) times daily.   memantine 10 MG tablet Commonly known as: NAMENDA Take 10 mg by mouth 2 (two) times daily.   mirtazapine 7.5 MG tablet Commonly known as: REMERON Take 7.5 mg by mouth at bedtime.   multivitamin with minerals tablet Take  1 tablet by mouth daily.   pantoprazole 40 MG tablet Commonly known as: PROTONIX TAKE 1 TABLET BY MOUTH EVERY DAY   polyethylene glycol 17 g packet Commonly known as: MIRALAX / GLYCOLAX Take 17 g by mouth daily.   promethazine 25 MG tablet Commonly known as: PHENERGAN Take 25 mg by mouth every 6 (six) hours as needed for nausea or vomiting.   traMADol-acetaminophen 37.5-325 MG tablet Commonly known as: ULTRACET Take 1 tablet by mouth 2 (two) times daily.   traZODone 150 MG tablet Commonly known as: DESYREL Take 150 mg by mouth at bedtime. Give 0.5 tablet        Review of Systems  Constitutional:  Positive for fatigue. Negative for appetite change and fever.  HENT:  Positive for hearing loss. Negative for congestion, mouth sores, sinus pain, sore throat and trouble swallowing.   Eyes:  Negative for visual disturbance.  Respiratory:  Positive for cough and shortness of breath. Negative for wheezing.   Cardiovascular:  Negative for leg swelling.  Gastrointestinal:  Negative for abdominal pain and constipation.  Genitourinary:  Negative for dysuria and urgency.  Musculoskeletal:  Positive for arthralgias, back pain and gait problem.  Skin:  Negative for color change and wound.       The R great toe nail yellow thick, trauma, healed.    Neurological:  Negative for speech difficulty, weakness and headaches.  Psychiatric/Behavioral:  Positive for confusion. Negative for behavioral problems and  sleep disturbance. The patient is not nervous/anxious.     Immunization History  Administered Date(s) Administered   Fluad Quad(high Dose 65+) 03/18/2020, 05/15/2022   Influenza Split 02/20/2018   Influenza Whole 01/29/2010   Influenza, High Dose Seasonal PF 04/13/2014, 02/12/2016, 03/10/2017, 03/19/2018, 04/11/2019, 05/15/2022   Influenza-Unspecified 03/16/2014, 03/05/2021   Moderna Sars-Covid-2 Vaccination 04/17/2022   PFIZER(Purple Top)SARS-COV-2 Vaccination 08/08/2019, 08/29/2019, 03/18/2020, 02/26/2021   Pneumococcal Conjugate-13 05/12/2016   Pneumococcal Polysaccharide-23 08/08/1996   Tdap 04/10/2003, 02/12/2022   Zoster Recombinant(Shingrix) 02/22/2019   Pertinent  Health Maintenance Due  Topic Date Due   INFLUENZA VACCINE  01/15/2023   DEXA SCAN  Completed      05/12/2022    3:50 PM 05/23/2022    2:38 PM 08/22/2022   10:08 AM 11/18/2022   10:51 AM 12/15/2022    9:29 AM  Fall Risk  Falls in the past year? 1 1 0 1 1  Was there an injury with Fall? 1 1 0 1 1  Fall Risk Category Calculator 3 3 0 3 3  Fall Risk Category (Retired) High High     (RETIRED) Patient Fall Risk Level High fall risk High fall risk     Patient at Risk for Falls Due to History of fall(s);Impaired balance/gait Impaired balance/gait;History of fall(s) History of fall(s);Impaired balance/gait;Impaired mobility History of fall(s);Impaired balance/gait;Impaired mobility History of fall(s);Impaired balance/gait;Impaired mobility;Impaired vision  Fall risk Follow up Falls evaluation completed Falls evaluation completed Falls evaluation completed Falls evaluation completed Falls evaluation completed   Functional Status Survey:    Vitals:   01/12/23 1336  BP: 111/62  Pulse: 77  Resp: 18  Temp: (!) 97.3 F (36.3 C)  SpO2: 99%  Weight: 85 lb 8 oz (38.8 kg)   Body mass index is 17.87 kg/m. Physical Exam Vitals and nursing note reviewed.  Constitutional:      Comments: frail  HENT:     Head:  Normocephalic and atraumatic.     Nose: Nose normal.     Mouth/Throat:     Mouth:  Mucous membranes are moist.     Pharynx: Posterior oropharyngeal erythema present.  Eyes:     Extraocular Movements: Extraocular movements intact.     Conjunctiva/sclera: Conjunctivae normal.     Pupils: Pupils are equal, round, and reactive to light.  Cardiovascular:     Rate and Rhythm: Normal rate and regular rhythm.     Heart sounds: No murmur heard. Pulmonary:     Breath sounds: No rales.     Comments: O2 via [place] Abdominal:     Palpations: Abdomen is soft.     Tenderness: There is no abdominal tenderness.  Musculoskeletal:     Cervical back: Normal range of motion and neck supple.     Right lower leg: No edema.     Left lower leg: No edema.     Comments: Left wrist splint.   Skin:    General: Skin is warm and dry.     Comments: Thick yellow toe nails.    Neurological:     General: No focal deficit present.     Mental Status: She is alert.     Motor: No weakness.     Coordination: Coordination normal.     Gait: Gait abnormal.     Comments: Oriented to person, her room on unit.   Psychiatric:        Mood and Affect: Mood normal.     Labs reviewed: Recent Labs    01/29/22 2306 01/30/22 1610 01/31/22 2244 02/01/22 0237 02/02/22 0801 04/10/22 1410  NA 140   < > 137  --  136 142  K 2.9*   < > 4.1  --  3.9 4.1  CL 100   < > 102  --  105 103  CO2 31   < > 28  --  22 26  GLUCOSE 128*   < > 80  --  80 95  BUN 17   < > 19  --  19 18  CREATININE 1.11*   < > 1.30*  --  1.10* 0.92  CALCIUM 9.1   < > 8.9  --  8.6* 9.6  MG 1.7  --   --  2.1  --   --   PHOS 2.4*  --   --  4.1  --   --    < > = values in this interval not displayed.   Recent Labs    04/10/22 1410  AST 24  ALT 13  ALKPHOS 76  BILITOT <0.2  PROT 6.1  ALBUMIN 3.8   Recent Labs    01/29/22 2306 01/30/22 1610 02/01/22 0237 02/02/22 0801 04/10/22 1410  WBC 10.3   < > 7.0 6.9 6.3  NEUTROABS 8.6*  --  4.2  --   --    HGB 10.9*   < > 10.3* 10.0* 10.2*  HCT 34.4*   < > 32.1* 32.4* 31.4*  MCV 92.2   < > 92.8 94.2 91  PLT 169   < > 180 187 231   < > = values in this interval not displayed.   Lab Results  Component Value Date   TSH 2.88 11/28/2021   Lab Results  Component Value Date   HGBA1C 5.2 07/09/2017   Lab Results  Component Value Date   CHOL 206 (H) 11/28/2021   HDL 68 11/28/2021   LDLCALC 115 (H) 11/28/2021   TRIG 124 11/28/2021   CHOLHDL 3.0 11/28/2021    Significant Diagnostic Results in last 30 days:  No results found.  Assessment/Plan  Adult failure to thrive same weight as in march, under hospice care. Bun/creat 19/0.76 08/29/22  Stomatitis and mucositis Healed, treated with Magic mouth wash.   GE reflux Stable,  takes Pantoprazole, prn Phenergan, Hgb 10.2 04/10/22  Slow transit constipation Stable, takes MiraLax, Colace, Prn Dulcolax suppository,   Senile dementia (HCC) Gradual progression,  takes ASA, off Donepezil, on Namenda, CT head 05/10/22 Left frontal scalp hematoma. Chronic atrophic and ischemic changes without acute abnormality.   Osteoarthritis, multiple sites  Hx of compression fx, managed with Tramadol, Tylenol, Lidocaine patch,  off Gabapentin  COPD, severe (HCC)  hypoxia, chronic respiratory failure, followed by Pulmonology, takes Budesonide inh, DuoNeb,  O2, Flonase  Anxiety takes Lexapro, Trazodone, Mirtazapine, prn Alprazolam. Na 138 08/29/22  Normocytic anemia takes Fe, Hgb 10.2 04/10/22   Family/ staff Communication: plan of care reviewed with the patient and charge nurse.   Labs/tests ordered:  none  Time spend 25 minutes.

## 2023-01-12 NOTE — Assessment & Plan Note (Signed)
Stable, takes Pantoprazole, prn Phenergan, Hgb 10.2 04/10/22

## 2023-01-12 NOTE — Assessment & Plan Note (Signed)
Hx of compression fx, managed with Tramadol, Tylenol, Lidocaine patch,  off Gabapentin 

## 2023-01-12 NOTE — Assessment & Plan Note (Signed)
same weight as in march, under hospice care. Bun/creat 19/0.76 08/29/22

## 2023-01-23 ENCOUNTER — Non-Acute Institutional Stay (SKILLED_NURSING_FACILITY): Payer: Medicare PPO | Admitting: Nurse Practitioner

## 2023-01-23 ENCOUNTER — Encounter: Payer: Self-pay | Admitting: Nurse Practitioner

## 2023-01-23 DIAGNOSIS — J449 Chronic obstructive pulmonary disease, unspecified: Secondary | ICD-10-CM

## 2023-01-23 DIAGNOSIS — R627 Adult failure to thrive: Secondary | ICD-10-CM | POA: Diagnosis not present

## 2023-01-23 DIAGNOSIS — K219 Gastro-esophageal reflux disease without esophagitis: Secondary | ICD-10-CM

## 2023-01-23 DIAGNOSIS — K5901 Slow transit constipation: Secondary | ICD-10-CM

## 2023-01-23 DIAGNOSIS — F0390 Unspecified dementia without behavioral disturbance: Secondary | ICD-10-CM | POA: Diagnosis not present

## 2023-01-23 DIAGNOSIS — R1310 Dysphagia, unspecified: Secondary | ICD-10-CM | POA: Insufficient documentation

## 2023-01-23 DIAGNOSIS — M159 Polyosteoarthritis, unspecified: Secondary | ICD-10-CM

## 2023-01-23 DIAGNOSIS — F419 Anxiety disorder, unspecified: Secondary | ICD-10-CM

## 2023-01-23 NOTE — Assessment & Plan Note (Signed)
hypoxia, chronic respiratory failure, followed by Pulmonology, takes Budesonide inh, DuoNeb,  O2, Flonase 

## 2023-01-23 NOTE — Assessment & Plan Note (Signed)
takes ASA, off Donepezil, on Namenda, CT head 05/10/22 Left frontal scalp hematoma. Chronic atrophic and ischemic changes without acute abnormality.  

## 2023-01-23 NOTE — Progress Notes (Unsigned)
Location:  [facility] Room Number: 63-A Place of Service:  SNF (31) Provider:  ,  X, NP    Patient Care Team: [name], MD as PCP - Cardiology (Cardiology) [name], MD as Consulting Physician (Pulmonary Disease)  Extended Emergency Contact Information Primary Emergency Contact: Palomarez,[name] Address: [address]          [place], [place] [address] [name] Home Phone: [phone] Mobile Phone: [phone] Relation: Spouse Secondary Emergency Contact: [name] Mobile Phone: [phone] Relation: Granddaughter  Code Status:  DNR Goals of care: Advanced Directive information    01/23/2023    9:18 AM  Advanced Directives  Does Patient Have a Medical Advance Directive? Yes  Type of Estate agent of [place];Living will;Out of facility DNR (pink MOST or yellow form)  Does patient want to make changes to medical advance directive? No - Patient declined  Copy of Healthcare Power of Attorney in Chart? Yes - validated most recent copy scanned in chart (See row information)  Pre-existing out of facility DNR order (yellow form or pink MOST form) Pink MOST/Yellow Form most recent copy in chart - Physician notified to receive inpatient order     Chief Complaint  Patient presents with  . Acute Visit    hypoxia    HPI:  Pt is a 89 y.o. female seen today for an acute visit for persisted hypoxia, tachypnea at times, anxiousness.                Adult failure to thrive, gradual weight loss, #5Ibs in the past 3 months, already lower body weight of #80s Ibs,  under hospice care. Bun/creat 19/0.76 08/29/22             GERD, takes Pantoprazole, prn Phenergan, Hgb 10.2 04/10/22             Constipation, takes MiraLax, Colace, Prn Dulcolax suppository,              Cognitive impairment, takes ASA, off Donepezil, on Namenda, CT head 05/10/22 Left frontal scalp hematoma. Chronic atrophic and ischemic  changes without acute abnormality.              Chronic back pain, Hx of compression fx, managed with Tramadol, Tylenol, Lidocaine patch,  off Gabapentin             COPD, hypoxia, chronic respiratory failure, followed by Pulmonology, takes Budesonide inh, DuoNeb,  O2, Flonase             Depression, takes Lexapro, Trazodone, Mirtazapine, prn Alprazolam. Na 138 08/29/22             Gait abnormality, needs assistance with transfer, no longer ambulating.              Anemia, takes Fe, Hgb 10.2 04/10/22    Past Medical History:  Diagnosis Date  . Allergy   . Anemia   . Arthritis   . Asthma    patient denies  . B12 deficiency   . COPD (chronic obstructive pulmonary disease) (HCC)   . Depression    not recent  . Diverticulosis   . Esophageal stricture   . Hepatic cyst 01/30/2010  . Hiatal hernia 1985   patient unaware  . Hx of adenomatous colonic polyps 2006  . Hyperlipidemia   . IBS (irritable bowel syndrome)   . PONV (postoperative nausea and vomiting)    no nausea or vomitting after surgery 07/2014  . Positive PPD   .  PUD (peptic ulcer disease)   . S/P TAVR (transcatheter aortic valve replacement) 08/13/2021   s/p TAVR with a 23mm Edwards S3UR via the right carotid approach by Dr. Clifton James & Dr. Laneta Simmers  . Severe aortic stenosis    Past Surgical History:  Procedure Laterality Date  . ABDOMINAL HYSTERECTOMY     abnormal bleeding  . APPENDECTOMY    . BARTHOLIN GLAND CYST EXCISION     x2  . BREAST BIOPSY Bilateral    Milk glnd  . CATARACT EXTRACTION W/ INTRAOCULAR LENS  IMPLANT, BILATERAL Bilateral   . COLONOSCOPY    . COLONOSCOPY W/ POLYPECTOMY    . HARDWARE REMOVAL N/A 12/13/2015   Procedure: Removal of HARDWARE  Lumbar Five-Sacral One ;  Surgeon: Donalee Citrin, MD;  Location: [facility] NEURO ORS;  Service: Neurosurgery;  Laterality: N/A;  . IR KYPHO LUMBAR INC FX REDUCE BONE BX UNI/BIL CANNULATION INC/IMAGING  07/13/2019  . PERICARDIAL WINDOW  08/13/2021   Procedure: PERICARDIAL  WINDOW;  Surgeon: [name], MD;  Location: [facility] OR;  Service: Open Heart Surgery;;  . RIGHT/LEFT HEART CATH AND CORONARY ANGIOGRAPHY N/A 07/11/2021   Procedure: RIGHT/LEFT HEART CATH AND CORONARY ANGIOGRAPHY;  Surgeon: [name], MD;  Location: [facility] INVASIVE CV LAB;  Service: Cardiovascular;  Laterality: N/A;  . TEE WITHOUT CARDIOVERSION N/A 08/13/2021   Procedure: TRANSESOPHAGEAL ECHOCARDIOGRAM (TEE);  Surgeon: [name], MD;  Location[facility] OR;  Service: Open Heart Surgery;  Laterality: N/A;  . TRANSCATHETER AORTIC VALVE REPLACEMENT, CAROTID Right 08/13/2021   Procedure: Transcatheter Aortic Valve Replacement-Carotid;  Surgeon: [name], MD;  Location: [facility] OR;  Service: Open Heart Surgery;  Laterality: Right;  . UPPER GI ENDOSCOPY    . VAGOTOMY AND PYLOROPLASTY     in [place], [place]    No Known Allergies  Outpatient Encounter Medications as of 01/23/2023  Medication Sig  . acetaminophen (TYLENOL) 325 MG tablet Take 650 mg by mouth every 4 (four) hours as needed for fever.  Marland Kitchen acetaminophen (TYLENOL) 325 MG tablet Take 650 mg by mouth every 4 (four) hours as needed for moderate pain.  Marland Kitchen albuterol (VENTOLIN HFA) 108 (90 Base) MCG/ACT inhaler INHALE 2 PUFFS INTO THE LUNGS 4 (FOUR) TIMES DAILY AS NEEDED FOR WHEEZING OR SHORTNESS OF BREATH.  Marland Kitchen ALPRAZolam (XANAX) 0.25 MG tablet Take 1 tablet (0.25 mg total) by mouth at bedtime.  . Artificial Tears ophthalmic solution Place 2 drops into both eyes in the morning and at bedtime.  Marland Kitchen aspirin EC 81 MG tablet Take 81 mg by mouth daily. Swallow whole.  . bisacodyl (DULCOLAX) 10 MG suppository Place 1 suppository (10 mg total) rectally every 12 (twelve) hours as needed for moderate constipation or severe constipation.  . budesonide (PULMICORT) 0.5 MG/2ML nebulizer solution TAKE 2 MLS (0.5 MG TOTAL) BY NEBULIZATION 2 (TWO) TIMES DAILY.  Marland Kitchen docusate sodium (COLACE) 100 MG capsule Take 1 capsule (100 mg total) by  mouth 2 (two) times daily.  Marland Kitchen escitalopram (LEXAPRO) 5 MG tablet Take 20 mg by mouth daily.  . ferrous sulfate 325 (65 FE) MG EC tablet Take 325 mg by mouth daily.  . fluticasone (FLONASE) 50 MCG/ACT nasal spray Place 1 spray into both nostrils daily.  Marland Kitchen ipratropium (ATROVENT) 0.02 % nebulizer solution Take 0.5 mg by nebulization 4 (four) times daily.  . memantine (NAMENDA) 10 MG tablet Take 10 mg by mouth 2 (two) times daily.  . mirtazapine (REMERON) 7.5 MG tablet Take 7.5 mg by mouth at bedtime.  Marland Kitchen  Multiple Vitamins-Minerals (MULTIVITAMIN WITH MINERALS) tablet Take 1 tablet by mouth daily.  . pantoprazole (PROTONIX) 40 MG tablet TAKE 1 TABLET BY MOUTH EVERY DAY  . polyethylene glycol (MIRALAX / GLYCOLAX) 17 g packet Take 17 g by mouth daily.  . promethazine (PHENERGAN) 25 MG tablet Take 25 mg by mouth every 6 (six) hours as needed for nausea or vomiting.  . traMADol-acetaminophen (ULTRACET) 37.5-325 MG tablet Take 1 tablet by mouth 2 (two) times daily.  . traZODone (DESYREL) 150 MG tablet Take 150 mg by mouth at bedtime. Give 0.5 tablet   No facility-administered encounter medications on file as of 01/23/2023.    Review of Systems  Constitutional:  Positive for fatigue. Negative for appetite change and fever.  HENT:  Positive for hearing loss. Negative for congestion, mouth sores, sinus pain, sore throat and trouble swallowing.   Eyes:  Negative for visual disturbance.  Respiratory:  Positive for cough and shortness of breath. Negative for wheezing.   Cardiovascular:  Negative for leg swelling.  Gastrointestinal:  Negative for abdominal pain and constipation.  Genitourinary:  Negative for dysuria and urgency.  Musculoskeletal:  Positive for arthralgias, back pain and gait problem.  Skin:  Negative for color change and wound.       The R great toe nail yellow thick, trauma, healed.    Neurological:  Negative for speech difficulty, weakness and headaches.  Psychiatric/Behavioral:   Positive for confusion. Negative for behavioral problems and sleep disturbance. The patient is not nervous/anxious.     Immunization History  Administered Date(s) Administered  . Fluad Quad(high Dose 65+) 03/18/2020, 05/15/2022  . Influenza Split 02/20/2018  . Influenza Whole 01/29/2010  . Influenza, High Dose Seasonal PF 04/13/2014, 02/12/2016, 03/10/2017, 03/19/2018, 04/11/2019, 05/15/2022  . Influenza-Unspecified 03/16/2014, 03/05/2021  . Moderna Sars-Covid-2 Vaccination 04/17/2022  . PFIZER(Purple Top)SARS-COV-2 Vaccination 08/08/2019, 08/29/2019, 03/18/2020, 02/26/2021  . Pneumococcal Conjugate-13 05/12/2016  . Pneumococcal Polysaccharide-23 08/08/1996  . Tdap 04/10/2003, 02/12/2022  . Zoster Recombinant(Shingrix) 02/22/2019   Pertinent  Health Maintenance Due  Topic Date Due  . INFLUENZA VACCINE  01/15/2023  . DEXA SCAN  Completed      05/12/2022    3:50 PM 05/23/2022    2:38 PM 08/22/2022   10:08 AM 11/18/2022   10:51 AM 12/15/2022    9:29 AM  Fall Risk  Falls in the past year? 1 1 0 1 1  Was there an injury with Fall? 1 1 0 1 1  Fall Risk Category Calculator 3 3 0 3 3  Fall Risk Category (Retired) High High     (RETIRED) Patient Fall Risk Level High fall risk High fall risk     Patient at Risk for Falls Due to History of fall(s);Impaired balance/gait Impaired balance/gait;History of fall(s) History of fall(s);Impaired balance/gait;Impaired mobility History of fall(s);Impaired balance/gait;Impaired mobility History of fall(s);Impaired balance/gait;Impaired mobility;Impaired vision  Fall risk Follow up Falls evaluation completed Falls evaluation completed Falls evaluation completed Falls evaluation completed Falls evaluation completed   Functional Status Survey:    Vitals:   01/23/23 0904  BP: 116/63  Pulse: 98  Resp: 14  Temp: 98 F (36.7 C)  SpO2: 90%  Weight: 83 lb 11.2 oz (38 kg)  Height: 4\' 10"  (1.473 m)   Body mass index is 17.49 kg/m. Physical Exam Vitals  and nursing note reviewed.  Constitutional:      Comments: frail  HENT:     Head: Normocephalic and atraumatic.     Nose: Nose normal.     Mouth/Throat:  Mouth: Mucous membranes are moist.  Eyes:     Extraocular Movements: Extraocular movements intact.     Conjunctiva/sclera: Conjunctivae normal.     Pupils: Pupils are equal, round, and reactive to light.  Cardiovascular:     Rate and Rhythm: Normal rate and regular rhythm.     Heart sounds: No murmur heard. Pulmonary:     Breath sounds: No rales.     Comments: O2 via [place], gets tachypnea with any exertion.  Abdominal:     Palpations: Abdomen is soft.     Tenderness: There is no abdominal tenderness.  Musculoskeletal:     Cervical back: Normal range of motion and neck supple.     Right lower leg: No edema.     Left lower leg: No edema.     Comments: Left wrist splint.   Skin:    General: Skin is warm and dry.     Comments: Thick yellow toe nails.    Neurological:     General: No focal deficit present.     Mental Status: She is alert.     Motor: No weakness.     Coordination: Coordination normal.     Gait: Gait abnormal.     Comments: Oriented to person, her room on unit.   Psychiatric:        Mood and Affect: Mood normal.    Labs reviewed: Recent Labs    01/29/22 2306 01/30/22 1610 01/31/22 2244 02/01/22 0237 02/02/22 0801 04/10/22 1410  NA 140   < > 137  --  136 142  K 2.9*   < > 4.1  --  3.9 4.1  CL 100   < > 102  --  105 103  CO2 31   < > 28  --  22 26  GLUCOSE 128*   < > 80  --  80 95  BUN 17   < > 19  --  19 18  CREATININE 1.11*   < > 1.30*  --  1.10* 0.92  CALCIUM 9.1   < > 8.9  --  8.6* 9.6  MG 1.7  --   --  2.1  --   --   PHOS 2.4*  --   --  4.1  --   --    < > = values in this interval not displayed.   Recent Labs    04/10/22 1410  AST 24  ALT 13  ALKPHOS 76  BILITOT <0.2  PROT 6.1  ALBUMIN 3.8   Recent Labs    01/29/22 2306 01/30/22 1610 02/01/22 0237 02/02/22 0801 04/10/22 1410   WBC 10.3   < > 7.0 6.9 6.3  NEUTROABS 8.6*  --  4.2  --   --   HGB 10.9*   < > 10.3* 10.0* 10.2*  HCT 34.4*   < > 32.1* 32.4* 31.4*  MCV 92.2   < > 92.8 94.2 91  PLT 169   < > 180 187 231   < > = values in this interval not displayed.   Lab Results  Component Value Date   TSH 2.88 11/28/2021   Lab Results  Component Value Date   HGBA1C 5.2 07/09/2017   Lab Results  Component Value Date   CHOL 206 (H) 11/28/2021   HDL 68 11/28/2021   LDLCALC 115 (H) 11/28/2021   TRIG 124 11/28/2021   CHOLHDL 3.0 11/28/2021    Significant Diagnostic Results in last 30 days:  No results found.  Assessment/Plan Adult failure  to thrive  gradual weight loss, #5Ibs in the past 3 months, already lower body weight of #80s Ibs,  under hospice care. Bun/creat 19/0.76 08/29/22  GE reflux takes Pantoprazole, prn Phenergan, Hgb 10.2 04/10/22  Slow transit constipation Stable,  takes MiraLax, Colace, Prn Dulcolax suppository,   Senile dementia (HCC)  takes ASA, off Donepezil, on Namenda, CT head 05/10/22 Left frontal scalp hematoma. Chronic atrophic and ischemic changes without acute abnormality.   Osteoarthritis, multiple sites  Hx of compression fx, managed with Tramadol, Tylenol, Lidocaine patch,  off Gabapentin  COPD, severe (HCC)  hypoxia, chronic respiratory failure, followed by Pulmonology, takes Budesonide inh, DuoNeb,  O2, Flonase  Anxiety  takes Lexapro, Trazodone, Mirtazapine, prn Alprazolam. Na 138 08/29/22, prn Lorazepam available to her  Dysphagia Choked on sandwich today, will downgrade diet as needed.      Family/ staff Communication: plan of care reviewed with the patient and charge nurse.   Labs/tests ordered:  none  Time spend 35 minutes.

## 2023-01-23 NOTE — Assessment & Plan Note (Signed)
takes Lexapro, Trazodone, Mirtazapine, prn Alprazolam. Na 138 08/29/22, prn Lorazepam available to her

## 2023-01-23 NOTE — Assessment & Plan Note (Signed)
Hx of compression fx, managed with Tramadol, Tylenol, Lidocaine patch,  off Gabapentin 

## 2023-01-23 NOTE — Assessment & Plan Note (Signed)
takes Pantoprazole, prn Phenergan, Hgb 10.2 04/10/22 

## 2023-01-23 NOTE — Assessment & Plan Note (Signed)
gradual weight loss, #5Ibs in the past 3 months, already lower body weight of #80s Ibs,  under hospice care. Bun/creat 19/0.76 08/29/22

## 2023-01-23 NOTE — Assessment & Plan Note (Signed)
Choked on sandwich today, will downgrade diet as needed.

## 2023-01-23 NOTE — Assessment & Plan Note (Signed)
Stable,  takes MiraLax, Colace, Prn Dulcolax suppository,  

## 2023-01-28 ENCOUNTER — Telehealth: Payer: Medicare PPO

## 2023-01-28 NOTE — Telephone Encounter (Signed)
[name] called stating that death certificate for patient needs to be signed. Cremation is planned for Sunday. Need it signed ASAP.

## 2023-01-29 NOTE — Telephone Encounter (Signed)
Funeral home called again regarding death certificate. They need it signed ASAP.  Death certificate is in [place]  Message has been sent to Man Mast, NP (if willing to sign)

## 2023-01-29 NOTE — Telephone Encounter (Signed)
Thanks

## 2023-02-15 DEATH — deceased

## 2023-02-17 ENCOUNTER — Telehealth: Payer: Self-pay | Admitting: Nurse Practitioner

## 2023-02-17 NOTE — Telephone Encounter (Signed)
Patient's husband is calling because he has nebulizer that his wife used to use and he's not sure what to do with them. He would like a call from [place],NP to tell him what to do with them. [phone]

## 2023-02-19 NOTE — Telephone Encounter (Signed)
[employer] accepts donations of gently used equipment.  [phone]   Attempted to contact caller. No answer. Voicemail left requesting return call.

## 2023-02-23 NOTE — Telephone Encounter (Signed)
Detailed message left on identifiable machine belonging to the patient. Advised to call with any questions or concerns.
